# Patient Record
Sex: Female | Born: 1950 | Race: Black or African American | Hispanic: No | Marital: Married | Smoking: Former smoker
Health system: Southern US, Community
[De-identification: ages and names within clinical notes are randomized; demographics above are authoritative.]

## PROBLEM LIST (undated history)

## (undated) DIAGNOSIS — Z8619 Personal history of other infectious and parasitic diseases: Secondary | ICD-10-CM

## (undated) DIAGNOSIS — K759 Inflammatory liver disease, unspecified: Secondary | ICD-10-CM

## (undated) DIAGNOSIS — Z8679 Personal history of other diseases of the circulatory system: Secondary | ICD-10-CM

## (undated) DIAGNOSIS — N320 Bladder-neck obstruction: Secondary | ICD-10-CM

## (undated) DIAGNOSIS — B182 Chronic viral hepatitis C: Secondary | ICD-10-CM

## (undated) DIAGNOSIS — K08109 Complete loss of teeth, unspecified cause, unspecified class: Secondary | ICD-10-CM

## (undated) DIAGNOSIS — I2510 Atherosclerotic heart disease of native coronary artery without angina pectoris: Secondary | ICD-10-CM

## (undated) DIAGNOSIS — E785 Hyperlipidemia, unspecified: Secondary | ICD-10-CM

## (undated) DIAGNOSIS — D509 Iron deficiency anemia, unspecified: Secondary | ICD-10-CM

## (undated) DIAGNOSIS — I739 Peripheral vascular disease, unspecified: Secondary | ICD-10-CM

## (undated) DIAGNOSIS — Z789 Other specified health status: Secondary | ICD-10-CM

## (undated) DIAGNOSIS — M1990 Unspecified osteoarthritis, unspecified site: Secondary | ICD-10-CM

## (undated) DIAGNOSIS — N3281 Overactive bladder: Secondary | ICD-10-CM

## (undated) DIAGNOSIS — E119 Type 2 diabetes mellitus without complications: Secondary | ICD-10-CM

## (undated) DIAGNOSIS — K219 Gastro-esophageal reflux disease without esophagitis: Secondary | ICD-10-CM

## (undated) DIAGNOSIS — N3289 Other specified disorders of bladder: Secondary | ICD-10-CM

## (undated) DIAGNOSIS — N952 Postmenopausal atrophic vaginitis: Secondary | ICD-10-CM

## (undated) DIAGNOSIS — I6529 Occlusion and stenosis of unspecified carotid artery: Secondary | ICD-10-CM

## (undated) DIAGNOSIS — K7460 Unspecified cirrhosis of liver: Secondary | ICD-10-CM

## (undated) DIAGNOSIS — D259 Leiomyoma of uterus, unspecified: Secondary | ICD-10-CM

## (undated) DIAGNOSIS — R32 Unspecified urinary incontinence: Secondary | ICD-10-CM

## (undated) DIAGNOSIS — I8500 Esophageal varices without bleeding: Secondary | ICD-10-CM

## (undated) DIAGNOSIS — I6523 Occlusion and stenosis of bilateral carotid arteries: Secondary | ICD-10-CM

## (undated) DIAGNOSIS — I10 Essential (primary) hypertension: Secondary | ICD-10-CM

## (undated) DIAGNOSIS — Z5189 Encounter for other specified aftercare: Secondary | ICD-10-CM

## (undated) DIAGNOSIS — I639 Cerebral infarction, unspecified: Secondary | ICD-10-CM

## (undated) DIAGNOSIS — D696 Thrombocytopenia, unspecified: Secondary | ICD-10-CM

## (undated) DIAGNOSIS — D649 Anemia, unspecified: Secondary | ICD-10-CM

## (undated) HISTORY — DX: Gastro-esophageal reflux disease without esophagitis: K21.9

## (undated) HISTORY — DX: Unspecified urinary incontinence: R32

## (undated) HISTORY — DX: Anemia, unspecified: D64.9

## (undated) HISTORY — DX: Leiomyoma of uterus, unspecified: D25.9

## (undated) HISTORY — DX: Thrombocytopenia, unspecified: D69.6

## (undated) HISTORY — DX: Unspecified cirrhosis of liver: K74.60

## (undated) HISTORY — PX: COLONOSCOPY: SHX174

## (undated) HISTORY — DX: Cerebral infarction, unspecified: I63.9

## (undated) HISTORY — DX: Hyperlipidemia, unspecified: E78.5

## (undated) HISTORY — PX: UPPER GASTROINTESTINAL ENDOSCOPY: SHX188

## (undated) HISTORY — DX: Encounter for other specified aftercare: Z51.89

## (undated) HISTORY — DX: Occlusion and stenosis of unspecified carotid artery: I65.29

---

## 1997-06-07 HISTORY — PX: ANKLE FRACTURE SURGERY: SHX122

## 2002-06-09 ENCOUNTER — Ambulatory Visit (HOSPITAL_COMMUNITY): Admission: RE | Admit: 2002-06-09 | Discharge: 2002-06-09 | Payer: Self-pay

## 2002-08-22 ENCOUNTER — Ambulatory Visit (HOSPITAL_COMMUNITY): Admission: RE | Admit: 2002-08-22 | Discharge: 2002-08-22 | Payer: Self-pay | Admitting: Family Medicine

## 2005-01-14 ENCOUNTER — Ambulatory Visit: Payer: Self-pay | Admitting: Nurse Practitioner

## 2005-03-01 ENCOUNTER — Ambulatory Visit: Payer: Self-pay | Admitting: Nurse Practitioner

## 2005-03-16 ENCOUNTER — Ambulatory Visit: Payer: Self-pay | Admitting: *Deleted

## 2005-06-10 ENCOUNTER — Ambulatory Visit: Payer: Self-pay | Admitting: Nurse Practitioner

## 2005-06-10 ENCOUNTER — Other Ambulatory Visit: Admission: RE | Admit: 2005-06-10 | Discharge: 2005-06-10 | Payer: Self-pay | Admitting: Family Medicine

## 2005-06-10 ENCOUNTER — Encounter (INDEPENDENT_AMBULATORY_CARE_PROVIDER_SITE_OTHER): Payer: Self-pay | Admitting: *Deleted

## 2005-06-23 ENCOUNTER — Ambulatory Visit: Payer: Self-pay | Admitting: Nurse Practitioner

## 2006-08-01 ENCOUNTER — Ambulatory Visit: Payer: Self-pay | Admitting: Family Medicine

## 2006-08-18 ENCOUNTER — Ambulatory Visit: Payer: Self-pay | Admitting: Family Medicine

## 2006-09-01 ENCOUNTER — Ambulatory Visit: Payer: Self-pay | Admitting: Family Medicine

## 2006-10-19 ENCOUNTER — Ambulatory Visit: Payer: Self-pay | Admitting: Nurse Practitioner

## 2006-12-20 ENCOUNTER — Ambulatory Visit: Payer: Self-pay | Admitting: Family Medicine

## 2007-03-02 ENCOUNTER — Ambulatory Visit: Payer: Self-pay | Admitting: Nurse Practitioner

## 2007-03-02 ENCOUNTER — Encounter: Payer: Self-pay | Admitting: Internal Medicine

## 2007-03-02 LAB — CONVERTED CEMR LAB
ALT: 38 units/L — ABNORMAL HIGH (ref 0–35)
AST: 38 units/L — ABNORMAL HIGH (ref 0–37)
Albumin: 4.3 g/dL (ref 3.5–5.2)
Alkaline Phosphatase: 80 units/L (ref 39–117)
BUN: 22 mg/dL (ref 6–23)
Basophils Absolute: 0 10*3/uL (ref 0.0–0.1)
Basophils Relative: 0 % (ref 0–1)
CO2: 24 meq/L (ref 19–32)
Calcium: 9.7 mg/dL (ref 8.4–10.5)
Chloride: 105 meq/L (ref 96–112)
Creatinine, Ser: 0.79 mg/dL (ref 0.40–1.20)
Eosinophils Absolute: 0 10*3/uL (ref 0.0–0.7)
Eosinophils Relative: 1 % (ref 0–5)
Glucose, Bld: 112 mg/dL — ABNORMAL HIGH (ref 70–99)
HCT: 37.4 % (ref 36.0–46.0)
Hemoglobin: 12 g/dL (ref 12.0–15.0)
Lymphocytes Relative: 37 % (ref 12–46)
Lymphs Abs: 2.6 10*3/uL (ref 0.7–3.3)
MCHC: 32.1 g/dL (ref 30.0–36.0)
MCV: 71.1 fL — ABNORMAL LOW (ref 78.0–100.0)
Monocytes Absolute: 0.6 10*3/uL (ref 0.2–0.7)
Monocytes Relative: 9 % (ref 3–11)
Neutro Abs: 3.6 10*3/uL (ref 1.7–7.7)
Neutrophils Relative %: 53 % (ref 43–77)
Platelets: 203 10*3/uL (ref 150–400)
Potassium: 3.9 meq/L (ref 3.5–5.3)
RBC: 5.26 M/uL — ABNORMAL HIGH (ref 3.87–5.11)
RDW: 15.3 % — ABNORMAL HIGH (ref 11.5–14.0)
Sodium: 140 meq/L (ref 135–145)
Total Bilirubin: 0.7 mg/dL (ref 0.3–1.2)
Total Protein: 7.9 g/dL (ref 6.0–8.3)
WBC: 6.9 10*3/uL (ref 4.0–10.5)

## 2007-04-19 ENCOUNTER — Ambulatory Visit: Payer: Self-pay | Admitting: Family Medicine

## 2007-05-22 ENCOUNTER — Ambulatory Visit: Payer: Self-pay | Admitting: Internal Medicine

## 2007-08-01 ENCOUNTER — Ambulatory Visit: Payer: Self-pay | Admitting: Family Medicine

## 2007-08-01 ENCOUNTER — Encounter (INDEPENDENT_AMBULATORY_CARE_PROVIDER_SITE_OTHER): Payer: Self-pay | Admitting: Nurse Practitioner

## 2007-08-01 LAB — CONVERTED CEMR LAB
ALT: 31 units/L (ref 0–35)
AST: 26 units/L (ref 0–37)
Albumin: 4.3 g/dL (ref 3.5–5.2)
Alkaline Phosphatase: 71 units/L (ref 39–117)
BUN: 15 mg/dL (ref 6–23)
CO2: 24 meq/L (ref 19–32)
Calcium: 9.4 mg/dL (ref 8.4–10.5)
Chloride: 106 meq/L (ref 96–112)
Creatinine, Ser: 0.8 mg/dL (ref 0.40–1.20)
Glucose, Bld: 86 mg/dL (ref 70–99)
Potassium: 3.8 meq/L (ref 3.5–5.3)
RPR Ser Ql: REACTIVE — AB
RPR Titer: 1:8 {titer}
Sodium: 141 meq/L (ref 135–145)
Total Bilirubin: 0.6 mg/dL (ref 0.3–1.2)
Total Protein: 7.4 g/dL (ref 6.0–8.3)

## 2007-08-10 ENCOUNTER — Ambulatory Visit: Payer: Self-pay | Admitting: Internal Medicine

## 2007-08-14 ENCOUNTER — Ambulatory Visit: Payer: Self-pay | Admitting: Internal Medicine

## 2007-08-20 ENCOUNTER — Emergency Department (HOSPITAL_COMMUNITY): Admission: EM | Admit: 2007-08-20 | Discharge: 2007-08-20 | Payer: Self-pay | Admitting: Emergency Medicine

## 2007-08-25 ENCOUNTER — Ambulatory Visit: Payer: Self-pay | Admitting: Internal Medicine

## 2007-09-19 ENCOUNTER — Ambulatory Visit: Payer: Self-pay | Admitting: Internal Medicine

## 2007-09-19 ENCOUNTER — Encounter (INDEPENDENT_AMBULATORY_CARE_PROVIDER_SITE_OTHER): Payer: Self-pay | Admitting: Nurse Practitioner

## 2007-09-25 ENCOUNTER — Ambulatory Visit: Payer: Self-pay | Admitting: Internal Medicine

## 2007-11-25 ENCOUNTER — Inpatient Hospital Stay (HOSPITAL_COMMUNITY): Admission: EM | Admit: 2007-11-25 | Discharge: 2007-11-28 | Payer: Self-pay | Admitting: Emergency Medicine

## 2007-11-25 HISTORY — PX: ORIF ANKLE FRACTURE: SUR919

## 2008-04-01 ENCOUNTER — Ambulatory Visit: Payer: Self-pay | Admitting: Internal Medicine

## 2008-04-18 ENCOUNTER — Ambulatory Visit: Payer: Self-pay | Admitting: Internal Medicine

## 2008-05-18 ENCOUNTER — Emergency Department (HOSPITAL_COMMUNITY): Admission: EM | Admit: 2008-05-18 | Discharge: 2008-05-18 | Payer: Self-pay | Admitting: Emergency Medicine

## 2008-06-28 ENCOUNTER — Encounter: Admission: RE | Admit: 2008-06-28 | Discharge: 2008-06-28 | Payer: Self-pay | Admitting: Chiropractic Medicine

## 2008-07-11 ENCOUNTER — Ambulatory Visit: Payer: Self-pay | Admitting: Internal Medicine

## 2008-07-11 ENCOUNTER — Encounter (INDEPENDENT_AMBULATORY_CARE_PROVIDER_SITE_OTHER): Payer: Self-pay | Admitting: Internal Medicine

## 2008-07-11 LAB — CONVERTED CEMR LAB
ALT: 52 units/L — ABNORMAL HIGH (ref 0–35)
AST: 73 units/L — ABNORMAL HIGH (ref 0–37)
Albumin: 4.4 g/dL (ref 3.5–5.2)
Alkaline Phosphatase: 86 units/L (ref 39–117)
BUN: 12 mg/dL (ref 6–23)
CO2: 22 meq/L (ref 19–32)
Calcium: 9.5 mg/dL (ref 8.4–10.5)
Chloride: 106 meq/L (ref 96–112)
Creatinine, Ser: 0.82 mg/dL (ref 0.40–1.20)
Glucose, Bld: 91 mg/dL (ref 70–99)
Potassium: 4.3 meq/L (ref 3.5–5.3)
Sodium: 143 meq/L (ref 135–145)
Total Bilirubin: 0.9 mg/dL (ref 0.3–1.2)
Total Protein: 8.5 g/dL — ABNORMAL HIGH (ref 6.0–8.3)

## 2008-07-17 ENCOUNTER — Encounter (INDEPENDENT_AMBULATORY_CARE_PROVIDER_SITE_OTHER): Payer: Self-pay | Admitting: Internal Medicine

## 2008-07-17 LAB — CONVERTED CEMR LAB
HCV Ab: REACTIVE — AB
Hep A Total Ab: NEGATIVE
Hep B Core Total Ab: NEGATIVE
Hep B E Ab: NEGATIVE
Hep B S Ab: NEGATIVE

## 2008-07-30 ENCOUNTER — Ambulatory Visit: Payer: Self-pay | Admitting: Internal Medicine

## 2008-07-30 LAB — CONVERTED CEMR LAB
RPR Ser Ql: REACTIVE — AB
RPR Titer: 1:2 {titer}

## 2008-08-06 ENCOUNTER — Ambulatory Visit (HOSPITAL_COMMUNITY): Admission: RE | Admit: 2008-08-06 | Discharge: 2008-08-06 | Payer: Self-pay | Admitting: Internal Medicine

## 2008-08-28 ENCOUNTER — Ambulatory Visit: Payer: Self-pay | Admitting: Internal Medicine

## 2008-10-09 ENCOUNTER — Ambulatory Visit (HOSPITAL_COMMUNITY): Admission: RE | Admit: 2008-10-09 | Discharge: 2008-10-09 | Payer: Self-pay | Admitting: Internal Medicine

## 2008-11-19 ENCOUNTER — Ambulatory Visit: Payer: Self-pay | Admitting: Family Medicine

## 2008-11-27 ENCOUNTER — Ambulatory Visit: Payer: Self-pay | Admitting: Internal Medicine

## 2009-01-13 ENCOUNTER — Encounter: Admission: RE | Admit: 2009-01-13 | Discharge: 2009-02-26 | Payer: Self-pay | Admitting: Internal Medicine

## 2009-02-06 ENCOUNTER — Telehealth (INDEPENDENT_AMBULATORY_CARE_PROVIDER_SITE_OTHER): Payer: Self-pay | Admitting: *Deleted

## 2009-02-13 ENCOUNTER — Ambulatory Visit: Payer: Self-pay | Admitting: Internal Medicine

## 2009-02-14 ENCOUNTER — Encounter (INDEPENDENT_AMBULATORY_CARE_PROVIDER_SITE_OTHER): Payer: Self-pay | Admitting: Adult Health

## 2009-02-24 ENCOUNTER — Telehealth (INDEPENDENT_AMBULATORY_CARE_PROVIDER_SITE_OTHER): Payer: Self-pay | Admitting: *Deleted

## 2009-02-26 ENCOUNTER — Emergency Department (HOSPITAL_COMMUNITY): Admission: EM | Admit: 2009-02-26 | Discharge: 2009-02-26 | Payer: Self-pay | Admitting: Emergency Medicine

## 2009-02-27 ENCOUNTER — Ambulatory Visit: Payer: Self-pay | Admitting: Internal Medicine

## 2009-02-28 ENCOUNTER — Encounter (INDEPENDENT_AMBULATORY_CARE_PROVIDER_SITE_OTHER): Payer: Self-pay | Admitting: Internal Medicine

## 2009-05-29 ENCOUNTER — Other Ambulatory Visit: Admission: RE | Admit: 2009-05-29 | Discharge: 2009-05-29 | Payer: Self-pay | Admitting: Family Medicine

## 2009-05-29 ENCOUNTER — Ambulatory Visit: Payer: Self-pay | Admitting: Family Medicine

## 2009-05-30 ENCOUNTER — Encounter (INDEPENDENT_AMBULATORY_CARE_PROVIDER_SITE_OTHER): Payer: Self-pay | Admitting: Family Medicine

## 2009-05-31 ENCOUNTER — Emergency Department (HOSPITAL_COMMUNITY): Admission: EM | Admit: 2009-05-31 | Discharge: 2009-05-31 | Payer: Self-pay | Admitting: Emergency Medicine

## 2009-09-30 ENCOUNTER — Emergency Department (HOSPITAL_COMMUNITY): Admission: EM | Admit: 2009-09-30 | Discharge: 2009-09-30 | Payer: Self-pay | Admitting: Emergency Medicine

## 2010-05-06 ENCOUNTER — Encounter (INDEPENDENT_AMBULATORY_CARE_PROVIDER_SITE_OTHER): Payer: Self-pay | Admitting: Family Medicine

## 2010-07-07 NOTE — Progress Notes (Signed)
Summary: triage-vaginal itching  Phone Note Call from Patient   Reason for Call: Talk to Nurse Summary of Call: patient in acute visit last week with vaginal discharge burning...provider instructions f/u 1 week if not improved...patient states she has finished her medications and still burning and itching with increased discharge...patient scheduled today with Fannie Knee Drinkard...patient states she has culture and sensitivity done on urine... Initial call taken by: Conchita Paris,  February 24, 2009 10:04 AM

## 2010-07-07 NOTE — Progress Notes (Signed)
Summary: vaginal infection/UTI  Phone Note Call from Patient   Caller: Patient Reason for Call: Talk to Nurse Summary of Call: patient left message on voice mail aout 30 minutes ago....states thinks she has vaginal infection and also burning with urination...patient states she wants to come in.[name] KitchenMarland KitchenMarland KitchenI called back a woman answered the phone saying Nyesha is not there. I asked to have her call me back....the number she left to call was [phone] Initial call taken by: Conchita Paris,  February 06, 2009 10:45 AM  Follow-up for Phone Call        Patient states burning with urination and white vaginal discharge...no blood seen... Follow-up by: Conchita Paris,  February 06, 2009 5:07 PM

## 2010-08-25 LAB — URINE MICROSCOPIC-ADD ON

## 2010-08-25 LAB — URINALYSIS, ROUTINE W REFLEX MICROSCOPIC
Bilirubin Urine: NEGATIVE
Glucose, UA: NEGATIVE mg/dL
Ketones, ur: NEGATIVE mg/dL
Nitrite: NEGATIVE
Protein, ur: 100 mg/dL — AB
Specific Gravity, Urine: 1.02 (ref 1.005–1.030)
Urobilinogen, UA: 1 mg/dL (ref 0.0–1.0)
pH: 5.5 (ref 5.0–8.0)

## 2010-09-07 LAB — URINE MICROSCOPIC-ADD ON

## 2010-09-07 LAB — URINALYSIS, ROUTINE W REFLEX MICROSCOPIC
Bilirubin Urine: NEGATIVE
Glucose, UA: NEGATIVE mg/dL
Ketones, ur: NEGATIVE mg/dL
Nitrite: NEGATIVE
Protein, ur: >300 mg/dL — AB
Specific Gravity, Urine: 1.027 (ref 1.005–1.030)
Urobilinogen, UA: 1 mg/dL (ref 0.0–1.0)
pH: 6.5 (ref 5.0–8.0)

## 2010-09-26 ENCOUNTER — Emergency Department (HOSPITAL_COMMUNITY): Payer: Medicaid Other

## 2010-09-26 ENCOUNTER — Emergency Department (HOSPITAL_COMMUNITY)
Admission: EM | Admit: 2010-09-26 | Discharge: 2010-09-26 | Disposition: A | Discharge status: Home / Self Care | Payer: Medicaid Other | Attending: Emergency Medicine | Admitting: Emergency Medicine

## 2010-09-26 DIAGNOSIS — Z79899 Other long term (current) drug therapy: Secondary | ICD-10-CM | POA: Insufficient documentation

## 2010-09-26 DIAGNOSIS — I10 Essential (primary) hypertension: Secondary | ICD-10-CM | POA: Insufficient documentation

## 2010-09-26 DIAGNOSIS — M25579 Pain in unspecified ankle and joints of unspecified foot: Secondary | ICD-10-CM | POA: Insufficient documentation

## 2010-09-26 DIAGNOSIS — M545 Low back pain, unspecified: Secondary | ICD-10-CM | POA: Insufficient documentation

## 2010-09-27 ENCOUNTER — Inpatient Hospital Stay (INDEPENDENT_AMBULATORY_CARE_PROVIDER_SITE_OTHER)
Admission: RE | Admit: 2010-09-27 | Discharge: 2010-09-27 | Disposition: A | Discharge status: Home / Self Care | Payer: Medicaid Other | Source: Ambulatory Visit | Attending: Family Medicine | Admitting: Family Medicine

## 2010-09-27 DIAGNOSIS — M62838 Other muscle spasm: Secondary | ICD-10-CM

## 2010-09-27 DIAGNOSIS — M461 Sacroiliitis, not elsewhere classified: Secondary | ICD-10-CM

## 2010-09-27 LAB — POCT URINALYSIS DIP (DEVICE)
Glucose, UA: NEGATIVE mg/dL
Hgb urine dipstick: NEGATIVE
Ketones, ur: NEGATIVE mg/dL
Nitrite: NEGATIVE
Protein, ur: 30 mg/dL — AB
Specific Gravity, Urine: 1.015 (ref 1.005–1.030)
Urobilinogen, UA: 2 mg/dL — ABNORMAL HIGH (ref 0.0–1.0)
pH: 7 (ref 5.0–8.0)

## 2010-10-20 NOTE — H&P (Signed)
NAMEALLANNA, BRESEE NO.:  [id number]   MEDICAL RECORD NO.:  [id number]          PATIENT TYPE:  INP   LOCATION:  5008                         FACILITY:  [facility]   PHYSICIAN:  Eulas Post, MD    DATE OF BIRTH:  [birth year]   DATE OF ADMISSION:  11/25/2007  DATE OF DISCHARGE:                              HISTORY & PHYSICAL   CHIEF COMPLAINTS:  Right ankle pain.   HISTORY:  Deborah Wise is a 60 year old woman who twisted and fell  while stepping off the curve and felt a pop and severe ankle pain on the  right side.  She denies other injuries.  She rates the pain as a 1/10.  She cannot bear weight.  She denies associated other problems.  She says  that a number of years ago she twisted both of her ankles and had pain,  but never went to a doctor and things seemed to get better.  She noted  acute deformity as well as swelling at the ankle on today's episode.  The date of her injury was November 25, 2007.   PAST MEDICAL HISTORY:  She denies any other medical problems.   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   FAMILY HISTORY:  Positive for diabetes in her mother.   REVIEW OF SYSTEMS:  Complete review of systems was performed and was  otherwise negative except for the musculoskeletal complaints as above.   PHYSICAL EXAMINATION:  GENERAL:  She is sitting on a gurney, in no acute  distress.  HEENT:  Her head is normocephalic and atraumatic.  NECK:  She has full range of motion with no radiculopathy and a midline  trachea.  She has no masses.  CARDIOVASCULAR:  She has good peripheral pulses and no edema in her left  leg.  RESPIRATORY:  She is clear to auscultation and she does not have any  cyanosis.  GI:  She has no organomegaly.  LYMPHATIC:  Her neck and axilla are without lymphadenopathy.  PSYCHIATRIC:  Her mood and judgment are appropriate and her insight  seems to be intact.  NEUROLOGIC:  Sensation is intact throughout her  foot.  SKIN:  She has no skin breaks around the right lower extremity.  MUSCULOSKELETAL:  She has swelling and moderate deformity of the right  lower extremity.  She has ecchymosis.  She cannot bear weight.  Right  upper extremity and left upper extremity and left lower extremity are  atraumatic.   Her x-rays demonstrate evidence for an extra-articular distal tibia  spiral oblique fracture with a fibular fracture that is also spiral and  oblique, but down more towards the mortise.  There is also a posterior  malleolus fracture that I believe is separate from the distal tibia  fracture that has minimal displacement.  Overall, the fracture is  shortened and has moderate displacement.   IMPRESSION:  1. Distal-third spiral oblique extra-articular tibia fracture.  2. Distal fibula fracture.  3. Posterior malleolus fracture.   PLAN:  Given the presence of the ankle fracture as well as the degree of  shortening, I am reluctant to recommend closed treatment for her complex  distal leg fracture.  Therefore, I have recommended open reduction and  internal fixation of the fibula.  The posterior malleolus piece is  relatively small and will not likely need fixation.  We will have to  reassess this at the time after the fibula has been fixed.  The tibia  fracture I think would be best treated with an intramedullary nail.  I  an concerned given the degree of soft tissue trauma that will be  incurred by the fibula ORIF, and I am elected to make a second incision  to plate the distal tibia, however, if need be I certainly will do that.  My first plan, however, would be to treat her tibia with an  intramedullary nail as this would preserve her soft tissue distally in  the best way possible.  I will plan to proceed to the operating room as  soon as possible and she is currently n.p.o.  The risks, benefits, and  alternatives including the possibility of cast treatment were discussed  with her, and she  is willing to proceed.  We will plan to admit her for  urgent surgery.      Eulas Post, MD  Electronically Signed     JPL/MEDQ  D:  11/25/2007  T:  11/26/2007  Job:  [id number]

## 2010-10-20 NOTE — Op Note (Signed)
Deborah Wise, Deborah Wise NO.:  [id number]   MEDICAL RECORD NO.:  [id number]          PATIENT TYPE:  INP   LOCATION:  5008                         FACILITY:  [facility]   PHYSICIAN:  Eulas Post, MD    DATE OF BIRTH:  [birth year]   DATE OF PROCEDURE:  11/25/2007  DATE OF DISCHARGE:  08/20/2007                               OPERATIVE REPORT   ATTENDING PHYSICIAN:  Eulas Post, MD   ASSISTANT:  None.   PREOPERATIVE DIAGNOSES:  1. Right tibia fracture.  2. His right distal fibula fracture with a posterior malleolus ankle      fracture.   OPERATIVE PROCEDURE:  1. Right tibia intramedullary nailing.  2. Right open reduction and internal fixation of bimalleolar ankle      fracture.   ANESTHESIA:  General.   TOURNIQUET TIME:  2 hours.   ANTIBIOTICS:  1 gram intravenous Ancef.   OPERATIVE IMPLANTS:  We used a Synthes one-third tubular plate size 6-  hole with 2 proximal bicortical screws, with 1 interfragmentary lag  screw and 3 distal cancellous screws.  We also used a size 300 and 30 x  9 mm tibial nail Synthes EX with 1 proximal static screw, 1 proximal  dynamic screw and 2 distal locking screws, 1 bicortically through the  distal segment and 1 across the fracture site catching both the distal  and proximal segment with the screw   PREOPERATIVE INDICATIONS:  Deborah Wise is a 60 year old woman  who had a low velocity twisting type injury and broke her ankle as well  as her distal third tibia.  She elected to undergo the above named  procedures.  The risks, benefits and alternatives were discussed with  her preoperatively including, but not limited to risks of infection,  bleeding, nerve injury, malunion, nonunion, hardware prominence,  hardware failure, need for hardware removal, postoperative knee pain,  malunion, rotational malalignment, blood clots, cardiopulmonary  complications, among others and she is willing to proceed.  We  specifically  discussed cast treatment versus operative treatment and  elected for operative treatment.  I felt that given that she had an  ankle fracture in combination with the tibia fracture that she had a  unstable extremity and operative fixation would greatly benefit her  postoperative recovery and reduce the risks of subsequent extremity  problems.   OPERATIVE PROCEDURE:  The patient was brought to the operating room, and  placed in supine position.  General anesthesia was administered.  1 gram  intravenous Ancef was given.  The right lower extremity was prepped and  draped in the usual sterile fashion.  The leg was elevated,  exsanguinated, and tourniquet was inflated.  Lateral approach to the  fibula was performed.  Anatomic reduction was held with clamps.  We then  placed a lag screw from front to back with excellent fixation.  We then  placed our plate and had excellent fixation.  The bone quality was  overall reasonable although not grade, but it was okay.  Once  satisfactory position and stability of the fibula was achieved, we then  turned our attention  to the tibia.   Incision was made over the patella and a medial parapatellar approach  was performed.  Guidepin was placed and confirmed under fluoroscopy.  The proximal tibia was opened.  We then passed a guidewire down and  reduce the fracture anatomically and then reamed sequentially to a 10.  Excellent chatter was achieved.  We selected a 9.  The appropriate  length was chosen.  We then placed the nail down and confirmed final  position under AP and lateral views.  We secured our nail proximally and  then distally as well.  Because of the oblique nature the fracture, we  did not apply compression.  We held the fracture reduced with a clamp  while all of the procedure was performed.  This was done percutaneously.  We then interlocked our screws distally from medial to lateral.  We  selected both medial and lateral screws, and the most  proximal screw was  actually crossing the fracture site and provided an interfragmentary  fixation which would help control with rotation as well as provide added  stability across the fracture site.  We had considered placing an  anterior to posterior screw; however, because of the nature the  fracture, this would also only have given a unicortical fixation at the  anterior cortex, and therefore would not provide added benefit.  Therefore, we maintained our distal fixation with the medial collateral  screws.   All the wounds were irrigated copiously and the deep tissue closed with  0 Vicryl followed by 3-0 followed by Monocryl and Steri-Strips for the  skin.  The patient was awakened and returned to PACU in stable  satisfactory condition.  The posterior splint was also applied and final  full length x-rays were taken in the room.  The patient tolerated the  procedure well.  No known complications.      Eulas Post, MD  Electronically Signed     JPL/MEDQ  D:  11/25/2007  T:  11/26/2007  Job:  [id number]

## 2010-10-20 NOTE — Discharge Summary (Signed)
NAMEERCELL, PERLMAN NO.:  [id number]   MEDICAL RECORD NO.:  [id number]          PATIENT TYPE:  INP   LOCATION:  5008                         FACILITY:  [facility]   PHYSICIAN:  Eulas Post, MD    DATE OF BIRTH:  Jan 11, 1951   DATE OF ADMISSION:  11/25/2007  DATE OF DISCHARGE:  11/28/2007                               DISCHARGE SUMMARY   ADMISSION DIAGNOSES:  Right tibia and fibula fracture.   DISCHARGE DIAGNOSES:  1. Right tibia and fibula fracture  2. Bimalleolar ankle fracture.   PRIMARY PROCEDURE:  Right tibia intramedullary nailing and right fibula  open reduction and internal fixation.   DISCHARGE MEDICATIONS:  1. Aspirin 325 mg p.o. daily for 30 days.  2. Percocet.  3. Valium.  4. Phenergan.   HOSPITAL COURSE:  Deborah Wise is a 60 year old woman who had a  twisting injury and broke her tibia and had a bimalleolar ankle  fracture.  She underwent open reduction and internal fixation and  intramedullary nailing.  She tolerated the procedure well and  postoperatively, had no complications.  She was given Lovenox while in  the hospital as well as compression devices for DVT prophylaxis.  She  was given perioperative antibiotics.  She was given a PCA and  subsequently p.o. analgesics.  She worked with physical therapy and was  making good progress.  She is planned to be discharge home, non-  weightbearing on the right lower extremity, and we will see her back in  2 weeks and get new x-rays out of plaster.  She has done quite well.      Eulas Post, MD  Electronically Signed     JPL/MEDQ  D:  11/28/2007  T:  11/28/2007  Job:  [id number]

## 2011-03-04 LAB — CBC
HCT: 29.8 — ABNORMAL LOW
HCT: 30.6 — ABNORMAL LOW
HCT: 31.5 — ABNORMAL LOW
Hemoglobin: 10.2 — ABNORMAL LOW
Hemoglobin: 9.6 — ABNORMAL LOW
Hemoglobin: 9.8 — ABNORMAL LOW
MCHC: 32
MCHC: 32.2
MCHC: 32.3
MCV: 73.4 — ABNORMAL LOW
MCV: 73.5 — ABNORMAL LOW
MCV: 73.5 — ABNORMAL LOW
Platelets: 143 — ABNORMAL LOW
Platelets: 152
Platelets: 156
RBC: 4.05
RBC: 4.16
RBC: 4.29
RDW: 15.4
RDW: 15.4
RDW: 15.5
WBC: 5
WBC: 6.3
WBC: 6.6

## 2011-03-04 LAB — BASIC METABOLIC PANEL
BUN: 2 — ABNORMAL LOW
BUN: 4 — ABNORMAL LOW
BUN: 8
CO2: 29
CO2: 31
CO2: 31
Calcium: 8.6
Calcium: 8.9
Calcium: 9.2
Chloride: 102
Chloride: 104
Chloride: 106
Creatinine, Ser: 0.67
Creatinine, Ser: 0.69
Creatinine, Ser: 0.72
GFR calc Af Amer: >60
GFR calc Af Amer: >60
GFR calc Af Amer: >60
GFR calc non Af Amer: >60
GFR calc non Af Amer: >60
GFR calc non Af Amer: >60
Glucose, Bld: 111 — ABNORMAL HIGH
Glucose, Bld: 118 — ABNORMAL HIGH
Glucose, Bld: 94
Potassium: 3.9
Potassium: 3.9
Potassium: 4.2
Sodium: 138
Sodium: 140
Sodium: 141

## 2011-03-04 LAB — POCT I-STAT, CHEM 8
BUN: 18
Calcium, Ion: 1.13
Chloride: 110
Creatinine, Ser: 1
Glucose, Bld: 106 — ABNORMAL HIGH
HCT: 38
Hemoglobin: 12.9
Potassium: 3.8
Sodium: 143
TCO2: 22

## 2011-03-04 LAB — PROTIME-INR
INR: 0.9
INR: 0.9
INR: 0.9
Prothrombin Time: 12.6
Prothrombin Time: 12.7
Prothrombin Time: 12.8

## 2011-08-25 ENCOUNTER — Other Ambulatory Visit: Payer: Self-pay | Admitting: Emergency Medicine

## 2011-08-25 DIAGNOSIS — Z1231 Encounter for screening mammogram for malignant neoplasm of breast: Secondary | ICD-10-CM

## 2011-09-03 ENCOUNTER — Ambulatory Visit
Admission: RE | Admit: 2011-09-03 | Discharge: 2011-09-03 | Disposition: A | Discharge status: Home / Self Care | Payer: Medicaid Other | Source: Ambulatory Visit | Attending: Emergency Medicine | Admitting: Emergency Medicine

## 2011-09-03 DIAGNOSIS — Z1231 Encounter for screening mammogram for malignant neoplasm of breast: Secondary | ICD-10-CM

## 2011-09-07 ENCOUNTER — Other Ambulatory Visit: Payer: Self-pay | Admitting: Emergency Medicine

## 2011-09-07 DIAGNOSIS — R928 Other abnormal and inconclusive findings on diagnostic imaging of breast: Secondary | ICD-10-CM

## 2011-09-15 ENCOUNTER — Other Ambulatory Visit: Payer: Medicaid Other

## 2011-09-22 ENCOUNTER — Ambulatory Visit
Admission: RE | Admit: 2011-09-22 | Discharge: 2011-09-22 | Disposition: A | Discharge status: Home / Self Care | Payer: Medicaid Other | Source: Ambulatory Visit | Attending: Emergency Medicine | Admitting: Emergency Medicine

## 2011-09-22 ENCOUNTER — Other Ambulatory Visit: Payer: Self-pay | Admitting: Emergency Medicine

## 2011-09-22 DIAGNOSIS — R928 Other abnormal and inconclusive findings on diagnostic imaging of breast: Secondary | ICD-10-CM

## 2011-09-28 ENCOUNTER — Ambulatory Visit
Admission: RE | Admit: 2011-09-28 | Discharge: 2011-09-28 | Disposition: A | Discharge status: Home / Self Care | Payer: Medicaid Other | Source: Ambulatory Visit | Attending: Emergency Medicine | Admitting: Emergency Medicine

## 2011-09-28 DIAGNOSIS — R928 Other abnormal and inconclusive findings on diagnostic imaging of breast: Secondary | ICD-10-CM

## 2011-10-12 ENCOUNTER — Encounter (HOSPITAL_COMMUNITY): Payer: Self-pay | Admitting: Pharmacy Technician

## 2011-10-14 ENCOUNTER — Other Ambulatory Visit: Payer: Self-pay | Admitting: Emergency Medicine

## 2011-10-14 DIAGNOSIS — N644 Mastodynia: Secondary | ICD-10-CM

## 2011-10-19 ENCOUNTER — Encounter (HOSPITAL_COMMUNITY)
Admission: RE | Admit: 2011-10-19 | Discharge: 2011-10-19 | Disposition: A | Discharge status: Home / Self Care | Payer: Medicaid Other | Source: Ambulatory Visit | Attending: Anesthesiology | Admitting: Anesthesiology

## 2011-10-19 ENCOUNTER — Encounter (HOSPITAL_COMMUNITY)
Admission: RE | Admit: 2011-10-19 | Discharge: 2011-10-19 | Disposition: A | Discharge status: Home / Self Care | Payer: Medicaid Other | Source: Ambulatory Visit | Attending: Oral Surgery | Admitting: Oral Surgery

## 2011-10-19 ENCOUNTER — Encounter (HOSPITAL_COMMUNITY): Payer: Self-pay

## 2011-10-19 ENCOUNTER — Ambulatory Visit (HOSPITAL_COMMUNITY): Admission: RE | Admit: 2011-10-19 | Payer: Medicaid Other | Source: Ambulatory Visit

## 2011-10-19 ENCOUNTER — Other Ambulatory Visit (HOSPITAL_BASED_OUTPATIENT_CLINIC_OR_DEPARTMENT_OTHER): Payer: Self-pay | Admitting: Surgery

## 2011-10-19 HISTORY — DX: Unspecified osteoarthritis, unspecified site: M19.90

## 2011-10-19 HISTORY — DX: Inflammatory liver disease, unspecified: K75.9

## 2011-10-19 HISTORY — DX: Essential (primary) hypertension: I10

## 2011-10-19 LAB — COMPREHENSIVE METABOLIC PANEL
ALT: 99 U/L — ABNORMAL HIGH (ref 0–35)
AST: 102 U/L — ABNORMAL HIGH (ref 0–37)
Albumin: 3.4 g/dL — ABNORMAL LOW (ref 3.5–5.2)
Alkaline Phosphatase: 101 U/L (ref 39–117)
BUN: 16 mg/dL (ref 6–23)
CO2: 24 mEq/L (ref 19–32)
Calcium: 9.6 mg/dL (ref 8.4–10.5)
Chloride: 103 mEq/L (ref 96–112)
Creatinine, Ser: 0.82 mg/dL (ref 0.50–1.10)
GFR calc Af Amer: 89 mL/min — ABNORMAL LOW (ref >90)
GFR calc non Af Amer: 77 mL/min — ABNORMAL LOW (ref >90)
Glucose, Bld: 118 mg/dL — ABNORMAL HIGH (ref 70–99)
Potassium: 3.9 mEq/L (ref 3.5–5.1)
Sodium: 136 mEq/L (ref 135–145)
Total Bilirubin: 0.6 mg/dL (ref 0.3–1.2)
Total Protein: 7.9 g/dL (ref 6.0–8.3)

## 2011-10-19 LAB — CBC
HCT: 38.2 % (ref 36.0–46.0)
Hemoglobin: 12.4 g/dL (ref 12.0–15.0)
MCH: 23.9 pg — ABNORMAL LOW (ref 26.0–34.0)
MCHC: 32.5 g/dL (ref 30.0–36.0)
MCV: 73.6 fL — ABNORMAL LOW (ref 78.0–100.0)
Platelets: 149 10*3/uL — ABNORMAL LOW (ref 150–400)
RBC: 5.19 MIL/uL — ABNORMAL HIGH (ref 3.87–5.11)
RDW: 15.4 % (ref 11.5–15.5)
WBC: 7.4 10*3/uL (ref 4.0–10.5)

## 2011-10-19 NOTE — Pre-Procedure Instructions (Signed)
20 Deborah Wise  10/19/2011   Your procedure is scheduled on:  Monday Oct 25, 2011.  Report to [facility] at 0830 AM.  Call this number if you have problems the morning of surgery: [phone]   Remember:   Do not eat food:After Midnight.  May have clear liquids: up to 4 Hours before arrival until 0430 am.  Clear liquids include soda, tea, black coffee, apple or grape juice, broth.  Take these medicines the morning of surgery with A SIP OF WATER:    Do not wear jewelry, make-up or nail polish.  Do not wear lotions, powders, or perfumes. You may wear deodorant.  Do not shave 48 hours prior to surgery. Men may shave face and neck.  Do not bring valuables to the hospital.  Contacts, dentures or bridgework may not be worn into surgery.  Leave suitcase in the car. After surgery it may be brought to your room.  For patients admitted to the hospital, checkout time is 11:00 AM the day of discharge.   Patients discharged the day of surgery will not be allowed to drive home.  Name and phone number of your driver:   Special Instructions: CHG Shower Use Special Wash: 1/2 bottle night before surgery and 1/2 bottle morning of surgery.   Please read over the following fact sheets that you were given: Pain Booklet, Coughing and Deep Breathing, MRSA Information and Surgical Site Infection Prevention

## 2011-10-20 NOTE — Consult Note (Signed)
Anesthesia Chart Review:  Patient is a 61 year old female scheduled for multiple teeth extraction and alveoloplasty on 10/25/11.  History includes HTN, non-smoker, arthritis, Hepatitis C.  She was medically cleared for this procedure by Marva Panda, NP with [facility].  EKG on 10/19/11 showed SR with sinus arhythmia.  CXR from 10/19/11 showed Stable cardiomegaly. No active lung disease.  Labs noted.  AST 102, ALT 99.  PLT 149. I called her PCP office and was told that her labs in March 2013 showed a AST 125 and ALT 123, and in July 2012 her AST was 154 and ALT 116.  PT/PTT were not done at her PAT appointment. Since AST/ALT are > 2X normal will order coags for the morning of surgery.    Patient has known Hepatitis C.  LFTs have been stable for the past year.  If coags are reasonable, then plan to proceed.  Anesthesiologist Dr. Krista Blue agrees with this plan.   Shonna Chock, PA-C

## 2011-10-21 NOTE — H&P (Signed)
HISTORY AND PHYSICAL  Deborah Wise is a 61 y.o. female patient with CC: bad teeth. Desires full mouth extractions.  No diagnosis found.  Past Medical History  Diagnosis Date  . Hypertension   . Hepatitis     hep C  . Arthritis     No current facility-administered medications for this encounter.   Current Outpatient Prescriptions  Medication Sig Dispense Refill  . hydrochlorothiazide (HYDRODIURIL) 25 MG tablet Take 25 mg by mouth daily.      [name] Kitchen ibuprofen (ADVIL,MOTRIN) 200 MG tablet Take 200 mg by mouth every 6 (six) hours as needed. For pain      . nabumetone (RELAFEN) 750 MG tablet Take 750 mg by mouth 2 (two) times daily as needed. For pain       No Known Allergies Active Problems:  * No active hospital problems. *   Vitals: There were no vitals taken for this visit. Lab results:No results found for this or any previous visit (from the past 24 hour(s)). Radiology Results: No results found. General appearance: alert, cooperative and no distress Head: Normocephalic, without obvious abnormality, atraumatic Eyes: negative Ears: normal TM's and external ear canals both ears Nose: Nares normal. Septum midline. Mucosa normal. No drainage or sinus tenderness. Throat: dental caries, periodontal disease teeth #'s 1, 2, 6, 7, 8, 9, 10, 11, 12, 15, 18, 19, 20, 23, 24, 25, 27, 32. Neck: no adenopathy, no JVD and supple, symmetrical, trachea midline Resp: clear to auscultation bilaterally Cardio: regular rate and rhythm, S1, S2 normal, no murmur, click, rub or gallop  Assessment:60 YO Female HTN, Hep C, Arthritis with non-restorable teeth #'s   1, 2, 6, 7, 8, 9, 10, 11, 12, 15, 18, 19, 20, 23, 24, 25, 27, 32. Plan: Extraction teeth #'s  1, 2, 6, 7, 8, 9, 10, 11, 12, 15, 18, 19, 20, 23, 24, 25, 27, 32. Alveoloplasty. General anesthesia. Day surgery   Waldon Sheerin M 10/21/2011

## 2011-10-25 ENCOUNTER — Ambulatory Visit (HOSPITAL_COMMUNITY)
Admission: RE | Admit: 2011-10-25 | Discharge: 2011-10-25 | Disposition: A | Discharge status: Home / Self Care | Payer: Medicaid Other | Source: Ambulatory Visit | Attending: Oral Surgery | Admitting: Oral Surgery

## 2011-10-25 ENCOUNTER — Ambulatory Visit (HOSPITAL_COMMUNITY): Payer: Medicaid Other | Admitting: Vascular Surgery

## 2011-10-25 ENCOUNTER — Encounter (HOSPITAL_COMMUNITY): Payer: Self-pay | Admitting: *Deleted

## 2011-10-25 ENCOUNTER — Encounter (HOSPITAL_COMMUNITY)
Admission: RE | Disposition: A | Discharge status: Home / Self Care | Payer: Self-pay | Source: Ambulatory Visit | Attending: Oral Surgery

## 2011-10-25 ENCOUNTER — Encounter (HOSPITAL_COMMUNITY): Payer: Self-pay | Admitting: Vascular Surgery

## 2011-10-25 DIAGNOSIS — K029 Dental caries, unspecified: Secondary | ICD-10-CM

## 2011-10-25 DIAGNOSIS — K05329 Chronic periodontitis, generalized, unspecified severity: Secondary | ICD-10-CM

## 2011-10-25 DIAGNOSIS — Z01818 Encounter for other preprocedural examination: Secondary | ICD-10-CM | POA: Insufficient documentation

## 2011-10-25 DIAGNOSIS — B1920 Unspecified viral hepatitis C without hepatic coma: Secondary | ICD-10-CM | POA: Insufficient documentation

## 2011-10-25 DIAGNOSIS — Z01812 Encounter for preprocedural laboratory examination: Secondary | ICD-10-CM | POA: Insufficient documentation

## 2011-10-25 DIAGNOSIS — Z01810 Encounter for preprocedural cardiovascular examination: Secondary | ICD-10-CM | POA: Insufficient documentation

## 2011-10-25 DIAGNOSIS — M129 Arthropathy, unspecified: Secondary | ICD-10-CM | POA: Insufficient documentation

## 2011-10-25 DIAGNOSIS — I10 Essential (primary) hypertension: Secondary | ICD-10-CM | POA: Insufficient documentation

## 2011-10-25 HISTORY — PX: MULTIPLE EXTRACTIONS WITH ALVEOLOPLASTY: SHX5342

## 2011-10-25 LAB — APTT: aPTT: 32 seconds (ref 24–37)

## 2011-10-25 LAB — PROTIME-INR
INR: 0.97 (ref 0.00–1.49)
Prothrombin Time: 13.1 seconds (ref 11.6–15.2)

## 2011-10-25 SURGERY — MULTIPLE EXTRACTION WITH ALVEOLOPLASTY
Anesthesia: General | Laterality: Bilateral | Wound class: Clean Contaminated

## 2011-10-25 MED ORDER — LORAZEPAM 2 MG/ML IJ SOLN: 1.0000 mg | Freq: Once | INTRAMUSCULAR | Status: DC | PRN

## 2011-10-25 MED ORDER — CEFAZOLIN SODIUM 1-5 GM-% IV SOLN
INTRAVENOUS | Status: AC
  Filled 2011-10-25: qty 50

## 2011-10-25 MED ORDER — MIDAZOLAM HCL 5 MG/5ML IJ SOLN
INTRAMUSCULAR | Status: DC | PRN
  Administered 2011-10-25: 2 mg via INTRAVENOUS

## 2011-10-25 MED ORDER — OXYCODONE-ACETAMINOPHEN 5-325 MG PO TABS: 1.0000 | ORAL_TABLET | ORAL | Status: AC | PRN

## 2011-10-25 MED ORDER — FENTANYL CITRATE 0.05 MG/ML IJ SOLN: 50.0000 ug | INTRAMUSCULAR | Status: DC | PRN

## 2011-10-25 MED ORDER — FENTANYL CITRATE 0.05 MG/ML IJ SOLN
INTRAMUSCULAR | Status: DC | PRN
  Administered 2011-10-25: 50 ug via INTRAVENOUS
  Administered 2011-10-25 (×2): 100 ug via INTRAVENOUS

## 2011-10-25 MED ORDER — ONDANSETRON HCL 4 MG/2ML IJ SOLN
INTRAMUSCULAR | Status: DC | PRN
  Administered 2011-10-25: 4 mg via INTRAVENOUS

## 2011-10-25 MED ORDER — LACTATED RINGERS IV SOLN
INTRAVENOUS | Status: DC
  Administered 2011-10-25: 10:00:00 via INTRAVENOUS

## 2011-10-25 MED ORDER — GLYCOPYRROLATE 0.2 MG/ML IJ SOLN
INTRAMUSCULAR | Status: DC | PRN
  Administered 2011-10-25: .8 mg via INTRAVENOUS

## 2011-10-25 MED ORDER — PROPOFOL 10 MG/ML IV EMUL
INTRAVENOUS | Status: DC | PRN
  Administered 2011-10-25: 200 mg via INTRAVENOUS

## 2011-10-25 MED ORDER — LIDOCAINE-EPINEPHRINE 2 %-1:100000 IJ SOLN
INTRAMUSCULAR | Status: DC | PRN
  Administered 2011-10-25: 18 mL

## 2011-10-25 MED ORDER — MIDAZOLAM HCL 2 MG/2ML IJ SOLN: 1.0000 mg | INTRAMUSCULAR | Status: DC | PRN

## 2011-10-25 MED ORDER — LACTATED RINGERS IV SOLN
INTRAVENOUS | Status: DC | PRN
  Administered 2011-10-25 (×2): via INTRAVENOUS

## 2011-10-25 MED ORDER — ROCURONIUM BROMIDE 100 MG/10ML IV SOLN
INTRAVENOUS | Status: DC | PRN
  Administered 2011-10-25: 40 mg via INTRAVENOUS

## 2011-10-25 MED ORDER — SODIUM CHLORIDE 0.9 % IR SOLN
Status: DC | PRN
  Administered 2011-10-25: 1000 mL

## 2011-10-25 MED ORDER — NEOSTIGMINE METHYLSULFATE 1 MG/ML IJ SOLN
INTRAMUSCULAR | Status: DC | PRN
  Administered 2011-10-25: 5 mg via INTRAVENOUS

## 2011-10-25 MED ORDER — CEFAZOLIN SODIUM 1-5 GM-% IV SOLN
INTRAVENOUS | Status: DC | PRN
  Administered 2011-10-25: 1 g via INTRAVENOUS

## 2011-10-25 MED ORDER — HYDROMORPHONE HCL PF 1 MG/ML IJ SOLN: 0.2500 mg | INTRAMUSCULAR | Status: DC | PRN

## 2011-10-25 MED ORDER — PHENYLEPHRINE HCL 10 MG/ML IJ SOLN
INTRAMUSCULAR | Status: DC | PRN
  Administered 2011-10-25: 80 ug via INTRAVENOUS
  Administered 2011-10-25: 40 ug via INTRAVENOUS

## 2011-10-25 SURGICAL SUPPLY — 28 items
BUR CROSS CUT FISSURE 1.6 (BURR) ×2 IMPLANT
BUR EGG ELITE 4.0 (BURR) ×2 IMPLANT
CANISTER SUCTION 2500CC (MISCELLANEOUS) ×2 IMPLANT
CLOTH BEACON ORANGE TIMEOUT ST (SAFETY) ×2 IMPLANT
COVER SURGICAL LIGHT HANDLE (MISCELLANEOUS) ×2 IMPLANT
CRADLE DONUT ADULT HEAD (MISCELLANEOUS) ×2 IMPLANT
DECANTER SPIKE VIAL GLASS SM (MISCELLANEOUS) ×2 IMPLANT
GAUZE PACKING FOLDED 2  STR (GAUZE/BANDAGES/DRESSINGS) ×1
GAUZE PACKING FOLDED 2 STR (GAUZE/BANDAGES/DRESSINGS) ×1 IMPLANT
GLOVE BIO SURGEON STRL SZ 6.5 (GLOVE) ×2 IMPLANT
GLOVE BIO SURGEON STRL SZ7 (GLOVE) ×1 IMPLANT
GLOVE BIO SURGEON STRL SZ7.5 (GLOVE) ×2 IMPLANT
GLOVE BIOGEL PI IND STRL 7.0 (GLOVE) ×1 IMPLANT
GLOVE BIOGEL PI INDICATOR 7.0 (GLOVE) ×2
GOWN STRL NON-REIN LRG LVL3 (GOWN DISPOSABLE) ×4 IMPLANT
GOWN STRL REIN XL XLG (GOWN DISPOSABLE) ×2 IMPLANT
KIT BASIN OR (CUSTOM PROCEDURE TRAY) ×2 IMPLANT
KIT ROOM TURNOVER OR (KITS) ×2 IMPLANT
NEEDLE 22X1 1/2 (OR ONLY) (NEEDLE) ×2 IMPLANT
NS IRRIG 1000ML POUR BTL (IV SOLUTION) ×2 IMPLANT
PAD ARMBOARD 7.5X6 YLW CONV (MISCELLANEOUS) ×4 IMPLANT
SUT CHROMIC 3 0 PS 2 (SUTURE) ×3 IMPLANT
SYR 50ML SLIP (SYRINGE) ×1 IMPLANT
TOWEL OR 17X26 10 PK STRL BLUE (TOWEL DISPOSABLE) ×2 IMPLANT
TRAY ENT MC OR (CUSTOM PROCEDURE TRAY) ×2 IMPLANT
TUBING IRRIGATION (MISCELLANEOUS) ×1 IMPLANT
WATER STERILE IRR 1000ML POUR (IV SOLUTION) IMPLANT
YANKAUER SUCT BULB TIP NO VENT (SUCTIONS) ×2 IMPLANT

## 2011-10-25 NOTE — H&P (Signed)
H&P documentation  -History and Physical Reviewed  -Patient has been re-examined  -No change in the plan of care  Deborah Wise  

## 2011-10-25 NOTE — Anesthesia Preprocedure Evaluation (Addendum)
Anesthesia Evaluation  Patient identified by MRN, date of birth, ID band Patient awake    Reviewed: Allergy & Precautions, H&P , NPO status , Patient's Chart, lab work & pertinent test results  Airway Mallampati: I      Dental  (+) Poor Dentition   Pulmonary          Cardiovascular hypertension,     Neuro/Psych    GI/Hepatic (+) Hepatitis -, C  Endo/Other    Renal/GU      Musculoskeletal   Abdominal   Peds  Hematology   Anesthesia Other Findings   Reproductive/Obstetrics                         Anesthesia Physical Anesthesia Plan  ASA: III  Anesthesia Plan: General   Post-op Pain Management:    Induction: Intravenous  Airway Management Planned: Nasal ETT and Video Laryngoscope Planned  Additional Equipment:   Intra-op Plan:   Post-operative Plan: Extubation in OR  Informed Consent: I have reviewed the patients History and Physical, chart, labs and discussed the procedure including the risks, benefits and alternatives for the proposed anesthesia with the patient or authorized representative who has indicated his/her understanding and acceptance.     Plan Discussed with: CRNA and Surgeon  Anesthesia Plan Comments:         Anesthesia Quick Evaluation

## 2011-10-25 NOTE — Anesthesia Postprocedure Evaluation (Signed)
  Anesthesia Post-op Note  Patient: Deborah Wise  Procedure(s) Performed: Procedure(s) (LRB): MULTIPLE EXTRACION WITH ALVEOLOPLASTY (Bilateral)  Patient Location: PACU  Anesthesia Type: General  Level of Consciousness: awake, alert  and oriented  Airway and Oxygen Therapy: Patient Spontanous Breathing  Post-op Pain: mild  Post-op Assessment: Post-op Vital signs reviewed  Post-op Vital Signs: Reviewed  Complications: No apparent anesthesia complications

## 2011-10-25 NOTE — Transfer of Care (Signed)
Immediate Anesthesia Transfer of Care Note  Patient: Deborah Wise  Procedure(s) Performed: Procedure(s) (LRB): MULTIPLE EXTRACION WITH ALVEOLOPLASTY (Bilateral)  Patient Location: PACU  Anesthesia Type: General  Level of Consciousness: awake, alert  and patient cooperative  Airway & Oxygen Therapy: Patient Spontanous Breathing and Patient connected to face mask oxygen  Post-op Assessment: Report given to PACU RN  Post vital signs: Reviewed and stable  Complications: No apparent anesthesia complications

## 2011-10-25 NOTE — Anesthesia Procedure Notes (Signed)
Procedure Name: Intubation Date/Time: 10/25/2011 11:08 AM Performed by: Glendora Score A Pre-anesthesia Checklist: Patient identified, Emergency Drugs available, Suction available and Patient being monitored Patient Re-evaluated:Patient Re-evaluated prior to inductionOxygen Delivery Method: Circle system utilized Preoxygenation: Pre-oxygenation with 100% oxygen Intubation Type: IV induction Ventilation: Mask ventilation without difficulty Nasal Tubes: Right and Nasal Rae Tube size: 7.0 mm Number of attempts: 1 Airway Equipment and Method: Video-laryngoscopy Placement Confirmation: ETT inserted through vocal cords under direct vision,  positive ETCO2 and breath sounds checked- equal and bilateral Tube secured with: Tape Dental Injury: Teeth and Oropharynx as per pre-operative assessment

## 2011-10-25 NOTE — Preoperative (Signed)
Beta Blockers   Reason not to administer Beta Blockers:Not Applicable 

## 2011-10-25 NOTE — Op Note (Signed)
Deborah Wise, Deborah Wise            ACCOUNT NO.:  [id number]  MEDICAL RECORD NO.:  [id number]  LOCATION:  [facility]                         FACILITY:  [facility]  PHYSICIAN:  Georgia Lopes, M.D.  DATE OF BIRTH:  1951/01/26  DATE OF PROCEDURE:  10/25/2011 DATE OF DISCHARGE:                              OPERATIVE REPORT   PREOPERATIVE DIAGNOSIS:  Nonrestorable teeth numbers 1, 2, 6, 7, 8, 9, 10, 11, 12, 13, 15, 18, 19, 20, 23, 24, 25, 27, 32.  POSTOPERATIVE DIAGNOSIS:  Nonrestorable teeth numbers 1, 2, 6, 7, 8, 9, 10, 11, 12, 13, 15, 18, 19, 20, 23, 24, 25, 27, 32.  PROCEDURE:  Multiple extractions teeth numbers 1, 2, 6, 7, 8, 9, 10, 11, 12, 13, 15, 18, 19, 20, 23, 24, 25, 27, 32; alveoplasty of right and left maxilla.  SURGEON:  Georgia Lopes, M.D.  ANESTHESIA:  General, nasal intubation, Dr. Gypsy Balsam is attending.  INDICATION FOR PROCEDURE:  Deborah Wise is a 61 year old female who is referred to me by her general dentist for removal of multiple nonrestorable and decayed teeth, so that upper denture and lower partial denture could be fabricated because of the extensiveness of the work to be done, it was recommended that the patient have her procedure done with general anesthesia for airway protection.  PROCEDURE:  The patient was taken to the operating room and placed on the table in supine position.  General anesthesia was administered intravenously and a nasal endotracheal tube was placed and secured.  The eyes were protected.  The patient was draped for the procedure.  A time- out was performed.  Posterior pharynx was suctioned.  A throat pack was placed.  A 2% lidocaine, 1:100000 epinephrine was infiltrated in the inferior alveolar block in the right and left mandible, buccal and palatal infiltration to the maxilla, a total of 18 mL was utilized.  A bite block was placed in the right side of the mouth and a sweetheart retractor was placed as well to retract the tongue.  Then, a 15  blade was used to make a full-thickness incision around teeth numbers 18, 19, 20, 23, 24, 25 on the buccal and lingual surfaces. In the maxilla, incision was made beginning at tooth number 15 and carrying across the arch to tooth number 6 on the buccal and palatal aspects.  Then, the periosteum was reflected in the maxilla and mandible with a periosteal elevator.  Interproximal bone was removed around teeth numbers 18, 19, 20, 15, 13, 12, and 11.  The teeth were elevated with a 301 elevator. The lower teeth were removed using the lower universal and Asch forceps, and the upper teeth were removed using the universal #150 forceps. Tooth number 15 fractured and required additional sectioning for removal.  Then, the periosteum was further reflected in the maxilla and alveoplasty was performed with the egg-shaped bur and bone file.  Then, the maxilla and mandible on this side were irrigated and closed with 3-0 chromic.  The bite block was repositioned to the other side of the mouth and a 15 blade was used to make a full-thickness incision around teeth numbers 23, 24, 25, 27, 32 in the mandible and around teeth numbers  1 and 2.  The periosteum was reflected with a periosteal elevator. Interproximal bone was removed around tooth number 32, and the tooth was sectioned and removed in multiple pieces.  Tooth number 1 was also sectioned and removed in multiple pieces. Number 2 and 27 were removed with the universal forceps and then 23, 24, 25 were removed with the Asch forceps.  The sockets were then curetted in the mandible and maxilla.  The periosteum was further reflected in the right maxilla and alveoplasty was performed using the egg-shaped bur and bone file.  Then, the oral cavity was inspected and found to have good contour, closure, and hemostasis.  The oral cavity was irrigated and suctioned.  Throat pack was removed.  The patient was awakened and taken to the recovery room breathing  spontaneously in good condition.  ESTIMATED BLOOD LOSS:  Minimum.  COMPLICATION:  None.  SPECIMENS:  None.     Georgia Lopes, M.D.     SMJ/MEDQ  D:  10/25/2011  T:  10/25/2011  Job:  [id number]

## 2011-10-25 NOTE — Op Note (Signed)
10/25/2011  11:44 AM  PATIENT:  Deborah Wise  61 y.o. female  PRE-OPERATIVE DIAGNOSIS:  non restorable teeth #'s 1, 2, 6, 7, 8, 9, 10, 11, 12, 13, 15, , 18, 19, 20, 23, 24, 25, 27, 32  POST-OPERATIVE DIAGNOSIS:  SAME  PROCEDURE:  Procedure(s): MULTIPLE EXTRACTION teeth #'s 1, 2, 6, 7, 8, 9, 10, 11, 12, 13, 15, , 18, 19, 20, 23, 24, 25, 27, 32 WITH ALVEOLOPLASTY  SURGEON:  Surgeon(s): Georgia Lopes, DDS  ANESTHESIA:   local and general  EBL:  minimal  DRAINS: none   SPECIMEN:  No Specimen  COUNTS:  YES  PLAN OF CARE: Discharge to home after PACU  PATIENT DISPOSITION:  PACU - hemodynamically stable.   PROCEDURE DETAILS: Dictation # [id number]  Georgia Lopes, DMD 10/25/2011 11:44 AM

## 2011-10-26 ENCOUNTER — Encounter (HOSPITAL_COMMUNITY): Payer: Self-pay | Admitting: Oral Surgery

## 2011-12-12 ENCOUNTER — Emergency Department (HOSPITAL_COMMUNITY): Payer: Medicaid Other

## 2011-12-12 ENCOUNTER — Emergency Department (HOSPITAL_COMMUNITY)
Admission: EM | Admit: 2011-12-12 | Discharge: 2011-12-12 | Disposition: A | Discharge status: Home / Self Care | Payer: Medicaid Other | Attending: Emergency Medicine | Admitting: Emergency Medicine

## 2011-12-12 ENCOUNTER — Encounter (HOSPITAL_COMMUNITY): Payer: Self-pay | Admitting: Emergency Medicine

## 2011-12-12 DIAGNOSIS — I10 Essential (primary) hypertension: Secondary | ICD-10-CM | POA: Insufficient documentation

## 2011-12-12 DIAGNOSIS — G8918 Other acute postprocedural pain: Secondary | ICD-10-CM | POA: Insufficient documentation

## 2011-12-12 DIAGNOSIS — Z8739 Personal history of other diseases of the musculoskeletal system and connective tissue: Secondary | ICD-10-CM | POA: Insufficient documentation

## 2011-12-12 LAB — CBC WITH DIFFERENTIAL/PLATELET
Basophils Absolute: 0 10*3/uL (ref 0.0–0.1)
Basophils Relative: 0 % (ref 0–1)
Eosinophils Absolute: 0.1 10*3/uL (ref 0.0–0.7)
Eosinophils Relative: 1 % (ref 0–5)
HCT: 37.5 % (ref 36.0–46.0)
Hemoglobin: 12.3 g/dL (ref 12.0–15.0)
Lymphocytes Relative: 47 % — ABNORMAL HIGH (ref 12–46)
Lymphs Abs: 3.2 10*3/uL (ref 0.7–4.0)
MCH: 23.9 pg — ABNORMAL LOW (ref 26.0–34.0)
MCHC: 32.8 g/dL (ref 30.0–36.0)
MCV: 72.8 fL — ABNORMAL LOW (ref 78.0–100.0)
Monocytes Absolute: 0.8 10*3/uL (ref 0.1–1.0)
Monocytes Relative: 12 % (ref 3–12)
Neutro Abs: 2.8 10*3/uL (ref 1.7–7.7)
Neutrophils Relative %: 40 % — ABNORMAL LOW (ref 43–77)
Platelets: 154 10*3/uL (ref 150–400)
RBC: 5.15 MIL/uL — ABNORMAL HIGH (ref 3.87–5.11)
RDW: 15 % (ref 11.5–15.5)
WBC: 7 10*3/uL (ref 4.0–10.5)

## 2011-12-12 LAB — BASIC METABOLIC PANEL
BUN: 10 mg/dL (ref 6–23)
CO2: 26 mEq/L (ref 19–32)
Calcium: 9.3 mg/dL (ref 8.4–10.5)
Chloride: 104 mEq/L (ref 96–112)
Creatinine, Ser: 0.66 mg/dL (ref 0.50–1.10)
GFR calc Af Amer: >90 mL/min (ref >90)
GFR calc non Af Amer: >90 mL/min (ref >90)
Glucose, Bld: 138 mg/dL — ABNORMAL HIGH (ref 70–99)
Potassium: 4.3 mEq/L (ref 3.5–5.1)
Sodium: 138 mEq/L (ref 135–145)

## 2011-12-12 LAB — SEDIMENTATION RATE: Sed Rate: 12 mm/hr (ref 0–22)

## 2011-12-12 MED ORDER — SODIUM CHLORIDE 0.9 % IV SOLN
Freq: Once | INTRAVENOUS | Status: AC
  Administered 2011-12-12: 19:00:00 via INTRAVENOUS

## 2011-12-12 MED ORDER — ONDANSETRON HCL 4 MG/2ML IJ SOLN
4.0000 mg | Freq: Once | INTRAMUSCULAR | Status: AC
  Administered 2011-12-12: 4 mg via INTRAVENOUS
  Filled 2011-12-12: qty 2

## 2011-12-12 MED ORDER — HYDROMORPHONE HCL PF 1 MG/ML IJ SOLN
1.0000 mg | Freq: Once | INTRAMUSCULAR | Status: AC
  Administered 2011-12-12: 1 mg via INTRAVENOUS
  Filled 2011-12-12: qty 1

## 2011-12-12 MED ORDER — HYDROMORPHONE HCL 2 MG PO TABS: 2.0000 mg | ORAL_TABLET | ORAL | Status: AC | PRN

## 2011-12-12 NOTE — ED Provider Notes (Signed)
History   This chart was scribed for Dione Booze, MD scribed by Magnus Sinning. The patient was seen in room TR11C/TR11C seen at 18:53   CSN: [id number]  Arrival date & time 12/12/11  1720   None     Chief Complaint  Patient presents with  . Ankle Pain    (Consider location/radiation/quality/duration/timing/severity/associated sxs/prior treatment) HPI Deborah Wise is a 61 y.o. female who presents to the Emergency Department complaining of gradually worsening  severe right ankle pain with associated subjective fever, mild sweats, right foot pain, onset 9 days. Patient also notes purulent and blood drainage at operated site, onset 5 days. Patient says she has taken percocet and used ice application with no relief. Explains she had her right ankle operated on 9 days ago by Dr. Lajoyce Corners for treatment of arthritis. She says since she has had gradually worsening right ankle and foot pain.  PCP: [facility] Past Medical History  Diagnosis Date  . Hypertension   . Hepatitis     hep C  . Arthritis     Past Surgical History  Procedure Date  . Broken ankle   . Multiple extractions with alveoloplasty 10/25/2011    Procedure: MULTIPLE EXTRACION WITH ALVEOLOPLASTY;  Surgeon: Georgia Lopes, DDS;  Location: [facility] OR;  Service: Oral Surgery;  Laterality: Bilateral;  Extract teeth numbers one, two, six, seven, eight, nine, ten, eleven, twelve, fifteen, sixteen, eighteen, nineteen, twenty-three, twenty-four, twenty-five, twenty-seven, thirty-two and alveoplasty.  . Ankle surgery     History reviewed. No pertinent family history.  History  Substance Use Topics  . Smoking status: Never Smoker   . Smokeless tobacco: Not on file  . Alcohol Use: 0.6 oz/week    1 Cans of beer per week     occasion   Review of Systems  Constitutional: Positive for fever, chills and diaphoresis.  Musculoskeletal:       Positive ankle and foot pain    Allergies  Review of patient's allergies  indicates no known allergies.  Home Medications   Current Outpatient Rx  Name Route Sig Dispense Refill  . HYDROCHLOROTHIAZIDE 25 MG PO TABS Oral Take 25 mg by mouth daily.    . OXYCODONE-ACETAMINOPHEN 5-325 MG PO TABS Oral Take 1 tablet by mouth every 4 (four) hours as needed. For pain      BP 187/84  Pulse 83  Temp 98.2 F (36.8 C) (Oral)  Resp 16  Ht 5\' 5"  (1.651 m)  Wt 192 lb (87.091 kg)  BMI 31.95 kg/m2  SpO2 100%  Physical Exam  Nursing note and vitals reviewed. Constitutional: She is oriented to person, place, and time. She appears well-developed and well-nourished. No distress.       Appears uncomfortable  HENT:  Head: Normocephalic and atraumatic.  Eyes: EOM are normal.  Neck: Neck supple. No tracheal deviation present.  Cardiovascular: Normal rate.   Pulmonary/Chest: Effort normal. No respiratory distress.  Musculoskeletal: Normal range of motion.       Incisions on medial and lateral aspect appear clean without infection. Mild swelling of the right ankle and right mid foot with tenderness diffusely. No erythema and no lymphangitic streak.   Neurological: She is alert and oriented to person, place, and time.  Skin: Skin is warm and dry.  Psychiatric: She has a normal mood and affect. Her behavior is normal.    ED Course  Procedures (including critical care time) DIAGNOSTIC STUDIES: Oxygen Saturation is 100% on room air, normal by my interpretation.  COORDINATION OF CARE:  Results for orders placed during the hospital encounter of 12/12/11  CBC WITH DIFFERENTIAL      Component Value Range   WBC 7.0  4.0 - 10.5 K/uL   RBC 5.15 (*) 3.87 - 5.11 MIL/uL   Hemoglobin 12.3  12.0 - 15.0 g/dL   HCT 40.9  81.1 - 91.4 %   MCV 72.8 (*) 78.0 - 100.0 fL   MCH 23.9 (*) 26.0 - 34.0 pg   MCHC 32.8  30.0 - 36.0 g/dL   RDW 78.2  95.6 - 21.3 %   Platelets 154  150 - 400 K/uL   Neutrophils Relative 40 (*) 43 - 77 %   Neutro Abs 2.8  1.7 - 7.7 K/uL   Lymphocytes  Relative 47 (*) 12 - 46 %   Lymphs Abs 3.2  0.7 - 4.0 K/uL   Monocytes Relative 12  3 - 12 %   Monocytes Absolute 0.8  0.1 - 1.0 K/uL   Eosinophils Relative 1  0 - 5 %   Eosinophils Absolute 0.1  0.0 - 0.7 K/uL   Basophils Relative 0  0 - 1 %   Basophils Absolute 0.0  0.0 - 0.1 K/uL  BASIC METABOLIC PANEL      Component Value Range   Sodium 138  135 - 145 mEq/L   Potassium 4.3  3.5 - 5.1 mEq/L   Chloride 104  96 - 112 mEq/L   CO2 26  19 - 32 mEq/L   Glucose, Bld 138 (*) 70 - 99 mg/dL   BUN 10  6 - 23 mg/dL   Creatinine, Ser 0.86  0.50 - 1.10 mg/dL   Calcium 9.3  8.4 - 57.8 mg/dL   GFR calc non Af Amer >90  >90 mL/min   GFR calc Af Amer >90  >90 mL/min  SEDIMENTATION RATE      Component Value Range   Sed Rate 12  0 - 22 mm/hr   Dg Ankle Complete Right  12/12/2011  *RADIOLOGY REPORT*  Clinical Data: Ankle pain.  Collapse of the ankle.  Recent "clean out" of the ankle.  Excruciating pain.  RIGHT ANKLE - COMPLETE 3+ VIEW  Comparison: None.  Findings: Antegrade left tibial nail is present with broken inferior screw.  There is motion around the proximal screw with lucency.  Lateral malleolar plate screw fixation is present.  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 Prominence of the talar ridge.  Calcaneal spurs incidentally noted. Ankle effusion is present.  Distal tibia and fibula fractures are healed.  IMPRESSION: Postoperative and post-traumatic changes of the ankle without acute osseous abnormality.  Antegrade left tibial nail with fractured distal interlocking screw.  Original Report Authenticated By: Andreas Newport, M.D.    Dg Ankle Complete Right  12/12/2011  *RADIOLOGY REPORT*  Clinical Data: Ankle pain.  Collapse of the ankle.  Recent "clean out" of the ankle.  Excruciating pain.  RIGHT ANKLE - COMPLETE 3+ VIEW  Comparison: None.  Findings: Antegrade left tibial nail is present with broken inferior screw.  There is motion around the proximal screw with lucency.   Lateral malleolar plate screw fixation is present.  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 Prominence of the talar ridge.  Calcaneal spurs incidentally noted. Ankle effusion is present.  Distal tibia and fibula fractures are healed.  IMPRESSION: Postoperative and post-traumatic changes of the ankle without acute osseous abnormality.  Antegrade left tibial nail with fractured distal interlocking screw.  Original Report Authenticated  By: Andreas Newport, M.D.     1. Post-op pain       MDM  Postop ankle pain. The incisions appear to be healing appropriately and I do not see any signs of infection. Laboratory workup will be done to evaluate for possible evidence of infection.  WBC has come back normal and sedimentation rate is normal. I see no evidence of infection. She will be treated for pain and is given a prescription for hydromorphone since this gave her relief in the emergency department. She is to followup with her orthopedic physician. I have reviewed the patient's prior records and the procedure evidently was done at a facility that does not keep the records in the hospital system.  I personally performed the services described in this documentation, which was scribed in my presence. The recorded information has been reviewed and considered.           Dione Booze, MD 12/16/11 [phone]

## 2011-12-12 NOTE — ED Notes (Signed)
Reports had surgery last Friday June 28th on R ankle- Dr Lajoyce Corners to clean out arthritis per pt- taking percocet for pain with no relief; pt does appear in pain at triage; swelling noted to foot, + DP pulse, red to lateral aspect of foot; pt has two small incisions closed with sutures

## 2012-03-29 ENCOUNTER — Encounter (HOSPITAL_COMMUNITY): Payer: Self-pay | Admitting: Emergency Medicine

## 2012-03-29 ENCOUNTER — Emergency Department (HOSPITAL_COMMUNITY)
Admission: EM | Admit: 2012-03-29 | Discharge: 2012-03-29 | Disposition: A | Discharge status: Home / Self Care | Payer: Medicaid Other | Attending: Emergency Medicine | Admitting: Emergency Medicine

## 2012-03-29 DIAGNOSIS — Z79899 Other long term (current) drug therapy: Secondary | ICD-10-CM | POA: Insufficient documentation

## 2012-03-29 DIAGNOSIS — Z8739 Personal history of other diseases of the musculoskeletal system and connective tissue: Secondary | ICD-10-CM | POA: Insufficient documentation

## 2012-03-29 DIAGNOSIS — M5430 Sciatica, unspecified side: Secondary | ICD-10-CM | POA: Insufficient documentation

## 2012-03-29 DIAGNOSIS — I10 Essential (primary) hypertension: Secondary | ICD-10-CM | POA: Insufficient documentation

## 2012-03-29 DIAGNOSIS — E119 Type 2 diabetes mellitus without complications: Secondary | ICD-10-CM | POA: Insufficient documentation

## 2012-03-29 DIAGNOSIS — M5432 Sciatica, left side: Secondary | ICD-10-CM

## 2012-03-29 HISTORY — DX: Type 2 diabetes mellitus without complications: E11.9

## 2012-03-29 MED ORDER — MELOXICAM 15 MG PO TABS: 15.0000 mg | ORAL_TABLET | Freq: Every day | ORAL | Status: DC

## 2012-03-29 MED ORDER — OXYCODONE-ACETAMINOPHEN 5-325 MG PO TABS: 2.0000 | ORAL_TABLET | ORAL | Status: DC | PRN

## 2012-03-29 NOTE — ED Provider Notes (Signed)
History     CSN: [id number]  Arrival date & time 03/29/12  1000   First MD Initiated Contact with Patient 03/29/12 1029      Chief Complaint  Patient presents with  . Leg Pain    (Consider location/radiation/quality/duration/timing/severity/associated sxs/prior treatment) HPI Comments: Deborah Wise 61 y.o. female   The chief complaint is: Patient presents with:   Leg Pain    84-year-old female presents today with chief complaint of left leg and lower back pain.  Onset is new and began last Friday.  Pain has been worsening in its course.  She describes the pain as constant radiating down the left leg and into the foot.  She has never had this symptom before.  She does have a history of lower back arthritis.  Patient has followup appointment with Dr. Lajoyce Corners next Friday but states that her pain was so severe she fell she needs immediate evaluation.   Denies weakness, loss of bowel/bladder function or saddle anesthesia. Denies fevers chills headache photophobia rash or neck stiffness. Denies any recent procedures to her back.  Patient is a 61 y.o. female presenting with leg pain. The history is provided by the patient. No language interpreter was used.  Leg Pain  Pertinent negatives include no numbness.    Past Medical History  Diagnosis Date  . Hypertension   . Hepatitis     hep C  . Arthritis   . Diabetes mellitus without complication     Past Surgical History  Procedure Date  . Broken ankle   . Multiple extractions with alveoloplasty 10/25/2011    Procedure: MULTIPLE EXTRACION WITH ALVEOLOPLASTY;  Surgeon: Georgia Lopes, DDS;  Location: [facility] OR;  Service: Oral Surgery;  Laterality: Bilateral;  Extract teeth numbers one, two, six, seven, eight, nine, ten, eleven, twelve, fifteen, sixteen, eighteen, nineteen, twenty-three, twenty-four, twenty-five, twenty-seven, thirty-two and alveoplasty.  . Ankle surgery     History reviewed. No pertinent family history.  History    Substance Use Topics  . Smoking status: Never Smoker   . Smokeless tobacco: Not on file  . Alcohol Use: 0.6 oz/week    1 Cans of beer per week     occasion    OB History    Grav Para Term Preterm Abortions TAB SAB Ect Mult Living                  Review of Systems  Constitutional: Negative for fever and chills.  HENT: Negative for trouble swallowing.   Respiratory: Negative for shortness of breath.   Cardiovascular: Negative for chest pain and leg swelling.  Gastrointestinal: Negative for nausea, vomiting, abdominal pain, diarrhea and constipation.  Genitourinary: Negative for dysuria and hematuria.  Musculoskeletal: Positive for back pain, joint swelling and gait problem. Negative for myalgias and arthralgias.  Skin: Negative for rash.  Neurological: Negative for numbness.  All other systems reviewed and are negative.    Allergies  Review of patient's allergies indicates no known allergies.  Home Medications   Current Outpatient Rx  Name Route Sig Dispense Refill  . FUROSEMIDE 40 MG PO TABS Oral Take 40 mg by mouth daily.    [name] Kitchen METFORMIN HCL 500 MG PO TABS Oral Take 500 mg by mouth 2 (two) times daily with a meal.    . MELOXICAM 15 MG PO TABS Oral Take 1 tablet (15 mg total) by mouth daily. 10 tablet 0  . OXYCODONE-ACETAMINOPHEN 5-325 MG PO TABS Oral Take 2 tablets by mouth every 4 (four) hours  as needed for pain. 10 tablet 0    BP 187/100  Pulse 82  Temp 98.3 F (36.8 C) (Oral)  Resp 20  SpO2 99%  Physical Exam  Nursing note and vitals reviewed. Constitutional: She is oriented to person, place, and time. She appears well-developed and well-nourished. No distress.  HENT:  Head: Normocephalic and atraumatic.  Eyes: Conjunctivae normal are normal. No scleral icterus.  Neck: Normal range of motion.  Cardiovascular: Normal rate, regular rhythm and normal heart sounds.  Exam reveals no gallop and no friction rub.   No murmur heard. Pulmonary/Chest: Effort normal  and breath sounds normal. No respiratory distress.  Abdominal: Soft. Bowel sounds are normal. She exhibits no distension and no mass. There is no tenderness. There is no guarding.  Musculoskeletal:       And it is to palpation of the SI joints bilaterally and left gluteal muscle.  Positive straight leg raise.  No ecchymosis or deformity present.  Leg is neurovascularly intact.  Back range of motion limited by pain.  Antalgic gait.  Neurological: She is alert and oriented to person, place, and time. She has normal reflexes.  Skin: Skin is warm and dry. She is not diaphoretic.    ED Course  Procedures (including critical care time)  Labs Reviewed - No data to display No results found.   1. Sciatica of left side       MDM  Patient with new onset left-sided sciatic pain presentation.  She has followup appointment with Dr. Lajoyce Corners.  I'll give the patient Percocet and and changing her anti-inflammatory from Relafen to a lot of sick and.  I've given her instruction to stop the Relafen.  She may followup with Dr. Lajoyce Corners next week.  Discussed reasons to seek immediate care. Patient expresses understanding and agrees with plan.         Arthor Captain, PA-C 03/29/12 1600

## 2012-03-29 NOTE — ED Provider Notes (Signed)
Medical screening examination/treatment/procedure(s) were performed by non-physician practitioner and as supervising physician I was immediately available for consultation/collaboration.   Carleene Cooper III, MD 03/29/12 [phone]

## 2012-03-29 NOTE — ED Notes (Signed)
Pt c/o left leg pain x 1 week; pt sts pain from buttocks running down leg; pt denies obvious injury

## 2012-03-29 NOTE — ED Notes (Signed)
Pt presents to department for evaluation of L hip/leg pain. Ongoing x1 week. Pt denies recent injury. 8/10 pain at the time, increases with movement. No relief with medications at home. She is alert and oriented x4. Ambulatory to exam room.

## 2012-06-20 ENCOUNTER — Ambulatory Visit
Admission: RE | Admit: 2012-06-20 | Discharge: 2012-06-20 | Disposition: A | Discharge status: Home / Self Care | Payer: Medicaid Other | Source: Ambulatory Visit | Attending: Emergency Medicine | Admitting: Emergency Medicine

## 2012-06-20 DIAGNOSIS — N644 Mastodynia: Secondary | ICD-10-CM

## 2012-08-30 ENCOUNTER — Other Ambulatory Visit: Payer: Self-pay

## 2012-08-30 DIAGNOSIS — Z1231 Encounter for screening mammogram for malignant neoplasm of breast: Secondary | ICD-10-CM

## 2012-09-17 ENCOUNTER — Encounter (HOSPITAL_COMMUNITY): Payer: Self-pay | Admitting: Emergency Medicine

## 2012-09-17 ENCOUNTER — Emergency Department (HOSPITAL_COMMUNITY)
Admission: EM | Admit: 2012-09-17 | Discharge: 2012-09-17 | Disposition: A | Discharge status: Home / Self Care | Payer: Medicaid Other | Attending: Emergency Medicine | Admitting: Emergency Medicine

## 2012-09-17 DIAGNOSIS — S139XXA Sprain of joints and ligaments of unspecified parts of neck, initial encounter: Secondary | ICD-10-CM | POA: Insufficient documentation

## 2012-09-17 DIAGNOSIS — E119 Type 2 diabetes mellitus without complications: Secondary | ICD-10-CM | POA: Insufficient documentation

## 2012-09-17 DIAGNOSIS — Y939 Activity, unspecified: Secondary | ICD-10-CM | POA: Insufficient documentation

## 2012-09-17 DIAGNOSIS — S161XXA Strain of muscle, fascia and tendon at neck level, initial encounter: Secondary | ICD-10-CM

## 2012-09-17 DIAGNOSIS — X58XXXA Exposure to other specified factors, initial encounter: Secondary | ICD-10-CM | POA: Insufficient documentation

## 2012-09-17 DIAGNOSIS — Z8619 Personal history of other infectious and parasitic diseases: Secondary | ICD-10-CM | POA: Insufficient documentation

## 2012-09-17 DIAGNOSIS — Y929 Unspecified place or not applicable: Secondary | ICD-10-CM | POA: Insufficient documentation

## 2012-09-17 DIAGNOSIS — Z79899 Other long term (current) drug therapy: Secondary | ICD-10-CM | POA: Insufficient documentation

## 2012-09-17 DIAGNOSIS — I10 Essential (primary) hypertension: Secondary | ICD-10-CM | POA: Insufficient documentation

## 2012-09-17 DIAGNOSIS — M1990 Unspecified osteoarthritis, unspecified site: Secondary | ICD-10-CM | POA: Insufficient documentation

## 2012-09-17 MED ORDER — METHOCARBAMOL 500 MG PO TABS: 500.0000 mg | ORAL_TABLET | Freq: Two times a day (BID) | ORAL | Status: DC

## 2012-09-17 MED ORDER — NAPROXEN 500 MG PO TABS: 500.0000 mg | ORAL_TABLET | Freq: Two times a day (BID) | ORAL | Status: DC

## 2012-09-17 NOTE — ED Provider Notes (Signed)
History     CSN: [id number]  Arrival date & time 09/17/12  1014   First MD Initiated Contact with Patient 09/17/12 1057      Chief Complaint  Patient presents with  . Neck Pain    (Consider location/radiation/quality/duration/timing/severity/associated sxs/prior treatment) HPI Comments: Patient presents today with a chief complaint with a chief complaint of left sided neck pain for the past one week.  Pain worse with movement.  No acute injury or trauma.  She has not taken anything for the pain.  Patient is a 62 y.o. female presenting with neck pain. The history is provided by the patient.  Neck Pain Pain location:  L side Quality:  Aching and stiffness Pain radiates to:  Does not radiate Onset quality:  Gradual Timing:  Constant Progression:  Unchanged Context: not lifting a heavy object and not recent injury   Relieved by:  None tried Associated symptoms: no fever, no headaches, no numbness, no paresis, no syncope, no tingling, no visual change and no weakness   Risk factors: no recent head injury     Past Medical History  Diagnosis Date  . Hypertension   . Hepatitis     hep C  . Arthritis   . Diabetes mellitus without complication     Past Surgical History  Procedure Laterality Date  . Broken ankle    . Multiple extractions with alveoloplasty  10/25/2011    Procedure: MULTIPLE EXTRACION WITH ALVEOLOPLASTY;  Surgeon: Georgia Lopes, DDS;  Location: [facility] OR;  Service: Oral Surgery;  Laterality: Bilateral;  Extract teeth numbers one, two, six, seven, eight, nine, ten, eleven, twelve, fifteen, sixteen, eighteen, nineteen, twenty-three, twenty-four, twenty-five, twenty-seven, thirty-two and alveoplasty.  . Ankle surgery      History reviewed. No pertinent family history.  History  Substance Use Topics  . Smoking status: Never Smoker   . Smokeless tobacco: Not on file  . Alcohol Use: 0.6 oz/week    1 Cans of beer per week     Comment: occasion    OB History   Grav Para Term Preterm Abortions TAB SAB Ect Mult Living                  Review of Systems  Constitutional: Negative for fever and chills.  HENT: Positive for neck pain and neck stiffness.   Cardiovascular: Negative for syncope.  Neurological: Negative for tingling, weakness, numbness and headaches.  All other systems reviewed and are negative.    Allergies  Review of patient's allergies indicates no known allergies.  Home Medications   Current Outpatient Rx  Name  Route  Sig  Dispense  Refill  . amLODipine (NORVASC) 10 MG tablet   Oral   Take 10 mg by mouth daily.         . metFORMIN (GLUCOPHAGE) 500 MG tablet   Oral   Take 500 mg by mouth 2 (two) times daily with a meal.           BP 133/72  Pulse 96  Temp(Src) 98.3 F (36.8 C) (Oral)  Resp 18  SpO2 96%  Physical Exam  Nursing note and vitals reviewed. Constitutional: She appears well-developed and well-nourished.  HENT:  Head: Normocephalic and atraumatic.  Neck: Normal range of motion. Neck supple. Muscular tenderness present. No spinous process tenderness present. No edema and no erythema present.  Pain with ROM of the neck  Cardiovascular: Normal rate, regular rhythm and normal heart sounds.   Pulmonary/Chest: Effort normal and breath sounds normal.  Musculoskeletal: Normal range of motion.  Neurological: She is alert. She has normal strength. No sensory deficit. Gait normal.  Reflex Scores:      Brachioradialis reflexes are 2+ on the right side and 1+ on the left side. Grip strength 5/5 bilaterally  Skin: Skin is warm and dry.  Psychiatric: She has a normal mood and affect.    ED Course  Procedures (including critical care time)  Labs Reviewed - No data to display No results found.   No diagnosis found.    MDM  Patient presenting with left sided neck pain x 1 week.  Pain appears to be muscular.  No acute trauma.  Sensation of LUE intact.  Patient given prescription for muscle relaxer.   Return precautions given.        Pascal Lux [place], PA-C 09/17/12 2152

## 2012-09-17 NOTE — ED Notes (Signed)
Pt c/o left sided neck pain x 1 week worse with movement

## 2012-09-20 NOTE — ED Provider Notes (Signed)
Medical screening examination/treatment/procedure(s) were performed by non-physician practitioner and as supervising physician I was immediately available for consultation/collaboration.   Michael Y. Ghim, MD 09/20/12 1209 

## 2012-10-03 ENCOUNTER — Ambulatory Visit: Payer: Medicaid Other

## 2012-10-06 ENCOUNTER — Ambulatory Visit
Admission: RE | Admit: 2012-10-06 | Discharge: 2012-10-06 | Disposition: A | Discharge status: Home / Self Care | Payer: Medicaid Other | Source: Ambulatory Visit

## 2012-10-06 DIAGNOSIS — Z1231 Encounter for screening mammogram for malignant neoplasm of breast: Secondary | ICD-10-CM

## 2013-07-20 ENCOUNTER — Inpatient Hospital Stay (HOSPITAL_COMMUNITY)
Admission: EM | Admit: 2013-07-20 | Discharge: 2013-07-22 | DRG: 639 | Disposition: A | Discharge status: Home / Self Care | Payer: Medicaid Other | Attending: Internal Medicine | Admitting: Internal Medicine

## 2013-07-20 DIAGNOSIS — B1920 Unspecified viral hepatitis C without hepatic coma: Secondary | ICD-10-CM | POA: Diagnosis present

## 2013-07-20 DIAGNOSIS — K769 Liver disease, unspecified: Secondary | ICD-10-CM | POA: Diagnosis present

## 2013-07-20 DIAGNOSIS — E162 Hypoglycemia, unspecified: Secondary | ICD-10-CM | POA: Diagnosis present

## 2013-07-20 DIAGNOSIS — F1010 Alcohol abuse, uncomplicated: Secondary | ICD-10-CM | POA: Diagnosis present

## 2013-07-20 DIAGNOSIS — Z833 Family history of diabetes mellitus: Secondary | ICD-10-CM

## 2013-07-20 DIAGNOSIS — E119 Type 2 diabetes mellitus without complications: Secondary | ICD-10-CM | POA: Diagnosis present

## 2013-07-20 DIAGNOSIS — Z8249 Family history of ischemic heart disease and other diseases of the circulatory system: Secondary | ICD-10-CM

## 2013-07-20 DIAGNOSIS — M129 Arthropathy, unspecified: Secondary | ICD-10-CM | POA: Diagnosis present

## 2013-07-20 DIAGNOSIS — M1990 Unspecified osteoarthritis, unspecified site: Secondary | ICD-10-CM | POA: Diagnosis present

## 2013-07-20 DIAGNOSIS — I10 Essential (primary) hypertension: Secondary | ICD-10-CM | POA: Diagnosis present

## 2013-07-20 DIAGNOSIS — E1169 Type 2 diabetes mellitus with other specified complication: Principal | ICD-10-CM | POA: Diagnosis present

## 2013-07-20 LAB — GLUCOSE, CAPILLARY: Glucose-Capillary: 30 mg/dL — CL (ref 70–99)

## 2013-07-20 MED ORDER — DEXTROSE 50 % IV SOLN
50.0000 mL | Freq: Once | INTRAVENOUS | Status: AC
  Administered 2013-07-20: 50 mL via INTRAVENOUS
  Filled 2013-07-20: qty 50

## 2013-07-20 NOTE — ED Notes (Signed)
Patient is A/O at this time. BS 30 on cbg. Patient given OJ and peanut butter and graham crackers.

## 2013-07-20 NOTE — ED Notes (Signed)
Patient arrived via POV. States she is not feeling well and feels like her sugar is low. CBG is 30, she is alert and oriented. Patient given OJ, Graham crackers and peanut butter. She states EMS came to her house last month for hypoglycemia then. She checked her sugar this AM and it was 36, she ate and rechecked it later today and it was 37. Patient is A/O at this time.

## 2013-07-21 ENCOUNTER — Inpatient Hospital Stay (HOSPITAL_COMMUNITY): Payer: Medicaid Other

## 2013-07-21 ENCOUNTER — Encounter (HOSPITAL_COMMUNITY): Payer: Self-pay | Admitting: Internal Medicine

## 2013-07-21 DIAGNOSIS — K769 Liver disease, unspecified: Secondary | ICD-10-CM

## 2013-07-21 DIAGNOSIS — E162 Hypoglycemia, unspecified: Secondary | ICD-10-CM | POA: Diagnosis present

## 2013-07-21 DIAGNOSIS — I10 Essential (primary) hypertension: Secondary | ICD-10-CM

## 2013-07-21 DIAGNOSIS — F1010 Alcohol abuse, uncomplicated: Secondary | ICD-10-CM | POA: Diagnosis present

## 2013-07-21 DIAGNOSIS — E119 Type 2 diabetes mellitus without complications: Secondary | ICD-10-CM

## 2013-07-21 LAB — GLUCOSE, CAPILLARY
Glucose-Capillary: 117 mg/dL — ABNORMAL HIGH (ref 70–99)
Glucose-Capillary: 118 mg/dL — ABNORMAL HIGH (ref 70–99)
Glucose-Capillary: 124 mg/dL — ABNORMAL HIGH (ref 70–99)
Glucose-Capillary: 127 mg/dL — ABNORMAL HIGH (ref 70–99)
Glucose-Capillary: 162 mg/dL — ABNORMAL HIGH (ref 70–99)
Glucose-Capillary: 197 mg/dL — ABNORMAL HIGH (ref 70–99)
Glucose-Capillary: 230 mg/dL — ABNORMAL HIGH (ref 70–99)
Glucose-Capillary: 51 mg/dL — ABNORMAL LOW (ref 70–99)
Glucose-Capillary: 64 mg/dL — ABNORMAL LOW (ref 70–99)
Glucose-Capillary: 65 mg/dL — ABNORMAL LOW (ref 70–99)
Glucose-Capillary: 76 mg/dL (ref 70–99)
Glucose-Capillary: 78 mg/dL (ref 70–99)

## 2013-07-21 LAB — CBC
HCT: 35.2 % — ABNORMAL LOW (ref 36.0–46.0)
Hemoglobin: 11.7 g/dL — ABNORMAL LOW (ref 12.0–15.0)
MCH: 24.9 pg — ABNORMAL LOW (ref 26.0–34.0)
MCHC: 33.2 g/dL (ref 30.0–36.0)
MCV: 74.9 fL — ABNORMAL LOW (ref 78.0–100.0)
Platelets: 163 10*3/uL (ref 150–400)
RBC: 4.7 MIL/uL (ref 3.87–5.11)
RDW: 15.5 % (ref 11.5–15.5)
WBC: 5.6 10*3/uL (ref 4.0–10.5)

## 2013-07-21 LAB — COMPREHENSIVE METABOLIC PANEL
ALT: 194 U/L — ABNORMAL HIGH (ref 0–35)
ALT: 218 U/L — ABNORMAL HIGH (ref 0–35)
AST: 229 U/L — ABNORMAL HIGH (ref 0–37)
AST: 272 U/L — ABNORMAL HIGH (ref 0–37)
Albumin: 3.3 g/dL — ABNORMAL LOW (ref 3.5–5.2)
Albumin: 3.3 g/dL — ABNORMAL LOW (ref 3.5–5.2)
Alkaline Phosphatase: 101 U/L (ref 39–117)
Alkaline Phosphatase: 98 U/L (ref 39–117)
BUN: 10 mg/dL (ref 6–23)
BUN: 9 mg/dL (ref 6–23)
CO2: 21 mEq/L (ref 19–32)
CO2: 27 mEq/L (ref 19–32)
Calcium: 9.1 mg/dL (ref 8.4–10.5)
Calcium: 9.4 mg/dL (ref 8.4–10.5)
Chloride: 100 mEq/L (ref 96–112)
Chloride: 99 mEq/L (ref 96–112)
Creatinine, Ser: 0.5 mg/dL (ref 0.50–1.10)
Creatinine, Ser: 0.58 mg/dL (ref 0.50–1.10)
GFR calc Af Amer: >90 mL/min (ref >90)
GFR calc Af Amer: >90 mL/min (ref >90)
GFR calc non Af Amer: >90 mL/min (ref >90)
GFR calc non Af Amer: >90 mL/min (ref >90)
Glucose, Bld: 270 mg/dL — ABNORMAL HIGH (ref 70–99)
Glucose, Bld: 58 mg/dL — ABNORMAL LOW (ref 70–99)
Potassium: 3.7 mEq/L (ref 3.7–5.3)
Potassium: 3.9 mEq/L (ref 3.7–5.3)
Sodium: 136 mEq/L — ABNORMAL LOW (ref 137–147)
Sodium: 141 mEq/L (ref 137–147)
Total Bilirubin: 0.6 mg/dL (ref 0.3–1.2)
Total Bilirubin: 1 mg/dL (ref 0.3–1.2)
Total Protein: 7.5 g/dL (ref 6.0–8.3)
Total Protein: 7.5 g/dL (ref 6.0–8.3)

## 2013-07-21 LAB — URINALYSIS, ROUTINE W REFLEX MICROSCOPIC
Bilirubin Urine: NEGATIVE
Glucose, UA: NEGATIVE mg/dL
Hgb urine dipstick: NEGATIVE
Ketones, ur: NEGATIVE mg/dL
Leukocytes, UA: NEGATIVE
Nitrite: NEGATIVE
Protein, ur: NEGATIVE mg/dL
Specific Gravity, Urine: 1.013 (ref 1.005–1.030)
Urobilinogen, UA: 2 mg/dL — ABNORMAL HIGH (ref 0.0–1.0)
pH: 5.5 (ref 5.0–8.0)

## 2013-07-21 LAB — CBC WITH DIFFERENTIAL/PLATELET
Basophils Absolute: 0 10*3/uL (ref 0.0–0.1)
Basophils Relative: 0 % (ref 0–1)
Eosinophils Absolute: 0 10*3/uL (ref 0.0–0.7)
Eosinophils Relative: 0 % (ref 0–5)
HCT: 35.2 % — ABNORMAL LOW (ref 36.0–46.0)
Hemoglobin: 11.5 g/dL — ABNORMAL LOW (ref 12.0–15.0)
Lymphocytes Relative: 35 % (ref 12–46)
Lymphs Abs: 1.7 10*3/uL (ref 0.7–4.0)
MCH: 24.7 pg — ABNORMAL LOW (ref 26.0–34.0)
MCHC: 32.7 g/dL (ref 30.0–36.0)
MCV: 75.5 fL — ABNORMAL LOW (ref 78.0–100.0)
Monocytes Absolute: 0.6 10*3/uL (ref 0.1–1.0)
Monocytes Relative: 12 % (ref 3–12)
Neutro Abs: 2.6 10*3/uL (ref 1.7–7.7)
Neutrophils Relative %: 53 % (ref 43–77)
Platelets: 147 10*3/uL — ABNORMAL LOW (ref 150–400)
RBC: 4.66 MIL/uL (ref 3.87–5.11)
RDW: 15.9 % — ABNORMAL HIGH (ref 11.5–15.5)
WBC: 4.9 10*3/uL (ref 4.0–10.5)

## 2013-07-21 LAB — PROTIME-INR
INR: 1 (ref 0.00–1.49)
Prothrombin Time: 13 seconds (ref 11.6–15.2)

## 2013-07-21 LAB — TSH: TSH: 2.038 u[IU]/mL (ref 0.350–4.500)

## 2013-07-21 LAB — MAGNESIUM: Magnesium: 2 mg/dL (ref 1.5–2.5)

## 2013-07-21 LAB — HEMOGLOBIN A1C
Hgb A1c MFr Bld: 4.9 % (ref <5.7)
Mean Plasma Glucose: 94 mg/dL (ref <117)

## 2013-07-21 LAB — PHOSPHORUS: Phosphorus: 3 mg/dL (ref 2.3–4.6)

## 2013-07-21 MED ORDER — LORAZEPAM 2 MG/ML IJ SOLN: 0.0000 mg | Freq: Two times a day (BID) | INTRAMUSCULAR | Status: DC

## 2013-07-21 MED ORDER — DOCUSATE SODIUM 100 MG PO CAPS
100.0000 mg | ORAL_CAPSULE | Freq: Two times a day (BID) | ORAL | Status: DC
  Administered 2013-07-21 – 2013-07-22 (×3): 100 mg via ORAL
  Filled 2013-07-21 (×3): qty 1

## 2013-07-21 MED ORDER — ACETAMINOPHEN 325 MG PO TABS: 650.0000 mg | ORAL_TABLET | Freq: Four times a day (QID) | ORAL | Status: DC | PRN

## 2013-07-21 MED ORDER — HEPARIN SODIUM (PORCINE) 5000 UNIT/ML IJ SOLN
5000.0000 [IU] | Freq: Three times a day (TID) | INTRAMUSCULAR | Status: DC
  Administered 2013-07-21 (×2): 5000 [IU] via SUBCUTANEOUS
  Filled 2013-07-21 (×6): qty 1

## 2013-07-21 MED ORDER — LORAZEPAM 1 MG PO TABS: 1.0000 mg | ORAL_TABLET | Freq: Four times a day (QID) | ORAL | Status: DC | PRN

## 2013-07-21 MED ORDER — PANTOPRAZOLE SODIUM 40 MG PO TBEC
40.0000 mg | DELAYED_RELEASE_TABLET | Freq: Every day | ORAL | Status: DC
  Administered 2013-07-21: 40 mg via ORAL
  Filled 2013-07-21 (×2): qty 1

## 2013-07-21 MED ORDER — INSULIN ASPART 100 UNIT/ML ~~LOC~~ SOLN
0.0000 [IU] | Freq: Three times a day (TID) | SUBCUTANEOUS | Status: DC
  Administered 2013-07-21: 3 [IU] via SUBCUTANEOUS

## 2013-07-21 MED ORDER — GLUCOSE-VITAMIN C 4-6 GM-MG PO CHEW: 4.0000 | CHEWABLE_TABLET | ORAL | Status: DC | PRN

## 2013-07-21 MED ORDER — FOLIC ACID 1 MG PO TABS
1.0000 mg | ORAL_TABLET | Freq: Every day | ORAL | Status: DC
  Administered 2013-07-21 – 2013-07-22 (×2): 1 mg via ORAL
  Filled 2013-07-21 (×2): qty 1

## 2013-07-21 MED ORDER — SODIUM CHLORIDE 0.9 % IJ SOLN
3.0000 mL | Freq: Two times a day (BID) | INTRAMUSCULAR | Status: DC
  Administered 2013-07-21 (×2): 3 mL via INTRAVENOUS

## 2013-07-21 MED ORDER — THIAMINE HCL 100 MG/ML IJ SOLN
100.0000 mg | Freq: Every day | INTRAMUSCULAR | Status: DC
  Filled 2013-07-21: qty 1

## 2013-07-21 MED ORDER — DEXTROSE 50 % IV SOLN: 50.0000 mL | Freq: Once | INTRAVENOUS | Status: AC | PRN

## 2013-07-21 MED ORDER — THIAMINE HCL 100 MG/ML IJ SOLN
100.0000 mg | Freq: Every day | INTRAMUSCULAR | Status: DC
  Filled 2013-07-21 (×2): qty 1

## 2013-07-21 MED ORDER — CHLORDIAZEPOXIDE HCL 5 MG PO CAPS
10.0000 mg | ORAL_CAPSULE | Freq: Three times a day (TID) | ORAL | Status: DC
  Administered 2013-07-21 – 2013-07-22 (×3): 10 mg via ORAL
  Filled 2013-07-21 (×3): qty 2

## 2013-07-21 MED ORDER — LORAZEPAM 2 MG/ML IJ SOLN
1.0000 mg | Freq: Four times a day (QID) | INTRAMUSCULAR | Status: DC | PRN
  Administered 2013-07-21: 1 mg via INTRAVENOUS

## 2013-07-21 MED ORDER — VITAMIN B-1 100 MG PO TABS
100.0000 mg | ORAL_TABLET | Freq: Every day | ORAL | Status: DC
  Administered 2013-07-21 – 2013-07-22 (×2): 100 mg via ORAL
  Filled 2013-07-21 (×2): qty 1

## 2013-07-21 MED ORDER — DEXTROSE-NACL 5-0.45 % IV SOLN
INTRAVENOUS | Status: AC
  Administered 2013-07-21: 05:00:00 via INTRAVENOUS

## 2013-07-21 MED ORDER — ADULT MULTIVITAMIN W/MINERALS CH
1.0000 | ORAL_TABLET | Freq: Every day | ORAL | Status: DC
  Administered 2013-07-21 – 2013-07-22 (×2): 1 via ORAL
  Filled 2013-07-21 (×2): qty 1

## 2013-07-21 MED ORDER — AMLODIPINE BESYLATE 5 MG PO TABS
5.0000 mg | ORAL_TABLET | Freq: Every day | ORAL | Status: DC
  Administered 2013-07-21 – 2013-07-22 (×2): 5 mg via ORAL
  Filled 2013-07-21 (×2): qty 1

## 2013-07-21 MED ORDER — ACETAMINOPHEN 650 MG RE SUPP: 650.0000 mg | Freq: Four times a day (QID) | RECTAL | Status: DC | PRN

## 2013-07-21 MED ORDER — DEXTROSE 50 % IV SOLN
25.0000 mL | Freq: Once | INTRAVENOUS | Status: AC | PRN
  Administered 2013-07-21: 25 mL via INTRAVENOUS
  Filled 2013-07-21: qty 50

## 2013-07-21 MED ORDER — ONDANSETRON HCL 4 MG/2ML IJ SOLN: 4.0000 mg | Freq: Four times a day (QID) | INTRAMUSCULAR | Status: DC | PRN

## 2013-07-21 MED ORDER — GLIPIZIDE 2.5 MG HALF TABLET
2.5000 mg | ORAL_TABLET | Freq: Every day | ORAL | Status: DC
  Administered 2013-07-22: 2.5 mg via ORAL
  Filled 2013-07-21 (×3): qty 1

## 2013-07-21 MED ORDER — HYDROCODONE-ACETAMINOPHEN 5-325 MG PO TABS: 1.0000 | ORAL_TABLET | ORAL | Status: DC | PRN

## 2013-07-21 MED ORDER — ONDANSETRON HCL 4 MG PO TABS: 4.0000 mg | ORAL_TABLET | Freq: Four times a day (QID) | ORAL | Status: DC | PRN

## 2013-07-21 MED ORDER — GLUCOSE 40 % PO GEL
1.0000 | ORAL | Status: DC | PRN
  Filled 2013-07-21: qty 1

## 2013-07-21 MED ORDER — INSULIN ASPART 100 UNIT/ML ~~LOC~~ SOLN: 0.0000 [IU] | Freq: Every day | SUBCUTANEOUS | Status: DC

## 2013-07-21 MED ORDER — GLUCAGON HCL (RDNA) 1 MG IJ SOLR
1.0000 mg | Freq: Once | INTRAMUSCULAR | Status: AC | PRN
  Administered 2013-07-21: 1 mg via INTRAVENOUS
  Filled 2013-07-21: qty 1

## 2013-07-21 MED ORDER — LORAZEPAM 2 MG/ML IJ SOLN
0.0000 mg | Freq: Four times a day (QID) | INTRAMUSCULAR | Status: DC
  Filled 2013-07-21: qty 1

## 2013-07-21 MED ORDER — INSULIN ASPART 100 UNIT/ML ~~LOC~~ SOLN
0.0000 [IU] | SUBCUTANEOUS | Status: DC
Start: 2013-07-21 — End: 2013-07-21
  Administered 2013-07-21: 1 [IU] via SUBCUTANEOUS

## 2013-07-21 MED ORDER — HYDRALAZINE HCL 20 MG/ML IJ SOLN
10.0000 mg | Freq: Once | INTRAMUSCULAR | Status: DC
  Filled 2013-07-21: qty 0.5

## 2013-07-21 MED ORDER — INSULIN ASPART 100 UNIT/ML ~~LOC~~ SOLN: 0.0000 [IU] | Freq: Three times a day (TID) | SUBCUTANEOUS | Status: DC

## 2013-07-21 NOTE — Progress Notes (Signed)
Pt admitted to unit, 5w09. Pt alert and oriented. Blood sugar on admission to unit, 127. Glucometer states that pt MRN is not valid number. Pt oriented to room, belongings at bedside (shirt & coat). Placed on tele box 18, NSR. Call bell within reach, pt states having fallen in December due to low sugar, will place on bed alarm and instruct pt to call when assistance is needed. Will continue to monitor pt per MD orders.

## 2013-07-21 NOTE — Progress Notes (Signed)
Pt states that she has not taken her amlodipine 5mg  since Thursday morning. Pt BP 181/83, notified Lynch,NP. Given order for one time dose hydralazine 10mg . Med is not available in pyxis , spoke with Veronda in pharmacy who stated that she would tube it to floor. Will continue to monitor pt.

## 2013-07-21 NOTE — Progress Notes (Signed)
Report taken from Shanon Brow, [facility] in ED.

## 2013-07-21 NOTE — ED Provider Notes (Signed)
CSN: [id number]     Arrival date & time 07/20/13  2241 History   First MD Initiated Contact with Patient 07/20/13 2343     Chief Complaint  Patient presents with  . Hypoglycemia     (Consider location/radiation/quality/duration/timing/severity/associated sxs/prior Treatment) HPI Patient has history of diabetes and is on metformin 500 mg twice a day. She's on no other medication for diabetes in her speech has that has not changed recently. She states that she has been feeling generally weak with episodes of diaphoresis and numbness in her hand all day today. She checked her sugar several times and has been in the 30s. She would eat and then her symptoms would improve relief. Her CBC was 30 in triage. She was given orange juice and crackers and states that she is feeling much better at this point. She has no numbness or tingling. She has no headache. She denies any abdominal pain, vomiting or diarrhea. She does admit to urinary frequency and dysuria. Past Medical History  Diagnosis Date  . Hypertension   . Hepatitis     hep C  . Arthritis   . Diabetes mellitus without complication    Past Surgical History  Procedure Laterality Date  . Broken ankle    . Multiple extractions with alveoloplasty  10/25/2011    Procedure: MULTIPLE EXTRACION WITH ALVEOLOPLASTY;  Surgeon: Gae Bon, DDS;  Location: [facility];  Service: Oral Surgery;  Laterality: Bilateral;  Extract teeth numbers one, two, six, seven, eight, nine, ten, eleven, twelve, fifteen, sixteen, eighteen, nineteen, twenty-three, twenty-four, twenty-five, twenty-seven, thirty-two and alveoplasty.  . Ankle surgery     No family history on file. History  Substance Use Topics  . Smoking status: Never Smoker   . Smokeless tobacco: Not on file  . Alcohol Use: 0.6 oz/week    1 Cans of beer per week     Comment: occasion   OB History   Grav Para Term Preterm Abortions TAB SAB Ect Mult Living                 Review of Systems   Constitutional: Positive for diaphoresis and fatigue. Negative for fever and chills.  Respiratory: Negative for cough and shortness of breath.   Cardiovascular: Negative for chest pain.  Gastrointestinal: Negative for nausea, vomiting, abdominal pain, diarrhea and constipation.  Genitourinary: Positive for dysuria and frequency. Negative for hematuria.  Musculoskeletal: Negative for back pain, myalgias, neck pain and neck stiffness.  Skin: Negative for rash and wound.  Neurological: Positive for dizziness, light-headedness and numbness. Negative for syncope, weakness and headaches.  All other systems reviewed and are negative.      Allergies  Review of patient's allergies indicates no known allergies.  Home Medications   Current Outpatient Rx  Name  Route  Sig  Dispense  Refill  . amLODipine (NORVASC) 10 MG tablet   Oral   Take 10 mg by mouth daily.         . metFORMIN (GLUCOPHAGE) 500 MG tablet   Oral   Take 500 mg by mouth 2 (two) times daily with a meal.         . methocarbamol (ROBAXIN) 500 MG tablet   Oral   Take 1 tablet (500 mg total) by mouth 2 (two) times daily.   20 tablet   0   . naproxen (NAPROSYN) 500 MG tablet   Oral   Take 1 tablet (500 mg total) by mouth 2 (two) times daily.   30 tablet  0    There were no vitals taken for this visit. Physical Exam  Nursing note and vitals reviewed. Constitutional: She is oriented to person, place, and time. She appears well-developed and well-nourished. No distress.  HENT:  Head: Normocephalic and atraumatic.  Mouth/Throat: Oropharynx is clear and moist. No oropharyngeal exudate.  Eyes: EOM are normal. Pupils are equal, round, and reactive to light.  Neck: Normal range of motion. Neck supple.  Cardiovascular: Normal rate and regular rhythm.  Exam reveals no gallop and no friction rub.   No murmur heard. Pulmonary/Chest: Effort normal and breath sounds normal. No respiratory distress. She has no wheezes. She  has no rales.  Abdominal: Soft. Bowel sounds are normal. She exhibits no distension and no mass. There is no tenderness. There is no rebound and no guarding.  Musculoskeletal: Normal range of motion. She exhibits no edema and no tenderness.  No CVA tenderness bilaterally.  Neurological: She is alert and oriented to person, place, and time.  Patient is alert and oriented x3 with clear, goal oriented speech. Patient has 5/5 motor in all extremities. Sensation is intact to light touch.   Skin: Skin is warm and dry. No rash noted. No erythema.  Psychiatric: She has a normal mood and affect. Her behavior is normal.    ED Course  Procedures (including critical care time) Labs Review Labs Reviewed  GLUCOSE, CAPILLARY - Abnormal; Notable for the following:    Glucose-Capillary 30 (*)    All other components within normal limits  GLUCOSE, CAPILLARY  CBC WITH DIFFERENTIAL  COMPREHENSIVE METABOLIC PANEL  URINALYSIS, ROUTINE W REFLEX MICROSCOPIC   Imaging Review No results found.  EKG Interpretation   None       MDM   Final diagnoses:  None   Patient remains asymptomatic in the emergency department however her blood sugars dropped from approximately 200 to 117 in a matter of 2 hours. I discussed with the hospitalist and they will admit the patient for observation.     Julianne Rice, MD 07/21/13 [phone]

## 2013-07-21 NOTE — Progress Notes (Signed)
UR Completed.  Deborah Wise [id number] 07/21/2013

## 2013-07-21 NOTE — Progress Notes (Signed)
Hypoglycemic Event  CBG: 51  Treatment: 15 GM carbohydrate snack  Symptoms: None  Follow-up CBG: Time: 0830 CBG Result: 65  Possible Reasons for Event: Unknown  Comments/MD notified: Dr. Candiss Norse glucagon ordered    Kandee Keen  Remember to initiate Hypoglycemia Order Set & complete

## 2013-07-21 NOTE — H&P (Signed)
PCP: Jon Gills   Chief Complaint:  hypoglycemia  HPI: Deborah Wise is a 63 y.o. female   has a past medical history of Hypertension; Hepatitis; Arthritis; and Diabetes mellitus without complication.   Presented with  For the past 2 days patient noted that her blood sugars started to drop. She had another episode in December. She continue to take Glipizide and metformin and haven't seen her PCP since. Patient states she has been eating well.  At home her blood sugar has been as low as 30 and she became diaphoretic and feeling dizzy and disoriented.  Patient ate some candy and came to ER. In ER she was still down to 30's and was treated with D50 but had persistently low blood sugars. She reports some back pain for the past few days, denies any fever but has occasional chills. She has hx of Hep C and ongoing alcohol abuse although denies any hx of withdrawal. Her LFT's were noted to be elevated. Hospitalist was called for admission  Review of Systems:    Pertinent positives include: chills, dizziness,  Constitutional:  No weight loss, night sweats, Fevers, fatigue, weight loss  HEENT:  No headaches, Difficulty swallowing,Tooth/dental problems,Sore throat,  No sneezing, itching, ear ache, nasal congestion, post nasal drip,  Cardio-vascular:  No chest pain, Orthopnea, PND, anasarca,  palpitations.no Bilateral lower extremity swelling  GI:  No heartburn, indigestion, abdominal pain, nausea, vomiting, diarrhea, change in bowel habits, loss of appetite, melena, blood in stool, hematemesis Resp:  no shortness of breath at rest. No dyspnea on exertion, No excess mucus, no productive cough, No non-productive cough, No coughing up of blood.No change in color of mucus.No wheezing. Skin:  no rash or lesions. No jaundice GU:  no dysuria, change in color of urine, no urgency or frequency. No straining to urinate.  No flank pain.  Musculoskeletal:  No joint pain or no joint swelling. No  decreased range of motion. No back pain.  Psych:  No change in mood or affect. No depression or anxiety. No memory loss.  Neuro: no localizing neurological complaints, no tingling, no weakness, no double vision, no gait abnormality, no slurred speech, no confusion  Otherwise ROS are negative except for above, 10 systems were reviewed  Past Medical History: Past Medical History  Diagnosis Date  . Hypertension   . Hepatitis     hep C  . Arthritis   . Diabetes mellitus without complication    Past Surgical History  Procedure Laterality Date  . Broken ankle    . Multiple extractions with alveoloplasty  10/25/2011    Procedure: MULTIPLE EXTRACION WITH ALVEOLOPLASTY;  Surgeon: Gae Bon, DDS;  Location: [facility];  Service: Oral Surgery;  Laterality: Bilateral;  Extract teeth numbers one, two, six, seven, eight, nine, ten, eleven, twelve, fifteen, sixteen, eighteen, nineteen, twenty-three, twenty-four, twenty-five, twenty-seven, thirty-two and alveoplasty.  . Ankle surgery       Medications: Prior to Admission medications   Medication Sig Start Date End Date Taking? Authorizing Provider  glipiZIDE (GLUCOTROL) 5 MG tablet Take 5 mg by mouth daily before breakfast.   Yes Historical Provider, MD  metFORMIN (GLUCOPHAGE) 500 MG tablet Take 500 mg by mouth daily with breakfast.    Yes Historical Provider, MD    Allergies:  No Known Allergies  Social History:  Ambulatory  independently   Lives at  Home with family   reports that she has never smoked. She does not have any smokeless tobacco history on file. She reports  that she drinks about 0.6 ounces of alcohol per week. She reports that she does not use illicit drugs.   Family History: family history includes Diabetes type II in her mother; Hypertension in her mother.    Physical Exam: No data found.   1. General:  in No Acute distress 2. Psychological: Alert and   Oriented 3. Head/ENT:   Moist   Mucous Membranes                           Head Non traumatic, neck supple                          Normal  Dentition 4. SKIN: normal  Skin turgor,  Skin clean Dry and intact no rash 5. Heart: Regular rate and rhythm no Murmur, Rub or gallop 6. Lungs: Clear to auscultation bilaterally, no wheezes or crackles   7. Abdomen: Soft, non-tender, Non distended 8. Lower extremities: no clubbing, cyanosis, or edema 9. Neurologically strength 5 out of 5 in all 4 extremities cranial well intact no tremor noted  10. MSK: Normal range of motion  body mass index is unknown because there is no weight on file.   Labs on Admission:   Recent Labs  07/21/13 0001  NA 136*  K 3.7  CL 99  CO2 21  GLUCOSE 270*  BUN 9  CREATININE 0.58  CALCIUM 9.1    Recent Labs  07/21/13 0001  AST 272*  ALT 218*  ALKPHOS 101  BILITOT 0.6  PROT 7.5  ALBUMIN 3.3*   No results found for this basename: LIPASE, AMYLASE,  in the last 72 hours  Recent Labs  07/21/13 0001  WBC 4.9  NEUTROABS 2.6  HGB 11.5*  HCT 35.2*  MCV 75.5*  PLT 147*   No results found for this basename: CKTOTAL, CKMB, CKMBINDEX, TROPONINI,  in the last 72 hours No results found for this basename: TSH, T4TOTAL, FREET3, T3FREE, THYROIDAB,  in the last 72 hours No results found for this basename: VITAMINB12, FOLATE, FERRITIN, TIBC, IRON, RETICCTPCT,  in the last 72 hours No results found for this basename: HGBA1C    The CrCl is unknown because both a height and weight (above a minimum accepted value) are required for this calculation. ABG    Component Value Date/Time   TCO2 22 11/25/2007 1113     No results found for this basename: DDIMER     UA no evidence of infection    Cultures: No results found for this basename: sdes, specrequest, cult, reptstatus       Radiological Exams on Admission: No results found.  Chart has been reviewed  Assessment/Plan  This is a 63 year old female history of diabetes and hepatitis C, also history of alcohol abuse  past 30 years, presents with hypoglycemia episodes which are persistent  Present on Admission:  . Hypoglycemia - in the setting of being on glipizide and possibly empirically levo function. Will start glipizide obtain hemoglobin A1c to determine severity of diabetes.IV D 5 1/2 NS Hypoglycemic protocol. Worsening liver function could be contributing to hypoglycemia  . Diabetes mellitus - hold glipizide and metformin given hypoglycemia metformin could be restarted at a later time  . Hypertension - current medications we'll continue to monitor  . Liver disease - combination of Hepatitis C and alcohol abuse, will obtain liver US to evaluate for cirrhosis  . Alcohol abuse - The past 30 years he  reports that at least half a bottle of wine plus some beer per day.write for CIWA protocol. Thiamin IV    Prophylaxis: SCD , Protonix  CODE STATUS: FULL OCDE  Other plan as per orders.  I have spent a total of 55 min on this admission  DOUTOVA,ANASTASSIA 07/21/2013, 3:39 AM

## 2013-07-21 NOTE — Progress Notes (Signed)
Patient Demographics  Deborah Wise, is a 63 y.o. female, DOB - 09-18-1950, QQI:[id number]  Admit date - 07/20/2013   Admitting Physician Toy Baker, MD  Outpatient Primary MD for the patient is Pcp Not In System  LOS - 1   Chief Complaint  Patient presents with  . Hypoglycemia        Assessment & Plan    . Hypoglycemia in a patient with type 2 diabetes mellitus - in the setting of being on glipizide with reduced oral intake due to alcoholism. Diabetic medications held, gentle D5 drip, one-time IV glucagon, check A1c and monitor CBGs serially.   No results found for this basename: HGBA1C    CBG (last 3)   Recent Labs  07/21/13 0758 07/21/13 0831 07/21/13 1021  GLUCAP 51* 65* 117*          . Hypertension - continue on Norvasc along with as needed hydralazine.      . Liver disease - combination of Hepatitis C and alcohol abuse, will obtain liver US to evaluate for cirrhosis      . Alcohol abuse - The past 30 years he reports that at least half a bottle of wine plus some beer per day. Continue CIWA protocol. Neck acid and thiamine supplementation. Counseled to quit alcohol      Code Status: Full  Family Communication:    Disposition Plan:  Home   Procedures RUQ [place]   Consults      Medications  Scheduled Meds: . amLODipine  5 mg Oral Daily  . docusate sodium  100 mg Oral BID  . folic acid  1 mg Oral Daily  . hydrALAZINE  10 mg Intravenous Once  . LORazepam  0-4 mg Intravenous Q6H   Followed by  . [START ON 07/23/2013] LORazepam  0-4 mg Intravenous Q12H  . multivitamin with minerals  1 tablet Oral Daily  . pantoprazole  40 mg Oral Q1200  . sodium chloride  3 mL Intravenous Q12H  . thiamine  100 mg Oral Daily   Or  . thiamine  100 mg  Intravenous Daily   Continuous Infusions: . dextrose 5 % and 0.45% NaCl 75 mL/hr at 07/21/13 0451   PRN Meds:.acetaminophen, acetaminophen, dextrose, dextrose, glucose-Vitamin C, HYDROcodone-acetaminophen, LORazepam, LORazepam, ondansetron (ZOFRAN) IV, ondansetron  DVT Prophylaxis  Heparin  Lab Results  Component Value Date   PLT 163 07/21/2013    Antibiotics     Anti-infectives   None          Subjective:   Deborah Wise today has, No headache, No chest pain, No abdominal pain - No Nausea, No new weakness tingling or numbness, No Cough - SOB.    Objective:   Filed Vitals:   07/21/13 0000 07/21/13 0431 07/21/13 0625 07/21/13 1025  BP: 154/79 198/86 181/83 146/103  Pulse:  74 71 95  Temp: 97.3 F (36.3 C) 98.6 F (37 C) 98.3 F (36.8 C) 98.2 F (36.8 C)  TempSrc: Oral Oral Oral Oral  Resp: 16  18 18   Height:   5\' 5"  (1.651 m)   Weight:   75.3 kg (166 lb 0.1 oz)   SpO2: 98% 99% 100% 100%    Wt Readings from Last 3 Encounters:  07/21/13 75.3 kg (  166 lb 0.1 oz)  12/12/11 87.091 kg (192 lb)  10/19/11 88.5 kg (195 lb 1.7 oz)    No intake or output data in the 24 hours ending 07/21/13 1143   Physical Exam  Awake Alert, Oriented X 3, No new F.N deficits, Normal affect [place].AT,PERRAL Supple Neck,No JVD, No cervical lymphadenopathy appriciated.  Symmetrical Chest wall movement, Good air movement bilaterally, CTAB RRR,No Gallops,Rubs or new Murmurs, No Parasternal Heave +ve B.Sounds, Abd Soft, Non tender, No organomegaly appriciated, No rebound - guarding or rigidity. No Cyanosis, Clubbing or edema, No new Rash or bruise      Data Review   Micro Results No results found for this or any previous visit (from the past 240 hour(s)).  Radiology Reports No results found.  CBC  Recent Labs Lab 07/21/13 0001 07/21/13 0720  WBC 4.9 5.6  HGB 11.5* 11.7*  HCT 35.2* 35.2*  PLT 147* 163  MCV 75.5* 74.9*  MCH 24.7* 24.9*  MCHC 32.7 33.2  RDW 15.9* 15.5    LYMPHSABS 1.7  --   MONOABS 0.6  --   EOSABS 0.0  --   BASOSABS 0.0  --     Chemistries   Recent Labs Lab 07/21/13 0001 07/21/13 0720  NA 136* 141  K 3.7 3.9  CL 99 100  CO2 21 27  GLUCOSE 270* 58*  BUN 9 10  CREATININE 0.58 0.50  CALCIUM 9.1 9.4  MG  --  2.0  AST 272* 229*  ALT 218* 194*  ALKPHOS 101 98  BILITOT 0.6 1.0   ------------------------------------------------------------------------------------------------------------------ estimated creatinine clearance is 75 ml/min (by C-G formula based on Cr of 0.5). ------------------------------------------------------------------------------------------------------------------ No results found for this basename: HGBA1C,  in the last 72 hours ------------------------------------------------------------------------------------------------------------------ No results found for this basename: CHOL, HDL, LDLCALC, TRIG, CHOLHDL, LDLDIRECT,  in the last 72 hours ------------------------------------------------------------------------------------------------------------------ No results found for this basename: TSH, T4TOTAL, FREET3, T3FREE, THYROIDAB,  in the last 72 hours ------------------------------------------------------------------------------------------------------------------ No results found for this basename: VITAMINB12, FOLATE, FERRITIN, TIBC, IRON, RETICCTPCT,  in the last 72 hours  Coagulation profile  Recent Labs Lab 07/21/13 0355  INR 1.00    No results found for this basename: DDIMER,  in the last 72 hours  Cardiac Enzymes No results found for this basename: CK, CKMB, TROPONINI, MYOGLOBIN,  in the last 168 hours ------------------------------------------------------------------------------------------------------------------ No components found with this basename: POCBNP,      Time Spent in minutes 35   Lala Lund K M.D on 07/21/2013 at 11:43 AM  Between 7am to 7pm - Pager -  [phone]  After 7pm go to [email or web address] - password [email or web address]  And look for the night coverage person covering for me after hours  [facility] Office  [phone]

## 2013-07-21 NOTE — ED Notes (Addendum)
Checked Pt CBG is 197mg /dL

## 2013-07-22 ENCOUNTER — Inpatient Hospital Stay (HOSPITAL_COMMUNITY): Payer: Medicaid Other

## 2013-07-22 LAB — GLUCOSE, CAPILLARY: Glucose-Capillary: 142 mg/dL — ABNORMAL HIGH (ref 70–99)

## 2013-07-22 MED ORDER — FOLIC ACID 1 MG PO TABS: 1.0000 mg | ORAL_TABLET | Freq: Every day | ORAL | Status: DC

## 2013-07-22 MED ORDER — FREESTYLE SYSTEM KIT: 1.0000 | PACK | Freq: Three times a day (TID) | Status: DC

## 2013-07-22 MED ORDER — GLIPIZIDE 2.5 MG HALF TABLET: 2.5000 mg | ORAL_TABLET | Freq: Every day | ORAL | Status: DC

## 2013-07-22 MED ORDER — VITAMIN B-1 100 MG PO TABS: 100.0000 mg | ORAL_TABLET | Freq: Every day | ORAL | Status: DC

## 2013-07-22 MED ORDER — AMLODIPINE BESYLATE 5 MG PO TABS: 5.0000 mg | ORAL_TABLET | Freq: Every day | ORAL | Status: DC

## 2013-07-22 MED ORDER — GLIPIZIDE 2.5 MG HALF TABLET: 5.0000 mg | ORAL_TABLET | Freq: Every day | ORAL | Status: DC

## 2013-07-22 NOTE — Progress Notes (Signed)
Pt given discharge instructions and PIV removed.  Denied any needs at this time.  Pt taken to discharge location via wheelchair.

## 2013-07-22 NOTE — Discharge Instructions (Signed)
Follow with Primary MD in 7 days   Accuchecks 4 times/day, Once in AM empty stomach and then before each meal. Log in all results and show them to your Prim.MD in 3 days. If any glucose reading is under 80 or above 300 call your Prim MD immidiately. Follow Low glucose instructions for glucose under 80 as instructed.   Get CBC, CMP, checked 7 days by Primary MD and again as instructed by your Primary MD. Get a 2 view Chest X ray done next visit if you had Pneumonia of Lung problems at the Hospital.   Activity: As tolerated with Full fall precautions use walker/cane & assistance as needed   Disposition Home     Diet: Heart Healthy - Low Carb  For Heart failure patients - Check your Weight same time everyday, if you gain over 2 pounds, or you develop in leg swelling, experience more shortness of breath or chest pain, call your Primary MD immediately. Follow Cardiac Low Salt Diet and 1.8 lit/day fluid restriction.   On your next visit with her primary care physician please Get Medicines reviewed and adjusted.  Please request your Prim.MD to go over all Hospital Tests and Procedure/Radiological results at the follow up, please get all Hospital records sent to your Prim MD by signing hospital release before you go home.   If you experience worsening of your admission symptoms, develop shortness of breath, life threatening emergency, suicidal or homicidal thoughts you must seek medical attention immediately by calling 911 or calling your MD immediately  if symptoms less severe.  You Must read complete instructions/literature along with all the possible adverse reactions/side effects for all the Medicines you take and that have been prescribed to you. Take any new Medicines after you have completely understood and accpet all the possible adverse reactions/side effects.   Do not drive and provide baby sitting services if your were admitted for syncope or siezures until you have seen by Primary  MD or a Neurologist and advised to do so again.  Do not drive when taking Pain medications.    Do not take more than prescribed Pain, Sleep and Anxiety Medications  Special Instructions: If you have smoked or chewed Tobacco  in the last 2 yrs please stop smoking, stop any regular Alcohol  and or any Recreational drug use.  Wear Seat belts while driving.   Please note  You were cared for by a hospitalist during your hospital stay. If you have any questions about your discharge medications or the care you received while you were in the hospital after you are discharged, you can call the unit and asked to speak with the hospitalist on call if the hospitalist that took care of you is not available. Once you are discharged, your primary care physician will handle any further medical issues. Please note that NO REFILLS for any discharge medications will be authorized once you are discharged, as it is imperative that you return to your primary care physician (or establish a relationship with a primary care physician if you do not have one) for your aftercare needs so that they can reassess your need for medications and monitor your lab values.    Alcohol and Nutrition Nutrition serves two purposes. It provides energy. It also maintains body structure and function. Food supplies energy. It also provides the building blocks needed to replace worn or damaged cells. Alcoholics often eat poorly. This limits their supply of essential nutrients. This affects energy supply and structure maintenance. Alcohol  also affects the body's nutrients in:  Digestion.  Storage.  Using and getting rid of waste products. IMPAIRMENT OF NUTRIENT DIGESTION AND UTILIZATION   Once ingested, food must be broken down into small components (digested). Then it is available for energy. It helps maintain body structure and function. Digestion begins in the mouth. It continues in the stomach and intestines, with help from the  pancreas. The nutrients from digested food are absorbed from the intestines into the blood. Then they are carried to the liver. The liver prepares nutrients for:  Immediate use.  Storage and future use.  Alcohol inhibits the breakdown of nutrients into usable molecules.  It decreases secretion of digestive enzymes from the pancreas.  Alcohol impairs nutrient absorption by damaging the cells lining the stomach and intestines.  It also interferes with moving some nutrients into the blood.  In addition, nutritional deficiencies themselves may lead to further absorption problems.  For example, folate deficiency changes the cells that line the small intestine. This impairs how water is absorbed. It also affects absorbed nutrients. These include glucose, sodium, and additional folate.  Even if nutrients are digested and absorbed, alcohol can prevent them from being fully used. It changes their transport, storage, and excretion. Impaired utilization of nutrients by alcoholics is indicated by:  Decreased liver stores of vitamins, such as vitamin A.  Increased excretion of nutrients such as fat. ALCOHOL AND ENERGY SUPPLY   Three basic nutritional components found in food are:  Carbohydrates.  Proteins.  Fats.  These are used as energy. Some alcoholics take in as much as 50% of their total daily calories from alcohol. They often neglect important foods.  Even when enough food is eaten, alcohol can impair the ways the body controls blood sugar (glucose) levels. It may either increase or decrease blood sugar.  In non-diabetic alcoholics, increased blood sugar (hyperglycemia) is caused by poor insulin secretion. It is usually temporary.  Decreased blood sugar (hypoglycemia) can cause serious injury even if this condition is short-lived. Low blood sugar can happen when a fasting or malnourished person drinks alcohol. When there is no food to supply energy, stored sugar is used up. The  products of alcohol inhibit forming glucose from other compounds such as amino acids. As a result, alcohol causes the brain and other body tissue to lack glucose. It is needed for energy and function.  Alcohol is an energy source. But how the body processes and uses the energy from alcohol is complex. Also, when alcohol is substituted for carbohydrates, subjects tend to lose weight. This indicates that they get less energy from alcohol than from food. ALCOHOL - MAINTAINING CELL STRUCTURE AND FUNCTION  Structure Cells are made mostly of protein. So an adequate protein diet is important for maintaining cell structure. This is especially true if cells are being damaged. Research indicates that alcohol affects protein nutrition by causing impaired:  Digestion of proteins to amino acids.  Processing of amino acids by the small intestine and liver.  Synthesis of proteins from amino acids.  Protein secretion by the liver. Function Nutrients are essential for the body to function well. They provide the tools that the body needs to work well:   Proteins.  Vitamins.  Minerals. Alcohol can disrupt body function. It may cause nutrient deficiencies. And it may interfere with the way nutrients are processed. Vitamins  Vitamins are essential to maintain growth and normal metabolism. They regulate many of the body`s processes. Chronic heavy drinking causes deficiencies in many vitamins.  This is caused by eating less. And, in some cases, vitamins may be poorly absorbed. For example, alcohol inhibits fat absorption. It impairs how the vitamins A, E, and D are normally absorbed along with dietary fats. Not enough vitamin A may cause night blindness. Not enough vitamin D may cause softening of the bones.  Some alcoholics lack vitamins A, C, D, E, K, and the B vitamins. These are all involved in wound healing and cell maintenance. In particular, because vitamin K is necessary for blood clotting, lacking that  vitamin can cause delayed clotting. The result is excess bleeding. Lacking other vitamins involved in brain function may cause severe neurological damage. Minerals Deficiencies of minerals such as calcium, magnesium, iron, and zinc are common in alcoholics. The alcohol itself does not seem to affect how these minerals are absorbed. Rather, they seem to occur secondary to other alcohol-related problems, such as:  Less calcium absorbed.  Not enough magnesium.  More urinary excretion.  Vomiting.  Diarrhea.  Not enough iron due to gastrointestinal bleeding.  Not enough zinc or losses related to other nutrient deficiencies.  Mineral deficiencies can cause a variety of medical consequences. These range from calcium-related bone disease to zinc-related night blindness and skin lesions. ALCOHOL, MALNUTRITION, AND MEDICAL COMPLICATIONS  Liver Disease   Alcoholic liver damage is caused primarily by alcohol itself. But poor nutrition may increase the risk of alcohol-related liver damage. For example, nutrients normally found in the liver are known to be affected by drinking alcohol. These include carotenoids, which are the major sources of vitamin A, and vitamin E compounds. Decreases in such nutrients may play some role in alcohol-related liver damage. Pancreatitis  Research suggests that malnutrition may increase the risk of developing alcoholic pancreatitis. Research suggests that a diet lacking in protein may increase alcohol's damaging effect on the pancreas. Brain  Nutritional deficiencies may have severe effects on brain function. These may be permanent. Specifically, thiamine deficiencies are often seen in alcoholics. They can cause severe neurological problems. These include:  Impaired movement.  Memory loss seen in Wernicke-Korsakoff syndrome. Pregnancy  Alcohol has toxic effects on fetal development. It causes alcohol-related birth defects. They include fetal alcohol syndrome.  Alcohol itself is toxic to the fetus. Also, the nutritional deficiency can affect how the fetus develops. That may compound the risk of developmental damage.  Nutritional needs during pregnancy are 10% to 30% greater than normal. Food intake can increase by as much as 140% to cover the needs of both mother and fetus. An alcoholic mother`s nutritional problems may adversely affect the nutrition of the fetus. And alcohol itself can also restrict nutrition flow to the fetus. NUTRITIONAL STATUS OF ALCOHOLICS  Techniques for assessing nutritional status include:  Taking body measurements to estimate fat reserves. They include:  Weight.  Height.  Mass.  Skin fold thickness.  Performing blood analysis to provide measurements of circulating:  Proteins.  Vitamins.  Minerals.  These techniques tend to be imprecise. For many nutrients, there is no clear "cut-off" point that would allow an accurate definition of deficiency. So assessing the nutritional status of alcoholics is limited by these techniques. Dietary status may provide information about the risk of developing nutritional problems. Dietary status is assessed by:  Taking patients' dietary histories.  Evaluating the amount and types of food they are eating.  It is difficult to determine what exact amount of alcohol begins to have damaging effects on nutrition. In general, moderate drinkers have 2 drinks or less per day. They  seem to be at little risk for nutritional problems. Various medical disorders begin to appear at greater levels.  Research indicates that the majority of even the heaviest drinkers have few obvious nutritional deficiencies. Many alcoholics who are hospitalized for medical complications of their disease do have severe malnutrition. Alcoholics tend to eat poorly. Often they eat less than the amounts of food necessary to provide enough:  Carbohydrates.  Protein.  Fat.  Vitamins A and C.  B  vitamins.  Minerals like calcium and iron. Of major concern is alcohol's effect on digesting food and use of nutrients. It may shift a mildly malnourished person toward severe malnutrition. Document Released: 03/18/2005 Document Revised: 08/16/2011 Document Reviewed: 09/01/2005 [facility] Patient Information 2014 [place].  How Much is Too Much Alcohol? Drinking too much alcohol can cause injury, accidents, and health problems. These types of problems can include:   Car crashes.  Falls.  Family fighting (domestic violence).  Drowning.  Fights.  Injuries.  Burns.  Damage to certain organs.  Having a baby with birth defects. ONE DRINK CAN BE TOO MUCH WHEN YOU ARE:  Working.  Pregnant or breastfeeding.  Taking medicines. Ask your doctor.  Driving or planning to drive. WHAT IS A STANDARD DRINK?   1 regular beer (12 ounces or 360 milliliters).  1 glass of wine (5 ounces or 150 milliliters).  1 shot of liquor (1.5 ounces or 45 milliliters). BLOOD ALCOHOL LEVELS   .00 A person is sober.  [name] Kitchen03 A person has no trouble keeping balance, talking, or seeing right, but a "buzz" may be felt.  [name] Kitchen05 A person feels "buzzed" and relaxed.  [name] Kitchen08 or .10  A person is drunk. He or she has trouble talking, seeing right, and keeping his or her balance.  .15 A person loses body control and may pass out (blackout).  .20 A person has trouble walking (staggering) and throws up (vomits).  .30 A person will pass out (unconscious).  .40+ A person will be in a coma. Death is possible. If you or someone you know has a drinking problem, get help from a doctor.  Document Released: 03/20/2009 Document Revised: 08/16/2011 Document Reviewed: 03/20/2009 [facility] Patient Information 2014 [place].  How to Avoid Diabetes Problems You can do a lot to prevent or slow down diabetes problems. Following your diabetes plan and taking care of yourself can reduce your risk of serious or  life-threatening complications. Below, you will find certain things you can do to prevent diabetes problems. MANAGE YOUR DIABETES Follow your caregiver's, nurse educator's, and dietitian's instructions for managing your diabetes. They will teach you the basics of diabetes care. They can help answer questions you may have. Learn about diabetes and make healthy choices regarding eating and physical activity. Monitor your blood glucose level regularly. Your caregiver will help you decide how often to check your blood glucose level depending on your treatment goals and how well you are meeting them.  DO NOT SMOKE Smoking and diabetes are a dangerous combination. Smoking raises your risk for diabetes problems. If you quit smoking, you will lower your risk for heart attack, stroke, nerve disease, and kidney disease. Your cholesterol and your blood pressure levels may improve. Your blood circulation will also improve. If you smoke, ask your caregiver for help in quitting. KEEP YOUR BLOOD PRESSURE UNDER CONTROL Keeping your blood pressure under control will help prevent damage to your eyes, kidneys, heart, and blood vessels. Blood pressure consists of two numbers. The top number should be  below 120, and the bottom number should be below 80 (120/80). Keep your blood pressure as close to these numbers as you can. If you already have kidney disease, you may want even lower blood pressure to protect your kidneys. Talk to your caregiver to make sure that your blood pressure goal is right for your needs. Meal planning, medicines, and exercise can help you reach your blood pressure target. Have your blood pressure checked at every visit with your caregiver. KEEP YOUR CHOLESTEROL UNDER CONTROL Normal cholesterol levels will help prevent heart disease and stroke. These are the biggest health problems for people with diabetes. Keeping cholesterol levels under control can also help with blood flow. Have your cholesterol level  checked at least once a year. Meal planning, exercise, and medicines can help you reach your cholesterol targets. SCHEDULE AND KEEP YOUR ANNUAL PHYSICAL EXAMS AND EYE EXAMS Your caregiver will tell you how often he or she wants to see you depending on your plan of treatment. It is important that you keep these appointments so that possible problems can be identified early and complications can be avoided or treated.  Every visit with your caregiver should include your weight, blood pressure, and an evaluation of your blood glucose control.  Your hemoglobin A1c should be checked:  At least twice a year if you are at your goal.  Every 3 months if there are changes in treatment.  If you are not meeting your goals.  Your blood lipids should be checked yearly. You should also be checked yearly to see if you have protein in your urine (microalbumin).  Schedule a dilated eye exam if you have type 1 diabetes within 5 years of your diagnosis and then yearly. Schedule a dilated eye exam if you have type 2 diabetes at diagnosis and then yearly. All exams thereafter can be extended to every 2 to 3 years if one or more exams have been normal. KEEP YOUR VACCINES CURRENT The flu vaccine is recommended yearly. The formula for the vaccine changes every year and needs to be updated for the best protection against current viruses. In addition, you should get a vaccination against pneumonia at least once in your life. However, there are some instances where another vaccine is recommended. Check with your caregiver. TAKE CARE OF YOUR FEET  Diabetes may cause you to have a poor blood supply (circulation) to your legs and feet. Because of this, the skin may be thinner, break easier, and heal more slowly. You also may have nerve damage in your legs and feet causing decreased feeling. You may not notice minor injuries to your feet that could lead to serious problems or infections. Taking care of your feet is very  important. Visual foot exams are performed at every routine medical visit. The exams check for cuts, injuries, or other problems with the feet. A comprehensive foot exam should be done yearly. This includes visual inspection as well as assessing foot pulses and testing for loss of sensation. You should also do the following:  Inspect your feet daily for cuts, calluses, blisters, ingrown toenails, and signs of infection, such as redness, swelling, or pus.  Wash and dry your feet thoroughly, especially between the toes.  Avoid soaking your feet regularly in hot water baths.  Moisturize dry skin with lotion, avoiding areas between your toes.  Cut toenails straight across and file the edges.  Avoid shoes that do not fit well or have areas that irritate your skin.  Avoid going barefooted or  wearing only socks. Your feet need protection. TAKE CARE OF YOUR TEETH People with poorly controlled diabetes are more likely to have gum (periodontal) disease. These infections make diabetes harder to control. Periodontal diseases, if left untreated, can lead to tooth loss. Brush your teeth twice a day, floss, and see your dentist for checkups and cleaning every 6 months, or 2 times a year. ASK YOUR CAREGIVER ABOUT TAKING ASPIRIN Taking aspirin daily is recommended to help prevent cardiovascular disease in people with and without diabetes. Ask your caregiver if this would benefit you and what dose he or she would recommend. DRINK RESPONSIBLY Moderate amounts of alcohol (less than 1 drink per day for adult women and less than 2 drinks per day for adult men) have a minimal effect on blood glucose if ingested with food. It is important to eat food with alcohol to avoid hypoglycemia. People should avoid alcohol if they have a history of alcohol abuse or dependence, if they are pregnant, and if they have liver disease, pancreatitis, advanced neuropathy, or severe hypertriglyceridemia. LESSEN STRESS Living with  diabetes can be stressful. When you are under stress, your blood glucose may be affected in two ways:  Stress hormones may cause your blood glucose to rise.  You may be distracted from taking good care of yourself. It is a good idea to be aware of your stress level and make changes that are necessary to help you better manage challenging situations. Support groups, planned relaxation, a hobby you enjoy, meditation, healthy relationships, and exercise all work to lower your stress level. If your efforts do not seem to be helping, get help from your caregiver or a trained mental health professional. Document Released: 02/09/2011 Document Revised: 05/10/2012 Document Reviewed: 02/09/2011 [employer] Patient Information 2014 [place], [place].  Hypertension Hypertension is another name for high blood pressure. High blood pressure may mean that your heart needs to work harder to pump blood. Blood pressure consists of two numbers, which includes a higher number over a lower number (example: 110/72). HOME CARE   Make lifestyle changes as told by your doctor. This may include weight loss and exercise.  Take your blood pressure medicine every day.  Limit how much salt you use.  Stop smoking if you smoke.  Do not use drugs.  Talk to your doctor if you are using decongestants or birth control pills. These medicines might make blood pressure higher.  Females should not drink more than 1 alcoholic drink per day. Males should not drink more than 2 alcoholic drinks per day.  See your doctor as told. GET HELP RIGHT AWAY IF:   You have a blood pressure reading with a top number of 180 or higher.  You get a very bad headache.  You get blurred or changing vision.  You feel confused.  You feel weak, numb, or faint.  You get chest or belly (abdominal) pain.  You throw up (vomit).  You cannot breathe very well. MAKE SURE YOU:   Understand these instructions.  Will watch your condition.  Will  get help right away if you are not doing well or get worse. Document Released: 11/10/2007 Document Revised: 08/16/2011 Document Reviewed: 11/10/2007 [facility] Patient Information 2014 [place], [place].  Low Blood Sugar Low blood sugar (hypoglycemia) means that the level of sugar in your blood is lower than it should be. Signs of low blood sugar include:  Getting sweaty.  Feeling hungry.  Feeling dizzy or weak.  Feeling sleepier than normal.  Feeling nervous.  Headaches.  Having a fast heartbeat. Low blood sugar can happen fast and can be an emergency. Your doctor can do tests to check your blood sugar level. You can have low blood sugar and not have diabetes. HOME CARE  Check your blood sugar as told by your doctor. If it is less than 70 mg/dl or as told by your doctor, take 1 of the following:  3 to 4 glucose tablets.   cup clear juice.   cup soda pop, not diet.  1 cup milk.  5 to 6 hard candies.  Recheck blood sugar after 15 minutes. Repeat until it is at the right level.  Eat a snack if it is more than 1 hour until the next meal.  Only take medicine as told by your doctor.  Do not skip meals. Eat on time.  Do not drink alcohol except with meals.  Check your blood glucose before driving.  Check your blood glucose before and after exercise.  Always carry treatment with you, such as glucose pills.  Always wear a medical alert bracelet if you have diabetes. GET HELP RIGHT AWAY IF:   Your blood glucose goes below 70 mg/dl or as told by your doctor, and you:  Are confused.  Are not able to swallow.  Pass out (faint).  You cannot treat yourself. You may need someone to help you.  You have low blood sugar problems often.  You have problems from your medicines.  You are not feeling better after 3 to 4 days.  You have vision changes. MAKE SURE YOU:   Understand these instructions.  Will watch this condition.  Will get help right away if you are  not doing well or get worse. Document Released: 08/18/2009 Document Revised: 08/16/2011 Document Reviewed: 08/18/2009 [facility] Patient Information 2014 [place].

## 2013-07-22 NOTE — Discharge Summary (Signed)
Deborah Wise, is a 63 y.o. female  DOB [birth year]  MRN [id number].  Admission date:  07/20/2013  Admitting Physician  Toy Baker, MD  Discharge Date:  07/22/2013   Primary MD  Pcp Not In System  Recommendations for primary care physician for things to follow:   Follow glycemic control and GI    Admission Diagnosis  Liver disease [573.9] Hypoglycemia [251.2] Hypertension [401.9] Diabetes mellitus [250.00]   Discharge Diagnosis  Liver disease [573.9] Hypoglycemia [251.2] Hypertension [401.9] Diabetes mellitus [250.00]    Active Problems:   Hypoglycemia   Diabetes mellitus   Hypertension   Liver disease   Alcohol abuse      Past Medical History  Diagnosis Date  . Hypertension   . Hepatitis     hep C  . Arthritis   . Diabetes mellitus without complication     Past Surgical History  Procedure Laterality Date  . Broken ankle    . Multiple extractions with alveoloplasty  10/25/2011    Procedure: MULTIPLE EXTRACION WITH ALVEOLOPLASTY;  Surgeon: Gae Bon, DDS;  Location: [facility];  Service: Oral Surgery;  Laterality: Bilateral;  Extract teeth numbers one, two, six, seven, eight, nine, ten, eleven, twelve, fifteen, sixteen, eighteen, nineteen, twenty-three, twenty-four, twenty-five, twenty-seven, thirty-two and alveoplasty.  . Ankle surgery       Discharge Condition: stable   Follow UP  Follow-up Information   Follow up with PCP. Schedule an appointment as soon as possible for a visit in 1 week.      Follow up with Silvano Rusk, MD. Schedule an appointment as soon as possible for a visit in 1 week. (Evaluation for possible cirrhosis)    Specialty:  Gastroenterology   Contact information:   [address] [place] [address] [phone]         Discharge Instructions  and  Discharge  Medications         Discharge Orders   Future Orders Complete By Expires   Discharge instructions  As directed    Comments:     Follow with Primary MD in 7 days   Accuchecks 4 times/day, Once in AM empty stomach and then before each meal. Log in all results and show them to your Prim.MD in 3 days. If any glucose reading is under 80 or above 300 call your Prim MD immidiately. Follow Low glucose instructions for glucose under 80 as instructed.   Get CBC, CMP, checked 7 days by Primary MD and again as instructed by your Primary MD. Get a 2 view Chest X ray done next visit if you had Pneumonia of Lung problems at the Hospital.   Activity: As tolerated with Full fall precautions use walker/cane & assistance as needed   Disposition Home     Diet: Heart Healthy - Low Carb  For Heart failure patients - Check your Weight same time everyday, if you gain over 2 pounds, or you develop in leg swelling, experience more shortness of breath or chest pain, call your Primary MD immediately.  Follow Cardiac Low Salt Diet and 1.8 lit/day fluid restriction.   On your next visit with her primary care physician please Get Medicines reviewed and adjusted.  Please request your Prim.MD to go over all Hospital Tests and Procedure/Radiological results at the follow up, please get all Hospital records sent to your Prim MD by signing hospital release before you go home.   If you experience worsening of your admission symptoms, develop shortness of breath, life threatening emergency, suicidal or homicidal thoughts you must seek medical attention immediately by calling 911 or calling your MD immediately  if symptoms less severe.  You Must read complete instructions/literature along with all the possible adverse reactions/side effects for all the Medicines you take and that have been prescribed to you. Take any new Medicines after you have completely understood and accpet all the possible adverse reactions/side  effects.   Do not drive and provide baby sitting services if your were admitted for syncope or siezures until you have seen by Primary MD or a Neurologist and advised to do so again.  Do not drive when taking Pain medications.    Do not take more than prescribed Pain, Sleep and Anxiety Medications  Special Instructions: If you have smoked or chewed Tobacco  in the last 2 yrs please stop smoking, stop any regular Alcohol  and or any Recreational drug use.  Wear Seat belts while driving.   Please note  You were cared for by a hospitalist during your hospital stay. If you have any questions about your discharge medications or the care you received while you were in the hospital after you are discharged, you can call the unit and asked to speak with the hospitalist on call if the hospitalist that took care of you is not available. Once you are discharged, your primary care physician will handle any further medical issues. Please note that NO REFILLS for any discharge medications will be authorized once you are discharged, as it is imperative that you return to your primary care physician (or establish a relationship with a primary care physician if you do not have one) for your aftercare needs so that they can reassess your need for medications and monitor your lab values.   Increase activity slowly  As directed        Medication List         amLODipine 5 MG tablet  Commonly known as:  NORVASC  Take 5 mg by mouth daily.     folic acid 1 MG tablet  Commonly known as:  FOLVITE  Take 1 tablet (1 mg total) by mouth daily.     glipiZIDE 2.5 mg Tabs tablet  Commonly known as:  GLUCOTROL  Take 1 tablet (5 mg total) by mouth daily before breakfast.     glucose monitoring kit monitoring kit  1 each by Does not apply route 4 (four) times daily - after meals and at bedtime. 1 month Diabetic Testing Supplies for QAC-QHS accuchecks.Any brand OK     metFORMIN 500 MG tablet  Commonly known as:   GLUCOPHAGE  Take 500 mg by mouth daily with breakfast.     thiamine 100 MG tablet  Commonly known as:  VITAMIN B-1  Take 1 tablet (100 mg total) by mouth daily.          Diet and Activity recommendation: See Discharge Instructions above   Consults obtained -    Major procedures and Radiology Reports - PLEASE review detailed and final reports for all details,  in brief -      No results found.  Micro Results     No results found for this or any previous visit (from the past 240 hour(s)).   History of present illness and  Hospital Course:     Kindly see H&P for history of present illness and admission details, please review complete Labs, Consult reports and Test reports for all details in brief Deborah Wise, is a 63 y.o. female, patient with history of diabetes mellitus type 2 who is on oral hypoglycemic agents and metformin, alcohol abuse, hepatitis C, DJD, hypertension was admitted to the hospital when she noticed that her blood sugars were running low. Patient reportedly skipped a few meals that day, which she confirms today. In the hospital she received a dose of IV glucagon, D5W IV, subsequently her sugar stabilized.    She has been on diabetic diet along with 2.5 Glucotrol and sliding scale insulin with stable CBGs, she will be placed on lowered dose Glucotrol from 5 mg to 2.5 mg daily, she will continue her metformin, glucose testing supplies have been given to the patient and she has been advised check her sugars q. a.c. at bedtime. She symptom-free he needed to go home.     Is been advised to follow with GI outpatient for history of hepatitis C, alcohol abuse and elevated LFTs for possible cirrhosis evaluation.   Is been counseled to quit alcohol, will be placed on folic acid and thiamine. No signs of DTs.    Today   Subjective:   Deborah Wise today has no headache,no chest abdominal pain,no new weakness tingling or numbness, feels much better wants  to go home today.   Objective:   Blood pressure 158/70, pulse 71, temperature 98.3 F (36.8 C), temperature source Oral, resp. rate 17, height 5' 5"  (1.651 m), weight 75.3 kg (166 lb 0.1 oz), SpO2 100.00%.   Intake/Output Summary (Last 24 hours) at 07/22/13 0836 Last data filed at 07/21/13 2155  Gross per 24 hour  Intake    120 ml  Output      0 ml  Net    120 ml    Exam Awake Alert, Oriented *3, No new F.N deficits, Normal affect [place].AT,PERRAL Supple Neck,No JVD, No cervical lymphadenopathy appriciated.  Symmetrical Chest wall movement, Good air movement bilaterally, CTAB RRR,No Gallops,Rubs or new Murmurs, No Parasternal Heave +ve B.Sounds, Abd Soft, Non tender, No organomegaly appriciated, No rebound -guarding or rigidity. No Cyanosis, Clubbing or edema, No new Rash or bruise  Data Review   CBC w Diff: Lab Results  Component Value Date   WBC 5.6 07/21/2013   HGB 11.7* 07/21/2013   HCT 35.2* 07/21/2013   PLT 163 07/21/2013   LYMPHOPCT 35 07/21/2013   MONOPCT 12 07/21/2013   EOSPCT 0 07/21/2013   BASOPCT 0 07/21/2013    CMP: Lab Results  Component Value Date   NA 141 07/21/2013   K 3.9 07/21/2013   CL 100 07/21/2013   CO2 27 07/21/2013   BUN 10 07/21/2013   CREATININE 0.50 07/21/2013   PROT 7.5 07/21/2013   ALBUMIN 3.3* 07/21/2013   BILITOT 1.0 07/21/2013   ALKPHOS 98 07/21/2013   AST 229* 07/21/2013   ALT 194* 07/21/2013  .   Total Time in preparing paper work, data evaluation and todays exam - 35 minutes  Thurnell Lose M.D on 07/22/2013 at 8:36 AM  [facility]  [phone]

## 2013-07-23 LAB — GLUCOSE, CAPILLARY: Glucose-Capillary: 188 mg/dL — ABNORMAL HIGH (ref 70–99)

## 2014-12-20 ENCOUNTER — Telehealth: Payer: Self-pay | Admitting: Hematology

## 2014-12-20 NOTE — Telephone Encounter (Signed)
new patient appt-s/w patient and  gave np appt for 07/19 @ 9:30 w/Dr. Irene Limbo Referring [facility] Dx-  Information scan

## 2014-12-23 ENCOUNTER — Telehealth: Payer: Self-pay | Admitting: Hematology

## 2014-12-23 NOTE — Telephone Encounter (Signed)
patient r/s appt due to transportation. 07/20 @ 9:30 w/Dr. Irene Limbo

## 2014-12-24 ENCOUNTER — Ambulatory Visit: Payer: Medicaid Other | Admitting: Hematology

## 2014-12-24 ENCOUNTER — Ambulatory Visit: Payer: Medicaid Other

## 2014-12-25 ENCOUNTER — Other Ambulatory Visit: Payer: Self-pay | Admitting: *Deleted

## 2014-12-25 ENCOUNTER — Ambulatory Visit: Payer: Medicaid Other

## 2014-12-25 ENCOUNTER — Ambulatory Visit (HOSPITAL_BASED_OUTPATIENT_CLINIC_OR_DEPARTMENT_OTHER): Payer: Medicaid Other | Admitting: Hematology

## 2014-12-25 ENCOUNTER — Encounter: Payer: Self-pay | Admitting: Hematology

## 2014-12-25 ENCOUNTER — Telehealth: Payer: Self-pay | Admitting: Hematology

## 2014-12-25 VITALS — BP 184/73 | HR 79 | Temp 98.6°F | Resp 18 | Ht 65.0 in | Wt 159.9 lb

## 2014-12-25 DIAGNOSIS — D696 Thrombocytopenia, unspecified: Secondary | ICD-10-CM | POA: Diagnosis present

## 2014-12-25 DIAGNOSIS — D509 Iron deficiency anemia, unspecified: Secondary | ICD-10-CM | POA: Diagnosis not present

## 2014-12-25 HISTORY — DX: Thrombocytopenia, unspecified: D69.6

## 2014-12-25 NOTE — Patient Instructions (Signed)
You have low platelets that might be related to ETOH or your liver disease Plan -we will run some blood tests today to help determine the cause of the low platelets and anemia -we will f/u in 8 weeks to reassess your blood counts. -Absolutely avoid any liver toxic medications -Absolutely avoid alcohol in any form due to your hepatitis, liver inflammation, low platelets -avoid Over the counter Aspirin and other NSAIDS due to increased risk of bleeding.   Thrombocytopenia Thrombocytopenia means there are not enough platelets in your blood. Platelets are tiny cells in your blood. When you start bleeding, platelets clump together around the cut or injury to stop the bleeding. This process is called blood clotting. Not having enough platelets can cause bleeding problems. HOME CARE  Check your skin and inside your mouth for bruises or blood as told by your doctor.  Check your spit (sputum), pee (urine), and poop (stool) for blood as told by your doctor.  Do not do activities that can cause bumps or bruises until your doctor says it is okay.  Be careful not to cut yourself when you shave or use scissors, needles, knives, or other tools.  Be careful not to burn yourself when you iron or cook.  Ask your doctor if you can drink alcohol.  Only take medicines as told by your doctor.  Tell all your doctors and your dentist that you have this bleeding problem. GET HELP RIGHT AWAY IF:  You are bleeding anywhere on your body.  You are bleeding or have bruises without knowing why.  You have blood in your spit, pee, or poop. MAKE SURE YOU:  Understand these instructions.  Will watch your condition.  Will get help right away if you are not doing well or get worse. Document Released: 05/13/2011 Document Revised: 08/16/2011 Document Reviewed: 05/13/2011 [facility] Patient Information 2015 [place], [place]. This information is not intended to replace advice given to you by your health care  provider. Make sure you discuss any questions you have with your health care provider.

## 2014-12-25 NOTE — Telephone Encounter (Signed)
Lft msg with lady stating pt wasn't there but that she would check with her on her schedule and I would call pt back in the morning and give her the times to come in and her updated schedule with MD in 2 months per 07/20 POF..... KJ

## 2014-12-25 NOTE — Progress Notes (Signed)
. [facility] CONSULT NOTE  Patient Care Team: Pcp Not In System as PCP - General Gautam Juleen China, MD as Consulting Physician (Hematology and Oncology)  CHIEF COMPLAINTS/PURPOSE OF CONSULTATION:  thrombocytopenia  HISTORY OF PRESENTING ILLNESS:  Deborah Wise is a 64 year old African American female with a history hepatitis, hypertension, diabetes who has been referred to [place] for consultation for evaluation and management of thrombocytopenia by her primary care physician Scott long.  She recently had a CBC on 12/12/2014 that showed a platelet count of 90,000 in some mild anemia with a hemoglobin of 11.3 and microcytosis MCV 76.  Her WBC count was 4.7k.  Labs prior to this on 11/15/2014 showed a platelet count of 99k with evidence of platelet aggregation in the sample suggesting a possibly higher count.labs in the EMR available to [place] from February 2015 showed normal platelets at 163,000. Patient is also noted to have hepatitis.  I could not find hepatitis serologies in our EMR. There appears to be documentation of a history of hepatitis C, however primary care physician notes suggests possible hepatitis B.  Patient has elevated LFTs in the setting and has been consuming a fair amount of alcohol which she was counseled against strongly. Patient feels that she may have acquired her hepatitis through blood transfusions in the 1980s and vehemently denies any IV drug use.  She denies recent bruising/bleeding, such as spontaneous epistaxis, hematuria, melena or hematochezia. Denies any recent new medications, cardiac valve problems. No over-the-counter NSAIDs use. No fevers, chills, night sweats, unexplained weight loss, altered mental status, kidney failure, change in color of urine, jaundice. She denies prior platelet transfusions. She has no overt chemical exposures.  She had a CT abdomen and pelvis for lower abdominal pain on 11/28/2014 at [facility] showed marked  enlargement of the uterus with heavily calcified fibroids.  The uterus extends into the left side of the abdomen.  It measures 14.5 x 11.2 x 12.1 cm.  No evidence of overt hepatosplenomegaly. She notes that she was on iron pills in the distant past for iron deficiency but hasn't taken it for years.  Her blood pressure was elevated at 563 systolic.  Patient notes that she has not taken her blood pressure medications today and appears to have questionable compliance.  She notes she takes her metformin at times and has not really been taking the glipizide.  She was recommended discussing with her primary care physician possibly reconsidering the use of metformin if significant liver disease is suspected.  Patient appeared distracted during the entire medical interview process and was reading a magazine as she was being interviewed.  She seemed somewhat elusive with answering questions regarding alcohol use and other drug use.  She left the clinic without scheduling any followup appointments or having labs drawn as recommended.  She has previously been given an infectious disease referral by her primary care physician to have prior hepatitis evaluated and managed which she mentions she is not aware of.  She notes that she is on disability for chronic foot pain but is quite defensive about the specifics.  She was counseled that the use of narcotic pain medications in tandem with significant alcohol use can be life-threatening and was strongly discouraged.   MEDICAL HISTORY:  Past Medical History  Diagnosis Date  . Hypertension   . Hepatitis     hep C  . Arthritis   . Diabetes mellitus without complication   . Thrombocytopenia 12/25/2014  . GERD (gastroesophageal reflux disease)   .  Fibroid, uterine   . Urinary incontinence     Patient noticed mild and is not currently a significant problem    SURGICAL HISTORY: Past Surgical History  Procedure Laterality Date  . Broken ankle    . Multiple  extractions with alveoloplasty  10/25/2011    Procedure: MULTIPLE EXTRACION WITH ALVEOLOPLASTY;  Surgeon: Gae Bon, DDS;  Location: [facility];  Service: Oral Surgery;  Laterality: Bilateral;  Extract teeth numbers one, two, six, seven, eight, nine, ten, eleven, twelve, fifteen, sixteen, eighteen, nineteen, twenty-three, twenty-four, twenty-five, twenty-seven, thirty-two and alveoplasty.  . Ankle surgery      SOCIAL HISTORY: History   Social History  . Marital Status: Single    Spouse Name: N/A  . Number of Children: N/A  . Years of Education: N/A   Occupational History  . Not on file.   Social History Main Topics  . Smoking status: Never Smoker   . Smokeless tobacco: Not on file  . Alcohol Use: 0.6 oz/week    1 Cans of beer per week     Comment: 1/2 bottle of wine and some beer a day  . Drug Use: No  . Sexual Activity: Not Currently   Other Topics Concern  . Not on file   Social History Narrative    FAMILY HISTORY: Family History  Problem Relation Age of Onset  . Diabetes type II Mother   . Hypertension Mother     ALLERGIES:  has No Known Allergies.  MEDICATIONS:  Current Outpatient Prescriptions  Medication Sig Dispense Refill  . ACCU-CHEK AVIVA PLUS test strip USE TO CHECK BLOOD SUGAR TWICE A DAY  3  . amLODipine (NORVASC) 5 MG tablet Take 1 tablet (5 mg total) by mouth daily. 30 tablet 0  . folic acid (FOLVITE) 1 MG tablet Take 1 tablet (1 mg total) by mouth daily. 30 tablet 0  . glucose monitoring kit (FREESTYLE) monitoring kit 1 each by Does not apply route 4 (four) times daily - after meals and at bedtime. 1 month Diabetic Testing Supplies for QAC-QHS accuchecks. Any brand OK 1 each 1  . metFORMIN (GLUCOPHAGE) 500 MG tablet Take 500 mg by mouth daily with breakfast.     . thiamine (VITAMIN B-1) 100 MG tablet Take 1 tablet (100 mg total) by mouth daily. 30 tablet 0  . glipiZIDE (GLUCOTROL) 2.5 mg TABS tablet Take 1 tablet (5 mg total) by mouth daily before  breakfast. (Patient not taking: Reported on 12/25/2014) 30 tablet 0  . glipiZIDE (GLUCOTROL) 2.5 mg TABS tablet Take 0.5 tablets (2.5 mg total) by mouth daily before breakfast. (Patient not taking: Reported on 12/25/2014) 30 tablet 0   No current facility-administered medications for this visit.    REVIEW OF SYSTEMS:   Constitutional: Denies fevers, chills or abnormal night sweats Eyes: Denies blurriness of vision, double vision or watery eyes Ears, nose, mouth, throat, and face: Denies mucositis or sore throat Respiratory: Denies cough, dyspnea or wheezes Cardiovascular: Denies palpitation, chest discomfort or lower extremity swelling Gastrointestinal:  Denies nausea, heartburn or change in bowel habits Skin: Denies abnormal skin rashes Lymphatics: Denies new lymphadenopathy or easy bruising Neurological:Denies numbness, tingling or new weaknesses Behavioral/Psych: Mood is stable, no new changes  All other systems were reviewed with the patient and are negative.  PHYSICAL EXAMINATION: ECOG PERFORMANCE STATUS: 1 - Symptomatic but completely ambulatory  Filed Vitals:   12/25/14 0939  BP: 184/73  Pulse: 79  Temp: 98.6 F (37 C)  Resp: 18   Filed  Weights   12/25/14 0939  Weight: 159 lb 14.4 oz (72.53 kg)    GENERAL:alert, no distress and comfortable SKIN: skin color, texture, turgor are normal, no rashes or significant lesions EYES: normal, conjunctiva are pink and non-injected, sclera clear OROPHARYNX:no exudate, no erythema and lips, buccal mucosa, and tongue normal  NECK: supple, thyroid normal size, non-tender, without nodularity LYMPH:  no palpable lymphadenopathy in the cervical, axillary or inguinal LUNGS: clear to auscultation and percussion with normal breathing effort HEART: regular rate & rhythm and no murmurs and no lower extremity edema ABDOMEN:abdomen soft, non-tender and normal bowel sounds Musculoskeletal:no cyanosis of digits and no clubbing  PSYCH: alert &  oriented x 3 with fluent speech NEURO: no focal motor/sensory deficits  LABORATORY DATA:  I have reviewed the data Sent in by her primary care physician  CBC showed hemoglobin 11.3 MCV 76, WBC 4.7,platelet 90 K CMP sodium 138, potassium 4.2, creatinine 0.57, calcium 9.4, total protein 8.2, albumin 3.6, total bilirubin 1.6, alkaline phosphatase 152, AST 157, ALT 94.  No results found for this or any previous visit (from the past 2160 hour(s)).  RADIOGRAPHIC STUDIES: I have personally reviewed the CT scan report from outside hospital dated 11/28/2014 Which showed marked enlargement of the uterus with heavily calcified fibroids.  The uterus extends into the left side of the abdomen.  It measures 14.5 x 11.2 x 12.1 cm.  No evidence of overt hepatosplenomegaly.   ASSESSMENT & PLAN Thrombocytopenia Patient was noted to have mild to moderate thrombocytopenia on outside labs with some evidence of platelet clumping.  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  No clinical history suggestive of TTP/HUS Spectrum disorder. Patient has no overt issues with bleeding until her microcytic anemia might suggest chronic blood loss versus anemia of chronic disease. Plan -Patient was asked to schedule labs today to get a repeat CBC, CMP, hepatitis profile, HIV testing, peripheral blood smear, PT/PTT and LDH for further evaluation but chose not to have this done and left without scheduling any followup or doing the necessary labs for evaluation of her condition. -My nurses tried to contact her at home and it appears that she was disinterested in additional workup for followup -She was counseled about absolute alcohol  cessation in the setting of hepatitis, abnormal liver functions and concurrent opiate use. -We will need additional evaluation by a liver specialist or infectious disease for further definition of her infectious hepatitis and its treatment. -No overt evidence of a lymphoproliferative disorder at this time. -Continue followup with primary care physician and please reconsult [place] if patient so inclined.   Microcytic anemia Mild microcytic anemia with hemoglobin of 11.2 and MCV of 76.  This appears to be chronic.  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 Plan -Ordered ferritin level and iron studies which patient chose not to perform. -Would recommend getting GI workup with EGD and colonoscopy if ferritin levels were low and confirmed iron deficiency especially in the setting of liver disease.   Follow up with primary care physician for further cares and monitoring. Please reconsult [place] if patient is inclined to have the requisite workup and further evaluation.   Thank you for this interesting consultation.  Total  time spent 50 min. More than 50% time on direct patient face to face contact, counseling and coordination of care  Brunetta Genera MD 12/25/2014 Hematology oncology physician [facility]

## 2014-12-26 ENCOUNTER — Other Ambulatory Visit: Payer: Medicaid Other

## 2014-12-27 ENCOUNTER — Encounter: Payer: Self-pay | Admitting: Hematology

## 2014-12-27 DIAGNOSIS — D509 Iron deficiency anemia, unspecified: Secondary | ICD-10-CM | POA: Insufficient documentation

## 2014-12-27 NOTE — Assessment & Plan Note (Signed)
Patient was noted to have mild to moderate thrombocytopenia on outside labs with some evidence of platelet clumping.  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  No clinical history suggestive of TTP/HUS Spectrum disorder. Patient has no overt issues with bleeding until her microcytic anemia might suggest chronic blood loss versus anemia of chronic disease. Plan -Patient was asked to schedule labs today to get a repeat CBC, CMP, hepatitis profile, HIV testing, peripheral blood smear, PT/PTT and LDH for further evaluation but chose not to have this done and left without scheduling any followup or doing the necessary labs for evaluation of her condition. -My nurses tried to contact her at home and it appears that she was disinterested in additional workup for followup -She was counseled about absolute alcohol cessation in the setting of hepatitis, abnormal liver functions and concurrent opiate use. -We will need additional evaluation by a liver specialist or infectious disease for further definition of her infectious hepatitis and its treatment. -No overt evidence of a lymphoproliferative disorder at this time. -Continue followup with primary care physician and please reconsult [place] if patient so inclined.

## 2014-12-27 NOTE — Assessment & Plan Note (Signed)
Mild microcytic anemia with hemoglobin of 11.2 and MCV of 76.  This appears to be chronic.  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 Plan -Ordered ferritin level and iron studies which patient chose not to perform. -Would recommend getting GI workup with EGD and colonoscopy if ferritin levels were low and confirmed iron deficiency especially in the setting of liver disease.

## 2014-12-31 ENCOUNTER — Ambulatory Visit (HOSPITAL_BASED_OUTPATIENT_CLINIC_OR_DEPARTMENT_OTHER): Payer: Medicaid Other

## 2014-12-31 DIAGNOSIS — D696 Thrombocytopenia, unspecified: Secondary | ICD-10-CM | POA: Diagnosis not present

## 2014-12-31 DIAGNOSIS — D509 Iron deficiency anemia, unspecified: Secondary | ICD-10-CM | POA: Diagnosis not present

## 2014-12-31 LAB — COMPREHENSIVE METABOLIC PANEL (CC13)
ALT: 115 U/L — ABNORMAL HIGH (ref 0–55)
AST: 170 U/L — ABNORMAL HIGH (ref 5–34)
Albumin: 3.2 g/dL — ABNORMAL LOW (ref 3.5–5.0)
Alkaline Phosphatase: 167 U/L — ABNORMAL HIGH (ref 40–150)
Anion Gap: 6 mEq/L (ref 3–11)
BUN: 13.3 mg/dL (ref 7.0–26.0)
CO2: 24 mEq/L (ref 22–29)
Calcium: 9.4 mg/dL (ref 8.4–10.4)
Chloride: 106 mEq/L (ref 98–109)
Creatinine: 0.8 mg/dL (ref 0.6–1.1)
EGFR: 87 mL/min/{1.73_m2} — ABNORMAL LOW (ref >90)
Glucose: 238 mg/dl — ABNORMAL HIGH (ref 70–140)
Potassium: 3.9 mEq/L (ref 3.5–5.1)
Sodium: 136 mEq/L (ref 136–145)
Total Bilirubin: 1.25 mg/dL — ABNORMAL HIGH (ref 0.20–1.20)
Total Protein: 8.5 g/dL — ABNORMAL HIGH (ref 6.4–8.3)

## 2014-12-31 LAB — IRON AND TIBC CHCC
%SAT: 33 % (ref 21–57)
Iron: 111 ug/dL (ref 41–142)
TIBC: 340 ug/dL (ref 236–444)
UIBC: 230 ug/dL (ref 120–384)

## 2014-12-31 LAB — CBC & DIFF AND RETIC
BASO%: 0.2 % (ref 0.0–2.0)
Basophils Absolute: 0 10*3/uL (ref 0.0–0.1)
EOS%: 0 % (ref 0.0–7.0)
Eosinophils Absolute: 0 10*3/uL (ref 0.0–0.5)
HCT: 34 % — ABNORMAL LOW (ref 34.8–46.6)
HGB: 11.2 g/dL — ABNORMAL LOW (ref 11.6–15.9)
Immature Retic Fract: 7.4 % (ref 1.60–10.00)
LYMPH%: 30.6 % (ref 14.0–49.7)
MCH: 24.9 pg — ABNORMAL LOW (ref 25.1–34.0)
MCHC: 32.9 g/dL (ref 31.5–36.0)
MCV: 75.6 fL — ABNORMAL LOW (ref 79.5–101.0)
MONO#: 0.5 10*3/uL (ref 0.1–0.9)
MONO%: 12 % (ref 0.0–14.0)
NEUT#: 2.5 10*3/uL (ref 1.5–6.5)
NEUT%: 57.2 % (ref 38.4–76.8)
Platelets: 80 10*3/uL — ABNORMAL LOW (ref 145–400)
RBC: 4.5 10*6/uL (ref 3.70–5.45)
RDW: 15.2 % — ABNORMAL HIGH (ref 11.2–14.5)
Retic %: 3.01 % — ABNORMAL HIGH (ref 0.70–2.10)
Retic Ct Abs: 135.45 10*3/uL — ABNORMAL HIGH (ref 33.70–90.70)
WBC: 4.3 10*3/uL (ref 3.9–10.3)
lymph#: 1.3 10*3/uL (ref 0.9–3.3)
nRBC: 0 % (ref 0–0)

## 2014-12-31 LAB — LACTATE DEHYDROGENASE (CC13): LDH: 233 U/L (ref 125–245)

## 2014-12-31 LAB — CHCC SMEAR

## 2014-12-31 LAB — FERRITIN CHCC: Ferritin: 341 ng/ml — ABNORMAL HIGH (ref 9–269)

## 2015-01-03 ENCOUNTER — Telehealth: Payer: Self-pay

## 2015-01-03 LAB — PROTEIN ELECTROPHORESIS, SERUM
Albumin ELP: 3.6 g/dL — ABNORMAL LOW (ref 3.8–4.8)
Alpha-1-Globulin: 0.3 g/dL (ref 0.2–0.3)
Alpha-2-Globulin: 0.9 g/dL (ref 0.5–0.9)
Beta 2: 0.4 g/dL (ref 0.2–0.5)
Beta Globulin: 0.5 g/dL (ref 0.4–0.6)
Gamma Globulin: 2.5 g/dL — ABNORMAL HIGH (ref 0.8–1.7)
Total Protein, Serum Electrophoresis: 8.3 g/dL — ABNORMAL HIGH (ref 6.1–8.1)

## 2015-01-03 LAB — PROTHROMBIN TIME
INR: 1.16 (ref <1.50)
Prothrombin Time: 14.8 seconds (ref 11.6–15.2)

## 2015-01-03 LAB — HEPATITIS C ANTIBODY: HCV Ab: REACTIVE — AB

## 2015-01-03 LAB — HEPATITIS C RNA QUANTITATIVE
HCV Quantitative Log: 5.17 {Log} — ABNORMAL HIGH (ref <1.18)
HCV Quantitative: 148912 IU/mL — ABNORMAL HIGH (ref <15)

## 2015-01-03 LAB — HEPATITIS B CORE ANTIBODY, TOTAL: Hep B Core Total Ab: NONREACTIVE

## 2015-01-03 LAB — HIV ANTIBODY (ROUTINE TESTING W REFLEX): HIV 1&2 Ab, 4th Generation: NONREACTIVE

## 2015-01-03 LAB — APTT: aPTT: 33 seconds (ref 24–37)

## 2015-01-03 LAB — HEPATITIS B SURFACE ANTIGEN: Hepatitis B Surface Ag: NEGATIVE

## 2015-01-03 NOTE — Telephone Encounter (Signed)
Pt called requesting results of labs drawn on 7/26. Call transferred to Dr Grier Mitts RN

## 2015-01-03 NOTE — Telephone Encounter (Signed)
Patient called and notified of lab results. Instructed to keep appointments. Patient verbalized understanding.

## 2015-01-10 ENCOUNTER — Ambulatory Visit (INDEPENDENT_AMBULATORY_CARE_PROVIDER_SITE_OTHER): Payer: Medicaid Other | Admitting: Obstetrics & Gynecology

## 2015-01-10 ENCOUNTER — Encounter: Payer: Self-pay | Admitting: Obstetrics & Gynecology

## 2015-01-10 VITALS — BP 180/82 | HR 84 | Ht 65.0 in | Wt 154.7 lb

## 2015-01-10 DIAGNOSIS — D259 Leiomyoma of uterus, unspecified: Secondary | ICD-10-CM | POA: Diagnosis present

## 2015-01-10 DIAGNOSIS — R109 Unspecified abdominal pain: Secondary | ICD-10-CM

## 2015-01-10 NOTE — Progress Notes (Signed)
Patient ID: Deborah Wise, female   DOB: [birth year], 64 y.o.   MRN: [id number] History:  64 y.o. [id number] here today for eval of left side pain.  LMP >15 years prev.  No bleeding since that time.  Pt was told that she had fibroids in her 20's.  Pain has been present for 3-4 months.  She has been on pain meds for relief of the pain.  She has taken muscle relaxants which make her sleepy but does not completely relieve the pain.  The pain is worse with lying on that side and wearing tight clothes.  The pain is worse with bending.  All the pain is on the left side. Pt denies pelvic pain.  Pt denies h/o kidney problems.  She denies blood in the urine.      The following portions of the patient's history were reviewed and updated as appropriate: allergies, current medications, past family history, past medical history, past social history, past surgical history and problem list.  Review of Systems:  Pt reports last Pap 2 years prev whcih was normal.  Objective:  Physical Exam Blood pressure 180/82, pulse 84, height 5\' 5"  (1.651 m), weight 154 lb 11.2 oz (70.171 kg). Gen: NAD Abd: Soft, nontender and nondistended. Well healed vertical incision. Back: there is flank and side pain on the left side. This is  Pelvic: Normal appearing external genitalia; normal appearing vaginal mucosa and cervix.  Normal discharge.  Small uterus, no other palpable masses, no uterine or adnexal tenderness  Labs and Imaging 12/2014 from [facility] CT of pelvis show multifibroid uterus  14.5 x 11.2 x 12.1cm uterus kidneys reportedly normal  Assessment & Plan:  Left flank pain referre0d for eval to see if it related to fibroids.  Pt with long stadnign h/o fibroids that do not appear to be symptomatic.  It is possible that they are causing hydronephrosis however they do not appear to be large enough to do this.  Will get a renal sono to check for hydronephrosis.  If this is not present would look for other etiologies for the pts  left flank pain  Obtain pelvic sono Obtain renal sono F/u in 3 months or sooner prn  Will send letter back to primary care ofc

## 2015-01-10 NOTE — Patient Instructions (Signed)

## 2015-01-24 ENCOUNTER — Other Ambulatory Visit: Payer: Self-pay | Admitting: Obstetrics & Gynecology

## 2015-01-24 ENCOUNTER — Ambulatory Visit (HOSPITAL_COMMUNITY)
Admission: RE | Admit: 2015-01-24 | Discharge: 2015-01-24 | Disposition: A | Discharge status: Home / Self Care | Payer: Medicaid Other | Source: Ambulatory Visit | Attending: Obstetrics & Gynecology | Admitting: Obstetrics & Gynecology

## 2015-01-24 DIAGNOSIS — R109 Unspecified abdominal pain: Secondary | ICD-10-CM | POA: Diagnosis present

## 2015-01-24 DIAGNOSIS — D259 Leiomyoma of uterus, unspecified: Secondary | ICD-10-CM | POA: Diagnosis present

## 2015-01-30 ENCOUNTER — Telehealth: Payer: Self-pay | Admitting: *Deleted

## 2015-01-30 NOTE — Telephone Encounter (Signed)
FORWARD CALL

## 2015-01-31 ENCOUNTER — Telehealth: Payer: Self-pay | Admitting: General Practice

## 2015-01-31 NOTE — Telephone Encounter (Signed)
Per Dr Ihor Dow, patient's ultrasounds were normal- just show fibroids, which are not new. Patient should f/u with PCP. Called patient and a woman answered stating Deborah Wise wasn't in but would tell her we called.

## 2015-02-04 NOTE — Telephone Encounter (Signed)
Called patient and informed her of results and recommendations. Patient verbalized understanding and states her PCP still hasn't received her ultrasound results. Told her she will need to sign a release at her PCP allowing [place] to fax them her results. Patient verbalized understanding and had no questions

## 2015-02-06 ENCOUNTER — Telehealth: Payer: Self-pay | Admitting: Hematology

## 2015-02-06 DIAGNOSIS — B182 Chronic viral hepatitis C: Secondary | ICD-10-CM

## 2015-02-06 NOTE — Telephone Encounter (Signed)
Patient had called regarding the results of her ultrasound. It appears that she had an ultrasound of her kidneys and a transvaginal and pelvic ultrasound ordered by her OB/GYN doctor Dr. Purvis Kilts. I doctor the patient regarding the results as found in the electronic medical record and recommended she call her OB/GYN doctor for further follow-up regarding her pelvic pain. She is due to get an ultrasound of her abdomen to look at her liver and spleen and was educated to keep that appointment. We will see her back as per her appointment on 02/27/2015 to follow-up on her thrombocytopenia.  Patient will need a referral to the hepatitis C clinic which I will place in the computer system and let her primary care physician follow-up on.  Sullivan Lone MD [place] AAHIVMS [facility] Hematology/Oncology Physician [place]  (Office):       [phone] (Work cell):  [phone] (Fax):           [phone]

## 2015-02-12 ENCOUNTER — Inpatient Hospital Stay (HOSPITAL_COMMUNITY): Payer: Medicaid Other

## 2015-02-12 ENCOUNTER — Inpatient Hospital Stay (HOSPITAL_COMMUNITY)
Admission: AD | Admit: 2015-02-12 | Discharge: 2015-02-12 | Disposition: A | Discharge status: Home / Self Care | Payer: Medicaid Other | Source: Ambulatory Visit | Attending: Emergency Medicine | Admitting: Emergency Medicine

## 2015-02-12 ENCOUNTER — Encounter (HOSPITAL_COMMUNITY): Payer: Self-pay | Admitting: *Deleted

## 2015-02-12 DIAGNOSIS — E119 Type 2 diabetes mellitus without complications: Secondary | ICD-10-CM | POA: Insufficient documentation

## 2015-02-12 DIAGNOSIS — I10 Essential (primary) hypertension: Secondary | ICD-10-CM | POA: Insufficient documentation

## 2015-02-12 DIAGNOSIS — K219 Gastro-esophageal reflux disease without esophagitis: Secondary | ICD-10-CM | POA: Diagnosis not present

## 2015-02-12 DIAGNOSIS — Z8619 Personal history of other infectious and parasitic diseases: Secondary | ICD-10-CM | POA: Diagnosis not present

## 2015-02-12 DIAGNOSIS — R109 Unspecified abdominal pain: Secondary | ICD-10-CM | POA: Diagnosis not present

## 2015-02-12 DIAGNOSIS — M5432 Sciatica, left side: Secondary | ICD-10-CM

## 2015-02-12 LAB — URINALYSIS, ROUTINE W REFLEX MICROSCOPIC
Bilirubin Urine: NEGATIVE
Glucose, UA: NEGATIVE mg/dL
Hgb urine dipstick: NEGATIVE
Ketones, ur: 15 mg/dL — AB
Leukocytes, UA: NEGATIVE
Nitrite: NEGATIVE
Protein, ur: NEGATIVE mg/dL
Specific Gravity, Urine: 1.025 (ref 1.005–1.030)
Urobilinogen, UA: >8 mg/dL — ABNORMAL HIGH (ref 0.0–1.0)
pH: 6 (ref 5.0–8.0)

## 2015-02-12 LAB — CBC WITH DIFFERENTIAL/PLATELET
Basophils Absolute: 0 10*3/uL (ref 0.0–0.1)
Basophils Relative: 0 % (ref 0–1)
Eosinophils Absolute: 0 10*3/uL (ref 0.0–0.7)
Eosinophils Relative: 0 % (ref 0–5)
HCT: 35.6 % — ABNORMAL LOW (ref 36.0–46.0)
Hemoglobin: 11.8 g/dL — ABNORMAL LOW (ref 12.0–15.0)
Lymphocytes Relative: 51 % — ABNORMAL HIGH (ref 12–46)
Lymphs Abs: 2.4 10*3/uL (ref 0.7–4.0)
MCH: 24.7 pg — ABNORMAL LOW (ref 26.0–34.0)
MCHC: 33.1 g/dL (ref 30.0–36.0)
MCV: 74.5 fL — ABNORMAL LOW (ref 78.0–100.0)
Monocytes Absolute: 0.6 10*3/uL (ref 0.1–1.0)
Monocytes Relative: 12 % (ref 3–12)
Neutro Abs: 1.8 10*3/uL (ref 1.7–7.7)
Neutrophils Relative %: 37 % — ABNORMAL LOW (ref 43–77)
Platelets: 89 10*3/uL — ABNORMAL LOW (ref 150–400)
RBC: 4.78 MIL/uL (ref 3.87–5.11)
RDW: 15 % (ref 11.5–15.5)
WBC: 4.8 10*3/uL (ref 4.0–10.5)

## 2015-02-12 LAB — COMPREHENSIVE METABOLIC PANEL
ALT: 122 U/L — ABNORMAL HIGH (ref 14–54)
AST: 229 U/L — ABNORMAL HIGH (ref 15–41)
Albumin: 3.3 g/dL — ABNORMAL LOW (ref 3.5–5.0)
Alkaline Phosphatase: 101 U/L (ref 38–126)
Anion gap: 9 (ref 5–15)
BUN: 9 mg/dL (ref 6–20)
CO2: 27 mmol/L (ref 22–32)
Calcium: 9 mg/dL (ref 8.9–10.3)
Chloride: 100 mmol/L — ABNORMAL LOW (ref 101–111)
Creatinine, Ser: 0.7 mg/dL (ref 0.44–1.00)
GFR calc Af Amer: >60 mL/min (ref >60)
GFR calc non Af Amer: >60 mL/min (ref >60)
Glucose, Bld: 122 mg/dL — ABNORMAL HIGH (ref 65–99)
Potassium: 3.8 mmol/L (ref 3.5–5.1)
Sodium: 136 mmol/L (ref 135–145)
Total Bilirubin: 2.3 mg/dL — ABNORMAL HIGH (ref 0.3–1.2)
Total Protein: 8.7 g/dL — ABNORMAL HIGH (ref 6.5–8.1)

## 2015-02-12 LAB — GLUCOSE, CAPILLARY: Glucose-Capillary: 103 mg/dL — ABNORMAL HIGH (ref 65–99)

## 2015-02-12 MED ORDER — OXYCODONE HCL 5 MG PO TABS
5.0000 mg | ORAL_TABLET | Freq: Once | ORAL | Status: AC
  Administered 2015-02-12: 5 mg via ORAL
  Filled 2015-02-12: qty 1

## 2015-02-12 MED ORDER — AMLODIPINE BESYLATE 5 MG PO TABS
10.0000 mg | ORAL_TABLET | Freq: Every day | ORAL | Status: DC
  Administered 2015-02-12: 10 mg via ORAL
  Filled 2015-02-12 (×2): qty 2

## 2015-02-12 MED ORDER — HYDROMORPHONE HCL 1 MG/ML IJ SOLN
1.0000 mg | Freq: Once | INTRAMUSCULAR | Status: AC
Start: 2015-02-12 — End: 2015-02-12
  Administered 2015-02-12: 1 mg via INTRAVENOUS
  Filled 2015-02-12: qty 1

## 2015-02-12 MED ORDER — AMLODIPINE BESYLATE 10 MG PO TABS: 10.0000 mg | ORAL_TABLET | Freq: Every day | ORAL | Status: DC

## 2015-02-12 MED ORDER — IOHEXOL 300 MG/ML  SOLN
100.0000 mL | Freq: Once | INTRAMUSCULAR | Status: AC | PRN
  Administered 2015-02-12: 100 mL via INTRAVENOUS

## 2015-02-12 MED ORDER — OXYCODONE-ACETAMINOPHEN 5-325 MG PO TABS
2.0000 | ORAL_TABLET | Freq: Once | ORAL | Status: AC
  Administered 2015-02-12: 2 via ORAL
  Filled 2015-02-12: qty 2

## 2015-02-12 MED ORDER — IOHEXOL 300 MG/ML  SOLN: 25.0000 mL | Freq: Once | INTRAMUSCULAR | Status: DC | PRN

## 2015-02-12 MED ORDER — ONDANSETRON HCL 4 MG/2ML IJ SOLN
4.0000 mg | Freq: Once | INTRAMUSCULAR | Status: AC
  Administered 2015-02-12: 4 mg via INTRAVENOUS
  Filled 2015-02-12: qty 2

## 2015-02-12 MED ORDER — OXYCODONE HCL 5 MG PO TABS: 5.0000 mg | ORAL_TABLET | ORAL | Status: DC | PRN

## 2015-02-12 NOTE — Progress Notes (Signed)
Patient noncompliant with medications. States "I was taking too much." Some meds like antihypertensive, states she takes every now and then.

## 2015-02-12 NOTE — Discharge Instructions (Signed)
IT IS VERY IMPORTANT THAT YOU FOLLOW UP WITH YOUR FAMILY DOCTOR FOR FOLLOW UP REGARDING YOUR CT SCAN AND LABS.    Sciatica Sciatica is pain, weakness, numbness, or tingling along the path of the sciatic nerve. The nerve starts in the lower back and runs down the back of each leg. The nerve controls the muscles in the lower leg and in the back of the knee, while also providing sensation to the back of the thigh, lower leg, and the sole of your foot. Sciatica is a symptom of another medical condition. For instance, nerve damage or certain conditions, such as a herniated disk or bone spur on the spine, pinch or put pressure on the sciatic nerve. This causes the pain, weakness, or other sensations normally associated with sciatica. Generally, sciatica only affects one side of the body. CAUSES   Herniated or slipped disc.  Degenerative disk disease.  A pain disorder involving the narrow muscle in the buttocks (piriformis syndrome).  Pelvic injury or fracture.  Pregnancy.  Tumor (rare). SYMPTOMS  Symptoms can vary from mild to very severe. The symptoms usually travel from the low back to the buttocks and down the back of the leg. Symptoms can include:  Mild tingling or dull aches in the lower back, leg, or hip.  Numbness in the back of the calf or sole of the foot.  Burning sensations in the lower back, leg, or hip.  Sharp pains in the lower back, leg, or hip.  Leg weakness.  Severe back pain inhibiting movement. These symptoms may get worse with coughing, sneezing, laughing, or prolonged sitting or standing. Also, being overweight may worsen symptoms. DIAGNOSIS  Your caregiver will perform a physical exam to look for common symptoms of sciatica. He or she may ask you to do certain movements or activities that would trigger sciatic nerve pain. Other tests may be performed to find the cause of the sciatica. These may include:  Blood tests.  X-rays.  Imaging tests, such as an MRI or CT  scan. TREATMENT  Treatment is directed at the cause of the sciatic pain. Sometimes, treatment is not necessary and the pain and discomfort goes away on its own. If treatment is needed, your caregiver may suggest:  Over-the-counter medicines to relieve pain.  Prescription medicines, such as anti-inflammatory medicine, muscle relaxants, or narcotics.  Applying heat or ice to the painful area.  Steroid injections to lessen pain, irritation, and inflammation around the nerve.  Reducing activity during periods of pain.  Exercising and stretching to strengthen your abdomen and improve flexibility of your spine. Your caregiver may suggest losing weight if the extra weight makes the back pain worse.  Physical therapy.  Surgery to eliminate what is pressing or pinching the nerve, such as a bone spur or part of a herniated disk. HOME CARE INSTRUCTIONS   Only take over-the-counter or prescription medicines for pain or discomfort as directed by your caregiver.  Apply ice to the affected area for 20 minutes, 3-4 times a day for the first 48-72 hours. Then try heat in the same way.  Exercise, stretch, or perform your usual activities if these do not aggravate your pain.  Attend physical therapy sessions as directed by your caregiver.  Keep all follow-up appointments as directed by your caregiver.  Do not wear high heels or shoes that do not provide proper support.  Check your mattress to see if it is too soft. A firm mattress may lessen your pain and discomfort. [place]  CARE IF:   You lose control of your bowel or bladder (incontinence).  You have increasing weakness in the lower back, pelvis, buttocks, or legs.  You have redness or swelling of your back.  You have a burning sensation when you urinate.  You have pain that gets worse when you lie down or awakens you at night.  Your pain is worse than you have experienced in the past.  Your pain is lasting longer than 4  weeks.  You are suddenly losing weight without reason. MAKE SURE YOU:  Understand these instructions.  Will watch your condition.  Will get help right away if you are not doing well or get worse. Document Released: 05/18/2001 Document Revised: 11/23/2011 Document Reviewed: 10/03/2011 [facility] Patient Information 2015 [place], [place]. This information is not intended to replace advice given to you by your health care provider. Make sure you discuss any questions you have with your health care provider.  Abdominal Pain, Women Abdominal (stomach, pelvic, or belly) pain can be caused by many things. It is important to tell your doctor:  The location of the pain.  Does it come and go or is it present all the time?  Are there things that start the pain (eating certain foods, exercise)?  Are there other symptoms associated with the pain (fever, nausea, vomiting, diarrhea)? All of this is helpful to know when trying to find the cause of the pain. CAUSES   Stomach: virus or bacteria infection, or ulcer.  Intestine: appendicitis (inflamed appendix), regional ileitis (Crohn's disease), ulcerative colitis (inflamed colon), irritable bowel syndrome, diverticulitis (inflamed diverticulum of the colon), or cancer of the stomach or intestine.  Gallbladder disease or stones in the gallbladder.  Kidney disease, kidney stones, or infection.  Pancreas infection or cancer.  Fibromyalgia (pain disorder).  Diseases of the female organs:  Uterus: fibroid (non-cancerous) tumors or infection.  Fallopian tubes: infection or tubal pregnancy.  Ovary: cysts or tumors.  Pelvic adhesions (scar tissue).  Endometriosis (uterus lining tissue growing in the pelvis and on the pelvic organs).  Pelvic congestion syndrome (female organs filling up with blood just before the menstrual period).  Pain with the menstrual period.  Pain with ovulation (producing an egg).  Pain with an IUD (intrauterine  device, birth control) in the uterus.  Cancer of the female organs.  Functional pain (pain not caused by a disease, may improve without treatment).  Psychological pain.  Depression. DIAGNOSIS  Your doctor will decide the seriousness of your pain by doing an examination.  Blood tests.  X-rays.  Ultrasound.  CT scan (computed tomography, special type of X-ray).  MRI (magnetic resonance imaging).  Cultures, for infection.  Barium enema (dye inserted in the large intestine, to better view it with X-rays).  Colonoscopy (looking in intestine with a lighted tube).  Laparoscopy (minor surgery, looking in abdomen with a lighted tube).  Major abdominal exploratory surgery (looking in abdomen with a large incision). TREATMENT  The treatment will depend on the cause of the pain.   Many cases can be observed and treated at home.  Over-the-counter medicines recommended by your caregiver.  Prescription medicine.  Antibiotics, for infection.  Birth control pills, for painful periods or for ovulation pain.  Hormone treatment, for endometriosis.  Nerve blocking injections.  Physical therapy.  Antidepressants.  Counseling with a psychologist or psychiatrist.  Minor or major surgery. HOME CARE INSTRUCTIONS   Do not take laxatives, unless directed by your caregiver.  Take over-the-counter pain medicine only if ordered by  your caregiver. Do not take aspirin because it can cause an upset stomach or bleeding.  Try a clear liquid diet (broth or water) as ordered by your caregiver. Slowly move to a bland diet, as tolerated, if the pain is related to the stomach or intestine.  Have a thermometer and take your temperature several times a day, and record it.  Bed rest and sleep, if it helps the pain.  Avoid sexual intercourse, if it causes pain.  Avoid stressful situations.  Keep your follow-up appointments and tests, as your caregiver orders.  If the pain does not go away  with medicine or surgery, you may try:  Acupuncture.  Relaxation exercises (yoga, meditation).  Group therapy.  Counseling. SEEK MEDICAL CARE IF:   You notice certain foods cause stomach pain.  Your home care treatment is not helping your pain.  You need stronger pain medicine.  You want your IUD removed.  You feel faint or lightheaded.  You develop nausea and vomiting.  You develop a rash.  You are having side effects or an allergy to your medicine. SEEK IMMEDIATE MEDICAL CARE IF:   Your pain does not go away or gets worse.  You have a fever.  Your pain is felt only in portions of the abdomen. The right side could possibly be appendicitis. The left lower portion of the abdomen could be colitis or diverticulitis.  You are passing blood in your stools (bright red or black tarry stools, with or without vomiting).  You have blood in your urine.  You develop chills, with or without a fever.  You pass out. MAKE SURE YOU:   Understand these instructions.  Will watch your condition.  Will get help right away if you are not doing well or get worse. Document Released: 03/21/2007 Document Revised: 10/08/2013 Document Reviewed: 04/10/2009 [facility] Patient Information 2015 [place], [place]. This information is not intended to replace advice given to you by your health care provider. Make sure you discuss any questions you have with your health care provider.

## 2015-02-12 NOTE — ED Provider Notes (Signed)
CSN: [id number]     Arrival date & time 02/12/15  1325 History   First MD Initiated Contact with Patient 02/12/15 1715     Chief Complaint  Patient presents with  . Flank Pain  . Leg Pain  . Hypertension     (Consider location/radiation/quality/duration/timing/severity/associated sxs/prior Treatment) HPI  Deborah Wise is a 64 yo female with a PMH of HTN, DM, Hepatitis C, and GERD who presents with left lower back pain, left side pain and left leg pain. Her pain started on Sunday. She describes her pain as an achy/ throbbing. No position change seems to make it better or worse. She has tried Aleve, which does not help. She does not report a fall or trauma. She denies fever, chills, night sweats, N/V/D, CP, SOB. She was first seen at [facility] hospital who then transferred her to [facility] ED.   She states she has not taken her amlodipine since Sunday.   Past Medical History  Diagnosis Date  . Hypertension   . Hepatitis     hep C  . Arthritis   . Diabetes mellitus without complication   . Thrombocytopenia 12/25/2014  . GERD (gastroesophageal reflux disease)   . Fibroid, uterine   . Urinary incontinence     Patient noticed mild and is not currently a significant problem   Past Surgical History  Procedure Laterality Date  . Broken ankle    . Multiple extractions with alveoloplasty  10/25/2011    Procedure: MULTIPLE EXTRACION WITH ALVEOLOPLASTY;  Surgeon: Gae Bon, DDS;  Location: [place];  Service: Oral Surgery;  Laterality: Bilateral;  Extract teeth numbers one, two, six, seven, eight, nine, ten, eleven, twelve, fifteen, sixteen, eighteen, nineteen, twenty-three, twenty-four, twenty-five, twenty-seven, thirty-two and alveoplasty.  . Ankle surgery     Family History  Problem Relation Age of Onset  . Diabetes type II Mother   . Hypertension Mother   . Cancer Father     patient does not know what type of cancer   Social History  Substance Use Topics  . Smoking status: Never  Smoker   . Smokeless tobacco: None  . Alcohol Use: 0.6 oz/week    1 Cans of beer per week     Comment: 1/2 bottle of wine and some beer a day   OB History    Gravida Para Term Preterm AB TAB SAB Ectopic Multiple Living   5 3 3  2 1 1   3      Review of Systems  Constitutional: Negative for fever, chills and fatigue.  Respiratory: Negative for cough and shortness of breath.   Cardiovascular: Negative for chest pain.  Gastrointestinal: Negative for abdominal distention.  Genitourinary: Positive for flank pain.      Allergies  Review of patient's allergies indicates no known allergies.  Home Medications   Prior to Admission medications   Medication Sig Start Date End Date Taking? Authorizing Provider  amLODipine (NORVASC) 5 MG tablet Take 1 tablet (5 mg total) by mouth daily. 07/22/13  Yes Thurnell Lose, MD  metFORMIN (GLUCOPHAGE) 500 MG tablet Take 500 mg by mouth daily with breakfast.    Yes Historical Provider, MD  thiamine (VITAMIN B-1) 100 MG tablet Take 1 tablet (100 mg total) by mouth daily. 07/22/13  Yes Thurnell Lose, MD  ACCU-CHEK AVIVA PLUS test strip USE TO CHECK BLOOD SUGAR TWICE A DAY 12/06/14   Historical Provider, MD  glucose monitoring kit (FREESTYLE) monitoring kit 1 each by Does not apply route 4 (  four) times daily - after meals and at bedtime. 1 month Diabetic Testing Supplies for QAC-QHS accuchecks. Any brand OK 07/22/13   Thurnell Lose, MD   BP 230/91 mmHg  Pulse 80  Temp(Src) 98.2 F (36.8 C) (Oral)  Resp 19  Ht 5' 5"  (1.651 m)  Wt 157 lb (71.215 kg)  BMI 26.13 kg/m2  SpO2 100% Physical Exam  Constitutional: She is oriented to person, place, and time. She appears well-developed and well-nourished. No distress.  HENT:  Head: Normocephalic and atraumatic.  Eyes: EOM are normal.  Neck: Normal range of motion. Neck supple.  Cardiovascular: Normal rate, regular rhythm and normal heart sounds.  Exam reveals no gallop and no friction rub.   No murmur  heard. Pulmonary/Chest: Effort normal and breath sounds normal. No respiratory distress.  Abdominal: Soft. Bowel sounds are normal. She exhibits no distension. There is tenderness.  Musculoskeletal: She exhibits tenderness. She exhibits no edema.  Tenderness to palpation of left lower back and left lower side  Negative SLR on left   Neurological: She is alert and oriented to person, place, and time.  Reflex Scores:      Patellar reflexes are 2+ on the left side. Skin: Skin is warm and dry. No erythema.  Psychiatric: She has a normal mood and affect. Her behavior is normal.    ED Course  Procedures (including critical care time) Labs Review Labs Reviewed  URINALYSIS, ROUTINE W REFLEX MICROSCOPIC (NOT AT [facility]) - Abnormal; Notable for the following:    Ketones, ur 15 (*)    Urobilinogen, UA >8.0 (*)    All other components within normal limits  GLUCOSE, CAPILLARY - Abnormal; Notable for the following:    Glucose-Capillary 103 (*)    All other components within normal limits    Imaging Review Dg Lumbar Spine Complete  02/12/2015   CLINICAL DATA:  Low back pain for 4 days  EXAM: LUMBAR SPINE - COMPLETE 4+ VIEW  COMPARISON:  Pelvic ultrasound 01/24/2015  FINDINGS: There is no evidence of lumbar spine fracture. Alignment is normal. Intervertebral disc spaces are maintained. Coarse lobulated calcifications over the pelvis likely indicate fibroids. Leiomyosarcoma is not excluded. Atheromatous aortic calcification without calcified aneurysm. Multilevel mild disc degenerative change with anterior spurring. Mild disc space loss at L4-L5.  IMPRESSION: No acute osseous abnormality.   Electronically Signed   By: Conchita Paris M.D.   On: 02/12/2015 18:38   I have personally reviewed and evaluated these images and lab results as part of my medical decision-making.   EKG Interpretation None      MDM   Final diagnoses:  Left flank pain  Sciatica, left  Essential hypertension    patient  with left lower back pain radiating into the leg. Some pain into the left abdomen. Patient states symptoms started 3 days ago, however her [facility], patient was seen for similar pain a month ago. Pelvic ultrasound at that time was negative. Patient is hypertensive emergency department, blood pressure is low 200s over 90s. Will get labs, lumbar films, pain management.  8:02 PM Discussed with Dr. Katina Degree, will get CT abdomen with contrast to rule out aneurysm as the cause of the pain. PB down to 389H systolic.   Filed Vitals:   02/12/15 1800 02/12/15 1815 02/12/15 1845 02/12/15 1900  BP: 231/80 206/84 212/69 217/75  Pulse: 77 83 72 76  Temp:      TempSrc:      Resp: 13 22    Height:  Weight:      SpO2: 100% 97% 93% 92%     Jeannett Senior, PA-C 02/12/15 2004  [address] PA-C 02/12/15 2033  Quintella Reichert, MD 02/13/15 [phone]

## 2015-02-12 NOTE — Progress Notes (Signed)
Patient transferred to [facility] via [facility] for further evaluation. Condition stable. Patient continues to deny chest pain, pain in arms, tingling, HA, blurry vision, dizziness. Still C/O pain in L flank and down L leg, relieved somewhat by pain med.

## 2015-02-12 NOTE — ED Provider Notes (Signed)
8:12 PM Patient signed out to me by Jeannett Senior, PA-C. Patient pending CT abdomen pelvis with contrast. If discharged patient will need Norvasc 10mg  prescription.   CT unremarkable. Patient will be treated for sciatica.   Results for orders placed or performed during the hospital encounter of 02/12/15  Urinalysis, Routine w reflex microscopic (not at [facility])  Result Value Ref Range   Color, Urine YELLOW YELLOW   APPearance CLEAR CLEAR   Specific Gravity, Urine 1.025 1.005 - 1.030   pH 6.0 5.0 - 8.0   Glucose, UA NEGATIVE NEGATIVE mg/dL   Hgb urine dipstick NEGATIVE NEGATIVE   Bilirubin Urine NEGATIVE NEGATIVE   Ketones, ur 15 (A) NEGATIVE mg/dL   Protein, ur NEGATIVE NEGATIVE mg/dL   Urobilinogen, UA >8.0 (H) 0.0 - 1.0 mg/dL   Nitrite NEGATIVE NEGATIVE   Leukocytes, UA NEGATIVE NEGATIVE  Glucose, capillary  Result Value Ref Range   Glucose-Capillary 103 (H) 65 - 99 mg/dL  CBC with Differential  Result Value Ref Range   WBC 4.8 4.0 - 10.5 K/uL   RBC 4.78 3.87 - 5.11 MIL/uL   Hemoglobin 11.8 (L) 12.0 - 15.0 g/dL   HCT 35.6 (L) 36.0 - 46.0 %   MCV 74.5 (L) 78.0 - 100.0 fL   MCH 24.7 (L) 26.0 - 34.0 pg   MCHC 33.1 30.0 - 36.0 g/dL   RDW 15.0 11.5 - 15.5 %   Platelets 89 (L) 150 - 400 K/uL   Neutrophils Relative % 37 (L) 43 - 77 %   Lymphocytes Relative 51 (H) 12 - 46 %   Monocytes Relative 12 3 - 12 %   Eosinophils Relative 0 0 - 5 %   Basophils Relative 0 0 - 1 %   Neutro Abs 1.8 1.7 - 7.7 K/uL   Lymphs Abs 2.4 0.7 - 4.0 K/uL   Monocytes Absolute 0.6 0.1 - 1.0 K/uL   Eosinophils Absolute 0.0 0.0 - 0.7 K/uL   Basophils Absolute 0.0 0.0 - 0.1 K/uL   RBC Morphology TARGET CELLS    Smear Review LARGE PLATELETS PRESENT   Comprehensive metabolic panel  Result Value Ref Range   Sodium 136 135 - 145 mmol/L   Potassium 3.8 3.5 - 5.1 mmol/L   Chloride 100 (L) 101 - 111 mmol/L   CO2 27 22 - 32 mmol/L   Glucose, Bld 122 (H) 65 - 99 mg/dL   BUN 9 6 - 20 mg/dL   Creatinine,  Ser 0.70 0.44 - 1.00 mg/dL   Calcium 9.0 8.9 - 10.3 mg/dL   Total Protein 8.7 (H) 6.5 - 8.1 g/dL   Albumin 3.3 (L) 3.5 - 5.0 g/dL   AST 229 (H) 15 - 41 U/L   ALT 122 (H) 14 - 54 U/L   Alkaline Phosphatase 101 38 - 126 U/L   Total Bilirubin 2.3 (H) 0.3 - 1.2 mg/dL   GFR calc non Af Amer >60 >60 mL/min   GFR calc Af Amer >60 >60 mL/min   Anion gap 9 5 - 15   Dg Lumbar Spine Complete  02/12/2015   CLINICAL DATA:  Low back pain for 4 days  EXAM: LUMBAR SPINE - COMPLETE 4+ VIEW  COMPARISON:  Pelvic ultrasound 01/24/2015  FINDINGS: There is no evidence of lumbar spine fracture. Alignment is normal. Intervertebral disc spaces are maintained. Coarse lobulated calcifications over the pelvis likely indicate fibroids. Leiomyosarcoma is not excluded. Atheromatous aortic calcification without calcified aneurysm. Multilevel mild disc degenerative change with anterior spurring. Mild disc space  loss at L4-L5.  IMPRESSION: No acute osseous abnormality.   Electronically Signed   By: Conchita Paris M.D.   On: 02/12/2015 18:38   US Transvaginal Non-ob  01/24/2015   CLINICAL DATA:  Left flank pain since April 2016  EXAM: TRANSABDOMINAL AND TRANSVAGINAL ULTRASOUND OF PELVIS  TECHNIQUE: Both transabdominal and transvaginal ultrasound examinations of the pelvis were performed. Transabdominal technique was performed for global imaging of the pelvis including uterus, ovaries, adnexal regions, and pelvic cul-de-sac. It was necessary to proceed with endovaginal exam following the transabdominal exam to visualize the right ovary and left adnexa.  COMPARISON:  None  FINDINGS: Uterus  Uterus is poorly evaluated on ultrasound due to multiple calcified uterine fibroids which shadow and distort the uterine anatomy. Gross uterine measurements are 12.7 x 11.0 x 9.9 cm transabdominally. Transvaginal measurements could not be provided. Discrete fibroid measurements could not be provided.  Endometrium  Not discretely visualized.  Right  ovary  Measurements: 3.0 x 1.2 x 2.4 cm. 5 mm dystrophic calcification.  Left ovary  Not discretely visualized.  Other findings  No free fluid.  IMPRESSION: Uterus is poorly evaluated on ultrasound due to multiple shadowing calcified uterine fibroids.  Endometrium and left ovary are not discretely visualized.  Right ovary is within normal limits.   Electronically Signed   By: Julian Hy M.D.   On: 01/24/2015 09:27   US Pelvis Complete  01/24/2015   CLINICAL DATA:  Left flank pain since April 2016  EXAM: TRANSABDOMINAL AND TRANSVAGINAL ULTRASOUND OF PELVIS  TECHNIQUE: Both transabdominal and transvaginal ultrasound examinations of the pelvis were performed. Transabdominal technique was performed for global imaging of the pelvis including uterus, ovaries, adnexal regions, and pelvic cul-de-sac. It was necessary to proceed with endovaginal exam following the transabdominal exam to visualize the right ovary and left adnexa.  COMPARISON:  None  FINDINGS: Uterus  Uterus is poorly evaluated on ultrasound due to multiple calcified uterine fibroids which shadow and distort the uterine anatomy. Gross uterine measurements are 12.7 x 11.0 x 9.9 cm transabdominally. Transvaginal measurements could not be provided. Discrete fibroid measurements could not be provided.  Endometrium  Not discretely visualized.  Right ovary  Measurements: 3.0 x 1.2 x 2.4 cm. 5 mm dystrophic calcification.  Left ovary  Not discretely visualized.  Other findings  No free fluid.  IMPRESSION: Uterus is poorly evaluated on ultrasound due to multiple shadowing calcified uterine fibroids.  Endometrium and left ovary are not discretely visualized.  Right ovary is within normal limits.   Electronically Signed   By: Julian Hy M.D.   On: 01/24/2015 09:27   Ct Abdomen Pelvis W Contrast  02/12/2015   CLINICAL DATA:  Left flank pain radiating into the left lower quadrant for 4 days. No known injury. Initial encounter.  EXAM: CT ABDOMEN AND  PELVIS WITH CONTRAST  TECHNIQUE: Multidetector CT imaging of the abdomen and pelvis was performed using the standard protocol following bolus administration of intravenous contrast.  CONTRAST:  100 mL OMNIPAQUE IOHEXOL 300 MG/ML  SOLN  COMPARISON:  None.  FINDINGS: There is no pleural or pericardial effusion. Heart size is normal. Mild dependent atelectasis is seen in the lung bases.  The liver is low attenuating consistent with fatty infiltration. No focal liver lesion is identified but there is subtle nodularity of the border of the liver along the left hepatic lobe. The gallbladder, adrenal glands, pancreas and kidneys appear normal. Calcifications in the spleen are consistent with old granulomatous disease. The spleen is otherwise  unremarkable.  There is extensive aortoiliac atherosclerosis without aneurysm. The stomach and small and large bowel appear normal. The appendix is not visualized but no evidence inflammatory process is seen.  The patient has a very bulky fibroid uterus with multiple large calcified fibroids identified. The largest fibroid is in the fundus measuring approximately 8.2 cm in diameter. No lymphadenopathy or fluid is identified.  No lytic or sclerotic bony lesion is seen. Disc bulging is most notable at L3-4, L4-5 and L5-S1.  IMPRESSION: No acute finding abdomen or pelvis.  Very bulky uterus with multiple calcified fibroids identified.  Fatty infiltration of the liver with subtle nodularity of the liver border suggestive of cirrhosis.  Aortoiliac atherosclerosis without aneurysm.   Electronically Signed   By: Inge Rise M.D.   On: 02/12/2015 21:07   US Renal  01/24/2015   CLINICAL DATA:  Left flank pain for several months.  EXAM: RENAL / URINARY TRACT ULTRASOUND COMPLETE  COMPARISON:  Abdominal ultrasound 07/22/2013.  FINDINGS: Right Kidney:  Length: 10.2 cm, within normal limits. Echogenicity within normal limits. No mass or hydronephrosis visualized.  Left Kidney:  Length: 11.4  cm, within normal limits. Echogenicity within normal limits. No mass or hydronephrosis visualized.  Bladder:  Appears normal for degree of bladder distention.  IMPRESSION: Negative bilateral renal ultrasound.   Electronically Signed   By: San Morelle M.D.   On: 01/24/2015 09:05      Alvina Chou, PA-C 02/14/15 0051  Quintella Reichert, MD 02/14/15 [phone]

## 2015-02-12 NOTE — MAU Note (Addendum)
Pain started on Sunday, getting worse.  Started in left flank, goes down left leg, feels like leg is giving out.  Taking pain pills, no relief. Denies hx of kidney stones or urinary problems

## 2015-02-12 NOTE — ED Notes (Signed)
Patient transported to X-ray 

## 2015-02-12 NOTE — MAU Provider Note (Signed)
Chief Complaint: Flank Pain and Leg Pain   First Provider Initiated Contact with Patient 02/12/15 1411      SUBJECTIVE HPI: Deborah Wise is a 64 y.o. [id number] who presents to maternity admissions reporting left sided pain radiating down her left leg.  She saw Dr  Ihor Dow in the [facility] on 01/10/15 and had follow up pelvic and renal ultrasound on 01/24/15. Results were called to patient with report that she needs to follow up with primary care to determine cause of her recurrent pain.  She has uterine fibroids on [place] but no other abnormalities and this is unlikely cause of flank pain radiating into left leg.  The pt is prescribed blood pressure medication, but she is unsure which medications and she has only taken them occasionally in recent months.  She denies chest pain, dizziness, h/a, or tingling/numbness of extremities. She denies vaginal bleeding, vaginal itching/burning, urinary symptoms, n/v, or fever/chills.     Flank Pain This is a recurrent problem. The current episode started more than 1 month ago. The problem occurs intermittently. The problem is unchanged. The quality of the pain is described as stabbing and shooting. The pain radiates to the left thigh. The pain is severe. The symptoms are aggravated by position, standing and sitting. Pertinent negatives include no abdominal pain, chest pain, dysuria, fever, headaches, pelvic pain or weakness. She has tried muscle relaxant, heat and analgesics for the symptoms. The treatment provided mild relief.    Past Medical History  Diagnosis Date  . Hypertension   . Hepatitis     hep C  . Arthritis   . Diabetes mellitus without complication   . Thrombocytopenia 12/25/2014  . GERD (gastroesophageal reflux disease)   . Fibroid, uterine   . Urinary incontinence     Patient noticed mild and is not currently a significant problem   Past Surgical History  Procedure Laterality Date  . Broken ankle    . Multiple  extractions with alveoloplasty  10/25/2011    Procedure: MULTIPLE EXTRACION WITH ALVEOLOPLASTY;  Surgeon: Gae Bon, DDS;  Location: [facility];  Service: Oral Surgery;  Laterality: Bilateral;  Extract teeth numbers one, two, six, seven, eight, nine, ten, eleven, twelve, fifteen, sixteen, eighteen, nineteen, twenty-three, twenty-four, twenty-five, twenty-seven, thirty-two and alveoplasty.  . Ankle surgery     Social History   Social History  . Marital Status: Single    Spouse Name: N/A  . Number of Children: N/A  . Years of Education: N/A   Occupational History  . Not on file.   Social History Main Topics  . Smoking status: Never Smoker   . Smokeless tobacco: Not on file  . Alcohol Use: 0.6 oz/week    1 Cans of beer per week     Comment: 1/2 bottle of wine and some beer a day  . Drug Use: No  . Sexual Activity: Not Currently   Other Topics Concern  . Not on file   Social History Narrative   No current facility-administered medications on file prior to encounter.   Current Outpatient Prescriptions on File Prior to Encounter  Medication Sig Dispense Refill  . amLODipine (NORVASC) 5 MG tablet Take 1 tablet (5 mg total) by mouth daily. 30 tablet 0  . metFORMIN (GLUCOPHAGE) 500 MG tablet Take 500 mg by mouth daily with breakfast.     . thiamine (VITAMIN B-1) 100 MG tablet Take 1 tablet (100 mg total) by mouth daily. 30 tablet 0  . ACCU-CHEK AVIVA PLUS test  strip USE TO CHECK BLOOD SUGAR TWICE A DAY  3  . glucose monitoring kit (FREESTYLE) monitoring kit 1 each by Does not apply route 4 (four) times daily - after meals and at bedtime. 1 month Diabetic Testing Supplies for QAC-QHS accuchecks. Any brand OK 1 each 1   No Known Allergies  ROS:  Review of Systems  Constitutional: Negative for fever, chills and fatigue.  HENT: Negative for sinus pressure.   Eyes: Negative for photophobia.  Respiratory: Negative for shortness of breath.   Cardiovascular: Negative for chest pain.   Gastrointestinal: Negative for nausea, vomiting, abdominal pain, diarrhea and constipation.  Genitourinary: Positive for flank pain. Negative for dysuria, frequency, vaginal bleeding, vaginal discharge, difficulty urinating, vaginal pain and pelvic pain.  Musculoskeletal: Negative for neck pain.  Neurological: Negative for dizziness, weakness and headaches.  Psychiatric/Behavioral: Negative.      I have reviewed patient's Past Medical Hx, Surgical Hx, Family Hx, Social Hx, medications and allergies.   Physical Exam   Patient Vitals for the past 24 hrs:  BP Temp Temp src Pulse Resp  02/12/15 1540 (!) 248/93 mmHg - - 85 -  02/12/15 1353 (!) 210/86 mmHg - - 87 -  02/12/15 1339 199/85 mmHg 98 F (36.7 C) Oral 86 22   Constitutional: Well-developed, well-nourished female in mild distress.  Cardiovascular: normal rate Respiratory: normal effort GI: Abd soft, tenderness only in left mid/lower abdomen.  No rebound tenderness or guarding. Pos BS x 4 MS: Extremities nontender, no edema, normal ROM Neurological - alert, oriented, normal speech, no focal findings or movement disorder noted, neck supple without rigidity, cranial nerves II through XII intact, DTR's normal and symmetric, normal muscle tone, no tremors, strength 5/5 GU: Neg CVAT.  PELVIC EXAM: Deferred   LAB RESULTS Results for orders placed or performed during the hospital encounter of 02/12/15 (from the past 24 hour(s))  Urinalysis, Routine w reflex microscopic (not at [facility])     Status: Abnormal   Collection Time: 02/12/15  1:40 PM  Result Value Ref Range   Color, Urine YELLOW YELLOW   APPearance CLEAR CLEAR   Specific Gravity, Urine 1.025 1.005 - 1.030   pH 6.0 5.0 - 8.0   Glucose, UA NEGATIVE NEGATIVE mg/dL   Hgb urine dipstick NEGATIVE NEGATIVE   Bilirubin Urine NEGATIVE NEGATIVE   Ketones, ur 15 (A) NEGATIVE mg/dL   Protein, ur NEGATIVE NEGATIVE mg/dL   Urobilinogen, UA >8.0 (H) 0.0 - 1.0 mg/dL   Nitrite NEGATIVE  NEGATIVE   Leukocytes, UA NEGATIVE NEGATIVE       IMAGING US Transvaginal Non-ob  01/24/2015   CLINICAL DATA:  Left flank pain since April 2016  EXAM: TRANSABDOMINAL AND TRANSVAGINAL ULTRASOUND OF PELVIS  TECHNIQUE: Both transabdominal and transvaginal ultrasound examinations of the pelvis were performed. Transabdominal technique was performed for global imaging of the pelvis including uterus, ovaries, adnexal regions, and pelvic cul-de-sac. It was necessary to proceed with endovaginal exam following the transabdominal exam to visualize the right ovary and left adnexa.  COMPARISON:  None  FINDINGS: Uterus  Uterus is poorly evaluated on ultrasound due to multiple calcified uterine fibroids which shadow and distort the uterine anatomy. Gross uterine measurements are 12.7 x 11.0 x 9.9 cm transabdominally. Transvaginal measurements could not be provided. Discrete fibroid measurements could not be provided.  Endometrium  Not discretely visualized.  Right ovary  Measurements: 3.0 x 1.2 x 2.4 cm. 5 mm dystrophic calcification.  Left ovary  Not discretely visualized.  Other findings  No  free fluid.  IMPRESSION: Uterus is poorly evaluated on ultrasound due to multiple shadowing calcified uterine fibroids.  Endometrium and left ovary are not discretely visualized.  Right ovary is within normal limits.   Electronically Signed   By: Julian Hy M.D.   On: 01/24/2015 09:27   US Pelvis Complete  01/24/2015   CLINICAL DATA:  Left flank pain since April 2016  EXAM: TRANSABDOMINAL AND TRANSVAGINAL ULTRASOUND OF PELVIS  TECHNIQUE: Both transabdominal and transvaginal ultrasound examinations of the pelvis were performed. Transabdominal technique was performed for global imaging of the pelvis including uterus, ovaries, adnexal regions, and pelvic cul-de-sac. It was necessary to proceed with endovaginal exam following the transabdominal exam to visualize the right ovary and left adnexa.  COMPARISON:  None  FINDINGS:  Uterus  Uterus is poorly evaluated on ultrasound due to multiple calcified uterine fibroids which shadow and distort the uterine anatomy. Gross uterine measurements are 12.7 x 11.0 x 9.9 cm transabdominally. Transvaginal measurements could not be provided. Discrete fibroid measurements could not be provided.  Endometrium  Not discretely visualized.  Right ovary  Measurements: 3.0 x 1.2 x 2.4 cm. 5 mm dystrophic calcification.  Left ovary  Not discretely visualized.  Other findings  No free fluid.  IMPRESSION: Uterus is poorly evaluated on ultrasound due to multiple shadowing calcified uterine fibroids.  Endometrium and left ovary are not discretely visualized.  Right ovary is within normal limits.   Electronically Signed   By: Julian Hy M.D.   On: 01/24/2015 09:27   US Renal  01/24/2015   CLINICAL DATA:  Left flank pain for several months.  EXAM: RENAL / URINARY TRACT ULTRASOUND COMPLETE  COMPARISON:  Abdominal ultrasound 07/22/2013.  FINDINGS: Right Kidney:  Length: 10.2 cm, within normal limits. Echogenicity within normal limits. No mass or hydronephrosis visualized.  Left Kidney:  Length: 11.4 cm, within normal limits. Echogenicity within normal limits. No mass or hydronephrosis visualized.  Bladder:  Appears normal for degree of bladder distention.  IMPRESSION: Negative bilateral renal ultrasound.   Electronically Signed   By: San Morelle M.D.   On: 01/24/2015 09:05    MAU Management/MDM: Ordered labs and reviewed results.  Consult Dr Nehemiah Settle.  Treatments in MAU included Percocet 5/325 x 2 tabs. Pain improved slightly but blood pressure remains severely elevated.  Consult Dr Nehemiah Settle again.  Plan to transfer to [facility] ED for further eval of BP and pain.  ASSESSMENT 1. Left flank pain   2. Sciatica, left   3. Essential hypertension     PLAN Consult Dr Vanita Panda at [facility] who accepts transfer Transfer to [facility] ED for further evaluation of BP and pain [facility] contacted Discussed  transfer with pt and s/o, who state understanding Pt stable at time of transfer     Medication List    ASK your doctor about these medications        ACCU-CHEK AVIVA PLUS test strip  Generic drug:  glucose blood  USE TO CHECK BLOOD SUGAR TWICE A DAY     amLODipine 5 MG tablet  Commonly known as:  NORVASC  Take 1 tablet (5 mg total) by mouth daily.     glucose monitoring kit monitoring kit  1 each by Does not apply route 4 (four) times daily - after meals and at bedtime. 1 month Diabetic Testing Supplies for QAC-QHS accuchecks. Any brand OK     metFORMIN 500 MG tablet  Commonly known as:  GLUCOPHAGE  Take 500 mg by mouth daily with breakfast.  thiamine 100 MG tablet  Commonly known as:  VITAMIN B-1  Take 1 tablet (100 mg total) by mouth daily.       Follow-up Information    Please follow up.   Why:  With your primary care provider as soon as possible      Follow up with [facility].   Specialty:  Emergency Medicine   Why:  As needed for emergencies   Contact information:   [address] [id number] Nash Pickrell [place] Certified Nurse-Midwife 02/12/2015  4:07 PM

## 2015-02-19 ENCOUNTER — Encounter: Payer: Self-pay | Admitting: Obstetrics & Gynecology

## 2015-02-27 ENCOUNTER — Ambulatory Visit: Payer: Medicaid Other | Admitting: Hematology

## 2015-04-18 ENCOUNTER — Encounter: Payer: Self-pay | Admitting: *Deleted

## 2015-04-22 ENCOUNTER — Telehealth: Payer: Self-pay | Admitting: *Deleted

## 2015-04-22 NOTE — Telephone Encounter (Signed)
Pt left message stating that she was given a referral to a liver doctor but has lost the number. Please call back with that number so she can schedule her appointment. She can be reached at [phone] or [phone].

## 2015-04-24 NOTE — Telephone Encounter (Signed)
Deborah Wise called back. I informed her that I do not think we made a referral to a liver doctor and that she should call [facility] as they are her pcp per Medicaid and should be making her referrals. She states she called them and they did not know and also stated she had been given an appointment by someone , not sure who- but she lost it. States she has seen a lot of providers.  I informed her I could not see any appointments she was scheduled for a liver doctor and that I was sorry I could not help her since we are not her PCP. I encouraged her to call back to [facility] and they should be able to help her. She voices understanding.

## 2015-04-24 NOTE — Telephone Encounter (Signed)
Called Deborah Wise and heard a message the wireless customer is not available. Unable to leave a message. Per chart review Deborah Wise it appears she has [employer] and was referred to [place] by her PCP- [facility] for gyn issues- do not see that we referred her to a liver doctor. She should call her PCP for that referral information due to the [facility] access.

## 2015-05-16 ENCOUNTER — Ambulatory Visit: Payer: Medicaid Other | Admitting: Obstetrics & Gynecology

## 2015-06-08 HISTORY — PX: COLONOSCOPY: SHX174

## 2015-07-17 ENCOUNTER — Other Ambulatory Visit (HOSPITAL_COMMUNITY): Payer: Self-pay | Admitting: Physician Assistant

## 2015-07-17 DIAGNOSIS — B182 Chronic viral hepatitis C: Secondary | ICD-10-CM

## 2015-07-29 ENCOUNTER — Ambulatory Visit (HOSPITAL_COMMUNITY)
Admission: RE | Admit: 2015-07-29 | Discharge: 2015-07-29 | Disposition: A | Discharge status: Home / Self Care | Payer: Medicaid Other | Source: Ambulatory Visit | Attending: Physician Assistant | Admitting: Physician Assistant

## 2015-07-29 DIAGNOSIS — B182 Chronic viral hepatitis C: Secondary | ICD-10-CM | POA: Diagnosis present

## 2015-07-29 DIAGNOSIS — R932 Abnormal findings on diagnostic imaging of liver and biliary tract: Secondary | ICD-10-CM | POA: Diagnosis not present

## 2016-01-05 ENCOUNTER — Other Ambulatory Visit: Payer: Self-pay | Admitting: Nurse Practitioner

## 2016-01-05 DIAGNOSIS — B182 Chronic viral hepatitis C: Secondary | ICD-10-CM

## 2016-01-05 DIAGNOSIS — K7469 Other cirrhosis of liver: Secondary | ICD-10-CM

## 2016-01-14 ENCOUNTER — Ambulatory Visit
Admission: RE | Admit: 2016-01-14 | Discharge: 2016-01-14 | Disposition: A | Discharge status: Home / Self Care | Payer: Medicaid Other | Source: Ambulatory Visit | Attending: Nurse Practitioner | Admitting: Nurse Practitioner

## 2016-01-14 DIAGNOSIS — B182 Chronic viral hepatitis C: Secondary | ICD-10-CM

## 2016-01-14 DIAGNOSIS — K7469 Other cirrhosis of liver: Secondary | ICD-10-CM

## 2016-01-14 MED ORDER — IOHEXOL 300 MG/ML  SOLN
30.0000 mL | Freq: Once | INTRAMUSCULAR | Status: AC | PRN
  Administered 2016-01-14: 30 mL via ORAL

## 2016-02-24 ENCOUNTER — Encounter: Payer: Self-pay | Admitting: Nurse Practitioner

## 2016-04-12 ENCOUNTER — Telehealth: Payer: Self-pay | Admitting: Hematology

## 2016-04-12 NOTE — Telephone Encounter (Signed)
Contacted referring provider regarding the recent referral for pt.  Dr. Irene Limbo recommended pt be evaluated by PCP for dx and if a hem appt is require to contact the office for this pt.

## 2016-06-07 DIAGNOSIS — I671 Cerebral aneurysm, nonruptured: Secondary | ICD-10-CM

## 2016-06-07 DIAGNOSIS — I639 Cerebral infarction, unspecified: Secondary | ICD-10-CM

## 2016-06-07 DIAGNOSIS — Z8673 Personal history of transient ischemic attack (TIA), and cerebral infarction without residual deficits: Secondary | ICD-10-CM

## 2016-06-07 HISTORY — DX: Cerebral infarction, unspecified: I63.9

## 2016-06-07 HISTORY — DX: Cerebral aneurysm, nonruptured: I67.1

## 2016-06-07 HISTORY — DX: Personal history of transient ischemic attack (TIA), and cerebral infarction without residual deficits: Z86.73

## 2016-06-27 ENCOUNTER — Inpatient Hospital Stay (HOSPITAL_COMMUNITY)
Admission: EM | Admit: 2016-06-27 | Discharge: 2016-07-02 | DRG: 065 | Disposition: A | Discharge status: Rehabilitation Facility | Payer: Medicaid Other | Attending: Family Medicine | Admitting: Family Medicine

## 2016-06-27 ENCOUNTER — Emergency Department (HOSPITAL_COMMUNITY): Payer: Medicaid Other

## 2016-06-27 ENCOUNTER — Encounter (HOSPITAL_COMMUNITY): Payer: Self-pay

## 2016-06-27 DIAGNOSIS — R270 Ataxia, unspecified: Secondary | ICD-10-CM | POA: Diagnosis present

## 2016-06-27 DIAGNOSIS — Z7984 Long term (current) use of oral hypoglycemic drugs: Secondary | ICD-10-CM

## 2016-06-27 DIAGNOSIS — K7460 Unspecified cirrhosis of liver: Secondary | ICD-10-CM | POA: Diagnosis present

## 2016-06-27 DIAGNOSIS — G8191 Hemiplegia, unspecified affecting right dominant side: Secondary | ICD-10-CM | POA: Diagnosis present

## 2016-06-27 DIAGNOSIS — E785 Hyperlipidemia, unspecified: Secondary | ICD-10-CM | POA: Diagnosis present

## 2016-06-27 DIAGNOSIS — R471 Dysarthria and anarthria: Secondary | ICD-10-CM | POA: Diagnosis present

## 2016-06-27 DIAGNOSIS — Y906 Blood alcohol level of 120-199 mg/100 ml: Secondary | ICD-10-CM | POA: Diagnosis present

## 2016-06-27 DIAGNOSIS — D696 Thrombocytopenia, unspecified: Secondary | ICD-10-CM | POA: Diagnosis present

## 2016-06-27 DIAGNOSIS — K769 Liver disease, unspecified: Secondary | ICD-10-CM | POA: Diagnosis not present

## 2016-06-27 DIAGNOSIS — I639 Cerebral infarction, unspecified: Principal | ICD-10-CM | POA: Diagnosis present

## 2016-06-27 DIAGNOSIS — I6330 Cerebral infarction due to thrombosis of unspecified cerebral artery: Secondary | ICD-10-CM | POA: Diagnosis not present

## 2016-06-27 DIAGNOSIS — I672 Cerebral atherosclerosis: Secondary | ICD-10-CM | POA: Diagnosis present

## 2016-06-27 DIAGNOSIS — Z833 Family history of diabetes mellitus: Secondary | ICD-10-CM | POA: Diagnosis not present

## 2016-06-27 DIAGNOSIS — Z23 Encounter for immunization: Secondary | ICD-10-CM | POA: Diagnosis not present

## 2016-06-27 DIAGNOSIS — R29810 Facial weakness: Secondary | ICD-10-CM | POA: Diagnosis present

## 2016-06-27 DIAGNOSIS — F1010 Alcohol abuse, uncomplicated: Secondary | ICD-10-CM | POA: Diagnosis present

## 2016-06-27 DIAGNOSIS — N390 Urinary tract infection, site not specified: Secondary | ICD-10-CM | POA: Diagnosis not present

## 2016-06-27 DIAGNOSIS — I638 Other cerebral infarction: Secondary | ICD-10-CM | POA: Diagnosis not present

## 2016-06-27 DIAGNOSIS — R4701 Aphasia: Secondary | ICD-10-CM | POA: Diagnosis present

## 2016-06-27 DIAGNOSIS — I63012 Cerebral infarction due to thrombosis of left vertebral artery: Secondary | ICD-10-CM | POA: Diagnosis not present

## 2016-06-27 DIAGNOSIS — I6521 Occlusion and stenosis of right carotid artery: Secondary | ICD-10-CM | POA: Diagnosis present

## 2016-06-27 DIAGNOSIS — Z79899 Other long term (current) drug therapy: Secondary | ICD-10-CM | POA: Diagnosis not present

## 2016-06-27 DIAGNOSIS — K219 Gastro-esophageal reflux disease without esophagitis: Secondary | ICD-10-CM | POA: Diagnosis present

## 2016-06-27 DIAGNOSIS — R296 Repeated falls: Secondary | ICD-10-CM | POA: Diagnosis present

## 2016-06-27 DIAGNOSIS — I6350 Cerebral infarction due to unspecified occlusion or stenosis of unspecified cerebral artery: Secondary | ICD-10-CM | POA: Diagnosis not present

## 2016-06-27 DIAGNOSIS — I671 Cerebral aneurysm, nonruptured: Secondary | ICD-10-CM | POA: Diagnosis present

## 2016-06-27 DIAGNOSIS — K7030 Alcoholic cirrhosis of liver without ascites: Secondary | ICD-10-CM | POA: Diagnosis not present

## 2016-06-27 DIAGNOSIS — E1151 Type 2 diabetes mellitus with diabetic peripheral angiopathy without gangrene: Secondary | ICD-10-CM | POA: Diagnosis present

## 2016-06-27 DIAGNOSIS — Z8249 Family history of ischemic heart disease and other diseases of the circulatory system: Secondary | ICD-10-CM | POA: Diagnosis not present

## 2016-06-27 DIAGNOSIS — I10 Essential (primary) hypertension: Secondary | ICD-10-CM | POA: Diagnosis present

## 2016-06-27 DIAGNOSIS — B1920 Unspecified viral hepatitis C without hepatic coma: Secondary | ICD-10-CM | POA: Diagnosis present

## 2016-06-27 DIAGNOSIS — E663 Overweight: Secondary | ICD-10-CM | POA: Diagnosis present

## 2016-06-27 DIAGNOSIS — E1165 Type 2 diabetes mellitus with hyperglycemia: Secondary | ICD-10-CM | POA: Diagnosis present

## 2016-06-27 DIAGNOSIS — Z6827 Body mass index (BMI) 27.0-27.9, adult: Secondary | ICD-10-CM

## 2016-06-27 DIAGNOSIS — R300 Dysuria: Secondary | ICD-10-CM | POA: Diagnosis not present

## 2016-06-27 LAB — DIFFERENTIAL
Basophils Absolute: 0 10*3/uL (ref 0.0–0.1)
Basophils Relative: 0 %
Eosinophils Absolute: 0 10*3/uL (ref 0.0–0.7)
Eosinophils Relative: 0 %
Lymphocytes Relative: 35 %
Lymphs Abs: 2.6 10*3/uL (ref 0.7–4.0)
Monocytes Absolute: 0.6 10*3/uL (ref 0.1–1.0)
Monocytes Relative: 8 %
Neutro Abs: 4.2 10*3/uL (ref 1.7–7.7)
Neutrophils Relative %: 57 %

## 2016-06-27 LAB — RAPID URINE DRUG SCREEN, HOSP PERFORMED
Amphetamines: NOT DETECTED
Barbiturates: NOT DETECTED
Benzodiazepines: NOT DETECTED
Cocaine: POSITIVE — AB
Opiates: NOT DETECTED
Tetrahydrocannabinol: NOT DETECTED

## 2016-06-27 LAB — URINALYSIS, ROUTINE W REFLEX MICROSCOPIC
Bilirubin Urine: NEGATIVE
Glucose, UA: NEGATIVE mg/dL
Ketones, ur: 5 mg/dL — AB
Nitrite: POSITIVE — AB
Protein, ur: 100 mg/dL — AB
Specific Gravity, Urine: 1.035 — ABNORMAL HIGH (ref 1.005–1.030)
pH: 6 (ref 5.0–8.0)

## 2016-06-27 LAB — COMPREHENSIVE METABOLIC PANEL
ALT: 37 U/L (ref 14–54)
AST: 56 U/L — ABNORMAL HIGH (ref 15–41)
Albumin: 3.5 g/dL (ref 3.5–5.0)
Alkaline Phosphatase: 104 U/L (ref 38–126)
Anion gap: 13 (ref 5–15)
BUN: 11 mg/dL (ref 6–20)
CO2: 18 mmol/L — ABNORMAL LOW (ref 22–32)
Calcium: 9.5 mg/dL (ref 8.9–10.3)
Chloride: 106 mmol/L (ref 101–111)
Creatinine, Ser: 0.78 mg/dL (ref 0.44–1.00)
GFR calc Af Amer: >60 mL/min (ref >60)
GFR calc non Af Amer: >60 mL/min (ref >60)
Glucose, Bld: 114 mg/dL — ABNORMAL HIGH (ref 65–99)
Potassium: 3.4 mmol/L — ABNORMAL LOW (ref 3.5–5.1)
Sodium: 137 mmol/L (ref 135–145)
Total Bilirubin: 0.8 mg/dL (ref 0.3–1.2)
Total Protein: 7.9 g/dL (ref 6.5–8.1)

## 2016-06-27 LAB — I-STAT CHEM 8, ED
BUN: 15 mg/dL (ref 6–20)
Calcium, Ion: 1.17 mmol/L (ref 1.15–1.40)
Chloride: 107 mmol/L (ref 101–111)
Creatinine, Ser: 1 mg/dL (ref 0.44–1.00)
Glucose, Bld: 117 mg/dL — ABNORMAL HIGH (ref 65–99)
HCT: 35 % — ABNORMAL LOW (ref 36.0–46.0)
Hemoglobin: 11.9 g/dL — ABNORMAL LOW (ref 12.0–15.0)
Potassium: 3.4 mmol/L — ABNORMAL LOW (ref 3.5–5.1)
Sodium: 142 mmol/L (ref 135–145)
TCO2: 21 mmol/L (ref 0–100)

## 2016-06-27 LAB — I-STAT TROPONIN, ED: Troponin i, poc: 0.01 ng/mL (ref 0.00–0.08)

## 2016-06-27 LAB — CBC
HCT: 33.1 % — ABNORMAL LOW (ref 36.0–46.0)
Hemoglobin: 10.9 g/dL — ABNORMAL LOW (ref 12.0–15.0)
MCH: 25.1 pg — ABNORMAL LOW (ref 26.0–34.0)
MCHC: 32.9 g/dL (ref 30.0–36.0)
MCV: 76.3 fL — ABNORMAL LOW (ref 78.0–100.0)
Platelets: 100 10*3/uL — ABNORMAL LOW (ref 150–400)
RBC: 4.34 MIL/uL (ref 3.87–5.11)
RDW: 16.6 % — ABNORMAL HIGH (ref 11.5–15.5)
WBC: 7.3 10*3/uL (ref 4.0–10.5)

## 2016-06-27 LAB — APTT: aPTT: 28 seconds (ref 24–36)

## 2016-06-27 LAB — PROTIME-INR
INR: 1.18
Prothrombin Time: 15 seconds (ref 11.4–15.2)

## 2016-06-27 LAB — CBG MONITORING, ED: Glucose-Capillary: 117 mg/dL — ABNORMAL HIGH (ref 65–99)

## 2016-06-27 LAB — ETHANOL: Alcohol, Ethyl (B): 134 mg/dL — ABNORMAL HIGH (ref <5)

## 2016-06-27 MED ORDER — CLOPIDOGREL BISULFATE 75 MG PO TABS
75.0000 mg | ORAL_TABLET | Freq: Every day | ORAL | Status: DC
  Administered 2016-06-28 – 2016-06-29 (×2): 75 mg via ORAL
  Filled 2016-06-27 (×2): qty 1

## 2016-06-27 MED ORDER — IOPAMIDOL (ISOVUE-370) INJECTION 76%
INTRAVENOUS | Status: AC
  Administered 2016-06-27: 50 mL
  Filled 2016-06-27: qty 100

## 2016-06-27 MED ORDER — CLOPIDOGREL BISULFATE 300 MG PO TABS
300.0000 mg | ORAL_TABLET | Freq: Once | ORAL | Status: AC
  Administered 2016-06-27: 300 mg via ORAL
  Filled 2016-06-27: qty 1

## 2016-06-27 MED ORDER — CLOPIDOGREL BISULFATE 300 MG PO TABS: 300.0000 mg | ORAL_TABLET | Freq: Every day | ORAL | Status: DC

## 2016-06-27 MED ORDER — ASPIRIN EC 325 MG PO TBEC
325.0000 mg | DELAYED_RELEASE_TABLET | Freq: Every day | ORAL | Status: DC
  Administered 2016-06-27 – 2016-07-01 (×5): 325 mg via ORAL
  Filled 2016-06-27 (×6): qty 1

## 2016-06-27 NOTE — ED Provider Notes (Signed)
[facility] DEPT Provider Note   CSN: [id number] Arrival date & time: 06/27/16  2010     History   Chief Complaint Chief Complaint  Patient presents with  . Code Stroke    HPI Deborah Wise is a 66 y.o. female.  HPI 66 year old female with a history of diabetes, hypertension, hepatitis C presenting with dysarthria and right-sided weakness. She states that her difficulty speaking started yesterday morning and she started noticing right-sided weakness today. She denies headache, chest pain, shortness of breath, nausea, vomiting, diarrhea, abdominal pain. She denies any fevers. She has never had symptoms like this in the past.  Code stroke called by EMS  Past Medical History:  Diagnosis Date  . Arthritis   . Diabetes mellitus without complication ([place])   . Fibroid, uterine   . GERD (gastroesophageal reflux disease)   . Hepatitis    hep C  . Hypertension   . Thrombocytopenia ([place]) 12/25/2014  . Urinary incontinence    Patient noticed mild and is not currently a significant problem    Patient Active Problem List   Diagnosis Date Noted  . Microcytic anemia 12/27/2014  . Thrombocytopenia ([place]) 12/25/2014  . Hypoglycemia 07/21/2013  . Diabetes mellitus ([place]) 07/21/2013  . Hypertension 07/21/2013  . Liver disease 07/21/2013  . Alcohol abuse 07/21/2013    Past Surgical History:  Procedure Laterality Date  . ANKLE SURGERY    . broken ankle    . MULTIPLE EXTRACTIONS WITH ALVEOLOPLASTY  10/25/2011   Procedure: MULTIPLE EXTRACION WITH ALVEOLOPLASTY;  Surgeon: Gae Bon, DDS;  Location: [facility];  Service: Oral Surgery;  Laterality: Bilateral;  Extract teeth numbers one, two, six, seven, eight, nine, ten, eleven, twelve, fifteen, sixteen, eighteen, nineteen, twenty-three, twenty-four, twenty-five, twenty-seven, thirty-two and alveoplasty.    OB History    Gravida Para Term Preterm AB Living   5 3 3   2 3    SAB TAB Ectopic Multiple Live Births   1 1              Home Medications    Prior to Admission medications   Medication Sig Start Date End Date Taking? Authorizing Provider  ACCU-CHEK AVIVA PLUS test strip USE TO CHECK BLOOD SUGAR TWICE A DAY 12/06/14   Historical Provider, MD  amLODipine (NORVASC) 10 MG tablet Take 1 tablet (10 mg total) by mouth daily. 02/12/15   Quintella Reichert, MD  glucose monitoring kit (FREESTYLE) monitoring kit 1 each by Does not apply route 4 (four) times daily - after meals and at bedtime. 1 month Diabetic Testing Supplies for QAC-QHS accuchecks. Any brand OK 07/22/13   Thurnell Lose, MD  metFORMIN (GLUCOPHAGE) 500 MG tablet Take 500 mg by mouth daily with breakfast.     Historical Provider, MD  oxyCODONE (ROXICODONE) 5 MG immediate release tablet Take 1 tablet (5 mg total) by mouth every 4 (four) hours as needed for severe pain. 02/12/15   Quintella Reichert, MD  thiamine (VITAMIN B-1) 100 MG tablet Take 1 tablet (100 mg total) by mouth daily. 07/22/13   Thurnell Lose, MD    Family History Family History  Problem Relation Age of Onset  . Diabetes type II Mother   . Hypertension Mother   . Cancer Father     patient does not know what type of cancer    Social History Social History  Substance Use Topics  . Smoking status: Never Smoker  . Smokeless tobacco: Never Used  . Alcohol use 0.6 oz/week  1 Cans of beer per week     Comment: 1/2 bottle of wine and some beer a day     Allergies   Patient has no known allergies.   Review of Systems Review of Systems  Constitutional: Negative for chills and fever.  Respiratory: Negative for cough and shortness of breath.   Cardiovascular: Negative for chest pain and palpitations.  Gastrointestinal: Negative for abdominal pain, constipation, diarrhea, nausea and vomiting.  Musculoskeletal: Negative for back pain and neck pain.  Neurological: Positive for speech difficulty and weakness. Negative for dizziness, syncope, facial asymmetry, numbness and headaches.  All  other systems reviewed and are negative.    Physical Exam Updated Vital Signs BP 192/76 (BP Location: Right Arm)   Pulse 88   Temp 97.9 F (36.6 C)   Resp 15   Ht 5' 5"  (1.651 m)   Wt 74 kg   SpO2 98%   BMI 27.15 kg/m   Physical Exam  Constitutional: She is oriented to person, place, and time. She appears well-developed and well-nourished. No distress.  HENT:  Head: Normocephalic and atraumatic.  Eyes: Conjunctivae are normal.  Neck: Neck supple.  Cardiovascular: Normal rate and regular rhythm.   No murmur heard. Pulmonary/Chest: Effort normal and breath sounds normal. No respiratory distress.  Abdominal: Soft. There is no tenderness.  Musculoskeletal: She exhibits no edema.  Neurological: She is alert and oriented to person, place, and time. She has normal reflexes. No cranial nerve deficit or sensory deficit. GCS eye subscore is 4. GCS verbal subscore is 5. GCS motor subscore is 6.  Patient noted to have moderate dysarthria. 5/5 strength in LUE and LLE 4/5 strength in RUE and RLE.  Skin: Skin is warm and dry.  Psychiatric: She has a normal mood and affect.  Nursing note and vitals reviewed.    ED Treatments / Results  Labs (all labs ordered are listed, but only abnormal results are displayed) Labs Reviewed  ETHANOL - Abnormal; Notable for the following:       Result Value   Alcohol, Ethyl (B) 134 (*)    All other components within normal limits  CBC - Abnormal; Notable for the following:    Hemoglobin 10.9 (*)    HCT 33.1 (*)    MCV 76.3 (*)    MCH 25.1 (*)    RDW 16.6 (*)    Platelets 100 (*)    All other components within normal limits  COMPREHENSIVE METABOLIC PANEL - Abnormal; Notable for the following:    Potassium 3.4 (*)    CO2 18 (*)    Glucose, Bld 114 (*)    AST 56 (*)    All other components within normal limits  I-STAT CHEM 8, ED - Abnormal; Notable for the following:    Potassium 3.4 (*)    Glucose, Bld 117 (*)    Hemoglobin 11.9 (*)     HCT 35.0 (*)    All other components within normal limits  CBG MONITORING, ED - Abnormal; Notable for the following:    Glucose-Capillary 117 (*)    All other components within normal limits  PROTIME-INR  APTT  DIFFERENTIAL  RAPID URINE DRUG SCREEN, HOSP PERFORMED  URINALYSIS, ROUTINE W REFLEX MICROSCOPIC  I-STAT TROPOININ, ED    EKG  EKG Interpretation None       Radiology Ct Head Code Stroke W/o Cm  Result Date: 06/27/2016 CLINICAL DATA:  Code stroke. Initial evaluation for acute right-sided weakness. EXAM: CT HEAD WITHOUT CONTRAST TECHNIQUE: Contiguous  axial images were obtained from the base of the skull through the vertex without intravenous contrast. COMPARISON:  None. FINDINGS: Brain: Age-related cerebral atrophy with mild chronic small vessel ischemic disease. No acute intracranial hemorrhage. There is a subtle hypodensity measuring approximately 16 mm within the left paramedian pons, suspicious for evolving acute ischemic infarct (series 2, image 8). No associated mass effect. No other evidence for acute large vessel territory infarct. Gray-white matter differentiation otherwise maintained. No mass lesion, midline shift, or mass effect. No hydrocephalus. No extra-axial fluid collection. Vascular: No hyperdense vessel. Scattered vascular calcifications present within the carotid siphons. Skull: Scalp soft tissues within normal limits.  Calvarium intact. Sinuses/Orbits: Globes and orbital soft tissues within normal limits. Scattered mucosal thickening within the ethmoidal air cells. Paranasal sinuses are otherwise clear. No mastoid effusion. ASPECTS [facility] Stroke Program Early CT Score) - Ganglionic level infarction (caudate, lentiform nuclei, internal capsule, insula, M1-M3 cortex): 7 - Supraganglionic infarction (M4-M6 cortex): 3 Total score (0-10 with 10 being normal): 10 IMPRESSION: 1. Focal hypodensity within the left paramedian pons, suspicious for evolving acute ischemic  infarct. 2. ASPECTS is 10 3. Age-related cerebral atrophy with mild chronic small vessel ischemic disease. Critical Value/emergent results were called by telephone at the time of interpretation on 06/27/2016 at 8:27 pm to Dr. Kathrynn Speed, who verbally acknowledged these results. Electronically Signed   By: Jeannine Boga M.D.   On: 06/27/2016 20:33    Procedures Procedures (including critical care time)  Medications Ordered in ED Medications  clopidogrel (PLAVIX) tablet 300 mg (not administered)  iopamidol (ISOVUE-370) 76 % injection (50 mLs  Contrast Given 06/27/16 2022)     Initial Impression / Assessment and Plan / ED Course  I have reviewed the triage vital signs and the nursing notes.  Pertinent labs & imaging results that were available during my care of the patient were reviewed by me and considered in my medical decision making (see chart for details).     66 year old female brought in for a code stroke with right-sided weakness and dysarthria. It was later found out that her symptoms started yesterday morning. 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 Due to the time of onset being yesterday, neurology does not bleed she is a candidate for TPA. She was Plavix and aspirin use on neurology. She'll be admitted to the hospital service for further stroke workup.  Patient care discussed and supervised by my attending, Dr. Reather Converse. Drucie Ip, MD   Final Clinical Impressions(s) / ED Diagnoses   Final diagnoses:  Stroke (cerebrum) [facility])  Stroke (cerebrum) [facility])    New Prescriptions New Prescriptions   No medications on file     Nathan Mali Page, MD 06/27/16 2332    Elnora Morrison, MD 06/28/16 0040

## 2016-06-27 NOTE — ED Triage Notes (Signed)
Pt arrives via [facility] as code stroke. Dr. Leonel Ramsay at bridge. Pt has Right sided weakness and aphasia that began at unclear time. Pt states she has "been falling since this morning" and reports that she has had slurred speech since last night "but couldn't figure out why". Pt A&O. NAD.

## 2016-06-27 NOTE — Consult Note (Signed)
Neurology Consultation Reason for Consult: Right-sided weakness Referring Physician: Rosalyn Gess  CC: Right-sided weakness  History is obtained from: Patient  HPI: Kalolaine Hiott is a 66 y.o. female the history of diabetes, hypertension who presents with unsteadiness which started 2 days ago, but worsened this evening. Earlier today, she was able to walk but was falling frequently, then after she got home around 5 PM, she was unable to walk and therefore she was brought to the ER. MS Contin could stroke on route, but her last level was actually 2 days ago and therefore she is not a candidate for IV TPA.  Given her worsening, on CT head was performed which does not demonstrate any large vessel occlusion.   LKW: 2 days ago tpa given?: no, out of window    ROS: A 14 point ROS was performed and is negative except as noted in the HPI.   Past Medical History:  Diagnosis Date  . Arthritis   . Diabetes mellitus without complication ([place])   . Fibroid, uterine   . GERD (gastroesophageal reflux disease)   . Hepatitis    hep C  . Hypertension   . Thrombocytopenia ([place]) 12/25/2014  . Urinary incontinence    Patient noticed mild and is not currently a significant problem     Family History  Problem Relation Age of Onset  . Diabetes type II Mother   . Hypertension Mother   . Cancer Father     patient does not know what type of cancer     Social History:  reports that she has never smoked. She has never used smokeless tobacco. She reports that she drinks about 0.6 oz of alcohol per week . She reports that she does not use drugs.   Exam: Current vital signs: Wt 74 kg (163 lb 2.3 oz)   BMI 27.15 kg/m  Vital signs in last 24 hours: Weight:  [74 kg (163 lb 2.3 oz)] 74 kg (163 lb 2.3 oz) (01/21 2033)   Physical Exam  Constitutional: Appears well-developed and well-nourished.  Psych: Affect appropriate to situation Eyes: No scleral injection HENT: No OP obstrucion Head:  Normocephalic.  Cardiovascular: Normal rate and regular rhythm.  Respiratory: Effort normal and breath sounds normal to anterior ascultation GI: Soft.  No distension. There is no tenderness.  Skin: WDI  Neuro: Mental Status: Patient is awake, alert, oriented to person, place, month, year, and situation. Patient is able to give a clear and coherent history. No signs of aphasia or neglect Cranial Nerves: II: Visual Fields are full. Pupils are equal, round, and reactive to light.   III,IV, VI: EOMI without ptosis or diploplia.  V: Facial sensation is decreased on the left 10 VII: Facial movement is symmetric.  VIII: hearing is intact to voice X: Uvula elevates symmetrically XI: Shoulder shrug is symmetric. XII: tongue is midline without atrophy or fasciculations.  Motor: Tone is normal. Bulk is normal. She has 4/5 weakness in the right arm and leg, but discoordination 4 out of proportion. Sensory: Sensation is decreased to pin on the left Cerebellar: She has ataxia of the left arm, and of the right arm and leg.   I have reviewed labs in epic and the results pertinent to this consultation are: Creatinine 1.0  I have reviewed the images obtained: CT head-unremarkable  Impression: 66 year old female with pontine infarct. Given the progression, I would favor starting dual antiplatelet therapy for now.  Recommendations: 1. HgbA1c, fasting lipid panel 2. MRI, MRA  of the brain  without contrast 3. Frequent neuro checks 4. Echocardiogram 5. Carotid dopplers 6. Prophylactic therapy-Antiplatelet med: Aspirin - dose 325mg  PO or 300mg  PR, load with Plavix 300 mg followed by 75 mg daily 7. Risk factor modification 8. Telemetry monitoring 9. PT consult, OT consult, Speech consult 10. please page stroke NP  Or  PA  Or MD  from 8am -4 pm starting 1/22 as this patient will be followed by the stroke team at this point.   You can look them up on [email or web address]      Roland Rack,  MD [facility] [phone]  If 7pm- 7am, please page neurology on call as listed in [place].

## 2016-06-27 NOTE — ED Notes (Signed)
Admitting MD at bedside.

## 2016-06-27 NOTE — Progress Notes (Signed)
Code Stroke called @ 1949 and arrived to ED at 2009 via [facility] with complaints of R sided weakness and unsteadiness.  Patient states this started approximately 2 days ago but got worse this PM around 1700.  Pt VS T-97.9, HR-80, BP-180/88, RR-21 on RA, cbg-117.  Pt has hx of arthritis, fibroids, gerd, hep c, thrombocytopenia, HTN, DM, and urinary incontinence.  NIH-6 for minor facial palsy, R leg drift, limb ataxia(L arm, B legs), partial sensory loss, and mild dysarthria. CT and CTA done. No TPA d/t patient being out of window.  Pt to be admitted to [facility].

## 2016-06-27 NOTE — H&P (Signed)
[facility] Admission History and Physical Service Pager: [phone]  Patient name: Deborah Wise Medical record number: [id number] Date of birth: May 15, 1951 Age: 66 y.o. Gender: female  Primary Care Provider: Pcp Not In System Consultants: neurology Code Status: full, obtained on admission.  Chief Complaint: left sided weakness and fall  Assessment and Plan: Deborah Wise is a 66 y.o. female presenting with left-sided weakness. 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 Neuro exam remarkable for some right facial drooping, right pronator drift and left hemiparesis consistent with CT and CTA finding of  left paramedian pons acute infarct. CTA also showed atheromatous stenoses about the carotid bifurcations bilaterally, measuring up to 80% on the right and 40% on the left. 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 Unfortunately, she is outside the window for TPA as she has had unsteadiness for three days. She also have ataxia which could've contributed to her fall. Alcohol level was 134 on arrival to ED. Patient's risk factor for CVA are diabetes, hypertension and age. Patient was not compliant with his antihypertensive medication and diabetic medications.  -Admit to stepdown unit. Attending Dr. Andria Frames -Neuro consulted in ED and appreciate recs:  -Plavix loading dose 300 mg followed by 75 mg daily  -Aspirin 325 mg daily -MRI/MRA w/o contrast  -Carotid doppler  -Echo -Risk stratification labs (lipid panel, A1c and TSH)  -PT/OT/SLP -UDS. EKG and Trop level -Neurovascular check q2hrs -Cardiac monitor -Permissive hypertension -Atorvastatin after bedside swallow -Fall precaution -SCD pending MRI  Hypertension: Hypertensive to 190/70 in ED. Not  compliant with amlodipine and lisinopril -Permissive hypertension for now  Hyperglycemia: Last A1c 4.9 in 2015. She says she was on metformin but not taking -Repeat A1c -SSI  Alcohol use disorder: reports drinking as much as she can get. Last drink was this afternoon. Alcohol level 134 in ED. Exam with ataxia. -CIWA  Hepatitis C/liver cirrhosis: last viral load 149K in 12/2014. Not immune to hep B. No sign of ascites. She was followed Zamor Lynn Ito, MD. at [facility]. She was at week 5 of 12 on [place] as of 04/20/2016.  -Call in the morning  FEN/GI:  -Heart healthy. Passed bedside swallow -Saline lock  Prophylaxis: SCD pending MRI  Disposition: Admit to neuro SDU for stroke.   History of Present Illness:  Deborah Wise is a 66 y.o. female presenting with left-sided weakness  Patient reports feeling unsteady for the last 3 days. Her unsteadiness was worse this afternoon and she fell 4 times. She denies hitting her head or loss of consciousness. However, she reports difficulty getting up after fall. Last fall was about 7 PM when she couldn't get up and they had to call EMS. She also reports speech change and right sided weakness.   Patient came to ED as code stroke. She was outside the window for TPA. CT head was obtained and showed focal hypodensity within the left paramedian pons, suspicious for evolving acute ischemic infarct. CTA head and neck was done and showed evolving left pontine infarct which was stable from previous CT. There was no emergent large vessel occlusion. There was also atheromatous stenoses about the carotid bifurcations bilaterally, measuring up to 80% on the right and 40% on the left. 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 CMP,  CBC and PT/INR were basically normal. UA with rare bacteria, trace LE, small Hgb and nitrite but she denies dysuria.   Patient denies vision changes, numbness or tingling. She denies  palpitation, chest pain, shortness of breath, nausea, vomiting, abdominal pain, dysuria, leg swelling or pain.  Patient denies smoking cigarettes or using recreational drug. She admits drinking alcohol. Last drink was this afternoon.   ROS A 12 point review of system was negative except for the pertinent negatives and positives in history of present illness  Patient Active Problem List   Diagnosis Date Noted  . Stroke (cerebrum) ([place]) 06/27/2016  . Microcytic anemia 12/27/2014  . Thrombocytopenia ([place]) 12/25/2014  . Hypoglycemia 07/21/2013  . Diabetes mellitus ([place]) 07/21/2013  . Hypertension 07/21/2013  . Liver disease 07/21/2013  . Alcohol abuse 07/21/2013    Past Medical History: Past Medical History:  Diagnosis Date  . Arthritis   . Diabetes mellitus without complication ([place])   . Fibroid, uterine   . GERD (gastroesophageal reflux disease)   . Hepatitis    hep C  . Hypertension   . Thrombocytopenia ([place]) 12/25/2014  . Urinary incontinence    Patient noticed mild and is not currently a significant problem    Past Surgical History: Past Surgical History:  Procedure Laterality Date  . ANKLE SURGERY    . broken ankle    . MULTIPLE EXTRACTIONS WITH ALVEOLOPLASTY  10/25/2011   Procedure: MULTIPLE EXTRACION WITH ALVEOLOPLASTY;  Surgeon: Gae Bon, DDS;  Location: [place];  Service: Oral Surgery;  Laterality: Bilateral;  Extract teeth numbers one, two, six, seven, eight, nine, ten, eleven, twelve, fifteen, sixteen, eighteen, nineteen, twenty-three, twenty-four, twenty-five, twenty-seven, thirty-two and alveoplasty.    Social History: Social History  Substance Use Topics  . Smoking status: Never Smoker  . Smokeless tobacco: Never Used  . Alcohol use 0.6 oz/week    1 Cans of beer per week     Comment: 1/2 bottle of wine and some beer a day   Additional social history:   Please also refer to relevant sections of EMR.  Family History: Family History  Problem Relation  Age of Onset  . Diabetes type II Mother   . Hypertension Mother   . Cancer Father     patient does not know what type of cancer   (If not completed, MUST add something in)  Allergies and Medications: No Known Allergies No current facility-administered medications on file prior to encounter.    Current Outpatient Prescriptions on File Prior to Encounter  Medication Sig Dispense Refill  . amLODipine (NORVASC) 10 MG tablet Take 1 tablet (10 mg total) by mouth daily. 30 tablet 0  . ACCU-CHEK AVIVA PLUS test strip USE TO CHECK BLOOD SUGAR TWICE A DAY  3  . glucose monitoring kit (FREESTYLE) monitoring kit 1 each by Does not apply route 4 (four) times daily - after meals and at bedtime. 1 month Diabetic Testing Supplies for QAC-QHS accuchecks. Any brand OK 1 each 1  . metFORMIN (GLUCOPHAGE) 500 MG tablet Take 500 mg by mouth daily with breakfast.     . oxyCODONE (ROXICODONE) 5 MG immediate release tablet Take 1 tablet (5 mg total) by mouth every 4 (four) hours as needed for severe pain. (Patient not taking: Reported on 06/27/2016) 8 tablet 0  . thiamine (VITAMIN B-1) 100 MG tablet Take 1 tablet (100 mg total) by mouth daily. (Patient not taking: Reported on 06/27/2016) 30 tablet 0    Objective: BP 191/78   Pulse  79   Temp 97.9 F (36.6 C)   Resp 22   Ht _0  (1.651 m)   Wt 163 lb 2.3 oz (74 kg)   SpO2 100%   BMI 27.15 kg/m  Exam: GEN: lying in bed, no apparent distress. Head: normocephalic and atraumatic  Eyes: conjunctiva without injection, sclera anicteric CVS: RRR, nl s1 & s2, no murmurs, no edema RESP: speaks in full sentence, no IWOB, good air movement bilaterally, CTAB GI: BS present & normal, soft, NTND, no guarding, no rebound, no mass MSK: no focal tenderness or notable swelling SKIN: no apparent skin lesion NEURO: alert, awake and oriented x4, aphasic, CN II-VII intact except for right CN VII deficit (upper neuron), motor 4/5 in RUE and RLE, light sensation intact in all  dermatomes, right pronator drift, mild ataxia on finger to nose bilaterally, patellar reflex 2+ bilaterally, gait deferred.  PSYCH: euthymic mood with congruent affect   Labs and Imaging: CBC BMET   Recent Labs Lab 06/27/16 2011 06/27/16 2017  WBC 7.3  --   HGB 10.9* 11.9*  HCT 33.1* 35.0*  PLT 100*  --     Recent Labs Lab 06/27/16 2011 06/27/16 2017  NA 137 142  K 3.4* 3.4*  CL 106 107  CO2 18*  --   BUN 11 15  CREATININE 0.78 1.00  GLUCOSE 114* 117*  CALCIUM 9.5  --      Ct Angio Head W Or Wo Contrast  Result Date: 06/27/2016 IMPRESSION: 1. Negative CTA for emergent large vessel occlusion. Evolving left pontine infarct stable from previous CT. 2. 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 3. Moderate atheromatous disease involving the anterior and posterior circulations as above, most notable within the distal ACAs and right PCA. 4. 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 Critical Value/emergent results were called by telephone at the time of interpretation on 06/27/2016 at 9:07 pm to Dr. Roland Rack , who verbally acknowledged these results. Electronically Signed   By: Jeannine Boga M.D.   On: 06/27/2016 21:10   Ct Angio Neck W Or Wo Contrast  Result Date: 06/27/2016  IMPRESSION: 1. Negative CTA for emergent large vessel occlusion. Evolving left pontine infarct stable from previous CT. 2. 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 3. Moderate atheromatous disease involving the anterior and posterior circulations as above, most notable within the distal ACAs and right PCA. 4. 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 Critical Value/emergent results were called by telephone at the time of interpretation on 06/27/2016 at 9:07 pm to Dr. Roland Rack , who verbally acknowledged these results.  Electronically Signed   By: Jeannine Boga M.D.   On: 06/27/2016 21:10   Ct Head Code Stroke W/o Cm  Result Date: 06/27/2016  IMPRESSION: 1. Focal hypodensity within the left paramedian pons, suspicious for evolving acute ischemic infarct. 2. ASPECTS is 10 3. Age-related cerebral atrophy with mild chronic small vessel ischemic disease. Critical Value/emergent results were called by telephone at the time of interpretation on 06/27/2016 at 8:27 pm to Dr. Kathrynn Speed, who verbally acknowledged these results. Electronically Signed   By: Jeannine Boga M.D.   On: 06/27/2016 20:33    Mercy Riding, MD 06/27/2016, 10:23 PM PGY-2, [place] [facility] pager: [phone], text pages welcome

## 2016-06-28 ENCOUNTER — Inpatient Hospital Stay (HOSPITAL_COMMUNITY): Payer: Medicaid Other

## 2016-06-28 DIAGNOSIS — I63012 Cerebral infarction due to thrombosis of left vertebral artery: Secondary | ICD-10-CM | POA: Diagnosis present

## 2016-06-28 DIAGNOSIS — I6350 Cerebral infarction due to unspecified occlusion or stenosis of unspecified cerebral artery: Secondary | ICD-10-CM

## 2016-06-28 DIAGNOSIS — F1410 Cocaine abuse, uncomplicated: Secondary | ICD-10-CM | POA: Insufficient documentation

## 2016-06-28 LAB — ECHOCARDIOGRAM COMPLETE
E decel time: 264 msec
E/e' ratio: 12.19
FS: 32 % (ref 28–44)
Height: 65 in
IVS/LV PW RATIO, ED: 0.78
LA ID, A-P, ES: 40 mm
LA diam end sys: 40 mm
LA diam index: 2.21 cm/m2
LA vol A4C: 62.3 ml
LA vol index: 37.2 mL/m2
LA vol: 67.3 mL
LV E/e' medial: 12.19
LV E/e'average: 12.19
LV PW d: 20.1 mm — AB (ref 0.6–1.1)
LV e' LATERAL: 7.18 cm/s
LVOT area: 3.46 cm2
LVOT diameter: 21 mm
Lateral S' vel: 13.7 cm/s
MV Dec: 264
MV Peak grad: 3 mmHg
MV pk A vel: 104 m/s
MV pk E vel: 87.5 m/s
TAPSE: 18.4 mm
TDI e' lateral: 7.18
TDI e' medial: 7.94
Weight: 2610.25 oz

## 2016-06-28 LAB — MRSA PCR SCREENING: MRSA by PCR: NEGATIVE

## 2016-06-28 LAB — TROPONIN I: Troponin I: <0.03 ng/mL (ref <0.03)

## 2016-06-28 LAB — RAPID URINE DRUG SCREEN, HOSP PERFORMED
Amphetamines: NOT DETECTED
Barbiturates: NOT DETECTED
Benzodiazepines: NOT DETECTED
Cocaine: POSITIVE — AB
Opiates: NOT DETECTED
Tetrahydrocannabinol: NOT DETECTED

## 2016-06-28 LAB — PHOSPHORUS: Phosphorus: 3.9 mg/dL (ref 2.5–4.6)

## 2016-06-28 LAB — LIPID PANEL
Cholesterol: 190 mg/dL (ref 0–200)
HDL: 48 mg/dL (ref >40)
LDL Cholesterol: 128 mg/dL — ABNORMAL HIGH (ref 0–99)
Total CHOL/HDL Ratio: 4 RATIO
Triglycerides: 70 mg/dL (ref <150)
VLDL: 14 mg/dL (ref 0–40)

## 2016-06-28 LAB — TSH: TSH: 3.314 u[IU]/mL (ref 0.350–4.500)

## 2016-06-28 LAB — CBC
HCT: 31.9 % — ABNORMAL LOW (ref 36.0–46.0)
Hemoglobin: 10.2 g/dL — ABNORMAL LOW (ref 12.0–15.0)
MCH: 24.2 pg — ABNORMAL LOW (ref 26.0–34.0)
MCHC: 32 g/dL (ref 30.0–36.0)
MCV: 75.8 fL — ABNORMAL LOW (ref 78.0–100.0)
Platelets: 100 10*3/uL — ABNORMAL LOW (ref 150–400)
RBC: 4.21 MIL/uL (ref 3.87–5.11)
RDW: 16.3 % — ABNORMAL HIGH (ref 11.5–15.5)
WBC: 8 10*3/uL (ref 4.0–10.5)

## 2016-06-28 LAB — CREATININE, SERUM
Creatinine, Ser: 0.67 mg/dL (ref 0.44–1.00)
GFR calc Af Amer: >60 mL/min (ref >60)
GFR calc non Af Amer: >60 mL/min (ref >60)

## 2016-06-28 LAB — ETHANOL: Alcohol, Ethyl (B): <5 mg/dL (ref <5)

## 2016-06-28 LAB — MAGNESIUM: Magnesium: 1.7 mg/dL (ref 1.7–2.4)

## 2016-06-28 MED ORDER — FOLIC ACID 5 MG/ML IJ SOLN
1.0000 mg | Freq: Every day | INTRAMUSCULAR | Status: DC
  Filled 2016-06-28: qty 0.2

## 2016-06-28 MED ORDER — LORAZEPAM 2 MG/ML IJ SOLN: 2.0000 mg | INTRAMUSCULAR | Status: DC | PRN

## 2016-06-28 MED ORDER — STROKE: EARLY STAGES OF RECOVERY BOOK
Freq: Once | Status: DC
  Filled 2016-06-28: qty 1

## 2016-06-28 MED ORDER — ENOXAPARIN SODIUM 40 MG/0.4ML ~~LOC~~ SOLN
40.0000 mg | Freq: Every day | SUBCUTANEOUS | Status: DC
  Administered 2016-06-28 – 2016-07-01 (×3): 40 mg via SUBCUTANEOUS
  Filled 2016-06-28 (×6): qty 0.4

## 2016-06-28 MED ORDER — INFLUENZA VAC SPLIT QUAD 0.5 ML IM SUSY
0.5000 mL | PREFILLED_SYRINGE | INTRAMUSCULAR | Status: AC
  Administered 2016-06-29: 0.5 mL via INTRAMUSCULAR
  Filled 2016-06-28: qty 0.5

## 2016-06-28 MED ORDER — FOLIC ACID 1 MG PO TABS
1.0000 mg | ORAL_TABLET | Freq: Every day | ORAL | Status: DC
  Administered 2016-06-28 – 2016-07-01 (×4): 1 mg via ORAL
  Filled 2016-06-28 (×5): qty 1

## 2016-06-28 MED ORDER — VITAMIN B-1 100 MG PO TABS
100.0000 mg | ORAL_TABLET | Freq: Every day | ORAL | Status: DC
  Administered 2016-06-28 – 2016-07-01 (×4): 100 mg via ORAL
  Filled 2016-06-28 (×5): qty 1

## 2016-06-28 MED ORDER — LABETALOL HCL 5 MG/ML IV SOLN
10.0000 mg | INTRAVENOUS | Status: DC | PRN
  Administered 2016-06-28: 10 mg via INTRAVENOUS
  Filled 2016-06-28: qty 4

## 2016-06-28 MED ORDER — SENNOSIDES-DOCUSATE SODIUM 8.6-50 MG PO TABS
1.0000 | ORAL_TABLET | Freq: Every evening | ORAL | Status: DC | PRN
  Filled 2016-06-28: qty 1

## 2016-06-28 MED ORDER — ACETAMINOPHEN 650 MG RE SUPP: 650.0000 mg | RECTAL | Status: DC | PRN

## 2016-06-28 MED ORDER — ACETAMINOPHEN 325 MG PO TABS: 650.0000 mg | ORAL_TABLET | ORAL | Status: DC | PRN

## 2016-06-28 MED ORDER — THIAMINE HCL 100 MG/ML IJ SOLN: 100.0000 mg | Freq: Every day | INTRAMUSCULAR | Status: DC

## 2016-06-28 MED ORDER — AMLODIPINE BESYLATE 10 MG PO TABS
10.0000 mg | ORAL_TABLET | Freq: Every day | ORAL | Status: DC
  Administered 2016-06-28 – 2016-07-01 (×4): 10 mg via ORAL
  Filled 2016-06-28 (×5): qty 1

## 2016-06-28 MED ORDER — ACETAMINOPHEN 160 MG/5ML PO SOLN: 650.0000 mg | ORAL | Status: DC | PRN

## 2016-06-28 NOTE — ED Notes (Signed)
Attempted report 

## 2016-06-28 NOTE — ED Notes (Signed)
Patient transported to MRI 

## 2016-06-28 NOTE — ED Notes (Signed)
Pt. Ambulatory with stand-by assist to restroom at this time. Pt. Walked with steady gait.

## 2016-06-28 NOTE — ED Notes (Signed)
Pt speaking on the phone to a family member at this time.

## 2016-06-28 NOTE — Progress Notes (Signed)
Patient's blood pressure was 222/82. Switched arms and rechecked and blood pressure was still 218/85 (122). Md made aware and orders anticipated.

## 2016-06-28 NOTE — Progress Notes (Signed)
Family Medicine Teaching Service Daily Progress Note Intern Pager: [phone]  Patient name: Deborah Wise Medical record number: [id number] Date of birth: [birth year] Age: 66 y.o. Gender: female  Primary Care Provider: Pcp Not In System Consultants: Neurology Code Status: Full  Pt Overview and Major Events to Date:  1/21 Admitted for stroke  Assessment and Plan: Deborah Wise is a 66 y.o. female presenting with right sided weakness. PMH is significant for hypertension, diabetes, hepatitis C, liver cirrhosis, alcohol use disorder and thrombocytopenia  Acute CVA, L pontine infarct. Presented with aphasia, R facial droop, pronator drift and multiple falls 2/2 R hemiparesis over past 3 days. CT and CTA showed left paramedian pons acute infarct. CTA also showed atheromatous stenoses about the carotid bifurcations bilaterally, 80% on R and 40% on L. Will defer to neurology about VVS involvement for carotid stenosis. 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 MRI/MRA showed L paramedian pontine acute infarction with intracranial atherosclerosis with multiple areas of mild stenosis most pronounced in proximal PCA and left proximal A1 segments. Was not a candidate for tPA given presented outside window. Patient's risk factors for CVA are diabetes, hypertension and age. Patient was not compliant with antihypertensive medication and diabetic medications.  -Neurology following, appreciate recommendations             -DAPT: Plavix 75 mg and aspirin 325 mg daily             -Echo pending, carotid dopplers pending -Risk stratification labs: LDL 128, TSH 3.314, A1c pending)             -PT/OT/SLP -EKG shows no acute ST changes and troponin <0.03 -Neurovascular check q2hrs -Cardiac monitor -Permissive hypertension -Atorvastatin  -Fall precautions  Hypertension: BP 175/70 this am Not compliant with home amlodipine and lisinopril -Permissive hypertension for  now  H/o diabetes: Last A1c 4.9 in 2015. Not taking home metformin.  -Repeat A1c pending -SSI  Substance use disorder. Alcohol and cocaine. Reports drinking as much alcohol as she can get, last drink was afternoon of admission. Alcohol level 134 in ED. Exam with ataxia. UDS positive for cocaine -CIWA protocol  Hepatitis C/liver cirrhosis: last viral load 149K in 12/2014. Not immune to hep B. No sign of ascites. She was followed Zamor Lynn Ito, MD at [facility]. She was at week 5 of 12 on Harvoni as of 04/20/2016.  -Will touch base with patient's GI specialist today  FEN/GI:  -Heart healthy. Passed bedside swallow -Saline lock  Prophylaxis: lovenox  Disposition: pending medical management  Subjective:  Feels tired and sleepy this am. Has no other complaints. States has no symptoms of alcohol withdrawal.  Objective: Temp:  [97.9 F (36.6 C)] 97.9 F (36.6 C) (01/21 2046) Pulse Rate:  [78-88] 78 (01/22 0644) Resp:  [15-22] 20 (01/22 0644) BP: (162-195)/(63-85) 175/70 (01/22 0644) SpO2:  [98 %-100 %] 99 % (01/22 0644) Weight:  [74 kg (163 lb 2.3 oz)] 74 kg (163 lb 2.3 oz) (01/21 2045) Physical Exam: General: lying in bed comfortably, no apparent distress. Cardiovascular: RRR, nl s1 & s2, no murmurs, no edema Respiratory: CTAB, normal effort on room air Abdomen: BS present & normal, soft, NTND, no guarding, no rebound, no mass Extremities: warm and well perfused Neuro: alert and oriented. R facial droop/decreased creasing. Strength 4/5 in R UE and LE. Sensation intact throughout. Dysarthria.   Laboratory:  Recent Labs Lab 06/27/16 2011 06/27/16 2017 06/28/16 0724  WBC 7.3  --  8.0  HGB 10.9* 11.9* 10.2*  HCT 33.1* 35.0* 31.9*  PLT 100*  --  100*    Recent Labs Lab 06/27/16 2011 06/27/16 2017 06/28/16 0724  NA 137 142  --   K 3.4* 3.4*  --   CL 106 107  --   CO2 18*  --   --   BUN 11 15  --   CREATININE 0.78 1.00 0.67  CALCIUM 9.5  --    --   PROT 7.9  --   --   BILITOT 0.8  --   --   ALKPHOS 104  --   --   ALT 37  --   --   AST 56*  --   --   GLUCOSE 114* 117*  --    Mag 1.7 Phos 3.9  Lipid Panel     Component Value Date/Time   CHOL 190 06/28/2016 0449   TRIG 70 06/28/2016 0449   HDL 48 06/28/2016 0449   CHOLHDL 4.0 06/28/2016 0449   VLDL 14 06/28/2016 0449   LDLCALC 128 (H) 06/28/2016 0449   TSH 3.314  Urinalysis    Component Value Date/Time   COLORURINE YELLOW 06/27/2016 2152   APPEARANCEUR HAZY (A) 06/27/2016 2152   LABSPEC 1.035 (H) 06/27/2016 2152   PHURINE 6.0 06/27/2016 2152   GLUCOSEU NEGATIVE 06/27/2016 2152   HGBUR SMALL (A) 06/27/2016 2152   BILIRUBINUR NEGATIVE 06/27/2016 2152   KETONESUR 5 (A) 06/27/2016 2152   PROTEINUR 100 (A) 06/27/2016 2152   UROBILINOGEN >8.0 (H) 02/12/2015 1340   NITRITE POSITIVE (A) 06/27/2016 2152   LEUKOCYTESUR TRACE (A) 06/27/2016 2152     Imaging/Diagnostic Tests: Ct Angio Head W Or Wo Contrast  Result Date: 06/27/2016 CLINICAL DATA:  Initial evaluation for acute right-sided weakness. EXAM: CT ANGIOGRAPHY HEAD AND NECK TECHNIQUE: Multidetector CT imaging of the head and neck was performed using the standard protocol during bolus administration of intravenous contrast. Multiplanar CT image reconstructions and MIPs were obtained to evaluate the vascular anatomy. Carotid stenosis measurements (when applicable) are obtained utilizing NASCET criteria, using the distal internal carotid diameter as the denominator. CONTRAST:  50 cc of Isovue 370. COMPARISON:  Prior head CT from earlier the same day. FINDINGS: CTA NECK FINDINGS Aortic arch: Visualized aortic arch of normal caliber with normal 3 vessel morphology. Focal plaque at the origin of the left subclavian artery without significant stenosis. No high-grade stenosis at the origin of the great vessels. Visualized subclavian arteries widely patent. Right carotid system: Right common carotid artery widely patent from its  origin to the bifurcation. Calcified and noncalcified plaque about the right bifurcation/ proximal right ICA. Associated severe short-segment stenosis of approximately 80% by NASCET criteria (series 7, image 204). Area affected begins at the bifurcation and measures approximately 13 mm in length. Distally, right ICA patent to the skullbase without stenosis, dissection, or occlusion. No made of fairly severe atheromatous stenosis at the origin of the right external carotid artery is well. Left carotid system: Left common carotid artery patent from its origin to the bifurcation without flow-limiting stenosis. Scattered eccentric calcified plaque noted within the distal left common carotid artery. Calcified plaque at the left bifurcation/proximal left ICA. Associated short-segment stenosis of approximately 40% by NASCET criteria. Distally, left ICA patent to the skullbase without stenosis, dissection, or occlusion. Calcified plaque at the origin of the left external carotid artery without high-grade stenosis. Vertebral arteries: Both of the vertebral arteries arise from the subclavian arteries. Left vertebral artery dominant. Right vertebral  artery diffusely hypoplastic. Focal plaque near the origin of the left vertebral artery without flow-limiting stenosis. Vertebral arteries patent within the neck without stenosis, dissection, or occlusion. Skeleton: No acute osseous abnormality. No worrisome lytic or blastic osseous lesions. Moderate degenerative spondylolysis present at C5-6 and C6-7. Other neck: Soft tissues of the neck demonstrate no acute abnormality. No adenopathy. Upper chest: Visualized upper mediastinum within normal limits. Visualized lungs are clear. Review of the MIP images confirms the above findings CTA HEAD FINDINGS Anterior circulation: Petrous segments widely patent. Scattered atheromatous plaque throughout the cavernous/ supraclinoid ICAs with mild to moderate diffuse narrowing. Right A1 segment  patent. Moderate narrowing at the origin of the left A1 segment. Anterior communicating artery patent. Anterior cerebral arteries demonstrate multifocal atheromatous irregularity. There are superimposed severe stenoses (series 7, image 83 on the right, in 01/1983 on the left). PCAs are patent to their distal aspects. M1 segments demonstrate mild atheromatous irregularity without flow-limiting stenosis or occlusion. There is a saccular aneurysm measuring 4 x 3 x 4 mm arising from the left MCA bifurcation (series 7, image 110). No proximal M2 occlusion. Multifocal atheromatous irregularity throughout the MCA branches bilaterally. Posterior circulation: Dominant left vertebral artery widely patent to the vertebrobasilar junction. Diminutive right vertebral artery patent as well. Posterior inferior cerebral arteries patent bilaterally. Basilar artery demonstrates mild multifocal atheromatous irregularity without flow-limiting stenosis. Superior cerebral arteries patent bilaterally. PCAs primarily supplied via the basilar artery. Multifocal atheromatous regularity within the left P1 segment with moderate stenoses. Additional atheromatous irregularity within the PCAs distally. There are associated multifocal moderate to severe stenoses within the right P2 segment, which is somewhat attenuated as compared to the left. PCAs are supplied to their distal aspects. Venous sinuses: Patent. Anatomic variants: No significant anatomic variant. Delayed phase: Not performed. Review of the MIP images confirms the above findings IMPRESSION: 1. Negative CTA for emergent large vessel occlusion. Evolving left pontine infarct stable from previous CT. 2. 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 3. Moderate atheromatous disease involving the anterior and posterior circulations as above, most notable within the distal ACAs and right PCA. 4. 4 x 3 x 4 mm left MCA  bifurcation aneurysm. Critical Value/emergent results were called by telephone at the time of interpretation on 06/27/2016 at 9:07 pm to Dr. Roland Rack , who verbally acknowledged these results. Electronically Signed   By: Jeannine Boga M.D.   On: 06/27/2016 21:10   Ct Angio Neck W Or Wo Contrast  Result Date: 06/27/2016 CLINICAL DATA:  Initial evaluation for acute right-sided weakness. EXAM: CT ANGIOGRAPHY HEAD AND NECK TECHNIQUE: Multidetector CT imaging of the head and neck was performed using the standard protocol during bolus administration of intravenous contrast. Multiplanar CT image reconstructions and MIPs were obtained to evaluate the vascular anatomy. Carotid stenosis measurements (when applicable) are obtained utilizing NASCET criteria, using the distal internal carotid diameter as the denominator. CONTRAST:  50 cc of Isovue 370. COMPARISON:  Prior head CT from earlier the same day. FINDINGS: CTA NECK FINDINGS Aortic arch: Visualized aortic arch of normal caliber with normal 3 vessel morphology. Focal plaque at the origin of the left subclavian artery without significant stenosis. No high-grade stenosis at the origin of the great vessels. Visualized subclavian arteries widely patent. Right carotid system: Right common carotid artery widely patent from its origin to the bifurcation. Calcified and noncalcified plaque about the right bifurcation/ proximal right ICA. Associated severe short-segment stenosis of  approximately 80% by NASCET criteria (series 7, image 204). Area affected begins at the bifurcation and measures approximately 13 mm in length. Distally, right ICA patent to the skullbase without stenosis, dissection, or occlusion. No made of fairly severe atheromatous stenosis at the origin of the right external carotid artery is well. Left carotid system: Left common carotid artery patent from its origin to the bifurcation without flow-limiting stenosis. Scattered eccentric  calcified plaque noted within the distal left common carotid artery. Calcified plaque at the left bifurcation/proximal left ICA. Associated short-segment stenosis of approximately 40% by NASCET criteria. Distally, left ICA patent to the skullbase without stenosis, dissection, or occlusion. Calcified plaque at the origin of the left external carotid artery without high-grade stenosis. Vertebral arteries: Both of the vertebral arteries arise from the subclavian arteries. Left vertebral artery dominant. Right vertebral artery diffusely hypoplastic. Focal plaque near the origin of the left vertebral artery without flow-limiting stenosis. Vertebral arteries patent within the neck without stenosis, dissection, or occlusion. Skeleton: No acute osseous abnormality. No worrisome lytic or blastic osseous lesions. Moderate degenerative spondylolysis present at C5-6 and C6-7. Other neck: Soft tissues of the neck demonstrate no acute abnormality. No adenopathy. Upper chest: Visualized upper mediastinum within normal limits. Visualized lungs are clear. Review of the MIP images confirms the above findings CTA HEAD FINDINGS Anterior circulation: Petrous segments widely patent. Scattered atheromatous plaque throughout the cavernous/ supraclinoid ICAs with mild to moderate diffuse narrowing. Right A1 segment patent. Moderate narrowing at the origin of the left A1 segment. Anterior communicating artery patent. Anterior cerebral arteries demonstrate multifocal atheromatous irregularity. There are superimposed severe stenoses (series 7, image 83 on the right, in 01/1983 on the left). PCAs are patent to their distal aspects. M1 segments demonstrate mild atheromatous irregularity without flow-limiting stenosis or occlusion. There is a saccular aneurysm measuring 4 x 3 x 4 mm arising from the left MCA bifurcation (series 7, image 110). No proximal M2 occlusion. Multifocal atheromatous irregularity throughout the MCA branches bilaterally.  Posterior circulation: Dominant left vertebral artery widely patent to the vertebrobasilar junction. Diminutive right vertebral artery patent as well. Posterior inferior cerebral arteries patent bilaterally. Basilar artery demonstrates mild multifocal atheromatous irregularity without flow-limiting stenosis. Superior cerebral arteries patent bilaterally. PCAs primarily supplied via the basilar artery. Multifocal atheromatous regularity within the left P1 segment with moderate stenoses. Additional atheromatous irregularity within the PCAs distally. There are associated multifocal moderate to severe stenoses within the right P2 segment, which is somewhat attenuated as compared to the left. PCAs are supplied to their distal aspects. Venous sinuses: Patent. Anatomic variants: No significant anatomic variant. Delayed phase: Not performed. Review of the MIP images confirms the above findings IMPRESSION: 1. Negative CTA for emergent large vessel occlusion. Evolving left pontine infarct stable from previous CT. 2. 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 3. Moderate atheromatous disease involving the anterior and posterior circulations as above, most notable within the distal ACAs and right PCA. 4. 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 Critical Value/emergent results were called by telephone at the time of interpretation on 06/27/2016 at 9:07 pm to Dr. Roland Rack , who verbally acknowledged these results. Electronically Signed   By: Jeannine Boga M.D.   On: 06/27/2016 21:10   Mr Jodene Nam Head Wo Contrast  Result Date: 06/28/2016 CLINICAL DATA:  66 y/o F; status post fall, right hemi paresis, a aphasia. EXAM: MRI HEAD WITHOUT CONTRAST MRA HEAD  WITHOUT CONTRAST TECHNIQUE: Multiplanar, multiecho pulse sequences of the brain and surrounding structures were obtained without intravenous contrast. Angiographic images of the head were  obtained using MRA technique without contrast. COMPARISON:  CT head and CT angiogram head and neck dated 06/27/2016. FINDINGS: MRI HEAD FINDINGS Brain: Left paramedian pons focus of acute diffusion restriction similar in distribution to hypodensity on CT compatible with acute infarction. Few foci of T2 FLAIR hyperintensity in subcortical and periventricular white matter compatible with mild chronic microvascular ischemic changes. Mild diffuse brain parenchymal volume loss. Small chronic lacunar infarct within left caudate body. Single punctate focus of susceptibility hypointensity within the left temporal lobe is compatible with hemosiderin deposition of old microhemorrhage. No focal mass effect, extra-axial collection, or hydrocephalus. Vascular: As below. Skull and upper cervical spine: Normal marrow signal. Sinuses/Orbits: Negative. Other: None. MRA HEAD FINDINGS Internal carotid arteries: Prominent infundibular origin of right anterior choroidal artery. Left MCA bifurcation 3 x 4 mm aneurysm as seen on prior CT angiogram (series 6, image 101). Anterior cerebral arteries:  Patent. Middle cerebral arteries: Patent. Anterior communicating artery: Patent. Posterior communicating arteries:  Patent. Posterior cerebral arteries:  Patent. Basilar artery:  Patent. Vertebral arteries:  Patent. No large vessel occlusion or high-grade stenosis. Intracranial atherosclerosis with multiple segments of mild stenosis in the anterior and posterior circulation most pronounced and proximal PCA and left proximal A1. IMPRESSION: 1. Left paramedian pontine acute infarction is stable in distribution comparing with prior CT given differences in technique. No hemorrhage. 2. Background of mild chronic microvascular ischemic changes and parenchymal volume loss of the brain. 3. No large vessel occlusion or high-grade stenosis of the circle of Willis. 4. Intracranial atherosclerosis with multiple areas of mild stenosis most pronounced in  proximal PCA and left proximal A1 segments. 5. 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   On: 06/28/2016 04:38   Mr Brain Wo Contrast  Result Date: 06/28/2016 CLINICAL DATA:  66 y/o F; status post fall, right hemi paresis, a aphasia. EXAM: MRI HEAD WITHOUT CONTRAST MRA HEAD WITHOUT CONTRAST TECHNIQUE: Multiplanar, multiecho pulse sequences of the brain and surrounding structures were obtained without intravenous contrast. Angiographic images of the head were obtained using MRA technique without contrast. COMPARISON:  CT head and CT angiogram head and neck dated 06/27/2016. FINDINGS: MRI HEAD FINDINGS Brain: Left paramedian pons focus of acute diffusion restriction similar in distribution to hypodensity on CT compatible with acute infarction. Few foci of T2 FLAIR hyperintensity in subcortical and periventricular white matter compatible with mild chronic microvascular ischemic changes. Mild diffuse brain parenchymal volume loss. Small chronic lacunar infarct within left caudate body. Single punctate focus of susceptibility hypointensity within the left temporal lobe is compatible with hemosiderin deposition of old microhemorrhage. No focal mass effect, extra-axial collection, or hydrocephalus. Vascular: As below. Skull and upper cervical spine: Normal marrow signal. Sinuses/Orbits: Negative. Other: None. MRA HEAD FINDINGS Internal carotid arteries: Prominent infundibular origin of right anterior choroidal artery. Left MCA bifurcation 3 x 4 mm aneurysm as seen on prior CT angiogram (series 6, image 101). Anterior cerebral arteries:  Patent. Middle cerebral arteries: Patent. Anterior communicating artery: Patent. Posterior communicating arteries:  Patent. Posterior cerebral arteries:  Patent. Basilar artery:  Patent. Vertebral arteries:  Patent. No large vessel occlusion or high-grade stenosis. Intracranial atherosclerosis with multiple segments of mild  stenosis in the anterior and posterior circulation most pronounced and proximal PCA and left proximal A1. IMPRESSION: 1. Left paramedian pontine acute infarction is  stable in distribution comparing with prior CT given differences in technique. No hemorrhage. 2. Background of mild chronic microvascular ischemic changes and parenchymal volume loss of the brain. 3. No large vessel occlusion or high-grade stenosis of the circle of Willis. 4. Intracranial atherosclerosis with multiple areas of mild stenosis most pronounced in proximal PCA and left proximal A1 segments. 5. 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   On: 06/28/2016 04:38   Ct Head Code Stroke W/o Cm  Result Date: 06/27/2016 CLINICAL DATA:  Code stroke. Initial evaluation for acute right-sided weakness. EXAM: CT HEAD WITHOUT CONTRAST TECHNIQUE: Contiguous axial images were obtained from the base of the skull through the vertex without intravenous contrast. COMPARISON:  None. FINDINGS: Brain: Age-related cerebral atrophy with mild chronic small vessel ischemic disease. No acute intracranial hemorrhage. There is a subtle hypodensity measuring approximately 16 mm within the left paramedian pons, suspicious for evolving acute ischemic infarct (series 2, image 8). No associated mass effect. No other evidence for acute large vessel territory infarct. Gray-white matter differentiation otherwise maintained. No mass lesion, midline shift, or mass effect. No hydrocephalus. No extra-axial fluid collection. Vascular: No hyperdense vessel. Scattered vascular calcifications present within the carotid siphons. Skull: Scalp soft tissues within normal limits.  Calvarium intact. Sinuses/Orbits: Globes and orbital soft tissues within normal limits. Scattered mucosal thickening within the ethmoidal air cells. Paranasal sinuses are otherwise clear. No mastoid effusion. ASPECTS [facility] Stroke Program Early CT  Score) - Ganglionic level infarction (caudate, lentiform nuclei, internal capsule, insula, M1-M3 cortex): 7 - Supraganglionic infarction (M4-M6 cortex): 3 Total score (0-10 with 10 being normal): 10 IMPRESSION: 1. Focal hypodensity within the left paramedian pons, suspicious for evolving acute ischemic infarct. 2. ASPECTS is 10 3. Age-related cerebral atrophy with mild chronic small vessel ischemic disease. Critical Value/emergent results were called by telephone at the time of interpretation on 06/27/2016 at 8:27 pm to Dr. Kathrynn Speed, who verbally acknowledged these results. Electronically Signed   By: Jeannine Boga M.D.   On: 06/27/2016 20:33    Bufford Lope, DO 06/28/2016, 7:01 AM PGY-1, [facility] Intern pager: [phone], text pages welcome

## 2016-06-29 ENCOUNTER — Inpatient Hospital Stay (HOSPITAL_COMMUNITY): Payer: Medicaid Other

## 2016-06-29 ENCOUNTER — Encounter (HOSPITAL_COMMUNITY): Payer: Self-pay | Admitting: General Surgery

## 2016-06-29 DIAGNOSIS — I63012 Cerebral infarction due to thrombosis of left vertebral artery: Secondary | ICD-10-CM

## 2016-06-29 DIAGNOSIS — I6521 Occlusion and stenosis of right carotid artery: Secondary | ICD-10-CM

## 2016-06-29 DIAGNOSIS — I6350 Cerebral infarction due to unspecified occlusion or stenosis of unspecified cerebral artery: Secondary | ICD-10-CM

## 2016-06-29 DIAGNOSIS — I639 Cerebral infarction, unspecified: Principal | ICD-10-CM

## 2016-06-29 LAB — BASIC METABOLIC PANEL
Anion gap: 6 (ref 5–15)
BUN: 14 mg/dL (ref 6–20)
CO2: 25 mmol/L (ref 22–32)
Calcium: 9.3 mg/dL (ref 8.9–10.3)
Chloride: 106 mmol/L (ref 101–111)
Creatinine, Ser: 0.75 mg/dL (ref 0.44–1.00)
GFR calc Af Amer: >60 mL/min (ref >60)
GFR calc non Af Amer: >60 mL/min (ref >60)
Glucose, Bld: 119 mg/dL — ABNORMAL HIGH (ref 65–99)
Potassium: 3.3 mmol/L — ABNORMAL LOW (ref 3.5–5.1)
Sodium: 137 mmol/L (ref 135–145)

## 2016-06-29 LAB — CBC
HCT: 30.9 % — ABNORMAL LOW (ref 36.0–46.0)
Hemoglobin: 10 g/dL — ABNORMAL LOW (ref 12.0–15.0)
MCH: 24.6 pg — ABNORMAL LOW (ref 26.0–34.0)
MCHC: 32.4 g/dL (ref 30.0–36.0)
MCV: 76.1 fL — ABNORMAL LOW (ref 78.0–100.0)
Platelets: 95 10*3/uL — ABNORMAL LOW (ref 150–400)
RBC: 4.06 MIL/uL (ref 3.87–5.11)
RDW: 16.4 % — ABNORMAL HIGH (ref 11.5–15.5)
WBC: 6.2 10*3/uL (ref 4.0–10.5)

## 2016-06-29 LAB — HEMOGLOBIN A1C
Hgb A1c MFr Bld: 5.4 % (ref 4.8–5.6)
Mean Plasma Glucose: 108 mg/dL

## 2016-06-29 LAB — GLUCOSE, CAPILLARY: Glucose-Capillary: 189 mg/dL — ABNORMAL HIGH (ref 65–99)

## 2016-06-29 MED ORDER — ATORVASTATIN CALCIUM 80 MG PO TABS
80.0000 mg | ORAL_TABLET | Freq: Every day | ORAL | Status: DC
  Administered 2016-06-29 – 2016-07-01 (×3): 80 mg via ORAL
  Filled 2016-06-29 (×3): qty 1

## 2016-06-29 MED ORDER — POTASSIUM CHLORIDE CRYS ER 20 MEQ PO TBCR
40.0000 meq | EXTENDED_RELEASE_TABLET | Freq: Two times a day (BID) | ORAL | Status: AC
Start: 2016-06-29 — End: 2016-06-29
  Administered 2016-06-29 (×2): 40 meq via ORAL
  Filled 2016-06-29 (×2): qty 2

## 2016-06-29 NOTE — Evaluation (Signed)
Physical Therapy Evaluation Patient Details Name: Deborah Wise MRN: [id number] DOB: Apr 21, 1951 Today's Date: 06/29/2016   History of Present Illness  66 y.o. female presenting with right sided weakness. PMH is significant for hypertension, diabetes, hepatitis C, liver cirrhosis, alcohol use disorder and thrombocytopenia. Imaging revealed Acute CVA, L pontine infarct.  Clinical Impression  Patient seen for evaluation s/p Stroke. At this time, patient demonstrates deficits in strength right side, some coordination deficits, and instability with higher level mobility tasks. 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 She states that she helps her dad with transfers and that she was assisting with meal preparation and other daily activities. Since the stroke, patient reports multiple falls at home and difficulty with some general functional tasks. Will recommend further therapies upon acute discharge.     Follow Up Recommendations CIR    Equipment Recommendations  None recommended by PT    Recommendations for Other Services Rehab consult     Precautions / Restrictions Precautions Precautions: Fall      Mobility  Bed Mobility               General bed mobility comments: received OOB  Transfers Overall transfer level: Needs assistance Equipment used: None Transfers: Sit to/from Stand Sit to Stand: Min guard         General transfer comment: min guard assist for stability in elevation to standing  Ambulation/Gait Ambulation/Gait assistance: Min assist Ambulation Distance (Feet): 90 Feet Assistive device: 1 person hand held assist Gait Pattern/deviations: Step-through pattern;Decreased stride length;Decreased stance time - right;Narrow base of support Gait velocity: decreased Gait velocity interpretation: Below normal speed for age/gender General Gait Details: patient with some instability during  ambulation, ocassional right leg lag noted with increased instability. Multiple balance checks with min assist for stability  Stairs            Wheelchair Mobility    Modified Rankin (Stroke Patients Only)       Balance Overall balance assessment: Needs assistance;History of Falls   Sitting balance-Leahy Scale: Good     Standing balance support: During functional activity     Single Leg Stance - Right Leg:  (can not perform without UE support) Single Leg Stance - Left Leg: 10                         Pertinent Vitals/Pain Pain Assessment: No/denies pain    Home Living Family/patient expects to be discharged to:: Private residence Living Arrangements: Alone Available Help at Discharge: Family;Available PRN/intermittently Type of Home: Apartment Home Access: Stairs to enter Entrance Stairs-Rails: Can reach both Entrance Stairs-Number of Steps: flight Home Layout: One level Home Equipment: Cane - single point      Prior Function Level of Independence: Independent         Comments: 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      Hand Dominance   Dominant Hand: Right    Extremity/Trunk Assessment   Upper Extremity Assessment Upper Extremity Assessment: Defer to OT evaluation;RUE deficits/detail    Lower Extremity Assessment Lower Extremity Assessment: RLE deficits/detail RLE Deficits / Details: 4/5 gross motions  RLE Coordination: decreased fine motor;decreased gross motor       Communication   Communication:  (dysarthria present)  Cognition Arousal/Alertness: Awake/alert Behavior During Therapy: Impulsive Overall Cognitive Status: Impaired/Different from baseline  General Comments      Exercises     Assessment/Plan    PT Assessment Patient needs continued PT services  PT Problem List Decreased strength;Decreased activity tolerance;Decreased balance;Decreased mobility;Decreased  coordination;Decreased safety awareness          PT Treatment Interventions DME instruction;Gait training;Stair training;Functional mobility training;Therapeutic activities;Therapeutic exercise;Balance training;Neuromuscular re-education;Patient/family education    PT Goals (Current goals can be found in the Care Plan section)  Acute Rehab PT Goals Patient Stated Goal: to get back to normal PT Goal Formulation: With patient Time For Goal Achievement: 07/13/16 Potential to Achieve Goals: Good    Frequency Min 4X/week   Barriers to discharge        Co-evaluation PT/OT/SLP Co-Evaluation/Treatment:  (dovetailed upon end of OT session)             End of Session Equipment Utilized During Treatment: Gait belt Activity Tolerance: Patient tolerated treatment well Patient left: in chair;with call bell/phone within reach;with chair alarm set Nurse Communication: Mobility status         Time: 1020-1034 PT Time Calculation (min) (ACUTE ONLY): 14 min   Charges:   PT Evaluation $PT Eval Moderate Complexity: 1 Procedure     PT G Codes:        Duncan Dull 07/03/2016, 10:50 AM Alben Deeds, PT DPT  [phone]

## 2016-06-29 NOTE — Progress Notes (Addendum)
STROKE TEAM PROGRESS NOTE   HISTORY OF PRESENT ILLNESS (per record) Deborah Wise is a 66 y.o. female the history of diabetes, hypertension who presents with unsteadiness which started 2 days ago, but worsened this evening. Earlier today, she was able to walk but was falling frequently, then after she got home around 5 PM, she was unable to walk and therefore she was brought to the ER. MS Contin could stroke on route, but her last level was actually 2 days ago and therefore she is not a candidate for IV TPA.  Given her worsening, on CT head was performed which does not demonstrate any large vessel occlusion.   LKW: 2 days ago tpa given?: no, out of window   SUBJECTIVE (INTERVAL HISTORY) Dr. Leonie Man discussed the patient's stroke as well as her carotid stenosis and cerebral aneurysm. He explained to the patient that she must be very careful with her blood pressure due to her aneurysm. He also explained that she must stop using cocaine as this could cause the aneurysm to rupture. He recommended a cerebral angiogram for further evaluation.    OBJECTIVE Temp:  [97.5 F (36.4 C)-98.9 F (37.2 C)] 97.5 F (36.4 C) (01/23 1053) Pulse Rate:  [66-72] 68 (01/23 1053) Cardiac Rhythm: Normal sinus rhythm (01/23 0700) Resp:  [16-21] 20 (01/23 1053) BP: (151-211)/(57-96) 167/72 (01/23 1053) SpO2:  [98 %-100 %] 100 % (01/23 1053)  CBC:  Recent Labs Lab 06/27/16 2011  06/28/16 0724 06/29/16 0256  WBC 7.3  --  8.0 6.2  NEUTROABS 4.2  --   --   --   HGB 10.9*  < > 10.2* 10.0*  HCT 33.1*  < > 31.9* 30.9*  MCV 76.3*  --  75.8* 76.1*  PLT 100*  --  100* 95*  < > = values in this interval not displayed.  Basic Metabolic Panel:  Recent Labs Lab 06/27/16 2011 06/27/16 2017 06/28/16 0449 06/28/16 0724 06/29/16 0256  NA 137 142  --   --  137  K 3.4* 3.4*  --   --  3.3*  CL 106 107  --   --  106  CO2 18*  --   --   --  25  GLUCOSE 114* 117*  --   --  119*  BUN 11 15  --   --  14   CREATININE 0.78 1.00  --  0.67 0.75  CALCIUM 9.5  --   --   --  9.3  MG  --   --  1.7  --   --   PHOS  --   --  3.9  --   --     Lipid Panel:    Component Value Date/Time   CHOL 190 06/28/2016 0449   TRIG 70 06/28/2016 0449   HDL 48 06/28/2016 0449   CHOLHDL 4.0 06/28/2016 0449   VLDL 14 06/28/2016 0449   LDLCALC 128 (H) 06/28/2016 0449   HgbA1c:  Lab Results  Component Value Date   HGBA1C 5.4 06/28/2016   Urine Drug Screen:    Component Value Date/Time   LABOPIA NONE DETECTED 06/28/2016 0443   COCAINSCRNUR POSITIVE (A) 06/28/2016 0443   LABBENZ NONE DETECTED 06/28/2016 0443   AMPHETMU NONE DETECTED 06/28/2016 0443   THCU NONE DETECTED 06/28/2016 0443   LABBARB NONE DETECTED 06/28/2016 0443      IMAGING  Ct Angio Head and Neck W Or Wo Contrast 06/27/2016 1. Negative CTA for emergent large vessel occlusion. Evolving left pontine infarct stable  from previous CT.  2. 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  3. Moderate atheromatous disease involving the anterior and posterior circulations as above, most notable within the distal ACAs and right PCA. 4. 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     Mr Jodene Nam Head Wo Contrast 06/28/2016 1. Left paramedian pontine acute infarction is stable in distribution comparing with prior CT given differences in technique. No hemorrhage.  2. Background of mild chronic microvascular ischemic changes and parenchymal volume loss of the brain.  3. No large vessel occlusion or high-grade stenosis of the circle of Willis.  4. Intracranial atherosclerosis with multiple areas of mild stenosis most pronounced in proximal PCA and left proximal A1 segments.  5. 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 Focal hypodensity within the left paramedian pons, suspicious for evolving acute ischemic infarct.  2. ASPECTS is 10   3. Age-related cerebral atrophy with mild chronic small vessel ischemic disease.     PHYSICAL EXAM Obese middle aged african american lady not in distress. . Afebrile. Head is nontraumatic. Neck is supple without bruit.    Cardiac exam no murmur or gallop. Lungs are clear to auscultation. Distal pulses are well felt. Neurological Exam :   Awake alert oriented x 3 normal speech and language. Mild right lower face asymmetry. Tongue midline. No drift. Mild diminished fine finger movements on right. Orbits left over right upper extremity. Mild right grip weak.. Normal sensation . Normal coordination.Gait deferred       ASSESSMENT/PLAN [name] is a 66 y.o. female with history of hypertension, hepatitis, thrombocytopenia, diabetes mellitus, and substance abuse presenting with unsteadiness and left hemiparesthesias. She did not receive IV t-PA due to late presentation.  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 Left 1-39% ICA stenosis.  2D Echo - EF 60-65%.  LDL - 128  HgbA1c - 5.4  VTE prophylaxis - Lovenox  Diet Heart Room service appropriate? Yes; Fluid consistency: Thin  No antithrombotic prior to admission, now on aspirin 325 mg daily  Patient counseled to be compliant with her antithrombotic medications  Ongoing aggressive stroke risk factor management  Therapy recommendations:  CIR recommended  Disposition:  Pending  Hypertension  Blood pressure tends to run high  In consideration of the patient's cerebral aneurysm would try to normalize blood pressure at this point.  Long-term BP goal normotensive  Hyperlipidemia  Home meds:  No lipid lowering medications prior to admission  LDL 128, goal < 70  Now on atorvastatin 80 mg daily  Continue statin  at discharge  Diabetes  HgbA1c 5.4, goal < 7.0  Controlled  Other Stroke Risk Factors  Advanced age  ETOH use, advised to abstain from alcohol.  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 Leonie Man - discontinue Plavix in the setting of a cerebral aneurysm.  Recommend cerebral angiogram  and consult Dr. Estanislado Pandy for aneurysm.  Start to normalize blood pressure.  Hospital day # 2  Mikey Bussing PA-C [facility] Pager [phone] 06/29/2016, 2:40 PM I have personally examined this patient, reviewed notes, independently viewed imaging studies, participated in medical decision making and plan of care.ROS completed by me personally and pertinent positives fully documented  I have made any additions or clarifications directly to the above note. Agree with note above. Complex situation. Patient presented with left paramedian pontine infarct from small vessel disease and has mild right hemiparesis but also has asymptomatic extracranial carotid stenosis as well as 4 mm left MCA bifurcation aneurysm. Recommend check diagnostic cerebral catheter angiogram to further a evaluate carotid stenosis as well as left MCA aneurysm and then plan definitive treatment. Agree with plan for outpatient vascular surgery for carotid stenosis and she may need outpatient follow-up with neuroradiology for MCA aneurysm treatment discussion. Patient counseled to quit cocaine and smoking and recommend aspirin for stroke prevention and aggressive risk factor modification. Greater than 50% time during this 35 minute visit was spent on counseling and coordination of care.stroke risk, evaluation, and pain aneurysm and  carotid stenosis treatment option discussion  Antony Contras, MD Medical Director [facility] Pager: [phone] 06/30/2016 1:49 PM   To contact Stroke Continuity provider, please refer to [email or web address] After hours, contact General Neurology

## 2016-06-29 NOTE — Progress Notes (Signed)
Family Medicine Teaching Service Daily Progress Note Intern Pager: [phone]  Patient name: Deborah Wise Medical record number: [id number] Date of birth: 21-Oct-1950 Age: 66 y.o. Gender: female  Primary Care Provider: Pcp Not In System Consultants: Neurology Code Status: Full  Pt Overview and Major Events to Date:  1/21 Admitted for stroke  Assessment and Plan: Deborah Wise is a 66 y.o. female presenting with right sided weakness. PMH is significant for hypertension, diabetes, hepatitis C, liver cirrhosis, alcohol use disorder and thrombocytopenia  Acute CVA, L pontine infarct, improving Presented with aphasia, R facial droop, pronator drift and multiple falls 2/2 R hemiparesis over past 3 days. CT and CTA showed left paramedian pons acute infarct. CTA also showed atheromatous stenoses about the carotid bifurcations bilaterally, 80% on R and 40% on L. Will defer to neurology about VVS involvement for carotid stenosis. 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 MRI/MRA showed L paramedian pontine acute infarction with intracranial atherosclerosis with multiple areas of mild stenosis most pronounced in proximal PCA and left proximal A1 segments. Was not a candidate for tPA given presented outside window. Patient's risk factors for CVA are diabetes, hypertension and age. Patient was not compliant with antihypertensive medication and diabetic medications.  -Neurology stroke team following, appreciate recommendations             -DAPT: Plavix 75 mg and aspirin 325 mg daily             -Echo EF 60-65%, G1DD  -carotid dopplers prelim: Right 60-79% ICA stenosis. Left 1-39% ICA stenosis. -Risk stratification labs: LDL 128, TSH 3.314, A1c 5.5             -PT/OT recommended CIR -EKG shows no acute ST changes and troponin <0.03 -Neurovascular check q2hrs -Cardiac monitor -Permissive hypertension -Atorvastatin 80mg  qd -Fall precautions  -awaiting CIR  evaluation -will consult VVS for R carotid stenosis per neuro stroke team.  Hypertension: BP 167/79 this am Not compliant with home amlodipine and lisinopril. Had elevated BP overnight to 211/76, curbside consulted overnight neurology who recommended starting home amlodipine since >24hrs from CVA. -Permissive hypertension for now, BP in appropriate range on amlodipine 10mg  qd  H/o diabetes: Last A1c 4.9 in 2015, repeat a1c 5.5. Not taking home metformin -SSI -Will not restart metformin on DC  Substance use disorder. Alcohol and cocaine. Reports drinking as much alcohol as she can get, last drink was afternoon of admission. Alcohol level 134 in ED. Exam with ataxia. UDS positive for cocaine -CIWA protocol  Hepatitis C/liver cirrhosis: last viral load 149K in 12/2014. Not immune to hep B. No sign of ascites. She was followed Zamor Lynn Ito, MD at [facility]. She was at week 5 of 12 on Harvoni as of 04/20/2016.  -Will touch base with patient's GI specialist today  FEN/GI:  -Heart healthy. -Saline lock  Prophylaxis: lovenox  Disposition: pending medical management  Subjective:  Feels well this morning. Only concern is not enjoying heart healthy diet and wants to know if and when she can resume her usual diet at home. Voiced good understanding that given her acute stroke that a heart healthy diet is a part of recommended lifestyle modifications.  Objective: Temp:  [98 F (36.7 C)-98.9 F (37.2 C)] 98 F (36.7 C) (01/23 0414) Pulse Rate:  [66-88] 72 (01/23 0414) Resp:  [16-22] 16 (01/23 0414) BP: (151-211)/(57-96) 168/63 (01/23 0414) SpO2:  [96 %-100 %] 98 % (01/23 0414) Physical Exam: General: lying  in bed comfortably, no apparent distress. Cardiovascular: RRR, nl s1 & s2, no murmurs, no edema Respiratory: CTAB, normal effort on room air Abdomen: BS present & normal, soft, NTND, no guarding, no rebound, no mass Extremities: warm and well perfused Neuro:  alert and oriented. R facial droop/decreased creasing. Strength 4/5 in R UE and LE. Sensation intact throughout. Clear speech.  Laboratory:  Recent Labs Lab 06/27/16 2011 06/27/16 2017 06/28/16 0724 06/29/16 0256  WBC 7.3  --  8.0 6.2  HGB 10.9* 11.9* 10.2* 10.0*  HCT 33.1* 35.0* 31.9* 30.9*  PLT 100*  --  100* 95*    Recent Labs Lab 06/27/16 2011 06/27/16 2017 06/28/16 0724 06/29/16 0256  NA 137 142  --  137  K 3.4* 3.4*  --  3.3*  CL 106 107  --  106  CO2 18*  --   --  25  BUN 11 15  --  14  CREATININE 0.78 1.00 0.67 0.75  CALCIUM 9.5  --   --  9.3  PROT 7.9  --   --   --   BILITOT 0.8  --   --   --   ALKPHOS 104  --   --   --   ALT 37  --   --   --   AST 56*  --   --   --   GLUCOSE 114* 117*  --  119*   Mag 1.7 Phos 3.9  Lipid Panel     Component Value Date/Time   CHOL 190 06/28/2016 0449   TRIG 70 06/28/2016 0449   HDL 48 06/28/2016 0449   CHOLHDL 4.0 06/28/2016 0449   VLDL 14 06/28/2016 0449   LDLCALC 128 (H) 06/28/2016 0449   TSH 3.314  Urinalysis    Component Value Date/Time   COLORURINE YELLOW 06/27/2016 2152   APPEARANCEUR HAZY (A) 06/27/2016 2152   LABSPEC 1.035 (H) 06/27/2016 2152   PHURINE 6.0 06/27/2016 2152   GLUCOSEU NEGATIVE 06/27/2016 2152   HGBUR SMALL (A) 06/27/2016 2152   BILIRUBINUR NEGATIVE 06/27/2016 2152   KETONESUR 5 (A) 06/27/2016 2152   PROTEINUR 100 (A) 06/27/2016 2152   UROBILINOGEN >8.0 (H) 02/12/2015 1340   NITRITE POSITIVE (A) 06/27/2016 2152   LEUKOCYTESUR TRACE (A) 06/27/2016 2152     Imaging/Diagnostic Tests: No results found.  Bufford Lope, DO 06/29/2016, 7:28 AM PGY-1, [facility] Intern pager: [phone], text pages welcome

## 2016-06-29 NOTE — Progress Notes (Signed)
Rehab Admissions Coordinator Note:  Patient was screened by Cleatrice Burke for appropriateness for an Inpatient Acute Rehab Consult per PT and OT recommendation.   At this time, we are recommending Inpatient Rehab consult.  Cleatrice Burke 06/29/2016, 2:07 PM  I can be reached at [phone].

## 2016-06-29 NOTE — Consult Note (Signed)
[facility]ist [facility]  Patient name: Deborah Wise MRN: [id number] DOB: [birth year] Sex: female  REASON FOR CONSULT: Right ICA stenosis, consult is from Dr. Shawna Orleans (Family Medicine Teaching Service)  HPI: Deborah Wise is a 66 y.o. female, who presents with right sided weakness 4 days ago that worsened on 06/27/16. The patient noted frequent falling and also slurred speech. Her boyfriend is at the bedside and states that he noticed some slurred speech a week ago when on the phone. The patient denies any amaurosis fugax. She states that her right sided weakness is somewhat improving, but continues to have slurred speech.   She denies any prior history of TIA or stroke. She was not on ASA prior to this admission.   She denies any drug use or smoking. Her UDS was positive for cocaine however. She has a history of hepatitis C and cirrhosis. She has past medical history of diabetes on metformin and hypertension that is poorly controlled. She is non-compliant with amlodipine.   Past Medical History:  Diagnosis Date  . Arthritis   . Diabetes mellitus without complication ([place])   . Fibroid, uterine   . GERD (gastroesophageal reflux disease)   . Hepatitis    hep C  . Hypertension   . Thrombocytopenia ([place]) 12/25/2014  . Urinary incontinence    Patient noticed mild and is not currently a significant problem    Family History  Problem Relation Age of Onset  . Diabetes type II Mother   . Hypertension Mother   . Cancer Father     patient does not know what type of cancer    SOCIAL HISTORY: Social History   Social History  . Marital status: Single    Spouse name: N/A  . Number of children: N/A  . Years of education: N/A   Occupational History  . Not on file.   Social History Main Topics  . Smoking status: Never Smoker  . Smokeless tobacco: Never Used  . Alcohol use 0.6 oz/week    1 Cans of beer per week     Comment: 1/2 bottle of wine and some beer a day    . Drug use: No  . Sexual activity: Not Currently   Other Topics Concern  . Not on file   Social History Narrative  . No narrative on file    No Known Allergies  Current Facility-Administered Medications  Medication Dose Route Frequency Provider Last Rate Last Dose  .  stroke: mapping our early stages of recovery book   Does not apply Once Mercy Riding, MD      . acetaminophen (TYLENOL) tablet 650 mg  650 mg Oral Q4H PRN Mercy Riding, MD       Or  . acetaminophen (TYLENOL) solution 650 mg  650 mg Per Tube Q4H PRN Mercy Riding, MD       Or  . acetaminophen (TYLENOL) suppository 650 mg  650 mg Rectal Q4H PRN Mercy Riding, MD      . amLODipine (NORVASC) tablet 10 mg  10 mg Oral Daily Elberta Leatherwood, MD   10 mg at 06/29/16 1050  . aspirin EC tablet 325 mg  325 mg Oral Daily Greta Doom, MD   325 mg at 06/29/16 1050  . atorvastatin (LIPITOR) tablet 80 mg  80 mg Oral q1800 Elsia J Yoo, DO      . enoxaparin (LOVENOX) injection 40 mg  40 mg Subcutaneous Daily Mercy Riding, MD  40 mg at 06/29/16 1052  . folic acid (FOLVITE) tablet 1 mg  1 mg Oral Daily Zenia Resides, MD   1 mg at 06/29/16 1050  . LORazepam (ATIVAN) injection 2-3 mg  2-3 mg Intravenous Q1H PRN Mercy Riding, MD      . senna-docusate (Senokot-S) tablet 1 tablet  1 tablet Oral QHS PRN Mercy Riding, MD      . thiamine (VITAMIN B-1) tablet 100 mg  100 mg Oral Daily Zenia Resides, MD   100 mg at 06/29/16 1050    REVIEW OF SYSTEMS:  [X]  denotes positive finding, [ ]  denotes negative finding Cardiac  Comments:  Chest pain or chest pressure:    Shortness of breath upon exertion:    Short of breath when lying flat:    Irregular heart rhythm:        Vascular    Pain in calf, thigh, or hip brought on by ambulation:    Pain in feet at night that wakes you up from your sleep:     Blood clot in your veins:    Leg swelling:         Pulmonary    Oxygen at home:    Productive cough:     Wheezing:          Neurologic    Sudden weakness in arms or legs:  x   Sudden numbness in arms or legs:     Sudden onset of difficulty speaking or slurred speech: x   Temporary loss of vision in one eye:     Problems with dizziness:         Gastrointestinal    Blood in stool:     Vomited blood:         Genitourinary    Burning when urinating:     Blood in urine:        Psychiatric    Major depression:         Hematologic    Bleeding problems:    Problems with blood clotting too easily:        Skin    Rashes or ulcers:        Constitutional    Fever or chills:      PHYSICAL EXAM: Vitals:   06/29/16 0414 06/29/16 0700 06/29/16 1053 06/29/16 1444  BP: (!) 168/63 (!) 167/79 (!) 167/72 (!) 157/72  Pulse: 72 71 68 73  Resp: 16 18 20  (!) 22  Temp: 98 F (36.7 C) 98.1 F (36.7 C) 97.5 F (36.4 C) 98.4 F (36.9 C)  TempSrc: Oral Oral Oral Oral  SpO2: 98% 100% 100% 99%  Weight:      Height:        GENERAL: The patient is a well-nourished female, in no acute distress. The vital signs are documented above. CARDIAC: There is a regular rate and rhythm. Right carotid bruit.  VASCULAR: 2+ radial and dorsalis pedis pulses bilaterally.  PULMONARY: There is good air exchange bilaterally without wheezing or rales. MUSCULOSKELETAL: There are no major deformities or cyanosis. NEUROLOGIC: Slurred speech. 4/5 right upper and lower extremity strength. 5/5 left upper and lower.  SKIN: There are no ulcers or rashes noted. PSYCHIATRIC: The patient has a normal affect.  DATA:  CTA head/neck 06/27/16  IMPRESSION: 1. Negative CTA for emergent large vessel occlusion. Evolving left pontine infarct stable from previous CT. 2. Atheromatous stenoses about the carotid bifurcations bilaterally, measuring up to 80% on the right and 40% on the  left. 3. Moderate atheromatous disease involving the anterior and posterior circulations as above, most notable within the distal ACAs and right PCA. 4. 4 x 3 x 4 mm  left MCA bifurcation aneurysm. Critical Value/emergent results were called by telephone at the time of interpretation on 06/27/2016 at 9:07 pm to Dr. Roland Rack , who verbally acknowledged these results.  MR Brain 06/28/16  IMPRESSION: 1. Left paramedian pontine acute infarction is stable in distribution comparing with prior CT given differences in technique. No hemorrhage. 2. Background of mild chronic microvascular ischemic changes and parenchymal volume loss of the brain. 3. No large vessel occlusion or high-grade stenosis of the circle of Willis. 4. Intracranial atherosclerosis with multiple areas of mild stenosis most pronounced in proximal PCA and left proximal A1 segments. 5. 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 Her work-up revealed insignificant left carotid stenosis and a tight right carotid stenosis. Her CVA is unrelated to her carotid stenosis. Will allow patient to recover from current CVA and plan for follow-up as an outpatient in 6 weeks. We will discuss elective right carotid endarterectomy at that time. Continue ASA and statin. She will undergo cerebral angiogram tomorrow with IR regarding left MCA aneurysm.     Virgina Jock, PA-C [facility] [place]   I agree with the above.  I have seen and examined the patient.  She has a high grade asymptomatic right ICA stenosis and a acute left brain stroke.  She is scheduled for cerebral angio tomorrow.  She will need outpatient follow up in 4-6 weeks to discuss elective right carotid intervention.  Annamarie Major

## 2016-06-29 NOTE — Care Management Note (Addendum)
Case Management Note  Patient Details  Name: Deborah Wise MRN: [id number] Date of Birth: December 26, 1950  Subjective/Objective:  Patient has hx of psa of cocaine,alcohol , admitted with CVA ,  MRA shows right MCA aneurysm, she is having slurring of speech with right sided weakness , IR consulted for cerebral angiogram to evaluate aneurysm.   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  Per pt eval rec [facility]. NCM will cont to follow for dc needs.     1/25 [place], BSN - NCM spoke with [facility], L[facility], they do not have a bed today for patient, they will check tomorrow.  NCM and MD spoke with patient and she states if they do not have a bed in [facility] would like to go to SNF, this information given to [place], she states patient only has medicaid and that medicaid will not pay for rehab they will only pay room and board patient will have to pay out of pocket for rehab.  Patient states she does not have money to pay out of pocket, NCM informed patient that if she does not go to SNF,she may have to go home with [facility] services which is not much with Medicaid.  NCM gave hand off to new unit NCM.               Action/Plan:   Expected Discharge Date:                  Expected Discharge Plan:  IP Rehab Facility  In-House Referral:  Clinical Social Work  Discharge planning Services  CM Consult  Post Acute Care Choice:    Choice offered to:     DME Arranged:    DME Agency:     HH Arranged:    [place] Agency:     Status of Service:  In process, will continue to follow  If discussed at Long Length of Stay Meetings, dates discussed:    Additional Comments:  Zenon Mayo, RN 06/29/2016, 6:34 PM

## 2016-06-29 NOTE — Consult Note (Signed)
Chief Complaint: right MCA aneurysm  Referring Physician:Dr. Madison Hickman  Supervising Physician: [name]  Patient Status: [facility]  HPI: Deborah Wise is an 66 y.o. female with a history of polysubstance abuse of cocaine, alcohol, and history of hep C with cirrhosis.  She was admitted 2 days ago with an acute CVA.  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  The patient is having slurring of her speech still along with right sided weakness which coincides with her left sided CVA.  We have been asked to see her for a diagnostic cerebral angiogram to further evaluate this aneurysm.  Past Medical History:  Past Medical History:  Diagnosis Date  . Arthritis   . Diabetes mellitus without complication ([place]   . Fibroid, uterine   . GERD (gastroesophageal reflux disease)   . Hepatitis    hep C  . Hypertension   . [place]rombocytopenia ([place] 12/25/2014  . Urinary incontinence    Patient noticed mild and is not currently a significant problem    Past Surgical History:  Past Surgical History:  Procedure Laterality Date  . ANKLE SURGERY    . broken ankle    . MULTIPLE EXTRACTIONS WITH ALVEOLOPLASTY  10/25/2011   Procedure: MULTIPLE EXTRACION WITH ALVEOLOPLASTY;  Surgeon: SGae Bon DDS;  [place] [facility]  Service: Oral Surgery;  Laterality: Bilateral;  Extract teeth numbers one, two, six, seven, eight, nine, ten, eleven, twelve, fifteen, sixteen, eighteen, nineteen, twenty-three, twenty-four, twenty-five, twenty-seven, thirty-two and alveoplasty.    Family History:  Family History  Problem Relation Age of Onset  . Diabetes type II Mother   . Hypertension Mother   . Cancer Father     patient does not know what type of cancer    Social History:  reports that she has never smoked. She has never used smokeless tobacco. She reports that she drinks about 0.6 oz of alcohol per week . She reports that she does not use drugs.  Allergies:  No Known Allergies  Medications: Medications reviewed in epic  Please HPI for pertinent positives, otherwise complete 10 system ROS negative.  Mallampati Score: MD Evaluation Airway: WNL Heart: WNL Abdomen: WNL Chest/ Lungs: WNL ASA  Classification: 3 Mallampati/Airway Score: Two  Physical Exam: BP (!) 157/72 (BP Location: Right Arm)   Pulse 73   Temp 98.4 F (36.9 C) (Oral)   Resp (!) 22   Ht 5' 5"  (1.651 m)   Wt 163 lb 2.3 oz (74 kg)   SpO2 99%   BMI 27.15 kg/m  Body mass index is 27.15 kg/m. General: pleasant, WD, WN black female who is laying in bed in NAD HEENT: head is normocephalic, atraumatic.  Sclera are noninjected.  PERRL.  Ears and nose without any masses or lesions.  Mouth is pink and moist Heart: regular, rate, and rhythm.  Normal s1,s2. No obvious murmurs, gallops, or rubs noted.  Palpable radial and pedal pulses bilaterally Lungs: CTAB, no wheezes, rhonchi, or rales noted.  Respiratory effort nonlabored Abd: soft, NT, ND, +BS, no masses, hernias, or organomegaly Neuro: slurred speech, PEERL, tongue protrudes straight, asymmetric finger-nose-finger, weak grip and finger strength on right side.  RUE with 2-3/5 strength, 5/5 strength in LUE.  Good strength and movement in both LEs. Psych: A&Ox3 with an appropriate affect.   Labs: Results for orders placed or performed during the hospital encounter of 06/27/16 (from the past 48 hour(s))  Ethanol  Status: Abnormal   Collection Time: 06/27/16  8:11 PM  Result Value Ref Range   Alcohol, Ethyl (B) 134 (H) <5 mg/dL    Comment:        LOWEST DETECTABLE LIMIT FOR SERUM ALCOHOL IS 5 mg/dL FOR MEDICAL PURPOSES ONLY   Protime-INR     Status: None   Collection Time: 06/27/16  8:11 PM  Result Value Ref Range   Prothrombin Time 15.0 11.4 - 15.2 seconds   INR 1.18   APTT     Status: None   Collection Time: 06/27/16  8:11 PM  Result Value Ref Range   aPTT 28 24 - 36 seconds  CBC     Status: Abnormal    Collection Time: 06/27/16  8:11 PM  Result Value Ref Range   WBC 7.3 4.0 - 10.5 K/uL   RBC 4.34 3.87 - 5.11 MIL/uL   Hemoglobin 10.9 (L) 12.0 - 15.0 g/dL   HCT 33.1 (L) 36.0 - 46.0 %   MCV 76.3 (L) 78.0 - 100.0 fL   MCH 25.1 (L) 26.0 - 34.0 pg   MCHC 32.9 30.0 - 36.0 g/dL   RDW 16.6 (H) 11.5 - 15.5 %   Platelets 100 (L) 150 - 400 K/uL    Comment: PLATELET COUNT CONFIRMED BY SMEAR  Differential     Status: None   Collection Time: 06/27/16  8:11 PM  Result Value Ref Range   Neutrophils Relative % 57 %   Neutro Abs 4.2 1.7 - 7.7 K/uL   Lymphocytes Relative 35 %   Lymphs Abs 2.6 0.7 - 4.0 K/uL   Monocytes Relative 8 %   Monocytes Absolute 0.6 0.1 - 1.0 K/uL   Eosinophils Relative 0 %   Eosinophils Absolute 0.0 0.0 - 0.7 K/uL   Basophils Relative 0 %   Basophils Absolute 0.0 0.0 - 0.1 K/uL  Comprehensive metabolic panel     Status: Abnormal   Collection Time: 06/27/16  8:11 PM  Result Value Ref Range   Sodium 137 135 - 145 mmol/L   Potassium 3.4 (L) 3.5 - 5.1 mmol/L   Chloride 106 101 - 111 mmol/L   CO2 18 (L) 22 - 32 mmol/L   Glucose, Bld 114 (H) 65 - 99 mg/dL   BUN 11 6 - 20 mg/dL   Creatinine, Ser 0.78 0.44 - 1.00 mg/dL   Calcium 9.5 8.9 - 10.3 mg/dL   Total Protein 7.9 6.5 - 8.1 g/dL   Albumin 3.5 3.5 - 5.0 g/dL   AST 56 (H) 15 - 41 U/L   ALT 37 14 - 54 U/L   Alkaline Phosphatase 104 38 - 126 U/L   Total Bilirubin 0.8 0.3 - 1.2 mg/dL   GFR calc non Af Amer >60 >60 mL/min   GFR calc Af Amer >60 >60 mL/min    Comment: (NOTE) The eGFR has been calculated using the CKD EPI equation. This calculation has not been validated in all clinical situations. eGFR's persistently <60 mL/min signify possible Chronic Kidney Disease.    Anion gap 13 5 - 15  I-stat troponin, ED (not at [facility], [facility])     Status: None   Collection Time: 06/27/16  8:16 PM  Result Value Ref Range   Troponin i, poc 0.01 0.00 - 0.08 ng/mL   Comment 3            Comment: Due to the release kinetics of  cTnI, a negative result within the first hours of the onset of symptoms does not  rule out myocardial infarction with certainty. If myocardial infarction is still suspected, repeat the test at appropriate intervals.   I-Stat Chem 8, ED  (not at [facility], [facility])     Status: Abnormal   Collection Time: 06/27/16  8:17 PM  Result Value Ref Range   Sodium 142 135 - 145 mmol/L   Potassium 3.4 (L) 3.5 - 5.1 mmol/L   Chloride 107 101 - 111 mmol/L   BUN 15 6 - 20 mg/dL   Creatinine, Ser 1.00 0.44 - 1.00 mg/dL   Glucose, Bld 117 (H) 65 - 99 mg/dL   Calcium, Ion 1.17 1.15 - 1.40 mmol/L   TCO2 21 0 - 100 mmol/L   Hemoglobin 11.9 (L) 12.0 - 15.0 g/dL   HCT 35.0 (L) 36.0 - 46.0 %  CBG monitoring, ED     Status: Abnormal   Collection Time: 06/27/16  8:41 PM  Result Value Ref Range   Glucose-Capillary 117 (H) 65 - 99 mg/dL  Urine rapid drug screen (hosp performed)not at [facility]     Status: Abnormal   Collection Time: 06/27/16  9:52 PM  Result Value Ref Range   Opiates NONE DETECTED NONE DETECTED   Cocaine POSITIVE (A) NONE DETECTED   Benzodiazepines NONE DETECTED NONE DETECTED   Amphetamines NONE DETECTED NONE DETECTED   Tetrahydrocannabinol NONE DETECTED NONE DETECTED   Barbiturates NONE DETECTED NONE DETECTED    Comment:        DRUG SCREEN FOR MEDICAL PURPOSES ONLY.  IF CONFIRMATION IS NEEDED FOR ANY PURPOSE, NOTIFY LAB WITHIN 5 DAYS.        LOWEST DETECTABLE LIMITS FOR URINE DRUG SCREEN Drug Class       Cutoff (ng/mL) Amphetamine      1000 Barbiturate      200 Benzodiazepine   545 Tricyclics       625 Opiates          300 Cocaine          300 THC              50   Urinalysis, Routine w reflex microscopic     Status: Abnormal   Collection Time: 06/27/16  9:52 PM  Result Value Ref Range   Color, Urine YELLOW YELLOW   APPearance HAZY (A) CLEAR   Specific Gravity, Urine 1.035 (H) 1.005 - 1.030   pH 6.0 5.0 - 8.0   Glucose, UA NEGATIVE NEGATIVE mg/dL   Hgb urine dipstick SMALL (A)  NEGATIVE   Bilirubin Urine NEGATIVE NEGATIVE   Ketones, ur 5 (A) NEGATIVE mg/dL   Protein, ur 100 (A) NEGATIVE mg/dL   Nitrite POSITIVE (A) NEGATIVE   Leukocytes, UA TRACE (A) NEGATIVE   RBC / HPF 0-5 0 - 5 RBC/hpf   WBC, UA 0-5 0 - 5 WBC/hpf   Bacteria, UA RARE (A) NONE SEEN   Squamous Epithelial / LPF 0-5 (A) NONE SEEN   Mucous PRESENT   Culture, Urine     Status: None (Preliminary result)   Collection Time: 06/27/16  9:53 PM  Result Value Ref Range   Specimen Description URINE, CATHETERIZED    Special Requests NONE    Culture CULTURE REINCUBATED FOR BETTER GROWTH    Report Status PENDING   Urine rapid drug screen (hosp performed)not at [facility]     Status: Abnormal   Collection Time: 06/28/16  4:43 AM  Result Value Ref Range   Opiates NONE DETECTED NONE DETECTED   Cocaine POSITIVE (A) NONE DETECTED   Benzodiazepines NONE  DETECTED NONE DETECTED   Amphetamines NONE DETECTED NONE DETECTED   Tetrahydrocannabinol NONE DETECTED NONE DETECTED   Barbiturates NONE DETECTED NONE DETECTED    Comment:        DRUG SCREEN FOR MEDICAL PURPOSES ONLY.  IF CONFIRMATION IS NEEDED FOR ANY PURPOSE, NOTIFY LAB WITHIN 5 DAYS.        LOWEST DETECTABLE LIMITS FOR URINE DRUG SCREEN Drug Class       Cutoff (ng/mL) Amphetamine      1000 Barbiturate      200 Benzodiazepine   409 Tricyclics       811 Opiates          300 Cocaine          300 THC              50   Hemoglobin A1c     Status: None   Collection Time: 06/28/16  4:49 AM  Result Value Ref Range   Hgb A1c MFr Bld 5.4 4.8 - 5.6 %    Comment: (NOTE)         Pre-diabetes: 5.7 - 6.4         Diabetes: >6.4         Glycemic control for adults with diabetes: <7.0    Mean Plasma Glucose 108 mg/dL    Comment: (NOTE) Performed At: [facility] [place], [place] [id number] Lindon Romp MD OZ:[id number]   Lipid panel     Status: Abnormal   Collection Time: 06/28/16  4:49 AM  Result Value Ref Range   Cholesterol  190 0 - 200 mg/dL   Triglycerides 70 <150 mg/dL   HDL 48 >40 mg/dL   Total CHOL/HDL Ratio 4.0 RATIO   VLDL 14 0 - 40 mg/dL   LDL Cholesterol 128 (H) 0 - 99 mg/dL    Comment:        Total Cholesterol/HDL:CHD Risk Coronary Heart Disease Risk Table                     Men   Women  1/2 Average Risk   3.4   3.3  Average Risk       5.0   4.4  2 X Average Risk   9.6   7.1  3 X Average Risk  23.4   11.0        Use the calculated Patient Ratio above and the CHD Risk Table to determine the patient's CHD Risk.        ATP III CLASSIFICATION (LDL):  <100     mg/dL   Optimal  100-129  mg/dL   Near or Above                    Optimal  130-159  mg/dL   Borderline  160-189  mg/dL   High  >190     mg/dL   Very High   Troponin I     Status: None   Collection Time: 06/28/16  4:49 AM  Result Value Ref Range   Troponin I <0.03 <0.03 ng/mL  Blood Alcohol Level     Status: None   Collection Time: 06/28/16  4:49 AM  Result Value Ref Range   Alcohol, Ethyl (B) <5 <5 mg/dL    Comment:        LOWEST DETECTABLE LIMIT FOR SERUM ALCOHOL IS 5 mg/dL FOR MEDICAL PURPOSES ONLY   Magnesium     Status: None   Collection Time: 06/28/16  4:49 AM  Result Value Ref Range   Magnesium 1.7 1.7 - 2.4 mg/dL  Phosphorus     Status: None   Collection Time: 06/28/16  4:49 AM  Result Value Ref Range   Phosphorus 3.9 2.5 - 4.6 mg/dL  CBC     Status: Abnormal   Collection Time: 06/28/16  7:24 AM  Result Value Ref Range   WBC 8.0 4.0 - 10.5 K/uL   RBC 4.21 3.87 - 5.11 MIL/uL   Hemoglobin 10.2 (L) 12.0 - 15.0 g/dL   HCT 31.9 (L) 36.0 - 46.0 %   MCV 75.8 (L) 78.0 - 100.0 fL   MCH 24.2 (L) 26.0 - 34.0 pg   MCHC 32.0 30.0 - 36.0 g/dL   RDW 16.3 (H) 11.5 - 15.5 %   Platelets 100 (L) 150 - 400 K/uL    Comment: REPEATED TO VERIFY SPECIMEN CHECKED FOR CLOTS CONSISTENT WITH PREVIOUS RESULT   Creatinine, serum     Status: None   Collection Time: 06/28/16  7:24 AM  Result Value Ref Range   Creatinine, Ser 0.67  0.44 - 1.00 mg/dL   GFR calc non Af Amer >60 >60 mL/min   GFR calc Af Amer >60 >60 mL/min    Comment: (NOTE) The eGFR has been calculated using the CKD EPI equation. This calculation has not been validated in all clinical situations. eGFR's persistently <60 mL/min signify possible Chronic Kidney Disease.   TSH     Status: None   Collection Time: 06/28/16  7:24 AM  Result Value Ref Range   TSH 3.314 0.350 - 4.500 uIU/mL    Comment: Performed by a 3rd Generation assay with a functional sensitivity of <=0.01 uIU/mL.  MRSA PCR Screening     Status: None   Collection Time: 06/28/16  4:11 PM  Result Value Ref Range   MRSA by PCR NEGATIVE NEGATIVE    Comment:        The GeneXpert MRSA Assay (FDA approved for NASAL specimens only), is one component of a comprehensive MRSA colonization surveillance program. It is not intended to diagnose MRSA infection nor to guide or monitor treatment for MRSA infections.   CBC     Status: Abnormal   Collection Time: 06/29/16  2:56 AM  Result Value Ref Range   WBC 6.2 4.0 - 10.5 K/uL   RBC 4.06 3.87 - 5.11 MIL/uL   Hemoglobin 10.0 (L) 12.0 - 15.0 g/dL   HCT 30.9 (L) 36.0 - 46.0 %   MCV 76.1 (L) 78.0 - 100.0 fL   MCH 24.6 (L) 26.0 - 34.0 pg   MCHC 32.4 30.0 - 36.0 g/dL   RDW 16.4 (H) 11.5 - 15.5 %   Platelets 95 (L) 150 - 400 K/uL    Comment: CONSISTENT WITH PREVIOUS RESULT  Basic metabolic panel     Status: Abnormal   Collection Time: 06/29/16  2:56 AM  Result Value Ref Range   Sodium 137 135 - 145 mmol/L   Potassium 3.3 (L) 3.5 - 5.1 mmol/L   Chloride 106 101 - 111 mmol/L   CO2 25 22 - 32 mmol/L   Glucose, Bld 119 (H) 65 - 99 mg/dL   BUN 14 6 - 20 mg/dL   Creatinine, Ser 0.75 0.44 - 1.00 mg/dL   Calcium 9.3 8.9 - 10.3 mg/dL   GFR calc non Af Amer >60 >60 mL/min   GFR calc Af Amer >60 >60 mL/min    Comment: (NOTE) The eGFR has been calculated using the CKD  EPI equation. This calculation has not been validated in all clinical  situations. eGFR's persistently <60 mL/min signify possible Chronic Kidney Disease.    Anion gap 6 5 - 15    Imaging: Ct Angio Head W Or Wo Contrast  Result Date: 06/27/2016 CLINICAL DATA:  Initial evaluation for acute right-sided weakness. EXAM: CT ANGIOGRAPHY HEAD AND NECK TECHNIQUE: Multidetector CT imaging of the head and neck was performed using the standard protocol during bolus administration of intravenous contrast. Multiplanar CT image reconstructions and MIPs were obtained to evaluate the vascular anatomy. Carotid stenosis measurements (when applicable) are obtained utilizing NASCET criteria, using the distal internal carotid diameter as the denominator. CONTRAST:  50 cc of Isovue 370. COMPARISON:  Prior head CT from earlier the same day. FINDINGS: CTA NECK FINDINGS Aortic arch: Visualized aortic arch of normal caliber with normal 3 vessel morphology. Focal plaque at the origin of the left subclavian artery without significant stenosis. No high-grade stenosis at the origin of the great vessels. Visualized subclavian arteries widely patent. Right carotid system: Right common carotid artery widely patent from its origin to the bifurcation. Calcified and noncalcified plaque about the right bifurcation/ proximal right ICA. Associated severe short-segment stenosis of approximately 80% by NASCET criteria (series 7, image 204). Area affected begins at the bifurcation and measures approximately 13 mm in length. Distally, right ICA patent to the skullbase without stenosis, dissection, or occlusion. No made of fairly severe atheromatous stenosis at the origin of the right external carotid artery is well. Left carotid system: Left common carotid artery patent from its origin to the bifurcation without flow-limiting stenosis. Scattered eccentric calcified plaque noted within the distal left common carotid artery. Calcified plaque at the left bifurcation/proximal left ICA. Associated short-segment stenosis of  approximately 40% by NASCET criteria. Distally, left ICA patent to the skullbase without stenosis, dissection, or occlusion. Calcified plaque at the origin of the left external carotid artery without high-grade stenosis. Vertebral arteries: Both of the vertebral arteries arise from the subclavian arteries. Left vertebral artery dominant. Right vertebral artery diffusely hypoplastic. Focal plaque near the origin of the left vertebral artery without flow-limiting stenosis. Vertebral arteries patent within the neck without stenosis, dissection, or occlusion. Skeleton: No acute osseous abnormality. No worrisome lytic or blastic osseous lesions. Moderate degenerative spondylolysis present at C5-6 and C6-7. Other neck: Soft tissues of the neck demonstrate no acute abnormality. No adenopathy. Upper chest: Visualized upper mediastinum within normal limits. Visualized lungs are clear. Review of the MIP images confirms the above findings CTA HEAD FINDINGS Anterior circulation: Petrous segments widely patent. Scattered atheromatous plaque throughout the cavernous/ supraclinoid ICAs with mild to moderate diffuse narrowing. Right A1 segment patent. Moderate narrowing at the origin of the left A1 segment. Anterior communicating artery patent. Anterior cerebral arteries demonstrate multifocal atheromatous irregularity. There are superimposed severe stenoses (series 7, image 83 on the right, in 01/1983 on the left). PCAs are patent to their distal aspects. M1 segments demonstrate mild atheromatous irregularity without flow-limiting stenosis or occlusion. There is a saccular aneurysm measuring 4 x 3 x 4 mm arising from the left MCA bifurcation (series 7, image 110). No proximal M2 occlusion. Multifocal atheromatous irregularity throughout the MCA branches bilaterally. Posterior circulation: Dominant left vertebral artery widely patent to the vertebrobasilar junction. Diminutive right vertebral artery patent as well. Posterior  inferior cerebral arteries patent bilaterally. Basilar artery demonstrates mild multifocal atheromatous irregularity without flow-limiting stenosis. Superior cerebral arteries patent bilaterally. PCAs primarily supplied via the basilar artery. Multifocal atheromatous regularity within  the left P1 segment with moderate stenoses. Additional atheromatous irregularity within the PCAs distally. There are associated multifocal moderate to severe stenoses within the right P2 segment, which is somewhat attenuated as compared to the left. PCAs are supplied to their distal aspects. Venous sinuses: Patent. Anatomic variants: No significant anatomic variant. Delayed phase: Not performed. Review of the MIP images confirms the above findings IMPRESSION: 1. Negative CTA for emergent large vessel occlusion. Evolving left pontine infarct stable from previous CT. 2. 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 3. Moderate atheromatous disease involving the anterior and posterior circulations as above, most notable within the distal ACAs and right PCA. 4. 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 Critical Value/emergent results were called by telephone at the time of interpretation on 06/27/2016 at 9:07 pm to Dr. Roland Rack , who verbally acknowledged these results. Electronically Signed   By: Jeannine Boga M.D.   On: 06/27/2016 21:10   Ct Angio Neck W Or Wo Contrast  Result Date: 06/27/2016 CLINICAL DATA:  Initial evaluation for acute right-sided weakness. EXAM: CT ANGIOGRAPHY HEAD AND NECK TECHNIQUE: Multidetector CT imaging of the head and neck was performed using the standard protocol during bolus administration of intravenous contrast. Multiplanar CT image reconstructions and MIPs were obtained to evaluate the vascular anatomy. Carotid stenosis measurements (when applicable) are obtained utilizing NASCET criteria, using the distal  internal carotid diameter as the denominator. CONTRAST:  50 cc of Isovue 370. COMPARISON:  Prior head CT from earlier the same day. FINDINGS: CTA NECK FINDINGS Aortic arch: Visualized aortic arch of normal caliber with normal 3 vessel morphology. Focal plaque at the origin of the left subclavian artery without significant stenosis. No high-grade stenosis at the origin of the great vessels. Visualized subclavian arteries widely patent. Right carotid system: Right common carotid artery widely patent from its origin to the bifurcation. Calcified and noncalcified plaque about the right bifurcation/ proximal right ICA. Associated severe short-segment stenosis of approximately 80% by NASCET criteria (series 7, image 204). Area affected begins at the bifurcation and measures approximately 13 mm in length. Distally, right ICA patent to the skullbase without stenosis, dissection, or occlusion. No made of fairly severe atheromatous stenosis at the origin of the right external carotid artery is well. Left carotid system: Left common carotid artery patent from its origin to the bifurcation without flow-limiting stenosis. Scattered eccentric calcified plaque noted within the distal left common carotid artery. Calcified plaque at the left bifurcation/proximal left ICA. Associated short-segment stenosis of approximately 40% by NASCET criteria. Distally, left ICA patent to the skullbase without stenosis, dissection, or occlusion. Calcified plaque at the origin of the left external carotid artery without high-grade stenosis. Vertebral arteries: Both of the vertebral arteries arise from the subclavian arteries. Left vertebral artery dominant. Right vertebral artery diffusely hypoplastic. Focal plaque near the origin of the left vertebral artery without flow-limiting stenosis. Vertebral arteries patent within the neck without stenosis, dissection, or occlusion. Skeleton: No acute osseous abnormality. No worrisome lytic or blastic  osseous lesions. Moderate degenerative spondylolysis present at C5-6 and C6-7. Other neck: Soft tissues of the neck demonstrate no acute abnormality. No adenopathy. Upper chest: Visualized upper mediastinum within normal limits. Visualized lungs are clear. Review of the MIP images confirms the above findings CTA HEAD FINDINGS Anterior circulation: Petrous segments widely patent. Scattered atheromatous plaque throughout the cavernous/ supraclinoid ICAs with mild to moderate diffuse narrowing. Right A1 segment  patent. Moderate narrowing at the origin of the left A1 segment. Anterior communicating artery patent. Anterior cerebral arteries demonstrate multifocal atheromatous irregularity. There are superimposed severe stenoses (series 7, image 83 on the right, in 01/1983 on the left). PCAs are patent to their distal aspects. M1 segments demonstrate mild atheromatous irregularity without flow-limiting stenosis or occlusion. There is a saccular aneurysm measuring 4 x 3 x 4 mm arising from the left MCA bifurcation (series 7, image 110). No proximal M2 occlusion. Multifocal atheromatous irregularity throughout the MCA branches bilaterally. Posterior circulation: Dominant left vertebral artery widely patent to the vertebrobasilar junction. Diminutive right vertebral artery patent as well. Posterior inferior cerebral arteries patent bilaterally. Basilar artery demonstrates mild multifocal atheromatous irregularity without flow-limiting stenosis. Superior cerebral arteries patent bilaterally. PCAs primarily supplied via the basilar artery. Multifocal atheromatous regularity within the left P1 segment with moderate stenoses. Additional atheromatous irregularity within the PCAs distally. There are associated multifocal moderate to severe stenoses within the right P2 segment, which is somewhat attenuated as compared to the left. PCAs are supplied to their distal aspects. Venous sinuses: Patent. Anatomic variants: No significant  anatomic variant. Delayed phase: Not performed. Review of the MIP images confirms the above findings IMPRESSION: 1. Negative CTA for emergent large vessel occlusion. Evolving left pontine infarct stable from previous CT. 2. 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 3. Moderate atheromatous disease involving the anterior and posterior circulations as above, most notable within the distal ACAs and right PCA. 4. 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 Critical Value/emergent results were called by telephone at the time of interpretation on 06/27/2016 at 9:07 pm to Dr. Roland Rack , who verbally acknowledged these results. Electronically Signed   By: Jeannine Boga M.D.   On: 06/27/2016 21:10   Mr Jodene Nam Head Wo Contrast  Result Date: 06/28/2016 CLINICAL DATA:  66 y/o F; status post fall, right hemi paresis, a aphasia. EXAM: MRI HEAD WITHOUT CONTRAST MRA HEAD WITHOUT CONTRAST TECHNIQUE: Multiplanar, multiecho pulse sequences of the brain and surrounding structures were obtained without intravenous contrast. Angiographic images of the head were obtained using MRA technique without contrast. COMPARISON:  CT head and CT angiogram head and neck dated 06/27/2016. FINDINGS: MRI HEAD FINDINGS Brain: Left paramedian pons focus of acute diffusion restriction similar in distribution to hypodensity on CT compatible with acute infarction. Few foci of T2 FLAIR hyperintensity in subcortical and periventricular white matter compatible with mild chronic microvascular ischemic changes. Mild diffuse brain parenchymal volume loss. Small chronic lacunar infarct within left caudate body. Single punctate focus of susceptibility hypointensity within the left temporal lobe is compatible with hemosiderin deposition of old microhemorrhage. No focal mass effect, extra-axial collection, or hydrocephalus. Vascular: As below. Skull and upper cervical  spine: Normal marrow signal. Sinuses/Orbits: Negative. Other: None. MRA HEAD FINDINGS Internal carotid arteries: Prominent infundibular origin of right anterior choroidal artery. Left MCA bifurcation 3 x 4 mm aneurysm as seen on prior CT angiogram (series 6, image 101). Anterior cerebral arteries:  Patent. Middle cerebral arteries: Patent. Anterior communicating artery: Patent. Posterior communicating arteries:  Patent. Posterior cerebral arteries:  Patent. Basilar artery:  Patent. Vertebral arteries:  Patent. No large vessel occlusion or high-grade stenosis. Intracranial atherosclerosis with multiple segments of mild stenosis in the anterior and posterior circulation most pronounced and proximal PCA and left proximal A1. IMPRESSION: 1. 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 No hemorrhage. 2. Background of mild chronic microvascular ischemic changes and parenchymal volume loss of the brain. 3. No large vessel occlusion or high-grade stenosis of the circle of Willis. 4. Intracranial atherosclerosis with multiple areas of mild stenosis most pronounced in proximal PCA and left proximal A1 segments. 5. 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   On: 06/28/2016 04:38   Mr Brain Wo Contrast  Result Date: 06/28/2016 CLINICAL DATA:  65 y/o F; status post fall, right hemi paresis, a aphasia. EXAM: MRI HEAD WITHOUT CONTRAST MRA HEAD WITHOUT CONTRAST TECHNIQUE: Multiplanar, multiecho pulse sequences of the brain and surrounding structures were obtained without intravenous contrast. Angiographic images of the head were obtained using MRA technique without contrast. COMPARISON:  CT head and CT angiogram head and neck dated 06/27/2016. FINDINGS: MRI HEAD FINDINGS Brain: Left paramedian pons focus of acute diffusion restriction similar in distribution to hypodensity on CT compatible  with acute infarction. Few foci of T2 FLAIR hyperintensity in subcortical and periventricular white matter compatible with mild chronic microvascular ischemic changes. Mild diffuse brain parenchymal volume loss. Small chronic lacunar infarct within left caudate body. Single punctate focus of susceptibility hypointensity within the left temporal lobe is compatible with hemosiderin deposition of old microhemorrhage. No focal mass effect, extra-axial collection, or hydrocephalus. Vascular: As below. Skull and upper cervical spine: Normal marrow signal. Sinuses/Orbits: Negative. Other: None. MRA HEAD FINDINGS Internal carotid arteries: Prominent infundibular origin of right anterior choroidal artery. Left MCA bifurcation 3 x 4 mm aneurysm as seen on prior CT angiogram (series 6, image 101). Anterior cerebral arteries:  Patent. Middle cerebral arteries: Patent. Anterior communicating artery: Patent. Posterior communicating arteries:  Patent. Posterior cerebral arteries:  Patent. Basilar artery:  Patent. Vertebral arteries:  Patent. No large vessel occlusion or high-grade stenosis. Intracranial atherosclerosis with multiple segments of mild stenosis in the anterior and posterior circulation most pronounced and proximal PCA and left proximal A1. IMPRESSION: 1. Left paramedian pontine acute infarction is stable in distribution comparing with prior CT given differences in technique. No hemorrhage. 2. Background of mild chronic microvascular ischemic changes and parenchymal volume loss of the brain. 3. No large vessel occlusion or high-grade stenosis of the circle of Willis. 4. Intracranial atherosclerosis with multiple areas of mild stenosis most pronounced in proximal PCA and left proximal A1 segments. 5. 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   On: 06/28/2016 04:38   Ct Head Code Stroke W/o Cm  Result Date: 06/27/2016 CLINICAL DATA:  Code stroke. Initial  evaluation for acute right-sided weakness. EXAM: CT HEAD WITHOUT CONTRAST TECHNIQUE: Contiguous axial images were obtained from the base of the skull through the vertex without intravenous contrast. COMPARISON:  None. FINDINGS: Brain: Age-related cerebral atrophy with mild chronic small vessel ischemic disease. No acute intracranial hemorrhage. There is a subtle hypodensity measuring approximately 16 mm within the left paramedian pons, suspicious for evolving acute ischemic infarct (series 2, image 8). No associated mass effect. No other evidence for acute large vessel territory infarct. Gray-white matter differentiation otherwise maintained. No mass lesion, midline shift, or mass effect. No hydrocephalus. No extra-axial fluid collection. Vascular: No hyperdense vessel. Scattered vascular calcifications present within the carotid siphons. Skull: Scalp soft tissues within normal limits.  Calvarium intact. Sinuses/Orbits: Globes and orbital soft tissues within normal limits. Scattered mucosal thickening within the ethmoidal air cells. Paranasal sinuses are otherwise clear. No mastoid effusion.  ASPECTS [facility] Stroke Program Early CT Score) - Ganglionic level infarction (caudate, lentiform nuclei, internal capsule, insula, M1-M3 cortex): 7 - Supraganglionic infarction (M4-M6 cortex): 3 Total score (0-10 with 10 being normal): 10 IMPRESSION: 1. Focal hypodensity within the left paramedian pons, suspicious for evolving acute ischemic infarct. 2. ASPECTS is 10 3. Age-related cerebral atrophy with mild chronic small vessel ischemic disease. Critical Value/emergent results were called by telephone at the time of interpretation on 06/27/2016 at 8:27 pm to Dr. Kathrynn Speed, who verbally acknowledged these results. Electronically Signed   By: Jeannine Boga M.D.   On: 06/27/2016 20:33    Assessment/Plan 1. Right MCA aneurysm -we will plan to proceed tomorrow with a diagnostic angiogram to further evaluate this  area. -recent CVA.  This angiogram will also allow [place] to evaluate the area of her CVA as well. -labs and vitals have been reviewed -Risks and Benefits discussed with the patient including, but not limited to bleeding, infection, vascular injury or contrast induced renal failure. All of the patient's questions were answered, patient is agreeable to proceed. Consent signed and in chart.  Thank you for this interesting consult.  I greatly enjoyed meeting Deborah Wise and look forward to participating in their care.  A copy of this report was sent to the requesting provider on this date.  Electronically Signed: Henreitta Cea 06/29/2016, 3:47 PM   I spent a total of 40 Minutes    in face to face in clinical consultation, greater than 50% of which was counseling/coordinating care for right MCA aneurysm

## 2016-06-29 NOTE — Consult Note (Signed)
Physical Medicine and Rehabilitation Consult Reason for Consult: Left pontine infarct Referring Physician: Family practice   HPI: Deborah Wise is a 66 y.o. right handed female with history of diabetes mellitus, hypertension, hepatitis C, alcohol abuse with liver cirrhosis and thrombocytopenia. Per chart review patient lives alone independently with occasional prior to admission. One level home. 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 She has a boyfriend that can assist. Presented 06/27/2016 with right-sided weakness, slurred speech and fall. Urine drug screen positive for cocaine. Cranial CT scan showed focal hypodensity within the left paramedian pons, suspicious for evolving acute ischemic infarct. Patient did not receive TPA. CT angiogram head and neck negative for emergent large vessel occlusion. 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 MRI 06/28/2016 with left paramedian pontine acute infarct stable in comparison to prior CT. No hemorrhage. Echocardiogram with ejection fraction 16% grade 1 diastolic dysfunction. No regional wall motion abnormalities. Carotid Dopplers with right 60-79% ICA stenosis. Aspirin for CVA prophylaxis. Subcutaneous Lovenox for DVT prophylaxis. Tolerating a regular diet. Physical and occupational therapy evaluations completed with recommendations of physical medicine rehabilitation consult.   Review of Systems  Constitutional: Negative for chills and fever.  HENT: Negative for hearing loss and tinnitus.   Eyes: Negative for blurred vision and double vision.  Respiratory: Negative for cough and shortness of breath.   Cardiovascular: Positive for leg swelling. Negative for chest pain and palpitations.  Gastrointestinal: Positive for constipation. Negative for nausea.       GERD  Genitourinary: Negative for flank pain and hematuria.       Occasional stress  incontinence  Musculoskeletal: Positive for falls, joint pain and myalgias.  Skin: Negative for rash.  Neurological: Positive for speech change and weakness. Negative for seizures.       Occasional dizziness  All other systems reviewed and are negative.  Past Medical History:  Diagnosis Date  . Arthritis   . Diabetes mellitus without complication ([place])   . Fibroid, uterine   . GERD (gastroesophageal reflux disease)   . Hepatitis    hep C  . Hypertension   . Thrombocytopenia ([place]) 12/25/2014  . Urinary incontinence    Patient noticed mild and is not currently a significant problem   Past Surgical History:  Procedure Laterality Date  . ANKLE SURGERY    . broken ankle    . MULTIPLE EXTRACTIONS WITH ALVEOLOPLASTY  10/25/2011   Procedure: MULTIPLE EXTRACION WITH ALVEOLOPLASTY;  Surgeon: Gae Bon, DDS;  Location: [facility];  Service: Oral Surgery;  Laterality: Bilateral;  Extract teeth numbers one, two, six, seven, eight, nine, ten, eleven, twelve, fifteen, sixteen, eighteen, nineteen, twenty-three, twenty-four, twenty-five, twenty-seven, thirty-two and alveoplasty.   Family History  Problem Relation Age of Onset  . Diabetes type II Mother   . Hypertension Mother   . Cancer Father     patient does not know what type of cancer   Social History:  reports that she has never smoked. She has never used smokeless tobacco. She reports that she drinks about 0.6 oz of alcohol per week . She reports that she does not use drugs. Allergies: No Known Allergies Medications Prior to Admission  Medication Sig Dispense Refill  . amLODipine (NORVASC) 10 MG tablet Take 1 tablet (10 mg total) by mouth daily. 30 tablet 0  . Oxycodone HCl 10 MG TABS Take 10 mg by  mouth 3 (three) times daily as needed for pain.  0  . ACCU-CHEK AVIVA PLUS test strip USE TO CHECK BLOOD SUGAR TWICE A DAY  3  . glucose monitoring kit (FREESTYLE) monitoring kit 1 each by Does not apply route 4 (four) times daily - after meals  and at bedtime. 1 month Diabetic Testing Supplies for QAC-QHS accuchecks. Any brand OK 1 each 1  . metFORMIN (GLUCOPHAGE) 500 MG tablet Take 500 mg by mouth daily with breakfast.     . oxyCODONE (ROXICODONE) 5 MG immediate release tablet Take 1 tablet (5 mg total) by mouth every 4 (four) hours as needed for severe pain. (Patient not taking: Reported on 06/27/2016) 8 tablet 0  . thiamine (VITAMIN B-1) 100 MG tablet Take 1 tablet (100 mg total) by mouth daily. (Patient not taking: Reported on 06/27/2016) 30 tablet 0    Home: Home Living Family/patient expects to be discharged to:: Private residence Living Arrangements: Alone Available Help at Discharge: Family, Available PRN/intermittently Type of Home: Apartment Home Access: Stairs to enter Technical brewer of Steps: flight Entrance Stairs-Rails: Can reach both Home Layout: One level Bathroom Shower/Tub: Chiropodist: Standard Home Equipment: Cane - single point  Functional History: Prior Function Level of Independence: Independent Comments: 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  Functional Status:  Mobility: Bed Mobility General bed mobility comments: received OOB Transfers Overall transfer level: Needs assistance Equipment used: None Transfers: Sit to/from Stand Sit to Stand: Min guard General transfer comment: min guard assist for stability in elevation to standing Ambulation/Gait Ambulation/Gait assistance: Min assist Ambulation Distance (Feet): 90 Feet Assistive device: 1 person hand held assist Gait Pattern/deviations: Step-through pattern, Decreased stride length, Decreased stance time - right, Narrow base of support General Gait Details: patient with some instability during ambulation, ocassional right leg lag noted with increased instability. Multiple balance checks with min assist for stability Gait velocity: decreased Gait velocity interpretation: Below normal speed for  age/gender    ADL: ADL Overall ADL's : Needs assistance/impaired Eating/Feeding: Modified independent, Sitting Grooming: Brushing hair, Oral care, Standing, Min guard Grooming Details (indicate cue type and reason): pt able to open containers and apply toothpaste to toothbrush Upper Body Bathing: Set up, Sitting Lower Body Bathing: Minimal assistance, Sit to/from stand Lower Body Bathing Details (indicate cue type and reason): assist for balance Upper Body Dressing : Minimal assistance, Sitting Lower Body Dressing: Minimal assistance, Sit to/from stand Lower Body Dressing Details (indicate cue type and reason): able to don and doff socks, min assist for balance with standing Toilet Transfer: Minimal assistance, Ambulation Toileting- Clothing Manipulation and Hygiene: Minimal assistance, Sit to/from stand Functional mobility during ADLs: Minimal assistance  Cognition: Cognition Overall Cognitive Status: Impaired/Different from baseline Orientation Level: Oriented X4 Cognition Arousal/Alertness: Awake/alert Behavior During Therapy: Impulsive Overall Cognitive Status: Impaired/Different from baseline Area of Impairment: Safety/judgement Safety/Judgement: Decreased awareness of safety, Decreased awareness of deficits General Comments: pt attempting to walk with lines attached to monitor  Blood pressure (!) 167/72, pulse 68, temperature 97.5 F (36.4 C), temperature source Oral, resp. rate 20, height _0  (1.651 m), weight 74 kg (163 lb 2.3 oz), SpO2 100 %. Physical Exam  Vitals reviewed. HENT:  Head: Normocephalic.  Eyes: EOM are normal.  Neck: Normal range of motion. Neck supple. No thyromegaly present.  Cardiovascular: Normal rate and normal heart sounds.   Respiratory: Effort normal and breath sounds normal. No respiratory distress.  GI: Soft. Bowel sounds are normal. She exhibits no  distension. There is no tenderness.  Neurological: She is alert.  Makes good eye contact  with examiner. Follows simple commands. She can provide her name and age and date of birth. Fair awareness of deficits. Right central 7 and tongue deviation. Speech is very dysarthric. RUE: 3 to 3+/5 with poor fine motor coordination. She could touch her nose. RLE: 3+HF, KE and 4/5 ADF/PF again with poor coordination  Skin: Skin is warm and dry.  Psychiatric: She has a normal mood and affect. Her behavior is normal.    Results for orders placed or performed during the hospital encounter of 06/27/16 (from the past 24 hour(s))  MRSA PCR Screening     Status: None   Collection Time: 06/28/16  4:11 PM  Result Value Ref Range   MRSA by PCR NEGATIVE NEGATIVE  CBC     Status: Abnormal   Collection Time: 06/29/16  2:56 AM  Result Value Ref Range   WBC 6.2 4.0 - 10.5 K/uL   RBC 4.06 3.87 - 5.11 MIL/uL   Hemoglobin 10.0 (L) 12.0 - 15.0 g/dL   HCT 30.9 (L) 36.0 - 46.0 %   MCV 76.1 (L) 78.0 - 100.0 fL   MCH 24.6 (L) 26.0 - 34.0 pg   MCHC 32.4 30.0 - 36.0 g/dL   RDW 16.4 (H) 11.5 - 15.5 %   Platelets 95 (L) 150 - 400 K/uL  Basic metabolic panel     Status: Abnormal   Collection Time: 06/29/16  2:56 AM  Result Value Ref Range   Sodium 137 135 - 145 mmol/L   Potassium 3.3 (L) 3.5 - 5.1 mmol/L   Chloride 106 101 - 111 mmol/L   CO2 25 22 - 32 mmol/L   Glucose, Bld 119 (H) 65 - 99 mg/dL   BUN 14 6 - 20 mg/dL   Creatinine, Ser 0.75 0.44 - 1.00 mg/dL   Calcium 9.3 8.9 - 10.3 mg/dL   GFR calc non Af Amer >60 >60 mL/min   GFR calc Af Amer >60 >60 mL/min   Anion gap 6 5 - 15   Ct Angio Head W Or Wo Contrast  Result Date: 06/27/2016 CLINICAL DATA:  Initial evaluation for acute right-sided weakness. EXAM: CT ANGIOGRAPHY HEAD AND NECK TECHNIQUE: Multidetector CT imaging of the head and neck was performed using the standard protocol during bolus administration of intravenous contrast. Multiplanar CT image reconstructions and MIPs were obtained to evaluate the vascular anatomy. Carotid stenosis  measurements (when applicable) are obtained utilizing NASCET criteria, using the distal internal carotid diameter as the denominator. CONTRAST:  50 cc of Isovue 370. COMPARISON:  Prior head CT from earlier the same day. FINDINGS: CTA NECK FINDINGS Aortic arch: Visualized aortic arch of normal caliber with normal 3 vessel morphology. Focal plaque at the origin of the left subclavian artery without significant stenosis. No high-grade stenosis at the origin of the great vessels. Visualized subclavian arteries widely patent. Right carotid system: Right common carotid artery widely patent from its origin to the bifurcation. Calcified and noncalcified plaque about the right bifurcation/ proximal right ICA. Associated severe short-segment stenosis of approximately 80% by NASCET criteria (series 7, image 204). Area affected begins at the bifurcation and measures approximately 13 mm in length. Distally, right ICA patent to the skullbase without stenosis, dissection, or occlusion. No made of fairly severe atheromatous stenosis at the origin of the right external carotid artery is well. Left carotid system: Left common carotid artery patent from its origin to the bifurcation without flow-limiting  stenosis. Scattered eccentric calcified plaque noted within the distal left common carotid artery. Calcified plaque at the left bifurcation/proximal left ICA. Associated short-segment stenosis of approximately 40% by NASCET criteria. Distally, left ICA patent to the skullbase without stenosis, dissection, or occlusion. Calcified plaque at the origin of the left external carotid artery without high-grade stenosis. Vertebral arteries: Both of the vertebral arteries arise from the subclavian arteries. Left vertebral artery dominant. Right vertebral artery diffusely hypoplastic. Focal plaque near the origin of the left vertebral artery without flow-limiting stenosis. Vertebral arteries patent within the neck without stenosis, dissection,  or occlusion. Skeleton: No acute osseous abnormality. No worrisome lytic or blastic osseous lesions. Moderate degenerative spondylolysis present at C5-6 and C6-7. Other neck: Soft tissues of the neck demonstrate no acute abnormality. No adenopathy. Upper chest: Visualized upper mediastinum within normal limits. Visualized lungs are clear. Review of the MIP images confirms the above findings CTA HEAD FINDINGS Anterior circulation: Petrous segments widely patent. Scattered atheromatous plaque throughout the cavernous/ supraclinoid ICAs with mild to moderate diffuse narrowing. Right A1 segment patent. Moderate narrowing at the origin of the left A1 segment. Anterior communicating artery patent. Anterior cerebral arteries demonstrate multifocal atheromatous irregularity. There are superimposed severe stenoses (series 7, image 83 on the right, in 01/1983 on the left). PCAs are patent to their distal aspects. M1 segments demonstrate mild atheromatous irregularity without flow-limiting stenosis or occlusion. There is a saccular aneurysm measuring 4 x 3 x 4 mm arising from the left MCA bifurcation (series 7, image 110). No proximal M2 occlusion. Multifocal atheromatous irregularity throughout the MCA branches bilaterally. Posterior circulation: Dominant left vertebral artery widely patent to the vertebrobasilar junction. Diminutive right vertebral artery patent as well. Posterior inferior cerebral arteries patent bilaterally. Basilar artery demonstrates mild multifocal atheromatous irregularity without flow-limiting stenosis. Superior cerebral arteries patent bilaterally. PCAs primarily supplied via the basilar artery. Multifocal atheromatous regularity within the left P1 segment with moderate stenoses. Additional atheromatous irregularity within the PCAs distally. There are associated multifocal moderate to severe stenoses within the right P2 segment, which is somewhat attenuated as compared to the left. PCAs are supplied  to their distal aspects. Venous sinuses: Patent. Anatomic variants: No significant anatomic variant. Delayed phase: Not performed. Review of the MIP images confirms the above findings IMPRESSION: 1. Negative CTA for emergent large vessel occlusion. Evolving left pontine infarct stable from previous CT. 2. 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 3. Moderate atheromatous disease involving the anterior and posterior circulations as above, most notable within the distal ACAs and right PCA. 4. 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 Critical Value/emergent results were called by telephone at the time of interpretation on 06/27/2016 at 9:07 pm to Dr. Roland Rack , who verbally acknowledged these results. Electronically Signed   By: Jeannine Boga M.D.   On: 06/27/2016 21:10   Ct Angio Neck W Or Wo Contrast  Result Date: 06/27/2016 CLINICAL DATA:  Initial evaluation for acute right-sided weakness. EXAM: CT ANGIOGRAPHY HEAD AND NECK TECHNIQUE: Multidetector CT imaging of the head and neck was performed using the standard protocol during bolus administration of intravenous contrast. Multiplanar CT image reconstructions and MIPs were obtained to evaluate the vascular anatomy. Carotid stenosis measurements (when applicable) are obtained utilizing NASCET criteria, using the distal internal carotid diameter as the denominator. CONTRAST:  50 cc of Isovue 370. COMPARISON:  Prior head CT from earlier the same day. FINDINGS: CTA  NECK FINDINGS Aortic arch: Visualized aortic arch of normal caliber with normal 3 vessel morphology. Focal plaque at the origin of the left subclavian artery without significant stenosis. No high-grade stenosis at the origin of the great vessels. Visualized subclavian arteries widely patent. Right carotid system: Right common carotid artery widely patent from its origin to the bifurcation. Calcified and  noncalcified plaque about the right bifurcation/ proximal right ICA. Associated severe short-segment stenosis of approximately 80% by NASCET criteria (series 7, image 204). Area affected begins at the bifurcation and measures approximately 13 mm in length. Distally, right ICA patent to the skullbase without stenosis, dissection, or occlusion. No made of fairly severe atheromatous stenosis at the origin of the right external carotid artery is well. Left carotid system: Left common carotid artery patent from its origin to the bifurcation without flow-limiting stenosis. Scattered eccentric calcified plaque noted within the distal left common carotid artery. Calcified plaque at the left bifurcation/proximal left ICA. Associated short-segment stenosis of approximately 40% by NASCET criteria. Distally, left ICA patent to the skullbase without stenosis, dissection, or occlusion. Calcified plaque at the origin of the left external carotid artery without high-grade stenosis. Vertebral arteries: Both of the vertebral arteries arise from the subclavian arteries. Left vertebral artery dominant. Right vertebral artery diffusely hypoplastic. Focal plaque near the origin of the left vertebral artery without flow-limiting stenosis. Vertebral arteries patent within the neck without stenosis, dissection, or occlusion. Skeleton: No acute osseous abnormality. No worrisome lytic or blastic osseous lesions. Moderate degenerative spondylolysis present at C5-6 and C6-7. Other neck: Soft tissues of the neck demonstrate no acute abnormality. No adenopathy. Upper chest: Visualized upper mediastinum within normal limits. Visualized lungs are clear. Review of the MIP images confirms the above findings CTA HEAD FINDINGS Anterior circulation: Petrous segments widely patent. Scattered atheromatous plaque throughout the cavernous/ supraclinoid ICAs with mild to moderate diffuse narrowing. Right A1 segment patent. Moderate narrowing at the origin of  the left A1 segment. Anterior communicating artery patent. Anterior cerebral arteries demonstrate multifocal atheromatous irregularity. There are superimposed severe stenoses (series 7, image 83 on the right, in 01/1983 on the left). PCAs are patent to their distal aspects. M1 segments demonstrate mild atheromatous irregularity without flow-limiting stenosis or occlusion. There is a saccular aneurysm measuring 4 x 3 x 4 mm arising from the left MCA bifurcation (series 7, image 110). No proximal M2 occlusion. Multifocal atheromatous irregularity throughout the MCA branches bilaterally. Posterior circulation: Dominant left vertebral artery widely patent to the vertebrobasilar junction. Diminutive right vertebral artery patent as well. Posterior inferior cerebral arteries patent bilaterally. Basilar artery demonstrates mild multifocal atheromatous irregularity without flow-limiting stenosis. Superior cerebral arteries patent bilaterally. PCAs primarily supplied via the basilar artery. Multifocal atheromatous regularity within the left P1 segment with moderate stenoses. Additional atheromatous irregularity within the PCAs distally. There are associated multifocal moderate to severe stenoses within the right P2 segment, which is somewhat attenuated as compared to the left. PCAs are supplied to their distal aspects. Venous sinuses: Patent. Anatomic variants: No significant anatomic variant. Delayed phase: Not performed. Review of the MIP images confirms the above findings IMPRESSION: 1. Negative CTA for emergent large vessel occlusion. Evolving left pontine infarct stable from previous CT. 2. 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 3. Moderate atheromatous disease involving the anterior and posterior circulations as above, most notable within the distal ACAs and right PCA. 4. 4 x 3 x 4 mm left MCA bifurcation  aneurysm. Critical Value/emergent  results were called by telephone at the time of interpretation on 06/27/2016 at 9:07 pm to Dr. Roland Rack , who verbally acknowledged these results. Electronically Signed   By: Jeannine Boga M.D.   On: 06/27/2016 21:10   Mr Jodene Nam Head Wo Contrast  Result Date: 06/28/2016 CLINICAL DATA:  66 y/o F; status post fall, right hemi paresis, a aphasia. EXAM: MRI HEAD WITHOUT CONTRAST MRA HEAD WITHOUT CONTRAST TECHNIQUE: Multiplanar, multiecho pulse sequences of the brain and surrounding structures were obtained without intravenous contrast. Angiographic images of the head were obtained using MRA technique without contrast. COMPARISON:  CT head and CT angiogram head and neck dated 06/27/2016. FINDINGS: MRI HEAD FINDINGS Brain: Left paramedian pons focus of acute diffusion restriction similar in distribution to hypodensity on CT compatible with acute infarction. Few foci of T2 FLAIR hyperintensity in subcortical and periventricular white matter compatible with mild chronic microvascular ischemic changes. Mild diffuse brain parenchymal volume loss. Small chronic lacunar infarct within left caudate body. Single punctate focus of susceptibility hypointensity within the left temporal lobe is compatible with hemosiderin deposition of old microhemorrhage. No focal mass effect, extra-axial collection, or hydrocephalus. Vascular: As below. Skull and upper cervical spine: Normal marrow signal. Sinuses/Orbits: Negative. Other: None. MRA HEAD FINDINGS Internal carotid arteries: Prominent infundibular origin of right anterior choroidal artery. Left MCA bifurcation 3 x 4 mm aneurysm as seen on prior CT angiogram (series 6, image 101). Anterior cerebral arteries:  Patent. Middle cerebral arteries: Patent. Anterior communicating artery: Patent. Posterior communicating arteries:  Patent. Posterior cerebral arteries:  Patent. Basilar artery:  Patent. Vertebral arteries:  Patent. No large vessel occlusion or high-grade  stenosis. Intracranial atherosclerosis with multiple segments of mild stenosis in the anterior and posterior circulation most pronounced and proximal PCA and left proximal A1. IMPRESSION: 1. Left paramedian pontine acute infarction is stable in distribution comparing with prior CT given differences in technique. No hemorrhage. 2. Background of mild chronic microvascular ischemic changes and parenchymal volume loss of the brain. 3. No large vessel occlusion or high-grade stenosis of the circle of Willis. 4. Intracranial atherosclerosis with multiple areas of mild stenosis most pronounced in proximal PCA and left proximal A1 segments. 5. 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   On: 06/28/2016 04:38   Mr Brain Wo Contrast  Result Date: 06/28/2016 CLINICAL DATA:  66 y/o F; status post fall, right hemi paresis, a aphasia. EXAM: MRI HEAD WITHOUT CONTRAST MRA HEAD WITHOUT CONTRAST TECHNIQUE: Multiplanar, multiecho pulse sequences of the brain and surrounding structures were obtained without intravenous contrast. Angiographic images of the head were obtained using MRA technique without contrast. COMPARISON:  CT head and CT angiogram head and neck dated 06/27/2016. FINDINGS: MRI HEAD FINDINGS Brain: Left paramedian pons focus of acute diffusion restriction similar in distribution to hypodensity on CT compatible with acute infarction. Few foci of T2 FLAIR hyperintensity in subcortical and periventricular white matter compatible with mild chronic microvascular ischemic changes. Mild diffuse brain parenchymal volume loss. Small chronic lacunar infarct within left caudate body. Single punctate focus of susceptibility hypointensity within the left temporal lobe is compatible with hemosiderin deposition of old microhemorrhage. No focal mass effect, extra-axial collection, or hydrocephalus. Vascular: As below. Skull and upper cervical spine: Normal marrow signal.  Sinuses/Orbits: Negative. Other: None. MRA HEAD FINDINGS Internal carotid arteries: Prominent infundibular origin of right anterior choroidal artery. Left MCA bifurcation 3 x 4 mm aneurysm as seen on prior CT  angiogram (series 6, image 101). Anterior cerebral arteries:  Patent. Middle cerebral arteries: Patent. Anterior communicating artery: Patent. Posterior communicating arteries:  Patent. Posterior cerebral arteries:  Patent. Basilar artery:  Patent. Vertebral arteries:  Patent. No large vessel occlusion or high-grade stenosis. Intracranial atherosclerosis with multiple segments of mild stenosis in the anterior and posterior circulation most pronounced and proximal PCA and left proximal A1. IMPRESSION: 1. Left paramedian pontine acute infarction is stable in distribution comparing with prior CT given differences in technique. No hemorrhage. 2. Background of mild chronic microvascular ischemic changes and parenchymal volume loss of the brain. 3. No large vessel occlusion or high-grade stenosis of the circle of Willis. 4. Intracranial atherosclerosis with multiple areas of mild stenosis most pronounced in proximal PCA and left proximal A1 segments. 5. 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   On: 06/28/2016 04:38   Ct Head Code Stroke W/o Cm  Result Date: 06/27/2016 CLINICAL DATA:  Code stroke. Initial evaluation for acute right-sided weakness. EXAM: CT HEAD WITHOUT CONTRAST TECHNIQUE: Contiguous axial images were obtained from the base of the skull through the vertex without intravenous contrast. COMPARISON:  None. FINDINGS: Brain: Age-related cerebral atrophy with mild chronic small vessel ischemic disease. No acute intracranial hemorrhage. There is a subtle hypodensity measuring approximately 16 mm within the left paramedian pons, suspicious for evolving acute ischemic infarct (series 2, image 8). No associated mass effect. No other evidence for  acute large vessel territory infarct. Gray-white matter differentiation otherwise maintained. No mass lesion, midline shift, or mass effect. No hydrocephalus. No extra-axial fluid collection. Vascular: No hyperdense vessel. Scattered vascular calcifications present within the carotid siphons. Skull: Scalp soft tissues within normal limits.  Calvarium intact. Sinuses/Orbits: Globes and orbital soft tissues within normal limits. Scattered mucosal thickening within the ethmoidal air cells. Paranasal sinuses are otherwise clear. No mastoid effusion. ASPECTS [facility] Stroke Program Early CT Score) - Ganglionic level infarction (caudate, lentiform nuclei, internal capsule, insula, M1-M3 cortex): 7 - Supraganglionic infarction (M4-M6 cortex): 3 Total score (0-10 with 10 being normal): 10 IMPRESSION: 1. Focal hypodensity within the left paramedian pons, suspicious for evolving acute ischemic infarct. 2. ASPECTS is 10 3. Age-related cerebral atrophy with mild chronic small vessel ischemic disease. Critical Value/emergent results were called by telephone at the time of interpretation on 06/27/2016 at 8:27 pm to Dr. Kathrynn Speed, who verbally acknowledged these results. Electronically Signed   By: Jeannine Boga M.D.   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 Does the need for close, 24 hr/day medical supervision in concert with the patient's rehab needs make it unreasonable for this patient to be served in a less intensive setting? Yes 2. Co-Morbidities requiring supervision/potential complications: htn, dm 3. Due to bladder management, bowel management, safety, skin/wound care, disease management, medication administration, pain management and patient education, does the patient require 24 hr/day rehab nursing? Yes 4. Does the patient require coordinated care of a physician, rehab nurse, PT (1-2 hrs/day, 5 days/week), OT (1-2 hrs/day,  5 days/week) and SLP (1-2 hrs/day, 5 days/week) to address physical and functional deficits in the context of the above medical diagnosis(es)? Yes Addressing deficits in the following areas: balance, endurance, locomotion, strength, transferring, bowel/bladder control, bathing, dressing, feeding, grooming, toileting, speech and psychosocial support 5. Can the patient actively participate in an intensive therapy program of at least 3 hrs of therapy per day at least 5 days per  week? Yes 6. The potential for patient to make measurable gains while on inpatient rehab is excellent 7. Anticipated functional outcomes upon discharge from inpatient rehab are modified independent  with PT, modified independent with OT, modified independent with SLP. 8. Estimated rehab length of stay to reach the above functional goals is: 7 days 9. Does the patient have adequate social supports and living environment to accommodate these discharge functional goals? Yes 10. Anticipated D/C setting: Home 11. Anticipated post D/C treatments: HH therapy and Outpatient therapy 12. Overall Rehab/Functional Prognosis: excellent  RECOMMENDATIONS: This patient's condition is appropriate for continued rehabilitative care in the following setting: CIR Patient has agreed to participate in recommended program. Yes Note that insurance prior authorization may be required for reimbursement for recommended care.  Comment: Rehab Admissions Coordinator to follow up.  Thanks,  [name], MD, Mellody Drown    Cathlyn Parsons., PA-C 06/29/2016

## 2016-06-29 NOTE — Evaluation (Signed)
Occupational Therapy Evaluation Patient Details Name: Deborah Wise MRN: [id number] DOB: 01-21-1951 Today's Date: 06/29/2016    History of Present Illness 66 y.o. female presenting with right sided weakness. PMH is significant for hypertension, diabetes, hepatitis C, liver cirrhosis, alcohol use disorder and thrombocytopenia. Imaging revealed Acute CVA, L pontine infarct.   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 Pt presents with R side weakness, impaired standing balance, slurred speech and decreased safety/awareness of deficits. Pt lives alone and needs to be independent to return home. Recommending intensive, short term rehab in CIR prior to discharge. Will follow acutely.    Follow Up Recommendations  CIR    Equipment Recommendations  Tub/shower seat    Recommendations for Other Services       Precautions / Restrictions Precautions Precautions: Fall      Mobility Bed Mobility               General bed mobility comments: received OOB  Transfers Overall transfer level: Needs assistance Equipment used: None Transfers: Sit to/from Stand Sit to Stand: Min guard         General transfer comment: min guard assist for stability in elevation to standing    Balance Overall balance assessment: Needs assistance;History of Falls Sitting-balance support: Feet supported Sitting balance-Leahy Scale: Good     Standing balance support: During functional activity     Single Leg Stance - Right Leg:  (can not perform without UE support) Single Leg Stance - Left Leg: 10                        ADL Overall ADL's : Needs assistance/impaired Eating/Feeding: Modified independent;Sitting   Grooming: Brushing hair;Oral care;Standing;Min guard Grooming Details (indicate cue type and reason): pt able to open containers and apply toothpaste to toothbrush Upper Body Bathing: Set up;Sitting   Lower  Body Bathing: Minimal assistance;Sit to/from stand Lower Body Bathing Details (indicate cue type and reason): assist for balance Upper Body Dressing : Minimal assistance;Sitting   Lower Body Dressing: Minimal assistance;Sit to/from stand Lower Body Dressing Details (indicate cue type and reason): able to don and doff socks, min assist for balance with standing Toilet Transfer: Minimal assistance;Ambulation   Toileting- Clothing Manipulation and Hygiene: Minimal assistance;Sit to/from stand       Functional mobility during ADLs: Minimal assistance       Vision Additional Comments: pt reports needing glasses   Perception     Praxis      Pertinent Vitals/Pain Pain Assessment: No/denies pain     Hand Dominance Right   Extremity/Trunk Assessment Upper Extremity Assessment Upper Extremity Assessment: RUE deficits/detail RUE Deficits / Details: 3-/5 shoulder, 3+/5 elbow to hand RUE Coordination: decreased gross motor;decreased fine motor   Lower Extremity Assessment Lower Extremity Assessment: Defer to PT evaluation        Communication Communication Communication: Expressive difficulties (slurred speech)   Cognition Arousal/Alertness: Awake/alert Behavior During Therapy: Impulsive Overall Cognitive Status: Impaired/Different from baseline Area of Impairment: Safety/judgement         Safety/Judgement: Decreased awareness of safety;Decreased awareness of deficits     General Comments: pt attempting to walk with lines attached to monitor   General Comments       Exercises       Shoulder Instructions      Home Living Family/patient expects to be discharged to:: Private residence Living Arrangements: Alone Available Help at Discharge: Family;Available PRN/intermittently  Type of Home: Apartment Home Access: Stairs to enter [employer] of Steps: flight Entrance Stairs-Rails: Can reach both Home Layout: One level     Bathroom Shower/Tub:  Teacher, early years/pre: Standard     Home Equipment: Cane - single point          Prior Functioning/Environment Level of Independence: Independent        Comments: 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         OT Problem List: Decreased strength;Decreased activity tolerance;Impaired balance (sitting and/or standing);Decreased coordination;Decreased cognition;Decreased safety awareness;Decreased knowledge of use of DME or AE;Impaired UE functional use   OT Treatment/Interventions: Self-care/ADL training;Therapeutic exercise;Neuromuscular education;DME and/or AE instruction;Therapeutic activities;Cognitive remediation/compensation;Patient/family education;Balance training    OT Goals(Current goals can be found in the care plan section) Acute Rehab OT Goals Patient Stated Goal: to get back to normal OT Goal Formulation: With patient Time For Goal Achievement: 07/13/16 Potential to Achieve Goals: Good ADL Goals Pt Will Perform Grooming: with modified independence;standing Pt Will Perform Upper Body Bathing: with modified independence;standing;sitting Pt Will Perform Lower Body Bathing: with modified independence;sit to/from stand Pt Will Transfer to Toilet: with modified independence;ambulating;regular height toilet Pt Will Perform Toileting - Clothing Manipulation and hygiene: with modified independence;sit to/from stand Pt Will Perform Tub/Shower Transfer: Tub transfer;with modified independence;ambulating;shower seat Additional ADL Goal #1: Pt will demonstrate awareness of safety during mobility and ADL.  OT Frequency: Min 3X/week   Barriers to D/C: Decreased caregiver support          Co-evaluation              End of Session Nurse Communication: Mobility status (impaired safety)  Activity Tolerance: Patient tolerated treatment well Patient left: in chair;with call bell/phone within reach;with chair alarm set   Time: 1004-1020 OT  Time Calculation (min): 16 min Charges:  OT General Charges $OT Visit: 1 Procedure OT Evaluation $OT Eval Moderate Complexity: 1 Procedure G-Codes:    Deborah Wise 06/29/2016, 11:24 AM  [phone]

## 2016-06-29 NOTE — Progress Notes (Signed)
*  PRELIMINARY RESULTS* Vascular Ultrasound Carotid Duplex (Doppler) has been completed.  Preliminary findings: Right 60-79% ICA stenosis. Left 1-39% ICA stenosis. Calcific plaque at bilateral ICA origin may obscure higher velocities.   Landry Mellow, RDMS, RVT  06/29/2016, 9:39 AM

## 2016-06-30 ENCOUNTER — Inpatient Hospital Stay (HOSPITAL_COMMUNITY): Payer: Medicaid Other

## 2016-06-30 ENCOUNTER — Telehealth: Payer: Self-pay | Admitting: Surgery

## 2016-06-30 DIAGNOSIS — I671 Cerebral aneurysm, nonruptured: Secondary | ICD-10-CM

## 2016-06-30 HISTORY — PX: IR GENERIC HISTORICAL: IMG1180011

## 2016-06-30 LAB — VAS US CAROTID
LEFT ECA DIAS: -9 cm/s
LEFT VERTEBRAL DIAS: 18 cm/s
Left CCA dist dias: -20 cm/s
Left CCA dist sys: -95 cm/s
Left CCA prox dias: 29 cm/s
Left CCA prox sys: 143 cm/s
Left ICA dist dias: -22 cm/s
Left ICA dist sys: -101 cm/s
Left ICA prox dias: -20 cm/s
Left ICA prox sys: -89 cm/s
RIGHT ECA DIAS: 192 cm/s
RIGHT VERTEBRAL DIAS: 24 cm/s
Right CCA prox dias: 9 cm/s
Right CCA prox sys: 75 cm/s
Right cca dist sys: -128 cm/s

## 2016-06-30 LAB — BASIC METABOLIC PANEL
Anion gap: 6 (ref 5–15)
BUN: 13 mg/dL (ref 6–20)
CO2: 23 mmol/L (ref 22–32)
Calcium: 9.4 mg/dL (ref 8.9–10.3)
Chloride: 109 mmol/L (ref 101–111)
Creatinine, Ser: 0.7 mg/dL (ref 0.44–1.00)
GFR calc Af Amer: >60 mL/min (ref >60)
GFR calc non Af Amer: >60 mL/min (ref >60)
Glucose, Bld: 138 mg/dL — ABNORMAL HIGH (ref 65–99)
Potassium: 4.4 mmol/L (ref 3.5–5.1)
Sodium: 138 mmol/L (ref 135–145)

## 2016-06-30 MED ORDER — LIDOCAINE HCL 1 % IJ SOLN
INTRAMUSCULAR | Status: AC | PRN
  Administered 2016-06-30: 10 mL

## 2016-06-30 MED ORDER — HYDRALAZINE HCL 20 MG/ML IJ SOLN
INTRAMUSCULAR | Status: AC
  Filled 2016-06-30: qty 1

## 2016-06-30 MED ORDER — SODIUM CHLORIDE 0.9 % IV SOLN
INTRAVENOUS | Status: AC
  Administered 2016-06-30: 16:00:00 via INTRAVENOUS

## 2016-06-30 MED ORDER — HYDRALAZINE HCL 20 MG/ML IJ SOLN
INTRAMUSCULAR | Status: AC | PRN
  Administered 2016-06-30: 5 mg via INTRAVENOUS

## 2016-06-30 MED ORDER — IOPAMIDOL (ISOVUE-300) INJECTION 61%
INTRAVENOUS | Status: AC
  Administered 2016-06-30: 25 mL
  Filled 2016-06-30: qty 100

## 2016-06-30 MED ORDER — MIDAZOLAM HCL 2 MG/2ML IJ SOLN
INTRAMUSCULAR | Status: AC
  Filled 2016-06-30: qty 2

## 2016-06-30 MED ORDER — IOPAMIDOL (ISOVUE-300) INJECTION 61%
INTRAVENOUS | Status: AC
  Administered 2016-06-30: 75 mL
  Filled 2016-06-30: qty 150

## 2016-06-30 MED ORDER — FENTANYL CITRATE (PF) 100 MCG/2ML IJ SOLN
INTRAMUSCULAR | Status: AC
  Filled 2016-06-30: qty 2

## 2016-06-30 MED ORDER — HEPARIN SODIUM (PORCINE) 1000 UNIT/ML IJ SOLN
INTRAMUSCULAR | Status: AC | PRN
  Administered 2016-06-30: 1000 [IU] via INTRAVENOUS

## 2016-06-30 MED ORDER — HEPARIN SODIUM (PORCINE) 1000 UNIT/ML IJ SOLN
INTRAMUSCULAR | Status: AC
  Filled 2016-06-30: qty 2

## 2016-06-30 MED ORDER — FENTANYL CITRATE (PF) 100 MCG/2ML IJ SOLN
INTRAMUSCULAR | Status: AC | PRN
  Administered 2016-06-30: 25 ug via INTRAVENOUS

## 2016-06-30 MED ORDER — HYDROCHLOROTHIAZIDE 25 MG PO TABS
25.0000 mg | ORAL_TABLET | Freq: Every day | ORAL | Status: DC
  Administered 2016-06-30 – 2016-07-01 (×2): 25 mg via ORAL
  Filled 2016-06-30 (×3): qty 1

## 2016-06-30 MED ORDER — LIDOCAINE HCL 1 % IJ SOLN
INTRAMUSCULAR | Status: AC
  Filled 2016-06-30: qty 20

## 2016-06-30 NOTE — H&P (Signed)
Physical Medicine and Rehabilitation Admission H&P    Chief Complaint  Patient presents with  . Code Stroke  : HPI: Deborah Wise is a 66 y.o. right handed female with history of diabetes mellitus, hypertension, hepatitis C, alcohol abuse with liver cirrhosis and thrombocytopenia. Per chart review patient lives alone independently with occasional prior to admission. One level home. 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 She has a boyfriend that can assist. Presented 06/27/2016 with right-sided weakness, slurred speech and fall. Urine drug screen positive for cocaine. Cranial CT scan showed focal hypodensity within the left paramedian pons, suspicious for evolving acute ischemic infarct. Patient did not receive TPA. CT angiogram head and neck negative for emergent large vessel occlusion. 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 MRI 06/28/2016 with left paramedian pontine acute infarct stable in comparison to prior CT. No hemorrhage. Echocardiogram with ejection fraction 73% grade 1 diastolic dysfunction. No regional wall motion abnormalities. Carotid Dopplers with right 60-79% ICA stenosis.. Diagnostic angiogram 06/30/2016 per interventional radiology again identified right carotid stenosis measuring 60%. Plan is to follow-up outpatient vascular surgery Dr. Trula Slade in 6 weeks with repeat carotid duplex. Aspirin for CVA prophylaxis. Subcutaneous Lovenox for DVT prophylaxis. Tolerating a regular diet. Physical and occupational therapy evaluations completed with recommendations of physical medicine rehabilitation consult.Patient was admitted for comprehensive rehabilitation program  Review of Systems  Constitutional: Negative for chills and fever.  HENT: Negative for hearing loss and tinnitus.   Eyes: Negative for blurred vision and double vision.  Respiratory: Positive for cough. Negative for  shortness of breath.   Cardiovascular: Positive for leg swelling. Negative for chest pain and palpitations.  Gastrointestinal: Positive for constipation. Negative for nausea and vomiting.       GERD  Genitourinary: Negative for flank pain and hematuria.       Occasional stress incontinence  Musculoskeletal: Positive for myalgias.  Skin: Negative for rash.  Neurological: Positive for dizziness, speech change and weakness. Negative for seizures.   Past Medical History:  Diagnosis Date  . Arthritis   . Diabetes mellitus without complication ([place])   . Fibroid, uterine   . GERD (gastroesophageal reflux disease)   . Hepatitis    hep C  . Hypertension   . Thrombocytopenia ([place]) 12/25/2014  . Urinary incontinence    Patient noticed mild and is not currently a significant problem   Past Surgical History:  Procedure Laterality Date  . ANKLE SURGERY    . broken ankle    . MULTIPLE EXTRACTIONS WITH ALVEOLOPLASTY  10/25/2011   Procedure: MULTIPLE EXTRACION WITH ALVEOLOPLASTY;  Surgeon: Gae Bon, DDS;  Location: [facility];  Service: Oral Surgery;  Laterality: Bilateral;  Extract teeth numbers one, two, six, seven, eight, nine, ten, eleven, twelve, fifteen, sixteen, eighteen, nineteen, twenty-three, twenty-four, twenty-five, twenty-seven, thirty-two and alveoplasty.   Family History  Problem Relation Age of Onset  . Diabetes type II Mother   . Hypertension Mother   . Cancer Father     patient does not know what type of cancer   Social History:  reports that she has never smoked. She has never used smokeless tobacco. She reports that she drinks about 0.6 oz of alcohol per week . She reports that she does not use drugs. Allergies: No Known Allergies Medications Prior to Admission  Medication Sig Dispense Refill  . amLODipine (NORVASC) 10 MG tablet Take 1  tablet (10 mg total) by mouth daily. 30 tablet 0  . Oxycodone HCl 10 MG TABS Take 10 mg by mouth 3 (three) times daily as needed for pain.   0  . ACCU-CHEK AVIVA PLUS test strip USE TO CHECK BLOOD SUGAR TWICE A DAY  3  . glucose monitoring kit (FREESTYLE) monitoring kit 1 each by Does not apply route 4 (four) times daily - after meals and at bedtime. 1 month Diabetic Testing Supplies for QAC-QHS accuchecks. Any brand OK 1 each 1  . metFORMIN (GLUCOPHAGE) 500 MG tablet Take 500 mg by mouth daily with breakfast.     . oxyCODONE (ROXICODONE) 5 MG immediate release tablet Take 1 tablet (5 mg total) by mouth every 4 (four) hours as needed for severe pain. (Patient not taking: Reported on 06/27/2016) 8 tablet 0  . thiamine (VITAMIN B-1) 100 MG tablet Take 1 tablet (100 mg total) by mouth daily. (Patient not taking: Reported on 06/27/2016) 30 tablet 0    Home: Home Living Family/patient expects to be discharged to:: Private residence Living Arrangements: Alone Available Help at Discharge: Family, Available PRN/intermittently Type of Home: Apartment Home Access: Stairs to enter Technical brewer of Steps: flight Entrance Stairs-Rails: Can reach both Home Layout: One level Bathroom Shower/Tub: Chiropodist: Standard Home Equipment: Cane - single point   Functional History: Prior Function Level of Independence: Independent Comments: 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   Functional Status:  Mobility: Bed Mobility General bed mobility comments: received OOB Transfers Overall transfer level: Needs assistance Equipment used: None Transfers: Sit to/from Stand Sit to Stand: Min guard General transfer comment: min guard assist for stability in elevation to standing Ambulation/Gait Ambulation/Gait assistance: Min assist Ambulation Distance (Feet): 90 Feet Assistive device: 1 person hand held assist Gait Pattern/deviations: Step-through pattern, Decreased stride length, Decreased stance time - right, Narrow base of support General Gait Details: patient with some instability during  ambulation, ocassional right leg lag noted with increased instability. Multiple balance checks with min assist for stability Gait velocity: decreased Gait velocity interpretation: Below normal speed for age/gender    ADL: ADL Overall ADL's : Needs assistance/impaired Eating/Feeding: Modified independent, Sitting Grooming: Brushing hair, Oral care, Standing, Min guard Grooming Details (indicate cue type and reason): pt able to open containers and apply toothpaste to toothbrush Upper Body Bathing: Set up, Sitting Lower Body Bathing: Minimal assistance, Sit to/from stand Lower Body Bathing Details (indicate cue type and reason): assist for balance Upper Body Dressing : Minimal assistance, Sitting Lower Body Dressing: Minimal assistance, Sit to/from stand Lower Body Dressing Details (indicate cue type and reason): able to don and doff socks, min assist for balance with standing Toilet Transfer: Minimal assistance, Ambulation Toileting- Clothing Manipulation and Hygiene: Minimal assistance, Sit to/from stand Functional mobility during ADLs: Minimal assistance  Cognition: Cognition Overall Cognitive Status: Impaired/Different from baseline Orientation Level: Oriented X4 Cognition Arousal/Alertness: Awake/alert Behavior During Therapy: Impulsive Overall Cognitive Status: Impaired/Different from baseline Area of Impairment: Safety/judgement Safety/Judgement: Decreased awareness of safety, Decreased awareness of deficits General Comments: pt attempting to walk with lines attached to monitor  Physical Exam: Blood pressure (!) 158/68, pulse 66, temperature 98.4 F (36.9 C), temperature source Oral, resp. rate 20, height 5' 5" (1.651 m), weight 74 kg (163 lb 2.3 oz), SpO2 100 %. Physical Exam  Vitals reviewed. Constitutional: She appears well-developed. No distress.  HENT:  Head: Normocephalic.  Right Ear: External ear normal.  Left Ear: External ear normal.  Eyes:  EOM are normal.  Pupils are equal, round, and reactive to light. Left eye exhibits no discharge.  Pupils reactive to light  Neck: Normal range of motion. Neck supple. No tracheal deviation present. No thyromegaly present.  Cardiovascular: Normal rate and regular rhythm.  Exam reveals friction rub.   No murmur heard. Respiratory: Effort normal and breath sounds normal. No respiratory distress. She has no wheezes.  GI: Soft. Bowel sounds are normal. She exhibits no distension.  Skin: Skin is warm and dry. No rash noted. No erythema.  Psychiatric: She has a normal mood and affect. Her behavior is normal. Thought content normal.  Skin. Warm and dry Neurological: She is alert.  Makes good eye contact with examiner. Follows simple commands. She can provide her name and age and date of birth. Fair awareness of deficits. Right central 7 and tongue deviation present. Has good cough.  Speech remains quite dysarthric. RUE: 3 to 3+/5 deltoid, biceps, triceps, wrist, HI  with poor fine motor coordination. + pronator drift. RLE: 3+HF, KE and 4/5 ADF/PF with decreased coordination.   Results for orders placed or performed during the hospital encounter of 06/27/16 (from the past 48 hour(s))  MRSA PCR Screening     Status: None   Collection Time: 06/28/16  4:11 PM  Result Value Ref Range   MRSA by PCR NEGATIVE NEGATIVE    Comment:        The GeneXpert MRSA Assay (FDA approved for NASAL specimens only), is one component of a comprehensive MRSA colonization surveillance program. It is not intended to diagnose MRSA infection nor to guide or monitor treatment for MRSA infections.   CBC     Status: Abnormal   Collection Time: 06/29/16  2:56 AM  Result Value Ref Range   WBC 6.2 4.0 - 10.5 K/uL   RBC 4.06 3.87 - 5.11 MIL/uL   Hemoglobin 10.0 (L) 12.0 - 15.0 g/dL   HCT 30.9 (L) 36.0 - 46.0 %   MCV 76.1 (L) 78.0 - 100.0 fL   MCH 24.6 (L) 26.0 - 34.0 pg   MCHC 32.4 30.0 - 36.0 g/dL   RDW 16.4 (H) 11.5 - 15.5 %    Platelets 95 (L) 150 - 400 K/uL    Comment: CONSISTENT WITH PREVIOUS RESULT  Basic metabolic panel     Status: Abnormal   Collection Time: 06/29/16  2:56 AM  Result Value Ref Range   Sodium 137 135 - 145 mmol/L   Potassium 3.3 (L) 3.5 - 5.1 mmol/L   Chloride 106 101 - 111 mmol/L   CO2 25 22 - 32 mmol/L   Glucose, Bld 119 (H) 65 - 99 mg/dL   BUN 14 6 - 20 mg/dL   Creatinine, Ser 0.75 0.44 - 1.00 mg/dL   Calcium 9.3 8.9 - 10.3 mg/dL   GFR calc non Af Amer >60 >60 mL/min   GFR calc Af Amer >60 >60 mL/min    Comment: (NOTE) The eGFR has been calculated using the CKD EPI equation. This calculation has not been validated in all clinical situations. eGFR's persistently <60 mL/min signify possible Chronic Kidney Disease.    Anion gap 6 5 - 15  Glucose, capillary     Status: Abnormal   Collection Time: 06/29/16  9:08 PM  Result Value Ref Range   Glucose-Capillary 189 (H) 65 - 99 mg/dL  Basic metabolic panel     Status: Abnormal   Collection Time: 06/30/16  3:26 AM  Result Value Ref Range   Sodium 138  135 - 145 mmol/L   Potassium 4.4 3.5 - 5.1 mmol/L    Comment: DELTA CHECK NOTED   Chloride 109 101 - 111 mmol/L   CO2 23 22 - 32 mmol/L   Glucose, Bld 138 (H) 65 - 99 mg/dL   BUN 13 6 - 20 mg/dL   Creatinine, Ser 0.70 0.44 - 1.00 mg/dL   Calcium 9.4 8.9 - 10.3 mg/dL   GFR calc non Af Amer >60 >60 mL/min   GFR calc Af Amer >60 >60 mL/min    Comment: (NOTE) The eGFR has been calculated using the CKD EPI equation. This calculation has not been validated in all clinical situations. eGFR's persistently <60 mL/min signify possible Chronic Kidney Disease.    Anion gap 6 5 - 15   No results found.     Medical Problem List and Plan: 1.  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 Continue aspirin therapy  -admit to inpatient rehab 2.  DVT Prophylaxis/Anticoagulation: Subcutaneous Lovenox. Monitor platelet counts and  any signs of bleeding 3. Pain Management: Tylenol as needed 4. Mood: Provide emotional support 5. Neuropsych: This patient is capable of making decisions on her own behalf. 6. Skin/Wound Care: Routine skin checks 7. Fluids/Electrolytes/Nutrition: Routine I&O with follow-up chemistries during admission 8. Right 60-79% ICA stenosis. Follow-up outpatient vascular surgery Dr. Trula Slade 6 weeks 9. Hypertension. Norvasc 10 mg daily, HCTZ 25 mg daily. Monitor with increased mobility 10. Hyperlipidemia. Lipitor 11. UDS positive for cocaine with history also of alcohol abuse. Provide counseling 12. History hepatitis C 13. Liver cirrhosis with thrombocytopenia related to alcohol abuse. Follow-up chemistries  Post Admission Physician Evaluation: 1. Functional deficits secondary  to left pontine infarct. 2. Patient is admitted to receive collaborative, interdisciplinary care between the physiatrist, rehab nursing staff, and therapy team. 3. Patient's level of medical complexity and substantial therapy needs in context of that medical necessity cannot be provided at a lesser intensity of care such as a SNF. 4. Patient has experienced substantial functional loss from his/her baseline which was documented above under the "Functional History" and "Functional Status" headings.  Judging by the patient's diagnosis, physical exam, and functional history, the patient has potential for functional progress which will result in measurable gains while on inpatient rehab.  These gains will be of substantial and practical use upon discharge  in facilitating mobility and self-care at the household level. 5. Physiatrist will provide 24 hour management of medical needs as well as oversight of the therapy plan/treatment and provide guidance as appropriate regarding the interaction of the two. 6. The Preadmission Screening has been reviewed and patient status is unchanged unless otherwise stated above. 7. 24 hour rehab nursing  will assist with bladder management, bowel management, safety, skin/wound care, disease management, medication administration, pain management and patient education  and help integrate therapy concepts, techniques,education, etc. 8. PT will assess and treat for/with: Lower extremity strength, range of motion, stamina, balance, functional mobility, safety, adaptive techniques and equipment, NMR, ego support, stroke education, community reintegration.   Goals are: mod I. 9. OT will assess and treat for/with: ADL's, functional mobility, safety, upper extremity strength, adaptive techniques and equipment, NMR, community reintegration, stroke education.   Goals are: mod I. Therapy may proceed with showering this patient. 10. SLP will assess and treat for/with: speech, communication, education.  Goals are: mod I. 11. Case Management and Social Worker will assess and treat for psychological issues and discharge planning. 12. Team conference will  be held weekly to assess progress toward goals and to determine barriers to discharge. 13. Patient will receive at least 3 hours of therapy per day at least 5 days per week. 14. ELOS: 7-8 days       15. Prognosis:  excellent     [name], MD, [facility] 07/02/2016  Cathlyn Parsons., PA-C 06/30/2016

## 2016-06-30 NOTE — Telephone Encounter (Signed)
NO VM set up will mail lttr for appt 3/5 at 11 am

## 2016-06-30 NOTE — Progress Notes (Addendum)
Inpatient Rehabilitation  Met with patient to discuss team's recommendation for IP Rehab.  Shared booklets and answered questions.  Patient reported being open to getting therapy for her speech and right sided weakness prior to discharging home with her boyfriend, Marcello Moores. Note plans for cerebral angiogram today to further evaluate aneurysm and area of CVA.  Also discussed that bed offer depends on timing of medical stability along with CIR bed availability.  Plan to follow along.  Please call with questions.    Carmelia Roller., CCC/SLP Admission Coordinator  [place]  Cell [phone]

## 2016-06-30 NOTE — Telephone Encounter (Signed)
-----   Message from Mena Goes, RN sent at 06/30/2016  9:17 AM EST ----- Regarding: schedule   ----- Message ----- From: Serafina Mitchell, MD Sent: 06/29/2016   8:55 PM To: [facility] Charge Pool  06-29-2016:  Level 4 consult.  Schedule for outpatient follow up to discuss right CEA in 4-6 weeks

## 2016-06-30 NOTE — Progress Notes (Signed)
Family Medicine Teaching Service Daily Progress Note Intern Pager: [phone]  Patient name: Deborah Wise Medical record number: [id number] Date of birth: 04-14-1951 Age: 66 y.o. Gender: female  Primary Care Provider: Pcp Not In System Consultants: Neurology Code Status: Full  Pt Overview and Major Events to Date:  1/21 Admitted for stroke  Assessment and Plan: Deborah Wise is a 66 y.o. female presenting with right sided weakness. PMH is significant for hypertension, diabetes, hepatitis C, liver cirrhosis, alcohol use disorder and thrombocytopenia  Acute CVA, L pontine infarct, improving Presented with aphasia, R facial droop, pronator drift and multiple falls 2/2 R hemiparesis over past 3 days. CT and CTA showed left paramedian pons acute infarct.   MRI/MRA showed L paramedian pontine acute infarction with intracranial atherosclerosis with multiple areas of mild stenosis most pronounced in proximal PCA and left proximal A1 segments. Was not a candidate for tPA given presented outside window. Patient's risk factors for CVA are diabetes, hypertension and age. Patient was not compliant with antihypertensive medication and diabetic medications. EKG showed no acute ST changes and troponin <0.03 -Neurology stroke team following             -DC plavix due to cerebral aneurysm. Continue aspirin 325 mg daily             -Echo EF 60-65%, G1DD  -carotid dopplers prelim: Right 60-79% ICA stenosis. Left 1-39% ICA stenosis. -Risk stratification labs: LDL 128, TSH 3.314, A1c 5.5 -Neurovascular check q4hrs -Cardiac monitor -Normalizing BP in setting of cerebral aneurysm -Atorvastatin 80mg  qd -Fall precautions  -PT/OT recommended CIR, and per PM&R is appropriate, now awaiting CIR placement   Hypertension: BP 158/68 this am Not compliant with home amlodipine. Has been on HCTZ in the past. Per stroke team, start normalizing BP in setting of acute CVA with cerebral  aneurysm. -Normalizing BP, continue amlodipine 10mg  qd. Add HCTZ 25mg  qd  R MCA aneurysm MRA showed Left MCA bifurcation 4 mm saccular aneurysm. - per neuro interventional radiology, cerebral angiogram today to further evaluate aneurysm and area of CVA today 1/24   Asymptomatic R high grade carotid stenosis  CTA also showed atheromatous stenoses about the carotid bifurcations bilaterally, 80% on R and 40% on L. R ICA with 80% stenosis this appears unrelated to CVA since infarct on L side. - Per [facility]: follow up as outpatient in 4-6 weeks to discuss elective R carotid intervention  H/o diabetes: Last A1c 4.9 in 2015, repeat a1c 5.5. Not taking home metformin -SSI -Will not restart metformin on DC  Substance use disorder. Alcohol and cocaine. Reports drinking as much alcohol as she can get, last drink was afternoon of admission. Alcohol level 134 in ED. Exam with ataxia. UDS positive for cocaine -CIWA protocol  Hepatitis C/liver cirrhosis: last viral load 149K in 12/2014. Not immune to hep B. No sign of ascites. She was followed Zamor Lynn Ito, MD at [facility]. She was at week 5 of 12 on Harvoni as of 04/20/2016. Per hepatology had cleared viral load on 06/08/16 lab work.  FEN/GI:  -Heart healthy. -Saline lock  Prophylaxis: lovenox  Disposition: pending medical management and CIR placement  Subjective:  Feels well this morning and has no concerns. Is going for cerebral angiogram today.  Objective: Temp:  [97.5 F (36.4 C)-98.7 F (37.1 C)] 98.7 F (37.1 C) (01/24 0417) Pulse Rate:  [67-73] 67 (01/24 0417) Resp:  [14-22] 20 (01/24 0417) BP: (136-167)/(52-72) 136/52 (01/24 0417) SpO2:  [94 %-100 %] 100 % (01/24  0417) Physical Exam: General: lying in bed comfortably, no apparent distress. Cardiovascular: RRR, nl s1 & s2, no murmurs, no edema Respiratory: CTAB, normal effort on room air Abdomen: BS present & normal, soft, NTND, no guarding, no rebound, no  mass Extremities: warm and well perfused Neuro: alert and oriented. R facial droop/decreased creasing. Strength 4/5 in R UE and LE. Sensation intact throughout. Dysarthria  Laboratory:  Recent Labs Lab 06/27/16 2011 06/27/16 2017 06/28/16 0724 06/29/16 0256  WBC 7.3  --  8.0 6.2  HGB 10.9* 11.9* 10.2* 10.0*  HCT 33.1* 35.0* 31.9* 30.9*  PLT 100*  --  100* 95*    Recent Labs Lab 06/27/16 2011 06/27/16 2017 06/28/16 0724 06/29/16 0256 06/30/16 0326  NA 137 142  --  137 138  K 3.4* 3.4*  --  3.3* 4.4  CL 106 107  --  106 109  CO2 18*  --   --  25 23  BUN 11 15  --  14 13  CREATININE 0.78 1.00 0.67 0.75 0.70  CALCIUM 9.5  --   --  9.3 9.4  PROT 7.9  --   --   --   --   BILITOT 0.8  --   --   --   --   ALKPHOS 104  --   --   --   --   ALT 37  --   --   --   --   AST 56*  --   --   --   --   GLUCOSE 114* 117*  --  119* 138*   Mag 1.7 Phos 3.9  Lipid Panel     Component Value Date/Time   CHOL 190 06/28/2016 0449   TRIG 70 06/28/2016 0449   HDL 48 06/28/2016 0449   CHOLHDL 4.0 06/28/2016 0449   VLDL 14 06/28/2016 0449   LDLCALC 128 (H) 06/28/2016 0449   TSH 3.314  Urinalysis    Component Value Date/Time   COLORURINE YELLOW 06/27/2016 2152   APPEARANCEUR HAZY (A) 06/27/2016 2152   LABSPEC 1.035 (H) 06/27/2016 2152   PHURINE 6.0 06/27/2016 2152   GLUCOSEU NEGATIVE 06/27/2016 2152   HGBUR SMALL (A) 06/27/2016 2152   BILIRUBINUR NEGATIVE 06/27/2016 2152   KETONESUR 5 (A) 06/27/2016 2152   PROTEINUR 100 (A) 06/27/2016 2152   UROBILINOGEN >8.0 (H) 02/12/2015 1340   NITRITE POSITIVE (A) 06/27/2016 2152   LEUKOCYTESUR TRACE (A) 06/27/2016 2152     Imaging/Diagnostic Tests: No results found.  Bufford Lope, DO 06/30/2016, 7:01 AM PGY-1, [facility] Intern pager: [phone], text pages welcome

## 2016-06-30 NOTE — Evaluation (Signed)
Speech Language Pathology Evaluation Patient Details Name: Deborah Wise MRN: [id number] DOB: 17-Jul-1950 Today's Date: 06/30/2016 Time: [id number] SLP Time Calculation (min) (ACUTE ONLY): 21 min  Problem List:  Patient Active Problem List   Diagnosis Date Noted  . Aneurysm of middle cerebral artery   . 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 Cocaine abuse   . Stroke (cerebrum) ([place]) 06/27/2016  . Microcytic anemia 12/27/2014  . Thrombocytopenia ([place]) 12/25/2014  . Hypoglycemia 07/21/2013  . Diabetes mellitus ([place]) 07/21/2013  . Essential hypertension 07/21/2013  . Liver disease 07/21/2013  . Alcohol abuse 07/21/2013   Past Medical History:  Past Medical History:  Diagnosis Date  . Arthritis   . Diabetes mellitus without complication ([place])   . Fibroid, uterine   . GERD (gastroesophageal reflux disease)   . Hepatitis    hep C  . Hypertension   . Thrombocytopenia ([place]) 12/25/2014  . Urinary incontinence    Patient noticed mild and is not currently a significant problem   Past Surgical History:  Past Surgical History:  Procedure Laterality Date  . ANKLE SURGERY    . broken ankle    . MULTIPLE EXTRACTIONS WITH ALVEOLOPLASTY  10/25/2011   Procedure: MULTIPLE EXTRACION WITH ALVEOLOPLASTY;  Surgeon: Gae Bon, DDS;  Location: [facility];  Service: Oral Surgery;  Laterality: Bilateral;  Extract teeth numbers one, two, six, seven, eight, nine, ten, eleven, twelve, fifteen, sixteen, eighteen, nineteen, twenty-three, twenty-four, twenty-five, twenty-seven, thirty-two and alveoplasty.   HPI:  [name] a 66 y.o.femalepresenting with right sided weakness. PMH is significant for hypertension, diabetes, hepatitis C, liver cirrhosis, alcohol use disorder andthrombocytopenia. Pt found to have Acute CVA, L pontine infarct, improving Presented with dysarthria, R facial droop, pronator drift and multiple falls 2/2 R hemiparesis over past 3 days.   Assessment / Plan /  Recommendation Clinical Impression  Pt demonstrates primary problem of moderate dysarthria with slow imprecise lingual movement impacting fine motor control wth speech tasks. No finding of focal weakness or asymmetric lingual movement. Increased complexity of word or phrase increases errors. Expressive and receptive language is WNL. Given history of drug and alcohol abuse, mentation appears baseline. Introduced basic compensatory strategies for speech intelligibility. Recommend f/u with SLP at CIR. Will continue acute treatment for functional communication.     SLP Assessment  Patient needs continued Speech Lanaguage Pathology Services    Follow Up Recommendations  Inpatient Rehab    Frequency and Duration min 1 x/week  2 weeks      SLP Evaluation Cognition  Overall Cognitive Status: Within Functional Limits for tasks assessed Arousal/Alertness: Awake/alert Orientation Level: Oriented to person;Oriented to place;Oriented to time;Oriented to situation Attention: Selective Selective Attention: Appears intact Memory: Appears intact Awareness: Appears intact Problem Solving: Appears intact       Comprehension  Auditory Comprehension Overall Auditory Comprehension: Appears within functional limits for tasks assessed Reading Comprehension Reading Status: Within funtional limits    Expression Verbal Expression Overall Verbal Expression: Appears within functional limits for tasks assessed Interfering Components: Speech intelligibility   Oral / Motor  Oral Motor/Sensory Function Overall Oral Motor/Sensory Function: Mild impairment Facial ROM: Reduced right;Suspected CN VII (facial) dysfunction Facial Symmetry: Abnormal symmetry right;Suspected CN VII (facial) dysfunction Facial Strength: Reduced right;Suspected CN VII (facial) dysfunction Lingual ROM: Suspected CN XII (hypoglossal) dysfunction (slow movement) Lingual Symmetry: Within Functional Limits Lingual Strength:  Reduced;Suspected CN XII (hypoglossal) dysfunction Velum: Within Functional Limits Mandible: Within Functional Limits Motor Speech Overall Motor Speech: Impaired Respiration: Within functional limits Phonation: Normal Resonance:  Within functional limits Articulation: Impaired Level of Impairment: Word Intelligibility: Intelligibility reduced Word: 25-49% accurate Phrase: 0-24% accurate Sentence: 0-24% accurate Conversation: 0-24% accurate Motor Speech Errors: Aware   GO                    Deborah Wise, Deborah Wise 06/30/2016, 1:40 PM

## 2016-06-30 NOTE — Progress Notes (Signed)
Patient transported back to floor with 2 Rns. Bedside report given and left groin checked, level 0 WDL.

## 2016-06-30 NOTE — Sedation Documentation (Signed)
IR tech placing exoseal to the left groin; will hold pressure.

## 2016-06-30 NOTE — Procedures (Signed)
S/P 4 Vessel cerebral arteriogram. RT CFA approach. Findings. 1.Approx 31mm x 3.63mm Lt MCA inf division aneurysm. 2.Approc 2.89mm x 20mm RT PCOM anerysm.  3.Approx 60% stenosis of RT ICA prox , with severe stenosis of RT ECA prox 4.Approx 750 % stenosis of LT MCA m1 seg prox to the trifurcation

## 2016-06-30 NOTE — Progress Notes (Signed)
OT Cancellation Note  Patient Details Name: Deborah Wise MRN: [id number] DOB: 1950-11-14   Cancelled Treatment:    Reason Eval/Treat Not Completed: Patient at procedure or test/ unavailable  Malka So 06/30/2016, 1:47 PM

## 2016-06-30 NOTE — Progress Notes (Signed)
Transitions of Care Pharmacy Note  Plan:  No acute needs identified via chart review  --------------------------------------------- Deborah Wise is an 66 y.o. female who presents with a chief complaint of stroke. In anticipation of discharge, pharmacy has reviewed this patient's prior to admission medication history, as well as current inpatient medications listed per the [facility]. Due to patient condition and plan for CIR, medications were not discussed at bedside.  Assessment: Potential of compliance: good Barriers to Obtaining Medications: none identified   Time spent in chart review: 15 min    Thank you for allowing pharmacy to be a part of this patient's care.  Argie Ramming, PharmD Pharmacy Resident  Pager [phone] 06/30/16 5:58 PM

## 2016-06-30 NOTE — Progress Notes (Signed)
STROKE TEAM PROGRESS NOTE   HISTORY OF PRESENT ILLNESS (per record) Deborah Wise is a 66 y.o. female the history of diabetes, hypertension who presents with unsteadiness which started 2 days ago, but worsened this evening. Earlier today, she was able to walk but was falling frequently, then after she got home around 5 PM, she was unable to walk and therefore she was brought to the ER. MS Contin could stroke on route, but her last level was actually 2 days ago and therefore she is not a candidate for IV TPA.  Given her worsening, on CT head was performed which does not demonstrate any large vessel occlusion.   LKW: 2 days ago tpa given?: no, out of window   SUBJECTIVE (INTERVAL HISTORY)  Patient is neurologically stable and awaiting diagnostic cerebral catheter angiogram. She states her right-sided weakness is improving. She has no new complaints OBJECTIVE Temp:  [98.4 F (36.9 C)-98.7 F (37.1 C)] 98.5 F (36.9 C) (01/24 1225) Pulse Rate:  [64-75] 75 (01/24 1350) Cardiac Rhythm: Normal sinus rhythm (01/24 1350) Resp:  [14-22] 18 (01/24 1350) BP: (136-186)/(52-74) 169/74 (01/24 1350) SpO2:  [94 %-100 %] 99 % (01/24 1350)  CBC:  Recent Labs Lab 06/27/16 2011  06/28/16 0724 06/29/16 0256  WBC 7.3  --  8.0 6.2  NEUTROABS 4.2  --   --   --   HGB 10.9*  < > 10.2* 10.0*  HCT 33.1*  < > 31.9* 30.9*  MCV 76.3*  --  75.8* 76.1*  PLT 100*  --  100* 95*  < > = values in this interval not displayed.  Basic Metabolic Panel:  Recent Labs Lab 06/28/16 0449  06/29/16 0256 06/30/16 0326  NA  --   --  137 138  K  --   --  3.3* 4.4  CL  --   --  106 109  CO2  --   --  25 23  GLUCOSE  --   --  119* 138*  BUN  --   --  14 13  CREATININE  --   < > 0.75 0.70  CALCIUM  --   --  9.3 9.4  MG 1.7  --   --   --   PHOS 3.9  --   --   --   < > = values in this interval not displayed.  Lipid Panel:     Component Value Date/Time   CHOL 190 06/28/2016 0449   TRIG 70 06/28/2016 0449    HDL 48 06/28/2016 0449   CHOLHDL 4.0 06/28/2016 0449   VLDL 14 06/28/2016 0449   LDLCALC 128 (H) 06/28/2016 0449   HgbA1c:  Lab Results  Component Value Date   HGBA1C 5.4 06/28/2016   Urine Drug Screen:     Component Value Date/Time   LABOPIA NONE DETECTED 06/28/2016 0443   COCAINSCRNUR POSITIVE (A) 06/28/2016 0443   LABBENZ NONE DETECTED 06/28/2016 0443   AMPHETMU NONE DETECTED 06/28/2016 0443   THCU NONE DETECTED 06/28/2016 0443   LABBARB NONE DETECTED 06/28/2016 0443      IMAGING  Ct Angio Head and Neck W Or Wo Contrast 06/27/2016 1. Negative CTA for emergent large vessel occlusion. Evolving left pontine infarct stable from previous CT.  2. 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  3. Moderate atheromatous disease involving the anterior and posterior circulations as above, most notable within the distal ACAs and right PCA. 4. 4  x 3 x 4 mm left MCA bifurcation aneurysm.     Mr Jodene Nam Head Wo Contrast 06/28/2016 1. Left paramedian pontine acute infarction is stable in distribution comparing with prior CT given differences in technique. No hemorrhage.  2. Background of mild chronic microvascular ischemic changes and parenchymal volume loss of the brain.  3. No large vessel occlusion or high-grade stenosis of the circle of Willis.  4. Intracranial atherosclerosis with multiple areas of mild stenosis most pronounced in proximal PCA and left proximal A1 segments.  5. 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 Focal hypodensity within the left paramedian pons, suspicious for evolving acute ischemic infarct.  2. ASPECTS is 10  3. Age-related cerebral atrophy with mild chronic small vessel ischemic disease.     PHYSICAL EXAM Obese middle aged african american lady not in distress. . Afebrile. Head is nontraumatic. Neck is supple without bruit.     Cardiac exam no murmur or gallop. Lungs are clear to auscultation. Distal pulses are well felt. Neurological Exam :   Awake alert oriented x 3 normal speech and language. Mild right lower face asymmetry. Tongue midline. No drift. Mild diminished fine finger movements on right. Orbits left over right upper extremity. Mild right grip weak.. Normal sensation . Normal coordination.Gait deferred       ASSESSMENT/PLAN [name] is a 66 y.o. female with history of hypertension, hepatitis, thrombocytopenia, diabetes mellitus, and substance abuse presenting with unsteadiness and left hemiparesthesias. She did not receive IV t-PA due to late presentation.  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 Left 1-39% ICA stenosis.  2D Echo - EF 60-65%.  LDL - 128  HgbA1c - 5.4  VTE prophylaxis - Lovenox Diet NPO time specified Except for: Sips with Meds  No antithrombotic prior to admission, now on aspirin 325 mg daily  Patient counseled to be compliant with her antithrombotic medications  Ongoing aggressive stroke risk factor management  Therapy recommendations:  CIR recommended  Disposition:  Pending  Hypertension  Blood pressure tends to run high  In consideration of the patient's cerebral aneurysm would try to normalize blood pressure at this point.  Long-term BP goal normotensive  Hyperlipidemia  Home meds:  No lipid lowering medications prior to admission  LDL 128, goal < 70  Now on atorvastatin 80 mg daily  Continue statin at discharge  Diabetes  HgbA1c 5.4, goal < 7.0  Controlled  Other Stroke Risk Factors  Advanced age  ETOH use, advised to abstain from alcohol.  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 Leonie Man - discontinue Plavix in the setting of a cerebral aneurysm.  Recommend cerebral angiogram and consult Dr. Estanislado Pandy for aneurysm.  Start to normalize blood pressure.  Hospital day # 3  Mikey Bussing PA-C [facility] Pager [phone] 06/30/2016, 1:51 PM I have personally examined this patient, reviewed notes, independently viewed imaging studies, participated in medical decision making and plan  of care.ROS completed by me personally and pertinent positives fully documented  I have made any additions or clarifications directly to the above note. Agree with note above. Complex situation. Patient presented with left paramedian pontine infarct from small vessel disease and has mild right hemiparesis but also has asymptomatic extracranial carotid stenosis as well as 4 mm left MCA bifurcation aneurysm. Recommend check diagnostic cerebral catheter angiogram to further a evaluate carotid stenosis as well as left MCA aneurysm and then plan definitive treatment. Agree with plan for outpatient vascular surgery for carotid stenosis and she may need outpatient follow-up with neuroradiology for MCA aneurysm treatment discussion. Patient counseled to quit cocaine and smoking and recommend  aspirin for stroke prevention and aggressive risk factor modification. Greater than 50% time during this 25 minute visit was spent on counseling and coordination of care.stroke risk, evaluation, and pain aneurysm and carotid stenosis treatment option discussion  Antony Contras, MD Medical Director [facility] Pager: [phone] 06/30/2016 1:51 PM   To contact Stroke Continuity provider, please refer to [email or web address] After hours, contact  General Neurology

## 2016-06-30 NOTE — Sedation Documentation (Signed)
Patient denies pain and is resting comfortably.  

## 2016-07-01 ENCOUNTER — Telehealth: Payer: Self-pay | Admitting: Surgery

## 2016-07-01 LAB — CBC
HCT: 32.1 % — ABNORMAL LOW (ref 36.0–46.0)
Hemoglobin: 10.4 g/dL — ABNORMAL LOW (ref 12.0–15.0)
MCH: 24.8 pg — ABNORMAL LOW (ref 26.0–34.0)
MCHC: 32.4 g/dL (ref 30.0–36.0)
MCV: 76.4 fL — ABNORMAL LOW (ref 78.0–100.0)
Platelets: 113 10*3/uL — ABNORMAL LOW (ref 150–400)
RBC: 4.2 MIL/uL (ref 3.87–5.11)
RDW: 16.3 % — ABNORMAL HIGH (ref 11.5–15.5)
WBC: 6.7 10*3/uL (ref 4.0–10.5)

## 2016-07-01 LAB — BASIC METABOLIC PANEL
Anion gap: 8 (ref 5–15)
BUN: 12 mg/dL (ref 6–20)
CO2: 23 mmol/L (ref 22–32)
Calcium: 9.5 mg/dL (ref 8.9–10.3)
Chloride: 105 mmol/L (ref 101–111)
Creatinine, Ser: 0.65 mg/dL (ref 0.44–1.00)
GFR calc Af Amer: >60 mL/min (ref >60)
GFR calc non Af Amer: >60 mL/min (ref >60)
Glucose, Bld: 140 mg/dL — ABNORMAL HIGH (ref 65–99)
Potassium: 3.9 mmol/L (ref 3.5–5.1)
Sodium: 136 mmol/L (ref 135–145)

## 2016-07-01 LAB — URINE CULTURE: Culture: >=100000 — AB

## 2016-07-01 NOTE — Progress Notes (Signed)
Occupational Therapy Treatment Patient Details Name: Deborah Wise MRN: [id number] DOB: [birth year] Today's Date: 07/01/2016    History of present illness 66 y.o. female presenting with right sided weakness. PMH is significant for hypertension, diabetes, hepatitis C, liver cirrhosis, alcohol use disorder and thrombocytopenia. Imaging revealed Acute CVA, L pontine infarct.   OT comments  Pt continues to demonstrate impulsivity and decreased safety awareness. R UE functional use limited by shoulder weakness. Tolerating activity well this visit and wants to go home. Pt's husband in room during session. Continues to be a good rehab candidate.  Follow Up Recommendations  CIR    Equipment Recommendations   (defer to next venue)    Recommendations for Other Services      Precautions / Restrictions Precautions Precautions: Fall Restrictions Weight Bearing Restrictions: No       Mobility Bed Mobility Overal bed mobility: Needs Assistance Bed Mobility: Supine to Sit     Supine to sit: Min assist     General bed mobility comments: increased time, assist for clearing blankets  Transfers Overall transfer level: Needs assistance Equipment used: None Transfers: Sit to/from Stand Sit to Stand: Min guard              Balance Overall balance assessment: Needs assistance;History of Falls Sitting-balance support: Feet supported Sitting balance-Leahy Scale: Good       Standing balance-Leahy Scale: Fair                     ADL Overall ADL's : Needs assistance/impaired     Grooming: Min guard;Wash/dry hands;Standing           Upper Body Dressing : Minimal assistance;Sitting   Lower Body Dressing: Set up;Sitting/lateral leans Lower Body Dressing Details (indicate cue type and reason): for socks Toilet Transfer: Minimal assistance;Ambulation   Toileting- Clothing Manipulation and Hygiene: Min guard;Sit to/from stand       Functional mobility during ADLs:  Minimal assistance        Vision                     Perception     Praxis      Cognition   Behavior During Therapy: Impulsive Overall Cognitive Status: Impaired/Different from baseline Area of Impairment: Safety/judgement          Safety/Judgement: Decreased awareness of safety;Decreased awareness of deficits          Extremity/Trunk Assessment               Exercises   R shoulder A/AROM x 10, FF, abduction, ER   Shoulder Instructions       General Comments      Pertinent Vitals/ Pain       Pain Assessment: No/denies pain  Home Living                                   Additional Comments: reports staying at her parent's home alot of the time      Prior Functioning/Environment              Frequency  Min 3X/week        Progress Toward Goals  OT Goals(current goals can now be found in the care plan section)  Progress towards OT goals: Progressing toward goals  Acute Rehab OT Goals Patient Stated Goal: to get back to normal Time For Goal Achievement: 07/13/16 Potential to Achieve Goals: Good  Plan Discharge plan remains appropriate    Co-evaluation                 End of Session Equipment Utilized During Treatment: Gait belt   Activity Tolerance Patient tolerated treatment well   Patient Left in chair;with call bell/phone within reach;with chair alarm set;with family/visitor present   Nurse Communication          Time: [id number] OT Time Calculation (min): 18 min  Charges: OT General Charges $OT Visit: 1 Procedure OT Treatments $Self Care/Home Management : 8-22 mins  Malka So 07/01/2016, 11:49 AM  [phone]

## 2016-07-01 NOTE — PMR Pre-admission (Signed)
PMR Admission Coordinator Pre-Admission Assessment  Patient: Deborah Wise is an 66 y.o., female MRN: [id number] DOB: Jul 06, 1950 Height: 5\' 5"  (165.1 cm) Weight: 74 kg (163 lb 2.3 oz)             Insurance Information HMO:     PPO:      PCP:      IPA:      80/20:      OTHER:  PRIMARY: Medicaid [place] Access      Policy#: [id number]      Subscriber: Self CM Name:       Phone#:      Fax#:  Pre-Cert#: Coverage Code: [id number]      Employer: Not employed  Benefits:  Phone #: [phone]     Name: automated system   Eff. 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 Per chart review patient lives alone independently with occasionalprior to admission. One level home. She is acaregiver to both of her parents who are wheelchair-boundwho also have hired caregivers.She has a boyfriend that can assist.Presented 06/27/2016 with right-sided weakness, slurred speechand fall. Urine drug screen positive for cocaine. Cranial CT scan showed focal hypodensity within the left paramedian pons, suspicious for evolving acute ischemic infarct. Patient did not receive TPA. CT angiogram head and neck negative for emergent large vessel occlusion. 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 MRI 06/28/2016 with left paramedian pontine acute infarct stable in comparison to prior CT. No hemorrhage. Echocardiogram with ejection fraction [id number] grade 1 diastolic dysfunction. No regional wall motion abnormalities. Carotid Dopplers with right 60-79% ICA stenosis.. Diagnostic angiogram 06/30/2016 per interventional radiology again identified right carotid stenosis measuring 60%. Plan is to follow-up outpatient vascular surgery Dr. Trula Slade in 6 weeks with repeat carotid duplex. Aspirin for CVA prophylaxis. Subcutaneous Lovenox for DVT prophylaxis. Tolerating a regular diet. Physical and occupational therapy evaluations completed with recommendations of physical medicine rehabilitation consult.Patient was admitted for comprehensive rehabilitation program   NIH Total: 5   Past Medical History  Past Medical History:  Diagnosis Date  . Arthritis   . Diabetes mellitus without complication ([place])   . Fibroid, uterine   . GERD (gastroesophageal reflux disease)   . Hepatitis    hep C  . Hypertension   . Thrombocytopenia ([place]) 12/25/2014  . Urinary incontinence    Patient noticed mild and is not currently a significant problem    Family History  family history includes Cancer in her father; Diabetes type II in her  mother; Hypertension in her mother.  Prior Rehab/Hospitalizations:  Has the patient had major surgery during 100 days prior to admission? No  Current Medications   Current Facility-Administered Medications:  .   stroke: mapping our early stages of recovery book, , Does not apply, Once, Mercy Riding, MD .  acetaminophen (TYLENOL) tablet 650 mg, 650 mg, Oral, Q4H PRN **OR** acetaminophen (TYLENOL) solution 650 mg, 650 mg, Per Tube, Q4H PRN **OR** acetaminophen (TYLENOL) suppository 650 mg, 650 mg, Rectal, Q4H PRN, Mercy Riding, MD .  amLODipine (NORVASC) tablet 10 mg, 10 mg, Oral, Daily, Elberta Leatherwood, MD, 10 mg at  07/01/16 0901 .  aspirin EC tablet 325 mg, 325 mg, Oral, Daily, Greta Doom, MD, 325 mg at 07/01/16 0900 .  atorvastatin (LIPITOR) tablet 80 mg, 80 mg, Oral, q1800, Bufford Lope, DO, 80 mg at 06/30/16 1729 .  enoxaparin (LOVENOX) injection 40 mg, 40 mg, Subcutaneous, Daily, Mercy Riding, MD, 40 mg at 07/01/16 0859 .  folic acid (FOLVITE) tablet 1 mg, 1 mg, Oral, Daily, Zenia Resides, MD, 1 mg at 07/01/16 0859 .  hydrochlorothiazide (HYDRODIURIL) tablet 25 mg, 25 mg, Oral, Daily, Elsia J Yoo, DO, 25 mg at 07/01/16 0900 .  LORazepam (ATIVAN) injection 2-3 mg, 2-3 mg, Intravenous, Q1H PRN, Mercy Riding, MD .  senna-docusate (Senokot-S) tablet 1 tablet, 1 tablet, Oral, QHS PRN, Mercy Riding, MD .  thiamine (VITAMIN B-1) tablet 100 mg, 100 mg, Oral, Daily, Zenia Resides, MD, 100 mg at 07/01/16 [id number]  Patients Current Diet: Diet Heart Room service appropriate? Yes; Fluid consistency: Thin  Precautions / Restrictions Precautions Precautions: Fall Restrictions Weight Bearing Restrictions: No   Has the patient had 2 or more falls or a fall with injury in the past year?No  Prior Activity Level Community (5-7x/wk): Prior to admission patient was fully independent and assisting her parents who are disabled.    Home Assistive Devices / Equipment Home Assistive Devices/Equipment: None Home Equipment: Cane - single point  Prior Device Use: Indicate devices/aids used by the patient prior to current illness, exacerbation or injury? None of the above  Prior Functional Level Prior Function Level of Independence: Independent Comments: 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   Self Care: Did the patient need help bathing, dressing, using the toilet or eating? Independent  Indoor Mobility: Did the patient need assistance with walking from room to room (with or without device)? Independent  Stairs: Did the patient need assistance with internal or external stairs (with or  without device)? Independent  Functional Cognition: Did the patient need help planning regular tasks such as shopping or remembering to take medications? Independent  Current Functional Level Cognition  Arousal/Alertness: Awake/alert Overall Cognitive Status: Within Functional Limits for tasks assessed Orientation Level: Oriented X4 Safety/Judgement: Decreased awareness of safety, Decreased awareness of deficits General Comments: pt attempting to walk with lines attached to monitor Attention: Selective Selective Attention: Appears intact Memory: Appears intact Awareness: Appears intact Problem Solving: Appears intact    Extremity Assessment (includes Sensation/Coordination)  Upper Extremity Assessment: RUE deficits/detail RUE Deficits / Details: 3-/5 shoulder, 3+/5 elbow to hand RUE Coordination: decreased gross motor, decreased fine motor  Lower Extremity Assessment: Defer to PT evaluation RLE Deficits / Details: 4/5 gross motions  RLE Coordination: decreased fine motor, decreased gross motor    ADLs  Overall ADL's : Needs assistance/impaired Eating/Feeding: Modified independent, Sitting Grooming: Brushing hair, Oral care, Standing, Min guard Grooming Details (indicate  cue type and reason): pt able to open containers and apply toothpaste to toothbrush Upper Body Bathing: Set up, Sitting Lower Body Bathing: Minimal assistance, Sit to/from stand Lower Body Bathing Details (indicate cue type and reason): assist for balance Upper Body Dressing : Minimal assistance, Sitting Lower Body Dressing: Minimal assistance, Sit to/from stand Lower Body Dressing Details (indicate cue type and reason): able to don and doff socks, min assist for balance with standing Toilet Transfer: Minimal assistance, Ambulation Toileting- Clothing Manipulation and Hygiene: Minimal assistance, Sit to/from stand Functional mobility during ADLs: Minimal assistance    Mobility  General bed mobility comments:  received OOB    Transfers  Overall transfer level: Needs assistance Equipment used: None Transfers: Sit to/from Stand Sit to Stand: Min guard General transfer comment: min guard assist for stability in elevation to standing    Ambulation / Gait / Stairs / Wheelchair Mobility  Ambulation/Gait Ambulation/Gait assistance: Museum/gallery curator (Feet): 90 Feet Assistive device: 1 person hand held assist Gait Pattern/deviations: Step-through pattern, Decreased stride length, Decreased stance time - right, Narrow base of support General Gait Details: patient with some instability during ambulation, ocassional right leg lag noted with increased instability. Multiple balance checks with min assist for stability Gait velocity: decreased Gait velocity interpretation: Below normal speed for age/gender    Posture / Balance Static Standing Balance Single Leg Stance - Right Leg:  (can not perform without UE support) Single Leg Stance - Left Leg: 10 Balance Overall balance assessment: Needs assistance, History of Falls Sitting-balance support: Feet supported Sitting balance-Leahy Scale: Good Standing balance support: During functional activity Single Leg Stance - Right Leg:  (can not perform without UE support) Single Leg Stance - Left Leg: 10    Special needs/care consideration BiPAP/CPAP: No CPM: No Continuous Drip IV: No Dialysis: No        Life Vest: No Oxygen: No Special Bed: No Trach Size: No Wound Vac (area): No       Skin: WDL                               Bowel mgmt: 1/23 Continent  Bladder mgmt: Continent  Diabetic mgmt: HgbA1c - 5.4; PTA patient managed with oral medication   Previous Home Environment Living Arrangements: Alone Available Help at Discharge: Family, Available PRN/intermittently Type of Home: Apartment Home Layout: One level Home Access: Stairs to enter Entrance Stairs-Rails: Can reach both Entrance Stairs-Number of Steps: flight Bathroom  Shower/Tub: Chiropodist: Standard Home Care Services: No Additional Comments: reports staying at her parent's home a lot of the time  Discharge Living Setting Plans for Discharge Living Setting: Other (Comment) (plans to stay with her boyfriend upon discharge ) Type of Home at Discharge: House Discharge Home Layout: One level Discharge Home Access: Stairs to enter Entrance Stairs-Rails: None Entrance Stairs-Number of Steps: 2 Discharge Bathroom Shower/Tub: Tub/shower unit, Curtain Discharge Bathroom Toilet: Standard Discharge Bathroom Accessibility: Yes How Accessible: Accessible via walker Does the patient have any problems obtaining your medications?: Yes (Describe) (cost)  Social/Family/Support Systems Patient Roles: Caregiver, Partner Contact Information: Boyfriend: Gregary Signs  Anticipated Caregiver: Boyfriend  Anticipated Caregiver's Contact Information: [phone] Ability/Limitations of Caregiver: None  Caregiver Availability: 24/7 Discharge Plan Discussed with Primary Caregiver: No (discussed with patient given Mod I goals ) Is Caregiver In Agreement with Plan?:  (patient in agreement with plan ) Does Caregiver/Family have Issues with Lodging/Transportation while Pt is in Rehab?:  No  Goals/Additional Needs Patient/Family Goal for Rehab: PT, OT, SLP Mod I  Expected length of stay: about 7 days  Cultural Considerations: None Dietary Needs: Heart Healty Diet Restrictions  Equipment Needs: TBD Special Service Needs: None Additional Information: Patient reports the she also has 3 children  Pt/Family Agrees to Admission and willing to participate: Yes Program Orientation Provided & Reviewed with Pt/Caregiver Including Roles  & Responsibilities: Yes Additional Information Needs: None Information Needs to be Provided By: N/A  Decrease burden of Care through IP rehab admission: No  Possible need for SNF placement upon discharge: No  Patient  Condition: This patient's medical and functional status has changed since the consult dated: 06/29/16 in which the Rehabilitation Physician determined and documented that the patient's condition is appropriate for intensive rehabilitative care in an inpatient rehabilitation facility. See "History of Present Illness" (above) for medical update. Functional changes are: overall min assist. Patient's medical and functional status update has been discussed with the Rehabilitation physician and patient remains appropriate for inpatient rehabilitation. Will admit to inpatient rehab today.  Preadmission Screen Completed By:  Gunnar Fusi, 07/01/2016 10:13 AM ______________________________________________________________________   Discussed status with Dr.  Naaman Plummer on 07/02/2016 at  1053 and received telephone approval for admission today.  Admission Coordinator:  Danne Baxter, time [id number] Date 07/02/2016

## 2016-07-01 NOTE — Progress Notes (Addendum)
Inpatient Rehabilitation  Unfortunately, I do not have a bed available to offer patient today.  I have notified patient and team.  If patient remains in house will follow up tomorrow.  However, given high level abilities, returning home with 24/7 supervision and home health follow up may also be appropriate.  Please plan for my co-worker Danne Baxter to follow up tomorrow in my absence; she can be reached at [phone].     Carmelia Roller., CCC/SLP Admission Coordinator  [place]  Cell [phone]

## 2016-07-01 NOTE — Progress Notes (Signed)
Report called pt to go to room 5C13 via w/c with belongings.

## 2016-07-01 NOTE — Progress Notes (Signed)
Nutrition Brief Note  Patient identified on the Malnutrition Screening Tool (MST) Report.  No recent weight loss.  Wt Readings from Last 15 Encounters:  06/27/16 163 lb 2.3 oz (74 kg)  02/12/15 157 lb (71.2 kg)  01/10/15 154 lb 11.2 oz (70.2 kg)  12/25/14 159 lb 14.4 oz (72.5 kg)  07/21/13 166 lb 0.1 oz (75.3 kg)  12/12/11 192 lb (87.1 kg)  10/19/11 195 lb 1.7 oz (88.5 kg)    Body mass index is 27.15 kg/m. Patient meets criteria for overweight based on current BMI.   Current diet order is heart healthy, patient is consuming approximately 100% of meals at this time. Labs and medications reviewed.   No nutrition interventions warranted at this time. If nutrition issues arise, please consult RD.   Molli Barrows, RD, LDN, [place] Pager [phone] After Hours Pager [phone]

## 2016-07-01 NOTE — Progress Notes (Signed)
Angiogram reviewed.  Right carotid stenosis measured 60%.  Since this is asymptomatic, I would recommend elective carotid endarterectomy once the stenosis becomes greater than 80%.   I will see her back in the office in 6 months with a repeat carotid duplex.   Deborah Wise

## 2016-07-01 NOTE — Progress Notes (Signed)
Family Medicine Teaching Service Daily Progress Note Intern Pager: [phone]  Patient name: Deborah Wise Medical record number: [id number] Date of birth: [birth year] Age: 66 y.o. Gender: female  Primary Care Provider: Pcp Not In System Consultants: Neurology Code Status: Full  Pt Overview and Major Events to Date:  1/21 Admitted for stroke  Assessment and Plan: Kately Acerra is a 66 y.o. female presenting with right sided weakness. PMH is significant for hypertension, diabetes, hepatitis C, liver cirrhosis, alcohol use disorder and thrombocytopenia  Acute CVA, L pontine infarct, improving Presented with aphasia, R facial droop, pronator drift and multiple falls 2/2 R hemiparesis over past 3 days, was not a candidate for tPA given presented outside window. CT and CTA showed left paramedian pons acute infarct. MRI/MRA showed L paramedian pontine acute infarction with intracranial atherosclerosis with multiple areas of mild stenosis most pronounced in proximal PCA and left proximal A1 segments.  -Neurology stroke team following             -DC plavix due to cerebral aneurysm. Continue aspirin 325 mg daily             -Echo EF 60-65%, G1DD  -carotid dopplers prelim: Right 60-79% ICA stenosis. Left 1-39% ICA stenosis. -Risk stratification labs: LDL 128, TSH 3.314, A1c 5.5 -Neurovascular check q4hrs -Cardiac monitor -Normalizing BP in setting of cerebral aneurysm -Atorvastatin 80mg  qd -Fall precautions  -PT/OT recommended CIR, and per PM&R is appropriate, now awaiting CIR bed  Hypertension: BP 126/77 this am Not compliant with home amlodipine. Has been on HCTZ in the past. Per stroke team, start normalizing BP in setting of acute CVA with cerebral aneurysm. -Normalizing BP, continue amlodipine 10mg  qd and HCTZ 25mg  qd  L MCA aneurysm MRA showed Left MCA bifurcation 4 mm saccular aneurysm. Cerebral angiogram performed by neuroradiology showing L MCA inf division aneurysm,  R PCOM aneurysm,  R ICA 60% stenosis with severe stenosis of R ECA, L MCA stenosis  - Neuro interventional radiology following, appreciate recommendations  Asymptomatic R carotid stenosis  CTA also showed atheromatous stenoses about the carotid bifurcations bilaterally, 80% on R and 40% on L. R ICA with 80% stenosis on carotid dopplers however on cerebral angiogram sis approximately 60% this appears unrelated to CVA since infarct on L side. - Per [facility]: follow up as outpatient in 6 months with repeat carotid duplex to discuss elective R carotid intervention   H/o diabetes: Last A1c 4.9 in 2015, repeat a1c 5.5. Not taking home metformin -SSI -Will not restart metformin on DC  Substance use disorder. Alcohol and cocaine. Reports drinking as much alcohol as she can get, last drink was afternoon of admission. Alcohol level 134 in ED. Exam with ataxia. UDS positive for cocaine -CIWA protocol  Hepatitis C/liver cirrhosis: last viral load 149K in 12/2014. Not immune to hep B. No sign of ascites. She was followed [facility] Lynn Ito[facility] at [facility]. She was at week 5 of 12 on Harvoni as of 04/20/2016. Per hepatology had cleared viral load on 06/08/16 lab work.  FEN/GI:  -Heart healthy. -Saline lock  Prophylaxis: lovenox  Disposition: pending medical management and CIR placement  Subjective:  Feels well this morning and has no concerns.   Objective: Temp:  [98.3 F (36.8 C)-99.3 F (37.4 C)] 98.5 F (36.9 C) (01/25 0318) Pulse Rate:  [66-94] 94 (01/25 0318) Resp:  [12-25] 25 (01/25 0318) BP: (123-186)/(55-80) 126/77 (01/25 0318) SpO2:  [96 %-100 %] 96 % (01/25 0318) Physical Exam: General: lying in  bed comfortably, no apparent distress. Cardiovascular: RRR, nl s1 & s2, no murmurs, no edema Respiratory: CTAB, normal effort on room air Abdomen: BS present & normal, soft, NTND, no guarding, no rebound, no mass Extremities: warm and well perfused Neuro: alert and oriented. R  facial droop/decreased creasing. Strength 4/5 in R UE and LE. Sensation intact throughout. Dysarthria  Laboratory:  Recent Labs Lab 06/28/16 0724 06/29/16 0256 07/01/16 0311  WBC 8.0 6.2 6.7  HGB 10.2* 10.0* 10.4*  HCT 31.9* 30.9* 32.1*  PLT 100* 95* 113*    Recent Labs Lab 06/27/16 2011  06/29/16 0256 06/30/16 0326 07/01/16 0311  NA 137  < > 137 138 136  K 3.4*  < > 3.3* 4.4 3.9  CL 106  < > 106 109 105  CO2 18*  --  25 23 23   BUN 11  < > 14 13 12   CREATININE 0.78  < > 0.75 0.70 0.65  CALCIUM 9.5  --  9.3 9.4 9.5  PROT 7.9  --   --   --   --   BILITOT 0.8  --   --   --   --   ALKPHOS 104  --   --   --   --   ALT 37  --   --   --   --   AST 56*  --   --   --   --   GLUCOSE 114*  < > 119* 138* 140*  < > = values in this interval not displayed.  Lipid Panel     Component Value Date/Time   CHOL 190 06/28/2016 0449   TRIG 70 06/28/2016 0449   HDL 48 06/28/2016 0449   CHOLHDL 4.0 06/28/2016 0449   VLDL 14 06/28/2016 0449   LDLCALC 128 (H) 06/28/2016 0449   TSH 3.314   Imaging/Diagnostic Tests: S/P 4 Vessel cerebral arteriogram. RT CFA approach. Findings. 1.Approx 54mm x 3.47mm Lt MCA inf division aneurysm. 2.Approc 2.80mm x 51mm RT PCOM anerysm.  3.Approx 60% stenosis of RT ICA prox , with severe stenosis of RT ECA prox 4.Approx 75 % stenosis of LT MCA m1 seg prox to the trifurcation   Bufford Lope, DO 07/01/2016, 7:08 AM PGY-1, [place] Intern pager: [phone], text pages welcome

## 2016-07-01 NOTE — Progress Notes (Signed)
PT Cancellation Note  Patient Details Name: Deborah Wise MRN: [id number] DOB: 1950/09/15   Cancelled Treatment:    Reason Eval/Treat Not Completed: Other (comment). Pt off of the floor, transferring to new unit at this time. PT will continue to f/u with pt at a later time if available.   [name] 07/01/2016, 2:35 PM

## 2016-07-01 NOTE — Progress Notes (Signed)
STROKE TEAM PROGRESS NOTE   HISTORY OF PRESENT ILLNESS (per record) Deborah Wise is a 66 y.o. female the history of diabetes, hypertension who presents with unsteadiness which started 2 days ago, but worsened this evening. Earlier today, she was able to walk but was falling frequently, then after she got home around 5 PM, she was unable to walk and therefore she was brought to the ER. MS Contin could stroke on route, but her last level was actually 2 days ago and therefore she is not a candidate for IV TPA.  Given her worsening, on CT head was performed which does not demonstrate any large vessel occlusion.   LKW: 2 days ago tpa given?: no, out of window   SUBJECTIVE (INTERVAL HISTORY)  Patient is neurologically stable and had  diagnostic cerebral catheter angiogram. Which showed 4 mm left MCA inf div saccular aneurysm and 3 mm right PCom aneurysm. RICA stenosis is only 60 % She states her right-sided weakness is improving. She has no new complaints OBJECTIVE Temp:  [98.3 F (36.8 C)-99.3 F (37.4 C)] 98.7 F (37.1 C) (01/25 1100) Pulse Rate:  [67-94] 79 (01/25 1100) Cardiac Rhythm: Normal sinus rhythm (01/25 1100) Resp:  [12-25] 23 (01/25 1100) BP: (123-186)/(55-80) 132/64 (01/25 1100) SpO2:  [96 %-100 %] 99 % (01/25 1100)  CBC:  Recent Labs Lab 06/27/16 2011  06/29/16 0256 07/01/16 0311  WBC 7.3  < > 6.2 6.7  NEUTROABS 4.2  --   --   --   HGB 10.9*  < > 10.0* 10.4*  HCT 33.1*  < > 30.9* 32.1*  MCV 76.3*  < > 76.1* 76.4*  PLT 100*  < > 95* 113*  < > = values in this interval not displayed.  Basic Metabolic Panel:  Recent Labs Lab 06/28/16 0449  06/30/16 0326 07/01/16 0311  NA  --   < > 138 136  K  --   < > 4.4 3.9  CL  --   < > 109 105  CO2  --   < > 23 23  GLUCOSE  --   < > 138* 140*  BUN  --   < > 13 12  CREATININE  --   < > 0.70 0.65  CALCIUM  --   < > 9.4 9.5  MG 1.7  --   --   --   PHOS 3.9  --   --   --   < > = values in this interval not  displayed.  Lipid Panel:     Component Value Date/Time   CHOL 190 06/28/2016 0449   TRIG 70 06/28/2016 0449   HDL 48 06/28/2016 0449   CHOLHDL 4.0 06/28/2016 0449   VLDL 14 06/28/2016 0449   LDLCALC 128 (H) 06/28/2016 0449   HgbA1c:  Lab Results  Component Value Date   HGBA1C 5.4 06/28/2016   Urine Drug Screen:     Component Value Date/Time   LABOPIA NONE DETECTED 06/28/2016 0443   COCAINSCRNUR POSITIVE (A) 06/28/2016 0443   LABBENZ NONE DETECTED 06/28/2016 0443   AMPHETMU NONE DETECTED 06/28/2016 0443   THCU NONE DETECTED 06/28/2016 0443   LABBARB NONE DETECTED 06/28/2016 0443      IMAGING  Ct Angio Head and Neck W Or Wo Contrast 06/27/2016 1. Negative CTA for emergent large vessel occlusion. Evolving left pontine infarct stable from previous CT.  2. 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  3. Moderate atheromatous disease involving the anterior and posterior circulations as above, most notable within the distal ACAs and right PCA. 4. 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     Mr Deborah Wise Head Wo Contrast 06/28/2016 1. Left paramedian pontine acute infarction is stable in distribution comparing with prior CT given differences in technique. No hemorrhage.  2. Background of mild chronic microvascular ischemic changes and parenchymal volume loss of the brain.  3. No large vessel occlusion or high-grade stenosis of the circle of Willis.  4. Intracranial atherosclerosis with multiple areas of mild stenosis most pronounced in proximal PCA and left proximal A1 segments.  5. 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 Focal hypodensity within the left paramedian pons, suspicious for evolving acute ischemic infarct.  2. ASPECTS is 10  3. Age-related cerebral atrophy with mild chronic small vessel ischemic disease.   CEREBRAL ANGIOGRAM [id number]  (Custom)]      [] Hide copied text [] Hover for attribution information S/P 4 Vessel cerebral arteriogram. RT CFA approach. Findings. 1.Approx 65mm x 3.27mm Lt MCA inf division aneurysm. 2.Approc 2.73mm x 2mm RT PCOM anerysm.  3.Approx 60% stenosis of RT ICA prox , with severe stenosis of RT ECA prox 4.Approx 750 % stenosis of LT MCA m1 seg prox to the trifurcation       PHYSICAL EXAM Obese middle aged african american lady not in distress. . Afebrile. Head is nontraumatic. Neck is supple without bruit.    Cardiac exam no murmur or gallop. Lungs are clear to auscultation. Distal pulses are well felt. Neurological Exam :   Awake alert oriented x 3 normal speech and language. Mild right lower face asymmetry. Tongue midline. No drift. Mild diminished fine finger movements on right. Orbits left over right upper extremity. Mild right grip weak.. Normal sensation . Normal coordination.Gait deferred       ASSESSMENT/PLAN Deborah Wise is a 66 y.o. female with history of hypertension, hepatitis, thrombocytopenia, diabetes mellitus, and substance abuse presenting with unsteadiness and left hemiparesthesias. She did not receive IV t-PA due to late presentation.  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 Left 1-39% ICA stenosis.  2D Echo - EF 60-65%.  LDL - 128  HgbA1c - 5.4  VTE prophylaxis - Lovenox Diet Heart Room service appropriate? Yes; Fluid consistency: Thin  No antithrombotic prior to admission, now on aspirin 325 mg daily  Patient counseled to be compliant with her antithrombotic medications  Ongoing aggressive stroke risk factor management  Therapy recommendations:   CIR recommended  Disposition:  Pending  Hypertension  Blood pressure tends to run high  In consideration of the patient's cerebral aneurysm would try to normalize blood pressure at this point.  Long-term BP goal normotensive  Hyperlipidemia  Home meds:  No lipid lowering medications prior to admission  LDL 128, goal < 70  Now on atorvastatin 80 mg daily  Continue statin at discharge  Diabetes  HgbA1c 5.4, goal < 7.0  Controlled  Other Stroke Risk Factors  Advanced age  ETOH use, advised to abstain from alcohol.  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  Hospital day # 4      I have personally examined this patient, reviewed notes, independently viewed imaging studies, participated in medical decision making and plan of care.ROS completed by me personally and pertinent positives fully documented  I have made any additions or clarifications directly to the above note. Agree with note above. Complex situation. Patient presented with left paramedian pontine infarct from small vessel disease and has mild right hemiparesis but also has asymptomatic extracranial carotid stenosis as well as 4 mm left MCA bifurcation aneurysm. Recommend conservative f/u as outpatient for both  carotid stenosis as well as left MCA aneurysm with [facility] and Dr Estanislado Pandy . Patient counseled to quit cocaine and smoking and recommend  aspirin for stroke prevention and aggressive risk factor modification. Greater than 50% time during this 25 minute  visit was spent on counseling and coordination of care.stroke risk, evaluation, and pain aneurysm and carotid stenosis treatment option discussion. F/u in stroke clinic in 6 weeks.Stroke team will sign off. D/w family practice intern.  Antony Contras, MD Medical Director [facility] Pager: [phone] 07/01/2016 12:59 PM   To contact Stroke Continuity provider, please refer to [email or web address] After hours, contact General Neurology

## 2016-07-01 NOTE — Clinical Social Work Note (Signed)
CSW met with patient. No supports at bedside. Discussed SNF backup plan as well as Medicaid barriers: Patient has to pay out of pocket for therapies, she has to stay a minimum of 30 days for Medicaid to pay, and her Medicaid check will go directly to the facility. Patient prefers to return home if she cannot go to CIR. RNCM notified.  CSW signing off. Consult again if any social work needs arise.  Dayton Scrape, [place]

## 2016-07-01 NOTE — Telephone Encounter (Signed)
-----   Message from Mena Goes, RN sent at 07/01/2016  9:12 AM EST ----- Regarding: cancel and reschedule   ----- Message ----- From: Serafina Mitchell, MD Sent: 07/01/2016   7:57 AM To: [facility] Charge Pool  Change follow up to 6 months with a carotid duplex

## 2016-07-01 NOTE — Telephone Encounter (Signed)
still no VM set up at home # mailed second letter for new appt date and time 7/23 [place] 10, new pt 10:30

## 2016-07-02 ENCOUNTER — Encounter (HOSPITAL_COMMUNITY): Payer: Self-pay

## 2016-07-02 ENCOUNTER — Inpatient Hospital Stay (HOSPITAL_COMMUNITY)
Admission: RE | Admit: 2016-07-02 | Discharge: 2016-07-08 | DRG: 057 | Disposition: A | Discharge status: Home Health | Payer: Medicaid Other | Source: Intra-hospital | Attending: Physical Medicine & Rehabilitation | Admitting: Physical Medicine & Rehabilitation

## 2016-07-02 DIAGNOSIS — F1010 Alcohol abuse, uncomplicated: Secondary | ICD-10-CM | POA: Diagnosis present

## 2016-07-02 DIAGNOSIS — E119 Type 2 diabetes mellitus without complications: Secondary | ICD-10-CM | POA: Diagnosis present

## 2016-07-02 DIAGNOSIS — Z7984 Long term (current) use of oral hypoglycemic drugs: Secondary | ICD-10-CM | POA: Diagnosis not present

## 2016-07-02 DIAGNOSIS — I63012 Cerebral infarction due to thrombosis of left vertebral artery: Secondary | ICD-10-CM

## 2016-07-02 DIAGNOSIS — I6521 Occlusion and stenosis of right carotid artery: Secondary | ICD-10-CM | POA: Diagnosis present

## 2016-07-02 DIAGNOSIS — D6959 Other secondary thrombocytopenia: Secondary | ICD-10-CM | POA: Diagnosis present

## 2016-07-02 DIAGNOSIS — Z79899 Other long term (current) drug therapy: Secondary | ICD-10-CM

## 2016-07-02 DIAGNOSIS — K7030 Alcoholic cirrhosis of liver without ascites: Secondary | ICD-10-CM | POA: Diagnosis present

## 2016-07-02 DIAGNOSIS — R300 Dysuria: Secondary | ICD-10-CM | POA: Diagnosis not present

## 2016-07-02 DIAGNOSIS — I671 Cerebral aneurysm, nonruptured: Secondary | ICD-10-CM | POA: Diagnosis present

## 2016-07-02 DIAGNOSIS — E785 Hyperlipidemia, unspecified: Secondary | ICD-10-CM | POA: Diagnosis present

## 2016-07-02 DIAGNOSIS — W19XXXD Unspecified fall, subsequent encounter: Secondary | ICD-10-CM | POA: Diagnosis present

## 2016-07-02 DIAGNOSIS — B1920 Unspecified viral hepatitis C without hepatic coma: Secondary | ICD-10-CM | POA: Diagnosis present

## 2016-07-02 DIAGNOSIS — I10 Essential (primary) hypertension: Secondary | ICD-10-CM | POA: Diagnosis present

## 2016-07-02 DIAGNOSIS — I6350 Cerebral infarction due to unspecified occlusion or stenosis of unspecified cerebral artery: Secondary | ICD-10-CM | POA: Diagnosis not present

## 2016-07-02 DIAGNOSIS — N390 Urinary tract infection, site not specified: Secondary | ICD-10-CM | POA: Diagnosis not present

## 2016-07-02 DIAGNOSIS — I639 Cerebral infarction, unspecified: Secondary | ICD-10-CM | POA: Diagnosis present

## 2016-07-02 DIAGNOSIS — G8191 Hemiplegia, unspecified affecting right dominant side: Secondary | ICD-10-CM | POA: Diagnosis not present

## 2016-07-02 DIAGNOSIS — I69351 Hemiplegia and hemiparesis following cerebral infarction affecting right dominant side: Principal | ICD-10-CM

## 2016-07-02 DIAGNOSIS — F329 Major depressive disorder, single episode, unspecified: Secondary | ICD-10-CM | POA: Diagnosis present

## 2016-07-02 LAB — CBC
HCT: 33.7 % — ABNORMAL LOW (ref 36.0–46.0)
Hemoglobin: 10.8 g/dL — ABNORMAL LOW (ref 12.0–15.0)
MCH: 24.8 pg — ABNORMAL LOW (ref 26.0–34.0)
MCHC: 32 g/dL (ref 30.0–36.0)
MCV: 77.3 fL — ABNORMAL LOW (ref 78.0–100.0)
Platelets: 95 10*3/uL — ABNORMAL LOW (ref 150–400)
RBC: 4.36 MIL/uL (ref 3.87–5.11)
RDW: 16.3 % — ABNORMAL HIGH (ref 11.5–15.5)
WBC: 12.4 10*3/uL — ABNORMAL HIGH (ref 4.0–10.5)

## 2016-07-02 LAB — CREATININE, SERUM
Creatinine, Ser: 0.73 mg/dL (ref 0.44–1.00)
GFR calc Af Amer: >60 mL/min (ref >60)
GFR calc non Af Amer: >60 mL/min (ref >60)

## 2016-07-02 MED ORDER — HYDROCHLOROTHIAZIDE 25 MG PO TABS: 25.0000 mg | ORAL_TABLET | Freq: Every day | ORAL | 0 refills | Status: DC

## 2016-07-02 MED ORDER — ENOXAPARIN SODIUM 40 MG/0.4ML ~~LOC~~ SOLN: 40.0000 mg | SUBCUTANEOUS | Status: DC

## 2016-07-02 MED ORDER — VITAMIN B-1 100 MG PO TABS
100.0000 mg | ORAL_TABLET | Freq: Every day | ORAL | Status: DC
  Administered 2016-07-03 – 2016-07-08 (×6): 100 mg via ORAL
  Filled 2016-07-02 (×6): qty 1

## 2016-07-02 MED ORDER — AMLODIPINE BESYLATE 10 MG PO TABS
10.0000 mg | ORAL_TABLET | Freq: Every day | ORAL | Status: DC
  Administered 2016-07-03 – 2016-07-08 (×6): 10 mg via ORAL
  Filled 2016-07-02 (×6): qty 1

## 2016-07-02 MED ORDER — ATORVASTATIN CALCIUM 80 MG PO TABS
80.0000 mg | ORAL_TABLET | Freq: Every day | ORAL | Status: DC
  Administered 2016-07-02 – 2016-07-07 (×6): 80 mg via ORAL
  Filled 2016-07-02 (×7): qty 1

## 2016-07-02 MED ORDER — ENOXAPARIN SODIUM 40 MG/0.4ML ~~LOC~~ SOLN
40.0000 mg | Freq: Every day | SUBCUTANEOUS | Status: DC
  Administered 2016-07-03 – 2016-07-08 (×6): 40 mg via SUBCUTANEOUS
  Filled 2016-07-02 (×6): qty 0.4

## 2016-07-02 MED ORDER — ACETAMINOPHEN 325 MG PO TABS
325.0000 mg | ORAL_TABLET | ORAL | Status: DC | PRN
  Administered 2016-07-02 – 2016-07-06 (×4): 650 mg via ORAL
  Filled 2016-07-02 (×4): qty 2

## 2016-07-02 MED ORDER — ATORVASTATIN CALCIUM 80 MG PO TABS: 80.0000 mg | ORAL_TABLET | Freq: Every day | ORAL | 0 refills | Status: DC

## 2016-07-02 MED ORDER — FOLIC ACID 1 MG PO TABS
1.0000 mg | ORAL_TABLET | Freq: Every day | ORAL | Status: DC
  Administered 2016-07-03 – 2016-07-08 (×6): 1 mg via ORAL
  Filled 2016-07-02 (×6): qty 1

## 2016-07-02 MED ORDER — SENNOSIDES-DOCUSATE SODIUM 8.6-50 MG PO TABS: 1.0000 | ORAL_TABLET | Freq: Every evening | ORAL | Status: DC | PRN

## 2016-07-02 MED ORDER — ONDANSETRON HCL 4 MG/2ML IJ SOLN: 4.0000 mg | Freq: Four times a day (QID) | INTRAMUSCULAR | Status: DC | PRN

## 2016-07-02 MED ORDER — ONDANSETRON HCL 4 MG PO TABS: 4.0000 mg | ORAL_TABLET | Freq: Four times a day (QID) | ORAL | Status: DC | PRN

## 2016-07-02 MED ORDER — HYDROCHLOROTHIAZIDE 25 MG PO TABS
25.0000 mg | ORAL_TABLET | Freq: Every day | ORAL | Status: DC
  Administered 2016-07-03 – 2016-07-08 (×6): 25 mg via ORAL
  Filled 2016-07-02 (×6): qty 1

## 2016-07-02 MED ORDER — ASPIRIN 325 MG PO TBEC: 325.0000 mg | DELAYED_RELEASE_TABLET | Freq: Every day | ORAL | 0 refills | Status: DC

## 2016-07-02 MED ORDER — SORBITOL 70 % SOLN
30.0000 mL | Freq: Every day | Status: DC | PRN
  Administered 2016-07-04: 30 mL via ORAL
  Filled 2016-07-02: qty 30

## 2016-07-02 MED ORDER — ASPIRIN EC 325 MG PO TBEC
325.0000 mg | DELAYED_RELEASE_TABLET | Freq: Every day | ORAL | Status: DC
  Administered 2016-07-03 – 2016-07-08 (×6): 325 mg via ORAL
  Filled 2016-07-02 (×6): qty 1

## 2016-07-02 NOTE — Progress Notes (Signed)
Pt arrived to floor at 1415. Pt A&O x 3, no behaviors noted, no c/o pain, pt friend Mr. Hopper at bedside, skin intact, SR up x 2, bed in low position, call bell in reach.

## 2016-07-02 NOTE — Care Management Note (Signed)
Case Management Note  Patient Details  Name: Deborah Wise MRN: [id number] Date of Birth: [birth year]  Subjective/Objective:                    Action/Plan: Pt discharging to CIR today. No further needs per CM.   Expected Discharge Date:  07/02/16               Expected Discharge Plan:  IP Rehab Facility  In-House Referral:  Clinical Social Work  Discharge planning Services  CM Consult  Post Acute Care Choice:    Choice offered to:     DME Arranged:    DME Agency:     HH Arranged:    [place] Agency:     Status of Service:  Completed, signed off  If discussed at [employer], dates discussed:    Additional Comments:  Pollie Friar, RN 07/02/2016, 1:27 PM

## 2016-07-02 NOTE — Progress Notes (Signed)
[name], MD Physician Signed Physical Medicine and Rehabilitation  Consult Note Date of Service: 06/29/2016 2:13 PM  Related encounter: ED to Hosp-Admission (Current) from 06/27/2016 in [facility] All Collapse All   [] Hide copied text [] Hover for attribution information      Physical Medicine and Rehabilitation Consult Reason for Consult: Left pontine infarct Referring Physician: Family practice   HPI: Deborah Wise is a 66 y.o. right handed female with history of diabetes mellitus, hypertension, hepatitis C, alcohol abuse with liver cirrhosis and thrombocytopenia. Per chart review patient lives alone independently with occasional prior to admission. One level home. 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 She has a boyfriend that can assist. Presented 06/27/2016 with right-sided weakness, slurred speech and fall. Urine drug screen positive for cocaine. Cranial CT scan showed focal hypodensity within the left paramedian pons, suspicious for evolving acute ischemic infarct. Patient did not receive TPA. CT angiogram head and neck negative for emergent large vessel occlusion. 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 MRI 06/28/2016 with left paramedian pontine acute infarct stable in comparison to prior CT. No hemorrhage. Echocardiogram with ejection fraction 18% grade 1 diastolic dysfunction. No regional wall motion abnormalities. Carotid Dopplers with right 60-79% ICA stenosis. Aspirin for CVA prophylaxis. Subcutaneous Lovenox for DVT prophylaxis. Tolerating a regular diet. Physical and occupational therapy evaluations completed with recommendations of physical medicine rehabilitation consult.   Review of Systems  Constitutional: Negative for chills and fever.  HENT: Negative for hearing loss and tinnitus.   Eyes: Negative for  blurred vision and double vision.  Respiratory: Negative for cough and shortness of breath.   Cardiovascular: Positive for leg swelling. Negative for chest pain and palpitations.  Gastrointestinal: Positive for constipation. Negative for nausea.       GERD  Genitourinary: Negative for flank pain and hematuria.       Occasional stress incontinence  Musculoskeletal: Positive for falls, joint pain and myalgias.  Skin: Negative for rash.  Neurological: Positive for speech change and weakness. Negative for seizures.       Occasional dizziness  All other systems reviewed and are negative.      Past Medical History:  Diagnosis Date  . Arthritis   . Diabetes mellitus without complication ([place])   . Fibroid, uterine   . GERD (gastroesophageal reflux disease)   . Hepatitis    hep C  . Hypertension   . Thrombocytopenia ([place]) 12/25/2014  . Urinary incontinence    Patient noticed mild and is not currently a significant problem        Past Surgical History:  Procedure Laterality Date  . ANKLE SURGERY    . broken ankle    . MULTIPLE EXTRACTIONS WITH ALVEOLOPLASTY  10/25/2011   Procedure: MULTIPLE EXTRACION WITH ALVEOLOPLASTY;  Surgeon: Gae Bon, DDS;  Location: [facility];  Service: Oral Surgery;  Laterality: Bilateral;  Extract teeth numbers one, two, six, seven, eight, nine, ten, eleven, twelve, fifteen, sixteen, eighteen, nineteen, twenty-three, twenty-four, twenty-five, twenty-seven, thirty-two and alveoplasty.   Family History  Problem Relation Age of Onset  . Diabetes type II Mother   . Hypertension Mother   . Cancer Father     patient does not know what type of cancer   Social History:  reports that she has never smoked. She has never used smokeless tobacco.  She reports that she drinks about 0.6 oz of alcohol per week . She reports that she does not use drugs. Allergies: No Known Allergies       Medications Prior to Admission  Medication Sig Dispense Refill   . amLODipine (NORVASC) 10 MG tablet Take 1 tablet (10 mg total) by mouth daily. 30 tablet 0  . Oxycodone HCl 10 MG TABS Take 10 mg by mouth 3 (three) times daily as needed for pain.  0  . ACCU-CHEK AVIVA PLUS test strip USE TO CHECK BLOOD SUGAR TWICE A DAY  3  . glucose monitoring kit (FREESTYLE) monitoring kit 1 each by Does not apply route 4 (four) times daily - after meals and at bedtime. 1 month Diabetic Testing Supplies for QAC-QHS accuchecks. Any brand OK 1 each 1  . metFORMIN (GLUCOPHAGE) 500 MG tablet Take 500 mg by mouth daily with breakfast.     . oxyCODONE (ROXICODONE) 5 MG immediate release tablet Take 1 tablet (5 mg total) by mouth every 4 (four) hours as needed for severe pain. (Patient not taking: Reported on 06/27/2016) 8 tablet 0  . thiamine (VITAMIN B-1) 100 MG tablet Take 1 tablet (100 mg total) by mouth daily. (Patient not taking: Reported on 06/27/2016) 30 tablet 0    Home: Home Living Family/patient expects to be discharged to:: Private residence Living Arrangements: Alone Available Help at Discharge: Family, Available PRN/intermittently Type of Home: Apartment Home Access: Stairs to enter Technical brewer of Steps: flight Entrance Stairs-Rails: Can reach both Home Layout: One level Bathroom Shower/Tub: Chiropodist: Standard Home Equipment: Cane - single point  Functional History: Prior Function Level of Independence: Independent Comments: 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  Functional Status:  Mobility: Bed Mobility General bed mobility comments: received OOB Transfers Overall transfer level: Needs assistance Equipment used: None Transfers: Sit to/from Stand Sit to Stand: Min guard General transfer comment: min guard assist for stability in elevation to standing Ambulation/Gait Ambulation/Gait assistance: Min assist Ambulation Distance (Feet): 90 Feet Assistive device: 1 person hand held assist Gait  Pattern/deviations: Step-through pattern, Decreased stride length, Decreased stance time - right, Narrow base of support General Gait Details: patient with some instability during ambulation, ocassional right leg lag noted with increased instability. Multiple balance checks with min assist for stability Gait velocity: decreased Gait velocity interpretation: Below normal speed for age/gender  ADL: ADL Overall ADL's : Needs assistance/impaired Eating/Feeding: Modified independent, Sitting Grooming: Brushing hair, Oral care, Standing, Min guard Grooming Details (indicate cue type and reason): pt able to open containers and apply toothpaste to toothbrush Upper Body Bathing: Set up, Sitting Lower Body Bathing: Minimal assistance, Sit to/from stand Lower Body Bathing Details (indicate cue type and reason): assist for balance Upper Body Dressing : Minimal assistance, Sitting Lower Body Dressing: Minimal assistance, Sit to/from stand Lower Body Dressing Details (indicate cue type and reason): able to don and doff socks, min assist for balance with standing Toilet Transfer: Minimal assistance, Ambulation Toileting- Clothing Manipulation and Hygiene: Minimal assistance, Sit to/from stand Functional mobility during ADLs: Minimal assistance  Cognition: Cognition Overall Cognitive Status: Impaired/Different from baseline Orientation Level: Oriented X4 Cognition Arousal/Alertness: Awake/alert Behavior During Therapy: Impulsive Overall Cognitive Status: Impaired/Different from baseline Area of Impairment: Safety/judgement Safety/Judgement: Decreased awareness of safety, Decreased awareness of deficits General Comments: pt attempting to walk with lines attached to monitor  Blood pressure (!) 167/72, pulse 68, temperature 97.5 F (36.4 C), temperature source Oral, resp. rate 20, height 5'  5" (1.651 m), weight 74 kg (163 lb 2.3 oz), SpO2 100 %. Physical Exam  Vitals reviewed. HENT:  Head:  Normocephalic.  Eyes: EOM are normal.  Neck: Normal range of motion. Neck supple. No thyromegaly present.  Cardiovascular: Normal rate and normal heart sounds.   Respiratory: Effort normal and breath sounds normal. No respiratory distress.  GI: Soft. Bowel sounds are normal. She exhibits no distension. There is no tenderness.  Neurological: She is alert.  Makes good eye contact with examiner. Follows simple commands. She can provide her name and age and date of birth. Fair awareness of deficits. Right central 7 and tongue deviation. Speech is very dysarthric. RUE: 3 to 3+/5 with poor fine motor coordination. She could touch her nose. RLE: 3+HF, KE and 4/5 ADF/PF again with poor coordination  Skin: Skin is warm and dry.  Psychiatric: She has a normal mood and affect. Her behavior is normal.    Lab Results Last 24 Hours       Results for orders placed or performed during the hospital encounter of 06/27/16 (from the past 24 hour(s))  MRSA PCR Screening     Status: None   Collection Time: 06/28/16  4:11 PM  Result Value Ref Range   MRSA by PCR NEGATIVE NEGATIVE  CBC     Status: Abnormal   Collection Time: 06/29/16  2:56 AM  Result Value Ref Range   WBC 6.2 4.0 - 10.5 K/uL   RBC 4.06 3.87 - 5.11 MIL/uL   Hemoglobin 10.0 (L) 12.0 - 15.0 g/dL   HCT 30.9 (L) 36.0 - 46.0 %   MCV 76.1 (L) 78.0 - 100.0 fL   MCH 24.6 (L) 26.0 - 34.0 pg   MCHC 32.4 30.0 - 36.0 g/dL   RDW 16.4 (H) 11.5 - 15.5 %   Platelets 95 (L) 150 - 400 K/uL  Basic metabolic panel     Status: Abnormal   Collection Time: 06/29/16  2:56 AM  Result Value Ref Range   Sodium 137 135 - 145 mmol/L   Potassium 3.3 (L) 3.5 - 5.1 mmol/L   Chloride 106 101 - 111 mmol/L   CO2 25 22 - 32 mmol/L   Glucose, Bld 119 (H) 65 - 99 mg/dL   BUN 14 6 - 20 mg/dL   Creatinine, Ser 0.75 0.44 - 1.00 mg/dL   Calcium 9.3 8.9 - 10.3 mg/dL   GFR calc non Af Amer >60 >60 mL/min   GFR calc Af Amer >60 >60 mL/min   Anion  gap 6 5 - 15      Imaging Results (Last 48 hours)  Ct Angio Head W Or Wo Contrast  Result Date: 06/27/2016 CLINICAL DATA:  Initial evaluation for acute right-sided weakness. EXAM: CT ANGIOGRAPHY HEAD AND NECK TECHNIQUE: Multidetector CT imaging of the head and neck was performed using the standard protocol during bolus administration of intravenous contrast. Multiplanar CT image reconstructions and MIPs were obtained to evaluate the vascular anatomy. Carotid stenosis measurements (when applicable) are obtained utilizing NASCET criteria, using the distal internal carotid diameter as the denominator. CONTRAST:  50 cc of Isovue 370. COMPARISON:  Prior head CT from earlier the same day. FINDINGS: CTA NECK FINDINGS Aortic arch: Visualized aortic arch of normal caliber with normal 3 vessel morphology. Focal plaque at the origin of the left subclavian artery without significant stenosis. No high-grade stenosis at the origin of the great vessels. Visualized subclavian arteries widely patent. Right carotid system: Right common carotid artery widely patent from its  origin to the bifurcation. Calcified and noncalcified plaque about the right bifurcation/ proximal right ICA. Associated severe short-segment stenosis of approximately 80% by NASCET criteria (series 7, image 204). Area affected begins at the bifurcation and measures approximately 13 mm in length. Distally, right ICA patent to the skullbase without stenosis, dissection, or occlusion. No made of fairly severe atheromatous stenosis at the origin of the right external carotid artery is well. Left carotid system: Left common carotid artery patent from its origin to the bifurcation without flow-limiting stenosis. Scattered eccentric calcified plaque noted within the distal left common carotid artery. Calcified plaque at the left bifurcation/proximal left ICA. Associated short-segment stenosis of approximately 40% by NASCET criteria. Distally, left ICA patent to  the skullbase without stenosis, dissection, or occlusion. Calcified plaque at the origin of the left external carotid artery without high-grade stenosis. Vertebral arteries: Both of the vertebral arteries arise from the subclavian arteries. Left vertebral artery dominant. Right vertebral artery diffusely hypoplastic. Focal plaque near the origin of the left vertebral artery without flow-limiting stenosis. Vertebral arteries patent within the neck without stenosis, dissection, or occlusion. Skeleton: No acute osseous abnormality. No worrisome lytic or blastic osseous lesions. Moderate degenerative spondylolysis present at C5-6 and C6-7. Other neck: Soft tissues of the neck demonstrate no acute abnormality. No adenopathy. Upper chest: Visualized upper mediastinum within normal limits. Visualized lungs are clear. Review of the MIP images confirms the above findings CTA HEAD FINDINGS Anterior circulation: Petrous segments widely patent. Scattered atheromatous plaque throughout the cavernous/ supraclinoid ICAs with mild to moderate diffuse narrowing. Right A1 segment patent. Moderate narrowing at the origin of the left A1 segment. Anterior communicating artery patent. Anterior cerebral arteries demonstrate multifocal atheromatous irregularity. There are superimposed severe stenoses (series 7, image 83 on the right, in 01/1983 on the left). PCAs are patent to their distal aspects. M1 segments demonstrate mild atheromatous irregularity without flow-limiting stenosis or occlusion. There is a saccular aneurysm measuring 4 x 3 x 4 mm arising from the left MCA bifurcation (series 7, image 110). No proximal M2 occlusion. Multifocal atheromatous irregularity throughout the MCA branches bilaterally. Posterior circulation: Dominant left vertebral artery widely patent to the vertebrobasilar junction. Diminutive right vertebral artery patent as well. Posterior inferior cerebral arteries patent bilaterally. Basilar artery  demonstrates mild multifocal atheromatous irregularity without flow-limiting stenosis. Superior cerebral arteries patent bilaterally. PCAs primarily supplied via the basilar artery. Multifocal atheromatous regularity within the left P1 segment with moderate stenoses. Additional atheromatous irregularity within the PCAs distally. There are associated multifocal moderate to severe stenoses within the right P2 segment, which is somewhat attenuated as compared to the left. PCAs are supplied to their distal aspects. Venous sinuses: Patent. Anatomic variants: No significant anatomic variant. Delayed phase: Not performed. Review of the MIP images confirms the above findings IMPRESSION: 1. Negative CTA for emergent large vessel occlusion. Evolving left pontine infarct stable from previous CT. 2. 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 3. Moderate atheromatous disease involving the anterior and posterior circulations as above, most notable within the distal ACAs and right PCA. 4. 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 Critical Value/emergent results were called by telephone at the time of interpretation on 06/27/2016 at 9:07 pm to Dr. Roland Rack , who verbally acknowledged these results. Electronically Signed   By: Jeannine Boga M.D.   On: 06/27/2016 21:10   Ct Angio Neck W Or Wo Contrast  Result Date:  06/27/2016 CLINICAL DATA:  Initial evaluation for acute right-sided weakness. EXAM: CT ANGIOGRAPHY HEAD AND NECK TECHNIQUE: Multidetector CT imaging of the head and neck was performed using the standard protocol during bolus administration of intravenous contrast. Multiplanar CT image reconstructions and MIPs were obtained to evaluate the vascular anatomy. Carotid stenosis measurements (when applicable) are obtained utilizing NASCET criteria, using the distal internal carotid diameter as the denominator. CONTRAST:  50 cc  of Isovue 370. COMPARISON:  Prior head CT from earlier the same day. FINDINGS: CTA NECK FINDINGS Aortic arch: Visualized aortic arch of normal caliber with normal 3 vessel morphology. Focal plaque at the origin of the left subclavian artery without significant stenosis. No high-grade stenosis at the origin of the great vessels. Visualized subclavian arteries widely patent. Right carotid system: Right common carotid artery widely patent from its origin to the bifurcation. Calcified and noncalcified plaque about the right bifurcation/ proximal right ICA. Associated severe short-segment stenosis of approximately 80% by NASCET criteria (series 7, image 204). Area affected begins at the bifurcation and measures approximately 13 mm in length. Distally, right ICA patent to the skullbase without stenosis, dissection, or occlusion. No made of fairly severe atheromatous stenosis at the origin of the right external carotid artery is well. Left carotid system: Left common carotid artery patent from its origin to the bifurcation without flow-limiting stenosis. Scattered eccentric calcified plaque noted within the distal left common carotid artery. Calcified plaque at the left bifurcation/proximal left ICA. Associated short-segment stenosis of approximately 40% by NASCET criteria. Distally, left ICA patent to the skullbase without stenosis, dissection, or occlusion. Calcified plaque at the origin of the left external carotid artery without high-grade stenosis. Vertebral arteries: Both of the vertebral arteries arise from the subclavian arteries. Left vertebral artery dominant. Right vertebral artery diffusely hypoplastic. Focal plaque near the origin of the left vertebral artery without flow-limiting stenosis. Vertebral arteries patent within the neck without stenosis, dissection, or occlusion. Skeleton: No acute osseous abnormality. No worrisome lytic or blastic osseous lesions. Moderate degenerative spondylolysis present at  C5-6 and C6-7. Other neck: Soft tissues of the neck demonstrate no acute abnormality. No adenopathy. Upper chest: Visualized upper mediastinum within normal limits. Visualized lungs are clear. Review of the MIP images confirms the above findings CTA HEAD FINDINGS Anterior circulation: Petrous segments widely patent. Scattered atheromatous plaque throughout the cavernous/ supraclinoid ICAs with mild to moderate diffuse narrowing. Right A1 segment patent. Moderate narrowing at the origin of the left A1 segment. Anterior communicating artery patent. Anterior cerebral arteries demonstrate multifocal atheromatous irregularity. There are superimposed severe stenoses (series 7, image 83 on the right, in 01/1983 on the left). PCAs are patent to their distal aspects. M1 segments demonstrate mild atheromatous irregularity without flow-limiting stenosis or occlusion. There is a saccular aneurysm measuring 4 x 3 x 4 mm arising from the left MCA bifurcation (series 7, image 110). No proximal M2 occlusion. Multifocal atheromatous irregularity throughout the MCA branches bilaterally. Posterior circulation: Dominant left vertebral artery widely patent to the vertebrobasilar junction. Diminutive right vertebral artery patent as well. Posterior inferior cerebral arteries patent bilaterally. Basilar artery demonstrates mild multifocal atheromatous irregularity without flow-limiting stenosis. Superior cerebral arteries patent bilaterally. PCAs primarily supplied via the basilar artery. Multifocal atheromatous regularity within the left P1 segment with moderate stenoses. Additional atheromatous irregularity within the PCAs distally. There are associated multifocal moderate to severe stenoses within the right P2 segment, which is somewhat attenuated as compared to the left. PCAs are supplied to their distal aspects. Venous sinuses:  Patent. Anatomic variants: No significant anatomic variant. Delayed phase: Not performed. Review of the MIP  images confirms the above findings IMPRESSION: 1. Negative CTA for emergent large vessel occlusion. Evolving left pontine infarct stable from previous CT. 2. 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 3. Moderate atheromatous disease involving the anterior and posterior circulations as above, most notable within the distal ACAs and right PCA. 4. 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 Critical Value/emergent results were called by telephone at the time of interpretation on 06/27/2016 at 9:07 pm to Dr. Roland Rack , who verbally acknowledged these results. Electronically Signed   By: Jeannine Boga M.D.   On: 06/27/2016 21:10   Mr Deborah Wise Head Wo Contrast  Result Date: 06/28/2016 CLINICAL DATA:  66 y/o F; status post fall, right hemi paresis, a aphasia. EXAM: MRI HEAD WITHOUT CONTRAST MRA HEAD WITHOUT CONTRAST TECHNIQUE: Multiplanar, multiecho pulse sequences of the brain and surrounding structures were obtained without intravenous contrast. Angiographic images of the head were obtained using MRA technique without contrast. COMPARISON:  CT head and CT angiogram head and neck dated 06/27/2016. FINDINGS: MRI HEAD FINDINGS Brain: Left paramedian pons focus of acute diffusion restriction similar in distribution to hypodensity on CT compatible with acute infarction. Few foci of T2 FLAIR hyperintensity in subcortical and periventricular white matter compatible with mild chronic microvascular ischemic changes. Mild diffuse brain parenchymal volume loss. Small chronic lacunar infarct within left caudate body. Single punctate focus of susceptibility hypointensity within the left temporal lobe is compatible with hemosiderin deposition of old microhemorrhage. No focal mass effect, extra-axial collection, or hydrocephalus. Vascular: As below. Skull and upper cervical spine: Normal marrow signal. Sinuses/Orbits: Negative. Other: None.  MRA HEAD FINDINGS Internal carotid arteries: Prominent infundibular origin of right anterior choroidal artery. Left MCA bifurcation 3 x 4 mm aneurysm as seen on prior CT angiogram (series 6, image 101). Anterior cerebral arteries:  Patent. Middle cerebral arteries: Patent. Anterior communicating artery: Patent. Posterior communicating arteries:  Patent. Posterior cerebral arteries:  Patent. Basilar artery:  Patent. Vertebral arteries:  Patent. No large vessel occlusion or high-grade stenosis. Intracranial atherosclerosis with multiple segments of mild stenosis in the anterior and posterior circulation most pronounced and proximal PCA and left proximal A1. IMPRESSION: 1. Left paramedian pontine acute infarction is stable in distribution comparing with prior CT given differences in technique. No hemorrhage. 2. Background of mild chronic microvascular ischemic changes and parenchymal volume loss of the brain. 3. No large vessel occlusion or high-grade stenosis of the circle of Willis. 4. Intracranial atherosclerosis with multiple areas of mild stenosis most pronounced in proximal PCA and left proximal A1 segments. 5. 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   On: 06/28/2016 04:38   Mr Brain Wo Contrast  Result Date: 06/28/2016 CLINICAL DATA:  66 y/o F; status post fall, right hemi paresis, a aphasia. EXAM: MRI HEAD WITHOUT CONTRAST MRA HEAD WITHOUT CONTRAST TECHNIQUE: Multiplanar, multiecho pulse sequences of the brain and surrounding structures were obtained without intravenous contrast. Angiographic images of the head were obtained using MRA technique without contrast. COMPARISON:  CT head and CT angiogram head and neck dated 06/27/2016. FINDINGS: MRI HEAD FINDINGS Brain: Left paramedian pons focus of acute diffusion restriction similar in distribution to hypodensity on CT compatible with acute infarction. Few foci of T2 FLAIR hyperintensity in  subcortical and periventricular white matter compatible with  mild chronic microvascular ischemic changes. Mild diffuse brain parenchymal volume loss. Small chronic lacunar infarct within left caudate body. Single punctate focus of susceptibility hypointensity within the left temporal lobe is compatible with hemosiderin deposition of old microhemorrhage. No focal mass effect, extra-axial collection, or hydrocephalus. Vascular: As below. Skull and upper cervical spine: Normal marrow signal. Sinuses/Orbits: Negative. Other: None. MRA HEAD FINDINGS Internal carotid arteries: Prominent infundibular origin of right anterior choroidal artery. Left MCA bifurcation 3 x 4 mm aneurysm as seen on prior CT angiogram (series 6, image 101). Anterior cerebral arteries:  Patent. Middle cerebral arteries: Patent. Anterior communicating artery: Patent. Posterior communicating arteries:  Patent. Posterior cerebral arteries:  Patent. Basilar artery:  Patent. Vertebral arteries:  Patent. No large vessel occlusion or high-grade stenosis. Intracranial atherosclerosis with multiple segments of mild stenosis in the anterior and posterior circulation most pronounced and proximal PCA and left proximal A1. IMPRESSION: 1. Left paramedian pontine acute infarction is stable in distribution comparing with prior CT given differences in technique. No hemorrhage. 2. Background of mild chronic microvascular ischemic changes and parenchymal volume loss of the brain. 3. No large vessel occlusion or high-grade stenosis of the circle of Willis. 4. Intracranial atherosclerosis with multiple areas of mild stenosis most pronounced in proximal PCA and left proximal A1 segments. 5. 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   On: 06/28/2016 04:38   Ct Head Code Stroke W/o Cm  Result Date: 06/27/2016 CLINICAL DATA:  Code stroke. Initial evaluation for acute right-sided weakness. EXAM: CT HEAD  WITHOUT CONTRAST TECHNIQUE: Contiguous axial images were obtained from the base of the skull through the vertex without intravenous contrast. COMPARISON:  None. FINDINGS: Brain: Age-related cerebral atrophy with mild chronic small vessel ischemic disease. No acute intracranial hemorrhage. There is a subtle hypodensity measuring approximately 16 mm within the left paramedian pons, suspicious for evolving acute ischemic infarct (series 2, image 8). No associated mass effect. No other evidence for acute large vessel territory infarct. Gray-white matter differentiation otherwise maintained. No mass lesion, midline shift, or mass effect. No hydrocephalus. No extra-axial fluid collection. Vascular: No hyperdense vessel. Scattered vascular calcifications present within the carotid siphons. Skull: Scalp soft tissues within normal limits.  Calvarium intact. Sinuses/Orbits: Globes and orbital soft tissues within normal limits. Scattered mucosal thickening within the ethmoidal air cells. Paranasal sinuses are otherwise clear. No mastoid effusion. ASPECTS [facility] Stroke Program Early CT Score) - Ganglionic level infarction (caudate, lentiform nuclei, internal capsule, insula, M1-M3 cortex): 7 - Supraganglionic infarction (M4-M6 cortex): 3 Total score (0-10 with 10 being normal): 10 IMPRESSION: 1. Focal hypodensity within the left paramedian pons, suspicious for evolving acute ischemic infarct. 2. ASPECTS is 10 3. Age-related cerebral atrophy with mild chronic small vessel ischemic disease. Critical Value/emergent results were called by telephone at the time of interpretation on 06/27/2016 at 8:27 pm to Dr. Kathrynn Speed, who verbally acknowledged these results. Electronically Signed   By: Jeannine Boga M.D.   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 Does the need for close, 24 hr/day medical supervision in concert with the patient's  rehab needs make it unreasonable for this patient to be served in a less intensive setting? Yes 2. Co-Morbidities requiring supervision/potential complications: htn, dm 3. Due to bladder management, bowel management, safety, skin/wound care, disease management, medication administration, pain management and patient education, does the patient require 24 hr/day rehab nursing? Yes 4.  Does the patient require coordinated care of a physician, rehab nurse, PT (1-2 hrs/day, 5 days/week), OT (1-2 hrs/day, 5 days/week) and SLP (1-2 hrs/day, 5 days/week) to address physical and functional deficits in the context of the above medical diagnosis(es)? Yes Addressing deficits in the following areas: balance, endurance, locomotion, strength, transferring, bowel/bladder control, bathing, dressing, feeding, grooming, toileting, speech and psychosocial support 5. Can the patient actively participate in an intensive therapy program of at least 3 hrs of therapy per day at least 5 days per week? Yes 6. The potential for patient to make measurable gains while on inpatient rehab is excellent 7. Anticipated functional outcomes upon discharge from inpatient rehab are modified independent  with PT, modified independent with OT, modified independent with SLP. 8. Estimated rehab length of stay to reach the above functional goals is: 7 days 9. Does the patient have adequate social supports and living environment to accommodate these discharge functional goals? Yes 10. Anticipated D/C setting: Home 11. Anticipated post D/C treatments: HH therapy and Outpatient therapy 12. Overall Rehab/Functional Prognosis: excellent  RECOMMENDATIONS: This patient's condition is appropriate for continued rehabilitative care in the following setting: CIR Patient has agreed to participate in recommended program. Yes Note that insurance prior authorization may be required for reimbursement for recommended care.  Comment: Rehab Admissions  Coordinator to follow up.  Thanks,  [name], MD, [facility]    Cathlyn Parsons., PA-C 06/29/2016    Revision History                        Routing History

## 2016-07-02 NOTE — Progress Notes (Signed)
Speech Language Pathology Treatment: Cognitive-Linquistic  Patient Details Name: Deborah Wise MRN: [id number] DOB: February 29, 1951 Today's Date: 07/02/2016 Time: [id number] SLP Time Calculation (min) (ACUTE ONLY): 15 min  Assessment / Plan / Recommendation Clinical Impression  Pt seen for dysarthria treatment and review of compensatory strategies. Pt continues to present with a moderate dysarthria with imprecise articulation, which seems to worsen with increased sentence length, multisyllable words, and words containing consonant blends. Reviewed compensatory strategies with pt- overarticulation, speak louder, and open mouth wide while speaking. Pt demonstrated increased intelligibility with these strategies while reading aloud; intelligibility decreased when returning to conversation tasks. Pt will benefit from continued speech therapy at inpatient rehab to address speech intelligibility. Will continue to follow.   HPI HPI: Deborah Wise a 66 y.o.femalepresenting with right sided weakness. PMH is significant for hypertension, diabetes, hepatitis C, liver cirrhosis, alcohol use disorder andthrombocytopenia. Pt found to have Acute CVA, L pontine infarct, improving Presented with dysarthria, R facial droop, pronator drift and multiple falls 2/2 R hemiparesis over past 3 days.      SLP Plan  Continue with current plan of care     Recommendations                   Follow up Recommendations: Inpatient Rehab Plan: Continue with current plan of care       [place], Amy K, [place], CCC-SLP 07/02/2016, 1:57 PM [phone]

## 2016-07-02 NOTE — H&P (Signed)
Physical Medicine and Rehabilitation Admission H&P       Chief Complaint  Patient presents with  . Code Stroke  : HPI: Deborah Gladneyis a 66 y.o.right handed femalewith history of diabetes mellitus, hypertension, hepatitis C, alcohol abuse with liver cirrhosis and thrombocytopenia. Per chart review patient lives alone independently with occasionalprior to admission. One level home. She is acaregiver to both of her parents who are wheelchair-boundwho also have hired caregivers.She has a boyfriend that can assist.Presented 06/27/2016 with right-sided weakness, slurred speechand fall. Urine drug screen positive for cocaine. Cranial CT scan showed focal hypodensity within the left paramedian pons, suspicious for evolving acute ischemic infarct. Patient did not receive TPA. CT angiogram head and neck negative for emergent large vessel occlusion. 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 MRI 06/28/2016 with left paramedian pontine acute infarct stable in comparison to prior CT. No hemorrhage. Echocardiogram with ejection fraction 64% grade 1 diastolic dysfunction. No regional wall motion abnormalities. Carotid Dopplers with right 60-79% ICA stenosis.. Diagnostic angiogram 06/30/2016 per interventional radiology again identified right carotid stenosis measuring 60%. Plan is to follow-up outpatient vascular surgery Dr. Trula Slade in 6 weeks with repeat carotid duplex. Aspirin for CVA prophylaxis. Subcutaneous Lovenox for DVT prophylaxis. Tolerating a regular diet. Physical and occupational therapy evaluations completed with recommendations of physical medicine rehabilitation consult.Patient was admitted for comprehensive rehabilitation program  Review of Systems  Constitutional: Negative for chills and fever.  HENT: Negative for hearing loss and tinnitus.   Eyes: Negative for blurred vision and double vision.  Respiratory: Positive for cough. Negative  for shortness of breath.   Cardiovascular: Positive for leg swelling. Negative for chest pain and palpitations.  Gastrointestinal: Positive for constipation. Negative for nausea and vomiting.       GERD  Genitourinary: Negative for flank pain and hematuria.       Occasional stress incontinence  Musculoskeletal: Positive for myalgias.  Skin: Negative for rash.  Neurological: Positive for dizziness, speech change and weakness. Negative for seizures.       Past Medical History:  Diagnosis Date  . Arthritis   . Diabetes mellitus without complication ([place])   . Fibroid, uterine   . GERD (gastroesophageal reflux disease)   . Hepatitis    hep C  . Hypertension   . Thrombocytopenia ([place]) 12/25/2014  . Urinary incontinence    Patient noticed mild and is not currently a significant problem        Past Surgical History:  Procedure Laterality Date  . ANKLE SURGERY    . broken ankle    . MULTIPLE EXTRACTIONS WITH ALVEOLOPLASTY  10/25/2011   Procedure: MULTIPLE EXTRACION WITH ALVEOLOPLASTY;  Surgeon: Gae Bon, DDS;  Location: [facility];  Service: Oral Surgery;  Laterality: Bilateral;  Extract teeth numbers one, two, six, seven, eight, nine, ten, eleven, twelve, fifteen, sixteen, eighteen, nineteen, twenty-three, twenty-four, twenty-five, twenty-seven, thirty-two and alveoplasty.         Family History  Problem Relation Age of Onset  . Diabetes type II Mother   . Hypertension Mother   . Cancer Father     patient does not know what type of cancer   Social History:  reports that she has never smoked. She has never used smokeless tobacco. She reports that she drinks about 0.6 oz of alcohol per week . She reports that she does not use drugs. Allergies: No Known Allergies       Medications Prior to  Admission  Medication Sig Dispense Refill  . amLODipine (NORVASC) 10 MG tablet Take 1 tablet (10 mg total) by mouth daily. 30 tablet 0  . Oxycodone HCl 10 MG TABS Take 10  mg by mouth 3 (three) times daily as needed for pain.  0  . ACCU-CHEK AVIVA PLUS test strip USE TO CHECK BLOOD SUGAR TWICE A DAY  3  . glucose monitoring kit (FREESTYLE) monitoring kit 1 each by Does not apply route 4 (four) times daily - after meals and at bedtime. 1 month Diabetic Testing Supplies for QAC-QHS accuchecks. Any brand OK 1 each 1  . metFORMIN (GLUCOPHAGE) 500 MG tablet Take 500 mg by mouth daily with breakfast.     . oxyCODONE (ROXICODONE) 5 MG immediate release tablet Take 1 tablet (5 mg total) by mouth every 4 (four) hours as needed for severe pain. (Patient not taking: Reported on 06/27/2016) 8 tablet 0  . thiamine (VITAMIN B-1) 100 MG tablet Take 1 tablet (100 mg total) by mouth daily. (Patient not taking: Reported on 06/27/2016) 30 tablet 0    Home: Home Living Family/patient expects to be discharged to:: Private residence Living Arrangements: Alone Available Help at Discharge: Family, Available PRN/intermittently Type of Home: Apartment Home Access: Stairs to enter Technical brewer of Steps: flight Entrance Stairs-Rails: Can reach both Home Layout: One level Bathroom Shower/Tub: Chiropodist: Standard Home Equipment: Cane - single point   Functional History: Prior Function Level of Independence: Independent Comments: 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   Functional Status:  Mobility: Bed Mobility General bed mobility comments: received OOB Transfers Overall transfer level: Needs assistance Equipment used: None Transfers: Sit to/from Stand Sit to Stand: Min guard General transfer comment: min guard assist for stability in elevation to standing Ambulation/Gait Ambulation/Gait assistance: Min assist Ambulation Distance (Feet): 90 Feet Assistive device: 1 person hand held assist Gait Pattern/deviations: Step-through pattern, Decreased stride length, Decreased stance time - right, Narrow base of support General  Gait Details: patient with some instability during ambulation, ocassional right leg lag noted with increased instability. Multiple balance checks with min assist for stability Gait velocity: decreased Gait velocity interpretation: Below normal speed for age/gender  ADL: ADL Overall ADL's : Needs assistance/impaired Eating/Feeding: Modified independent, Sitting Grooming: Brushing hair, Oral care, Standing, Min guard Grooming Details (indicate cue type and reason): pt able to open containers and apply toothpaste to toothbrush Upper Body Bathing: Set up, Sitting Lower Body Bathing: Minimal assistance, Sit to/from stand Lower Body Bathing Details (indicate cue type and reason): assist for balance Upper Body Dressing : Minimal assistance, Sitting Lower Body Dressing: Minimal assistance, Sit to/from stand Lower Body Dressing Details (indicate cue type and reason): able to don and doff socks, min assist for balance with standing Toilet Transfer: Minimal assistance, Ambulation Toileting- Clothing Manipulation and Hygiene: Minimal assistance, Sit to/from stand Functional mobility during ADLs: Minimal assistance  Cognition: Cognition Overall Cognitive Status: Impaired/Different from baseline Orientation Level: Oriented X4 Cognition Arousal/Alertness: Awake/alert Behavior During Therapy: Impulsive Overall Cognitive Status: Impaired/Different from baseline Area of Impairment: Safety/judgement Safety/Judgement: Decreased awareness of safety, Decreased awareness of deficits General Comments: pt attempting to walk with lines attached to monitor  Physical Exam: Blood pressure (!) 158/68, pulse 66, temperature 98.4 F (36.9 C), temperature source Oral, resp. rate 20, height 5' 5"  (1.651 m), weight 74 kg (163 lb 2.3 oz), SpO2 100 %. Physical Exam  Vitals reviewed. Constitutional: She appears well-developed. No distress.  HENT:  Head: Normocephalic.  Right Ear: External ear normal.  Left  Ear: External ear normal.  Eyes: EOM are normal. Pupils are equal, round, and reactive to light. Left eye exhibits no discharge.  Pupils reactive to light  Neck: Normal range of motion. Neck supple. No tracheal deviation present. No thyromegaly present.  Cardiovascular: Normal rate and regular rhythm.  Exam reveals friction rub.   No murmur heard. Respiratory: Effort normal and breath sounds normal. No respiratory distress. She has no wheezes.  GI: Soft. Bowel sounds are normal. She exhibits no distension.  Skin: Skin is warm and dry. No rash noted. No erythema.  Psychiatric: She has a normal mood and affect. Her behavior is normal. Thought content normal.  Skin. Warm and dry Neurological: She is alert.  Makes good eye contact with examiner. Follows simple commands. She can provide her name and age and date of birth. Fair awareness of deficits. Right central 7 and tongue deviation present. Has good cough.  Speech remains quite dysarthric. RUE: 3 to 3+/5 deltoid, biceps, triceps, wrist, HI  with poor fine motor coordination. + pronator drift. RLE: 3+HF, KE and 4/5 ADF/PF with decreased coordination.   Lab Results Last 48 Hours        Results for orders placed or performed during the hospital encounter of 06/27/16 (from the past 48 hour(s))  MRSA PCR Screening     Status: None   Collection Time: 06/28/16  4:11 PM  Result Value Ref Range   MRSA by PCR NEGATIVE NEGATIVE    Comment:        The GeneXpert MRSA Assay (FDA approved for NASAL specimens only), is one component of a comprehensive MRSA colonization surveillance program. It is not intended to diagnose MRSA infection nor to guide or monitor treatment for MRSA infections.   CBC     Status: Abnormal   Collection Time: 06/29/16  2:56 AM  Result Value Ref Range   WBC 6.2 4.0 - 10.5 K/uL   RBC 4.06 3.87 - 5.11 MIL/uL   Hemoglobin 10.0 (L) 12.0 - 15.0 g/dL   HCT 30.9 (L) 36.0 - 46.0 %   MCV 76.1 (L) 78.0 - 100.0 fL     MCH 24.6 (L) 26.0 - 34.0 pg   MCHC 32.4 30.0 - 36.0 g/dL   RDW 16.4 (H) 11.5 - 15.5 %   Platelets 95 (L) 150 - 400 K/uL    Comment: CONSISTENT WITH PREVIOUS RESULT  Basic metabolic panel     Status: Abnormal   Collection Time: 06/29/16  2:56 AM  Result Value Ref Range   Sodium 137 135 - 145 mmol/L   Potassium 3.3 (L) 3.5 - 5.1 mmol/L   Chloride 106 101 - 111 mmol/L   CO2 25 22 - 32 mmol/L   Glucose, Bld 119 (H) 65 - 99 mg/dL   BUN 14 6 - 20 mg/dL   Creatinine, Ser 0.75 0.44 - 1.00 mg/dL   Calcium 9.3 8.9 - 10.3 mg/dL   GFR calc non Af Amer >60 >60 mL/min   GFR calc Af Amer >60 >60 mL/min    Comment: (NOTE) The eGFR has been calculated using the CKD EPI equation. This calculation has not been validated in all clinical situations. eGFR's persistently <60 mL/min signify possible Chronic Kidney Disease.    Anion gap 6 5 - 15  Glucose, capillary     Status: Abnormal   Collection Time: 06/29/16  9:08 PM  Result Value Ref Range   Glucose-Capillary 189 (H) 65 - 99 mg/dL  Basic metabolic panel     Status: Abnormal   Collection Time: 06/30/16  3:26 AM  Result Value Ref Range   Sodium 138 135 - 145 mmol/L   Potassium 4.4 3.5 - 5.1 mmol/L    Comment: DELTA CHECK NOTED   Chloride 109 101 - 111 mmol/L   CO2 23 22 - 32 mmol/L   Glucose, Bld 138 (H) 65 - 99 mg/dL   BUN 13 6 - 20 mg/dL   Creatinine, Ser 0.70 0.44 - 1.00 mg/dL   Calcium 9.4 8.9 - 10.3 mg/dL   GFR calc non Af Amer >60 >60 mL/min   GFR calc Af Amer >60 >60 mL/min    Comment: (NOTE) The eGFR has been calculated using the CKD EPI equation. This calculation has not been validated in all clinical situations. eGFR's persistently <60 mL/min signify possible Chronic Kidney Disease.    Anion gap 6 5 - 15     Imaging Results (Last 48 hours)  No results found.       Medical Problem List and Plan: 1.  Right hemiparesis and dysarthria secondary to Left pontine infarct/left  MCA bifurcation 4 mm saccular aneurysm. Continue aspirin therapy             -admit to inpatient rehab 2.  DVT Prophylaxis/Anticoagulation: Subcutaneous Lovenox. Monitor platelet counts and any signs of bleeding 3. Pain Management: Tylenol as needed 4. Mood: Provide emotional support 5. Neuropsych: This patient is capable of making decisions on her own behalf. 6. Skin/Wound Care: Routine skin checks 7. Fluids/Electrolytes/Nutrition: Routine I&O with follow-up chemistries during admission 8. Right 60-79% ICA stenosis. Follow-up outpatient vascular surgery Dr. Trula Slade 6 weeks 9. Hypertension. Norvasc 10 mg daily, HCTZ 25 mg daily. Monitor with increased mobility 10. Hyperlipidemia. Lipitor 11. UDS positive for cocaine with history also of alcohol abuse. Provide counseling 12. History hepatitis C 13. Liver cirrhosis with thrombocytopenia related to alcohol abuse. Follow-up chemistries  Post Admission Physician Evaluation: 1. Functional deficits secondary  to left pontine infarct. 2. Patient is admitted to receive collaborative, interdisciplinary care between the physiatrist, rehab nursing staff, and therapy team. 3. Patient's level of medical complexity and substantial therapy needs in context of that medical necessity cannot be provided at a lesser intensity of care such as a SNF. 4. Patient has experienced substantial functional loss from his/her baseline which was documented above under the "Functional History" and "Functional Status" headings.  Judging by the patient's diagnosis, physical exam, and functional history, the patient has potential for functional progress which will result in measurable gains while on inpatient rehab.  These gains will be of substantial and practical use upon discharge  in facilitating mobility and self-care at the household level. 5. Physiatrist will provide 24 hour management of medical needs as well as oversight of the therapy plan/treatment and provide guidance  as appropriate regarding the interaction of the two. 6. The Preadmission Screening has been reviewed and patient status is unchanged unless otherwise stated above. 7. 24 hour rehab nursing will assist with bladder management, bowel management, safety, skin/wound care, disease management, medication administration, pain management and patient education  and help integrate therapy concepts, techniques,education, etc. 8. PT will assess and treat for/with: Lower extremity strength, range of motion, stamina, balance, functional mobility, safety, adaptive techniques and equipment, NMR, ego support, stroke education, community reintegration.   Goals are: mod I. 9. OT will assess and treat for/with: ADL's, functional mobility, safety, upper extremity strength, adaptive techniques and equipment, NMR, community reintegration, stroke education.  Goals are: mod I. Therapy may proceed with showering this patient. 10. SLP will assess and treat for/with: speech, communication, education.  Goals are: mod I. 11. Case Management and Social Worker will assess and treat for psychological issues and discharge planning. 12. Team conference will be held weekly to assess progress toward goals and to determine barriers to discharge. 13. Patient will receive at least 3 hours of therapy per day at least 5 days per week. 14. ELOS: 7-8 days       15. Prognosis:  excellent     [name], MD, [facility] 07/02/2016  Cathlyn Parsons., PA-C 06/30/2016

## 2016-07-02 NOTE — Progress Notes (Signed)
All of patient's 10am meds given. Computer in room malfunctioned and patient moved to inpatient rehab before documentation could be completed. Deborah Wise

## 2016-07-02 NOTE — Discharge Summary (Signed)
[facility] Discharge Summary  Patient name: Deborah Wise Medical record number: [id number] Date of birth: 1950-08-13 Age: 66 y.o. Gender: female Date of Admission: 06/27/2016  Date of Discharge: 07/02/16 Admitting Physician: Zenia Resides, MD  Primary Care Provider: Pcp Not In System Consultants: Neurology, VVS, Neuroradiology  Indication for Hospitalization: Stroke  Discharge Diagnoses/Problem List:  Acute L pontine CVA HTN L MCA aneurysm Asymptomatic R carotid stenosis H/o diabetes Substance use disorder: EtOH, Cocaine H/o hepatitis C/liver cirrhosis  Disposition: CIR  Discharge Condition: Stable, improved  Discharge Exam:  General: lying in bed comfortably, no apparent distress. Cardiovascular: RRR, nl s1 & s2, no murmurs, no edema Respiratory: CTAB, normal effort on room air Abdomen: BS present & normal, soft, NTND, no guarding, no rebound, no mass Extremities: warm and well perfused Neuro: alert and oriented. R facial droop/decreased creasing. Strength 4/5 in R UE and LE. Sensation intact throughout. Dysarthria Skin: L groin dressing with dried serosanguineous fluid, intact. No erythema or hematoma.   Brief Hospital Course:  Sherrol Vicars a 66 y.o.female with PMH hypertension, diabetes, hepatitis C, liver cirrhosis, alcohol use disorder, thrombocytopenia who presented with right sided weakness.   Acute L pontine CVA in setting of cerebral aneurysm Patient presented with 3 days of unsteadiness, aphasia, facial droop, pronator drift, and multiple falls secondary to right hemiparesis. Was not a candidate for TPA given presented outside window. CT and CTA showed left paramedian pons acute infarct and 4 x 3 x 4 mm left MCA bifurcation aneurysm. MRI/MRA showed left paramedian pontine acute infarct with intracranial atherosclerosis. Neurology stroke team was consulted and recommended aspirin but no Plavix in the setting of cerebral  aneurysm. Patient had workup including echo that showed EF of 60-65% and grade 1 diastolic dysfunction, carotid Dopplers with right ICA stenosis at 60-80% and left ICA stenosis at 40%. Patient was medically optimized on statin given LDL 128, a1c 5.5, TSH 3.314 and her blood pressure was normalized quickly in the setting of cerebral aneurysm. Vascular surgery was consulted for asymptomatic right carotid stenosis at approximately 80%. Neuro-interventional radiology was consulted for cerebral aneurysm, patient had cerebral angiogram that showed 3 mm left MCA aneurysm and approximately 60% stenosis of right ICA. Vascular surgery recommended 6 month follow-up with repeat carotid duplex to discuss elective right carotid intervention given cerebral angiogram findings. Neuro-interventional radiology recommended follow-up as outpatient in 4-6 weeks. PT/OT recommended CIR placement. Patient was deemed medically stable and ready for discharge to CIR  Issues for Follow Up:   1. Follow up with Dr Estanislado Pandy neuro interventional radiology to discuss aneurysm treatment in 4-6 weeks 2. Follow-up in stroke clinic in 6 weeks 3. Follow-up with vascular surgery in 6 months with repeat carotid duplex for right carotid stenosis. 4. Patient counseled to quit cocaine and smoking 5. Metformin not started on discharge given A1c 5.5 and has been off metformin at home prior to admission 6. Continue amlodipine and HCTZ for good BP control in the setting of cerebral aneurysm  Significant Procedures: Cerebral angiogram  Significant Labs and Imaging:   Recent Labs Lab 06/28/16 0724 06/29/16 0256 07/01/16 0311  WBC 8.0 6.2 6.7  HGB 10.2* 10.0* 10.4*  HCT 31.9* 30.9* 32.1*  PLT 100* 95* 113*    Recent Labs Lab 06/27/16 2011 06/27/16 2017 06/28/16 0449 06/28/16 0724 06/29/16 0256 06/30/16 0326 07/01/16 0311  NA 137 142  --   --  137 138 136  K 3.4* 3.4*  --   --  3.3* 4.4  3.9  CL 106 107  --   --  106 109 105  CO2  18*  --   --   --  _0 GLUCOSE 114* 117*  --   --  119* 138* 140*  BUN 11 15  --   --  _1 CREATININE 0.78 1.00  --  0.67 0.75 0.70 0.65  CALCIUM 9.5  --   --   --  9.3 9.4 9.5  MG  --   --  1.7  --   --   --   --   PHOS  --   --  3.9  --   --   --   --   ALKPHOS 104  --   --   --   --   --   --   AST 56*  --   --   --   --   --   --   ALT 37  --   --   --   --   --   --   ALBUMIN 3.5  --   --   --   --   --   --    Lipid Panel     Component Value Date/Time   CHOL 190 06/28/2016 0449   TRIG 70 06/28/2016 0449   HDL 48 06/28/2016 0449   CHOLHDL 4.0 06/28/2016 0449   VLDL 14 06/28/2016 0449   LDLCALC 128 (H) 06/28/2016 0449   Hemoglobin a1c 5.5  TSH 3.314  S/P 4 Vessel cerebral arteriogram. RT CFA approach. Findings. 1.Approx 775m x 3.224mLt MCA inf division aneurysm. 2.Approc 2.75m68m 75mm45m PCOM anerysm.  3.Approx 60% stenosis of RT ICA prox , with severe stenosis of RT ECA prox 4.Approx 75 % stenosis of LT MCA m1 seg prox to the trifurcation  Echocardiogram Study Conclusions EF: 60% -   65% - Left ventricle: The cavity size was normal. There was moderate concentric hypertrophy. Systolic function was normal. The estimated ejection fraction was in the range of 60% to 65%. Wall motion was normal; there were no regional wall motion abnormalities. Doppler parameters are consistent with abnormal left ventricular relaxation (grade 1 diastolic dysfunction). Doppler parameters are consistent with elevated ventricular end-diastolic filling pressure.    Results/Tests Pending at Time of Discharge:   Discharge Medications:  Allergies as of 07/02/2016   No Known Allergies     Medication List    STOP taking these medications   metFORMIN 500 MG tablet Commonly known as:  GLUCOPHAGE   oxyCODONE 5 MG immediate release tablet Commonly known as:  ROXICODONE   Oxycodone HCl 10 MG Tabs     TAKE these medications   ACCU-CHEK AVIVA PLUS test strip Generic drug:   glucose blood USE TO CHECK BLOOD SUGAR TWICE A DAY   amLODipine 10 MG tablet Commonly known as:  NORVASC Take 1 tablet (10 mg total) by mouth daily.   aspirin 325 MG EC tablet Take 1 tablet (325 mg total) by mouth daily.   atorvastatin 80 MG tablet Commonly known as:  LIPITOR Take 1 tablet (80 mg total) by mouth daily at 6 PM.   glucose monitoring kit monitoring kit 1 each by Does not apply route 4 (four) times daily - after meals and at bedtime. 1 month Diabetic Testing Supplies for QAC-QHS accuchecks. Any brand OK   hydrochlorothiazide 25 MG tablet Commonly known as:  HYDRODIURIL Take 1 tablet (25 mg total) by mouth  daily.   thiamine 100 MG tablet Commonly known as:  VITAMIN B-1 Take 1 tablet (100 mg total) by mouth daily.       Discharge Instructions: Please refer to Patient Instructions section of EMR for full details.  Patient was counseled important signs and symptoms that should prompt return to medical care, changes in medications, dietary instructions, activity restrictions, and follow up appointments.   Follow-Up Appointments: Transfer to inpatient rehab  Bufford Lope, DO 07/02/2016, 9:36 AM PGY-1, [place]

## 2016-07-02 NOTE — Discharge Instructions (Signed)
Please continue a full dose aspirin 325mg  daily. For your blood pressure continue your home amlodipine 10mg  daily and also a new blood pressure medication hydrochlorothiazide 25mg  daily. It is very important to eat a low fat diet and continue taking cholesterol medication atorvastatin 80mg  daily. It is also very important to quit smoking and refrain from any recreational drugs like cocaine.  You should be follow up with neurology and neuro-radiology in about 6 weeks. You should also follow up with vascular surgery in about 6 months.

## 2016-07-02 NOTE — Progress Notes (Signed)
I met with pt and her fiance at bedside. I have an inpt rehab bed available today and both are in agreement to admission. I have notified RN CM and Attending service. I will make the arrangements to admit today. [phone]

## 2016-07-02 NOTE — Progress Notes (Signed)
Deborah Gong, RN Rehab Admission Coordinator Signed Physical Medicine and Rehabilitation  PMR Pre-admission Date of Service: 07/01/2016 10:13 AM  Related encounter: ED to Hosp-Admission (Current) from 06/27/2016 in [place]       [] Hide copied text PMR Admission Coordinator Pre-Admission Assessment  Patient: Deborah Wise is an 66 y.o., female MRN: [id number] DOB: February 23, 1951 Height: 5\' 5"  (165.1 cm) Weight: 74 kg (163 lb 2.3 oz)                                                                                                                                      Insurance Information HMO:     PPO:      PCP:      IPA:      80/20:      OTHER:  PRIMARY: Medicaid [place] Access      Policy#: [id number]      Subscriber: Self CM Name:       Phone#:      Fax#:  Pre-Cert#: Coverage Code: [place]      Employer: Not employed  Benefits:  Phone #: [phone]     Name: automated system     Eff. Date: eligible as of 06/30/16      Deduct:       Out of Pocket Max:       Life Max:  CIR:       SNF:  Outpatient:      Co-Pay:  Home Health:       Co-Pay:  DME:      Co-Pay:  Providers:   Medicaid Application Date:       Case Manager:  Disability Application Date:       Case Worker:   Emergency Contact Information        Contact Information    Name Relation Home Work Mobile   Hopper,Thomas Significant other   [phone]   Lattimore,Blanche Mother [phone]     Davy Pique [phone]       Current Medical History  Patient Admitting Diagnosis: Left pontine infarct with right hemiparesis and dysarthria  History of Present Illness: Deborah Wise a 66 y.o.right handed femalewith history of diabetes mellitus, hypertension, hepatitis C, alcohol abuse with liver cirrhosis and thrombocytopenia. Per chart review patient lives alone independently with occasionalprior to admission. One level home. She is acaregiver to both of  her parents who are wheelchair-boundwho also have hired caregivers.She has a boyfriend that can assist.Presented 06/27/2016 with right-sided weakness, slurred speechand fall. Urine drug screen positive for cocaine. Cranial CT scan showed focal hypodensity within the left paramedian pons, suspicious for evolving acute ischemic infarct. Patient did not receive TPA. CT angiogram head and neck negative for emergent large vessel occlusion. 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 MRI 06/28/2016 with left paramedian pontine acute infarct stable in comparison to prior  CT. No hemorrhage. Echocardiogram with ejection fraction [id number] grade 1 diastolic dysfunction. No regional wall motion abnormalities. Carotid Dopplers with right 60-79% ICA stenosis.. Diagnostic angiogram 01/24/2018per interventional radiology again identified right carotid stenosis measuring 60%. Plan is to follow-up outpatient vascular surgery Dr. Trula Slade in 6 weeks with repeat carotid duplex.Aspirin for CVA prophylaxis. Subcutaneous Lovenox for DVT prophylaxis. Tolerating a regular diet. Physical and occupational therapy evaluations completed with recommendations of physical medicine rehabilitation consult.Patient was admitted for comprehensive rehabilitation program   NIH Total: 5 Past Medical History      Past Medical History:  Diagnosis Date  . Arthritis   . Diabetes mellitus without complication ([place])   . Fibroid, uterine   . GERD (gastroesophageal reflux disease)   . Hepatitis    hep C  . Hypertension   . Thrombocytopenia ([place]) 12/25/2014  . Urinary incontinence    Patient noticed mild and is not currently a significant problem    Family History  family history includes Cancer in her father; Diabetes type II in her mother; Hypertension in her mother.  Prior Rehab/Hospitalizations:  Has the patient had major surgery during 100 days prior to admission? No  Current  Medications   Current Facility-Administered Medications:  .   stroke: mapping our early stages of recovery book, , Does not apply, Once, Mercy Riding, MD .  acetaminophen (TYLENOL) tablet 650 mg, 650 mg, Oral, Q4H PRN **OR** acetaminophen (TYLENOL) solution 650 mg, 650 mg, Per Tube, Q4H PRN **OR** acetaminophen (TYLENOL) suppository 650 mg, 650 mg, Rectal, Q4H PRN, Mercy Riding, MD .  amLODipine (NORVASC) tablet 10 mg, 10 mg, Oral, Daily, Elberta Leatherwood, MD, 10 mg at 07/01/16 0901 .  aspirin EC tablet 325 mg, 325 mg, Oral, Daily, Greta Doom, MD, 325 mg at 07/01/16 0900 .  atorvastatin (LIPITOR) tablet 80 mg, 80 mg, Oral, q1800, Bufford Lope, DO, 80 mg at 06/30/16 1729 .  enoxaparin (LOVENOX) injection 40 mg, 40 mg, Subcutaneous, Daily, Mercy Riding, MD, 40 mg at 07/01/16 0859 .  folic acid (FOLVITE) tablet 1 mg, 1 mg, Oral, Daily, Zenia Resides, MD, 1 mg at 07/01/16 0859 .  hydrochlorothiazide (HYDRODIURIL) tablet 25 mg, 25 mg, Oral, Daily, Elsia J Yoo, DO, 25 mg at 07/01/16 0900 .  LORazepam (ATIVAN) injection 2-3 mg, 2-3 mg, Intravenous, Q1H PRN, Mercy Riding, MD .  senna-docusate (Senokot-S) tablet 1 tablet, 1 tablet, Oral, QHS PRN, Mercy Riding, MD .  thiamine (VITAMIN B-1) tablet 100 mg, 100 mg, Oral, Daily, Zenia Resides, MD, 100 mg at 07/01/16 [id number]  Patients Current Diet: Diet Heart Room service appropriate? Yes; Fluid consistency: Thin  Precautions / Restrictions Precautions Precautions: Fall Restrictions Weight Bearing Restrictions: No   Has the patient had 2 or more falls or a fall with injury in the past year?No  Prior Activity Level Community (5-7x/wk): Prior to admission patient was fully independent and assisting her parents who are disabled.    Home Assistive Devices / Equipment Home Assistive Devices/Equipment: None Home Equipment: Cane - single point  Prior Device Use: Indicate devices/aids used by the patient prior to current illness,  exacerbation or injury? None of the above  Prior Functional Level Prior Function Level of Independence: Independent Comments: 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   Self Care: Did the patient need help bathing, dressing, using the toilet or eating? Independent  Indoor Mobility: Did the patient need assistance with walking from room to room (with  or without device)? Independent  Stairs: Did the patient need assistance with internal or external stairs (with or without device)? Independent  Functional Cognition: Did the patient need help planning regular tasks such as shopping or remembering to take medications? Independent  Current Functional Level Cognition  Arousal/Alertness: Awake/alert Overall Cognitive Status: Within Functional Limits for tasks assessed Orientation Level: Oriented X4 Safety/Judgement: Decreased awareness of safety, Decreased awareness of deficits General Comments: pt attempting to walk with lines attached to monitor Attention: Selective Selective Attention: Appears intact Memory: Appears intact Awareness: Appears intact Problem Solving: Appears intact    Extremity Assessment (includes Sensation/Coordination)  Upper Extremity Assessment: RUE deficits/detail RUE Deficits / Details: 3-/5 shoulder, 3+/5 elbow to hand RUE Coordination: decreased gross motor, decreased fine motor  Lower Extremity Assessment: Defer to PT evaluation RLE Deficits / Details: 4/5 gross motions  RLE Coordination: decreased fine motor, decreased gross motor    ADLs  Overall ADL's : Needs assistance/impaired Eating/Feeding: Modified independent, Sitting Grooming: Brushing hair, Oral care, Standing, Min guard Grooming Details (indicate cue type and reason): pt able to open containers and apply toothpaste to toothbrush Upper Body Bathing: Set up, Sitting Lower Body Bathing: Minimal assistance, Sit to/from stand Lower Body Bathing Details (indicate cue type  and reason): assist for balance Upper Body Dressing : Minimal assistance, Sitting Lower Body Dressing: Minimal assistance, Sit to/from stand Lower Body Dressing Details (indicate cue type and reason): able to don and doff socks, min assist for balance with standing Toilet Transfer: Minimal assistance, Ambulation Toileting- Clothing Manipulation and Hygiene: Minimal assistance, Sit to/from stand Functional mobility during ADLs: Minimal assistance    Mobility  General bed mobility comments: received OOB    Transfers  Overall transfer level: Needs assistance Equipment used: None Transfers: Sit to/from Stand Sit to Stand: Min guard General transfer comment: min guard assist for stability in elevation to standing    Ambulation / Gait / Stairs / Wheelchair Mobility  Ambulation/Gait Ambulation/Gait assistance: Museum/gallery curator (Feet): 90 Feet Assistive device: 1 person hand held assist Gait Pattern/deviations: Step-through pattern, Decreased stride length, Decreased stance time - right, Narrow base of support General Gait Details: patient with some instability during ambulation, ocassional right leg lag noted with increased instability. Multiple balance checks with min assist for stability Gait velocity: decreased Gait velocity interpretation: Below normal speed for age/gender    Posture / Balance Static Standing Balance Single Leg Stance - Right Leg:  (can not perform without UE support) Single Leg Stance - Left Leg: 10 Balance Overall balance assessment: Needs assistance, History of Falls Sitting-balance support: Feet supported Sitting balance-Leahy Scale: Good Standing balance support: During functional activity Single Leg Stance - Right Leg:  (can not perform without UE support) Single Leg Stance - Left Leg: 10    Special needs/care consideration BiPAP/CPAP: No CPM: No Continuous Drip IV: No Dialysis: No        Life Vest: No Oxygen: No Special Bed:  No Trach Size: No Wound Vac (area): No       Skin: WDL                               Bowel mgmt: 1/23 Continent  Bladder mgmt: Continent  Diabetic mgmt: HgbA1c- 5.4; PTA patient managed with oral medication   Previous Home Environment Living Arrangements: Alone Available Help at Discharge: Family, Available PRN/intermittently Type of Home: Apartment Home Layout: One level Home Access: Stairs to enter Entrance Stairs-Rails:  Can reach both Entrance Stairs-Number of Steps: flight Bathroom Shower/Tub: Chiropodist: Standard Home Care Services: No Additional Comments: reports staying at her parent's home a lot of the time  Discharge Living Setting Plans for Discharge Living Setting: Other (Comment) (plans to stay with her boyfriend upon discharge ) Type of Home at Discharge: House Discharge Home Layout: One level Discharge Home Access: Stairs to enter Entrance Stairs-Rails: None Entrance Stairs-Number of Steps: 2 Discharge Bathroom Shower/Tub: Tub/shower unit, Curtain Discharge Bathroom Toilet: Standard Discharge Bathroom Accessibility: Yes How Accessible: Accessible via walker Does the patient have any problems obtaining your medications?: Yes (Describe) (cost)  Social/Family/Support Systems Patient Roles: Caregiver, Partner Contact Information: Boyfriend: Gregary Signs  Anticipated Caregiver: Boyfriend  Anticipated Caregiver's Contact Information: [phone] Ability/Limitations of Caregiver: None  Caregiver Availability: 24/7 Discharge Plan Discussed with Primary Caregiver: No (discussed with patient given Mod I goals ) Is Caregiver In Agreement with Plan?:  (patient in agreement with plan ) Does Caregiver/Family have Issues with Lodging/Transportation while Pt is in Rehab?: No  Goals/Additional Needs Patient/Family Goal for Rehab: PT, OT, SLP Mod I  Expected length of stay: about 7 days  Cultural Considerations: None Dietary Needs: Heart Healty  Diet Restrictions  Equipment Needs: TBD Special Service Needs: None Additional Information: Patient reports the she also has 3 children  Pt/Family Agrees to Admission and willing to participate: Yes Program Orientation Provided & Reviewed with Pt/Caregiver Including Roles  & Responsibilities: Yes Additional Information Needs: None Information Needs to be Provided By: N/A  Decrease burden of Care through IP rehab admission: No  Possible need for SNF placement upon discharge: No  Patient Condition: This patient's medical and functional status has changed since the consult dated: 06/29/16 in which the Rehabilitation Physician determined and documented that the patient's condition is appropriate for intensive rehabilitative care in an inpatient rehabilitation facility. See "History of Present Illness" (above) for medical update. Functional changes are: overall min assist. Patient's medical and functional status update has been discussed with the Rehabilitation physician and patient remains appropriate for inpatient rehabilitation. Will admit to inpatient rehab today.  Preadmission Screen Completed By:  Gunnar Fusi, 07/01/2016 10:13 AM ______________________________________________________________________   Discussed status with Dr.  Naaman Plummer on 07/02/2016 at  1053 and received telephone approval for admission today.  Admission Coordinator:  Danne Baxter, time [id number] Date 07/02/2016        Cosigned by: [name], MD at 07/02/2016 11:08 AM  Revision History

## 2016-07-02 NOTE — Progress Notes (Signed)
Family Medicine Teaching Service Daily Progress Note Intern Pager: [phone]  Patient name: Deborah Wise Medical record number: [id number] Date of birth: 05-02-1951 Age: 66 y.o. Gender: female  Primary Care Provider: Pcp Not In System Consultants: Neurology Code Status: Full  Pt Overview and Major Events to Date:  1/21 Admitted for stroke  Assessment and Plan: Deborah Wise is a 66 y.o. female presenting with right sided weakness. PMH is significant for hypertension, diabetes, hepatitis C, liver cirrhosis, alcohol use disorder and thrombocytopenia  Acute CVA, L pontine infarct, stable. Presented with aphasia, R facial droop, pronator drift and multiple falls 2/2 R hemiparesis over past 3 days, was not a candidate for tPA given presented outside window. CT and CTA showed left paramedian pons acute infarct. MRI/MRA showed L paramedian pontine acute infarction with intracranial atherosclerosis with multiple areas of mild stenosis most pronounced in proximal PCA and left proximal A1 segments.  -Neurology stroke team signed off, follow up as outpatient in 6 weeks             -DC plavix due to cerebral aneurysm. Continue aspirin 325 mg daily             -Echo EF 60-65%, G1DD  -carotid dopplers prelim: Right 60-79% ICA stenosis. Left 1-39% ICA stenosis. -Risk stratification labs: LDL 128, TSH 3.314, A1c 5.5 -Neurovascular check q4hrs -Cardiac monitor -Normalizing BP in setting of cerebral aneurysm -Atorvastatin 80mg  qd -Fall precautions  -PT/OT recommended CIR, and per PM&R is appropriate, now awaiting CIR bed vs SNF placement  Hypertension: BP 136/71 this am Not compliant with home amlodipine. Has been on HCTZ in the past. Per stroke team, start normalizing BP in setting of acute CVA with cerebral aneurysm. -Normalizing BP, continue amlodipine 10mg  qd and HCTZ 25mg  qd  L MCA aneurysm MRA showed Left MCA bifurcation 4 mm saccular aneurysm. Cerebral angiogram performed by  neuroradiology showing L MCA inf division aneurysm, R PCOM aneurysm,  R ICA 60% stenosis with severe stenosis of R ECA, L MCA stenosis  - Neuro interventional radiology follow up as outpatient in 2 months, signed off.  Asymptomatic R carotid stenosis  CTA also showed atheromatous stenoses about the carotid bifurcations bilaterally, 80% on R and 40% on L. R ICA with 80% stenosis on carotid dopplers however on cerebral angiogram sis approximately 60% this appears unrelated to CVA since infarct on L side. - Per [facility]: follow up as outpatient in 6 months with repeat carotid duplex to discuss elective R carotid intervention, signed off.  H/o diabetes: Last A1c 4.9 in 2015, repeat a1c 5.5. Not taking home metformin -SSI -Will not restart metformin on DC  Substance use disorder. Alcohol and cocaine. Reports drinking as much alcohol as she can get, last drink was afternoon of admission. Alcohol level 134 in ED. Exam with ataxia. UDS positive for cocaine -CIWA protocol  Hepatitis C/liver cirrhosis: last viral load 149K in 12/2014. Not immune to hep B. No sign of ascites. She was followed Zamor Lynn Ito, MD at [facility]. She was at week 5 of 12 on Harvoni as of 04/20/2016. Per hepatology had cleared viral load on 06/08/16 lab work.  FEN/GI:  -Heart healthy. -Saline lock  Prophylaxis: lovenox  Disposition: medically stable. Awaiting CIR vs SNF placement  Subjective:  Feels well this morning. States L groin has had some drainage. No other concerns today.  Objective: Temp:  [98 F (36.7 C)-99.1 F (37.3 C)] 98.2 F (36.8 C) (01/26 0537) Pulse Rate:  [73-85] 73 (01/26 0537)  Resp:  [16-23] 18 (01/26 0537) BP: (132-171)/(57-75) 136/61 (01/26 0537) SpO2:  [94 %-100 %] 99 % (01/26 0537) Physical Exam: General: lying in bed comfortably, no apparent distress. Cardiovascular: RRR, nl s1 & s2, no murmurs, no edema Respiratory: CTAB, normal effort on room air Abdomen: BS present &  normal, soft, NTND, no guarding, no rebound, no mass Extremities: warm and well perfused Neuro: alert and oriented. R facial droop/decreased creasing. Strength 4/5 in R UE and LE. Sensation intact throughout. Dysarthria Skin: L groin dressing with dried serosanguineous fluid, intact. No erythema or hematoma.  Laboratory:  Recent Labs Lab 06/28/16 0724 06/29/16 0256 07/01/16 0311  WBC 8.0 6.2 6.7  HGB 10.2* 10.0* 10.4*  HCT 31.9* 30.9* 32.1*  PLT 100* 95* 113*    Recent Labs Lab 06/27/16 2011  06/29/16 0256 06/30/16 0326 07/01/16 0311  NA 137  < > 137 138 136  K 3.4*  < > 3.3* 4.4 3.9  CL 106  < > 106 109 105  CO2 18*  --  25 23 23   BUN 11  < > 14 13 12   CREATININE 0.78  < > 0.75 0.70 0.65  CALCIUM 9.5  --  9.3 9.4 9.5  PROT 7.9  --   --   --   --   BILITOT 0.8  --   --   --   --   ALKPHOS 104  --   --   --   --   ALT 37  --   --   --   --   AST 56*  --   --   --   --   GLUCOSE 114*  < > 119* 138* 140*  < > = values in this interval not displayed.  Lipid Panel     Component Value Date/Time   CHOL 190 06/28/2016 0449   TRIG 70 06/28/2016 0449   HDL 48 06/28/2016 0449   CHOLHDL 4.0 06/28/2016 0449   VLDL 14 06/28/2016 0449   LDLCALC 128 (H) 06/28/2016 0449   TSH 3.314   Imaging/Diagnostic Tests: S/P 4 Vessel cerebral arteriogram. RT CFA approach. Findings. 1.Approx 78mm x 3.51mm Lt MCA inf division aneurysm. 2.Approc 2.97mm x 22mm RT PCOM anerysm.  3.Approx 60% stenosis of RT ICA prox , with severe stenosis of RT ECA prox 4.Approx 75 % stenosis of LT MCA m1 seg prox to the trifurcation   Bufford Lope, DO 07/02/2016, 6:54 AM PGY-1, Lupus Intern pager: [phone], text pages welcome

## 2016-07-02 NOTE — Progress Notes (Signed)
Occupational Therapy Treatment Patient Details Name: Deborah Wise MRN: [id number] DOB: 05-01-1951 Today's Date: 07/02/2016    History of present illness 66 y.o. female presenting with right sided weakness. PMH is significant for hypertension, diabetes, hepatitis C, liver cirrhosis, alcohol use disorder and thrombocytopenia. Imaging revealed Acute CVA, L pontine infarct.   OT comments  Pt making good, steady progress towards OT goals - continue plan of care for now. Therapist educated pt and significant other on OT role, OT goals, OT purpose. See below under ADL and exercises for more information on skilled intervention this session. Pt is right handed, encouraged her to incorporate use of right hand into all activities and asked significant other to encourage use of right hand as well.    Follow Up Recommendations  CIR;Supervision/Assistance - 24 hour    Equipment Recommendations   (defer to next venue)    Recommendations for Other Services  none at this time   Precautions / Restrictions Precautions Precautions: Fall Restrictions Weight Bearing Restrictions: No    Mobility Bed Mobility Overal bed mobility: Needs Assistance Bed Mobility: Supine to Sit;Sit to Supine     Supine to sit: Min guard Sit to supine: Min guard   General bed mobility comments: guard for safety. Cues for technique and sequencing.   Transfers - Per PT Overall transfer level: Needs assistance Equipment used: Rolling walker (2 wheeled) Transfers: Sit to/from Stand Sit to Stand: Min guard         General transfer comment: repeated cues needed for safety and hand placement.     Balance Overall balance assessment: Needs assistance Sitting-balance support: No upper extremity supported Sitting balance-Leahy Scale: Good     Standing balance support: During functional activity Standing balance-Leahy Scale: Fair Standing balance comment: able to stand static    ADL Overall ADL's : Needs  assistance/impaired Eating/Feeding: Modified independent;Sitting Eating/Feeding Details (indicate cue type and reason): using left hand Lower Body Dressing: Min guard;Sitting/lateral leans Lower Body Dressing Details (indicate cue type and reason): for socks General ADL Comments: cueing to use right hand during ADL. Administerred red built up foam handle for self-feeding using right hand (to increase independence) and to increase independence with brushing teeth using right hand.            Cognition   Behavior During Therapy: WFL for tasks assessed/performed Overall Cognitive Status: Impaired/Different from baseline Area of Impairment: Safety/judgement          Safety/Judgement: Decreased awareness of safety;Decreased awareness of deficits     General Comments: Pt not recognizing balance deficits or safety considerations.       Exercises General Exercises - Upper Extremity Shoulder Flexion: AAROM;15 reps;Supine;Right Shoulder Extension: AAROM;15 reps;Supine;Right Shoulder ABduction: AAROM;15 reps;Supine;Right Shoulder ADduction: AAROM;15 reps;Supine;Right Elbow Flexion: AROM;15 reps;Supine;Right Elbow Extension: AROM;15 reps;Supine;Right Wrist Flexion: AAROM;15 reps;Supine;Right Wrist Extension: AAROM;Right Digit Composite Flexion: AROM;Right;15 reps;Supine Composite Extension: AROM;Right;Supine;15 reps Hand Exercises Forearm Supination: AAROM;Right;15 reps;Supine Forearm Pronation: AAROM;Right;15 reps;Supine Opposition: AROM;Right;15 reps;Supine Other Exercises Other Exercises: sit<>stand X2 with static standing balance           Pertinent Vitals/ Pain       Pain Assessment: No/denies pain  Frequency  Min 3X/week    Progress Toward Goals  OT Goals(current goals can now be found in the care plan section)  Progress towards OT goals: Progressing toward goals  Acute Rehab OT Goals Patient Stated Goal: go to rehab OT Goal Formulation: With patient/family Time  For Goal Achievement: 07/13/16 Potential to Achieve Goals: Good  Plan  Discharge plan remains appropriate       End of Session   Activity Tolerance Patient tolerated treatment well   Patient Left with call bell/phone within reach;with family/visitor present;in bed;with bed alarm set     Time: [id number] OT Time Calculation (min): 25 min  Charges: OT General Charges $OT Visit: 1 Procedure OT Treatments $Self Care/Home Management : 8-22 mins $Neuromuscular Re-education: 8-22 mins  Chrys Racer , MS, OTR/L, CLT  07/02/2016, 1:43 PM

## 2016-07-02 NOTE — Progress Notes (Signed)
Physical Therapy Treatment Patient Details Name: Deborah Wise MRN: [id number] DOB: 1950/10/01 Today's Date: 07/02/2016    History of Present Illness 66 y.o. female presenting with right sided weakness. PMH is significant for hypertension, diabetes, hepatitis C, liver cirrhosis, alcohol use disorder and thrombocytopenia. Imaging revealed Acute CVA, L pontine infarct.    PT Comments    Pt making gradual progress with PT intervention. Able to ambulate 110 ft with intermittent min assist for stability. Pt not recognizing deficits with balance, reporting that she feels like she is stable when walking. Based upon current mobility, recommend CIR for further rehabilitation.   Follow Up Recommendations  CIR     Equipment Recommendations   (to be addressed at next venue)    Recommendations for Other Services       Precautions / Restrictions Precautions Precautions: Fall Restrictions Weight Bearing Restrictions: No    Mobility  Bed Mobility Overal bed mobility: Needs Assistance Bed Mobility: Supine to Sit     Supine to sit: Min guard     General bed mobility comments: guard for safety  Transfers Overall transfer level: Needs assistance Equipment used: Rolling walker (2 wheeled) Transfers: Sit to/from Stand Sit to Stand: Min guard         General transfer comment: repeated cues needed for safety and hand placement.   Ambulation/Gait Ambulation/Gait assistance: Min assist Ambulation Distance (Feet): 110 Feet Assistive device: Rolling walker (2 wheeled) Gait Pattern/deviations: Step-through pattern;Decreased step length - right;Decreased step length - left;Trunk flexed Gait velocity: decreased   General Gait Details: Pt with narrow base of support and assist provided as needed for stability.    Stairs            Wheelchair Mobility    Modified Rankin (Stroke Patients Only) Modified Rankin (Stroke Patients Only) Pre-Morbid Rankin Score: No  symptoms Modified Rankin: Moderately severe disability     Balance Overall balance assessment: Needs assistance Sitting-balance support: No upper extremity supported Sitting balance-Leahy Scale: Good     Standing balance support: During functional activity Standing balance-Leahy Scale: Fair Standing balance comment: able to stand static                    Cognition Arousal/Alertness: Awake/alert Behavior During Therapy: Impulsive Overall Cognitive Status: Impaired/Different from baseline Area of Impairment: Safety/judgement         Safety/Judgement: Decreased awareness of safety;Decreased awareness of deficits     General Comments: Pt not recognizing balance deficits or safety considerations.     Exercises Other Exercises Other Exercises: sit<>stand X2 with static standing balance    General Comments        Pertinent Vitals/Pain Pain Assessment: No/denies pain    Home Living                      Prior Function            PT Goals (current goals can now be found in the care plan section) Acute Rehab PT Goals Patient Stated Goal: to get back to normal PT Goal Formulation: With patient Time For Goal Achievement: 07/13/16 Potential to Achieve Goals: Good Progress towards PT goals: Progressing toward goals    Frequency    Min 4X/week      PT Plan Current plan remains appropriate    Co-evaluation             End of Session Equipment Utilized During Treatment: Gait belt Activity Tolerance: Patient tolerated treatment well Patient left: in chair;with  call bell/phone within reach;with chair alarm set;with family/visitor present     Time: [id number] PT Time Calculation (min) (ACUTE ONLY): 15 min  Charges:  $Gait Training: 8-22 mins                    G Codes:      Cassell Clement, PT, CSCS Pager [phone] Office [phone]  07/02/2016, 1:01 PM

## 2016-07-03 ENCOUNTER — Inpatient Hospital Stay (HOSPITAL_COMMUNITY): Payer: Medicaid Other | Admitting: Speech Pathology

## 2016-07-03 ENCOUNTER — Inpatient Hospital Stay (HOSPITAL_COMMUNITY): Payer: Medicaid Other | Admitting: Occupational Therapy

## 2016-07-03 ENCOUNTER — Inpatient Hospital Stay (HOSPITAL_COMMUNITY): Payer: Medicaid Other | Admitting: Physical Therapy

## 2016-07-03 DIAGNOSIS — K7030 Alcoholic cirrhosis of liver without ascites: Secondary | ICD-10-CM

## 2016-07-03 DIAGNOSIS — I10 Essential (primary) hypertension: Secondary | ICD-10-CM

## 2016-07-03 DIAGNOSIS — I6350 Cerebral infarction due to unspecified occlusion or stenosis of unspecified cerebral artery: Secondary | ICD-10-CM

## 2016-07-03 NOTE — Evaluation (Signed)
Occupational Therapy Assessment and Plan  Patient Details  Name: Deborah Wise MRN: [id number] Date of Birth: 1951-02-14  OT Diagnosis: cognitive deficits, hemiplegia affecting dominant side and muscle weakness (generalized) Rehab Potential: Rehab Potential (ACUTE ONLY): Excellent ELOS: 7-10 days     Today's Date: 07/03/2016 OT Individual Time: 9449-6759 OT Individual Time Calculation (min): 74 min     Problem List:  Patient Active Problem List   Diagnosis Date Noted  . 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[place]) 07/02/2016  . Aneurysm of middle cerebral artery   . Cerebrovascular accident (CVA) due to thrombosis of left vertebral artery ([place]) 06/28/2016  . Cocaine abuse   . Stroke (cerebrum) ([place]) 06/27/2016  . Microcytic anemia 12/27/2014  . Thrombocytopenia ([place]) 12/25/2014  . Hypoglycemia 07/21/2013  . Diabetes mellitus ([place]) 07/21/2013  . Essential hypertension 07/21/2013  . Liver disease 07/21/2013  . Alcohol abuse 07/21/2013    Past Medical History:  Past Medical History:  Diagnosis Date  . Arthritis   . Diabetes mellitus without complication ([place])   . Fibroid, uterine   . GERD (gastroesophageal reflux disease)   . Hepatitis    hep C  . Hypertension   . Thrombocytopenia ([place]) 12/25/2014  . Urinary incontinence    Patient noticed mild and is not currently a significant problem   Past Surgical History:  Past Surgical History:  Procedure Laterality Date  . ANKLE SURGERY    . broken ankle    . MULTIPLE EXTRACTIONS WITH ALVEOLOPLASTY  10/25/2011   Procedure: MULTIPLE EXTRACION WITH ALVEOLOPLASTY;  Surgeon: Gae Bon, DDS;  Location: [facility];  Service: Oral Surgery;  Laterality: Bilateral;  Extract teeth numbers one, two, six, seven, eight, nine, ten, eleven, twelve, fifteen, sixteen, eighteen, nineteen, twenty-three, twenty-four, twenty-five, twenty-seven, thirty-two and alveoplasty.    Assessment & Plan Clinical Impression: Deborah Wise a 66  y.o.right handed femalewith history of diabetes mellitus, hypertension, hepatitis C, alcohol abuse with liver cirrhosis and thrombocytopenia. Per chart review patient lives alone independently with occasionalprior to admission. One level home. She is acaregiver to both of her parents who are wheelchair-boundwho also have hired caregivers.She has a boyfriend that can assist.Presented 06/27/2016 with right-sided weakness, slurred speechand fall. Urine drug screen positive for cocaine. Cranial CT scan showed focal hypodensity within the left paramedian pons, suspicious for evolving acute ischemic infarct. Patient did not receive TPA. CT angiogram head and neck negative for emergent large vessel occlusion. 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 MRI 06/28/2016 with left paramedian pontine acute infarct stable in comparison to prior CT. No hemorrhage. Echocardiogram with ejection fraction 16% grade 1 diastolic dysfunction. No regional wall motion abnormalities. Carotid Dopplers with right 60-79% ICA stenosis.. Diagnostic angiogram 01/24/2018per interventional radiology again identified right carotid stenosis measuring 60%. Plan is to follow-up outpatient vascular surgery Dr. Trula Slade in 6 weeks with repeat carotid duplex.Aspirin for CVA prophylaxis. Subcutaneous Lovenox for DVT prophylaxis. Tolerating a regular diet. Physical and occupational therapy evaluations completed with recommendations of physical medicine rehabilitation consult.Patient was admitted for comprehensive rehabilitation program  Patient currently requires min with basic self-care skills secondary to muscle weakness, decreased cardiorespiratoy endurance, unbalanced muscle activation and decreased coordination and hemiplegia.  Prior to hospitalization, patient could complete BADLs with independent .  Patient will benefit from skilled intervention to increase independence with basic self-care skills  prior to discharge home with care partner.  Anticipate patient will require intermittent supervision and follow up outpatient.  OT - End of  Session Endurance Deficit: Yes OT Assessment Rehab Potential (ACUTE ONLY): Excellent OT Patient demonstrates impairments in the following area(s): Balance;Safety;Endurance;Motor OT Basic ADL's Functional Problem(s): Grooming;Bathing;Dressing;Toileting OT Advanced ADL's Functional Problem(s): Simple Meal Preparation OT Transfers Functional Problem(s): Toilet;Tub/Shower OT Additional Impairment(s): Fuctional Use of Upper Extremity OT Plan OT Intensity: Minimum of 1-2 x/day, 45 to 90 minutes OT Frequency: 5 out of 7 days OT Duration/Estimated Length of Stay: 7-10 days OT Treatment/Interventions: Discharge planning;Self Care/advanced ADL retraining;Therapeutic Activities;UE/LE Coordination activities;Cognitive remediation/compensation;Functional mobility training;Patient/family education;Therapeutic Exercise;DME/adaptive equipment instruction;Neuromuscular re-education;Psychosocial support;UE/LE Strength taining/ROM OT Self Feeding Anticipated Outcome(s): N/A OT Basic Self-Care Anticipated Outcome(s): Supervision-Mod I  OT Toileting Anticipated Outcome(s): Mod I  OT Bathroom Transfers Anticipated Outcome(s): Supervision-Mod I  OT Recommendation Patient destination: Home Follow Up Recommendations: Outpatient OT Equipment Recommended: Tub/shower bench;To be determined Skilled Therapeutic Intervention Skilled OT session completed with focus on initial evaluation, education on OT roles/POC, and establishment of client-centered goals. Pt was lying in bed with boyfriend present at time of arrival. Agreeable to shower with RN consent to remove arm bandages to wash. Pt ambulated to shower with HHA, cues for minimizing furniture walking. Bathing completed with overall Min guard for standing safety. Pt able to integrate R UE at diminished level while bathing,  utilizing ankle over knee strategies for reaching bilateral LEs. Afterwards pt completed toilet transfer and toileting with min guard. Dressing was then completed in chair at sink. Min guard for standing balance and ambulating in room to retrieve clothing items. Tub bench transfer completed with min guard with pt ambulating from room to tub room while pushing w/c. Pt will need tub bench for home (boyfriend's residence). Pt then returned to room in manner as written above. Two HEPs provided and reviewed for R UE NMR with pt demonstrating good carryover of education. Cues for ST memory recall during tx. Extensive education completed regarding call bell and notifying nursing for needs. Pt verbalized understanding. She was left with boyfriend and all needs within reach at time of departure.   OT Evaluation Precautions/Restrictions  Precautions Precautions: Fall Restrictions Weight Bearing Restrictions: No General Chart Reviewed: Yes Family/Caregiver Present: Yes Vital Signs Therapy Vitals Temp: 98.4 F (36.9 C) Temp Source: Oral Pulse Rate: 79 Resp: 18 BP: 138/61 Patient Position (if appropriate): Lying Oxygen Therapy SpO2: 100 % O2 Device: Not Delivered Pain No c/o pain during session    Home Living/Prior Functioning Home Living Available Help at Discharge: Other (Comment) (Boyfriend) Type of Home: House Home Access: Stairs to enter [employer] of Steps: 1 Entrance Stairs-Rails: Can reach both Home Layout: One level Bathroom Shower/Tub: Optometrist: Yes  Lives With: Son IADL History Homemaking Responsibilities: Yes Meal Prep Responsibility: Therapist, occupational Responsibility: Primary Cleaning Responsibility: Primary Shopping Responsibility: Primary Education: finished 9th grade Occupation: Unemployed Leisure and Hobbies: Reading Prior Function Level of Independence: Independent with basic ADLs, Independent with  homemaking with ambulation, Independent with transfers  Able to Take Stairs?: Yes Driving: Yes Vocation: Retired Comments: 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  ADL ADL ADL Comments: Please see functional navigator for ADL status Vision/Perception  Vision- History Baseline Vision/History: No visual deficits Patient Visual Report: No change from baseline Vision- Assessment Vision Assessment?: No apparent visual deficits;Yes Perception Comments: Perceptual skills WFL during BADL completion   Cognition Overall Cognitive Status: Within Functional Limits for tasks assessed Arousal/Alertness: Awake/alert Orientation Level: Person;Place;Situation Person: Oriented Place: Oriented Situation: Oriented Year: 2018 Month: January Day of Week: Correct Memory: Appears  intact Immediate Memory Recall: Sock;Blue;Bed Memory Recall: Sock;Blue;Bed Memory Recall Sock: Without Cue Memory Recall Blue: Without Cue Memory Recall Bed: Without Cue Attention: Selective Selective Attention: Appears intact Awareness: Appears intact Problem Solving: Appears intact Safety/Judgment: Appears intact Sensation Sensation Light Touch: Appears Intact Hot/Cold: Appears Intact Proprioception: Appears Intact Coordination Gross Motor Movements are Fluid and Coordinated: No Fine Motor Movements are Fluid and Coordinated: No Coordination and Movement Description: decreased R UE strength/coordination  Finger Nose Finger Test: No deficits  Motor  Motor Motor: Hemiplegia Mobility  Transfers Transfers: Sit to Stand;Stand to Sit Sit to Stand: 4: Min guard  Trunk/Postural Assessment  Cervical Assessment Cervical Assessment: Within Functional Limits Thoracic Assessment Thoracic Assessment: Within Functional Limits Lumbar Assessment Lumbar Assessment: Within Functional Limits Postural Control Postural Control: Within Functional Limits  Balance Static Sitting Balance Static Sitting -  Balance Support: No upper extremity supported Static Sitting - Level of Assistance: 6: Modified independent (Device/Increase time) Dynamic Sitting Balance Dynamic Sitting - Balance Support: No upper extremity supported Dynamic Sitting - Level of Assistance: 6: Modified independent (Device/Increase time) Static Standing Balance Static Standing - Balance Support: No upper extremity supported Static Standing - Level of Assistance: 4: Min assist Dynamic Standing Balance Dynamic Standing - Balance Support: No upper extremity supported Dynamic Standing - Level of Assistance: 4: Min assist Dynamic Standing - Balance Activities:  (during ADLs) Extremity/Trunk Assessment RUE Assessment RUE Assessment: Exceptions to WFL (3/5 MMT) LUE Assessment LUE Assessment: Exceptions to [facility] (4-/5 MMT)   See Function Navigator for Current Functional Status.   Refer to Care Plan for Long Term Goals  Recommendations for other services: None    Discharge Criteria: Patient will be discharged from OT if patient refuses treatment 3 consecutive times without medical reason, if treatment goals not met, if there is a change in medical status, if patient makes no progress towards goals or if patient is discharged from hospital.  The above assessment, treatment plan, treatment alternatives and goals were discussed and mutually agreed upon: by patient  Skeet Simmer 07/03/2016, 6:22 PM

## 2016-07-03 NOTE — Evaluation (Signed)
Physical Therapy Assessment and Plan  Patient Details  Name: Deborah Wise MRN: [id number] Date of Birth: 11-07-1950  PT Diagnosis: Abnormality of gait, Coordination disorder, Hemiplegia non-dominant and Muscle weakness Rehab Potential: Excellent ELOS: 7-10   Today's Date: 07/03/2016 PT Individual Time: 1300-1400 PT Individual Time Calculation (min): 60 min    Problem List:  Patient Active Problem List   Diagnosis Date Noted  . 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[place]) 07/02/2016  . Aneurysm of middle cerebral artery   . Cerebrovascular accident (CVA) due to thrombosis of left vertebral artery ([place]) 06/28/2016  . Cocaine abuse   . Stroke (cerebrum) ([place]) 06/27/2016  . Microcytic anemia 12/27/2014  . Thrombocytopenia ([place]) 12/25/2014  . Hypoglycemia 07/21/2013  . Diabetes mellitus ([place]) 07/21/2013  . Essential hypertension 07/21/2013  . Liver disease 07/21/2013  . Alcohol abuse 07/21/2013    Past Medical History:  Past Medical History:  Diagnosis Date  . Arthritis   . Diabetes mellitus without complication ([place])   . Fibroid, uterine   . GERD (gastroesophageal reflux disease)   . Hepatitis    hep C  . Hypertension   . Thrombocytopenia ([place]) 12/25/2014  . Urinary incontinence    Patient noticed mild and is not currently a significant problem   Past Surgical History:  Past Surgical History:  Procedure Laterality Date  . ANKLE SURGERY    . broken ankle    . MULTIPLE EXTRACTIONS WITH ALVEOLOPLASTY  10/25/2011   Procedure: MULTIPLE EXTRACION WITH ALVEOLOPLASTY;  Surgeon: Gae Bon, DDS;  Location: [facility];  Service: Oral Surgery;  Laterality: Bilateral;  Extract teeth numbers one, two, six, seven, eight, nine, ten, eleven, twelve, fifteen, sixteen, eighteen, nineteen, twenty-three, twenty-four, twenty-five, twenty-seven, thirty-two and alveoplasty.    Assessment & Plan Clinical Impression: Patient is a 66 y.o.right handed femalewith history of diabetes mellitus,  hypertension, hepatitis C, alcohol abuse with liver cirrhosis and thrombocytopenia. Per chart review patient lives alone independently with occasionalprior to admission. One level home. She is acaregiver to both of her parents who are wheelchair-boundwho also have hired caregivers.She has a boyfriend that can assist.Presented 06/27/2016 with right-sided weakness, slurred speechand fall. Urine drug screen positive for cocaine. Cranial CT scan showed focal hypodensity within the left paramedian pons, suspicious for evolving acute ischemic infarct. Patient did not receive TPA. CT angiogram head and neck negative for emergent large vessel occlusion. 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 MRI 06/28/2016 with left paramedian pontine acute infarct stable in comparison to prior CT. No hemorrhage. Echocardiogram with ejection fraction 93% grade 1 diastolic dysfunction. No regional wall motion abnormalities. Carotid Dopplers with right 60-79% ICA stenosis.. Diagnostic angiogram 01/24/2018per interventional radiology again identified right carotid stenosis measuring 60%. Plan is to follow-up outpatient vascular surgery Dr. Trula Slade in 6 weeks with repeat carotid duplex.Aspirin for CVA prophylaxis.  Patient transferred to CIR on 07/02/2016 .   Patient currently requires min with mobility secondary to muscle weakness, unbalanced muscle activation and decreased coordination and decreased standing balance, hemiplegia and decreased balance strategies.  Prior to hospitalization, patient was independent  with mobility and lived with Son in a House home.  Home access is 1 (step into entrance)Stairs to enter.  Patient will benefit from skilled PT intervention to maximize safe functional mobility, minimize fall risk and decrease caregiver burden for planned discharge home with intermittent assist.  Anticipate patient will benefit from follow up [facility] at discharge.  PT - End of  Session Activity  Tolerance: Tolerates 30+ min activity with multiple rests Endurance Deficit: Yes PT Assessment Rehab Potential (ACUTE/IP ONLY): Excellent Barriers to Discharge: Inaccessible home environment PT Patient demonstrates impairments in the following area(s): Balance;Motor;Endurance;Safety;Sensory PT Transfers Functional Problem(s): Bed Mobility;Bed to Chair;Car;Furniture;Floor PT Locomotion Functional Problem(s): Ambulation;Wheelchair Mobility;Stairs PT Plan PT Intensity: Minimum of 1-2 x/day ,45 to 90 minutes PT Frequency: 5 out of 7 days PT Duration Estimated Length of Stay: 7-10 PT Treatment/Interventions: Ambulation/gait training;Balance/vestibular training;Cognitive remediation/compensation;Community reintegration;Discharge planning;Disease management/prevention;DME/adaptive equipment instruction;Functional mobility training;Neuromuscular re-education;Pain management;Patient/family education;Psychosocial support;Skin care/wound management;Stair training;Therapeutic Activities;Therapeutic Exercise;UE/LE Strength taining/ROM;UE/LE Coordination activities;Visual/perceptual remediation/compensation;Wheelchair propulsion/positioning PT Transfers Anticipated Outcome(s): Mod I  PT Locomotion Anticipated Outcome(s): Mod I with LRAD  PT Recommendation Recommendations for Other Services: Therapeutic Recreation consult Therapeutic Recreation Interventions: Kitchen group;Pet therapy;Outing/community reintergration Follow Up Recommendations: Home health PT Patient destination: Home Equipment Recommended: To be determined     Skilled Therapeutic Intervention PT instructed patient in Evaluation and initiated treatment intervention; see below for results. Patient educated in stairs, gait, transfers (including WC and Car), bed mobility, and WC mobility. Overall patient required min to close supervision assist form PT with occasional cues for safety. PT also instructed patient in berg balance  test; see below for results.  Patient demonstrates increased fall risk as noted by score of  43 /56 on Berg Balance Scale.  (<36= high risk for falls, close to 100%; 37-45 significant >80%; 46-51 moderate >50%; 52-55 lower >25%) Pt educated in POC, discharge recommendations, estimated length of stay, and goals of rehab.  Patient returned to room and left supine in bed with call bell in reach and all needs met.     PT Evaluation Precautions/Restrictions Precautions Precautions: Fall Restrictions Weight Bearing Restrictions: No General   Vital Signs  Pain Pain Assessment Pain Assessment: No/denies pain Pain Score: 0-No pain Home Living/Prior Functioning Home Living Available Help at Discharge: Family Type of Home: House Home Access: Stairs to enter [employer] of Steps: 1 (step into entrance) Entrance Stairs-Rails: Can reach both Home Layout: One level Bathroom Shower/Tub: Chiropodist: Standard Bathroom Accessibility: Yes  Lives With: Son Prior Function Level of Independence: Independent with basic ADLs;Independent with homemaking with ambulation;Independent with homemaking with wheelchair  Able to Take Stairs?: Yes Driving: Yes Vocation: Retired Tax adviser Overall Cognitive Status: Within Functional Limits for tasks assessed Arousal/Alertness: Awake/alert Orientation Level: Oriented X4 Attention: Selective Selective Attention: Appears intact Memory: Appears intact Awareness: Appears intact Problem Solving: Appears intact Safety/Judgment: Appears intact Sensation Sensation Light Touch: Appears Intact Proprioception: Appears Intact Coordination Gross Motor Movements are Fluid and Coordinated: No Fine Motor Movements are Fluid and Coordinated: No Coordination and Movement Description: mild decreased coorination on the RUE and RLE compared to the L.  Finger Nose Finger Test: decreased speed  Heel Shin Test:  Decreased speed Motor  Motor Motor: Hemiplegia Motor - Skilled Clinical Observations: decreased coordination on the R compared to L and mild hemiplegia  Mobility Bed Mobility Bed Mobility: Rolling Right;Rolling Left;Supine to Sit;Sit to Supine Rolling Right: 6: Modified independent (Device/Increase time) Rolling Left: 6: Modified independent (Device/Increase time) Supine to Sit: 5: Supervision Supine to Sit Details: Verbal cues for precautions/safety Sit to Supine: 5: Supervision Sit to Supine - Details: Verbal cues for precautions/safety Transfers Transfers: Yes Sit to Stand: 5: Supervision Sit to Stand Details: Verbal cues for technique;Verbal cues for precautions/safety Stand Pivot Transfers: 4: Min guard;4: Min assist Stand Pivot Transfer Details: Verbal cues for precautions/safety;Verbal cues for gait pattern Locomotion  Ambulation Ambulation: Yes Ambulation/Gait Assistance: 4: Min guard;4: Min assist Ambulation Distance (Feet): 150 Feet Assistive device: None Gait Gait: Yes Gait Pattern: Impaired Gait Pattern: Lateral hip instability;Poor foot clearance - right;Trendelenburg Stairs / Additional Locomotion Stairs: Yes Stairs Assistance: 4: Min assist Stairs Assistance Details: Verbal cues for sequencing;Verbal cues for technique;Verbal cues for gait pattern Stair Management Technique: Two rails Number of Stairs: 12 Height of Stairs: 6 (and 3) Product manager Mobility: Yes Wheelchair Assistance: 4: Advertising account executive Details: Verbal cues for sequencing;Verbal cues for Marketing executive: Both upper extremities Wheelchair Parts Management: Needs assistance Distance: 59f  Trunk/Postural Assessment  Cervical Assessment Cervical Assessment: Within Functional Limits Thoracic Assessment Thoracic Assessment: Within Functional Limits Lumbar Assessment Lumbar Assessment: Within Functional Limits Postural Control Postural  Control: Within Functional Limits  Balance Balance Balance Assessed: Yes Standardized Balance Assessment Standardized Balance Assessment: Berg Balance Test Berg Balance Test Sit to Stand: Able to stand without using hands and stabilize independently Standing Unsupported: Able to stand safely 2 minutes Sitting with Back Unsupported but Feet Supported on Floor or Stool: Able to sit safely and securely 2 minutes Stand to Sit: Controls descent by using hands Transfers: Able to transfer safely, minor use of hands Standing Unsupported with Eyes Closed: Able to stand 10 seconds safely Standing Ubsupported with Feet Together: Able to place feet together independently and stand for 1 minute with supervision From Standing, Reach Forward with Outstretched Arm: Can reach forward >12 cm safely (5") From Standing Position, Pick up Object from Floor: Able to pick up shoe, needs supervision From Standing Position, Turn to Look Behind Over each Shoulder: Looks behind one side only/other side shows less weight shift Turn 360 Degrees: Able to turn 360 degrees safely but slowly Standing Unsupported, Alternately Place Feet on Step/Stool: Able to complete >2 steps/needs minimal assist Standing Unsupported, One Foot in Front: Able to plae foot ahead of the other independently and hold 30 seconds Standing on One Leg: Able to lift leg independently and hold equal to or more than 3 seconds Total Score: 43 Static Sitting Balance Static Sitting - Balance Support: No upper extremity supported Static Sitting - Level of Assistance: 6: Modified independent (Device/Increase time) Dynamic Sitting Balance Dynamic Sitting - Balance Support: No upper extremity supported Dynamic Sitting - Level of Assistance: 6: Modified independent (Device/Increase time) Static Standing Balance Static Standing - Balance Support: No upper extremity supported Static Standing - Level of Assistance: 5: Stand by assistance Dynamic Standing  Balance Dynamic Standing - Balance Support: No upper extremity supported Dynamic Standing - Level of Assistance: 4: Min assist Dynamic Standing - Balance Activities: Reaching for objects Extremity Assessment      RLE Assessment RLE Assessment: Exceptions to [facility]OM (degrees) RLE Overall AROM Comments: WFL RLE Strength RLE Overall Strength Comments: Hip flexion and ankle DF 4/5 all others tested 4+/5 LLE Assessment LLE Assessment: Within Functional Limits   See Function Navigator for Current Functional Status.   Refer to Care Plan for Long Term Goals  Recommendations for other services: Therapeutic Recreation  Pet therapy, Kitchen group and Outing/community reintegration  Discharge Criteria: Patient will be discharged from PT if patient refuses treatment 3 consecutive times without medical reason, if treatment goals not met, if there is a change in medical status, if patient makes no progress towards goals or if patient is discharged from hospital.  The above assessment, treatment plan, treatment alternatives and goals were discussed and mutually agreed upon: by patient and by family  Lorie Phenix 07/03/2016, 2:07 PM

## 2016-07-03 NOTE — Evaluation (Signed)
Speech Language Pathology Assessment and Plan  Patient Details  Name: Deborah Wise MRN: [id number] Date of Birth: Mar 09, 1951  SLP Diagnosis: Dysarthria  Rehab Potential: Good ELOS:      Today's Date: 07/03/2016 SLP Individual Time: 5625-6389 SLP Individual Time Calculation (min): 60 min   Problem List:  Patient Active Problem List   Diagnosis Date Noted  . 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[place]) 07/02/2016  . Aneurysm of middle cerebral artery   . Cerebrovascular accident (CVA) due to thrombosis of left vertebral artery ([place]) 06/28/2016  . Cocaine abuse   . Stroke (cerebrum) ([place]) 06/27/2016  . Microcytic anemia 12/27/2014  . Thrombocytopenia ([place]) 12/25/2014  . Hypoglycemia 07/21/2013  . Diabetes mellitus ([place]) 07/21/2013  . Essential hypertension 07/21/2013  . Liver disease 07/21/2013  . Alcohol abuse 07/21/2013   Past Medical History:  Past Medical History:  Diagnosis Date  . Arthritis   . Diabetes mellitus without complication ([place])   . Fibroid, uterine   . GERD (gastroesophageal reflux disease)   . Hepatitis    hep C  . Hypertension   . Thrombocytopenia ([place]) 12/25/2014  . Urinary incontinence    Patient noticed mild and is not currently a significant problem   Past Surgical History:  Past Surgical History:  Procedure Laterality Date  . ANKLE SURGERY    . broken ankle    . MULTIPLE EXTRACTIONS WITH ALVEOLOPLASTY  10/25/2011   Procedure: MULTIPLE EXTRACION WITH ALVEOLOPLASTY;  Surgeon: Gae Bon, DDS;  Location: [facility];  Service: Oral Surgery;  Laterality: Bilateral;  Extract teeth numbers one, two, six, seven, eight, nine, ten, eleven, twelve, fifteen, sixteen, eighteen, nineteen, twenty-three, twenty-four, twenty-five, twenty-seven, thirty-two and alveoplasty.    Assessment / Plan / Recommendation Clinical Impression Deborah Gladneyis a 66 y.o.right handed femalewith history of diabetes mellitus, hypertension, hepatitis C, alcohol abuse with  liver cirrhosis and thrombocytopenia. Per chart review patient lives alone independently with occasionalprior to admission. One level home. She is acaregiver to both of her parents who are wheelchair-boundwho also have hired caregivers.She has a boyfriend that can assist.Presented 06/27/2016 with right-sided weakness, slurred speechand fall. Urine drug screen positive for cocaine. Cranial CT scan showed focal hypodensity within the left paramedian pons, suspicious for evolving acute ischemic infarct. Patient did not receive TPA. CT angiogram head and neck negative for emergent large vessel occlusion. 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 MRI 06/28/2016 with left paramedian pontine acute infarct stable in comparison to prior CT. No hemorrhage. Echocardiogram with ejection fraction 37% grade 1 diastolic dysfunction. No regional wall motion bnormalities. Carotid Dopplers with right 60-79% ICA stenosis.. Diagnostic angiogram 01/24/2018per interventional radiology again identified right carotid stenosis measuring 60%. Plan is to follow-up outpatient vascular surgery Dr. Trula Slade in 6 weeks with repeat carotid duplex.Aspirin for CVA prophylaxis. Subcutaneous Lovenox for DVT prophylaxis. Tolerating a regular diet. Physical and occupational therapy evaluations completed with recommendations of physical medicine rehabilitation consult.Patient was admitted for comprehensive rehabilitation program on 07/02/16.   Cognitive-linguistic evaluation completed on 07/03/16. Cognitive function appears wot be functional as pt received a score of 20 out of 22 on the [facility] Blind with average score considered to be greater than 18. Given history of drug and alcohol abuse, cognitive function appears at baseline. Currently pt presents with moderate dysarthria c/b imprecise articulation resulting in < 50% intelligiblity at the phrase level. Pt is unable to effectively communicate wants and  needs. Skilled ST services are required to increase functional  communication and independence prior to discharge. Uncertain at this time if pt will need follow-up ST services.    Skilled Therapeutic Interventions          Cognitive linguistic evaluation completed, see above. Speech evaluation also completed and pt able to recall speech intelligibility strategies with Mod A verbal cues to achieve speech intelligibility of <50% at phrase level during structured tasks within quiet environment. Pt's boyfriend present and confirms that pt's cognitive abilities are at baseline but functional communication is difficult to understand. Plan of care discussed and both voiced understanding and agreement with ST services recommended to address dysarthria.     SLP Assessment  Patient will need skilled [place] Pathology Services during CIR admission    Recommendations  Patient destination: Home Follow up Recommendations:  (TBD) Equipment Recommended: None recommended by SLP    SLP Frequency 3 to 5 out of 7 days   SLP Duration  SLP Intensity  SLP Treatment/Interventions    Minumum of 1-2 x/day, 30 to 90 minutes  Functional tasks;Patient/family education;Speech/Language facilitation    Pain    Prior Functioning Cognitive/Linguistic Baseline: Within functional limits Type of Home: Apartment  Lives With: Significant other Available Help at Discharge: Family;Available PRN/intermittently Education: finished 9th grade Vocation: On disability  Function:  Eating Eating                 Cognition Comprehension Comprehension assist level: Follows basic conversation/direction with extra time/assistive device  Expression   Expression assist level: Expresses basic 90% of the time/requires cueing < 10% of the time.  Social Interaction Social Interaction assist level: Interacts appropriately 90% of the time - Needs monitoring or encouragement for participation or interaction.  Problem  Solving Problem solving assist level: Solves basic 90% of the time/requires cueing < 10% of the time  Memory Memory assist level: More than reasonable amount of time   Short Term Goals: Week 1: SLP Short Term Goal 1 (Week 1): Pt will utilize speech intelligibility strategies at the phrase level with Mod A verbal cues to achieve 75% intelligibility.  SLP Short Term Goal 2 (Week 1): Pt will demonstrate self-monitoring and correction for use of compensatory strategies with speech intelligibility at the phrase level with Mod A verbal cues.  SLP Short Term Goal 3 (Week 1): Pt will recall speech intelligibility strategies with Min A verbal cues.   Refer to Care Plan for Long Term Goals  Recommendations for other services: Neuropsych  Discharge Criteria: Patient will be discharged from SLP if patient refuses treatment 3 consecutive times without medical reason, if treatment goals not met, if there is a change in medical status, if patient makes no progress towards goals or if patient is discharged from hospital.  The above assessment, treatment plan, treatment alternatives and goals were discussed and mutually agreed upon: by patient and by family  [name] [name], M.S., CCC-SLP Speech-Language Pathologist  [name] 07/03/2016, 11:45 AM

## 2016-07-03 NOTE — Progress Notes (Signed)
Deborah Wise is a 66 y.o. female 26-May-1951 [id number]  Subjective: No new complaints. No new problems. Slept well. Feeling OK.  Objective: Vital signs in last 24 hours: Temp:  [98.4 F (36.9 C)-99 F (37.2 C)] 98.4 F (36.9 C) (01/27 1426) Pulse Rate:  [66-79] 79 (01/27 1426) Resp:  [18] 18 (01/27 1426) BP: (138)/(61-65) 138/61 (01/27 1426) SpO2:  [100 %] 100 % (01/27 1426) Weight change:  Last BM Date: 06/30/16  Intake/Output from previous day: 01/26 0701 - 01/27 0700 In: 240 [P.O.:240] Out: -  Last cbgs: CBG (last 3)  No results for input(s): GLUCAP in the last 72 hours.   Physical Exam General: No apparent distress   HEENT: not dry Lungs: Normal effort. Lungs clear to auscultation, no crackles or wheezes. Cardiovascular: Regular rate and rhythm, no edema Abdomen: S/NT/ND; BS(+) Musculoskeletal:  unchanged Neurological: No new neurological deficits Wounds: N/A    Skin: clear  Aging changes Mental state: Alert, cooperative    Lab Results: BMET    Component Value Date/Time   NA 136 07/01/2016 0311   NA 136 12/31/2014 1429   K 3.9 07/01/2016 0311   K 3.9 12/31/2014 1429   CL 105 07/01/2016 0311   CO2 23 07/01/2016 0311   CO2 24 12/31/2014 1429   GLUCOSE 140 (H) 07/01/2016 0311   GLUCOSE 238 (H) 12/31/2014 1429   BUN 12 07/01/2016 0311   BUN 13.3 12/31/2014 1429   CREATININE 0.73 07/02/2016 1521   CREATININE 0.8 12/31/2014 1429   CALCIUM 9.5 07/01/2016 0311   CALCIUM 9.4 12/31/2014 1429   GFRNONAA >60 07/02/2016 1521   GFRAA >60 07/02/2016 1521   CBC    Component Value Date/Time   WBC 12.4 (H) 07/02/2016 1521   RBC 4.36 07/02/2016 1521   HGB 10.8 (L) 07/02/2016 1521   HGB 11.2 (L) 12/31/2014 1429   HCT 33.7 (L) 07/02/2016 1521   HCT 34.0 (L) 12/31/2014 1429   PLT 95 (L) 07/02/2016 1521   PLT 80 (L) 12/31/2014 1429   MCV 77.3 (L) 07/02/2016 1521   MCV 75.6 (L) 12/31/2014 1429   MCH 24.8 (L) 07/02/2016 1521   MCHC 32.0 07/02/2016 1521   RDW 16.3 (H) 07/02/2016 1521   RDW 15.2 (H) 12/31/2014 1429   LYMPHSABS 2.6 06/27/2016 2011   LYMPHSABS 1.3 12/31/2014 1429   MONOABS 0.6 06/27/2016 2011   MONOABS 0.5 12/31/2014 1429   EOSABS 0.0 06/27/2016 2011   EOSABS 0.0 12/31/2014 1429   BASOSABS 0.0 06/27/2016 2011   BASOSABS 0.0 12/31/2014 1429    Studies/Results: No results found.  Medications: I have reviewed the patient's current medications.  Assessment/Plan:   1. L pontine CVA. ASA qd.  2. DVT proph - Lovenox 3. Pain Management: Tylenol as needed 4. Mood: Provide emotional support 5. Neuropsych: This patient iscapable of making decisions on herown behalf. 6. Skin/Wound Care: Routine skin checks 7. Fluids/Electrolytes/Nutrition: Routine I&O with follow-up chemistries during admission 8.R ICA stenosis. Follow-up outpatient vascular surgery Dr. Trula Slade 6 weeks 9.HTN. Norvasc, HCTZ 10.Dyslipidemia - Lipitor 11.UDS positive for cocaine with history also of alcohol abuse. Provide counseling 12.History hepatitis C 13.Liver cirrhosis due to ETOH    Length of stay, days: 1  Walker Kehr , MD 07/03/2016, 8:35 PM

## 2016-07-04 ENCOUNTER — Inpatient Hospital Stay (HOSPITAL_COMMUNITY): Payer: Medicaid Other | Admitting: Speech Pathology

## 2016-07-04 ENCOUNTER — Inpatient Hospital Stay (HOSPITAL_COMMUNITY): Payer: Medicaid Other

## 2016-07-04 DIAGNOSIS — N390 Urinary tract infection, site not specified: Secondary | ICD-10-CM

## 2016-07-04 MED ORDER — AMPICILLIN 500 MG PO CAPS
500.0000 mg | ORAL_CAPSULE | Freq: Four times a day (QID) | ORAL | Status: DC
Start: 2016-07-04 — End: 2016-07-08
  Administered 2016-07-04 – 2016-07-08 (×16): 500 mg via ORAL
  Filled 2016-07-04 (×18): qty 1

## 2016-07-04 NOTE — Progress Notes (Signed)
Speech Language Pathology Daily Session Note  Patient Details  Name: Deborah Wise MRN: [id number] Date of Birth: 07/19/1950  Today's Date: 07/04/2016 SLP Individual Time: [id number] SLP Individual Time Calculation (min): 45 min  Short Term Goals: Week 1: SLP Short Term Goal 1 (Week 1): Pt will utilize speech intelligibility strategies at the phrase level with Mod A verbal cues to achieve 75% intelligibility.  SLP Short Term Goal 2 (Week 1): Pt will demonstrate self-monitoring and correction for use of compensatory strategies with speech intelligibility at the phrase level with Mod A verbal cues.  SLP Short Term Goal 3 (Week 1): Pt will recall speech intelligibility strategies with Min A verbal cues.   Skilled Therapeutic Interventions: Skilled treatment session focused on speech goals. SLP facilitated session by introducing speech intelligibility strategies. Pt required Max A verbal cues for use of strategies to achieve ~50% intelligibility with multisyllabic words. Pt required Max A verbal cues to self-monitor and correct errors. Pt's boyfriend present and strategies explained. Pt was returned to room, left in chair with boyfriend present and all needs within reach. Continue current plan of care.      Function:   Cognition Comprehension Comprehension assist level: Follows basic conversation/direction with extra time/assistive device  Expression   Expression assist level: Expresses basic 50 - 74% of the time/requires cueing 25 - 49% of the time. Needs to repeat parts of sentences.  Social Interaction Social Interaction assist level: Interacts appropriately 75 - 89% of the time - Needs redirection for appropriate language or to initiate interaction.  Problem Solving Problem solving assist level: Solves basic 75 - 89% of the time/requires cueing 10 - 24% of the time  Memory Memory assist level: More than reasonable amount of time    Pain    Therapy/Group: Individual Therapy [name]  [name], M.S., CCC-SLP Speech-Language Pathologist  [name] 07/04/2016, 10:19 AM

## 2016-07-04 NOTE — Progress Notes (Signed)
Deborah Wise is a 66 y.o. female July 04, 1950 [id number]  Subjective: No new complaints. Per staff - uine w/odor, ?vaginal d/c  Objective: Vital signs in last 24 hours: Temp:  [98.4 F (36.9 C)-98.9 F (37.2 C)] 98.9 F (37.2 C) (01/28 0452) Pulse Rate:  [74-79] 74 (01/28 0452) Resp:  [18] 18 (01/28 0452) BP: (136-138)/(59-61) 136/59 (01/28 0452) SpO2:  [99 %-100 %] 99 % (01/28 0452) Weight change:  Last BM Date: 06/30/16  Intake/Output from previous day: 01/27 0701 - 01/28 0700 In: 480 [P.O.:480] Out: -  Last cbgs: CBG (last 3)  No results for input(s): GLUCAP in the last 72 hours.   Physical Exam General: No apparent distress   HEENT: not dry Lungs: Normal effort. Lungs clear to auscultation, no crackles or wheezes. Cardiovascular: Regular rate and rhythm, no edema Abdomen: S/NT/ND; BS(+) Musculoskeletal:  unchanged Neurological: No new neurological deficits Wounds: N/A    Skin: clear  Aging changes Mental state: Alert, cooperative    Lab Results: BMET    Component Value Date/Time   NA 136 07/01/2016 0311   NA 136 12/31/2014 1429   K 3.9 07/01/2016 0311   K 3.9 12/31/2014 1429   CL 105 07/01/2016 0311   CO2 23 07/01/2016 0311   CO2 24 12/31/2014 1429   GLUCOSE 140 (H) 07/01/2016 0311   GLUCOSE 238 (H) 12/31/2014 1429   BUN 12 07/01/2016 0311   BUN 13.3 12/31/2014 1429   CREATININE 0.73 07/02/2016 1521   CREATININE 0.8 12/31/2014 1429   CALCIUM 9.5 07/01/2016 0311   CALCIUM 9.4 12/31/2014 1429   GFRNONAA >60 07/02/2016 1521   GFRAA >60 07/02/2016 1521   CBC    Component Value Date/Time   WBC 12.4 (H) 07/02/2016 1521   RBC 4.36 07/02/2016 1521   HGB 10.8 (L) 07/02/2016 1521   HGB 11.2 (L) 12/31/2014 1429   HCT 33.7 (L) 07/02/2016 1521   HCT 34.0 (L) 12/31/2014 1429   PLT 95 (L) 07/02/2016 1521   PLT 80 (L) 12/31/2014 1429   MCV 77.3 (L) 07/02/2016 1521   MCV 75.6 (L) 12/31/2014 1429   MCH 24.8 (L) 07/02/2016 1521   MCHC 32.0 07/02/2016  1521   RDW 16.3 (H) 07/02/2016 1521   RDW 15.2 (H) 12/31/2014 1429   LYMPHSABS 2.6 06/27/2016 2011   LYMPHSABS 1.3 12/31/2014 1429   MONOABS 0.6 06/27/2016 2011   MONOABS 0.5 12/31/2014 1429   EOSABS 0.0 06/27/2016 2011   EOSABS 0.0 12/31/2014 1429   BASOSABS 0.0 06/27/2016 2011   BASOSABS 0.0 12/31/2014 1429    Studies/Results: No results found.  Medications: I have reviewed the patient's current medications.  A/P:  1. Left pontine CVA. Continue with daily aspirin 2. DVT prophylaxis with Lovenox 3. Pretension continue with Norvasc and HCTZ 4. Dyslipidemia. Continue with Lipitor 5. Urine drug screen positive for cocaine with the previous history of alcohol abuse. Counseling was provided previously. 6. History of hepatitis C 7. Liver cirrhosis due to alcohol use most likely. Monitor liver tests. 8. Chronic urinary tract infection with cultures positive for enterococcus faecalis. Will treat with ampicillin    Length of stay, days: 2  Walker Kehr , MD 07/04/2016, 10:30 AM

## 2016-07-04 NOTE — Progress Notes (Signed)
Occupational Therapy Session Note  Patient Details  Name: Deborah Wise MRN: [id number] Date of Birth: 30-Apr-1951  Today's Date: 07/04/2016 OT Individual Time: 1130-1200 OT Individual Time Calculation (min): 30 min   Short Term Goals: Week 1:  OT Short Term Goal 1 (Week 1): STG=LTG d/t short anticipated LOS  Skilled Therapeutic Interventions/Progress Updates: Therapeutic activities with focus on HEP instruction to improve [facility] of right hand.   Pt instructed on benefit of shuffling cards, assembling puzzle pieces, and additional activities, to enhance function of right hand.  Pt completed bug puzzle assembly, 15 pieces, and played two card games with mod vc to use right hand as dominant hand during tasks with significant other encouraging her during tasks.         Therapy Documentation Precautions:  Precautions Precautions: Fall Restrictions Weight Bearing Restrictions: No   Pain: No/denies pain    ADL: ADL ADL Comments: Please see functional navigator for ADL status   See Function Navigator for Current Functional Status.   Therapy/Group: Individual Therapy  [place] 07/04/2016, 12:55 PM

## 2016-07-05 ENCOUNTER — Inpatient Hospital Stay (HOSPITAL_COMMUNITY): Payer: Medicaid Other | Admitting: Physical Therapy

## 2016-07-05 ENCOUNTER — Inpatient Hospital Stay (HOSPITAL_COMMUNITY): Payer: Medicaid Other | Admitting: Speech Pathology

## 2016-07-05 ENCOUNTER — Inpatient Hospital Stay (HOSPITAL_COMMUNITY): Payer: Medicaid Other | Admitting: Occupational Therapy

## 2016-07-05 ENCOUNTER — Encounter (HOSPITAL_COMMUNITY): Payer: Self-pay | Admitting: Interventional Radiology

## 2016-07-05 DIAGNOSIS — R300 Dysuria: Secondary | ICD-10-CM

## 2016-07-05 LAB — COMPREHENSIVE METABOLIC PANEL
ALT: 70 U/L — ABNORMAL HIGH (ref 14–54)
AST: 82 U/L — ABNORMAL HIGH (ref 15–41)
Albumin: 3 g/dL — ABNORMAL LOW (ref 3.5–5.0)
Alkaline Phosphatase: 95 U/L (ref 38–126)
Anion gap: 8 (ref 5–15)
BUN: 20 mg/dL (ref 6–20)
CO2: 23 mmol/L (ref 22–32)
Calcium: 9.4 mg/dL (ref 8.9–10.3)
Chloride: 105 mmol/L (ref 101–111)
Creatinine, Ser: 0.78 mg/dL (ref 0.44–1.00)
GFR calc Af Amer: >60 mL/min (ref >60)
GFR calc non Af Amer: >60 mL/min (ref >60)
Glucose, Bld: 115 mg/dL — ABNORMAL HIGH (ref 65–99)
Potassium: 4 mmol/L (ref 3.5–5.1)
Sodium: 136 mmol/L (ref 135–145)
Total Bilirubin: 0.8 mg/dL (ref 0.3–1.2)
Total Protein: 8.1 g/dL (ref 6.5–8.1)

## 2016-07-05 LAB — URINALYSIS, ROUTINE W REFLEX MICROSCOPIC
Bilirubin Urine: NEGATIVE
Glucose, UA: NEGATIVE mg/dL
Ketones, ur: NEGATIVE mg/dL
Nitrite: NEGATIVE
Protein, ur: 30 mg/dL — AB
Specific Gravity, Urine: 1.016 (ref 1.005–1.030)
pH: 7 (ref 5.0–8.0)

## 2016-07-05 LAB — CBC WITH DIFFERENTIAL/PLATELET
Basophils Absolute: 0.1 10*3/uL (ref 0.0–0.1)
Basophils Relative: 1 %
Eosinophils Absolute: 0.1 10*3/uL (ref 0.0–0.7)
Eosinophils Relative: 1 %
HCT: 32.3 % — ABNORMAL LOW (ref 36.0–46.0)
Hemoglobin: 10.4 g/dL — ABNORMAL LOW (ref 12.0–15.0)
Lymphocytes Relative: 32 %
Lymphs Abs: 3.1 10*3/uL (ref 0.7–4.0)
MCH: 24.5 pg — ABNORMAL LOW (ref 26.0–34.0)
MCHC: 32.2 g/dL (ref 30.0–36.0)
MCV: 76 fL — ABNORMAL LOW (ref 78.0–100.0)
Monocytes Absolute: 0.9 10*3/uL (ref 0.1–1.0)
Monocytes Relative: 9 %
Neutro Abs: 5.5 10*3/uL (ref 1.7–7.7)
Neutrophils Relative %: 57 %
Platelets: 105 10*3/uL — ABNORMAL LOW (ref 150–400)
RBC: 4.25 MIL/uL (ref 3.87–5.11)
RDW: 15.3 % (ref 11.5–15.5)
WBC: 9.7 10*3/uL (ref 4.0–10.5)

## 2016-07-05 NOTE — Progress Notes (Signed)
Complained of headache, PRN tylenol given at 2007. LBM 01/28 after sorbitol.Deborah Wise A

## 2016-07-05 NOTE — Progress Notes (Signed)
Occupational Therapy Session Note  Patient Details  Name: Marium Petri MRN: [id number] Date of Birth: 1951-01-10  Today's Date: 07/05/2016 OT Individual Time: 1100-1109 OT Individual Time Calculation (min): 9 min    Short Term Goals: Week 1:  OT Short Term Goal 1 (Week 1): STG=LTG d/t short anticipated LOS  Skilled Therapeutic Interventions/Progress Updates:  Pt found seated in recliner, calling out to use the bathroom. Assisted pt with transfer into BR. Pt overall supervision for functional ambulation in room, toilet transfer, peri cleansing/hygiene, clothing management, standing at sink to wash hands. Left pt with another OT to complete remaining ADL.   Therapy Documentation Precautions:  Precautions Precautions: Fall Restrictions Weight Bearing Restrictions: No  ADL: ADL ADL Comments: Please see functional navigator for ADL status  See Function Navigator for Current Functional Status.  Therapy/Group: Individual Therapy  Chrys Racer , MS, OTR/L, CLT  07/05/2016, 12:43 PM

## 2016-07-05 NOTE — Care Management Note (Signed)
Inpatient Rehabilitation Center Individual Statement of Services  Patient Name:  Deborah Wise  Date:  07/05/2016  Welcome to the [facility].  Our goal is to provide you with an individualized program based on your diagnosis and situation, designed to meet your specific needs.  With this comprehensive rehabilitation program, you will be expected to participate in at least 3 hours of rehabilitation therapies Monday-Friday, with modified therapy programming on the weekends.  Your rehabilitation program will include the following services:  Physical Therapy (PT), Occupational Therapy (OT), Speech Therapy (ST), 24 hour per day rehabilitation nursing, Therapeutic Recreaction (TR), Neuropsychology, Case Management (Social Worker), Rehabilitation Medicine, Nutrition Services and Pharmacy Services  Weekly team conferences will be held on Wednesday to discuss your progress.  Your Social Worker will talk with you frequently to get your input and to update you on team discussions.  Team conferences with you and your family in attendance may also be held.  Expected length of stay: 7-10 days  Overall anticipated outcome: mod/i-supervision with bathing  Depending on your progress and recovery, your program may change. Your Social Worker will coordinate services and will keep you informed of any changes. Your Social Worker's name and contact numbers are listed  below.  The following services may also be recommended but are not provided by the [facility]:    [place] will be made to provide these services after discharge if needed.  Arrangements include referral to agencies that provide these services.  Your insurance has been verified to be:  Medicaid Your primary doctor is:  Dr Nicki Reaper  Pertinent information will be shared with your doctor and your insurance company.  Social  Worker:  Ovidio Kin, [place] or ([phone]  Information discussed with and copy given to patient by: Deborah Wise, 07/05/2016, 11:28 AM

## 2016-07-05 NOTE — IPOC Note (Signed)
Overall Plan of Care [facility]) Patient Details Name: Deborah Wise MRN: [id number] DOB: [birth year]  Admitting Diagnosis: CVA  Hospital Problems: Principal Problem:   Left pontine cerebrovascular accident [facility]) Active Problems:   Essential hypertension     Functional Problem List: Nursing Bladder, Endurance, Motor, Safety  PT Balance, Motor, Endurance, Safety, Sensory  OT Balance, Safety, Endurance, Motor  SLP    TR         Basic ADL's: OT Grooming, Bathing, Dressing, Toileting     Advanced  ADL's: OT Simple Meal Preparation     Transfers: PT Bed Mobility, Bed to Chair, Car, [employer], Futures trader, Metallurgist: PT Ambulation, Emergency planning/management officer, Stairs     Additional Impairments: OT Fuctional Use of Upper Extremity  SLP None      TR      Anticipated Outcomes Item Anticipated Outcome  Self Feeding N/A  Swallowing      Basic self-care  Supervision-Mod I   Toileting  Mod I    Bathroom Transfers Supervision-Mod I   Bowel/Bladder  LBM 06/30/16 per pt.  Pt will be continent of bowel and bladder  Transfers  Mod I   Locomotion  Mod I with LRAD   Communication  Min A  Cognition     Pain  less than 7  Safety/Judgment  pt will remain free from falls during admission   Therapy Plan: PT Intensity: Minimum of 1-2 x/day ,45 to 90 minutes PT Frequency: 5 out of 7 days PT Duration Estimated Length of Stay: 7-10 OT Intensity: Minimum of 1-2 x/day, 45 to 90 minutes OT Frequency: 5 out of 7 days OT Duration/Estimated Length of Stay: 7-10 days SLP Intensity: Minumum of 1-2 x/day, 30 to 90 minutes SLP Frequency: 3 to 5 out of 7 days       Team Interventions: Nursing Interventions Patient/Family Education, Bladder Management, Disease Management/Prevention, Dysphagia/Aspiration Precaution Training  PT interventions Ambulation/gait training, Training and development officer, Cognitive remediation/compensation, Community reintegration, Discharge  planning, Disease management/prevention, DME/adaptive equipment instruction, Functional mobility training, Neuromuscular re-education, Pain management, Patient/family education, Psychosocial support, Skin care/wound management, Stair training, Therapeutic Activities, Therapeutic Exercise, UE/LE Strength taining/ROM, UE/LE Coordination activities, Visual/perceptual remediation/compensation, Wheelchair propulsion/positioning  OT Interventions Discharge planning, Self Care/advanced ADL retraining, Therapeutic Activities, UE/LE Coordination activities, Cognitive remediation/compensation, Functional mobility training, Patient/family education, Therapeutic Exercise, DME/adaptive equipment instruction, Neuromuscular re-education, Psychosocial support, UE/LE Strength taining/ROM  SLP Interventions Functional tasks, Patient/family education, Speech/Language facilitation  TR Interventions    SW/CM Interventions Discharge Planning, Psychosocial Support, Patient/Family Education    Team Discharge Planning: Destination: PT-Home ,OT- Home , SLP-Home Projected Follow-up: PT-Home health PT, OT-  Outpatient OT, SLP- (TBD) Projected Equipment Needs: PT-To be determined, OT- Tub/shower bench, To be determined, SLP-None recommended by SLP Equipment Details: PT- , OT-  Patient/family involved in discharge planning: PT- Patient, Family member/caregiver,  OT-Patient, Other (comment) (Boyfriend Mr. Hopper), SLP-Patient, Family member/caregiver  MD ELOS: 7-8d Medical Rehab Prognosis:  Good Assessment:  66 y.o.right handed femalewith history of diabetes mellitus, hypertension, hepatitis C, alcohol abuse with liver cirrhosis and thrombocytopenia. Per chart review patient lives alone independently with occasionalprior to admission. One level home. She is acaregiver to both of her parents who are wheelchair-boundwho also have hired caregivers.She has a boyfriend that can assist.Presented 06/27/2016 with right-sided  weakness, slurred speechand fall. Urine drug screen positive for cocaine. Cranial CT scan showed focal hypodensity within the left paramedian pons, suspicious for evolving acute ischemic infarct. Patient did not receive TPA. CT angiogram  head and neck negative for emergent large vessel occlusion. 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 MRI 06/28/2016 with left paramedian pontine acute infarct stable in comparison to prior CT. No hemorrhage. Echocardiogram with ejection fraction [id number] grade 1 diastolic dysfunction. No regional wall motion abnormalities. Carotid Dopplers with right 60-79% ICA stenosis.. Diagnostic angiogram 01/24/2018per interventional radiology again identified right carotid stenosis measuring 60%. Plan is to follow-up outpatient vascular surgery Dr. Trula Slade in 6 weeks with repeat carotid duplex.   Now requiring 24/7 Rehab RN,MD, as well as CIR level PT, OT and SLP.  Treatment team will focus on ADLs and mobility with goals set at Sup / Mod I See Team Conference Notes for weekly updates to the plan of care

## 2016-07-05 NOTE — Progress Notes (Signed)
Speech Language Pathology Daily Session Notes  Patient Details  Name: Deborah Wise MRN: [id number] Date of Birth: 04/01/1951  Today's Date: 07/05/2016   Session 1: SLP Individual Time: 1030-1100 SLP Individual Time Calculation (min): 30 min  Short Term Goals: Week 1: SLP Short Term Goal 1 (Week 1): Pt will utilize speech intelligibility strategies at the phrase level with Mod A verbal cues to achieve 75% intelligibility.  SLP Short Term Goal 2 (Week 1): Pt will demonstrate self-monitoring and correction for use of compensatory strategies with speech intelligibility at the phrase level with Mod A verbal cues.  SLP Short Term Goal 3 (Week 1): Pt will recall speech intelligibility strategies with Min A verbal cues.   Skilled Therapeutic Interventions:  Session 1: Skilled treatment session focused on speech goals. SLP facilitated session by providing Mod-Max A verbal and nonverbal cues for patient to utilize speech intelligibility strategies at the sentence level to achieve ~75% intelligibility during a structured verbal description task.  Patient also utilized the telephone to order her lunch meal and required Mod A verbal cues for use of speech intelligibility strategies to decrease the amount of  repetition needed to an unfamiliar communication partner. Patient left upright in chair with all needs within reach. Continue with current plan of care.    Function:   Cognition Comprehension Comprehension assist level: Follows basic conversation/direction with extra time/assistive device  Expression   Expression assist level: Expresses basic 50 - 74% of the time/requires cueing 25 - 49% of the time. Needs to repeat parts of sentences.  Social Interaction Social Interaction assist level: Interacts appropriately 75 - 89% of the time - Needs redirection for appropriate language or to initiate interaction.  Problem Solving Problem solving assist level: Solves basic 75 - 89% of the time/requires  cueing 10 - 24% of the time  Memory Memory assist level: More than reasonable amount of time    Pain No/Denies Pain   Therapy/Group: Individual Therapy  PAYNE, COURTNEY 07/05/2016, 12:24 PM

## 2016-07-05 NOTE — Progress Notes (Signed)
Physical Therapy Session Note  Patient Details  Name: Deborah Wise MRN: [id number] Date of Birth: 11/18/1950  Today's Date: 07/05/2016 PT Individual Time: 0900-0956 PT Individual Time Calculation (min): 56 min   Short Term Goals: Week 1:  PT Short Term Goal 1 (Week 1): STG=LTG due to ELOS  Skilled Therapeutic Interventions/Progress Updates:    no c/o pain   Session focus on strengthening, activity tolerance, RLE NMR, balance, gait, transfers, and pt safety.   Pt transfers sit<>stand throughout session with supervision for safety.  Gait training throughout unit with close supervision, up to min assist with high level ambulation side stepping retro stepping, and grape vine stepping.  Pt requires steady assist for backwards walking and up to mod assist for side stepping and grape vine with 2 HHA.   NMR and strengthening for RLE and core as follows: single support bridging with RLE, clamshells with level 3 theraband, SLR, toe taps to target, alternating toe taps on 6" box, criss cross toe taps on 6" box, and alternating UE/LE lifts in quadruped.  Nustep x10 minutes at level 4 with 4 extremities for reciprocal stepping pattern retraining, activity tolerance, and cardiopulmonary exercise.  Rest breaks provided throughout as needed for fatigue.    Pt returned to room at end of session, positioned in straight back chair next to bed, verbalized understanding of instructions to not try to get up without assist from staff.  Call bell in reach and significant other present.   Therapy Documentation Precautions:  Precautions Precautions: Fall Restrictions Weight Bearing Restrictions: No   See Function Navigator for Current Functional Status.   Therapy/Group: Individual Therapy  Caitlin E Penven-Crew 07/05/2016, 9:35 AM

## 2016-07-05 NOTE — Progress Notes (Signed)
Physical Therapy Session Note  Patient Details  Name: Deborah Wise MRN: [id number] Date of Birth: 12-Dec-1950  Today's Date: 07/05/2016 PT Individual Time: 1300-1345 PT Individual Time Calculation (min): 45 min   Short Term Goals: Week 1:  PT Short Term Goal 1 (Week 1): STG=LTG due to ELOS  Skilled Therapeutic Interventions/Progress Updates:    no c/o pain but does endorse fatigue.  Session focus on balance, activity tolerance, visual scanning, RUE functional use, and dual task.    Pt ambulates throughout unit with supervision, decreased R step length, and R trunk lateral lean with R stance phase.  D[employer] for standing tolerance, dynamic standing balance, functional use of RUE, visual scanning, and dual task.  Pt required mod cues on first trial to follow instructions correctly and press only the red lights (pt attempting to push any button on board regardless of whether or not it was lit).  On second 2 trials pt scored 22/2.7 second reaction time and 24/2.5 second reaction time respectively.  Progress to dual task with pt selecting red lights and reading words off of t-scope, 36 hits with 3.33 reaction time and 15/20 words.  Explained significance of decreased reaction time in relation to daily activities and pt verbalized understanding.  Static standing tolerance/balance on foam pad for ankle strategy while engaged in RUE task recreated peg board pattern with supervision.  RUE fine motor NMR retrieving beads from theraputty 2 trials (2.22 minutes to retrieve 10 beads and 4.44 minutes for same task) for strengthening, coordination, and endurance.  Pt returned to room at end of session, positioned in bed with call bell in reach and needs met.   Therapy Documentation Precautions:  Precautions Precautions: Fall Restrictions Weight Bearing Restrictions: No   See Function Navigator for Current Functional Status.   Therapy/Group: Individual Therapy  Earnest Conroy Penven-Crew 07/05/2016,  1:47 PM

## 2016-07-05 NOTE — Progress Notes (Signed)
Occupational Therapy Session Note  Patient Details  Name: Deborah Wise MRN: [id number] Date of Birth: Jan 09, 1951  Today's Date: 07/05/2016 OT Individual Time: [id number] OT Individual Time Calculation (min): 54 min    Short Term Goals: Week 1:  OT Short Term Goal 1 (Week 1): STG=LTG d/t short anticipated LOS  Skilled Therapeutic Interventions/Progress Updates:    Treatment session with focus on functional mobility, dynamic standing balance, and functional use of RUE.  Pt received in standing at sink washing hands post toileting.  Ambulated to room shower without AD with supervision.  Pt completed bathing at sit > stand level with intermittent supervision.  Dressing completed with setup assist to don TEDS, with discussion regarding purpose for TEDS.  Pt completed grooming in standing with use of RUE.  Ambulated to Dayroom with intermittent supervision.  Engaged in [place] fine motor control activity in conjunction with Connect 4 with increased challenge to incorporate stacking, in-hand manipulation, translation, and cognitive task of following sequence of activity.  Noted mild difficulty when reaching >90 degrees, however pt reports no pain or perceived difficulty.  Discussed functional use of RUE with self-feeding and grooming tasks to which pt reports mild deficits but continues to incorporate RUE into tasks.  Therapy Documentation Precautions:  Precautions Precautions: Fall Restrictions Weight Bearing Restrictions: No Pain:  Pt with no c/o pain  See Function Navigator for Current Functional Status.   Therapy/Group: Individual Therapy  Simonne Come 07/05/2016, 12:04 PM

## 2016-07-05 NOTE — Progress Notes (Signed)
Subjective/Complaints: Patient asking for grounds pass, wants to get out of the confines of the unit. Boyfriend is at bedside. He states that he could go with her. We discussed that this should not interfere with therapy.  No pain complaints today. Does note some vaginal itching. He also noted some blood in urine, although she thought it was leaking from her catheter insertion site left groin Review of systems negative for nausea, vomiting, diarrhea, constipation, bowel or bladder incontinence, chest pains or shortness of breath Objective: Vital Signs: Blood pressure (!) 134/46, pulse 62, temperature 98.4 F (36.9 C), temperature source Oral, resp. rate 18, height _0  (1.651 m), weight 72.5 kg (159 lb 12.8 oz), SpO2 100 %. No results found. Results for orders placed or performed during the hospital encounter of 07/02/16 (from the past 72 hour(s))  CBC     Status: Abnormal   Collection Time: 07/02/16  3:21 PM  Result Value Ref Range   WBC 12.4 (H) 4.0 - 10.5 K/uL   RBC 4.36 3.87 - 5.11 MIL/uL   Hemoglobin 10.8 (L) 12.0 - 15.0 g/dL   HCT 33.7 (L) 36.0 - 46.0 %   MCV 77.3 (L) 78.0 - 100.0 fL   MCH 24.8 (L) 26.0 - 34.0 pg   MCHC 32.0 30.0 - 36.0 g/dL   RDW 16.3 (H) 11.5 - 15.5 %   Platelets 95 (L) 150 - 400 K/uL    Comment: REPEATED TO VERIFY SPECIMEN CHECKED FOR CLOTS PLATELETS APPEAR DECREASED PLATELET COUNT CONFIRMED BY SMEAR   Creatinine, serum     Status: None   Collection Time: 07/02/16  3:21 PM  Result Value Ref Range   Creatinine, Ser 0.73 0.44 - 1.00 mg/dL   GFR calc non Af Amer >60 >60 mL/min   GFR calc Af Amer >60 >60 mL/min    Comment: (NOTE) The eGFR has been calculated using the CKD EPI equation. This calculation has not been validated in all clinical situations. eGFR's persistently <60 mL/min signify possible Chronic Kidney Disease.   CBC WITH DIFFERENTIAL     Status: Abnormal (Preliminary result)   Collection Time: 07/05/16  5:37 AM  Result Value Ref Range    WBC 9.7 4.0 - 10.5 K/uL   RBC 4.25 3.87 - 5.11 MIL/uL   Hemoglobin 10.4 (L) 12.0 - 15.0 g/dL   HCT 32.3 (L) 36.0 - 46.0 %   MCV 76.0 (L) 78.0 - 100.0 fL   MCH 24.5 (L) 26.0 - 34.0 pg   MCHC 32.2 30.0 - 36.0 g/dL   RDW 15.3 11.5 - 15.5 %   Platelets 105 (L) 150 - 400 K/uL    Comment: REPEATED TO VERIFY CONSISTENT WITH PREVIOUS RESULT    Neutrophils Relative % PENDING %   Neutro Abs PENDING 1.7 - 7.7 K/uL   Band Neutrophils PENDING %   Lymphocytes Relative PENDING %   Lymphs Abs PENDING 0.7 - 4.0 K/uL   Monocytes Relative PENDING %   Monocytes Absolute PENDING 0.1 - 1.0 K/uL   Eosinophils Relative PENDING %   Eosinophils Absolute PENDING 0.0 - 0.7 K/uL   Basophils Relative PENDING %   Basophils Absolute PENDING 0.0 - 0.1 K/uL   WBC Morphology PENDING    RBC Morphology PENDING    Smear Review PENDING    nRBC PENDING 0 /100 WBC   Metamyelocytes Relative PENDING %   Myelocytes PENDING %   Promyelocytes Absolute PENDING %   Blasts PENDING %  Comprehensive metabolic panel     Status: Abnormal  Collection Time: 07/05/16  5:37 AM  Result Value Ref Range   Sodium 136 135 - 145 mmol/L   Potassium 4.0 3.5 - 5.1 mmol/L   Chloride 105 101 - 111 mmol/L   CO2 23 22 - 32 mmol/L   Glucose, Bld 115 (H) 65 - 99 mg/dL   BUN 20 6 - 20 mg/dL   Creatinine, Ser 0.78 0.44 - 1.00 mg/dL   Calcium 9.4 8.9 - 10.3 mg/dL   Total Protein 8.1 6.5 - 8.1 g/dL   Albumin 3.0 (L) 3.5 - 5.0 g/dL   AST 82 (H) 15 - 41 U/L   ALT 70 (H) 14 - 54 U/L   Alkaline Phosphatase 95 38 - 126 U/L   Total Bilirubin 0.8 0.3 - 1.2 mg/dL   GFR calc non Af Amer >60 >60 mL/min   GFR calc Af Amer >60 >60 mL/min    Comment: (NOTE) The eGFR has been calculated using the CKD EPI equation. This calculation has not been validated in all clinical situations. eGFR's persistently <60 mL/min signify possible Chronic Kidney Disease.    Anion gap 8 5 - 15      General: No acute distress Mood and affect are appropriate Heart:  Regular rate and rhythm no rubs murmurs or extra sounds Lungs: Clear to auscultation, breathing unlabored, no rales or wheezes Abdomen: Positive bowel sounds, soft nontender to palpation, nondistended Extremities: No clubbing, cyanosis, or edema Skin: No evidence of breakdown, no evidence of rash, left groin healed insertion site from catheterization. No evidence of drainage or erythema Neurologic: Cranial nerves II through XII intact, motor strength is 5/5 in left deltoid, bicep, tricep, grip, hip flexor, knee extensors, ankle dorsiflexor and plantar flexor are 4/5 in the right deltoid, biceps, triceps, grip, hip flexor, knee extensor, ankle dorsiflexor Sensory exam normal sensation to light touch and proprioception in bilateral upper and lower extremities  Musculoskeletal: Full range of motion in all 4 extremities. No joint swelling   Assessment/Plan: 1. 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 which require 3+ hours per day of interdisciplinary therapy in a comprehensive inpatient rehab setting. Physiatrist is providing close team supervision and 24 hour management of active medical problems listed below. Physiatrist and rehab team continue to assess barriers to discharge/monitor patient progress toward functional and medical goals. FIM: Function - Bathing Position: Shower Body parts bathed by patient: Right arm, Left arm, Chest, Abdomen, Front perineal area, Buttocks, Right upper leg, Left upper leg, Right lower leg, Left lower leg, Back Assist Level: Touching or steadying assistance(Pt > 75%)  Function- Upper Body Dressing/Undressing What is the patient wearing?: Pull over shirt/dress Pull over shirt/dress - Perfomed by patient: Thread/unthread right sleeve, Thread/unthread left sleeve, Put head through opening, Pull shirt over trunk Assist Level: Supervision or verbal cues Function - Lower Body Dressing/Undressing What is the patient  wearing?: Pants, Ted Hose, Non-skid slipper socks Pants- Performed by patient: Thread/unthread right pants leg, Thread/unthread left pants leg, Pull pants up/down Non-skid slipper socks- Performed by patient: Don/doff right sock, Don/doff left sock TED Hose - Performed by helper: Don/doff right TED hose, Don/doff left TED hose Assist for lower body dressing: Touching or steadying assistance (Pt > 75%)  Function - Toileting Toileting steps completed by patient: Adjust clothing prior to toileting, Performs perineal hygiene, Adjust clothing after toileting Assist level: Touching or steadying assistance (Pt.75%)  Function - Toilet Transfers Toilet transfer assistive device: Grab bar Assist level to toilet: Touching or steadying assistance (Pt >  75%) Assist level from toilet: Touching or steadying assistance (Pt > 75%)  Function - Chair/bed transfer Chair/bed transfer method: Ambulatory Chair/bed transfer assist level: Touching or steadying assistance (Pt > 75%) Chair/bed transfer details: Verbal cues for precautions/safety  Function - Locomotion: Wheelchair Will patient use wheelchair at discharge?: No Type: Manual Max wheelchair distance: 23f  Assist Level: Touching or steadying assistance (Pt > 75%) Assist Level: Touching or steadying assistance (Pt > 75%) Wheel 150 feet activity did not occur: Refused Turns around,maneuvers to table,bed, and toilet,negotiates 3% grade,maneuvers on rugs and over doorsills: No Function - Locomotion: Ambulation Assistive device: No device Max distance: 157f Assist level: Touching or steadying assistance (Pt > 75%) Assist level: Touching or steadying assistance (Pt > 75%) Assist level: Touching or steadying assistance (Pt > 75%) Assist level: Touching or steadying assistance (Pt > 75%) Assist level: Touching or steadying assistance (Pt > 75%)  Function - Comprehension Comprehension: Auditory Comprehension assist level: Follows basic  conversation/direction with extra time/assistive device  Function - Expression Expression: Verbal Expression assist level: Expresses basic 90% of the time/requires cueing < 10% of the time.  Function - Social Interaction Social Interaction assist level: Interacts appropriately 75 - 89% of the time - Needs redirection for appropriate language or to initiate interaction.  Function - Problem Solving Problem solving assist level: Solves basic 75 - 89% of the time/requires cueing 10 - 24% of the time  Function - Memory Memory assist level: More than reasonable amount of time Patient normally able to recall (first 3 days only): Current season, Location of own room, Staff names and faces, That he or she is in a hospital  Medical Problem List and Plan: 1. 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 Continue aspirin therapy -CIR PT, OT, speech 2. DVT Prophylaxis/Anticoagulation: Subcutaneous Lovenox. Monitor platelet counts and any signs of bleeding 3. Pain Management: Tylenol as needed 4. Mood: Provide emotional support 5. Neuropsych: This patient iscapable of making decisions on herown behalf. 6. Skin/Wound Care: Routine skin checks 7. 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 Follow-up outpatient vascular surgery Dr. BrTrula Slade weeks 9.Hypertension. Norvasc 10 mg daily, HCTZ 25 mg daily. Monitor with increased mobility Vitals:   07/04/16 1530 07/05/16 0535  BP: (!) 150/74 (!) 134/46  Pulse: 82 62  Resp: 19 18  Temp: 98.1 F (36.7 C) 98.4 F (36.9 C)   10.Hyperlipidemia. Lipitor 11.UDS positive for cocaine with history also of alcohol abuse. Provide counseling, neuropsych 12.History hepatitis C 13. History of UTI. We'll recheck UA, CNS, given ongoing symptoms LOS (Days) 3 A FACE TO FACE EVALUATION WAS  PERFORMED  Lanie Schelling E 07/05/2016, 8:36 AM

## 2016-07-05 NOTE — Progress Notes (Signed)
Social Work Assessment and Plan Social Work Assessment and Plan  Patient Details  Name: Deborah Wise MRN: [id number] Date of Birth: [birth year]  Today's Date: 07/05/2016  Problem List:  Patient Active Problem List   Diagnosis Date Noted  . 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[place]) 07/02/2016  . Aneurysm of middle cerebral artery   . Cerebrovascular accident (CVA) due to thrombosis of left vertebral artery ([place]) 06/28/2016  . Cocaine abuse   . Stroke (cerebrum) ([place]) 06/27/2016  . Microcytic anemia 12/27/2014  . Thrombocytopenia ([place]) 12/25/2014  . Hypoglycemia 07/21/2013  . Diabetes mellitus ([place]) 07/21/2013  . Essential hypertension 07/21/2013  . Liver disease 07/21/2013  . Alcohol abuse 07/21/2013   Past Medical History:  Past Medical History:  Diagnosis Date  . Arthritis   . Diabetes mellitus without complication ([place])   . Fibroid, uterine   . GERD (gastroesophageal reflux disease)   . Hepatitis    hep C  . Hypertension   . Thrombocytopenia ([place]) 12/25/2014  . Urinary incontinence    Patient noticed mild and is not currently a significant problem   Past Surgical History:  Past Surgical History:  Procedure Laterality Date  . ANKLE SURGERY    . broken ankle    . IR GENERIC HISTORICAL  06/30/2016   IR ANGIO VERTEBRAL SEL SUBCLAVIAN INNOMINATE UNI R MOD SED 06/30/2016 [name], MD [facility]  . IR GENERIC HISTORICAL  06/30/2016   IR ANGIO VERTEBRAL SEL VERTEBRAL UNI L MOD SED 06/30/2016 [name], MD [facility]  . IR GENERIC HISTORICAL  06/30/2016   IR ANGIO INTRA EXTRACRAN SEL COM CAROTID INNOMINATE BILAT MOD SED 06/30/2016 [name], MD [facility]  . MULTIPLE EXTRACTIONS WITH ALVEOLOPLASTY  10/25/2011   Procedure: MULTIPLE EXTRACION WITH ALVEOLOPLASTY;  Surgeon: Gae Bon, DDS;  Location: [facility];  Service: Oral Surgery;  Laterality: Bilateral;  Extract teeth numbers one, two, six, seven, eight, nine, ten, eleven, twelve, fifteen,  sixteen, eighteen, nineteen, twenty-three, twenty-four, twenty-five, twenty-seven, thirty-two and alveoplasty.   Social History:  reports that she has never smoked. She has never used smokeless tobacco. She reports that she drinks about 0.6 oz of alcohol per week . She reports that she uses drugs, including Cocaine, about 1 time per week.  Family / Support Systems Marital Status: Single Patient Roles: Partner, Caregiver Spouse/Significant Other: Deborah Wise-boyfriend [id number] Children: Tony-son [phone] Other Supports: Azucena Freed Anticipated Caregiver: Deborah Moores Ability/Limitations of Caregiver: None Caregiver Availability: 24/7 Family Dynamics: Been with Deborah Moores many years. She was assisting with her parent's care-providing meals and assisting in the bed at night. They do have private duty caregivers to assist them. They are managing ok with pt here.   Social History Preferred language: English Religion: Holiness Cultural Background: No issues Education: High School Read: Yes Write: Yes Employment Status: Disabled Freight forwarder Issues: No issues Guardian/Conservator: None-according to MD pt is capable of making her own decisions while here   Abuse/Neglect Physical Abuse: Denies Verbal Abuse: Denies Sexual Abuse: Denies Exploitation of patient/patient's resources: Denies Self-Neglect: Denies  Emotional Status Pt's affect, behavior adn adjustment status: Pt is motivated and realizes she needs to change her habits so this will not happen again. She is well aware she needs to stop drinking and her drug abuse. She certainly does not want this to happen again. Her Mom has had two strokes and wheelchair bound as a result of this. Recent Psychosocial Issues: other health issues-was not taking good care of herself prior to this admission and she  is aware this needs to change. Pyschiatric History: No history deferred depression screen due to pt doing  well, would benefit from seeing neuro-psych while here due to substance abuse issues and for coping with her stroke. Substance Abuse History: ETOH and Positive for Cocaine when admitted-pt ready to change her ways and Deborah Moores does not do any of this. Will discuss resources available for her to follow up with at discharge  Patient / Family Perceptions, Expectations & Goals Pt/Family understanding of illness & functional limitations: Pt and Deborah Moores have a good understanding of her stroke and deficits. She has spoken with the MD and feels her questions and concerns are being addressed. She is pleased with the progress she has made already in therapies. Premorbid pt/family roles/activities: Girlfriend, Mother, caregiver, retiree, sibling, etc Anticipated changes in roles/activities/participation: resume Pt/family expectations/goals: Pt states: " I want to be able to take care of myself when I leave here."  Deborah Moores states: " I will help her she is going home with me." He is also here to observe in therapies with her.  Community Resources Express Scripts: None Premorbid Home Care/DME Agencies: None Transportation available at discharge: Uses the bus Resource referrals recommended: Neuropsychology, Support group (specify)  Discharge Planning Living Arrangements: Alone Support Systems: Spouse/significant other, Parent, Other relatives, Friends/neighbors Type of Residence: Private residence Insurance Resources: Medicaid (specify county) Sports coach Co) Museum/gallery curator Resources: SSI Financial Screen Referred: Previously completed Living Expenses: Education officer, community Management: Patient Does the patient have any problems obtaining your medications?: Yes (Describe) (co-pay is low-only $3.00 ) Home Management: Patient Patient/Family Preliminary Plans: Plans to go to Thomas's home where he will be there to assist her if necessary. Her parent's are getting along ok and do have hired caregivers to assist them. Pt realizes  she needs to stop drinking and drugging and plans to do this. This has scared her and given her a wake up calls to change her lifestyle habits. Social Work Anticipated Follow Up Needs: HH/OP, Support Group  Clinical Impression Pleasant female who is making good progress since her stroke. She is aware she will need to change her lifestyle habits, so to prevent having another stroke. She is worried about her parents, but is being reassured they Are doing well and to focus on her recovery. Her boyfriend-Thomas is very supportive and willing to assist her and will help her quitting drugs. Will make neuro-psych referral and have seen while here. Pt will be a short length Of stay due to high level.  Elease Hashimoto 07/05/2016, 1:25 PM

## 2016-07-05 NOTE — Progress Notes (Signed)
Patient information reviewed and entered into eRehab system by Marie Noel, RN, CRRN, PPS Coordinator.  Information including medical coding and functional independence measure will be reviewed and updated through discharge.    

## 2016-07-06 ENCOUNTER — Inpatient Hospital Stay (HOSPITAL_COMMUNITY): Payer: Medicaid Other | Admitting: Physical Therapy

## 2016-07-06 ENCOUNTER — Inpatient Hospital Stay (HOSPITAL_COMMUNITY): Payer: Medicaid Other | Admitting: Occupational Therapy

## 2016-07-06 ENCOUNTER — Inpatient Hospital Stay (HOSPITAL_COMMUNITY): Payer: Medicaid Other | Admitting: Speech Pathology

## 2016-07-06 LAB — URINE CULTURE

## 2016-07-06 NOTE — Progress Notes (Signed)
Physical Therapy Session Note  Patient Details  Name: Deborah Wise MRN: [id number] Date of Birth: 07/23/1950  Today's Date: 07/06/2016 PT Individual Time: 1100-1159 PT Individual Time Calculation (min): 59 min   Short Term Goals: Week 1:  PT Short Term Goal 1 (Week 1): STG=LTG due to ELOS  Skilled Therapeutic Interventions/Progress Updates:  No c/o pain.  Session focus on activity tolerance, balance, ambulation, and NMR via forced used, functional mobility, and multimodal cues.    Pt ambulates throughout unit with supervision, max distance 300', with occasional staggering gait pattern but no significant LOB.  Gait training through obstacle course for balance and anticipatory postural adjustments focus on stepping over and around obstacles and ambulation on compliant surfaces.  Progress to completing obstacle course with additional focus of visual scanning to R to locate red HEP handouts posted and completing each exercise 10 reps bilat (minisquats, hip abd, hamstring curls, LAQ, and heel/toe raises).  Nustep x10 minutes at level 5 with 4 extremities for reciprocal stepping pattern retraining and overall activity tolerance.  PT instructed pt in floor transfer for falls recovery and pt requires mod assist to come from side sitting to quadruped position, but able to complete all other parts of transfer with supervision.  Discussed implications for falls at home and pt verbalized understanding.  Table top activity focus on RUE functional use and activity tolerance with fine motor skills.  Pt returned to room at end of session and positioned in bed with call bell in reach and needs met.      Therapy Documentation Precautions:  Precautions Precautions: Fall Restrictions Weight Bearing Restrictions: No   See Function Navigator for Current Functional Status.   Therapy/Group: Individual Therapy  Caitlin E Penven-Crew 07/06/2016, 11:20 AM  

## 2016-07-06 NOTE — Progress Notes (Signed)
Occupational Therapy Session Note  Patient Details  Name: Deborah Wise MRN: [id number] Date of Birth: May 17, 1951  Today's Date: 07/06/2016 OT Individual Time: [id number] and 1330-1408 OT Individual Time Calculation (min): 60 min and 38 min (missed 7 mins due to fatigue)   Short Term Goals: Week 1:  OT Short Term Goal 1 (Week 1): STG=LTG d/t short anticipated LOS  Skilled Therapeutic Interventions/Progress Updates:    1) Treatment session with focus on functional mobility and increased safety with self-care tasks.  Pt received seated at EOB finishing breakfast.  Pt utilized RUE as dominant level with mild increased time, able to open containers with BUE.  Completed bathing at distant supervision level in room shower at sit > stand level.  Pt completed toileting and dressing with overall distant supervision. Ambulated to ADL apt without AD and no LOB.  Completed shower transfers stepping over tub ledge with use of grab bar.  Discussed purchasing grab bar for increased stability with transfers in/out of shower, with discussion regarding pros and cons of suction cup vs installed grab bars.  Discussed meal prep activity for afternoon session with pt reporting her SO will do majority of cooking post d/c.    2) Treatment session with focus on safety in home environment.  Pt resting upon arrival and reporting fatigue but willing to participate in treatment session.  Pt called dietary to place meal order prior to leaving room and had to repeat herself 25-30% of the time, but when she would slow down her speech was more intelligible.  Ambulated to ADL apt, engaged in tub/shower transfer as during AM with boyfriend present to observe and engage in discussion regarding recommended grab bar.  Engaged in simple meal prep with frying an egg with pt retrieving items from cabinets, cooking, and then washing dishes all at supervision - Mod I level.  Discussed energy conservation strategies as well as increased  incorporation of RUE into functional tasks, as pt did utilize RUE when stirring the egg and washing dishes, but not when placing items into overhead cabinet.  Pt reports fatigue and requesting to return to room early.  Left in room with family present.  Therapy Documentation Precautions:  Precautions Precautions: Fall Restrictions Weight Bearing Restrictions: No GPain: Pain Assessment Pain Assessment: 0-10 Pain Score: 3  Pain Location: Head Pain Orientation: Right Pain Descriptors / Indicators: Headache Pain Intervention(s): Medication (See eMAR) ADL: ADL ADL Comments: Please see functional navigator for ADL status  See Function Navigator for Current Functional Status.   Therapy/Group: Individual Therapy  HOXIE, [place] 07/06/2016, 10:03 AM

## 2016-07-06 NOTE — Progress Notes (Signed)
Speech Language Pathology Daily Session Note  Patient Details  Name: Deborah Wise MRN: [id number] Date of Birth: 1951/01/11  Today's Date: 07/06/2016 SLP Individual Time: [id number] SLP Individual Time Calculation (min): 25 min  Short Term Goals: Week 1: SLP Short Term Goal 1 (Week 1): Pt will utilize speech intelligibility strategies at the phrase level with Mod A verbal cues to achieve 75% intelligibility.  SLP Short Term Goal 2 (Week 1): Pt will demonstrate self-monitoring and correction for use of compensatory strategies with speech intelligibility at the phrase level with Mod A verbal cues.  SLP Short Term Goal 3 (Week 1): Pt will recall speech intelligibility strategies with Min A verbal cues.   Skilled Therapeutic Interventions: Skilled treatment session focused on speech goals. SLP facilitated session by providing Min A verbal cues for utilization of speech intelligibility strategies to achieve ~75% intelligibility at the sentence level during a structured barrier task. Patient demonstrated overall increased speech intelligibility this session with increased attempts to self-monitor and correct errors. Patient left upright in chair with caregiver present. Continue with current plan of care.    Function:  Cognition Comprehension Comprehension assist level: Follows basic conversation/direction with extra time/assistive device  Expression   Expression assist level: Expresses basic 75 - 89% of the time/requires cueing 10 - 24% of the time. Needs helper to occlude trach/needs to repeat words.  Social Interaction Social Interaction assist level: Interacts appropriately 75 - 89% of the time - Needs redirection for appropriate language or to initiate interaction.  Problem Solving Problem solving assist level: Solves basic problems with no assist  Memory Memory assist level: More than reasonable amount of time    Pain No/Denies Pain   Therapy/Group: Individual Therapy  PAYNE,  COURTNEY 07/06/2016, 12:34 PM

## 2016-07-06 NOTE — Progress Notes (Signed)
Subjective/Complaints: Still has some burning with urination. No sweats or chills. Requiring supervision with ADLs at the current time according to OT, who is in with the patient this morning.  Review of systems negative for nausea, vomiting, diarrhea, constipation, bowel or bladder incontinence, chest pains or shortness of breath Objective: Vital Signs: Blood pressure (!) 137/58, pulse 72, temperature 98.8 F (37.1 C), temperature source Oral, resp. rate 18, height 5' 5"  (1.651 m), weight 72.5 kg (159 lb 12.8 oz), SpO2 100 %. No results found. Results for orders placed or performed during the hospital encounter of 07/02/16 (from the past 72 hour(s))  CBC WITH DIFFERENTIAL     Status: Abnormal   Collection Time: 07/05/16  5:37 AM  Result Value Ref Range   WBC 9.7 4.0 - 10.5 K/uL   RBC 4.25 3.87 - 5.11 MIL/uL   Hemoglobin 10.4 (L) 12.0 - 15.0 g/dL   HCT 32.3 (L) 36.0 - 46.0 %   MCV 76.0 (L) 78.0 - 100.0 fL   MCH 24.5 (L) 26.0 - 34.0 pg   MCHC 32.2 30.0 - 36.0 g/dL   RDW 15.3 11.5 - 15.5 %   Platelets 105 (L) 150 - 400 K/uL    Comment: REPEATED TO VERIFY CONSISTENT WITH PREVIOUS RESULT    Neutrophils Relative % 57 %   Lymphocytes Relative 32 %   Monocytes Relative 9 %   Eosinophils Relative 1 %   Basophils Relative 1 %   Neutro Abs 5.5 1.7 - 7.7 K/uL   Lymphs Abs 3.1 0.7 - 4.0 K/uL   Monocytes Absolute 0.9 0.1 - 1.0 K/uL   Eosinophils Absolute 0.1 0.0 - 0.7 K/uL   Basophils Absolute 0.1 0.0 - 0.1 K/uL   RBC Morphology MORPHOLOGY UNREMARKABLE   Comprehensive metabolic panel     Status: Abnormal   Collection Time: 07/05/16  5:37 AM  Result Value Ref Range   Sodium 136 135 - 145 mmol/L   Potassium 4.0 3.5 - 5.1 mmol/L   Chloride 105 101 - 111 mmol/L   CO2 23 22 - 32 mmol/L   Glucose, Bld 115 (H) 65 - 99 mg/dL   BUN 20 6 - 20 mg/dL   Creatinine, Ser 0.78 0.44 - 1.00 mg/dL   Calcium 9.4 8.9 - 10.3 mg/dL   Total Protein 8.1 6.5 - 8.1 g/dL   Albumin 3.0 (L) 3.5 - 5.0 g/dL   AST 82 (H) 15 - 41 U/L   ALT 70 (H) 14 - 54 U/L   Alkaline Phosphatase 95 38 - 126 U/L   Total Bilirubin 0.8 0.3 - 1.2 mg/dL   GFR calc non Af Amer >60 >60 mL/min   GFR calc Af Amer >60 >60 mL/min    Comment: (NOTE) The eGFR has been calculated using the CKD EPI equation. This calculation has not been validated in all clinical situations. eGFR's persistently <60 mL/min signify possible Chronic Kidney Disease.    Anion gap 8 5 - 15  Urinalysis, Routine w reflex microscopic     Status: Abnormal   Collection Time: 07/05/16 11:15 AM  Result Value Ref Range   Color, Urine YELLOW YELLOW   APPearance CLOUDY (A) CLEAR   Specific Gravity, Urine 1.016 1.005 - 1.030   pH 7.0 5.0 - 8.0   Glucose, UA NEGATIVE NEGATIVE mg/dL   Hgb urine dipstick SMALL (A) NEGATIVE   Bilirubin Urine NEGATIVE NEGATIVE   Ketones, ur NEGATIVE NEGATIVE mg/dL   Protein, ur 30 (A) NEGATIVE mg/dL   Nitrite NEGATIVE NEGATIVE  Leukocytes, UA LARGE (A) NEGATIVE   RBC / HPF TOO NUMEROUS TO COUNT 0 - 5 RBC/hpf   WBC, UA TOO NUMEROUS TO COUNT 0 - 5 WBC/hpf   Bacteria, UA RARE (A) NONE SEEN   Squamous Epithelial / LPF 0-5 (A) NONE SEEN   Mucous PRESENT       General: No acute distress Mood and affect are appropriate Heart: Regular rate and rhythm no rubs murmurs or extra sounds Lungs: Clear to auscultation, breathing unlabored, no rales or wheezes Abdomen: Positive bowel sounds, soft nontender to palpation, nondistended Extremities: No clubbing, cyanosis, or edema Skin: No evidence of breakdown, no evidence of rash, left groin healed insertion site from catheterization. No evidence of drainage or erythema Neurologic: Cranial nerves II through XII intact, motor strength is 5/5 in left deltoid, bicep, tricep, grip, hip flexor, knee extensors, ankle dorsiflexor and plantar flexor are 4/5 in the right deltoid, biceps, triceps, grip, hip flexor, knee extensor, ankle dorsiflexor Sensory exam normal sensation to light touch  and proprioception in bilateral upper and lower extremities  Musculoskeletal: Full range of motion in all 4 extremities. No joint swelling   Assessment/Plan: 1. 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 which require 3+ hours per day of interdisciplinary therapy in a comprehensive inpatient rehab setting. Physiatrist is providing close team supervision and 24 hour management of active medical problems listed below. Physiatrist and rehab team continue to assess barriers to discharge/monitor patient progress toward functional and medical goals. FIM: Function - Bathing Position: Shower Body parts bathed by patient: Right arm, Left arm, Chest, Abdomen, Front perineal area, Buttocks, Right upper leg, Left upper leg, Right lower leg, Left lower leg, Back Assist Level: Supervision or verbal cues  Function- Upper Body Dressing/Undressing What is the patient wearing?: Pull over shirt/dress Pull over shirt/dress - Perfomed by patient: Thread/unthread right sleeve, Thread/unthread left sleeve, Put head through opening, Pull shirt over trunk Assist Level: Set up Set up : To obtain clothing/put away Function - Lower Body Dressing/Undressing What is the patient wearing?: Pants, Non-skid slipper socks, Ted Hose Position: Wheelchair/chair at [employer]- Performed by patient: Thread/unthread right pants leg, Thread/unthread left pants leg, Pull pants up/down Non-skid slipper socks- Performed by patient: Don/doff right sock, Don/doff left sock TED Hose - Performed by helper: Don/doff right TED hose, Don/doff left TED hose Assist for footwear: Partial/moderate assist Assist for lower body dressing: Set up Set up : Don/doff TED stockings  Function - Toileting Toileting steps completed by patient: Adjust clothing prior to toileting, Performs perineal hygiene, Adjust clothing after toileting Toileting steps completed by helper: Adjust clothing prior to  toileting, Performs perineal hygiene, Adjust clothing after toileting (per Candice, NT report) Assist level: Touching or steadying assistance (Pt.75%)  Function - Air cabin crew transfer assistive device: Grab bar Assist level to toilet: Touching or steadying assistance (Pt > 75%) Assist level from toilet: Touching or steadying assistance (Pt > 75%)  Function - Chair/bed transfer Chair/bed transfer method: Ambulatory Chair/bed transfer assist level: Supervision or verbal cues Chair/bed transfer details: Verbal cues for precautions/safety, Tactile cues for posture  Function - Locomotion: Wheelchair Will patient use wheelchair at discharge?: No Type: Manual Max wheelchair distance: 16f  Assist Level: Touching or steadying assistance (Pt > 75%) Assist Level: Touching or steadying assistance (Pt > 75%) Wheel 150 feet activity did not occur: Refused Turns around,maneuvers to table,bed, and toilet,negotiates 3% grade,maneuvers on rugs and over doorsills: No Function - Locomotion: Ambulation Assistive device: No device  Max distance: 160 Assist level: Supervision or verbal cues Assist level: Supervision or verbal cues Assist level: Supervision or verbal cues Assist level: Supervision or verbal cues Assist level: Touching or steadying assistance (Pt > 75%)  Function - Comprehension Comprehension: Auditory Comprehension assist level: Follows basic conversation/direction with extra time/assistive device  Function - Expression Expression: Verbal Expression assist level: Expresses basic 75 - 89% of the time/requires cueing 10 - 24% of the time. Needs helper to occlude trach/needs to repeat words.  Function - Social Interaction Social Interaction assist level: Interacts appropriately 75 - 89% of the time - Needs redirection for appropriate language or to initiate interaction.  Function - Problem Solving Problem solving assist level: Solves basic 75 - 89% of the time/requires  cueing 10 - 24% of the time  Function - Memory Memory assist level: More than reasonable amount of time Patient normally able to recall (first 3 days only): Current season, Location of own room, Staff names and faces, That he or she is in a hospital  Medical Problem List and Plan: 1. 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 Continue aspirin therapy -CIR PT, OT, speech, team conference in a.m. 2. DVT Prophylaxis/Anticoagulation: Subcutaneous Lovenox. Monitor platelet counts and any signs of bleeding 3. Pain Management: Tylenol as needed 4. Mood: Provide emotional support 5. Neuropsych: This patient iscapable of making decisions on herown behalf. 6. Skin/Wound Care: Routine skin checks 7. 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 Follow-up outpatient vascular surgery Dr. Trula Slade 6 weeks 9.Hypertension. Norvasc 10 mg daily, HCTZ 25 mg daily. Monitor with increased mobility Vitals:   07/05/16 1352 07/06/16 0450  BP: (!) 146/68 (!) 137/58  Pulse: 80 72  Resp: 19 18  Temp: 98.5 F (36.9 C) 98.8 F (37.1 C)   10.Hyperlipidemia. Lipitor 11.UDS positive for cocaine with history also of alcohol abuse. Provide counseling, neuropsych Check UDS after grounds passes 12.History hepatitis C, ALT, AST mildly elevated, normal. Alkaline phosphatase 13. History of UTI. We'll recheck UA, CNS, given ongoing symptoms, rare bacteria, but increased WBCs and RBCs nitrite is negative. Await culture results, afebrile LOS (Days) 4 A FACE TO FACE EVALUATION WAS PERFORMED  KIRSTEINS,ANDREW E 07/06/2016, 9:32 AM

## 2016-07-07 ENCOUNTER — Inpatient Hospital Stay (HOSPITAL_COMMUNITY): Payer: Medicaid Other | Admitting: Physical Therapy

## 2016-07-07 ENCOUNTER — Inpatient Hospital Stay (HOSPITAL_COMMUNITY): Payer: Medicaid Other | Admitting: Occupational Therapy

## 2016-07-07 DIAGNOSIS — G8191 Hemiplegia, unspecified affecting right dominant side: Secondary | ICD-10-CM

## 2016-07-07 LAB — URINE DRUGS OF ABUSE SCREEN W ALC, ROUTINE (REF LAB)
Amphetamines, Urine: NEGATIVE ng/mL
Barbiturate, Ur: NEGATIVE ng/mL
Benzodiazepine Quant, Ur: NEGATIVE ng/mL
Cannabinoid Quant, Ur: NEGATIVE ng/mL
Cocaine (Metab.): NEGATIVE ng/mL
Ethanol U, Quan: NEGATIVE %
Methadone Screen, Urine: NEGATIVE ng/mL
Opiate Quant, Ur: NEGATIVE ng/mL
Phencyclidine, Ur: NEGATIVE ng/mL
Propoxyphene, Urine: NEGATIVE ng/mL

## 2016-07-07 NOTE — Discharge Summary (Signed)
Discharge summary job # [id number]

## 2016-07-07 NOTE — Progress Notes (Signed)
Speech Language Pathology Session Note & Discharge Summary  Patient Details  Name: Deborah Wise MRN: [id number] Date of Birth: [birth year]  Today's Date: 07/07/2016 SLP Individual Time: 0930-1000 SLP Individual Time Calculation (min): 30 min   Skilled Therapeutic Interventions: Skilled treatment session focused on speech goals. SLP facilitated session by providing Min A verbal cues for patient to utilize speech intelligibility strategies to maximize intelligibility to 75% at the phrase and sentence level during an informal conversation. Patient ambulated to and from room with supervision. Patient left upright in bed with all needs within reach. Continue with current plan of care.     Patient has met 1 of 1 long term goals.  Patient to discharge at overall Supervision level.   Reasons goals not met: N/A   Clinical Impression/Discharge Summary: Patient has made functional gains and has met 1 of 1 LTG's this admission. Currently, patient requires Min A verbal cues for use of speech intelligibility strategies at the sentence level to achieve 75% intelligibility. Patient and caregiver education is complete and patient will discharge home with assistance. Patient would benefit from f/u SLP services to maximize her speech intelligibility and overall functional communication.    Care Partner:  Caregiver Able to Provide Assistance: Yes     Recommendation:  Home Health SLP  Rationale for SLP Follow Up: Maximize functional communication   Equipment: N/A   Reasons for discharge: Treatment goals met;Discharged from hospital   Patient/Family Agrees with Progress Made and Goals Achieved: Yes   Function:   Cognition Comprehension Comprehension assist level: Follows basic conversation/direction with extra time/assistive device  Expression   Expression assist level: Expresses basic 75 - 89% of the time/requires cueing 10 - 24% of the time. Needs helper to occlude trach/needs to repeat words.   Social Interaction Social Interaction assist level: Interacts appropriately 90% of the time - Needs monitoring or encouragement for participation or interaction.  Problem Solving Problem solving assist level: Solves basic problems with no assist  Memory Memory assist level: More than reasonable amount of time   PAYNE, COURTNEY 07/07/2016, 3:38 PM

## 2016-07-07 NOTE — Progress Notes (Signed)
Occupational Therapy Session Note  Patient Details  Name: Deborah Wise MRN: [id number] Date of Birth: August 16, 1950  Today's Date: 07/07/2016 OT Individual Time: [id number] and [id number] OT Individual Time Calculation (min): 60 min and 42 min    Short Term Goals: Week 1:  OT Short Term Goal 1 (Week 1): STG=LTG d/t short anticipated LOS  Skilled Therapeutic Interventions/Progress Updates:    1) Completed ADL retraining at overall Mod I level with pt completing bathing and dressing at sit > stand level in room shower and then dressing from chair in room.  Pt utilized RUE as dominant level with dressing and grooming tasks.  Ambulated to therapy gym without assistance and no LOB.  Engaged in PNF pattern reaching with 2# medicine ball in standing with focus on symmetrical movements and trunk control.  Pt demonstrating mild difficulty with overhead presses with medicine ball, with decreased Rt shoulder ROM, however reports no pain.  Engaged in [place] and grasp activity with crossing midline to grasp resistive clothespins and then place on vertical dowel rod with focus on shoulder ROM, strength, and motor control.  Returned to room and made Mod I in room.  2) Treatment session with focus on RUE strengthening and fine motor coordination.  Pt ambulated to therapy gym without assistance.  Provided pt with orange theraband and handout to increase Rt shoulder ROM and strengthening.  Pt able to complete each exercise with min cues for proper technique.  Completed 9 hole peg test with Lt: 38 seconds and Rt: 1:54 followed by 1:15.  Noted pt to have decreased supination/pronation as well as decreased translation of peg within finger tips.  Pt with improved coordination on 2nd trial, still demonstrating above deficits.  Utilized theraputty to address digit coordination and grasp strength.  Returned to room and pt and SO report no further questions and ready for d/c in AM.  Therapy Documentation Precautions:   Precautions Precautions: Fall Restrictions Weight Bearing Restrictions: No General:   Vital Signs: Therapy Vitals Temp: 98.3 F (36.8 C) Temp Source: Oral Pulse Rate: 63 Resp: 18 BP: (!) 143/72 Patient Position (if appropriate): Lying Oxygen Therapy SpO2: 99 % O2 Device: Not Delivered Pain:  Pt with no c/o pain ADL: ADL ADL Comments: Please see functional navigator for ADL status  See Function Navigator for Current Functional Status.   Therapy/Group: Individual Therapy  Simonne Come 07/07/2016, 8:09 AM

## 2016-07-07 NOTE — Progress Notes (Addendum)
Physical Therapy Discharge Summary  Patient Details  Name: Deborah Wise MRN: [id number] Date of Birth: 08/31/1950  Today's Date: 07/07/2016 PT Individual Time: 1400-1453 PT Individual Time Calculation (min): 53 min    Patient has met 11 of 11 long term goals due to improved activity tolerance, improved balance, improved postural control, increased strength, ability to compensate for deficits, functional use of  left upper extremity and left lower extremity, improved attention, improved awareness and improved coordination.  Patient to discharge at ambulatory level Modified Independent.     Recommendation:  Patient will benefit from ongoing skilled PT services in home health setting to continue to advance safe functional mobility, address ongoing impairments in balance and activity tolerance, and minimize fall risk.  Equipment: No equipment provided  Reasons for discharge: treatment goals met  Patient/family agrees with progress made and goals achieved: Yes   Skilled Therapeutic Intervention: No c/o pain.  Session focus on reassessment of strength, mobility, and balance.  Gait training x300' mod I on compliant and non-compliant surfaces.  Furniture transfers and car transfer mod I.  PT administered Berg Balance Scale and patient demonstrates moderate fall risk as noted by score of 51/56 on Berg Balance Scale.  PT explained results and made recommendations for making safe mobility choices at d/c.  Pt verbalized understanding.  Nustep x10 minutes with 4 extremities and level 4 for reciprocal stepping pattern and overall activity tolerance.  Pt returned to room at end of session.    PT Discharge Precautions/Restrictions Precautions Precautions: Fall Restrictions Weight Bearing Restrictions: No Pain Pain Assessment Pain Assessment: No/denies pain Vision/Perception  Perception Comments: WFL  Cognition Overall Cognitive Status: Within Functional Limits for tasks  assessed Arousal/Alertness: Awake/alert Orientation Level: Oriented X4 Selective Attention: Appears intact Memory: Appears intact Awareness: Appears intact Problem Solving: Appears intact Safety/Judgment: Appears intact Sensation Sensation Light Touch: Appears Intact Coordination Gross Motor Movements are Fluid and Coordinated: Yes Fine Motor Movements are Fluid and Coordinated: Yes Heel Shin Test: normal Motor  Motor Motor: Other (comment) Motor - Discharge Observations: L hemiparesis improving  Mobility Bed Mobility Bed Mobility: Sit to Supine;Supine to Sit Supine to Sit: 6: Modified independent (Device/Increase time) Sit to Supine: 6: Modified independent (Device/Increase time) Transfers Transfers: Yes Sit to Stand: 6: Modified independent (Device/Increase time) Stand to Sit: 6: Modified independent (Device/Increase time) Stand Pivot Transfers: 6: Modified independent (Device/Increase time) Locomotion  Ambulation Ambulation: Yes Ambulation/Gait Assistance: 6: Modified independent (Device/Increase time) Ambulation Distance (Feet): 300 Feet Assistive device: None Gait Gait: Yes Gait Pattern: Lateral hip instability;Trendelenburg;Step-through pattern Gait velocity: within age and gender norms Stairs / Additional Locomotion Stairs: Yes Stairs Assistance: 6: Modified independent (Device/Increase time) Stair Management Technique: One rail Right Number of Stairs: 12 Ramp: 6: Modified independent (Device) Curb: 6: Modified independent (Device/increase time) Product manager Mobility: No  Trunk/Postural Assessment  Cervical Assessment Cervical Assessment: Within Functional Limits Thoracic Assessment Thoracic Assessment: Within Functional Limits Lumbar Assessment Lumbar Assessment: Within Functional Limits Postural Control Postural Control: Within Functional Limits  Balance Balance Balance Assessed: Yes Standardized Balance Assessment Standardized  Balance Assessment: Berg Balance Test Berg Balance Test Sit to Stand: Able to stand without using hands and stabilize independently Standing Unsupported: Able to stand safely 2 minutes Sitting with Back Unsupported but Feet Supported on Floor or Stool: Able to sit safely and securely 2 minutes Stand to Sit: Sits safely with minimal use of hands Transfers: Able to transfer safely, minor use of hands Standing Unsupported with Eyes Closed: Able to stand 10 seconds  safely Standing Ubsupported with Feet Together: Able to place feet together independently and stand 1 minute safely From Standing, Reach Forward with Outstretched Arm: Can reach confidently >25 cm (10") From Standing Position, Pick up Object from Floor: Able to pick up shoe safely and easily From Standing Position, Turn to Look Behind Over each Shoulder: Looks behind one side only/other side shows less weight shift Turn 360 Degrees: Able to turn 360 degrees safely one side only in 4 seconds or less Standing Unsupported, Alternately Place Feet on Step/Stool: Able to stand independently and complete 8 steps >20 seconds Standing Unsupported, One Foot in Front: Able to plae foot ahead of the other independently and hold 30 seconds Standing on One Leg: Able to lift leg independently and hold 5-10 seconds Total Score: 51 Static Sitting Balance Static Sitting - Balance Support: No upper extremity supported Static Sitting - Level of Assistance: 7: Independent Dynamic Sitting Balance Dynamic Sitting - Balance Support: No upper extremity supported Dynamic Sitting - Level of Assistance: 6: Modified independent (Device/Increase time) Static Standing Balance Static Standing - Balance Support: No upper extremity supported Static Standing - Level of Assistance: 6: Modified independent (Device/Increase time) Dynamic Standing Balance Dynamic Standing - Balance Support: No upper extremity supported;During functional activity Dynamic Standing - Level  of Assistance: 6: Modified independent (Device/Increase time) Extremity Assessment      RLE Assessment RLE Assessment: Within Functional Limits LLE Assessment LLE Assessment: Within Functional Limits   See Function Navigator for Current Functional Status.  Caitlin E Penven-Crew 07/07/2016, 2:37 PM

## 2016-07-07 NOTE — Patient Care Conference (Signed)
Inpatient RehabilitationTeam Conference and Plan of Care Update Date: 07/07/2016   Time: 11:15 AM    Patient Name: Deborah Wise      Medical Record Number: [id number]  Date of Birth: 1951/05/05 Sex: Female         Room/Bed: 4W05C/4W05C-01 Payor Info: Payor: MEDICAID [place] / Plan: MEDICAID [place] ACCESS / Product Type: *No Product type* /    Admitting Diagnosis: CVA  Admit Date/Time:  07/02/2016  2:17 PM Admission Comments: No comment available   Primary Diagnosis:  Left pontine cerebrovascular accident [facility]) Principal Problem: Left pontine cerebrovascular accident [facility])  Patient Active Problem List   Diagnosis Date Noted  . 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[place]) 07/02/2016  . Aneurysm of middle cerebral artery   . Cerebrovascular accident (CVA) due to thrombosis of left vertebral artery ([place]) 06/28/2016  . Cocaine abuse   . Stroke (cerebrum) ([place]) 06/27/2016  . Microcytic anemia 12/27/2014  . Thrombocytopenia ([place]) 12/25/2014  . Hypoglycemia 07/21/2013  . Diabetes mellitus ([place]) 07/21/2013  . Essential hypertension 07/21/2013  . Liver disease 07/21/2013  . Alcohol abuse 07/21/2013    Expected Discharge Date: Expected Discharge Date: 07/08/16  Team Members Present: Physician leading conference: Dr. Alysia Penna Social Worker Present: Ovidio Kin, LCSW Nurse Present: Dorien Chihuahua, RN PT Present: Dwyane Dee, PT OT Present: Simonne Come, OT SLP Present: Weston Anna, SLP PPS Coordinator present : Daiva Nakayama, RN, CRRN     Current Status/Progress Goal Weekly Team Focus  Medical   (P) No pain issues, some hematuria which is clearing UCx indeterminate  (P) Urinary continence, monitor hematuria  (P) recheck CBC   Bowel/Bladder        Cont B & B     Swallow/Nutrition/ Hydration        Regular diet     ADL's   supervision overall  Mod I overall, supervision shower transfers and meal prep  ADL retraining, activity tolerance, high level balance, RUE NMR    Mobility   mod I  mod I  d/c planning, grad day Wednesday with d/c Thursday   Communication   Min A  Min A  Utilization of speech intelligibility strategies, education    Safety/Cognition/ Behavioral Observations            Pain        no issues     Skin        no issues        *See Care Plan and progress notes for long and short-term goals.  Barriers to Discharge:   medical stability   Possible Resolutions to Barriers:    none   Discharge Planning/Teaching Needs:  Home to boyfriend's home where he can provide supervision, he has been here to observe in therapies daily. Addressing her substance abuse issues.      Team Discussion:  Meeting goals of mod/i level-some supervision with tub transfers and meal prep. Burning with urination-MD aware and checking UA. Medically stable for discharge tomorrow. Will have follow up home health therapies. Needs occasional tylenol for a headache  Revisions to Treatment Plan:  DC tommorrow      Elease Hashimoto 07/07/2016, 4:28 PM

## 2016-07-07 NOTE — Progress Notes (Signed)
Subjective/Complaints: Burning and itching with urination is subsiding. Last bowel movement yesterday. Patient has also noted some blood on wiping after urinating but this has subsided as well  Review of systems negative for nausea, vomiting, diarrhea, constipation, bowel or bladder incontinence, chest pains or shortness of breath Objective: Vital Signs: Blood pressure (!) 144/92, pulse 63, temperature 98.3 F (36.8 C), temperature source Oral, resp. rate 18, height _0  (1.651 m), weight 71.3 kg (157 lb 3 oz), SpO2 99 %. No results found. Results for orders placed or performed during the hospital encounter of 07/02/16 (from the past 72 hour(s))  CBC WITH DIFFERENTIAL     Status: Abnormal   Collection Time: 07/05/16  5:37 AM  Result Value Ref Range   WBC 9.7 4.0 - 10.5 K/uL   RBC 4.25 3.87 - 5.11 MIL/uL   Hemoglobin 10.4 (L) 12.0 - 15.0 g/dL   HCT 32.3 (L) 36.0 - 46.0 %   MCV 76.0 (L) 78.0 - 100.0 fL   MCH 24.5 (L) 26.0 - 34.0 pg   MCHC 32.2 30.0 - 36.0 g/dL   RDW 15.3 11.5 - 15.5 %   Platelets 105 (L) 150 - 400 K/uL    Comment: REPEATED TO VERIFY CONSISTENT WITH PREVIOUS RESULT    Neutrophils Relative % 57 %   Lymphocytes Relative 32 %   Monocytes Relative 9 %   Eosinophils Relative 1 %   Basophils Relative 1 %   Neutro Abs 5.5 1.7 - 7.7 K/uL   Lymphs Abs 3.1 0.7 - 4.0 K/uL   Monocytes Absolute 0.9 0.1 - 1.0 K/uL   Eosinophils Absolute 0.1 0.0 - 0.7 K/uL   Basophils Absolute 0.1 0.0 - 0.1 K/uL   RBC Morphology MORPHOLOGY UNREMARKABLE   Comprehensive metabolic panel     Status: Abnormal   Collection Time: 07/05/16  5:37 AM  Result Value Ref Range   Sodium 136 135 - 145 mmol/L   Potassium 4.0 3.5 - 5.1 mmol/L   Chloride 105 101 - 111 mmol/L   CO2 23 22 - 32 mmol/L   Glucose, Bld 115 (H) 65 - 99 mg/dL   BUN 20 6 - 20 mg/dL   Creatinine, Ser 0.78 0.44 - 1.00 mg/dL   Calcium 9.4 8.9 - 10.3 mg/dL   Total Protein 8.1 6.5 - 8.1 g/dL   Albumin 3.0 (L) 3.5 - 5.0 g/dL   AST  82 (H) 15 - 41 U/L   ALT 70 (H) 14 - 54 U/L   Alkaline Phosphatase 95 38 - 126 U/L   Total Bilirubin 0.8 0.3 - 1.2 mg/dL   GFR calc non Af Amer >60 >60 mL/min   GFR calc Af Amer >60 >60 mL/min    Comment: (NOTE) The eGFR has been calculated using the CKD EPI equation. This calculation has not been validated in all clinical situations. eGFR's persistently <60 mL/min signify possible Chronic Kidney Disease.    Anion gap 8 5 - 15  Urine culture     Status: Abnormal   Collection Time: 07/05/16 11:15 AM  Result Value Ref Range   Specimen Description URINE, RANDOM    Special Requests NONE    Culture MULTIPLE SPECIES PRESENT, SUGGEST RECOLLECTION (A)    Report Status 07/06/2016 FINAL   Urinalysis, Routine w reflex microscopic     Status: Abnormal   Collection Time: 07/05/16 11:15 AM  Result Value Ref Range   Color, Urine YELLOW YELLOW   APPearance CLOUDY (A) CLEAR   Specific Gravity, Urine 1.016 1.005 -  1.030   pH 7.0 5.0 - 8.0   Glucose, UA NEGATIVE NEGATIVE mg/dL   Hgb urine dipstick SMALL (A) NEGATIVE   Bilirubin Urine NEGATIVE NEGATIVE   Ketones, ur NEGATIVE NEGATIVE mg/dL   Protein, ur 30 (A) NEGATIVE mg/dL   Nitrite NEGATIVE NEGATIVE   Leukocytes, UA LARGE (A) NEGATIVE   RBC / HPF TOO NUMEROUS TO COUNT 0 - 5 RBC/hpf   WBC, UA TOO NUMEROUS TO COUNT 0 - 5 WBC/hpf   Bacteria, UA RARE (A) NONE SEEN   Squamous Epithelial / LPF 0-5 (A) NONE SEEN   Mucous PRESENT       General: No acute distress Mood and affect are appropriate Heart: Regular rate and rhythm no rubs murmurs or extra sounds Lungs: Clear to auscultation, breathing unlabored, no rales or wheezes Abdomen: Positive bowel sounds, soft nontender to palpation, nondistended Extremities: No clubbing, cyanosis, or edema Skin: No evidence of breakdown, no evidence of rash, left groin healed insertion site from catheterization. No evidence of drainage or erythema Neurologic: Cranial nerves II through XII intact, motor  strength is 5/5 in left deltoid, bicep, tricep, grip, hip flexor, knee extensors, ankle dorsiflexor and plantar flexor are 4/5 in the right deltoid, biceps, triceps, grip, hip flexor, knee extensor, ankle dorsiflexor Sensory exam normal sensation to light touch and proprioception in bilateral upper and lower extremities  Musculoskeletal: Full range of motion in all 4 extremities. No joint swelling   Assessment/Plan: 1. 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 which require 3+ hours per day of interdisciplinary therapy in a comprehensive inpatient rehab setting. Physiatrist is providing close team supervision and 24 hour management of active medical problems listed below. Physiatrist and rehab team continue to assess barriers to discharge/monitor patient progress toward functional and medical goals. FIM: Function - Bathing Position: Shower Body parts bathed by patient: Right arm, Left arm, Chest, Abdomen, Front perineal area, Buttocks, Right upper leg, Left upper leg, Right lower leg, Left lower leg, Back Assist Level: More than reasonable time  Function- Upper Body Dressing/Undressing What is the patient wearing?: Pull over shirt/dress Pull over shirt/dress - Perfomed by patient: Thread/unthread right sleeve, Thread/unthread left sleeve, Put head through opening, Pull shirt over trunk Assist Level: More than reasonable time Set up : To obtain clothing/put away Function - Lower Body Dressing/Undressing What is the patient wearing?: Pants, Non-skid slipper socks Position:  (chair) Pants- Performed by patient: Thread/unthread right pants leg, Thread/unthread left pants leg, Pull pants up/down Non-skid slipper socks- Performed by patient: Don/doff right sock, Don/doff left sock TED Hose - Performed by helper: Don/doff right TED hose, Don/doff left TED hose Assist for footwear: Independent Assist for lower body dressing: More than reasonable  time Set up : Don/doff TED stockings  Function - Toileting Toileting steps completed by patient: Adjust clothing prior to toileting, Performs perineal hygiene, Adjust clothing after toileting Toileting steps completed by helper: Adjust clothing prior to toileting, Performs perineal hygiene, Adjust clothing after toileting (per Candice, NT report) Toileting Assistive Devices: Grab bar or rail Assist level: More than reasonable time  Function Midwife transfer assistive device: Grab bar Assist level to toilet: No Help, no cues, assistive device, takes more than a reasonable amount of time Assist level from toilet: No Help, no cues, assistive device, takes more than a reasonable amount of time  Function - Chair/bed transfer Chair/bed transfer method: Ambulatory Chair/bed transfer assist level: No Help, no cues, assistive device, takes more than a reasonable  amount of time Chair/bed transfer details: Verbal cues for precautions/safety, Tactile cues for posture  Function - Locomotion: Wheelchair Will patient use wheelchair at discharge?: No Type: Manual Max wheelchair distance: 22f  Assist Level: Touching or steadying assistance (Pt > 75%) Assist Level: Touching or steadying assistance (Pt > 75%) Wheel 150 feet activity did not occur: Refused Turns around,maneuvers to table,bed, and toilet,negotiates 3% grade,maneuvers on rugs and over doorsills: No Function - Locomotion: Ambulation Assistive device: No device Max distance: 160 Assist level: Supervision or verbal cues Assist level: Supervision or verbal cues Assist level: Supervision or verbal cues Assist level: Supervision or verbal cues Assist level: Supervision or verbal cues  Function - Comprehension Comprehension: Auditory Comprehension assist level: Follows basic conversation/direction with extra time/assistive device  Function - Expression Expression: Verbal Expression assist level: Expresses basic 75 -  89% of the time/requires cueing 10 - 24% of the time. Needs helper to occlude trach/needs to repeat words.  Function - Social Interaction Social Interaction assist level: Interacts appropriately 75 - 89% of the time - Needs redirection for appropriate language or to initiate interaction.  Function - Problem Solving Problem solving assist level: Solves basic problems with no assist  Function - Memory Memory assist level: More than reasonable amount of time Patient normally able to recall (first 3 days only): Current season, Location of own room, Staff names and faces, That he or she is in a hospital  Medical Problem List and Plan: 1. 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 Continue aspirin therapy -CIR PT, OT, speech,Team conference today please see physician documentation under team conference tab, met with team face-to-face to discuss problems,progress, and goals. Formulized individual treatment plan based on medical history, underlying problem and comorbidities. 2. DVT Prophylaxis/Anticoagulation: Subcutaneous Lovenox. Monitor platelet counts and any signs of bleeding 3. Pain Management: Tylenol as needed 4. Mood: Provide emotional support 5. Neuropsych: This patient iscapable of making decisions on herown behalf. 6. Skin/Wound Care: Routine skin checks 7. 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 Follow-up outpatient vascular surgery Dr. BTrula Slade6 weeks 9.Hypertension. Norvasc 10 mg daily, HCTZ 25 mg daily. Monitor with increased mobility Vitals:   07/07/16 0542 07/07/16 0902  BP: (!) 143/72 (!) 144/92  Pulse: 63   Resp: 18   Temp: 98.3 F (36.8 C)    10.Hyperlipidemia. Lipitor 11.UDS positive for cocaine with history also of alcohol abuse. Provide counseling, neuropsych Check UDS after grounds passes, results  pending, no controlled substances on med list 12.History hepatitis C, ALT, AST mildly elevated, normal. Alkaline phosphatase 13. History of UTI. We'll recheck UA, CNS, given ongoing symptoms, rare bacteria, but increased WBCs and RBCs nitrite is negative. Culture results: Reviewing multiple species. Given overall improvement in symptoms, will hold off on recall flexion. Otherwise, would need a cath specimen LOS (Days) 5 [place] 07/07/2016, 9:21 AM

## 2016-07-07 NOTE — Progress Notes (Signed)
Physical Therapy Session Note  Patient Details  Name: Deborah Wise MRN: [id number] Date of Birth: 04-15-1951  Today's Date: 07/07/2016 PT Individual Time: 1000-1030 PT Individual Time Calculation (min): 30 min   Short Term Goals: Week 1:  PT Short Term Goal 1 (Week 1): STG=LTG due to ELOS  Skilled Therapeutic Interventions/Progress Updates:    no c/o pain, agreeable to session focus on NMR and balance with high level activities.    Pt ambulates to and from therapy gym mod I.  Transfers throughout session mod I.  Pt engaged in throwing/catching weighted ball with and without LE support focus on trunk control and anticipatory postural reactions.  Progress to throwing and catching unweighted ball while sitting on therapy ball with initial min assist for balance fade to close supervision.    Pt ambulates back to room at end of session and left upright in chair.  Call bell in reach and needs met.    Therapy Documentation Precautions:  Precautions Precautions: Fall Restrictions Weight Bearing Restrictions: No   See Function Navigator for Current Functional Status.   Therapy/Group: Individual Therapy  Caitlin E Penven-Crew 07/07/2016, 11:29 AM

## 2016-07-07 NOTE — Progress Notes (Signed)
Social Work Patient ID: Deborah Wise, female   DOB: [birth year], 66 y.o.   MRN: [id number]  Met with pt and boyfriend to discuss team conference progress and goals mod/-supervision level and discharge 2/1. Pt has done very well and reached her goals quickly. Boyfriend has been here daily and participated in therapies with pt. Agreeable to home health therapies and no equipment needed. Pt has completed SCAT application and will fax In. Feels she does not need assist with her substance abuse issues feels he can do this on her own.

## 2016-07-07 NOTE — Progress Notes (Signed)
Occupational Therapy Discharge Summary  Patient Details  Name: Deborah Wise MRN: [id number] Date of Birth: 1951/02/14  Patient has met 13 of 11 long term goals due to improved activity tolerance, improved balance, postural control, ability to compensate for deficits, functional use of  RIGHT upper and RIGHT lower extremity, improved awareness and improved coordination.  Patient to discharge at overall Modified Independent level, supervision tub/shower transfers and meal prep.  Patient's care partner is independent to provide the necessary physical and cognitive assistance at discharge.    Reasons goals not met: N/A  Recommendation:  Patient will benefit from ongoing skilled OT services in home health setting to continue to advance functional skills in the area of BADL and Reduce care partner burden.  Equipment: No equipment provided  Reasons for discharge: treatment goals met and discharge from hospital  Patient/family agrees with progress made and goals achieved: Yes  OT Discharge Precautions/Restrictions  Precautions Precautions: Fall Restrictions Weight Bearing Restrictions: No Pain Pain Assessment Pain Assessment: No/denies pain ADL ADL ADL Comments: Please see functional navigator for ADL status - overall Mod I with exception of Supervision with tub/shower transfers Vision/Perception  Vision- History Baseline Vision/History: No visual deficits Patient Visual Report: No change from baseline Vision- Assessment Vision Assessment?: No apparent visual deficits;Yes Perception Comments: WFL  Cognition Overall Cognitive Status: Within Functional Limits for tasks assessed Arousal/Alertness: Awake/alert Orientation Level: Oriented X4 Selective Attention: Appears intact Memory: Appears intact Awareness: Appears intact Problem Solving: Appears intact Safety/Judgment: Appears intact Sensation Sensation Light Touch: Appears Intact Stereognosis: Not tested Hot/Cold:  Appears Intact Proprioception: Appears Intact Coordination Gross Motor Movements are Fluid and Coordinated: Yes Fine Motor Movements are Fluid and Coordinated: No Coordination and Movement Description: decreased R UE strength/coordination  Finger Nose Finger Test: WNL Heel Shin Test: normal 9 Hole Peg Test: Lt: 30 seconds, Rt: 1:54 and then 1:15 Motor  Motor Motor: Other (comment) Motor - Discharge Observations: L hemiparesis improving Mobility  Bed Mobility Bed Mobility: Sit to Supine;Supine to Sit Supine to Sit: 6: Modified independent (Device/Increase time) Sit to Supine: 6: Modified independent (Device/Increase time) Transfers Sit to Stand: 6: Modified independent (Device/Increase time) Stand to Sit: 6: Modified independent (Device/Increase time)  Trunk/Postural Assessment  Cervical Assessment Cervical Assessment: Within Functional Limits Thoracic Assessment Thoracic Assessment: Within Functional Limits Lumbar Assessment Lumbar Assessment: Within Functional Limits Postural Control Postural Control: Within Functional Limits  Balance Balance Balance Assessed: Yes Standardized Balance Assessment Standardized Balance Assessment: Berg Balance Test Berg Balance Test Sit to Stand: Able to stand without using hands and stabilize independently Standing Unsupported: Able to stand safely 2 minutes Sitting with Back Unsupported but Feet Supported on Floor or Stool: Able to sit safely and securely 2 minutes Stand to Sit: Sits safely with minimal use of hands Transfers: Able to transfer safely, minor use of hands Standing Unsupported with Eyes Closed: Able to stand 10 seconds safely Standing Ubsupported with Feet Together: Able to place feet together independently and stand 1 minute safely From Standing, Reach Forward with Outstretched Arm: Can reach confidently >25 cm (10") From Standing Position, Pick up Object from Floor: Able to pick up shoe safely and easily From Standing  Position, Turn to Look Behind Over each Shoulder: Looks behind one side only/other side shows less weight shift Turn 360 Degrees: Able to turn 360 degrees safely one side only in 4 seconds or less Standing Unsupported, Alternately Place Feet on Step/Stool: Able to stand independently and complete 8 steps >20 seconds Standing Unsupported, One Foot in Front:  Able to plae foot ahead of the other independently and hold 30 seconds Standing on One Leg: Able to lift leg independently and hold 5-10 seconds Total Score: 51 Static Sitting Balance Static Sitting - Balance Support: No upper extremity supported Static Sitting - Level of Assistance: 7: Independent Dynamic Sitting Balance Dynamic Sitting - Balance Support: No upper extremity supported Dynamic Sitting - Level of Assistance: 6: Modified independent (Device/Increase time) Static Standing Balance Static Standing - Balance Support: No upper extremity supported Static Standing - Level of Assistance: 6: Modified independent (Device/Increase time) Dynamic Standing Balance Dynamic Standing - Balance Support: No upper extremity supported;During functional activity Dynamic Standing - Level of Assistance: 6: Modified independent (Device/Increase time) Extremity/Trunk Assessment RUE Assessment RUE Assessment: Exceptions to [facility] RUE AROM (degrees) Overall AROM Right Upper Extremity: Deficits RUE Overall AROM Comments: shoulder flexion limited to grossly 120 with decreased ability to reach overhead for combing hair, overall WFL for grooming and self-care tasks RUE Strength RUE Overall Strength Comments: strength grossly 3+/5 LUE Assessment LUE Assessment: Within Functional Limits (strength grossly 4/5)   See Function Navigator for Current Functional Status.  Simonne Come 07/07/2016, 3:28 PM

## 2016-07-07 NOTE — Discharge Instructions (Signed)
Inpatient Rehab Discharge Instructions  Deborah Wise Discharge date and time: No discharge date for patient encounter.   Activities/Precautions/ Functional Status: Activity: activity as tolerated Diet: regular diet Wound Care: none needed Functional status:  ___ No restrictions     ___ Walk up steps independently ___ 24/7 supervision/assistance   ___ Walk up steps with assistance ___ Intermittent supervision/assistance  ___ Bathe/dress independently ___ Walk with walker     ___ Bathe/dress with assistance ___ Walk Independently    ___ Shower independently ___ Walk with assistance    ___ Shower with assistance ___ No alcohol     ___ Return to work/school ________  Special Instructions:    COMMUNITY REFERRALS UPON DISCHARGE:    Home Health:   PT, OT, [place]   Date of last service:07/08/2016  Medical Equipment/Items Ordered:NO NEEDS  Other:SCAT APPLICATION  GENERAL COMMUNITY RESOURCES FOR PATIENT/FAMILY: Support Groups:[facility] SUPPORT GROUP EVERY SECOND Thursday @ 3:00-4:00 PM ON THE REHAB UNIT QUESTIONS CONTACT CAITLYN [id number]   My questions have been answered and I understand these instructions. I will adhere to these goals and the provided educational materials after my discharge from the hospital.  Patient/Caregiver Signature _______________________________ Date __________  Clinician Signature _______________________________________ Date __________  Please bring this form and your medication list with you to all your follow-up doctor's appointments. Inpatient Rehab Discharge Instructions  Deborah Wise Discharge date and time: No discharge date for patient encounter.   Activities/Precautions/ Functional Status: Activity: activity as tolerated Diet: regular diet Wound Care: None needed Functional status:  ___ No restrictions     ___ Walk up steps independently ___ 24/7 supervision/assistance   ___ Walk up steps with  assistance ___ Intermittent supervision/assistance  ___ Bathe/dress independently ___ Walk with walker     _x__ Bathe/dress with assistance ___ Walk Independently    ___ Shower independently ___ Walk with assistance    ___ Shower with assistance ___ No alcohol     ___ Return to work/school ________  Special Instructions: No driving  No smoking/ STROKE/TIA DISCHARGE INSTRUCTIONS SMOKING Cigarette smoking nearly doubles your risk of having a stroke & is the single most alterable risk factor  If you smoke or have smoked in the last 12 months, you are advised to quit smoking for your health.  Most of the excess cardiovascular risk related to smoking disappears within a year of stopping.  Ask you doctor about anti-smoking medications  [place] Quit Line: 1-800-QUIT NOW  Free Smoking Cessation Classes [phone]  CHOLESTEROL Know your levels; limit fat & cholesterol in your diet  Lipid Panel     Component Value Date/Time   CHOL 190 06/28/2016 0449   TRIG 70 06/28/2016 0449   HDL 48 06/28/2016 0449   CHOLHDL 4.0 06/28/2016 0449   VLDL 14 06/28/2016 0449   LDLCALC 128 (H) 06/28/2016 0449      Many patients benefit from treatment even if their cholesterol is at goal.  Goal: Total Cholesterol (CHOL) less than 160  Goal:  Triglycerides (TRIG) less than 150  Goal:  HDL greater than 40  Goal:  LDL (LDLCALC) less than 100   BLOOD PRESSURE American Stroke Association blood pressure target is less that 120/80 mm/Hg  Your discharge blood pressure is:  BP: (!) 144/92  Monitor your blood pressure  Limit your salt and alcohol intake  Many individuals will require more than one medication for high blood pressure  DIABETES (A1c is a blood sugar average for last 3 months) Goal HGBA1c  is under 7% (HBGA1c is blood sugar average for last 3 months)  Diabetes:   Lab Results  Component Value Date   HGBA1C 5.4 06/28/2016     Your HGBA1c can be lowered with medications, healthy diet, and  exercise.  Check your blood sugar as directed by your physician  Call your physician if you experience unexplained or low blood sugars.  PHYSICAL ACTIVITY/REHABILITATION Goal is 30 minutes at least 4 days per week  Activity: Increase activity slowly, Therapies: Physical Therapy: Home Health Return to work:   Activity decreases your risk of heart attack and stroke and makes your heart stronger.  It helps control your weight and blood pressure; helps you relax and can improve your mood.  Participate in a regular exercise program.  Talk with your doctor about the best form of exercise for you (dancing, walking, swimming, cycling).  DIET/WEIGHT Goal is to maintain a healthy weight  Your discharge diet is: Diet regular Room service appropriate? Yes; Fluid consistency: Thin  liquids Your height is:  Height: 5\' 5"  (165.1 cm) Your current weight is: Weight: 71.3 kg (157 lb 3 oz) Your Body Mass Index (BMI) is:  BMI (Calculated): 26.6  Following the type of diet specifically designed for you will help prevent another stroke.  Your goal weight range is:    Your goal Body Mass Index (BMI) is 19-24.  Healthy food habits can help reduce 3 risk factors for stroke:  High cholesterol, hypertension, and excess weight.  RESOURCES Stroke/Support Group:  Call [phone]   STROKE EDUCATION PROVIDED/REVIEWED AND GIVEN TO PATIENT Stroke warning signs and symptoms How to activate emergency medical system (call 911). Medications prescribed at discharge. Need for follow-up after discharge. Personal risk factors for stroke. Pneumonia vaccine given:  Flu vaccine given:  My questions have been answered, the writing is legible, and I understand these instructions.  I will adhere to these goals & educational materials that have been provided to me after my discharge from the hospital.    alcohol or illicit drug products   My questions have been answered and I understand these instructions. I will adhere to  these goals and the provided educational materials after my discharge from the hospital.  Patient/Caregiver Signature _______________________________ Date __________  Clinician Signature _______________________________________ Date __________  Please bring this form and your medication list with you to all your follow-up doctor's appointments.

## 2016-07-08 ENCOUNTER — Telehealth (HOSPITAL_COMMUNITY): Payer: Self-pay

## 2016-07-08 MED ORDER — HYDROCHLOROTHIAZIDE 25 MG PO TABS: 25.0000 mg | ORAL_TABLET | Freq: Every day | ORAL | 0 refills | Status: DC

## 2016-07-08 MED ORDER — ATORVASTATIN CALCIUM 80 MG PO TABS: 80.0000 mg | ORAL_TABLET | Freq: Every day | ORAL | 0 refills | Status: DC

## 2016-07-08 MED ORDER — FOLIC ACID 1 MG PO TABS: 1.0000 mg | ORAL_TABLET | Freq: Every day | ORAL | 0 refills | Status: DC

## 2016-07-08 MED ORDER — AMLODIPINE BESYLATE 10 MG PO TABS: 10.0000 mg | ORAL_TABLET | Freq: Every day | ORAL | 0 refills | Status: DC

## 2016-07-08 MED ORDER — AMPICILLIN 500 MG PO CAPS: 500.0000 mg | ORAL_CAPSULE | Freq: Four times a day (QID) | ORAL | 0 refills | Status: DC

## 2016-07-08 NOTE — Discharge Summary (Signed)
NAMESHAMIEKA, PRZYWARA NO.:  [id number]  MEDICAL RECORD NO.:  [id number]  LOCATION:                                 FACILITY:  PHYSICIAN:  Charlett Blake, M.D.DATE OF BIRTH:  Mar 13, 1951  DATE OF ADMISSION:  07/02/2016 DATE OF DISCHARGE:  07/08/2016                              DISCHARGE SUMMARY   DISCHARGE DIAGNOSES: 1. 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 2. Subcutaneous Lovenox for DVT prophylaxis. 3. Pain management. 4. Depression. 5. 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 6. Hypertension. 7. Hyperlipidemia. 8. History of cocaine and alcohol abuse. 9. Hepatitis C.  HISTORY OF PRESENT ILLNESS:  This is a 66 year old right-handed female with history of diabetes mellitus, hypertension, hepatitis C, alcohol abuse with liver cirrhosis, and thrombocytopenia.  Lives alone, independent with occasional use of a cane.  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  She has a boyfriend, who can assist.  Presented June 27, 2016, with right- sided weakness and slurred speech, after a fall.  Urine drug screen positive cocaine.  Cranial CT scan showed focal hypodensity within the left paramedian pons suspicious for evolving acute ischemic infarct. The patient did not receive tPA.  CT angiogram of head and neck negative from urgent large vessel occlusion.  There was an incidental findings of 4 x 3 x 4 mm left MCA bifurcation aneurysm.  MRI with left paramedian pontine acute infarct.  Echocardiogram with ejection fraction of 65% and grade 1 diastolic dysfunction.  No regional wall motion abnormalities. Carotid Dopplers with right 60% to 79% ICA stenosis.  Diagnostic angiogram June 30, 2016, per Interventional Radiology identified right carotid stenosis measuring 60%.  She was to follow up outpatient vascular surgery, Dr. Trula Slade for repeat carotid Dopplers.  She remained on aspirin  for CVA prophylaxis.  Subcutaneous Lovenox for DVT prophylaxis.  Physical and occupational therapy ongoing.  The patient was admitted for comprehensive rehab program.  PAST MEDICAL HISTORY:  See discharge diagnoses.  SOCIAL HISTORY:  Lives with 2 elderly parents.  She has a supportive boyfriend. Functional status upon admission to rehab services, minimal assist, 90 feet, one-person handheld assist, minimal assist sit to stand; min-to-mod assist with activities of daily living.  PHYSICAL EXAMINATION:  VITAL SIGNS:  Blood pressure 158/68, pulse 66, temperature 98, respirations 20. GENERAL:  This was an alert female, in no acute distress.  Made good eye contact with examiner. HEENT:  Pupils round and reactive to light. LUNGS:  Clear to auscultation without wheeze. ABDOMEN:  Soft, nontender.  Good bowel sounds. CARDIAC:  Regular rate and rhythm.  REHABILITATION HOSPITAL COURSE:  The patient was admitted to inpatient rehab services with therapies initiated on a 3-hour daily basis, consisting of physical therapy, occupational therapy with rehabilitation nursing as well as speech therapy.  The following issues were addressed during the patient's rehabilitation stay.  Pertaining to Ms. Lobdell, left pontine infarct remained stable and maintained on aspirin therapy. left pontine infarct remained stable and maintained on aspirin therapy. She would follow up with Neurology Services.  Subcutaneous Lovenox for DVT prophylaxis.  No bleeding episodes.  Noted during workup of CVA with 60% to 79% ICA stenosis.  Arrangements  made to follow up with Vascular Surgery, outpatient 6 weeks with Dr. Trula Slade.  Blood pressures remained well controlled on Norvasc and HCTZ.  She would follow up with her primary MD.  Lipitor ongoing for hyperlipidemia.  Urine drug screen was positive for cocaine as well as history of alcohol abuse.  She received full counsel in regard to cessation of illicit drug products, alcohol or smoking.  It was questionable she would be compliant with these  requests.  She did have a history of UTIs, latest urine showed multiple species.  She remained afebrile.  She was receiving empiric treatment.  The patient received weekly collaborative interdisciplinary team conferences to discuss estimated length of stay, family teaching, any barriers to discharge. She was modified independent for mobility.  She could gather her belongings for activities of daily living and homemaking, completed bathing and distance supervision.  Completed toileting and dressing again at distance supervision.  She could ambulate to the ADL apartment without an assistive device and no loss of balance.  It was discussed with family the need for supervision for her safety.  She was able to request her needs.  DISCHARGE MEDICATIONS: 1. Norvasc 10 mg p.o. daily. 2. Aspirin 325 mg p.o. daily. 3. Lipitor 80 mg p.o. daily. 4. Folic acid 1 mg p.o. daily. 5. HCTZ 25 mg p.o. daily. 6. Currently on ampicillin 500 mg p.o. every 6 hours empirically for     UTI.  DIET:  Regular.  She would follow up with Dr. Alysia Penna, at the outpatient rehab center as directed; Dr. Shanon Rosser, medical management. Dr. Antony Contras, Neurology Service, call for appointment.  Dr. Trula Slade, for evaluation of carotid stenosis.  SPECIAL INSTRUCTIONS:  No driving.  No smoking.  No alcohol.  No illicit drug products.     Lauraine Rinne, P.A.   ______________________________ Charlett Blake, M.D.    DA/MEDQ  D:  07/07/2016  T:  07/08/2016  Job:  [id number]  cc:   Eldridge Abrahams, MD Charlett Blake, M.D. Pramod P. Leonie Man, MD Shanon Rosser, MD

## 2016-07-08 NOTE — Telephone Encounter (Signed)
Called to schedule consult, left message for pt to return call. AW 

## 2016-07-08 NOTE — Progress Notes (Signed)
Subjective/Complaints: Some numbness in toes noted on Right foot no increased weakness noted, no toe pain or hx of recent trauma  Review of systems negative for nausea, vomiting, diarrhea, constipation, bowel or bladder incontinence, chest pains or shortness of breath Objective: Vital Signs: Blood pressure (!) 137/57, pulse 64, temperature 97.8 F (36.6 C), temperature source Oral, resp. rate 18, height 5\' 5"  (1.651 m), weight 71.3 kg (157 lb 3 oz), SpO2 96 %. No results found. Results for orders placed or performed during the hospital encounter of 07/02/16 (from the past 72 hour(s))  Urine culture     Status: Abnormal   Collection Time: 07/05/16 11:15 AM  Result Value Ref Range   Specimen Description URINE, RANDOM    Special Requests NONE    Culture MULTIPLE SPECIES PRESENT, SUGGEST RECOLLECTION (A)    Report Status 07/06/2016 FINAL   Urinalysis, Routine w reflex microscopic     Status: Abnormal   Collection Time: 07/05/16 11:15 AM  Result Value Ref Range   Color, Urine YELLOW YELLOW   APPearance CLOUDY (A) CLEAR   Specific Gravity, Urine 1.016 1.005 - 1.030   pH 7.0 5.0 - 8.0   Glucose, UA NEGATIVE NEGATIVE mg/dL   Hgb urine dipstick SMALL (A) NEGATIVE   Bilirubin Urine NEGATIVE NEGATIVE   Ketones, ur NEGATIVE NEGATIVE mg/dL   Protein, ur 30 (A) NEGATIVE mg/dL   Nitrite NEGATIVE NEGATIVE   Leukocytes, UA LARGE (A) NEGATIVE   RBC / HPF TOO NUMEROUS TO COUNT 0 - 5 RBC/hpf   WBC, UA TOO NUMEROUS TO COUNT 0 - 5 WBC/hpf   Bacteria, UA RARE (A) NONE SEEN   Squamous Epithelial / LPF 0-5 (A) NONE SEEN   Mucous PRESENT   Urine drugs of abuse scrn w alc, routine (Ref Lab)     Status: None   Collection Time: 07/05/16 11:15 AM  Result Value Ref Range   Amphetamines, Urine Negative Cutoff=1000 ng/mL    Comment: Amphetamine test includes Amphetamine and Methamphetamine.   Barbiturate, Ur Negative Cutoff=300 ng/mL   Benzodiazepine Quant, Ur Negative Cutoff=300 ng/mL   Cannabinoid  Quant, Ur Negative Cutoff=50 ng/mL   Cocaine (Metab.) Negative Cutoff=300 ng/mL   Opiate Quant, Ur Negative Cutoff=300 ng/mL    Comment: Opiate test includes Codeine and Morphine only.   Phencyclidine, Ur Negative Cutoff=25 ng/mL   Methadone Screen, Urine Negative Cutoff=300 ng/mL   Propoxyphene, Urine Negative Cutoff=300 ng/mL   Ethanol U, Quan Negative Cutoff=0.020 %    Comment: (NOTE) Performed At: [facility] [address] [place], [place] [id number] Ignatius Specking MD [id number]       General: No acute distress Mood and affect are appropriate Heart: Regular rate and rhythm no rubs murmurs or extra sounds Lungs: Clear to auscultation, breathing unlabored, no rales or wheezes Abdomen: Positive bowel sounds, soft nontender to palpation, nondistended Extremities: No clubbing, cyanosis, or edema Skin: No evidence of breakdown, no evidence of rash, left groin healed insertion site from catheterization. No evidence of drainage or erythema Neurologic: Cranial nerves II through XII intact, motor strength is 5/5 in left deltoid, bicep, tricep, grip, hip flexor, knee extensors, ankle dorsiflexor and plantar flexor are 4/5 in the right deltoid, biceps, triceps, grip, hip flexor, knee extensor, ankle dorsiflexor Sensory exam normal sensation to light touch and proprioception in bilateral upper and lower extremities  Musculoskeletal: Full range of motion in all 4 extremities. No joint swelling, no pain or discoloration over toes, identifies LT, pedal pulse is nl   Assessment/Plan:  1. 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 Stable for D/C today F/u PCP in 3-4 weeks F/u PM&R 2 weeks See D/C summary See D/C instructions  FIM: Function - Bathing Position: Shower Body parts bathed by patient: Right arm, Left arm, Chest, Abdomen, Front perineal area, Buttocks, Right upper leg, Left upper leg, Right lower leg, Left lower leg, Back Assist  Level: More than reasonable time  Function- Upper Body Dressing/Undressing What is the patient wearing?: Pull over shirt/dress Pull over shirt/dress - Perfomed by patient: Thread/unthread right sleeve, Thread/unthread left sleeve, Put head through opening, Pull shirt over trunk Assist Level: More than reasonable time Set up : To obtain clothing/put away Function - Lower Body Dressing/Undressing What is the patient wearing?: Pants, Non-skid slipper socks Position:  (chair) Pants- Performed by patient: Thread/unthread right pants leg, Thread/unthread left pants leg, Pull pants up/down Non-skid slipper socks- Performed by patient: Don/doff right sock, Don/doff left sock TED Hose - Performed by helper: Don/doff right TED hose, Don/doff left TED hose Assist for footwear: Independent Assist for lower body dressing: More than reasonable time Set up : Don/doff TED stockings  Function - Toileting Toileting steps completed by patient: Adjust clothing prior to toileting, Performs perineal hygiene, Adjust clothing after toileting Toileting steps completed by helper: Adjust clothing prior to toileting, Performs perineal hygiene, Adjust clothing after toileting (per Candice, NT report) Toileting Assistive Devices: Grab bar or rail Assist level: More than reasonable time  Function Midwife transfer assistive device: Grab bar Assist level to toilet: No Help, no cues, assistive device, takes more than a reasonable amount of time Assist level from toilet: No Help, no cues, assistive device, takes more than a reasonable amount of time  Function - Chair/bed transfer Chair/bed transfer method: Ambulatory Chair/bed transfer assist level: No Help, no cues, assistive device, takes more than a reasonable amount of time Chair/bed transfer details: Verbal cues for precautions/safety, Tactile cues for posture  Function - Locomotion: Wheelchair Will patient use wheelchair at discharge?:  No Type: Manual Wheelchair activity did not occur:  (pt ambulatory on unit) Max wheelchair distance: 17ft  Assist Level: Touching or steadying assistance (Pt > 75%) Assist Level: Touching or steadying assistance (Pt > 75%) Wheel 150 feet activity did not occur: Refused Turns around,maneuvers to table,bed, and toilet,negotiates 3% grade,maneuvers on rugs and over doorsills: No Function - Locomotion: Ambulation Assistive device: No device Max distance: 300 Assist level: No help, No cues, assistive device, takes more than a reasonable amount of time Assist level: No help, No cues, assistive device, takes more than a reasonable amount of time Assist level: No help, No cues, assistive device, takes more than a reasonable amount of time Assist level: No help, No cues, assistive device, takes more than a reasonable amount of time Assist level: No help, No cues, assistive device, takes more than a reasonable amount of time  Function - Comprehension Comprehension: Auditory Comprehension assist level: Follows basic conversation/direction with extra time/assistive device  Function - Expression Expression: Verbal Expression assist level: Expresses basic 75 - 89% of the time/requires cueing 10 - 24% of the time. Needs helper to occlude trach/needs to repeat words.  Function - Social Interaction Social Interaction assist level: Interacts appropriately 90% of the time - Needs monitoring or encouragement for participation or interaction.  Function - Problem Solving Problem solving assist level: Solves basic problems with no assist  Function - Memory Memory assist level: More than reasonable amount of time Patient normally  able to recall (first 3 days only): Current season, Location of own room, Staff names and faces, That he or she is in a hospital  Medical Problem List and Plan: 1. 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  Continue aspirin therapy -stable for d/c2. DVT Prophylaxis/Anticoagulation: Subcutaneous Lovenox. Monitor platelet counts and any signs of bleeding 3. Pain Management: Tylenol as needed 4. Mood: Provide emotional support 5. Neuropsych: This patient iscapable of making decisions on herown behalf. 6. Skin/Wound Care: Routine skin checks 7. 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 Follow-up outpatient vascular surgery Dr. Trula Slade 6 weeks 9.Hypertension. Norvasc 10 mg daily, HCTZ 25 mg daily. Monitor with increased mobility Vitals:   07/07/16 1500 07/08/16 0500  BP: (!) 144/76 (!) 137/57  Pulse: 66 64  Resp: 18 18  Temp: 98.4 F (36.9 C) 97.8 F (36.6 C)   10.Hyperlipidemia. Lipitor 11.UDS positive for cocaine with history also of alcohol abuse. Provide counseling, neuropsych  12.History hepatitis C, f/u PCP/GI   13.  Hx UTI Cx inconclusive, if symptomatic would repeat cx as outpt LOS (Days) 6 A FACE TO FACE EVALUATION WAS PERFORMED  KIRSTEINS,ANDREW E 07/08/2016, 9:13 AM

## 2016-07-08 NOTE — Progress Notes (Signed)
Patient given discharge information from [place], patient and husband verbalized understanding. All patient's belongings packed by husband and patient assisted to car via wheelchair, and with [place], [place] assistance.[place]

## 2016-07-08 NOTE — Progress Notes (Signed)
Social Work  Discharge Note  The overall goal for the admission was met for:   Discharge location: Yes-HOME TO BOYFRIEND'S HOME-24 HR SUPERVISION LEVEL  Length of Stay: Yes-6 DAYS  Discharge activity level: Yes-MOD/I-SUPERVISION LEVEL  Home/community participation: Yes  Services provided included: MD, RD, PT, OT, SLP, RN, CM, TR, Pharmacy and SW  Financial Services: Medicaid  Follow-up services arranged: Home Health: [facility]-PT,OT,SP and Patient/Family has no preference for HH/DME agencies  Comments (or additional information):THOMAS WAS HERE DAILY AND PARTICIPATED IN THERAPIES WITH PT. AWARE OF HER SUPERVISION NEEDS. PT DECLINED SUBSTANCE ABUSE RESOURCESFELT COULD DO THIS ON HER OWN AND HER STROKE SCARED HER STRAIGHT. AWARE OF NA AND RESOURCES IF WOULD LIKE TO PURSUE LATER. NO EQUIPMENT NEEDS. SCAT APPLICATION COMPLETED AND FAXED IN.  Patient/Family verbalized understanding of follow-up arrangements: Yes  Individual responsible for coordination of the follow-up plan: THOMAS-BOYFRIEND  Confirmed correct DME delivered: Elease Hashimoto 07/08/2016    Elease Hashimoto

## 2016-07-15 ENCOUNTER — Telehealth: Payer: Self-pay

## 2016-07-15 NOTE — Telephone Encounter (Signed)
Left VM for Deborah Wise PT with information.

## 2016-07-15 NOTE — Telephone Encounter (Signed)
Kindra PT [facility] Called for home health PT 2xwk for 3wks, States also needs clarification of meds.  While the patient was admitted in the hospital she was told that she is NOT diabetic and is now refusing to take diabetic medications but has agreed to at least continue monitoring her sugar levels.  I can call the order in for the PT but unsure how to proceed with the second issue, please advise.

## 2016-07-15 NOTE — Telephone Encounter (Signed)
No diabetic medications have been prescribed in the hospital. Last hemoglobin A1c was normal. Does not need to check blood sugars. Follow-up with PCP

## 2016-07-29 ENCOUNTER — Inpatient Hospital Stay: Payer: Medicaid Other | Admitting: Physical Medicine & Rehabilitation

## 2016-07-30 DIAGNOSIS — I10 Essential (primary) hypertension: Secondary | ICD-10-CM | POA: Diagnosis not present

## 2016-07-30 DIAGNOSIS — I69322 Dysarthria following cerebral infarction: Secondary | ICD-10-CM | POA: Diagnosis not present

## 2016-07-30 DIAGNOSIS — E119 Type 2 diabetes mellitus without complications: Secondary | ICD-10-CM | POA: Diagnosis not present

## 2016-07-30 DIAGNOSIS — I69351 Hemiplegia and hemiparesis following cerebral infarction affecting right dominant side: Secondary | ICD-10-CM | POA: Diagnosis not present

## 2016-08-05 ENCOUNTER — Telehealth: Payer: Self-pay | Admitting: Physical Medicine & Rehabilitation

## 2016-08-05 NOTE — Telephone Encounter (Signed)
Approval given

## 2016-08-05 NOTE — Telephone Encounter (Signed)
[name] ST with [facility] needs to request 2 additional visits next week for patient.  Patient declined visit this week.  Please call her at [phone].

## 2016-08-09 ENCOUNTER — Encounter: Payer: Medicaid Other | Admitting: Surgery

## 2016-08-24 ENCOUNTER — Encounter: Payer: Medicaid Other | Attending: Physical Medicine & Rehabilitation

## 2016-08-24 ENCOUNTER — Ambulatory Visit (HOSPITAL_BASED_OUTPATIENT_CLINIC_OR_DEPARTMENT_OTHER): Payer: Medicaid Other | Admitting: Physical Medicine & Rehabilitation

## 2016-08-24 ENCOUNTER — Encounter: Payer: Self-pay | Admitting: Physical Medicine & Rehabilitation

## 2016-08-24 VITALS — BP 164/79 | HR 88

## 2016-08-24 DIAGNOSIS — Z8249 Family history of ischemic heart disease and other diseases of the circulatory system: Secondary | ICD-10-CM | POA: Insufficient documentation

## 2016-08-24 DIAGNOSIS — I6350 Cerebral infarction due to unspecified occlusion or stenosis of unspecified cerebral artery: Secondary | ICD-10-CM | POA: Diagnosis not present

## 2016-08-24 DIAGNOSIS — Z833 Family history of diabetes mellitus: Secondary | ICD-10-CM | POA: Insufficient documentation

## 2016-08-24 DIAGNOSIS — M1990 Unspecified osteoarthritis, unspecified site: Secondary | ICD-10-CM | POA: Insufficient documentation

## 2016-08-24 DIAGNOSIS — Z9889 Other specified postprocedural states: Secondary | ICD-10-CM | POA: Insufficient documentation

## 2016-08-24 DIAGNOSIS — Z809 Family history of malignant neoplasm, unspecified: Secondary | ICD-10-CM | POA: Insufficient documentation

## 2016-08-24 DIAGNOSIS — G8110 Spastic hemiplegia affecting unspecified side: Secondary | ICD-10-CM

## 2016-08-24 DIAGNOSIS — K7589 Other specified inflammatory liver diseases: Secondary | ICD-10-CM | POA: Insufficient documentation

## 2016-08-24 DIAGNOSIS — I10 Essential (primary) hypertension: Secondary | ICD-10-CM | POA: Insufficient documentation

## 2016-08-24 DIAGNOSIS — E119 Type 2 diabetes mellitus without complications: Secondary | ICD-10-CM | POA: Diagnosis not present

## 2016-08-24 DIAGNOSIS — D696 Thrombocytopenia, unspecified: Secondary | ICD-10-CM | POA: Insufficient documentation

## 2016-08-24 DIAGNOSIS — K219 Gastro-esophageal reflux disease without esophagitis: Secondary | ICD-10-CM | POA: Insufficient documentation

## 2016-08-24 DIAGNOSIS — K7030 Alcoholic cirrhosis of liver without ascites: Secondary | ICD-10-CM | POA: Insufficient documentation

## 2016-08-24 DIAGNOSIS — I639 Cerebral infarction, unspecified: Secondary | ICD-10-CM

## 2016-08-24 DIAGNOSIS — G8111 Spastic hemiplegia affecting right dominant side: Secondary | ICD-10-CM | POA: Insufficient documentation

## 2016-08-24 NOTE — Patient Instructions (Signed)
Home health should start in 2-3 days  Medication refills are from your primary MD at the urgent care clinic

## 2016-08-24 NOTE — Progress Notes (Signed)
Subjective:    Patient ID: Deborah Wise, female    DOB: 06-Apr-1951, 66 y.o.   MRN: [id number] 66 year old right-handed female with history of diabetes mellitus, hypertension, hepatitis C, alcohol abuse with liver cirrhosis, and thrombocytopenia.  Lives alone, independent with occasional use of a cane.  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  She has a boyfriend, who can assist.  Presented June 27, 2016, with right- sided weakness and slurred speech, after a fall.  Urine drug screen positive cocaine.  Cranial CT scan showed focal hypodensity within the left paramedian pons suspicious for evolving acute ischemic infarct. The patient did not receive tPA.  CT angiogram of head and neck negative from urgent large vessel occlusion.  There was an incidental findings of 4 x 3 x 4 mm left MCA bifurcation aneurysm.  MRI with left paramedian pontine acute infarct.  Echocardiogram with ejection fraction of 65% and grade 1 diastolic dysfunction.  No regional wall motion abnormalities. Carotid Dopplers with right 60% to 79% ICA stenosis.  Diagnostic angiogram June 30, 2016, per Interventional Radiology identified right carotid stenosis measuring 60% DATE OF ADMISSION:  07/02/2016 DATE OF DISCHARGE:  07/08/2016  HPI Stays with boyfriend states she has not had home health since d/c. PCP at urgent care, refilled blood pressure pills. Denies any falls at home Still needs some help with dressing and bathing. Does not use assistive device, but limps because of right foot and ankle weakness. Denies any alcohol or cocaine use Pain Inventory Average Pain 6 Pain Right Now 6 My pain is stabbing and tingling  In the last 24 hours, has pain interfered with the following? General activity 5 Relation with others 4 Enjoyment of life 3 What TIME of day is your pain at its worst? all Sleep (in general) NA  Pain is worse with: walking Pain  improves with: rest and medication Relief from Meds: 9  Mobility use a cane use a walker ability to climb steps?  no do you drive?  no  Function disabled: date disabled . I need assistance with the following:  dressing, bathing, meal prep, household duties and shopping  Neuro/Psych No problems in this area  Prior Studies Any changes since last visit?  no  Physicians involved in your care Any changes since last visit?  no   Family History  Problem Relation Age of Onset  . Diabetes type II Mother   . Hypertension Mother   . Cancer Father     patient does not know what type of cancer   Social History   Social History  . Marital status: Single    Spouse name: N/A  . Number of children: N/A  . Years of education: N/A   Social History Main Topics  . Smoking status: Never Smoker  . Smokeless tobacco: Never Used  . Alcohol use 0.6 oz/week    1 Cans of beer per week     Comment: 1/2 bottle of wine and some beer a day  . Drug use: Yes    Frequency: 1.0 time per week    Types: Cocaine  . Sexual activity: Not Currently   Other Topics Concern  . Not on file   Social History Narrative  . No narrative on file   Past Surgical History:  Procedure Laterality Date  . ANKLE SURGERY    . broken ankle    . IR GENERIC HISTORICAL  06/30/2016   IR ANGIO VERTEBRAL SEL  SUBCLAVIAN INNOMINATE UNI R MOD SED 06/30/2016 [name], MD [facility]  . IR GENERIC HISTORICAL  06/30/2016   IR ANGIO VERTEBRAL SEL VERTEBRAL UNI L MOD SED 06/30/2016 [name], MD [facility]  . IR GENERIC HISTORICAL  06/30/2016   IR ANGIO INTRA EXTRACRAN SEL COM CAROTID INNOMINATE BILAT MOD SED 06/30/2016 [name], MD [facility]  . MULTIPLE EXTRACTIONS WITH ALVEOLOPLASTY  10/25/2011   Procedure: MULTIPLE EXTRACION WITH ALVEOLOPLASTY;  Surgeon: Gae Bon, DDS;  Location: [facility];  Service: Oral Surgery;  Laterality: Bilateral;  Extract teeth numbers one, two, six, seven, eight,  nine, ten, eleven, twelve, fifteen, sixteen, eighteen, nineteen, twenty-three, twenty-four, twenty-five, twenty-seven, thirty-two and alveoplasty.   Past Medical History:  Diagnosis Date  . Arthritis   . Diabetes mellitus without complication ([place])   . Fibroid, uterine   . GERD (gastroesophageal reflux disease)   . Hepatitis    hep C  . Hypertension   . Thrombocytopenia ([place]) 12/25/2014  . Urinary incontinence    Patient noticed mild and is not currently a significant problem   There were no vitals taken for this visit.  Opioid Risk Score:   Fall Risk Score:  `1  Depression screen PHQ 2/9  No flowsheet data found.  Review of Systems  Constitutional: Positive for unexpected weight change.  HENT: Negative.   Eyes: Negative.   Respiratory: Positive for cough.   Cardiovascular: Negative.   Gastrointestinal: Positive for constipation.  Endocrine: Negative.   Genitourinary: Positive for difficulty urinating.  Musculoskeletal: Negative.   Skin: Negative.   Allergic/Immunologic: Negative.   Neurological: Negative.   Hematological: Negative.   Psychiatric/Behavioral: Negative.   All other systems reviewed and are negative.      Objective:   Physical Exam  Constitutional: She is oriented to person, place, and time. She appears well-developed and well-nourished.  HENT:  Head: Normocephalic and atraumatic.  Eyes: Conjunctivae and EOM are normal. Pupils are equal, round, and reactive to light.  Musculoskeletal:       Right shoulder: She exhibits decreased range of motion, pain and decreased strength.  Mild pain with endrange range of motion external rotation, abduction Motor strength is 3 minus at the deltoid, biceps, triceps, grip 4 minus at the hip flexor, knee extensor and 3 minus at the ankle dorsiflexor on the right side. 5/5 in the left deltoid, biceps, triceps, grip, hip flexors, knee extensor, ankle dorsiflexor and plantar flexor.  Neurological: She is alert and  oriented to person, place, and time. No sensory deficit. She exhibits normal muscle tone. Gait abnormal.  Speech is dysarthric Ambulates with right foot drag   Nursing note and vitals reviewed.         Assessment & Plan:  1. 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 Have made referral to home health PT, OT, speech as well as nursing. Physical medicine and rehabilitation follow-up in 6 weeks  Vascular surgery follow-up in 7 weeks  PCP at urgent care follow-up  If right shoulder pain progresses. Consider shoulder injection

## 2016-08-27 ENCOUNTER — Telehealth: Payer: Self-pay

## 2016-08-27 NOTE — Telephone Encounter (Signed)
rec'd phone call from pt.  Stated she is having trouble with her throat, and requested to have her July appt. moved to an earlier date.  Stated her throat hurts with swallowing, and she has some trouble swallowing.  Reported this has been present since her stroke in January.  Advised pt. she should contact her PCP with throat symptoms, as Dr. Trula Slade is a Psychologist, sport and exercise.  Also advised her appt. with [facility]/ Dr. Trula Slade for Carotid evaluation is 10/11/16 @ 11:00 AM.  Pt. Verb. Understanding.

## 2016-08-30 ENCOUNTER — Other Ambulatory Visit: Payer: Self-pay

## 2016-09-01 ENCOUNTER — Other Ambulatory Visit: Payer: Self-pay | Admitting: *Deleted

## 2016-09-01 MED ORDER — AMLODIPINE BESYLATE 10 MG PO TABS: 10.0000 mg | ORAL_TABLET | Freq: Every day | ORAL | 0 refills | Status: DC

## 2016-09-05 IMAGING — CT CT ABDOMEN WO/W CM
3 of 9 series · 13 of 36 positions shown, 18 images · IV contrast (OMNI 300/WATER & [ID] ISOVUE 370)
Comparison: 02/12/2015

CLINICAL DATA: Liver protocol.  Cirrhosis and hepatitis-C.

EXAM:
CT ABDOMEN WITHOUT AND WITH CONTRAST
TECHNIQUE: Multidetector CT imaging of the abdomen was performed following the
standard protocol before and following the bolus administration of
intravenous contrast.
CONTRAST:  30mL OMNIPAQUE IOHEXOL 300 MG/ML  SOLN

[Series 5: arterial (id) · axial · arterial · 0.70mm/px · z∈[-146,-9]mm · 3 of 108 slices shown]
[im 27/108  soft-tissue]
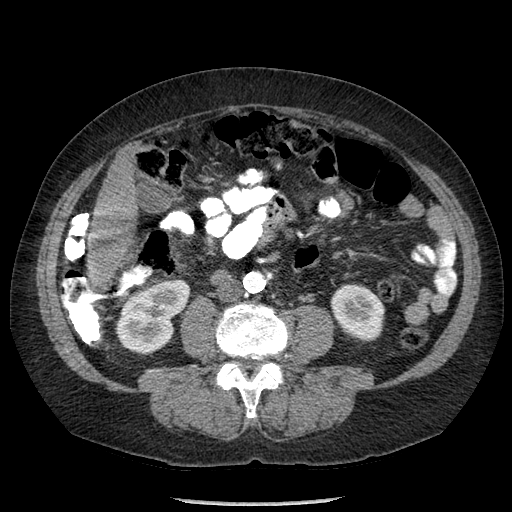
[im 54/108  soft-tissue]
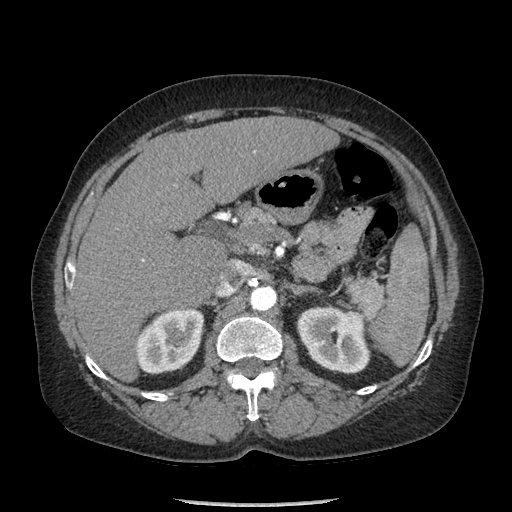
[im 81/108  soft-tissue]
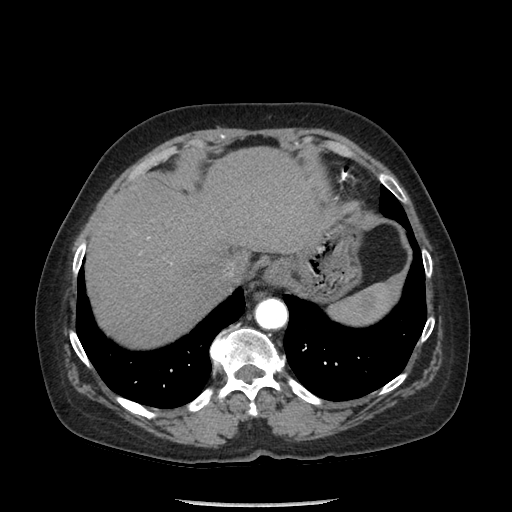

[Series 8: portal venous (id) · axial · portal-venous · 0.70mm/px · z∈[-159,+4]mm · 4 of 109 slices shown, 9 images]
[im 22/109  soft-tissue]
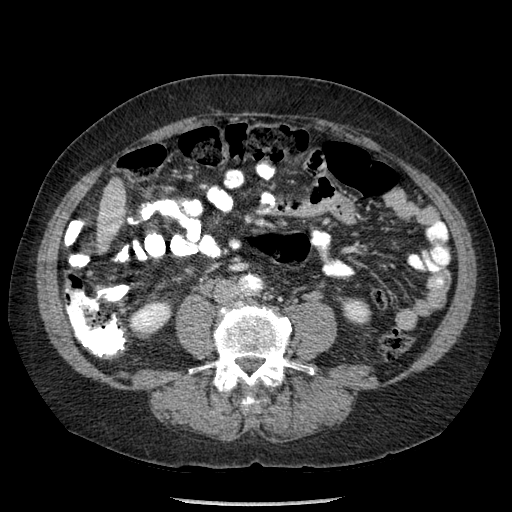
[im 22/109  lung]
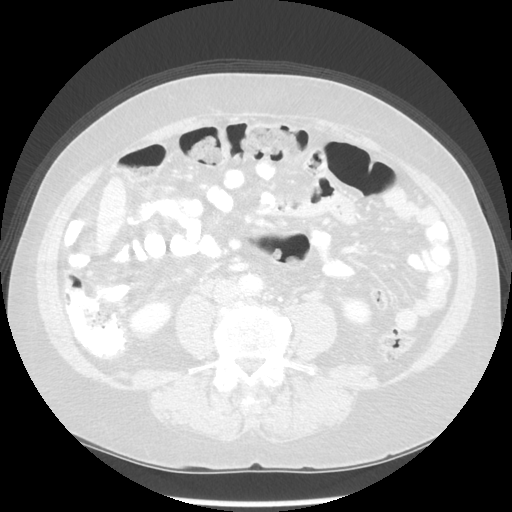
[im 22/109  bone]
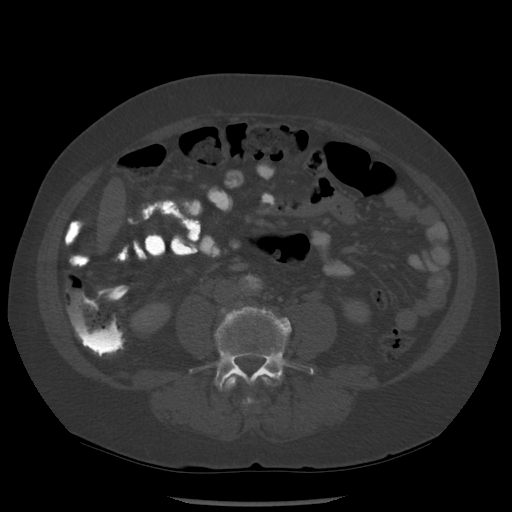
[im 44/109  soft-tissue]
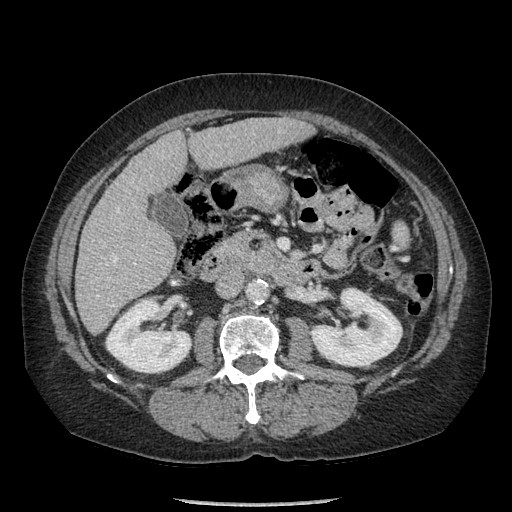
[im 44/109  lung]
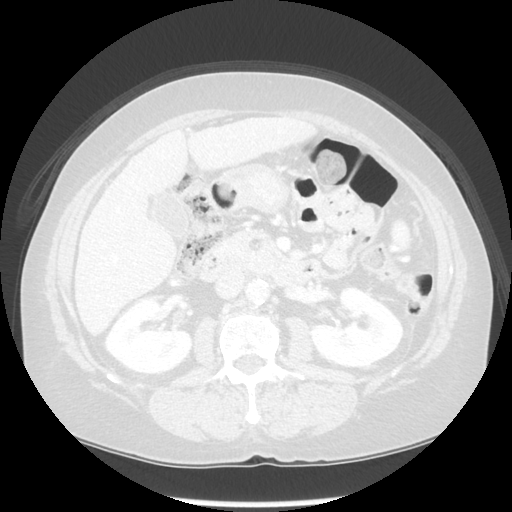
[im 65/109  soft-tissue]
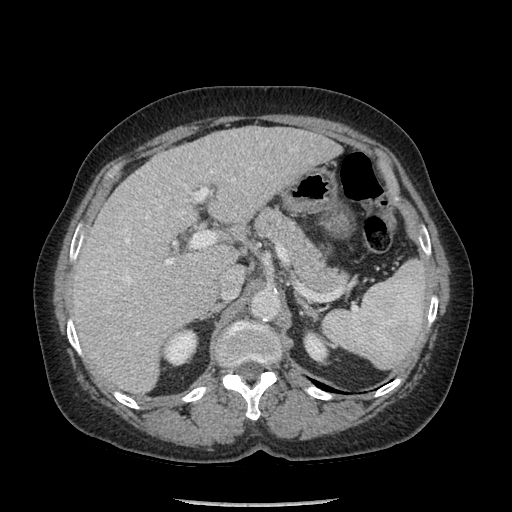
[im 65/109  lung]
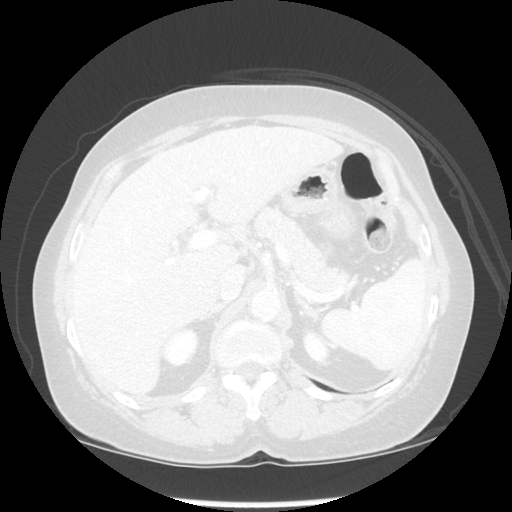
[im 87/109  soft-tissue]
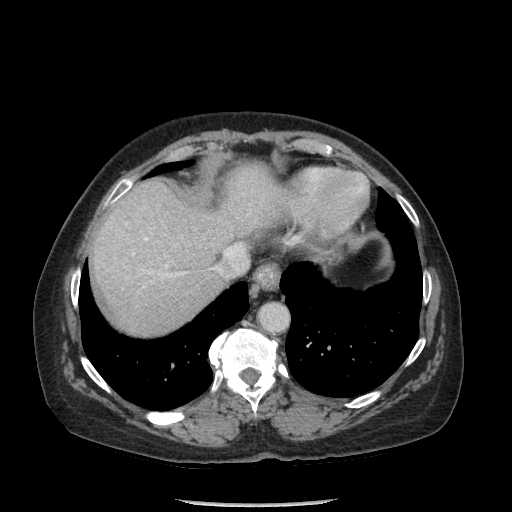
[im 87/109  lung]
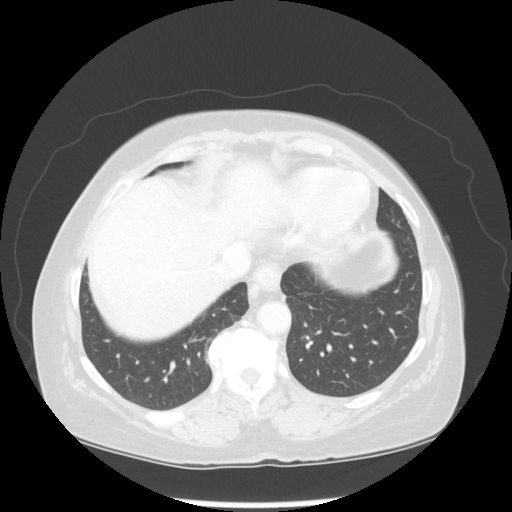

[Series 602: sagittal body · sagittal · 0.70mm/px · 6 of 141 slices shown]
[im 21/141  soft-tissue]
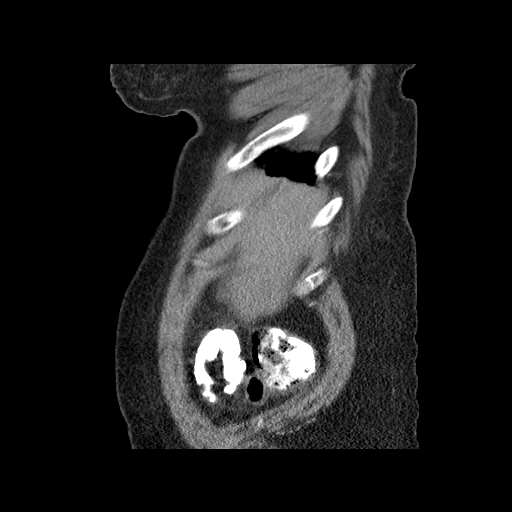
[im 41/141  soft-tissue]
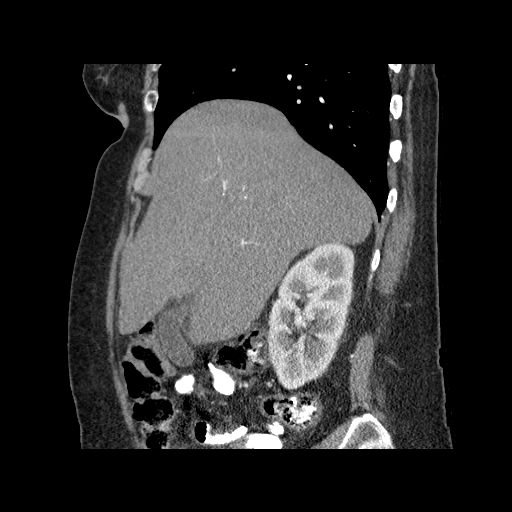
[im 61/141  soft-tissue]
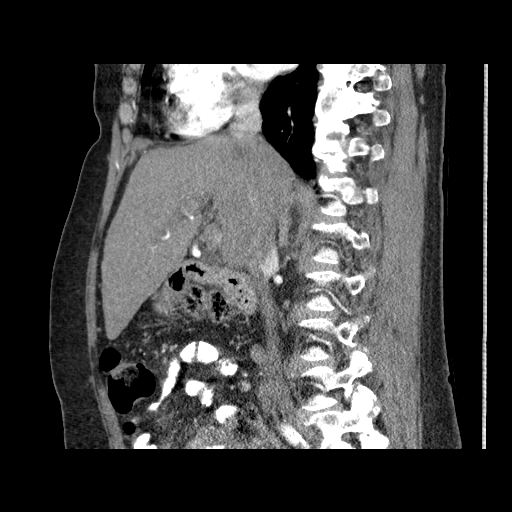
[im 81/141  soft-tissue]
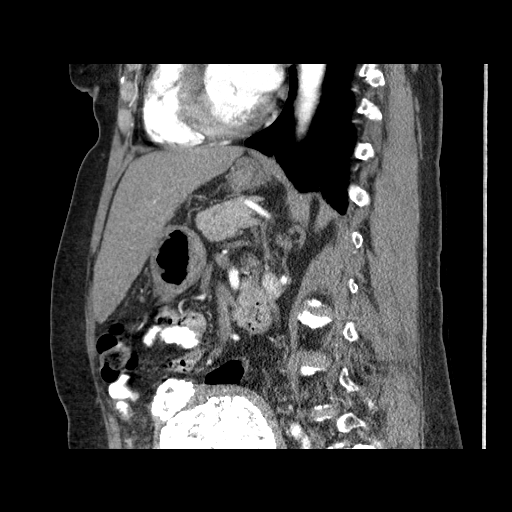
[im 101/141  soft-tissue]
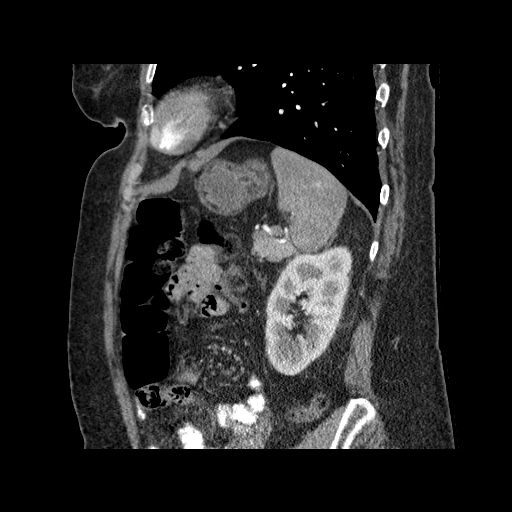
[im 121/141  soft-tissue]
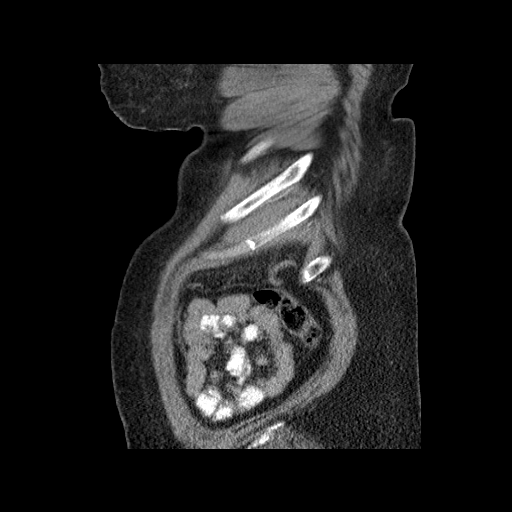

[13 of 36 positions shown; findings below may reference images not displayed]

FINDINGS: Lower chest: The lung bases are clear. No pleural or pericardial
effusion noted.

Hepatobiliary: The liver appears cirrhotic. Hypertrophy the lateral
segment of left lobe of liver is noted and the contour the liver is
diffusely irregular. On the arterial phase images there are no hyper
enhancing liver lesions suggestive of hepatic cellular carcinoma.
The gallbladder appears within normal limits. No biliary dilatation
identified.

Pancreas: No mass, inflammatory changes, or other significant
abnormality.

Spleen: Within normal limits in size and appearance.

Adrenals/Urinary Tract: Normal appearance of the adrenal glands. No
suspicious liver abnormalities Pred set no suspicious kidney
abnormality identified.

Stomach/Bowel: The stomach an the upper abdominal bowel loops have a
normal appearance. Colonic diverticula noted without acute
inflammation.

Vascular/Lymphatic: Calcified atherosclerotic disease involves the
abdominal aorta. No aneurysm. No enlarged retroperitoneal or
mesenteric adenopathy. No enlarged pelvic or inguinal lymph nodes.

Other: Large calcified mass is identified within the pelvis
compatible with fibroids.

Musculoskeletal: Spondylosis is noted within the thoracic spine. No
aggressive lytic or sclerotic bone lesions. Degenerative disc
disease noted within the lower lumbar spine.

The
IMPRESSION: 1. No acute findings within the upper abdomen.
2. Morphologic features a liver compatible with cirrhosis. No
evidence for hepatoma.
3. Partially calcified fibroid uterus
4. Aortic atherosclerosis.

## 2016-09-29 ENCOUNTER — Encounter: Payer: Self-pay | Admitting: Surgery

## 2016-10-05 ENCOUNTER — Encounter: Payer: Self-pay | Admitting: Physical Medicine & Rehabilitation

## 2016-10-05 ENCOUNTER — Other Ambulatory Visit: Payer: Self-pay | Admitting: Surgery

## 2016-10-05 ENCOUNTER — Other Ambulatory Visit: Payer: Self-pay | Admitting: Vascular Surgery

## 2016-10-05 ENCOUNTER — Ambulatory Visit (HOSPITAL_BASED_OUTPATIENT_CLINIC_OR_DEPARTMENT_OTHER): Payer: Medicaid Other | Admitting: Physical Medicine & Rehabilitation

## 2016-10-05 ENCOUNTER — Encounter: Payer: Medicaid Other | Attending: Physical Medicine & Rehabilitation

## 2016-10-05 VITALS — BP 199/80 | HR 82

## 2016-10-05 DIAGNOSIS — G8111 Spastic hemiplegia affecting right dominant side: Secondary | ICD-10-CM | POA: Diagnosis present

## 2016-10-05 DIAGNOSIS — I69322 Dysarthria following cerebral infarction: Secondary | ICD-10-CM

## 2016-10-05 DIAGNOSIS — M1990 Unspecified osteoarthritis, unspecified site: Secondary | ICD-10-CM | POA: Diagnosis not present

## 2016-10-05 DIAGNOSIS — K7030 Alcoholic cirrhosis of liver without ascites: Secondary | ICD-10-CM | POA: Diagnosis not present

## 2016-10-05 DIAGNOSIS — Z833 Family history of diabetes mellitus: Secondary | ICD-10-CM | POA: Diagnosis not present

## 2016-10-05 DIAGNOSIS — Z9889 Other specified postprocedural states: Secondary | ICD-10-CM | POA: Insufficient documentation

## 2016-10-05 DIAGNOSIS — E119 Type 2 diabetes mellitus without complications: Secondary | ICD-10-CM | POA: Insufficient documentation

## 2016-10-05 DIAGNOSIS — I10 Essential (primary) hypertension: Secondary | ICD-10-CM | POA: Diagnosis not present

## 2016-10-05 DIAGNOSIS — I6521 Occlusion and stenosis of right carotid artery: Secondary | ICD-10-CM

## 2016-10-05 DIAGNOSIS — Z809 Family history of malignant neoplasm, unspecified: Secondary | ICD-10-CM | POA: Diagnosis not present

## 2016-10-05 DIAGNOSIS — K219 Gastro-esophageal reflux disease without esophagitis: Secondary | ICD-10-CM | POA: Insufficient documentation

## 2016-10-05 DIAGNOSIS — K7589 Other specified inflammatory liver diseases: Secondary | ICD-10-CM | POA: Insufficient documentation

## 2016-10-05 DIAGNOSIS — Z8249 Family history of ischemic heart disease and other diseases of the circulatory system: Secondary | ICD-10-CM | POA: Diagnosis not present

## 2016-10-05 DIAGNOSIS — D696 Thrombocytopenia, unspecified: Secondary | ICD-10-CM | POA: Insufficient documentation

## 2016-10-05 NOTE — Progress Notes (Signed)
Subjective:    Patient ID: Deborah Wise, female    DOB: 01-Oct-1950, 66 y.o.   MRN: [id number]  HPI Stays with mother or boyfriend  Still states her hands hurt when she dresses She is otherwise independent with all dressing, bathing, hygiene and grooming. She does not use an assistive device for ambulation.  Her main complaint after her stroke is persistent slurring of speech  PCP appt on Thurs Pain Inventory Average Pain 6 Pain Right Now 6 My pain is intermittent  In the last 24 hours, has pain interfered with the following? General activity 0 Relation with others 0 Enjoyment of life 0 What TIME of day is your pain at its worst? varies Sleep (in general) Fair  Pain is worse with: walking, inactivity, standing and some activites Pain improves with: rest Relief from Meds: n/a  Mobility walk without assistance  Function disabled: date disabled .  Neuro/Psych No problems in this area  Prior Studies Any changes since last visit?  no  Physicians involved in your care Any changes since last visit?  no   Family History  Problem Relation Age of Onset  . Diabetes type II Mother   . Hypertension Mother   . Cancer Father     patient does not know what type of cancer   Social History   Social History  . Marital status: Single    Spouse name: N/A  . Number of children: N/A  . Years of education: N/A   Social History Main Topics  . Smoking status: Never Smoker  . Smokeless tobacco: Never Used  . Alcohol use 0.6 oz/week    1 Cans of beer per week     Comment: 1/2 bottle of wine and some beer a day  . Drug use: Yes    Frequency: 1.0 time per week    Types: Cocaine  . Sexual activity: Not Currently   Other Topics Concern  . None   Social History Narrative  . None   Past Surgical History:  Procedure Laterality Date  . ANKLE SURGERY    . broken ankle    . IR GENERIC HISTORICAL  06/30/2016   IR ANGIO VERTEBRAL SEL SUBCLAVIAN INNOMINATE UNI R MOD SED  06/30/2016 [name], MD [facility]  . IR GENERIC HISTORICAL  06/30/2016   IR ANGIO VERTEBRAL SEL VERTEBRAL UNI L MOD SED 06/30/2016 [name], MD [facility]  . IR GENERIC HISTORICAL  06/30/2016   IR ANGIO INTRA EXTRACRAN SEL COM CAROTID INNOMINATE BILAT MOD SED 06/30/2016 [name], MD [facility]  . MULTIPLE EXTRACTIONS WITH ALVEOLOPLASTY  10/25/2011   Procedure: MULTIPLE EXTRACION WITH ALVEOLOPLASTY;  Surgeon: Gae Bon, DDS;  Location: [facility];  Service: Oral Surgery;  Laterality: Bilateral;  Extract teeth numbers one, two, six, seven, eight, nine, ten, eleven, twelve, fifteen, sixteen, eighteen, nineteen, twenty-three, twenty-four, twenty-five, twenty-seven, thirty-two and alveoplasty.   Past Medical History:  Diagnosis Date  . Arthritis   . Diabetes mellitus without complication ([place])   . Fibroid, uterine   . GERD (gastroesophageal reflux disease)   . Hepatitis    hep C  . Hypertension   . Thrombocytopenia ([place]) 12/25/2014  . Urinary incontinence    Patient noticed mild and is not currently a significant problem   BP (!) 199/80   Pulse 82   SpO2 96%   Opioid Risk Score:   Fall Risk Score:  `1  Depression screen PHQ 2/9  No flowsheet data found.  Review of Systems  Constitutional: Negative.   HENT: Negative.   Eyes: Negative.   Respiratory: Negative.   Cardiovascular: Negative.   Gastrointestinal: Negative.   Endocrine: Negative.   Genitourinary: Negative.   Musculoskeletal: Positive for arthralgias.  Skin: Negative.   Neurological: Negative.   Hematological: Negative.   Psychiatric/Behavioral: Negative.   All other systems reviewed and are negative.      Objective:   Physical Exam  Constitutional: She is oriented to person, place, and time. She appears well-developed and well-nourished.  HENT:  Head: Normocephalic and atraumatic.  Eyes: Conjunctivae are normal. Pupils are equal, round, and reactive to light.  Neurological: She  is alert and oriented to person, place, and time.  Psychiatric: She has a normal mood and affect.  Nursing note and vitals reviewed.   Positive dysarthria, moderate response to cueing to increase vocal volume. No evidence of a aphasia. Motor strength is 5 minus/5, bilateral deltoid, bicep, tricep, grip, hip flexor, knee extensor, ankle dorsiflexor. Sensation intact bilateral upper and lower limbs. Gait without evidence to drag or knee instability. No assistive device.      Assessment & Plan:  1. History of CVA, left paramedian pontine. Diabetes, hypertension, cocaine abuse as risk factors. Follow-up with PCP for blood pressure. Referral to outpatient speech therapy, patient states she does have transportation. I do not think she needs outpatient PT or OT. At this point  When necessary follow-up. PM&R

## 2016-10-05 NOTE — Patient Instructions (Signed)
Speech therapy office will call you with appt

## 2016-10-11 ENCOUNTER — Ambulatory Visit (HOSPITAL_COMMUNITY)
Admission: RE | Admit: 2016-10-11 | Discharge: 2016-10-11 | Disposition: A | Discharge status: Home / Self Care | Payer: Medicaid Other | Source: Ambulatory Visit | Attending: Surgery | Admitting: Surgery

## 2016-10-11 ENCOUNTER — Encounter: Payer: Self-pay | Admitting: Surgery

## 2016-10-11 ENCOUNTER — Ambulatory Visit (INDEPENDENT_AMBULATORY_CARE_PROVIDER_SITE_OTHER): Payer: Medicaid Other | Admitting: Surgery

## 2016-10-11 VITALS — BP 179/84 | HR 90 | Temp 98.6°F | Resp 20 | Ht 65.0 in | Wt 170.0 lb

## 2016-10-11 DIAGNOSIS — I6521 Occlusion and stenosis of right carotid artery: Secondary | ICD-10-CM | POA: Diagnosis present

## 2016-10-11 LAB — VAS US CAROTID
LEFT ECA DIAS: -14 cm/s
Left CCA dist dias: -24 cm/s
Left CCA dist sys: -131 cm/s
Left CCA prox dias: 16 cm/s
Left CCA prox sys: 136 cm/s
Left ICA dist dias: -31 cm/s
Left ICA dist sys: -120 cm/s
Left ICA prox dias: 20 cm/s
Left ICA prox sys: 77 cm/s
RIGHT CCA MID DIAS: 12 cm/s
RIGHT ECA DIAS: 120 cm/s
Right CCA prox dias: 16 cm/s
Right CCA prox sys: 91 cm/s
Right cca dist sys: -104 cm/s

## 2016-10-11 NOTE — Progress Notes (Signed)
Vascular and Vein Specialist of [facility]  Patient name: Deborah Wise MRN: [id number] DOB: 1950-09-07 Sex: female   REASON FOR VISIT:    Carotid stneosis  HISOTRY OF PRESENT ILLNESS:    Deborah Wise is a 66 y.o. female who returns today for follow-up.  She initially presented to the hospital in January 2018 with slurred speech and right-sided weakness.  Her UDS was positive for cocaine.  She has a history of hep C and cirrhosis.  Her MRI was consistent with a left brain stroke.  Her CT angiogram revealed a tight right carotid artery stenosis.  She went on to have a cerebral angiogram which revealed the stenosis in the right carotid artery was 60-65 percent.  The patient was ultimately discharged the hospital.  She reports that other than slurred speech and some right-sided weakness this, her symptoms have remained stable.  She has no left-sided symptoms.  The patient suffers from diabetes which is medically managed.  She takes a statin for hypercholesterolemia and is on an ACE inhibitor for hypertension.   PAST MEDICAL HISTORY:   Past Medical History:  Diagnosis Date  . Arthritis   . Carotid artery occlusion   . Diabetes mellitus without complication ([place])   . Fibroid, uterine   . GERD (gastroesophageal reflux disease)   . Hepatitis    hep C  . Hypertension   . Thrombocytopenia ([place]) 12/25/2014  . Urinary incontinence    Patient noticed mild and is not currently a significant problem     FAMILY HISTORY:   Family History  Problem Relation Age of Onset  . Diabetes type II Mother   . Hypertension Mother   . Cancer Father     patient does not know what type of cancer    SOCIAL HISTORY:   Social History  Substance Use Topics  . Smoking status: Never Smoker  . Smokeless tobacco: Never Used  . Alcohol use 0.6 oz/week    1 Cans of beer per week     Comment: 1/2 bottle of wine and some beer a day     ALLERGIES:   No Known  Allergies   CURRENT MEDICATIONS:   Current Outpatient Prescriptions  Medication Sig Dispense Refill  . amLODipine (NORVASC) 10 MG tablet Take 1 tablet (10 mg total) by mouth daily. 30 tablet 0  . aspirin EC 325 MG EC tablet Take 1 tablet (325 mg total) by mouth daily. 30 tablet 0  . atorvastatin (LIPITOR) 80 MG tablet Take 1 tablet (80 mg total) by mouth daily at 6 PM. 30 tablet 0  . glucose monitoring kit (FREESTYLE) monitoring kit 1 each by Does not apply route 4 (four) times daily - after meals and at bedtime. 1 month Diabetic Testing Supplies for QAC-QHS accuchecks. Any brand OK 1 each 1  . hydrochlorothiazide (HYDRODIURIL) 25 MG tablet Take 1 tablet (25 mg total) by mouth daily. 30 tablet 0  . lisinopril (PRINIVIL,ZESTRIL) 20 MG tablet Take 20 mg by mouth daily.  1  . Oxycodone HCl 10 MG TABS TAKE 1 TABLET BY MOUTH 3 TIMES A DAY AS NEEDED FOR PAIN  0  . folic acid (FOLVITE) 1 MG tablet Take 1 tablet (1 mg total) by mouth daily. (Patient not taking: Reported on 10/11/2016) 30 tablet 0   No current facility-administered medications for this visit.     REVIEW OF SYSTEMS:   _0  denotes positive finding, _1  denotes negative finding Cardiac  Comments:  Chest pain or chest pressure:  Shortness of breath upon exertion: x   Short of breath when lying flat:    Irregular heart rhythm: x       Vascular    Pain in calf, thigh, or hip brought on by ambulation:    Pain in feet at night that wakes you up from your sleep:     Blood clot in your veins:    Leg swelling:         Pulmonary    Oxygen at home:    Productive cough:  x   Wheezing:         Neurologic    Sudden weakness in arms or legs:     Sudden numbness in arms or legs:     Sudden onset of difficulty speaking or slurred speech: x   Temporary loss of vision in one eye:     Problems with dizziness:         Gastrointestinal    Blood in stool:     Vomited blood:         Genitourinary    Burning when urinating:     Blood  in urine:        Psychiatric    Major depression:         Hematologic    Bleeding problems:    Problems with blood clotting too easily:        Skin    Rashes or ulcers:        Constitutional    Fever or chills:      PHYSICAL EXAM:   Vitals:   10/11/16 1134 10/11/16 1137  BP: (!) 178/81 (!) 179/84  Pulse: 90   Resp: 20   Temp: 98.6 F (37 C)   TempSrc: Oral   SpO2: 100%   Weight: 170 lb (77.1 kg)   Height: _0  (1.651 m)     GENERAL: The patient is a well-nourished female, in no acute distress. The vital signs are documented above. CARDIAC: There is a regular rate and rhythm.  VASCULAR: no carottid bruits PULMONARY: Non-labored respirations ABDOMEN: Soft and non-tender with normal pitched bowel sounds.  MUSCULOSKELETAL: There are no major deformities or cyanosis. NEUROLOGIC: No focal weakness or paresthesias are detected. SKIN: There are no ulcers or rashes noted. PSYCHIATRIC: The patient has a normal affect.  STUDIES:   I have ordered and reviewed her duplex.  This shows 60-79% right carotid stenosis.  The left side is in the 1-39 percent category  MEDICAL ISSUES:   Asymptomatic right carotid stenosis.  The patient will continue with ongoing surveillance.  The next study will be in 6 months.  Surgery would be recommended if she develops symptoms or if the stenosis progresses to greater than 80%.    Annamarie Major, MD [facility] and Vein Specialists of [facility] [phone] Pager [phone]

## 2016-10-14 NOTE — Addendum Note (Signed)
Addended by: Lianne Cure A on: 10/14/2016 02:58 PM   Modules accepted: Orders

## 2016-12-10 ENCOUNTER — Encounter: Payer: Self-pay | Admitting: Obstetrics & Gynecology

## 2016-12-27 ENCOUNTER — Encounter: Payer: Medicaid Other | Admitting: Surgery

## 2016-12-27 ENCOUNTER — Encounter (HOSPITAL_COMMUNITY): Payer: Medicaid Other

## 2016-12-29 ENCOUNTER — Encounter: Payer: Medicaid Other | Admitting: Obstetrics & Gynecology

## 2017-01-05 ENCOUNTER — Telehealth: Payer: Self-pay | Admitting: *Deleted

## 2017-01-05 NOTE — Telephone Encounter (Signed)
Patient was referred to this clinic from [facility] re: uterine leiomyoma. Dr Harolyn Rutherford has reviewed the referral and wants to know patient's symptoms/reason for referral. Attempted to call patient. Mobile # "unreachable", home # rang with no answer or vm to leave message. Will try again later. Records in referral box.

## 2017-01-24 ENCOUNTER — Telehealth: Payer: Self-pay | Admitting: General Practice

## 2017-01-24 NOTE — Telephone Encounter (Signed)
Patient called into office stating she is returning our call. Informed patient of appt on 9/12 and asked if she was having any symptoms with her fibroids like pain or bleeding. Patient states she isn't having bleeding but does get stomach pains and back pain. Patient had no questions. Will route to Dr Harolyn Rutherford.

## 2017-01-24 NOTE — Telephone Encounter (Signed)
Called patient at home number, no answer & phone rings continuously with no voicemail. Called mobile number & a man answered stating she wasn't in but would tell her we called.

## 2017-02-16 ENCOUNTER — Other Ambulatory Visit (HOSPITAL_COMMUNITY)
Admission: RE | Admit: 2017-02-16 | Discharge: 2017-02-16 | Disposition: A | Discharge status: Home / Self Care | Payer: Medicare Other | Source: Ambulatory Visit | Attending: Obstetrics & Gynecology | Admitting: Obstetrics & Gynecology

## 2017-02-16 ENCOUNTER — Encounter: Payer: Self-pay | Admitting: Obstetrics & Gynecology

## 2017-02-16 ENCOUNTER — Ambulatory Visit (INDEPENDENT_AMBULATORY_CARE_PROVIDER_SITE_OTHER): Payer: Medicare Other | Admitting: Obstetrics & Gynecology

## 2017-02-16 ENCOUNTER — Ambulatory Visit: Payer: Self-pay | Admitting: Clinical

## 2017-02-16 VITALS — BP 169/74 | HR 83 | Ht 65.0 in | Wt 175.0 lb

## 2017-02-16 DIAGNOSIS — Z1239 Encounter for other screening for malignant neoplasm of breast: Secondary | ICD-10-CM

## 2017-02-16 DIAGNOSIS — Z01419 Encounter for gynecological examination (general) (routine) without abnormal findings: Secondary | ICD-10-CM | POA: Insufficient documentation

## 2017-02-16 DIAGNOSIS — D251 Intramural leiomyoma of uterus: Secondary | ICD-10-CM

## 2017-02-16 DIAGNOSIS — D250 Submucous leiomyoma of uterus: Secondary | ICD-10-CM

## 2017-02-16 DIAGNOSIS — R4589 Other symptoms and signs involving emotional state: Secondary | ICD-10-CM

## 2017-02-16 DIAGNOSIS — Z0000 Encounter for general adult medical examination without abnormal findings: Secondary | ICD-10-CM | POA: Diagnosis not present

## 2017-02-16 NOTE — BH Specialist Note (Deleted)
Error

## 2017-02-16 NOTE — Progress Notes (Signed)
Patient verbally consented to meet with [facility] Clinician about presenting concerns. Dr Ihor Dow informed

## 2017-02-16 NOTE — Progress Notes (Signed)
Subjective:     Deborah Wise is a 66 y.o. female here for a routine exam. [id number] LMP >10 years. Pt denies bleeding since that time Occ pain in pelvis. NOT on a regular basis. Pt is sexually active. No pain with intercousre. No pelvic pain or pressure with intercourse. Has not taken any meds for her pain. Current complaints: Pt with low back pain was referred to see if her back pain was caused by her fibroids. Fiborids seen on imaging.     Gynecologic History No LMP recorded. Patient is postmenopausal. Contraception: post menopausal status Last Pap: unknown. Results were: unknown Last mammogram: 10/2012. Results were: benign changes  Obstetric History OB History  Gravida Para Term Preterm AB Living  5 3 3   2 3   SAB TAB Ectopic Multiple Live Births  1 1          # Outcome Date GA Lbr Len/2nd Weight Sex Delivery Anes PTL Lv  5 TAB           4 SAB           3 Term      Vag-Spont     2 Term      Vag-Spont     1 Term      Vag-Spont          The following portions of the patient's history were reviewed and updated as appropriate: allergies, current medications, past family history, past medical history, past social history, past surgical history and problem list.  Review of Systems Pertinent items are noted in HPI. Pertinent items noted in HPI and remainder of comprehensive ROS otherwise negative.    Objective:  BP (!) 169/74   Pulse 83   Ht 5\' 5"  (1.651 m)   Wt 175 lb (79.4 kg)   BMI 29.12 kg/m  General Appearance:    Alert, cooperative, no distress, appears stated age  Head:    Normocephalic, without obvious abnormality, atraumatic  Eyes:    conjunctiva/corneas clear, EOM's intact, both eyes  Ears:    Normal external ear canals, both ears  Nose:   Nares normal, septum midline, mucosa normal, no drainage    or sinus tenderness  Throat:   Lips, mucosa, and tongue normal; teeth and gums normal  Neck:   Supple, symmetrical, trachea midline, no adenopathy;    thyroid:  no  enlargement/tenderness/nodules  Back:     Symmetric, no curvature, ROM normal, no CVA tenderness  Lungs:     Clear to auscultation bilaterally, respirations unlabored  Chest Wall:    No tenderness or deformity   Heart:    Regular rate and rhythm, S1 and S2 normal, no murmur, rub   or gallop  Breast Exam:    No tenderness, masses, or nipple abnormality  Abdomen:     Soft, non-tender, bowel sounds active all four quadrants,    no masses, no organomegaly  Genitalia:    Normal female without lesion, discharge or tenderness; NT. Uterus enlarged 14 weeks sized. Non tender mid position     Extremities:   Extremities normal, atraumatic, no cyanosis or edema  Pulses:   2+ and symmetric all extremities  Skin:   Skin color, texture, turgor normal, no rashes or lesions   [facility] 08/18/2016  INDICATION: Intra-abdominal and pelvic swelling, mass and lump, unspecified site.Lower abdominal and pelvic pain. History of fibroid. COMPARISON:11/28/2014 TECHNIQUE:CT ABDOMEN PELVIS W IV CONTRAST - 80 mL Isovue 370 were administered intravenouslyRadiation dose reduction was utilized (automated exposure control, mA  or kV adjustment based on patient size, or iterative image reconstruction).  FINDINGS:  CT ABDOMEN: Lung bases demonstrate bibasilar subsegmental atelectasis. Heart size normal. Cirrhotic morphology to the liver without focal liver lesion. Normal gallbladder, biliary tree, adrenal glands and kidneys. 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  Splenic granulomas. Aortoiliac atherosclerosis without aneurysm. No ascites or adenopathy.  CT PELVIS: Again seen is a giant fibroid uterus measuring approximately 13 x 13 x 11 cm (previous 14 x 11 x 12 cm). Normal ovaries. Bladder wall thickening unchanged. No pelvic adenopathy or ascites. Colonic diverticulosis without diverticulitis. Oral  contrast reaches the colon by the time of the exam and there is no evidence for bowel obstruction or appendicitis. DDD L3-L4  and L2-L3. Lumbar facet joint DJD.   11/05/2016 TECHNIQUE:Transabdominal, transvaginal grayscale sonographic images of the pelvis supplemented by color and spectral Doppler as needed.  COMPARISON: CT from 08/18/2016.  INDICATION: Leiomyoma of uterus, unspecified  FINDINGS:   There are multiple uterine fibroids which are heavily calcified, as seen on the prior CT. This creates dense posterior acoustic shadowing and visualization is significantly obscured throughout the pelvis. The individual lesions cannot be adequately  defined. The cervix is grossly unremarkable. The endometrium is not seen.  What is likely the right ovary measures 3.4 x 1.3 x 2.7 cm, with a small amount of nonspecific calcification. This could alternatively represent an exophytic fibroid. A left ovary is not visualized. No free fluid is identified. 01/25/2015 CLINICAL DATA:  Left flank pain since April 2016  EXAM: TRANSABDOMINAL AND TRANSVAGINAL ULTRASOUND OF PELVIS  TECHNIQUE: Both transabdominal and transvaginal ultrasound examinations of the pelvis were performed. Transabdominal technique was performed for global imaging of the pelvis including uterus, ovaries, adnexal regions, and pelvic cul-de-sac. It was necessary to proceed with endovaginal exam following the transabdominal exam to visualize the right ovary and left adnexa.  COMPARISON:  None  FINDINGS: Uterus  Uterus is poorly evaluated on ultrasound due to multiple calcified uterine fibroids which shadow and distort the uterine anatomy. Gross uterine measurements are 12.7 x 11.0 x 9.9 cm transabdominally. Transvaginal measurements could not be provided. Discrete fibroid measurements could not be provided.  Endometrium  Not discretely visualized.  Right ovary  Measurements: 3.0 x 1.2 x 2.4 cm. 5 mm dystrophic calcification.  Left ovary  Not discretely visualized.  Other findings  No free fluid.  IMPRESSION: Uterus is  poorly evaluated on ultrasound due to multiple shadowing calcified uterine fibroids.  Endometrium and left ovary are not discretely visualized.  Right ovary is within normal limits.  Assessment:    Healthy female exam.   Asymptomatic uterine fibroids- no further eval needed Breast cancer screening   Plan:    Mammogram ordered.    F/u PAP with hr HPV F/u in 1 year or sooner prn  Carolyn L. Harraway-Smith, M.D., Cherlynn June

## 2017-02-17 NOTE — Progress Notes (Signed)
error 

## 2017-02-18 ENCOUNTER — Other Ambulatory Visit: Payer: Self-pay | Admitting: Nurse Practitioner

## 2017-02-18 DIAGNOSIS — K7469 Other cirrhosis of liver: Secondary | ICD-10-CM

## 2017-02-21 LAB — CYTOLOGY - PAP
Diagnosis: NEGATIVE
HPV: NOT DETECTED

## 2017-03-01 ENCOUNTER — Ambulatory Visit
Admission: RE | Admit: 2017-03-01 | Discharge: 2017-03-01 | Disposition: A | Discharge status: Home / Self Care | Payer: Medicaid Other | Source: Ambulatory Visit | Attending: Obstetrics & Gynecology | Admitting: Obstetrics & Gynecology

## 2017-03-01 DIAGNOSIS — Z1239 Encounter for other screening for malignant neoplasm of breast: Secondary | ICD-10-CM

## 2017-03-03 ENCOUNTER — Ambulatory Visit
Admission: RE | Admit: 2017-03-03 | Discharge: 2017-03-03 | Disposition: A | Discharge status: Home / Self Care | Payer: Medicaid Other | Source: Ambulatory Visit | Attending: Nurse Practitioner | Admitting: Nurse Practitioner

## 2017-03-03 DIAGNOSIS — K7469 Other cirrhosis of liver: Secondary | ICD-10-CM

## 2017-03-08 ENCOUNTER — Telehealth: Payer: Self-pay | Admitting: Obstetrics & Gynecology

## 2017-03-08 NOTE — Telephone Encounter (Signed)
Patient called to ask about a referral that was done by [facility]. She doesn't know where this is at? Patient request call back from nurse.

## 2017-03-15 ENCOUNTER — Telehealth: Payer: Self-pay | Admitting: *Deleted

## 2017-03-15 NOTE — Telephone Encounter (Signed)
Attempted to call home number which did not work, busy signal. Called cell phone which went to voice mail of "Gregary Signs" who is an emergency contact. Left message stating that I am returning Doaa's phone call, please let her know she can return my call at the clinic.

## 2017-03-15 NOTE — Telephone Encounter (Signed)
-----   Message from Lavonia Drafts, MD sent at 03/15/2017  1:57 PM EDT ----- Regarding: RE: referral Please call pt.   She does not need any further info for her pelvic. She just needs the mammogram.  Thx, clh-S  ----- Message ----- From: Riccardo Dubin, RN Sent: 03/15/2017  11:42 AM To: Lavonia Drafts, MD Subject: referral                                       Hi Dr Ihor Dow, Just making sure that no other referral was needed for this patient other than the mammography which is complete. See Antoinette's telephone note. Thayer Headings

## 2017-03-15 NOTE — Telephone Encounter (Signed)
Inboxed Dr Ihor Dow, making sure no other referral needed.

## 2017-03-16 ENCOUNTER — Ambulatory Visit: Payer: Self-pay

## 2017-03-16 NOTE — BH Specialist Note (Deleted)
Integrated Behavioral Health Initial Visit  MRN: [id number] Name: Deborah Wise  Number of [facility] Clinician visits:: 1/6 Session Start time: ***  Session End time: *** Total time: {IBH Total Time:21014050}  Type of Service: [place] Interpretor:No. Interpretor Name and Language: n/a   Warm Hand Off Completed.       SUBJECTIVE: Deborah Wise is a 66 y.o. female accompanied by {CHL AMB ACCOMPANIED HC:6237628315} Patient was referred by Dr Ihor Dow for symptoms of depression. Patient reports the following symptoms/concerns: *** Duration of problem: ***; Severity of problem: {Mild/Moderate/Severe:20260}  OBJECTIVE: Mood: {BHH MOOD:22306} and Affect: {BHH AFFECT:22307} Risk of harm to self or others: No plan to harm self or others  LIFE CONTEXT: Family and Social: *** School/Work: *** Self-Care: *** Life Changes: ***  GOALS ADDRESSED: Patient will: 1. Reduce symptoms of: {IBH Symptoms:21014056} 2. Increase knowledge and/or ability of: {IBH Patient Tools:21014057}  3. Demonstrate ability to: {IBH Goals:21014053}  INTERVENTIONS: Interventions utilized: {IBH Interventions:21014054}  Standardized Assessments completed: GAD-7 and PHQ 9  ASSESSMENT: Patient currently experiencing ***.   Patient may benefit from psychoeducation and brief therapeutic intervention regarding coping with symptoms of depression.  PLAN: 1. Follow up with behavioral health clinician on : *** 2. Behavioral recommendations: *** 3. Referral(s): {IBH Referrals:21014055} 4. "From scale of 1-10, how likely are you to follow plan?": ***  Garlan Fair, LCSWA  Depression screen [facility] 2/9 02/16/2017  Decreased Interest 2  Down, Depressed, Hopeless 1  PHQ - 2 Score 3  Altered sleeping 2  Tired, decreased energy 3  Change in appetite 3  Feeling bad or failure about yourself  1  Trouble concentrating 1  Moving slowly or  fidgety/restless 2  Suicidal thoughts 0  PHQ-9 Score 15   GAD 7 : Generalized Anxiety Score 02/16/2017  Nervous, Anxious, on Edge 1  Control/stop worrying 1  Worry too much - different things 1  Trouble relaxing 1  Restless 1  Easily annoyed or irritable 2  Afraid - awful might happen 0  Total GAD 7 Score 7

## 2017-04-12 ENCOUNTER — Encounter: Payer: Self-pay | Admitting: Nurse Practitioner

## 2017-04-18 ENCOUNTER — Encounter (HOSPITAL_COMMUNITY): Payer: Medicaid Other

## 2017-04-18 ENCOUNTER — Ambulatory Visit: Payer: Medicaid Other | Admitting: Surgery

## 2017-04-25 ENCOUNTER — Encounter: Payer: Self-pay | Admitting: Surgery

## 2017-04-25 ENCOUNTER — Ambulatory Visit (HOSPITAL_COMMUNITY)
Admission: RE | Admit: 2017-04-25 | Discharge: 2017-04-25 | Disposition: A | Discharge status: Home / Self Care | Payer: Medicare Other | Source: Ambulatory Visit | Attending: Surgery | Admitting: Surgery

## 2017-04-25 ENCOUNTER — Ambulatory Visit (INDEPENDENT_AMBULATORY_CARE_PROVIDER_SITE_OTHER): Payer: Medicare Other | Admitting: Surgery

## 2017-04-25 VITALS — BP 207/95 | HR 95 | Temp 99.1°F | Resp 16 | Ht 65.0 in | Wt 176.0 lb

## 2017-04-25 DIAGNOSIS — I6521 Occlusion and stenosis of right carotid artery: Secondary | ICD-10-CM | POA: Diagnosis not present

## 2017-04-25 LAB — VAS US CAROTID
LEFT ECA DIAS: -42 cm/s
Left CCA dist dias: -30 cm/s
Left CCA dist sys: -108 cm/s
Left CCA prox dias: 25 cm/s
Left CCA prox sys: 96 cm/s
Left ICA dist dias: -23 cm/s
Left ICA dist sys: -71 cm/s
Left ICA prox dias: -37 cm/s
Left ICA prox sys: -103 cm/s
RIGHT CCA MID DIAS: -12 cm/s
RIGHT ECA DIAS: -159 cm/s
Right CCA prox dias: 8 cm/s
Right CCA prox sys: 82 cm/s
Right cca dist sys: -68 cm/s

## 2017-04-25 NOTE — Progress Notes (Signed)
 [facility]  Patient name: Deborah Wise MRN: [id number] DOB: 11/05/1950 Sex: female   REASON FOR VISIT:    Follow up   HISOTRY OF PRESENT ILLNESS:   Deborah Wise is a 66 y.o. female who returns today for follow-up.  She initially presented to the hospital in January 2018 with slurred speech and right-sided weakness.  Her UDS was positive for cocaine.  She has a history of hep C and cirrhosis.  Her MRI was consistent with a left brain stroke.  Her CT angiogram revealed a tight right carotid artery stenosis.  She went on to have a cerebral angiogram which revealed the stenosis in the right carotid artery was 60-65 percent.  The patient was ultimately discharged the hospital.  She also had intracranial 4mm aneurysms which are being managed medically  She reports that other than slurred speech and some right-sided weakness this, her symptoms have remained stable.  She has no new  symptoms.  The patient suffers from diabetes which is medically managed.  She takes a statin for hypercholesterolemia and is on an ACE inhibitor for hypertension.   PAST MEDICAL HISTORY:   Past Medical History:  Diagnosis Date  . Arthritis   . Carotid artery occlusion   . Diabetes mellitus without complication (HCC)   . Fibroid, uterine   . GERD (gastroesophageal reflux disease)   . Hepatitis    hep C  . Hypertension   . Thrombocytopenia (HCC) 12/25/2014  . Urinary incontinence    Patient noticed mild and is not currently a significant problem     FAMILY HISTORY:   Family History  Problem Relation Age of Onset  . Diabetes type II Mother   . Hypertension Mother   . Cancer Father        patient does not know what type of cancer  . Breast cancer Neg Hx     SOCIAL HISTORY:   Social History   Tobacco Use  . Smoking status: Never Smoker  . Smokeless tobacco: Never Used  Substance Use Topics  . Alcohol use: Yes   Alcohol/week: 0.6 oz    Types: 1 Cans of beer per week    Comment: 1/2 bottle of wine and some beer a day     ALLERGIES:   No Known Allergies   CURRENT MEDICATIONS:   Current Outpatient Medications  Medication Sig Dispense Refill  . amLODipine (NORVASC) 10 MG tablet Take 1 tablet (10 mg total) by mouth daily. 30 tablet 0  . ergocalciferol (VITAMIN D2) 50000 units capsule Use as directed 50,000 Units in the mouth or throat once a week.    . folic acid (FOLVITE) 1 MG tablet Take 1 tablet (1 mg total) by mouth daily. 30 tablet 0  . glucose monitoring kit (FREESTYLE) monitoring kit 1 each by Does not apply route 4 (four) times daily - after meals and at bedtime. 1 month Diabetic Testing Supplies for QAC-QHS accuchecks. Any brand OK 1 each 1  . lisinopril (PRINIVIL,ZESTRIL) 20 MG tablet Take 20 mg daily by mouth.    . metFORMIN (GLUCOPHAGE) 1000 MG tablet Take 1,000 mg by mouth 2 (two) times daily.  2  . Oxycodone HCl 10 MG TABS TAKE 1 TABLET BY MOUTH 3 TIMES A DAY AS NEEDED FOR PAIN  0  . vitamin B-12 (CYANOCOBALAMIN) 100 MCG tablet Use as directed 100 mg in the mouth or throat daily.     No current facility-administered medications for this visit.       REVIEW OF SYSTEMS:   [X] denotes positive finding, [ ] denotes negative finding Cardiac  Comments:  Chest pain or chest pressure:    Shortness of breath upon exertion: x   Short of breath when lying flat:    Irregular heart rhythm:        Vascular    Pain in calf, thigh, or hip brought on by ambulation: x   Pain in feet at night that wakes you up from your sleep:  x   Blood clot in your veins:    Leg swelling:         Pulmonary    Oxygen at home:    Productive cough:     Wheezing:         Neurologic    Sudden weakness in arms or legs:     Sudden numbness in arms or legs:     Sudden onset of difficulty speaking or slurred speech:    Temporary loss of vision in one eye:     Problems with dizziness:           Gastrointestinal    Blood in stool:     Vomited blood:         Genitourinary    Burning when urinating:     Blood in urine:        Psychiatric    Major depression:         Hematologic    Bleeding problems:    Problems with blood clotting too easily:        Skin    Rashes or ulcers:        Constitutional    Fever or chills:      PHYSICAL EXAM:   Vitals:   04/25/17 1350 04/25/17 1353  BP: (!) 208/102 (!) 207/95  Pulse: 95   Resp: 16   Temp: 99.1 F (37.3 C)   TempSrc: Oral   SpO2: 100%   Weight: 176 lb (79.8 kg)   Height: 5' 5" (1.651 m)     GENERAL: The patient is a well-nourished female, in no acute distress. The vital signs are documented above. CARDIAC: There is a regular rate and rhythm.  VASCULAR: Palpable left dorsalis pedis pulse.  I cannot palpate the right.  Bilateral carotid bruits. PULMONARY: Non-labored respirations MUSCULOSKELETAL: There are no major deformities or cyanosis. NEUROLOGIC: No focal weakness or paresthesias are detected. SKIN: There are no ulcers or rashes noted. PSYCHIATRIC: The patient has a normal affect.  STUDIES:   I have ordered and reviewed her carotid duplex.  She has a stable 60-79% right carotid stenosis in 1-39% left carotid stenosis  MEDICAL ISSUES:   Asymptomatic right carotid stenosis: I discussed the signs and symptoms of stroke with the patient.  She understands that we recommend intervention for symptomatic disease or if the stenosis becomes greater than 80%.  I reiterated that her carotid stenosis on the right is not responsible for her left brain stroke.  She will follow-up in 6 months with a repeat carotid duplex.    Wells Brabham, MD [facility] Tel [phone] Pager [phone] 

## 2017-04-26 NOTE — Addendum Note (Signed)
Addended by: Lianne Cure A on: 04/26/2017 10:07 AM   Modules accepted: Orders

## 2017-05-02 ENCOUNTER — Ambulatory Visit: Payer: Self-pay | Admitting: Physical Medicine & Rehabilitation

## 2017-05-02 ENCOUNTER — Encounter: Payer: Medicare Other | Attending: Physical Medicine & Rehabilitation

## 2017-08-11 ENCOUNTER — Other Ambulatory Visit: Payer: Self-pay | Admitting: Nurse Practitioner

## 2017-08-11 DIAGNOSIS — K7469 Other cirrhosis of liver: Secondary | ICD-10-CM

## 2017-08-22 ENCOUNTER — Encounter: Payer: Self-pay | Admitting: Nurse Practitioner

## 2017-09-08 ENCOUNTER — Ambulatory Visit (INDEPENDENT_AMBULATORY_CARE_PROVIDER_SITE_OTHER): Payer: Medicare Other | Admitting: Nurse Practitioner

## 2017-09-08 ENCOUNTER — Encounter: Payer: Self-pay | Admitting: Nurse Practitioner

## 2017-09-08 ENCOUNTER — Encounter (INDEPENDENT_AMBULATORY_CARE_PROVIDER_SITE_OTHER): Payer: Self-pay

## 2017-09-08 VITALS — BP 154/80 | HR 88 | Ht 64.25 in | Wt 176.2 lb

## 2017-09-08 DIAGNOSIS — K7460 Unspecified cirrhosis of liver: Secondary | ICD-10-CM | POA: Diagnosis not present

## 2017-09-08 DIAGNOSIS — Z1211 Encounter for screening for malignant neoplasm of colon: Secondary | ICD-10-CM

## 2017-09-08 NOTE — Progress Notes (Addendum)
Chief Complaint: needs EGD for varices screening  Referring Provider:   Roosevelt Locks, NP     ASSESSMENT AND PLAN;   37.  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  Patient referred for EGD for varices screening. -The risks and benefits of EGD were discussed and the patient agrees to proceed.  -Further cirrhosis management per [place].   2. Colon cancer screening.  She says she had a colonoscopy a year ago in [place].    - will try and assist her in getting those results just to make sure that she is in fact up-to-date on colon cancer screening  Addendum.  11/08/2017. Records received from Dr. Benson [place] Surveillance colonoscopy 01/29/2016.  Extent of exam to the cecum, excellent bowel prep.  3 mm sessile polyp removed from the transverse colon.  A few diverticula found in the sigmoid and descending colon.  Polyp pathology compatible with tubular adenoma, no HGD EGD for varices screening 01/29/2016.  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  A few tiny sessile polyps without bleeding were found in the gastric body, felt to be fundic gland polyps Colonoscopy August 2012.  A 6 4 mm sessile ascending colon polyp was removed.  Path compatible with tubular adenoma without HGD  Will forward to Dr. Henrene Pastor to determine timing of surveillance colonoscopy, three vrs five years  HPI:     Patient is a 67 year old female referred by [facility] for varices screening.  Patient has genotype 1a HCV cirrhosis, I do not know if it is complicated by portal HTN but no findings of it on CT scan in Aug 2017 at least. She does have chronic thrombocytopenia however and that could be from splenomegaly. She is status post HCV treatment with sustained response.     Candies also has a history of heavy alcohol use which could have  contributed to the cirrhosis as well.  She continues to drink a couple beers "every now and then" despite discouragement from [facility].   Jaylynne has no GI complaints.  No nausea vomiting, no blood in stools or black stools.  Bowel movements are fine. She reports a colonoscopy 1 year ago somewhere in [place].   Past Medical History:  Diagnosis Date  . Arthritis   . Carotid artery occlusion   . Cirrhosis ([place])   . Diabetes mellitus without complication ([place])   . Fibroid, uterine   . GERD (gastroesophageal reflux disease)   . Hepatitis    hep C  . Hypertension   . Stroke ([place])   . Thrombocytopenia ([place]) 12/25/2014  . Urinary incontinence    Patient noticed mild and is not currently a significant problem     Past Surgical History:  Procedure Laterality Date  . ANKLE FRACTURE SURGERY Right 1999   x 2, rod  . IR GENERIC HISTORICAL  06/30/2016   IR ANGIO VERTEBRAL SEL SUBCLAVIAN INNOMINATE UNI R MOD SED 06/30/2016 [name], MD [facility]  . IR GENERIC HISTORICAL  06/30/2016   IR ANGIO VERTEBRAL SEL VERTEBRAL UNI L MOD SED 06/30/2016 [name], MD [facility]  . IR GENERIC HISTORICAL  06/30/2016   IR ANGIO INTRA EXTRACRAN SEL COM CAROTID INNOMINATE BILAT MOD SED 06/30/2016 [name], MD [facility]  . MULTIPLE EXTRACTIONS WITH ALVEOLOPLASTY  10/25/2011   Procedure: MULTIPLE EXTRACION WITH ALVEOLOPLASTY;  Surgeon: Deborra Medina  Hoyt Koch, DDS;  Location: [facility];  Service: Oral Surgery;  Laterality: Bilateral;  Extract teeth numbers one, two, six, seven, eight, nine, ten, eleven, twelve, fifteen, sixteen, eighteen, nineteen, twenty-three, twenty-four, twenty-five, twenty-seven, thirty-two and alveoplasty.   Family History  Problem Relation Age of Onset  . Diabetes type II Mother   . Hypertension Mother   . Cancer Father        patient does not know what type of cancer  . Diabetes Daughter   . Breast cancer Neg Hx    Social History   Tobacco Use  . Smoking  status: Never Smoker  . Smokeless tobacco: Never Used  Substance Use Topics  . Alcohol use: Yes    Alcohol/week: 0.6 oz    Types: 1 Cans of beer per week    Comment: 1/2 bottle of wine and some beer a day  . Drug use: Yes    Frequency: 1.0 times per week    Types: Cocaine   Current Outpatient Medications  Medication Sig Dispense Refill  . amLODipine (NORVASC) 10 MG tablet Take 1 tablet (10 mg total) by mouth daily. 30 tablet 0  . Cholecalciferol (VITAMIN D3 PO) Take 1 tablet by mouth daily.    . metFORMIN (GLUCOPHAGE) 1000 MG tablet Take 1,000 mg by mouth 2 (two) times daily.  2  . vitamin B-12 (CYANOCOBALAMIN) 100 MCG tablet Use as directed 100 mg in the mouth or throat daily.     No current facility-administered medications for this visit.    No Known Allergies   Review of Systems: All systems reviewed and negative except where noted in HPI.    Physical Exam:    Wt Readings from Last 3 Encounters:  09/08/17 176 lb 4 oz (79.9 kg)  04/25/17 176 lb (79.8 kg)  02/16/17 175 lb (79.4 kg)    BP (!) 154/80 (BP Location: Left Arm, Patient Position: Sitting, Cuff Size: Normal)   Pulse 88   Ht 5' 4.25" (1.632 m) Comment: height measured without shoes  Wt 176 lb 4 oz (79.9 kg)   BMI 30.02 kg/m  Constitutional:  Well-developed, black female in no acute distress. Psychiatric: Normal mood and affect. Behavior is normal. EENT: Pupils normal.  Conjunctivae are normal. No scleral icterus. Neck supple.  Cardiovascular: Normal rate, regular rhythm. Soft murmur. No edema Pulmonary/chest: Effort normal and breath sounds normal. No wheezing, rales or rhonchi. Abdominal: Soft, nondistended. Nontender. Bowel sounds active throughout. There are no masses palpable. No hepatomegaly. Neurological: Alert and oriented to person place and time. Skin: Skin is warm and dry. No rashes noted.  Tye Savoy, NP  09/08/2017, 11:35 AM  Cc: Roosevelt Locks, NP

## 2017-09-08 NOTE — Patient Instructions (Addendum)
If you are age 66 or older, your body mass index should be between 23-30. Your Body mass index is 30.02 kg/m. If this is out of the aforementioned range listed, please consider follow up with your Primary Care Provider.  If you are age 2 or younger, your body mass index should be between 19-25. Your Body mass index is 30.02 kg/m. If this is out of the aformentioned range listed, please consider follow up with your Primary Care Provider.   You have been scheduled for an endoscopy. Please follow written instructions given to you at your visit today. If you use inhalers (even only as needed), please bring them with you on the day of your procedure. Your physician has requested that you go to www.startemmi.com and enter the access code given to you at your visit today. This web site gives a general overview about your procedure. However, you should still follow specific instructions given to you by our office regarding your preparation for the procedure.   Thank you for choosing [facility], Tye Savoy, NP

## 2017-09-12 NOTE — Progress Notes (Signed)
Assessment and plan reviewed 

## 2017-10-18 ENCOUNTER — Other Ambulatory Visit: Payer: Self-pay

## 2017-10-19 ENCOUNTER — Other Ambulatory Visit: Payer: Self-pay

## 2017-10-19 ENCOUNTER — Ambulatory Visit (AMBULATORY_SURGERY_CENTER): Payer: Medicare Other | Admitting: Internal Medicine

## 2017-10-19 ENCOUNTER — Encounter: Payer: Self-pay | Admitting: Internal Medicine

## 2017-10-19 VITALS — BP 159/86 | HR 89 | Temp 98.0°F | Resp 20 | Ht 64.25 in | Wt 176.0 lb

## 2017-10-19 DIAGNOSIS — I8500 Esophageal varices without bleeding: Secondary | ICD-10-CM

## 2017-10-19 DIAGNOSIS — D3A092 Benign carcinoid tumor of the stomach: Secondary | ICD-10-CM | POA: Diagnosis not present

## 2017-10-19 DIAGNOSIS — K7469 Other cirrhosis of liver: Secondary | ICD-10-CM | POA: Diagnosis not present

## 2017-10-19 DIAGNOSIS — K317 Polyp of stomach and duodenum: Secondary | ICD-10-CM

## 2017-10-19 MED ORDER — SODIUM CHLORIDE 0.9 % IV SOLN
500.0000 mL | Freq: Once | INTRAVENOUS | Status: DC
Start: 2017-10-19 — End: 2019-02-14

## 2017-10-19 NOTE — Patient Instructions (Signed)
Discharge instructions given. Biopsies taken. Resume previous medications. YOU HAD AN ENDOSCOPIC PROCEDURE TODAY AT THE [facility]:   Refer to the procedure report that was given to you for any specific questions about what was found during the examination.  If the procedure report does not answer your questions, please call your gastroenterologist to clarify.  If you requested that your care partner not be given the details of your procedure findings, then the procedure report has been included in a sealed envelope for you to review at your convenience later.  YOU SHOULD EXPECT: Some feelings of bloating in the abdomen. Passage of more gas than usual.  Walking can help get rid of the air that was put into your GI tract during the procedure and reduce the bloating. If you had a lower endoscopy (such as a colonoscopy or flexible sigmoidoscopy) you may notice spotting of blood in your stool or on the toilet paper. If you underwent a bowel prep for your procedure, you may not have a normal bowel movement for a few days.  Please Note:  You might notice some irritation and congestion in your nose or some drainage.  This is from the oxygen used during your procedure.  There is no need for concern and it should clear up in a day or so.  SYMPTOMS TO REPORT IMMEDIATELY:   Following upper endoscopy (EGD)  Vomiting of blood or coffee ground material  New chest pain or pain under the shoulder blades  Painful or persistently difficult swallowing  New shortness of breath  Fever of 100F or higher  Black, tarry-looking stools  For urgent or emergent issues, a gastroenterologist can be reached at any hour by calling [phone].   DIET:  We do recommend a small meal at first, but then you may proceed to your regular diet.  Drink plenty of fluids but you should avoid alcoholic beverages for 24 hours.  ACTIVITY:  You should plan to take it easy for the rest of today and you should NOT DRIVE  or use heavy machinery until tomorrow (because of the sedation medicines used during the test).    FOLLOW UP: Our staff will call the number listed on your records the next business day following your procedure to check on you and address any questions or concerns that you may have regarding the information given to you following your procedure. If we do not reach you, we will leave a message.  However, if you are feeling well and you are not experiencing any problems, there is no need to return our call.  We will assume that you have returned to your regular daily activities without incident.  If any biopsies were taken you will be contacted by phone or by letter within the next 1-3 weeks.  Please call us at [phone] if you have not heard about the biopsies in 3 weeks.    SIGNATURES/CONFIDENTIALITY: You and/or your care partner have signed paperwork which will be entered into your electronic medical record.  These signatures attest to the fact that that the information above on your After Visit Summary has been reviewed and is understood.  Full responsibility of the confidentiality of this discharge information lies with you and/or your care-partner. 

## 2017-10-19 NOTE — Progress Notes (Signed)
Called to room to assist during endoscopic procedure.  Patient ID and intended procedure confirmed with present staff. Received instructions for my participation in the procedure from the performing physician.  

## 2017-10-19 NOTE — Progress Notes (Signed)
No egg or soy allergy known to patient  No issues with past sedation with any surgeries  or procedures, no intubation problems  No diet pills per patient No home 02 use per patient  No blood thinners per patient  Pt denies issues with constipation   

## 2017-10-19 NOTE — Op Note (Signed)
[name] Patient Name: Deborah Wise Procedure Date: 10/19/2017 9:58 AM MRN: [id number] Endoscopist: Docia Chuck. Henrene Pastor , MD Age: 67 Referring MD:  Date of Birth: [birth year] Gender: Female Account #: [id number] Procedure:                Upper GI endoscopy, with biopsies Indications:              Cirrhosis rule out esophageal varices Medicines:                Monitored Anesthesia Care Procedure:                Pre-Anesthesia Assessment:                           - Prior to the procedure, a History and Physical                            was performed, and patient medications and                            allergies were reviewed. The patient's tolerance of                            previous anesthesia was also reviewed. The risks                            and benefits of the procedure and the sedation                            options and risks were discussed with the patient.                            All questions were answered, and informed consent                            was obtained. Prior Anticoagulants: The patient has                            taken no previous anticoagulant or antiplatelet                            agents. ASA Grade Assessment: III - A patient with                            severe systemic disease. After reviewing the risks                            and benefits, the patient was deemed in                            satisfactory condition to undergo the procedure.                           After obtaining informed consent, the endoscope was  passed under direct vision. Throughout the                            procedure, the patient's blood pressure, pulse, and                            oxygen saturations were monitored continuously. The                            Endoscope was introduced through the mouth, and                            advanced to the second part of duodenum. The upper    GI endoscopy was accomplished without difficulty.                            The patient tolerated the procedure well. Scope In: Scope Out: Findings:                 Small (< 5 mm) varices were found in the lower                            third of the esophagus.                           A few pedunculated polyps were found in the gastric                            antrum and body. Biopsies were taken with a cold                            forceps for histology.                           The stomach was Otherwise normal                           The examined duodenum was normal.                           The cardia and gastric fundus were normal on                            retroflexion. Complications:            No immediate complications. Estimated Blood Loss:     Estimated blood loss: none. Impression:               - Trace esophageal varices.                           - A few gastric polyps. Biopsied.                           - Normal stomach therwise.                           -  Normal examined duodenum. Recommendation:           - Patient has a contact number available for                            emergencies. The signs and symptoms of potential                            delayed complications were discussed with the                            patient. Return to normal activities tomorrow.                            Written discharge instructions were provided to the                            patient.                           - Resume previous diet.                           - Continue present medications.                           - Await pathology results.                           - Repeat upper endoscopy in 1 year for surveillance.                           - Return to the care of your primary providers Docia Chuck. Henrene Pastor, MD 10/19/2017 10:15:59 AM This report has been signed electronically.

## 2017-10-19 NOTE — Progress Notes (Signed)
Report given to PACU, vss 

## 2017-10-19 NOTE — Progress Notes (Signed)
Pt took her metformin this morning.Notified CRNA Elizabeth Palau.

## 2017-10-20 ENCOUNTER — Telehealth: Payer: Self-pay

## 2017-10-20 ENCOUNTER — Telehealth: Payer: Self-pay | Admitting: *Deleted

## 2017-10-20 NOTE — Telephone Encounter (Signed)
  Follow up Call-  Call back number 10/19/2017  Post procedure Call Back phone  # [phone]  Permission to leave phone message Yes  Some recent data might be hidden     Patient questions:  Message left for patient to call us if necessary.

## 2017-10-20 NOTE — Telephone Encounter (Signed)
  Follow up Call-  Call back number 10/19/2017  Post procedure Call Back phone  # [phone]  Permission to leave phone message Yes  Some recent data might be hidden     Wrong id on answering machine. Angela/2nd attempt call-back

## 2017-10-24 ENCOUNTER — Ambulatory Visit (HOSPITAL_COMMUNITY)
Admission: RE | Admit: 2017-10-24 | Discharge: 2017-10-24 | Disposition: A | Discharge status: Home / Self Care | Payer: Medicare Other | Source: Ambulatory Visit | Attending: Surgery | Admitting: Surgery

## 2017-10-24 ENCOUNTER — Encounter: Payer: Self-pay | Admitting: Family

## 2017-10-24 ENCOUNTER — Ambulatory Visit (INDEPENDENT_AMBULATORY_CARE_PROVIDER_SITE_OTHER): Payer: Medicare Other | Admitting: Family

## 2017-10-24 VITALS — BP 186/82 | HR 86 | Temp 98.3°F | Resp 18 | Ht 65.0 in | Wt 176.4 lb

## 2017-10-24 DIAGNOSIS — I6521 Occlusion and stenosis of right carotid artery: Secondary | ICD-10-CM

## 2017-10-24 DIAGNOSIS — I6523 Occlusion and stenosis of bilateral carotid arteries: Secondary | ICD-10-CM | POA: Diagnosis not present

## 2017-10-24 NOTE — Progress Notes (Signed)
Chief Complaint: Follow up Extracranial Carotid Artery Stenosis   History of Present Illness  Deborah Wise is a 67 y.o. female who initially presented to the hospital in January 2018 with slurred speech and right-sided weakness.  Her UDS was positive for cocaine. She has a history of hep C and cirrhosis. Her MRI was consistent with a left brain stroke. Her CT angiogram revealed a tight right carotid artery stenosis. She went on to have a cerebral angiogram which revealed the stenosis in the right carotid artery was 60-65 percent. The patient was ultimately discharged the hospital.  She also had intracranial 5mm aneurysms which are being managed medically.  Dr. Trula Slade last evaluated pt on 04-25-17. At that time carotid duplex demonstrated a stable 60-79% right carotid stenosis and 1-39% left carotid stenosis Asymptomatic right carotid stenosis: Dr. Trula Slade discussed with pt the signs and symptoms of stroke.  She understands that we recommend intervention for symptomatic disease or if the stenosis becomes greater than 80%.  Dr. Trula Slade reiterated that her carotid stenosis on the right is not responsible for her left brain stroke. She was to follow-up in 6 months with a repeat carotid duplex.  She reports that other than slurred speech and some right-sided weakness, her symptoms have remained stable. She has no new  symptoms. She states she feels like she lists to the right at times when walking.   Pt denies dyspnea or chest pain, denies headache, denies feeling light headed.   Pt states she cannot afford to see her PCP as she owes money. I advised her to see her PCP re her uncontrolled blood pressure.   She states the edge or a car door caused what appears to be a bruise and shallow laceration on her left cheek, and that she tripped over a cord and her left knee feels slightly painful and swollen.  ETOH use: she admits to sips of beer and "a little drink" daily.   Diabetic: yes,  last A1C result on file was 5.4 on 06-28-16 Tobacco use: non-smoker  Pt meds include: Statin : no ASA: no Other anticoagulants/antiplatelets: no   Past Medical History:  Diagnosis Date  . Arthritis   . Carotid artery occlusion   . Cirrhosis ([place])   . Diabetes mellitus without complication ([place])   . Fibroid, uterine   . GERD (gastroesophageal reflux disease)   . Hepatitis    hep C  . Hypertension   . Stroke ([place])   . Thrombocytopenia ([place]) 12/25/2014  . Urinary incontinence    Patient noticed mild and is not currently a significant problem    Social History Social History   Tobacco Use  . Smoking status: Never Smoker  . Smokeless tobacco: Never Used  Substance Use Topics  . Alcohol use: Yes    Alcohol/week: 0.6 oz    Types: 1 Cans of beer per week    Comment: 1/2 bottle of wine and some beer a day  . Drug use: Not Currently    Frequency: 1.0 times per week    Types: Cocaine    Comment: last use cocaine last year    Family History Family History  Problem Relation Age of Onset  . Diabetes type II Mother   . Hypertension Mother   . Cancer Father        patient does not know what type of cancer  . Diabetes Daughter   . Breast cancer Neg Hx   . Colon polyps Neg Hx   . Crohn's disease  Neg Hx   . Esophageal cancer Neg Hx   . Rectal cancer Neg Hx   . Stomach cancer Neg Hx     Surgical History Past Surgical History:  Procedure Laterality Date  . ANKLE FRACTURE SURGERY Right 1999   x 2, rod  . IR GENERIC HISTORICAL  06/30/2016   IR ANGIO VERTEBRAL SEL SUBCLAVIAN INNOMINATE UNI R MOD SED 06/30/2016 [name], MD [facility]  . IR GENERIC HISTORICAL  06/30/2016   IR ANGIO VERTEBRAL SEL VERTEBRAL UNI L MOD SED 06/30/2016 [name], MD [facility]  . IR GENERIC HISTORICAL  06/30/2016   IR ANGIO INTRA EXTRACRAN SEL COM CAROTID INNOMINATE BILAT MOD SED 06/30/2016 [name], MD [facility]  . MULTIPLE EXTRACTIONS WITH ALVEOLOPLASTY  10/25/2011    Procedure: MULTIPLE EXTRACION WITH ALVEOLOPLASTY;  Surgeon: Gae Bon, DDS;  Location: [place];  Service: Oral Surgery;  Laterality: Bilateral;  Extract teeth numbers one, two, six, seven, eight, nine, ten, eleven, twelve, fifteen, sixteen, eighteen, nineteen, twenty-three, twenty-four, twenty-five, twenty-seven, thirty-two and alveoplasty.    No Known Allergies  Current Outpatient Medications  Medication Sig Dispense Refill  . ACCU-CHEK SOFTCLIX LANCETS lancets Accu-Chek Softclix Lancets  USE AS DIRECTED TO CHECK BLOOD SUGAR TWICE A DAY    . amLODipine (NORVASC) 10 MG tablet Take 1 tablet (10 mg total) by mouth daily. 30 tablet 0  . Cholecalciferol (VITAMIN D3 PO) Take 1 tablet by mouth daily.    [name] Kitchen glucose blood test strip Accu-Chek Aviva Plus test strips  USE TO CHECK BLOOD SUGAR TWICE A DAY    . meloxicam (MOBIC) 15 MG tablet meloxicam 15 mg tablet  TAKE 1 TABLET BY MOUTH EVERY DAY    . metFORMIN (GLUCOPHAGE) 1000 MG tablet Take 1,000 mg by mouth 2 (two) times daily.  2  . vitamin B-12 (CYANOCOBALAMIN) 100 MCG tablet Use as directed 100 mg in the mouth or throat daily.     Current Facility-Administered Medications  Medication Dose Route Frequency Provider Last Rate Last Dose  . 0.9 %  sodium chloride infusion  500 mL Intravenous Once Irene Shipper, MD        Review of Systems : See HPI for pertinent positives and negatives.  Physical Examination  Vitals:   10/24/17 1556 10/24/17 1559  BP: (!) 185/88 (!) 186/82  Pulse: 86   Resp: 18   SpO2: 96%   Weight: 176 lb 6.4 oz (80 kg)   Height: 5\' 5"  (1.651 m)    Body mass index is 29.35 kg/m.  General: WDWN female in NAD GAIT: lists slightly to the right, steady.  Eyes: PERRLA HENT: No gross abnormalities.  Pulmonary:  Respirations are non-labored, good air movement, CTAB, no rales, rhonchi, or wheezing. Cardiac: regular rhythm, no detected murmur.  VASCULAR EXAM Carotid Bruits Right Left   Positive Negative      Abdominal aortic pulse is not palpable. Radial pulses are 2+ palpable and equal.  LE Pulses Right Left       POPLITEAL  not palpable   not palpable       POSTERIOR TIBIAL  not palpable   2+ palpable        DORSALIS PEDIS      ANTERIOR TIBIAL not palpable  1+ palpable     Gastrointestinal: soft, nontender, BS WNL, no r/g, no palpable masses. Musculoskeletal:No muscle atrophy/wasting. M/S 5/5 in left upper and lower extremities, 4.5/5 in right upper and lower extremities, extremities without ischemic changes. No swelling noted in left knee, no open wounds.  Skin: No rashes, no ulcers, no cellulitis. Small dry dressing on left facial cheek.  Neurologic:  A&O X 3; appropriate affect, sensation is normal; speech is normal, CN 2-12 intact, pain and light touch intact in extremities, motor exam as listed above. Psychiatric: Normal thought content, mood appropriate to clinical situation.    Assessment: Deborah Wise is a 67 y.o. female who presented to the hospital in January 2018 with slurred speech and right-sided weakness.  Her MRI was consistent with a left brain stroke. Her CT angiogram revealed a tight right carotid artery stenosis. She went on to have a cerebral angiogram which revealed the stenosis in the right carotid artery was 60-65 percent.   She denies any known subsequent stroke or TIA.   I advised her to see her PCP as soon as possible re her uncontrolled hypertension.    DATA Carotid Duplex (10/24/17): Right ICA: 60-79% stenosis. Right ECA: >50% stenosis Left ICA: 1-39% stenosis Bilateral vertebral artery flow is antegrade.  Bilateral subclavian artery waveforms are normal.  No significant change compared to the exam on 04-25-17.    Plan: Follow-up in 6 months with Carotid Duplex scan.   I discussed in depth with the patient the nature of  atherosclerosis, and emphasized the importance of maximal medical management including strict control of blood pressure, blood glucose, and lipid levels, obtaining regular exercise, and continued cessation of smoking.  The patient is aware that without maximal medical management the underlying atherosclerotic disease process will progress, limiting the benefit of any interventions. The patient was given information about stroke prevention and what symptoms should prompt the patient to seek immediate medical care. Thank you for allowing [place] to participate in this patient's care.  Clemon Chambers, RN, MSN, FNP-C [facility] Office: [facility] Physician: Trula Slade  10/24/17 4:00 PM

## 2017-10-24 NOTE — Patient Instructions (Signed)

## 2017-10-25 ENCOUNTER — Encounter: Payer: Self-pay | Admitting: Internal Medicine

## 2017-11-08 ENCOUNTER — Ambulatory Visit
Admission: RE | Admit: 2017-11-08 | Discharge: 2017-11-08 | Disposition: A | Discharge status: Home / Self Care | Payer: Medicare Other | Source: Ambulatory Visit | Attending: Nurse Practitioner | Admitting: Nurse Practitioner

## 2017-11-08 DIAGNOSIS — K7469 Other cirrhosis of liver: Secondary | ICD-10-CM

## 2017-11-10 ENCOUNTER — Telehealth: Payer: Self-pay

## 2017-11-10 NOTE — Telephone Encounter (Signed)
-----   Message from Willia Craze, NP sent at 11/10/2017  2:27 PM EDT ----- Eustaquio Maize, colon report reviewed . Please let patient know that she should have repeat colon for polyp surveillance in aug 2022. Please put on recall list.Thanks

## 2017-11-10 NOTE — Telephone Encounter (Signed)
Patient notified. Recall entered into EPIC

## 2017-11-14 ENCOUNTER — Other Ambulatory Visit: Payer: Self-pay

## 2017-11-14 DIAGNOSIS — I6523 Occlusion and stenosis of bilateral carotid arteries: Secondary | ICD-10-CM

## 2018-04-05 ENCOUNTER — Other Ambulatory Visit: Payer: Self-pay | Admitting: Nurse Practitioner

## 2018-04-05 DIAGNOSIS — K7469 Other cirrhosis of liver: Secondary | ICD-10-CM

## 2018-04-11 ENCOUNTER — Ambulatory Visit (HOSPITAL_COMMUNITY): Payer: Medicare Other

## 2018-04-11 ENCOUNTER — Ambulatory Visit: Payer: Medicare Other | Admitting: Family

## 2018-04-12 ENCOUNTER — Ambulatory Visit
Admission: RE | Admit: 2018-04-12 | Discharge: 2018-04-12 | Disposition: A | Discharge status: Home / Self Care | Payer: Medicare Other | Source: Ambulatory Visit | Attending: Nurse Practitioner | Admitting: Nurse Practitioner

## 2018-04-12 DIAGNOSIS — K7469 Other cirrhosis of liver: Secondary | ICD-10-CM

## 2018-05-26 ENCOUNTER — Ambulatory Visit (INDEPENDENT_AMBULATORY_CARE_PROVIDER_SITE_OTHER): Payer: Medicare Other | Admitting: Family

## 2018-05-26 ENCOUNTER — Encounter: Payer: Self-pay | Admitting: Family

## 2018-05-26 ENCOUNTER — Ambulatory Visit (HOSPITAL_COMMUNITY)
Admission: RE | Admit: 2018-05-26 | Discharge: 2018-05-26 | Disposition: A | Discharge status: Home / Self Care | Payer: Medicare Other | Source: Ambulatory Visit | Attending: Family | Admitting: Family

## 2018-05-26 VITALS — BP 174/79 | HR 77 | Temp 97.0°F | Resp 14 | Ht 65.0 in | Wt 186.0 lb

## 2018-05-26 DIAGNOSIS — I6523 Occlusion and stenosis of bilateral carotid arteries: Secondary | ICD-10-CM

## 2018-05-26 DIAGNOSIS — Z8673 Personal history of transient ischemic attack (TIA), and cerebral infarction without residual deficits: Secondary | ICD-10-CM

## 2018-05-26 NOTE — Patient Instructions (Signed)

## 2018-05-26 NOTE — Progress Notes (Addendum)
Chief Complaint: Follow up Extracranial Carotid Artery Stenosis   History of Present Illness  Zanaya Baize is a 67 y.o. female who initially presented to the hospital in January 2018 with slurred speech and right-sided weakness.  Her UDS was positive for cocaine. She has a history of hep C and cirrhosis. Her MRI was consistent with a left brain stroke. Her CT angiogram revealed a tight right carotid artery stenosis. She went on to have a cerebral angiogram which revealed the stenosis in the right carotid artery was 60-65 percent. The patient was ultimately discharged the hospital.She also had intracranial 79mm aneurysms which are being managed medically.  Dr. Trula Slade last evaluated pt on 04-25-17. At that time carotid duplex demonstrated a stable 60-79% right carotid stenosis and 1-39% left carotid stenosis Asymptomatic right carotid stenosis: Dr. Trula Slade discussed with pt the signs and symptoms of stroke. She understands that we recommend intervention for symptomatic disease or if the stenosis becomes greater than 80%. Dr. Trula Slade reiterated that her carotid stenosis on the right is not responsible for her left brain stroke. She was to follow-up in 6 months with a repeat carotid duplex.  She reports that other than slurred speech and some right-sided weakness, her symptoms have remained stable. She has nonewsymptoms. She states she feels like she lists to the right at times when walking.   Pt denies dyspnea or chest pain, denies headache, denies feeling light headed.   At a previous visit pt stated she could not afford to see her PCP as she owes money. At that time I advised her to see her PCP re her uncontrolled blood pressure. She sees [facility] instead, last saw on 05-25-18. She states she needs a PCP.  ETOH use: she admits to sips of beer and "a little drink" daily.   Diabetic: yes, last A1C result on file was 5.4 on 06-28-16 Tobacco use:  non-smoker  Pt meds include: Statin : no ASA: no Other anticoagulants/antiplatelets: no   Past Medical History:  Diagnosis Date  . Arthritis   . Carotid artery occlusion   . Cirrhosis ([place])   . Diabetes mellitus without complication ([place])   . Fibroid, uterine   . GERD (gastroesophageal reflux disease)   . Hepatitis    hep C  . Hypertension   . Stroke ([place])   . Thrombocytopenia ([place]) 12/25/2014  . Urinary incontinence    Patient noticed mild and is not currently a significant problem    Social History Social History   Tobacco Use  . Smoking status: Never Smoker  . Smokeless tobacco: Never Used  Substance Use Topics  . Alcohol use: Yes    Alcohol/week: 1.0 standard drinks    Types: 1 Cans of beer per week    Comment: 1/2 bottle of wine and some beer a day  . Drug use: Not Currently    Frequency: 1.0 times per week    Types: Cocaine    Comment: last use cocaine last year    Family History Family History  Problem Relation Age of Onset  . Diabetes type II Mother   . Hypertension Mother   . Cancer Father        patient does not know what type of cancer  . Diabetes Daughter   . Breast cancer Neg Hx   . Colon polyps Neg Hx   . Crohn's disease Neg Hx   . Esophageal cancer Neg Hx   . Rectal cancer Neg Hx   . Stomach cancer  Neg Hx     Surgical History Past Surgical History:  Procedure Laterality Date  . ANKLE FRACTURE SURGERY Right 1999   x 2, rod  . IR GENERIC HISTORICAL  06/30/2016   IR ANGIO VERTEBRAL SEL SUBCLAVIAN INNOMINATE UNI R MOD SED 06/30/2016 [name], MD [facility]  . IR GENERIC HISTORICAL  06/30/2016   IR ANGIO VERTEBRAL SEL VERTEBRAL UNI L MOD SED 06/30/2016 [name], MD [facility]  . IR GENERIC HISTORICAL  06/30/2016   IR ANGIO INTRA EXTRACRAN SEL COM CAROTID INNOMINATE BILAT MOD SED 06/30/2016 [name], MD [facility]  . MULTIPLE EXTRACTIONS WITH ALVEOLOPLASTY  10/25/2011   Procedure: MULTIPLE EXTRACION WITH  ALVEOLOPLASTY;  Surgeon: Gae Bon, DDS;  Location: [place];  Service: Oral Surgery;  Laterality: Bilateral;  Extract teeth numbers one, two, six, seven, eight, nine, ten, eleven, twelve, fifteen, sixteen, eighteen, nineteen, twenty-three, twenty-four, twenty-five, twenty-seven, thirty-two and alveoplasty.    No Known Allergies  Current Outpatient Medications  Medication Sig Dispense Refill  . ACCU-CHEK SOFTCLIX LANCETS lancets Accu-Chek Softclix Lancets  USE AS DIRECTED TO CHECK BLOOD SUGAR TWICE A DAY    . amLODipine (NORVASC) 10 MG tablet Take 1 tablet (10 mg total) by mouth daily. 30 tablet 0  . Cholecalciferol (VITAMIN D3 PO) Take 1 tablet by mouth daily.    [name] Kitchen glucose blood test strip Accu-Chek Aviva Plus test strips  USE TO CHECK BLOOD SUGAR TWICE A DAY    . hydrochlorothiazide (HYDRODIURIL) 12.5 MG tablet     . meloxicam (MOBIC) 15 MG tablet meloxicam 15 mg tablet  TAKE 1 TABLET BY MOUTH EVERY DAY    . metFORMIN (GLUCOPHAGE) 1000 MG tablet Take 1,000 mg by mouth 2 (two) times daily.  2  . vitamin B-12 (CYANOCOBALAMIN) 100 MCG tablet Use as directed 100 mg in the mouth or throat daily.     Current Facility-Administered Medications  Medication Dose Route Frequency Provider Last Rate Last Dose  . 0.9 %  sodium chloride infusion  500 mL Intravenous Once Irene Shipper, MD        Review of Systems : See HPI for pertinent positives and negatives.  Physical Examination  Vitals:   05/26/18 1147  BP: (!) 174/79  Pulse: 77  Resp: 14  Temp: (!) 97 F (36.1 C)  SpO2: 99%  Weight: 186 lb (84.4 kg)  Height: 5\' 5"  (1.651 m)   Body mass index is 30.95 kg/m.  General: WDWN obese female in NAD GAIT: lists slightly to the right, steady.  Eyes: PERRLA HENT: No gross abnormalities.  Pulmonary:  Respirations are non-labored, good air movement, CTAB, no rales, rhonchi, or wheezing. Cardiac: regular rhythm, no detected murmur.  VASCULAR EXAM Carotid Bruits Right Left   Positive  Negative     Abdominal aortic pulse is not palpable. Radial pulses are 2+ palpable and equal.  LE Pulses Right Left       POPLITEAL  not palpable   not palpable       POSTERIOR TIBIAL  not palpable   2+ palpable        DORSALIS PEDIS      ANTERIOR TIBIAL not palpable  1+ palpable     Gastrointestinal: soft, nontender, BS WNL, no r/g, no palpable masses. Musculoskeletal:No muscle atrophy/wasting. M/S 5/5 in left upper and lower extremities, 4.5/5 in right upper and lower extremities, extremities without ischemic changes. No swelling noted in left knee, no open wounds.  Skin: No rashes, no ulcers, no cellulitis.  Neurologic:  A&O X 3; appropriate affect, sensation is normal; speech is normal, CN 2-12 intact except right corner of mouth has moderate droop, pain and light touch intact in extremities, motor exam as listed above. Psychiatric: Normal thought content, mood appropriate to clinical situation   Assessment: Doylene Splinter is a 67 y.o. female who who presented to the hospital in January 2018 with slurred speech and right-sided weakness.  Her MRI was consistent with a left brain stroke. Her CT angiogram revealed a tight right carotid artery stenosis. She went on to have a cerebral angiogram which revealed the stenosis in the right carotid artery was 60-65 percent.   She had some recurrent right side weakness an falling form this about November 2019, along with slurred speech. She also admits to daily ETOH use. Has had + UDS for cocaine at ED visit in January 2018.  Her blood pressure is in better control today than at previous visits, still uncontrolled.   I discussed with Dr. Donzetta Matters pt sx's last month, which does not correspond to right ICA stenosis of 60-79%.  See Plan.   DATA  Carotid Duplex (05-26-18): Right Carotid:  Velocities in the right ICA are consistent with a 60-79%                stenosis. The ECA appears >50% stenosed. Left Carotid: Velocities in the left ICA are consistent with a 1-39% stenosis. Vertebrals:  Bilateral vertebral arteries demonstrate antegrade flow. Subclavians: Normal flow hemodynamics were seen in bilateral subclavian              arteries. No change compared to the exams on 04-25-18 and 10-24-17.   Plan:  Refer to PCP that pt's insurance will cover, at pt request.   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   Follow-up in 6 months with Carotid Duplex scan.    I discussed in depth with the patient the nature of atherosclerosis, and emphasized the importance of maximal medical management including strict control of blood pressure, blood glucose, and lipid levels, obtaining regular exercise, and continued cessation of smoking.  The patient is aware that without maximal medical management the underlying atherosclerotic disease process will progress, limiting the benefit of any interventions. The patient was given information about stroke prevention and what symptoms should prompt the patient to seek immediate medical care. Thank you for allowing [place] to participate in this patient's care.  Clemon Chambers, RN, MSN, FNP-C [facility] Office: [phone]  Clinic Physician: Donzetta Matters  05/26/18 12:32 PM

## 2018-06-22 ENCOUNTER — Other Ambulatory Visit: Payer: Self-pay | Admitting: Internal Medicine

## 2018-06-22 ENCOUNTER — Ambulatory Visit (INDEPENDENT_AMBULATORY_CARE_PROVIDER_SITE_OTHER): Payer: Medicare Other | Admitting: Internal Medicine

## 2018-06-22 ENCOUNTER — Encounter: Payer: Self-pay | Admitting: Internal Medicine

## 2018-06-22 VITALS — BP 190/88 | HR 88 | Temp 98.1°F | Ht 64.5 in | Wt 188.6 lb

## 2018-06-22 DIAGNOSIS — I639 Cerebral infarction, unspecified: Secondary | ICD-10-CM

## 2018-06-22 DIAGNOSIS — K7030 Alcoholic cirrhosis of liver without ascites: Secondary | ICD-10-CM

## 2018-06-22 DIAGNOSIS — E1149 Type 2 diabetes mellitus with other diabetic neurological complication: Secondary | ICD-10-CM

## 2018-06-22 DIAGNOSIS — F1410 Cocaine abuse, uncomplicated: Secondary | ICD-10-CM

## 2018-06-22 DIAGNOSIS — K739 Chronic hepatitis, unspecified: Secondary | ICD-10-CM

## 2018-06-22 DIAGNOSIS — K7460 Unspecified cirrhosis of liver: Secondary | ICD-10-CM | POA: Insufficient documentation

## 2018-06-22 DIAGNOSIS — E119 Type 2 diabetes mellitus without complications: Secondary | ICD-10-CM

## 2018-06-22 DIAGNOSIS — Z23 Encounter for immunization: Secondary | ICD-10-CM | POA: Diagnosis not present

## 2018-06-22 DIAGNOSIS — I779 Disorder of arteries and arterioles, unspecified: Secondary | ICD-10-CM | POA: Insufficient documentation

## 2018-06-22 DIAGNOSIS — R011 Cardiac murmur, unspecified: Secondary | ICD-10-CM

## 2018-06-22 DIAGNOSIS — F1010 Alcohol abuse, uncomplicated: Secondary | ICD-10-CM | POA: Diagnosis not present

## 2018-06-22 DIAGNOSIS — D509 Iron deficiency anemia, unspecified: Secondary | ICD-10-CM

## 2018-06-22 DIAGNOSIS — I739 Peripheral vascular disease, unspecified: Secondary | ICD-10-CM

## 2018-06-22 DIAGNOSIS — E785 Hyperlipidemia, unspecified: Secondary | ICD-10-CM

## 2018-06-22 DIAGNOSIS — I10 Essential (primary) hypertension: Secondary | ICD-10-CM

## 2018-06-22 DIAGNOSIS — E1169 Type 2 diabetes mellitus with other specified complication: Secondary | ICD-10-CM | POA: Insufficient documentation

## 2018-06-22 DIAGNOSIS — I6521 Occlusion and stenosis of right carotid artery: Secondary | ICD-10-CM

## 2018-06-22 DIAGNOSIS — I6350 Cerebral infarction due to unspecified occlusion or stenosis of unspecified cerebral artery: Secondary | ICD-10-CM

## 2018-06-22 LAB — CBC WITH DIFFERENTIAL/PLATELET
Basophils Absolute: 0 10*3/uL (ref 0.0–0.1)
Basophils Relative: 0.3 % (ref 0.0–3.0)
Eosinophils Absolute: 0 10*3/uL (ref 0.0–0.7)
Eosinophils Relative: 0.7 % (ref 0.0–5.0)
HCT: 37.1 % (ref 36.0–46.0)
Hemoglobin: 12.1 g/dL (ref 12.0–15.0)
Lymphocytes Relative: 40.8 % (ref 12.0–46.0)
Lymphs Abs: 2.7 10*3/uL (ref 0.7–4.0)
MCHC: 32.6 g/dL (ref 30.0–36.0)
MCV: 73.7 fl — ABNORMAL LOW (ref 78.0–100.0)
Monocytes Absolute: 0.7 10*3/uL (ref 0.1–1.0)
Monocytes Relative: 10.9 % (ref 3.0–12.0)
Neutro Abs: 3.1 10*3/uL (ref 1.4–7.7)
Neutrophils Relative %: 47.3 % (ref 43.0–77.0)
Platelets: 133 10*3/uL — ABNORMAL LOW (ref 150.0–400.0)
RBC: 5.04 Mil/uL (ref 3.87–5.11)
RDW: 14.7 % (ref 11.5–15.5)
WBC: 6.6 10*3/uL (ref 4.0–10.5)

## 2018-06-22 LAB — COMPREHENSIVE METABOLIC PANEL
ALT: 31 U/L (ref 0–35)
AST: 34 U/L (ref 0–37)
Albumin: 4.2 g/dL (ref 3.5–5.2)
Alkaline Phosphatase: 89 U/L (ref 39–117)
BUN: 18 mg/dL (ref 6–23)
CO2: 27 mEq/L (ref 19–32)
Calcium: 9.9 mg/dL (ref 8.4–10.5)
Chloride: 103 mEq/L (ref 96–112)
Creatinine, Ser: 0.73 mg/dL (ref 0.40–1.20)
GFR: 102.46 mL/min (ref >60.00)
Glucose, Bld: 134 mg/dL — ABNORMAL HIGH (ref 70–99)
Potassium: 4.4 mEq/L (ref 3.5–5.1)
Sodium: 138 mEq/L (ref 135–145)
Total Bilirubin: 1.2 mg/dL (ref 0.2–1.2)
Total Protein: 7.7 g/dL (ref 6.0–8.3)

## 2018-06-22 LAB — LIPID PANEL
Cholesterol: 232 mg/dL — ABNORMAL HIGH (ref 0–200)
HDL: 49.9 mg/dL (ref >39.00)
LDL Cholesterol: 166 mg/dL — ABNORMAL HIGH (ref 0–99)
NonHDL: 182.31
Total CHOL/HDL Ratio: 5
Triglycerides: 84 mg/dL (ref 0.0–149.0)
VLDL: 16.8 mg/dL (ref 0.0–40.0)

## 2018-06-22 LAB — MICROALBUMIN / CREATININE URINE RATIO
Creatinine,U: 57.5 mg/dL
Microalb Creat Ratio: 91.8 mg/g — ABNORMAL HIGH (ref 0.0–30.0)
Microalb, Ur: 52.8 mg/dL — ABNORMAL HIGH (ref 0.0–1.9)

## 2018-06-22 MED ORDER — LISINOPRIL 20 MG PO TABS: 20.0000 mg | ORAL_TABLET | Freq: Every day | ORAL | 3 refills | Status: DC

## 2018-06-22 MED ORDER — ASPIRIN EC 81 MG PO TBEC: 81.0000 mg | DELAYED_RELEASE_TABLET | Freq: Every day | ORAL | 11 refills | Status: DC

## 2018-06-22 MED ORDER — ATORVASTATIN CALCIUM 80 MG PO TABS: 80.0000 mg | ORAL_TABLET | Freq: Every day | ORAL | 3 refills | Status: DC

## 2018-06-22 NOTE — Patient Instructions (Addendum)
-  It was nice meeting you today!  -Lab work today. We will notify you when results are available.  -Start taking aspirin 81 mg daily.  -Your blood pressure is extremely high at 200/100! Start taking lisinopril 20 mg daily in addition to amlodipine 10 mg daily and HCTZ 12.5 mg daily.  -Refrain from cocaine use, decrease alcohol intake.  -Make sure you schedule an eye exam soon.  -Schedule follow up in 1 week for BP check.

## 2018-06-22 NOTE — Progress Notes (Signed)
New Patient Office Visit     CC/Reason for Visit: Establish care follow-up on chronic medical conditions  HPI: Deborah Wise is a 68 y.o. female who is coming in today for the above mentioned reasons.  She is due for her initial Medicare wellness visit and her annual physical exam.  Past Medical History is extensive.  Her major comorbidities are prior history of CVA in January 2018 not on antiplatelet therapy, very uncontrolled hypertension, type 2 diabetes well controlled on metformin, hyperlipidemia not on statin therapy.  History of alcohol and polysubstance abuse including cocaine and marijuana.  She has a history of carotid artery disease and follows up every 6 months with vascular surgery.  She was last seen by vascular surgery last week found to have elevated blood pressure and asked to take hydrochlorothiazide 12.5 mg.  This is in addition to the amlodipine 10 mg that she is taking.  Blood pressure in the office today is 200/100.  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  She has a history of thrombocytopenia presumably related to her cirrhosis and splenomegaly.  She has no acute complaints today, is here mainly to establish care.  Past Medical/Surgical History: Past Medical History:  Diagnosis Date  . Arthritis   . Carotid artery occlusion   . Cirrhosis ([place])   . Diabetes mellitus without complication ([place])   . Fibroid, uterine   . GERD (gastroesophageal reflux disease)   . Hepatitis    hep C  . Hypertension   . Stroke ([place])   . Thrombocytopenia ([place]) 12/25/2014  . Urinary incontinence    Patient noticed mild and is not currently a significant problem    Past Surgical History:  Procedure Laterality Date  . ANKLE FRACTURE SURGERY Right 1999   x 2, rod  . IR GENERIC HISTORICAL  06/30/2016   IR ANGIO VERTEBRAL SEL SUBCLAVIAN INNOMINATE UNI R MOD SED 06/30/2016 [name], MD [facility]  . IR GENERIC  HISTORICAL  06/30/2016   IR ANGIO VERTEBRAL SEL VERTEBRAL UNI L MOD SED 06/30/2016 [name], MD [facility]  . IR GENERIC HISTORICAL  06/30/2016   IR ANGIO INTRA EXTRACRAN SEL COM CAROTID INNOMINATE BILAT MOD SED 06/30/2016 [name], MD [facility]  . MULTIPLE EXTRACTIONS WITH ALVEOLOPLASTY  10/25/2011   Procedure: MULTIPLE EXTRACION WITH ALVEOLOPLASTY;  Surgeon: Gae Bon, DDS;  Location: [facility];  Service: Oral Surgery;  Laterality: Bilateral;  Extract teeth numbers one, two, six, seven, eight, nine, ten, eleven, twelve, fifteen, sixteen, eighteen, nineteen, twenty-three, twenty-four, twenty-five, twenty-seven, thirty-two and alveoplasty.    Social History:  reports that she has never smoked. She has never used smokeless tobacco. She reports current alcohol use of about 1.0 standard drinks of alcohol per week. She reports previous drug use. Frequency: 1.00 time per week. Drug: Cocaine.  Allergies: No Known Allergies  Family History:  Family History  Problem Relation Age of Onset  . Diabetes type II Mother   . Hypertension Mother   . Cancer Father        patient does not know what type of cancer  . Diabetes Daughter   . Breast cancer Neg Hx   . Colon polyps Neg Hx   . Crohn's disease Neg Hx   . Esophageal cancer Neg Hx   . Rectal cancer Neg Hx   . Stomach cancer Neg Hx      Current Outpatient Medications:  .  ACCU-CHEK SOFTCLIX LANCETS lancets, Accu-Chek Softclix Lancets  USE AS DIRECTED TO CHECK BLOOD SUGAR TWICE A DAY, Disp: , Rfl:  .  amLODipine (NORVASC) 10 MG tablet, Take 1 tablet (10 mg total) by mouth daily., Disp: 30 tablet, Rfl: 0 .  Cholecalciferol (VITAMIN D3 PO), Take 1 tablet by mouth daily., Disp: , Rfl:  .  glucose blood test strip, Accu-Chek Aviva Plus test strips  USE TO CHECK BLOOD SUGAR TWICE A DAY, Disp: , Rfl:  .  hydrochlorothiazide (HYDRODIURIL) 12.5 MG tablet, , Disp: , Rfl:  .  meloxicam (MOBIC) 15 MG tablet, meloxicam 15 mg tablet   TAKE 1 TABLET BY MOUTH EVERY DAY, Disp: , Rfl:  .  metFORMIN (GLUCOPHAGE) 1000 MG tablet, Take 1,000 mg by mouth 2 (two) times daily., Disp: , Rfl: 2 .  vitamin B-12 (CYANOCOBALAMIN) 100 MCG tablet, Use as directed 100 mg in the mouth or throat daily., Disp: , Rfl:  .  aspirin EC 81 MG tablet, Take 1 tablet (81 mg total) by mouth daily., Disp: 31 tablet, Rfl: 11 .  lisinopril (PRINIVIL,ZESTRIL) 20 MG tablet, Take 1 tablet (20 mg total) by mouth daily., Disp: 90 tablet, Rfl: 3  Current Facility-Administered Medications:  .  0.9 %  sodium chloride infusion, 500 mL, Intravenous, Once, Irene Shipper, MD  Review of Systems:  Constitutional: Denies fever, chills, diaphoresis, appetite change and fatigue.  HEENT: Denies photophobia, eye pain, redness, hearing loss, ear pain, congestion, sore throat, rhinorrhea, sneezing, mouth sores, trouble swallowing, neck pain, neck stiffness and tinnitus.   Respiratory: Denies SOB, DOE, cough, chest tightness,  and wheezing.   Cardiovascular: Denies chest pain, palpitations and leg swelling.  Gastrointestinal: Denies nausea, vomiting, abdominal pain, diarrhea, constipation, blood in stool and abdominal distention.  Genitourinary: Denies dysuria, urgency, frequency, hematuria, flank pain and difficulty urinating.  Endocrine: Denies: hot or cold intolerance, sweats, changes in hair or nails, polyuria, polydipsia. Musculoskeletal: Denies myalgias, back pain, joint swelling, arthralgias and gait problem.  Skin: Denies pallor, rash and wound.  Neurological: Denies dizziness, seizures, syncope, weakness, light-headedness, numbness and headaches.  Hematological: Denies adenopathy. Easy bruising, personal or family bleeding history  Psychiatric/Behavioral: Denies suicidal ideation, mood changes, confusion, nervousness, sleep disturbance and agitation    Physical Exam: Vitals:   06/22/18 0826  BP: (!) 190/88  Pulse: 88  Temp: 98.1 F (36.7 C)  TempSrc: Oral    SpO2: 97%  Weight: 188 lb 9.6 oz (85.5 kg)  Height: 5' 4.5" (1.638 m)    Body mass index is 31.87 kg/m.   Constitutional: NAD, calm, comfortable Eyes: PERRL, lids and conjunctivae normal ENMT: Mucous membranes are moist. Neck: normal, supple, no masses, no thyromegaly Respiratory: clear to auscultation bilaterally, no wheezing, no crackles. Normal respiratory effort. No accessory muscle use.  Cardiovascular: Regular rate and rhythm, strong systolic ejection murmur best heard at the right upper sternal border, no rubs / gallops. No extremity edema. 2+ pedal pulses. No carotid bruits.  Abdomen: no tenderness, no masses palpated. No hepatosplenomegaly. Bowel sounds positive.  Musculoskeletal: no clubbing / cyanosis. No joint deformity upper and lower extremities. Good ROM, no contractures. Normal muscle tone.  Skin: no rashes, lesions, ulcers. No induration Neurologic: CN 2-12 grossly intact. Sensation intact, DTR normal. Strength 5/5 in all 4.  Psychiatric: Normal judgment and insight. Alert and oriented x 3. Normal mood.    Impression and Plan:  Type 2 diabetes mellitus with other neurologic complication, without long-term current use of insulin ([place])  -Surprisingly well controlled  with an A1c today of 6.0, is on metformin thousand milligrams twice daily. -Have continued to encourage weight loss and healthy eating. -Urine microalbumin ordered today.  Malignant hypertension  -Very uncontrolled today with blood pressures ranging between 190/88-200/100. -She denies any signs of endorgan damage: Headache, chest pain, shortness of breath, blurry/double vision.  Will check renal function today. -She states she has been compliant with her amlodipine 10 mg and with 12.5 mg of hydrochlorothiazide that was started 2 weeks ago by vascular surgery. -Will add lisinopril 20 mg today which should also help with renal protection given her diabetes. -She will return in 1 week for continuous blood  pressure management. -She has been advised to go promptly to the emergency department if she develops headaches, chest pain, blurry vision. -She states she has not used cocaine since last year, will check UDS as ongoing cocaine use is a potential etiology for malignant hypertension. -Avoid use of beta-blockers given cocaine use.  Alcohol abuse -Counseled on cessation, already has alcoholic cirrhosis.  Cocaine abuse ([place])  -She states she has not used in a year, however given her very uncontrolled blood pressure I am concerned for relapse, UDS today. -I have counseled her extensively on cocaine cessation.  Left pontine cerebrovascular accident [facility])  -She has slurred speech as result of this. -She is not on antiplatelet therapy, she will start using aspirin 81 mg. -Last LDL measured at time of her stroke was 128, she is not on statin therapy, need to push LDL down to below 70. -Since she is fasting today, will go ahead and check lipids.  Will await results, but suspect high intensity statin will be next step.  Chronic hepatitis ([place]) -She has a history of positive hepatitis C antibody, this is followed by GI.  Alcoholic cirrhosis of liver without ascites ([place]) -History of cirrhosis presumably due to alcohol use plus minus hepatitis C. -Followed by GI.  Hyperlipidemia associated with type 2 diabetes mellitus ([place])  -As discussed above, lipids today, suspect will need high intensity statin. -Goal LDL is below 70 in this patient with history of diabetes, malignant hypertension and prior history of CVA.  Encounter for immunization - Plan: Flu vaccine HIGH DOSE PF  Systolic murmur -We have spent most of our visit today discussing malignant hypertension, but she is noted to have a strong systolic murmur on exam. -She denies IV drug abuse. -2D echo performed at time of stroke in January 2018, did not note any aortic valve disease. -Once we get blood pressure better controlled, will send  for 2D echo to further evaluate.  Right carotid artery stenosis -Follows with vascular surgery every 6 months with carotid ultrasounds.   This patient has several life-threatening conditions including malignant hypertension, ongoing cocaine and alcohol abuse and multiple other cardiovascular risk factors.  She continues to drink alcohol and use cocaine on a consistent basis.  She has been counseled extensively today.  She will need to return next week for blood pressure follow-up.  She has been advised to proceed to the emergency department immediately if she develops any signs of endorgan damage.  I have spent 60 minutes with this patient today, greater than 50% of the time was spent in face-to-face contact discussing medical history, ongoing substance and alcohol use, my concerns about her malignant hypertension and plan of care.   Patient Instructions  -It was nice meeting you today!  -Lab work today. We will notify you when results are available.  -Start taking aspirin 81 mg daily.  -  Your blood pressure is extremely high at 200/100! Start taking lisinopril 20 mg daily in addition to amlodipine 10 mg daily and HCTZ 12.5 mg daily.  -Refrain from cocaine use, decrease alcohol intake.  -Make sure you schedule an eye exam soon.  -Schedule follow up in 1 week for BP check.     [name], MD [facility]

## 2018-06-23 LAB — DRUG SCREEN, URINE
Amphetamines, Urine: NEGATIVE ng/mL
Barbiturate screen, urine: NEGATIVE ng/mL
Benzodiazepine Quant, Ur: NEGATIVE ng/mL
Cannabinoid Quant, Ur: NEGATIVE ng/mL
Cocaine (Metab.): NEGATIVE ng/mL
Opiate Quant, Ur: NEGATIVE ng/mL
PCP Quant, Ur: NEGATIVE ng/mL

## 2018-06-29 ENCOUNTER — Ambulatory Visit: Payer: Self-pay | Admitting: Internal Medicine

## 2018-06-30 ENCOUNTER — Encounter: Payer: Self-pay | Admitting: Internal Medicine

## 2018-06-30 ENCOUNTER — Telehealth: Payer: Self-pay | Admitting: Internal Medicine

## 2018-06-30 ENCOUNTER — Ambulatory Visit (INDEPENDENT_AMBULATORY_CARE_PROVIDER_SITE_OTHER): Payer: Medicare Other | Admitting: Internal Medicine

## 2018-06-30 VITALS — BP 170/100 | HR 81 | Temp 98.8°F | Wt 187.4 lb

## 2018-06-30 DIAGNOSIS — I10 Essential (primary) hypertension: Secondary | ICD-10-CM | POA: Diagnosis not present

## 2018-06-30 DIAGNOSIS — M549 Dorsalgia, unspecified: Secondary | ICD-10-CM

## 2018-06-30 DIAGNOSIS — G8929 Other chronic pain: Secondary | ICD-10-CM

## 2018-06-30 DIAGNOSIS — E1159 Type 2 diabetes mellitus with other circulatory complications: Secondary | ICD-10-CM

## 2018-06-30 MED ORDER — GLUCOSE BLOOD VI STRP: 1.0000 | ORAL_STRIP | Freq: Every day | 3 refills | Status: DC

## 2018-06-30 MED ORDER — ACCU-CHEK AVIVA DEVI: 1.0000 | Freq: Every day | 0 refills | Status: DC

## 2018-06-30 MED ORDER — MELOXICAM 15 MG PO TABS: ORAL_TABLET | ORAL | 3 refills | Status: DC

## 2018-06-30 NOTE — Progress Notes (Signed)
Established Patient Office Visit     CC/Reason for Visit: blood pressure f/u  HPI: Deborah Wise is a 68 y.o. female who is coming in today for the above mentioned reason. Patient was seen here a week ago for elevated blood pressure.  At that time, lisinopril was added, but today her blood pressure is still elevated at 170/100.  Patient sts that she checks her blood pressure at home about once a day.  She says they average anywhere from 140s-190s/80s-100s.  Patient sts that she drinks beer a few times a week, but no longer uses cocaine.  She sts that she quit doing cocaine about a year ago.  Patient sts that she only eats b/w 12pm-12am daily.  She says she drinks many juices, sodas and fried foods on a daily basis.  Patient is also requesting a refill on meloxicam due to back pain.     Past Medical/Surgical History: Past Medical History:  Diagnosis Date  . Arthritis   . Carotid artery occlusion   . Cirrhosis ([place])   . Diabetes mellitus without complication ([place])   . Fibroid, uterine   . GERD (gastroesophageal reflux disease)   . Hepatitis    hep C  . Hypertension   . Stroke ([place])   . Thrombocytopenia ([place]) 12/25/2014  . Urinary incontinence    Patient noticed mild and is not currently a significant problem    Past Surgical History:  Procedure Laterality Date  . ANKLE FRACTURE SURGERY Right 1999   x 2, rod  . IR GENERIC HISTORICAL  06/30/2016   IR ANGIO VERTEBRAL SEL SUBCLAVIAN INNOMINATE UNI R MOD SED 06/30/2016 [name], MD [facility]  . IR GENERIC HISTORICAL  06/30/2016   IR ANGIO VERTEBRAL SEL VERTEBRAL UNI L MOD SED 06/30/2016 [name], MD [facility]  . IR GENERIC HISTORICAL  06/30/2016   IR ANGIO INTRA EXTRACRAN SEL COM CAROTID INNOMINATE BILAT MOD SED 06/30/2016 [name], MD [facility]  . MULTIPLE EXTRACTIONS WITH ALVEOLOPLASTY  10/25/2011   Procedure: MULTIPLE EXTRACION WITH ALVEOLOPLASTY;  Surgeon: Gae Bon, DDS;  Location:  [facility];  Service: Oral Surgery;  Laterality: Bilateral;  Extract teeth numbers one, two, six, seven, eight, nine, ten, eleven, twelve, fifteen, sixteen, eighteen, nineteen, twenty-three, twenty-four, twenty-five, twenty-seven, thirty-two and alveoplasty.    Social History:  reports that she has never smoked. She has never used smokeless tobacco. She reports current alcohol use of about 1.0 standard drinks of alcohol per week. She reports previous drug use. Frequency: 1.00 time per week. Drug: Cocaine.  Allergies: No Known Allergies  Family History:  Family History  Problem Relation Age of Onset  . Diabetes type II Mother   . Hypertension Mother   . Cancer Father        patient does not know what type of cancer  . Diabetes Daughter   . Breast cancer Neg Hx   . Colon polyps Neg Hx   . Crohn's disease Neg Hx   . Esophageal cancer Neg Hx   . Rectal cancer Neg Hx   . Stomach cancer Neg Hx      Current Outpatient Medications:  .  ACCU-CHEK SOFTCLIX LANCETS lancets, Accu-Chek Softclix Lancets  USE AS DIRECTED TO CHECK BLOOD SUGAR TWICE A DAY, Disp: , Rfl:  .  amLODipine (NORVASC) 10 MG tablet, Take 1 tablet (10 mg total) by mouth daily., Disp: 30 tablet, Rfl: 0 .  aspirin EC 81 MG tablet, Take 1 tablet (81 mg  total) by mouth daily., Disp: 31 tablet, Rfl: 11 .  atorvastatin (LIPITOR) 80 MG tablet, Take 1 tablet (80 mg total) by mouth daily., Disp: 90 tablet, Rfl: 3 .  Blood Glucose Monitoring Suppl (ACCU-CHEK AVIVA) device, 1 each by Other route daily. Test glucose once daily Dx E11.9 E11.69, Disp: 1 each, Rfl: 0 .  Cholecalciferol (VITAMIN D3 PO), Take 1 tablet by mouth daily., Disp: , Rfl:  .  glucose blood test strip, 1 each by Other route daily. Dx E11.9 E11.69, Disp: 100 each, Rfl: 3 .  hydrochlorothiazide (HYDRODIURIL) 12.5 MG tablet, , Disp: , Rfl:  .  lisinopril (PRINIVIL,ZESTRIL) 20 MG tablet, Take 1 tablet (20 mg total) by mouth daily., Disp: 90 tablet, Rfl: 3 .  meloxicam  (MOBIC) 15 MG tablet, meloxicam 15 mg tablet  TAKE 1 TABLET BY MOUTh twice daily, Disp: 60 tablet, Rfl: 3 .  metFORMIN (GLUCOPHAGE) 1000 MG tablet, Take 1,000 mg by mouth 2 (two) times daily., Disp: , Rfl: 2 .  vitamin B-12 (CYANOCOBALAMIN) 100 MCG tablet, Use as directed 100 mg in the mouth or throat daily., Disp: , Rfl:   Current Facility-Administered Medications:  .  0.9 %  sodium chloride infusion, 500 mL, Intravenous, Once, Irene Shipper, MD  Review of Systems:  Constitutional: Denies fever, chills, diaphoresis, appetite change and fatigue.   Respiratory: Denies SOB, DOE, cough, chest tightness, and wheezing.   Cardiovascular: Denies chest pain, palpitations and leg swelling.     Physical Exam: Vitals:   06/30/18 1049  BP: (!) 170/100  Pulse: 81  Temp: 98.8 F (37.1 C)  TempSrc: Oral  SpO2: 95%  Weight: 187 lb 6.4 oz (85 kg)    Body mass index is 31.67 kg/m.   Constitutional: NAD, calm, comfortable Eyes: PERRL, lids and conjunctivae normal Respiratory: clear to auscultation bilaterally, no wheezing, no crackles. Normal respiratory effort. No accessory muscle use. Murmur present. Cardiovascular: Regular rate and rhythm, no murmurs / rubs / gallops. No extremity edema. 2+ pedal pulses.     Impression and Plan:  Hypertension associated with diabetes ([place]) -  -Basic metabolic panel to follow renal function on electrolytes on HCTZ and lisinopril. -cont to take medications as prescribed -cont to check blood pressure at home and write down to bring to next appointment -follow DASH diet -follow up in 1 month  Chronic back pain, unspecified back location, unspecified back pain laterality  -meloxicam (MOBIC) 15 MG tablet -use heat or ice as needed to decrease back pain   Patient Instructions  Great seeing you today.  Instructions: -Cont. Taking your blood pressure as prescribed daily for blood pressure control. -Cont. To monitor your blood pressure at home each day,  write down your numbers and bring to next visit. -Decrease the amount of sodas, juices and fried foods that you consume each day to help lower your blood pressure and blood sugar. -Come back in 1 month for blood pressure follow up.  DASH Eating Plan DASH stands for "Dietary Approaches to Stop Hypertension." The DASH eating plan is a healthy eating plan that has been shown to reduce high blood pressure (hypertension). It may also reduce your risk for type 2 diabetes, heart disease, and stroke. The DASH eating plan may also help with weight loss. What are tips for following this plan?  General guidelines  Avoid eating more than 2,300 mg (milligrams) of salt (sodium) a day. If you have hypertension, you may need to reduce your sodium intake to 1,500 mg a day.  Limit alcohol intake to no more than 1 drink a day for nonpregnant women and 2 drinks a day for men. One drink equals 12 oz of beer, 5 oz of wine, or 1 oz of hard liquor.  Work with your health care provider to maintain a healthy body weight or to lose weight. Ask what an ideal weight is for you.  Get at least 30 minutes of exercise that causes your heart to beat faster (aerobic exercise) most days of the week. Activities may include walking, swimming, or biking.  Work with your health care provider or diet and nutrition specialist (dietitian) to adjust your eating plan to your individual calorie needs. Reading food labels   Check food labels for the amount of sodium per serving. Choose foods with less than 5 percent of the Daily Value of sodium. Generally, foods with less than 300 mg of sodium per serving fit into this eating plan.  To find whole grains, look for the word "whole" as the first word in the ingredient list. Shopping  Buy products labeled as "low-sodium" or "no salt added."  Buy fresh foods. Avoid canned foods and premade or frozen meals. Cooking  Avoid adding salt when cooking. Use salt-free seasonings or herbs  instead of table salt or sea salt. Check with your health care provider or pharmacist before using salt substitutes.  Do not fry foods. Cook foods using healthy methods such as baking, boiling, grilling, and broiling instead.  Cook with heart-healthy oils, such as olive, canola, soybean, or sunflower oil. Meal planning  Eat a balanced diet that includes: ? 5 or more servings of fruits and vegetables each day. At each meal, try to fill half of your plate with fruits and vegetables. ? Up to 6-8 servings of whole grains each day. ? Less than 6 oz of lean meat, poultry, or fish each day. A 3-oz serving of meat is about the same size as a deck of cards. One egg equals 1 oz. ? 2 servings of low-fat dairy each day. ? A serving of nuts, seeds, or beans 5 times each week. ? Heart-healthy fats. Healthy fats called Omega-3 fatty acids are found in foods such as flaxseeds and coldwater fish, like sardines, salmon, and mackerel.  Limit how much you eat of the following: ? Canned or prepackaged foods. ? Food that is high in trans fat, such as fried foods. ? Food that is high in saturated fat, such as fatty meat. ? Sweets, desserts, sugary drinks, and other foods with added sugar. ? Full-fat dairy products.  Do not salt foods before eating.  Try to eat at least 2 vegetarian meals each week.  Eat more home-cooked food and less restaurant, buffet, and fast food.  When eating at a restaurant, ask that your food be prepared with less salt or no salt, if possible. What foods are recommended? The items listed may not be a complete list. Talk with your dietitian about what dietary choices are best for you. Grains Whole-grain or whole-wheat bread. Whole-grain or whole-wheat pasta. Brown rice. [place] Morrow. Bulgur. Whole-grain and low-sodium cereals. Pita bread. Low-fat, low-sodium crackers. Whole-wheat flour tortillas. Vegetables Fresh or frozen vegetables (raw, steamed, roasted, or grilled).  Low-sodium or reduced-sodium tomato and vegetable juice. Low-sodium or reduced-sodium tomato sauce and tomato paste. Low-sodium or reduced-sodium canned vegetables. Fruits All fresh, dried, or frozen fruit. Canned fruit in natural juice (without added sugar). Meat and other protein foods Skinless chicken or [place]. Ground chicken or [place]. Pork with fat  trimmed off. Fish and seafood. Egg whites. Dried beans, peas, or lentils. Unsalted nuts, nut butters, and seeds. Unsalted canned beans. Lean cuts of beef with fat trimmed off. Low-sodium, lean deli meat. Dairy Low-fat (1%) or fat-free (skim) milk. Fat-free, low-fat, or reduced-fat cheeses. Nonfat, low-sodium ricotta or cottage cheese. Low-fat or nonfat yogurt. Low-fat, low-sodium cheese. Fats and oils Soft margarine without trans fats. Vegetable oil. Low-fat, reduced-fat, or light mayonnaise and salad dressings (reduced-sodium). Canola, safflower, olive, soybean, and sunflower oils. Avocado. Seasoning and other foods Herbs. Spices. Seasoning mixes without salt. Unsalted popcorn and pretzels. Fat-free sweets. What foods are not recommended? The items listed may not be a complete list. Talk with your dietitian about what dietary choices are best for you. Grains Baked goods made with fat, such as croissants, muffins, or some breads. Dry pasta or rice meal packs. Vegetables Creamed or fried vegetables. Vegetables in a cheese sauce. Regular canned vegetables (not low-sodium or reduced-sodium). Regular canned tomato sauce and paste (not low-sodium or reduced-sodium). Regular tomato and vegetable juice (not low-sodium or reduced-sodium). Angie Fava. Olives. Fruits Canned fruit in a light or heavy syrup. Fried fruit. Fruit in cream or butter sauce. Meat and other protein foods Fatty cuts of meat. Ribs. Fried meat. Berniece Salines. Sausage. Bologna and other processed lunch meats. Salami. Fatback. Hotdogs. Bratwurst. Salted nuts and seeds. Canned beans with added  salt. Canned or smoked fish. Whole eggs or egg yolks. Chicken or [place] with skin. Dairy Whole or 2% milk, cream, and half-and-half. Whole or full-fat cream cheese. Whole-fat or sweetened yogurt. Full-fat cheese. Nondairy creamers. Whipped toppings. Processed cheese and cheese spreads. Fats and oils Butter. Stick margarine. Lard. Shortening. Ghee. Bacon fat. Tropical oils, such as coconut, palm kernel, or palm oil. Seasoning and other foods Salted popcorn and pretzels. Onion salt, garlic salt, seasoned salt, table salt, and sea salt. Worcestershire sauce. Tartar sauce. Barbecue sauce. Teriyaki sauce. Soy sauce, including reduced-sodium. Steak sauce. Canned and packaged gravies. Fish sauce. Oyster sauce. Cocktail sauce. Horseradish that you find on the shelf. Ketchup. Mustard. Meat flavorings and tenderizers. Bouillon cubes. Hot sauce and Tabasco sauce. Premade or packaged marinades. Premade or packaged taco seasonings. Relishes. Regular salad dressings. Where to find more information:  National Heart, Lung, and [place]: https://wilson-eaton.com/  American Heart Association: www.heart.org Summary  The DASH eating plan is a healthy eating plan that has been shown to reduce high blood pressure (hypertension). It may also reduce your risk for type 2 diabetes, heart disease, and stroke.  With the DASH eating plan, you should limit salt (sodium) intake to 2,300 mg a day. If you have hypertension, you may need to reduce your sodium intake to 1,500 mg a day.  When on the DASH eating plan, aim to eat more fresh fruits and vegetables, whole grains, lean proteins, low-fat dairy, and heart-healthy fats.  Work with your health care provider or diet and nutrition specialist (dietitian) to adjust your eating plan to your individual calorie needs. This information is not intended to replace advice given to you by your health care provider. Make sure you discuss any questions you have with your health care  provider. Document Released: 05/13/2011 Document Revised: 05/17/2016 Document Reviewed: 05/17/2016 Elsevier Interactive Patient Education  2019 [place], RN DNP student [facility] [employer]

## 2018-06-30 NOTE — Patient Instructions (Addendum)
Great seeing you today.  Instructions: -Cont. Taking your blood pressure as prescribed daily for blood pressure control. -Cont. To monitor your blood pressure at home each day, write down your numbers and bring to next visit. -Decrease the amount of sodas, juices and fried foods that you consume each day to help lower your blood pressure and blood sugar. -Come back in 1 month for blood pressure follow up.  DASH Eating Plan DASH stands for "Dietary Approaches to Stop Hypertension." The DASH eating plan is a healthy eating plan that has been shown to reduce high blood pressure (hypertension). It may also reduce your risk for type 2 diabetes, heart disease, and stroke. The DASH eating plan may also help with weight loss. What are tips for following this plan?  General guidelines  Avoid eating more than 2,300 mg (milligrams) of salt (sodium) a day. If you have hypertension, you may need to reduce your sodium intake to 1,500 mg a day.  Limit alcohol intake to no more than 1 drink a day for nonpregnant women and 2 drinks a day for men. One drink equals 12 oz of beer, 5 oz of wine, or 1 oz of hard liquor.  Work with your health care provider to maintain a healthy body weight or to lose weight. Ask what an ideal weight is for you.  Get at least 30 minutes of exercise that causes your heart to beat faster (aerobic exercise) most days of the week. Activities may include walking, swimming, or biking.  Work with your health care provider or diet and nutrition specialist (dietitian) to adjust your eating plan to your individual calorie needs. Reading food labels   Check food labels for the amount of sodium per serving. Choose foods with less than 5 percent of the Daily Value of sodium. Generally, foods with less than 300 mg of sodium per serving fit into this eating plan.  To find whole grains, look for the word "whole" as the first word in the ingredient list. Shopping  Buy products labeled as  "low-sodium" or "no salt added."  Buy fresh foods. Avoid canned foods and premade or frozen meals. Cooking  Avoid adding salt when cooking. Use salt-free seasonings or herbs instead of table salt or sea salt. Check with your health care provider or pharmacist before using salt substitutes.  Do not fry foods. Cook foods using healthy methods such as baking, boiling, grilling, and broiling instead.  Cook with heart-healthy oils, such as olive, canola, soybean, or sunflower oil. Meal planning  Eat a balanced diet that includes: ? 5 or more servings of fruits and vegetables each day. At each meal, try to fill half of your plate with fruits and vegetables. ? Up to 6-8 servings of whole grains each day. ? Less than 6 oz of lean meat, poultry, or fish each day. A 3-oz serving of meat is about the same size as a deck of cards. One egg equals 1 oz. ? 2 servings of low-fat dairy each day. ? A serving of nuts, seeds, or beans 5 times each week. ? Heart-healthy fats. Healthy fats called Omega-3 fatty acids are found in foods such as flaxseeds and coldwater fish, like sardines, salmon, and mackerel.  Limit how much you eat of the following: ? Canned or prepackaged foods. ? Food that is high in trans fat, such as fried foods. ? Food that is high in saturated fat, such as fatty meat. ? Sweets, desserts, sugary drinks, and other foods with added sugar. ?  Full-fat dairy products.  Do not salt foods before eating.  Try to eat at least 2 vegetarian meals each week.  Eat more home-cooked food and less restaurant, buffet, and fast food.  When eating at a restaurant, ask that your food be prepared with less salt or no salt, if possible. What foods are recommended? The items listed may not be a complete list. Talk with your dietitian about what dietary choices are best for you. Grains Whole-grain or whole-wheat bread. Whole-grain or whole-wheat pasta. Brown rice. [place]. Bulgur. Whole-grain  and low-sodium cereals. Pita bread. Low-fat, low-sodium crackers. Whole-wheat flour tortillas. Vegetables Fresh or frozen vegetables (raw, steamed, roasted, or grilled). Low-sodium or reduced-sodium tomato and vegetable juice. Low-sodium or reduced-sodium tomato sauce and tomato paste. Low-sodium or reduced-sodium canned vegetables. Fruits All fresh, dried, or frozen fruit. Canned fruit in natural juice (without added sugar). Meat and other protein foods Skinless chicken or [place]. Ground chicken or [place]. Pork with fat trimmed off. Fish and seafood. Egg whites. Dried beans, peas, or lentils. Unsalted nuts, nut butters, and seeds. Unsalted canned beans. Lean cuts of beef with fat trimmed off. Low-sodium, lean deli meat. Dairy Low-fat (1%) or fat-free (skim) milk. Fat-free, low-fat, or reduced-fat cheeses. Nonfat, low-sodium ricotta or cottage cheese. Low-fat or nonfat yogurt. Low-fat, low-sodium cheese. Fats and oils Soft margarine without trans fats. Vegetable oil. Low-fat, reduced-fat, or light mayonnaise and salad dressings (reduced-sodium). Canola, safflower, olive, soybean, and sunflower oils. Avocado. Seasoning and other foods Herbs. Spices. Seasoning mixes without salt. Unsalted popcorn and pretzels. Fat-free sweets. What foods are not recommended? The items listed may not be a complete list. Talk with your dietitian about what dietary choices are best for you. Grains Baked goods made with fat, such as croissants, muffins, or some breads. Dry pasta or rice meal packs. Vegetables Creamed or fried vegetables. Vegetables in a cheese sauce. Regular canned vegetables (not low-sodium or reduced-sodium). Regular canned tomato sauce and paste (not low-sodium or reduced-sodium). Regular tomato and vegetable juice (not low-sodium or reduced-sodium). Angie Fava. Olives. Fruits Canned fruit in a light or heavy syrup. Fried fruit. Fruit in cream or butter sauce. Meat and other protein foods Fatty cuts  of meat. Ribs. Fried meat. Berniece Salines. Sausage. Bologna and other processed lunch meats. Salami. Fatback. Hotdogs. Bratwurst. Salted nuts and seeds. Canned beans with added salt. Canned or smoked fish. Whole eggs or egg yolks. Chicken or [place] with skin. Dairy Whole or 2% milk, cream, and half-and-half. Whole or full-fat cream cheese. Whole-fat or sweetened yogurt. Full-fat cheese. Nondairy creamers. Whipped toppings. Processed cheese and cheese spreads. Fats and oils Butter. Stick margarine. Lard. Shortening. Ghee. Bacon fat. Tropical oils, such as coconut, palm kernel, or palm oil. Seasoning and other foods Salted popcorn and pretzels. Onion salt, garlic salt, seasoned salt, table salt, and sea salt. Worcestershire sauce. Tartar sauce. Barbecue sauce. Teriyaki sauce. Soy sauce, including reduced-sodium. Steak sauce. Canned and packaged gravies. Fish sauce. Oyster sauce. Cocktail sauce. Horseradish that you find on the shelf. Ketchup. Mustard. Meat flavorings and tenderizers. Bouillon cubes. Hot sauce and Tabasco sauce. Premade or packaged marinades. Premade or packaged taco seasonings. Relishes. Regular salad dressings. Where to find more information:  National Heart, Lung, and [place]: https://wilson-eaton.com/  American Heart Association: www.heart.org Summary  The DASH eating plan is a healthy eating plan that has been shown to reduce high blood pressure (hypertension). It may also reduce your risk for type 2 diabetes, heart disease, and stroke.  With the DASH eating plan, you  should limit salt (sodium) intake to 2,300 mg a day. If you have hypertension, you may need to reduce your sodium intake to 1,500 mg a day.  When on the DASH eating plan, aim to eat more fresh fruits and vegetables, whole grains, lean proteins, low-fat dairy, and heart-healthy fats.  Work with your health care provider or diet and nutrition specialist (dietitian) to adjust your eating plan to your individual calorie  needs. This information is not intended to replace advice given to you by your health care provider. Make sure you discuss any questions you have with your health care provider. Document Released: 05/13/2011 Document Revised: 05/17/2016 Document Reviewed: 05/17/2016 Elsevier Interactive Patient Education  2019 [employer].

## 2018-07-11 NOTE — Telephone Encounter (Signed)
Pt called and stated that Guide is covered by insurance. Please advise [phone]

## 2018-07-11 NOTE — Telephone Encounter (Signed)
Pt calling in.  States that the pharmacy doesn't have this brand of monitoring kit any longer and they are requesting a new script for a different kind be called in.

## 2018-07-11 NOTE — Telephone Encounter (Addendum)
Informed patient that she will have to find out what monitor is covered and we will send in a prescription. CRM

## 2018-07-12 NOTE — Telephone Encounter (Signed)
Ordered 07/04/2018.

## 2018-07-19 ENCOUNTER — Other Ambulatory Visit: Payer: Self-pay | Admitting: Physician Assistant

## 2018-07-19 DIAGNOSIS — Z1231 Encounter for screening mammogram for malignant neoplasm of breast: Secondary | ICD-10-CM

## 2018-08-02 ENCOUNTER — Encounter: Payer: Self-pay | Admitting: Neurology

## 2018-08-02 ENCOUNTER — Telehealth: Payer: Self-pay | Admitting: Neurology

## 2018-08-02 ENCOUNTER — Ambulatory Visit (INDEPENDENT_AMBULATORY_CARE_PROVIDER_SITE_OTHER): Payer: Medicare Other | Admitting: Neurology

## 2018-08-02 VITALS — BP 156/85 | HR 69 | Ht 65.0 in | Wt 186.8 lb

## 2018-08-02 DIAGNOSIS — I671 Cerebral aneurysm, nonruptured: Secondary | ICD-10-CM

## 2018-08-02 DIAGNOSIS — R200 Anesthesia of skin: Secondary | ICD-10-CM

## 2018-08-02 DIAGNOSIS — W19XXXA Unspecified fall, initial encounter: Secondary | ICD-10-CM

## 2018-08-02 DIAGNOSIS — I6521 Occlusion and stenosis of right carotid artery: Secondary | ICD-10-CM | POA: Diagnosis not present

## 2018-08-02 DIAGNOSIS — R202 Paresthesia of skin: Secondary | ICD-10-CM | POA: Diagnosis not present

## 2018-08-02 NOTE — Telephone Encounter (Signed)
[employer] Medicare/medicaid order sent to [facility]. No auth they will reach out to the pt to schedule.  °

## 2018-08-02 NOTE — Progress Notes (Signed)
[facility] [address] [place]. [place] [address] [phone]       OFFICE CONSULT NOTE  [name] Date of Birth:  09-Feb-1951 Medical Record Number:  [id number]   Referring MD: Dr. Jerilee Hoh  Reason for Referral: Falls and balance issues HPI: Deborah Wise is a 68 year old African-American lady seen today for office consultation visit for frequent falls.  History is obtained from the patient, review of electronic medical records and have personally reviewed imaging films where available. She states that she has had a couple of falls since January.  She states the falls are minor they were all unprovoked.  She had no time to catch herself.  She did not lose consciousness.  She bruised her knee.  She was unable to get up without assistance.  She denies any significant pain in the back, radicular pain or pain in the feet.  She does have chronic numbness in her right foot.  She is has remote history of stroke in January 2018 when she had a right paramedian pontine lacunar infarct.  She did have some left leg incoordination but she did improve.  She denies any previous back surgeries.  She denies any lack of bowel or bladder control.  She does admit that she cannot walk far and has to sit down because her legs hurt.  She has not had any follow-up neurovascular studies done since her stroke.  She did have a cerebral catheter angiogram on 06/30/2016 by Dr. 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  She was subsequently followed at vascular surgery clinic and last carotid ultrasound on 05/26/2018 had shown stable 60 to 79% right ICA stenosis.  She has not had any recurrent stroke or TIA symptoms.  She states she is on aspirin and tolerating it well without bleeding or bruising.  Her blood pressure usually is better controlled but today it is elevated in office at 156/85.   She is unable to tell me when she had her last lipid profile or hemoglobin A1c checked.  She does have diabetes and states that her control is adequate.  She has had some minor paresthesias in the feet and does not feel that this is getting worse. ROS:   14 system review of systems is positive for weight gain, double vision, shortness of breath, headache, memory loss, anxiety, decreased energy and all other systems negative  PMH:  Past Medical History:  Diagnosis Date  . Arthritis   . Carotid artery occlusion   . Cirrhosis ([place])   . Diabetes mellitus without complication ([place])   . Fibroid, uterine   . GERD (gastroesophageal reflux disease)   . Hepatitis    hep C  . Hypertension   . Stroke ([place])   . Thrombocytopenia ([place]) 12/25/2014  . Urinary incontinence    Patient noticed mild and is not currently a significant problem    Social History:  Social History   Socioeconomic History  . Marital status: Legally Separated    Spouse name: Not on file  . Number of children: 3  . Years of education: Not on file  . Highest education level: Not on file  Occupational History  . Occupation: disabled  Social Needs  . Financial resource strain: Not on file  . Food insecurity:    Worry: Not on file    Inability: Not on file  . Transportation  needs:    Medical: Not on file    Non-medical: Not on file  Tobacco Use  . Smoking status: Never Smoker  . Smokeless tobacco: Never Used  Substance and Sexual Activity  . Alcohol use: Yes    Alcohol/week: 1.0 standard drinks    Types: 1 Cans of beer per week    Comment: 1/2 bottle of wine and some beer a day  . Drug use: Not Currently    Frequency: 1.0 times per week    Types: Cocaine    Comment: last use cocaine last year 2018  . Sexual activity: Not Currently  Lifestyle  . Physical activity:    Days per week: Not on file    Minutes per session: Not on file  . Stress: Not on file  Relationships  . Social connections:    Talks on phone:  Not on file    Gets together: Not on file    Attends religious service: Not on file    Active member of club or organization: Not on file    Attends meetings of clubs or organizations: Not on file    Relationship status: Not on file  . Intimate partner violence:    Fear of current or ex partner: Not on file    Emotionally abused: Not on file    Physically abused: Not on file    Forced sexual activity: Not on file  Other Topics Concern  . Not on file  Social History Narrative  . Not on file    Medications:   Current Outpatient Medications on File Prior to Visit  Medication Sig Dispense Refill  . ACCU-CHEK SOFTCLIX LANCETS lancets Accu-Chek Softclix Lancets  USE AS DIRECTED TO CHECK BLOOD SUGAR TWICE A DAY    . amLODipine (NORVASC) 10 MG tablet Take 1 tablet (10 mg total) by mouth daily. 30 tablet 0  . aspirin EC 81 MG tablet Take 1 tablet (81 mg total) by mouth daily. 31 tablet 11  . atorvastatin (LIPITOR) 80 MG tablet Take 1 tablet (80 mg total) by mouth daily. 90 tablet 3  . Blood Glucose Monitoring Suppl (ACCU-CHEK GUIDE) w/Device KIT Please specify directions, refills and quantity 1 kit 0  . Cholecalciferol (VITAMIN D3 PO) Take 1 tablet by mouth daily.    [name] Kitchen glucose blood test strip 1 each by Other route daily. Dx E11.9 E11.69 100 each 3  . hydrochlorothiazide (HYDRODIURIL) 12.5 MG tablet     . lisinopril (PRINIVIL,ZESTRIL) 20 MG tablet Take 1 tablet (20 mg total) by mouth daily. 90 tablet 3  . meloxicam (MOBIC) 15 MG tablet meloxicam 15 mg tablet  TAKE 1 TABLET BY MOUTh twice daily 60 tablet 3  . metFORMIN (GLUCOPHAGE) 1000 MG tablet Take 1,000 mg by mouth 2 (two) times daily.  2  . vitamin B-12 (CYANOCOBALAMIN) 100 MCG tablet Use as directed 100 mg in the mouth or throat daily.     Current Facility-Administered Medications on File Prior to Visit  Medication Dose Route Frequency Provider Last Rate Last Dose  . 0.9 %  sodium chloride infusion  500 mL Intravenous Once Irene Shipper, MD        Allergies:  No Known Allergies  Physical Exam General: Pleasant middle-aged African-American lady, seated, in no evident distress Head: head normocephalic and atraumatic.   Neck: supple with no carotid or supraclavicular bruits Cardiovascular: regular rate and rhythm, no murmurs Musculoskeletal: no deformity Skin:  no rash/petichiae Vascular:  Normal pulses all extremities  Neurologic Exam  Mental Status: Awake and fully alert. Oriented to place and time. Recent and remote memory intact. Attention span, concentration and fund of knowledge appropriate. Mood and affect appropriate.  Cranial Nerves: Fundoscopic exam reveals sharp disc margins. Pupils equal, briskly reactive to light. Extraocular movements full without nystagmus. Visual fields full to confrontation. Hearing intact. Facial sensation intact. Face, tongue, palate moves normally and symmetrically.  Motor: Normal bulk and tone. Normal strength in all tested extremity muscles. Sensory.: intact to touch , pinprick , position but diminished vibratory sensation over toes bilaterally.  Coordination: Rapid alternating movements normal in all extremities. Finger-to-nose and heel-to-shin performed accurately bilaterally. Gait and Station: Arises from chair without difficulty. Stance is normal. Gait demonstrates normal stride length and balance . Able to heel, toe and tandem walk with moderate t difficulty.  Reflexes: 1+ and symmetric except ankle jerks are diminished. Toes downgoing.      ASSESSMENT: 68 year old African-American lady with recent episodes of falls likely multifactorial due to combination of underlying diabetic neuropathy as well as prior right pontine stroke in January 2018 and asymptomatic moderate right carotid stenosis.   .  Vascular risk factors of diabetes, hypertension, hyperlipidemia, stroke and carotid stenosis     PLAN: I had a long discussion with the patient regarding her falls and feet numbness  which probably are due to combination of underlying diabetic neuropathy as well as gait abnormality from previous stroke.  I recommend she get up slowly and avoid sudden and quick movements.  Check EMG nerve conduction study for neuropathy and MRI scan of the brain for interval new strokes as well as MRA of the brain and neck to follow her known extracranial stenosis and intracranial aneurysms.  She will continue aspirin for stroke prevention and maintain aggressive risk factor modification with strict control of hypertension with blood pressure goal below 130/90, lipids with LDL cholesterol goal below 70 mg percent and diabetes with hemoglobin A1c goal below 6.5%.  She was also encouraged to eat a healthy diet and to be active and exercise regularly.  Check lipid profile and hemoglobin A1c.  Greater than 50% time during this 45-minute consultation visit were spent on counseling and coordination of care about her falls and remote stroke and peripheral neuropathy and answering questions she will return for follow-up in the future in 3 months with my nurse practitioner Janett Billow or call earlier if necessary Antony Contras, MD  [facility] [address] [place], [place] [address]  Phone [phone] Fax [phone] Note: This document was prepared with digital dictation and possible smart phrase technology. Any transcriptional errors that result from this process are unintentional.

## 2018-08-02 NOTE — Patient Instructions (Signed)
I had a long discussion with the patient regarding her falls and feet numbness which probably are due to combination of underlying diabetic neuropathy as well as gait abnormality from previous stroke.  I recommend she get up slowly and avoid sudden and quick movements.  Check EMG nerve conduction study for neuropathy and MRI scan of the brain for interval new strokes as well as MRA of the brain and neck to follow her known extracranial stenosis and intracranial aneurysms.  She will continue aspirin for stroke prevention and maintain aggressive risk factor modification with strict control of hypertension with blood pressure goal below 130/90, lipids with LDL cholesterol goal below 70 mg percent and diabetes with hemoglobin A1c goal below 6.5%.  She was also encouraged to eat a healthy diet and to be active and exercise regularly.  Check lipid profile and hemoglobin A1c.  She will return for follow-up in the future in 3 months with my nurse practitioner Janett Billow or call earlier if necessary

## 2018-08-03 ENCOUNTER — Encounter: Payer: Self-pay | Admitting: Internal Medicine

## 2018-08-03 ENCOUNTER — Other Ambulatory Visit: Payer: Self-pay | Admitting: Neurology

## 2018-08-03 ENCOUNTER — Ambulatory Visit (INDEPENDENT_AMBULATORY_CARE_PROVIDER_SITE_OTHER): Payer: Medicare Other | Admitting: Internal Medicine

## 2018-08-03 VITALS — BP 200/100 | HR 70 | Temp 98.2°F | Ht 65.0 in | Wt 189.4 lb

## 2018-08-03 DIAGNOSIS — I10 Essential (primary) hypertension: Secondary | ICD-10-CM

## 2018-08-03 DIAGNOSIS — Z13820 Encounter for screening for osteoporosis: Secondary | ICD-10-CM | POA: Diagnosis not present

## 2018-08-03 DIAGNOSIS — Z780 Asymptomatic menopausal state: Secondary | ICD-10-CM | POA: Diagnosis not present

## 2018-08-03 LAB — LIPID PANEL
Chol/HDL Ratio: 3.5 ratio (ref 0.0–4.4)
Cholesterol, Total: 202 mg/dL — ABNORMAL HIGH (ref 100–199)
HDL: 58 mg/dL (ref >39)
LDL Calculated: 125 mg/dL — ABNORMAL HIGH (ref 0–99)
Triglycerides: 94 mg/dL (ref 0–149)
VLDL Cholesterol Cal: 19 mg/dL (ref 5–40)

## 2018-08-03 LAB — HEMOGLOBIN A1C
Est. average glucose Bld gHb Est-mCnc: 120 mg/dL
Hgb A1c MFr Bld: 5.8 % — ABNORMAL HIGH (ref 4.8–5.6)

## 2018-08-03 MED ORDER — LISINOPRIL 40 MG PO TABS: 40.0000 mg | ORAL_TABLET | Freq: Every day | ORAL | 3 refills | Status: DC

## 2018-08-03 MED ORDER — HYDROCHLOROTHIAZIDE 25 MG PO TABS: 25.0000 mg | ORAL_TABLET | Freq: Every day | ORAL | 3 refills | Status: DC

## 2018-08-03 MED ORDER — EVOLOCUMAB 140 MG/ML ~~LOC~~ SOSY: 140.0000 mg | PREFILLED_SYRINGE | SUBCUTANEOUS | 3 refills | Status: DC

## 2018-08-03 NOTE — Patient Instructions (Signed)
-Nice seeing you today!!  -You need to get your blood pressure under better control. While at these levels you can have a stroke, heart attack, kidney failure and many other things.  -Continue taking amlodipine 10 mg daily.  -Start taking HCTZ 25 mg daily.  -Increase lisinopril from 20 to 40 mg daily.  -Follow guidelines below for a low salt diet.  -Schedule follow up in 4 weeks for BP check.   DASH Eating Plan DASH stands for "Dietary Approaches to Stop Hypertension." The DASH eating plan is a healthy eating plan that has been shown to reduce high blood pressure (hypertension). It may also reduce your risk for type 2 diabetes, heart disease, and stroke. The DASH eating plan may also help with weight loss. What are tips for following this plan?  General guidelines  Avoid eating more than 2,300 mg (milligrams) of salt (sodium) a day. If you have hypertension, you may need to reduce your sodium intake to 1,500 mg a day.  Limit alcohol intake to no more than 1 drink a day for nonpregnant women and 2 drinks a day for men. One drink equals 12 oz of beer, 5 oz of wine, or 1 oz of hard liquor.  Work with your health care provider to maintain a healthy body weight or to lose weight. Ask what an ideal weight is for you.  Get at least 30 minutes of exercise that causes your heart to beat faster (aerobic exercise) most days of the week. Activities may include walking, swimming, or biking.  Work with your health care provider or diet and nutrition specialist (dietitian) to adjust your eating plan to your individual calorie needs. Reading food labels   Check food labels for the amount of sodium per serving. Choose foods with less than 5 percent of the Daily Value of sodium. Generally, foods with less than 300 mg of sodium per serving fit into this eating plan.  To find whole grains, look for the word "whole" as the first word in the ingredient list. Shopping  Buy products labeled as  "low-sodium" or "no salt added."  Buy fresh foods. Avoid canned foods and premade or frozen meals. Cooking  Avoid adding salt when cooking. Use salt-free seasonings or herbs instead of table salt or sea salt. Check with your health care provider or pharmacist before using salt substitutes.  Do not fry foods. Cook foods using healthy methods such as baking, boiling, grilling, and broiling instead.  Cook with heart-healthy oils, such as olive, canola, soybean, or sunflower oil. Meal planning  Eat a balanced diet that includes: ? 5 or more servings of fruits and vegetables each day. At each meal, try to fill half of your plate with fruits and vegetables. ? Up to 6-8 servings of whole grains each day. ? Less than 6 oz of lean meat, poultry, or fish each day. A 3-oz serving of meat is about the same size as a deck of cards. One egg equals 1 oz. ? 2 servings of low-fat dairy each day. ? A serving of nuts, seeds, or beans 5 times each week. ? Heart-healthy fats. Healthy fats called Omega-3 fatty acids are found in foods such as flaxseeds and coldwater fish, like sardines, salmon, and mackerel.  Limit how much you eat of the following: ? Canned or prepackaged foods. ? Food that is high in trans fat, such as fried foods. ? Food that is high in saturated fat, such as fatty meat. ? Sweets, desserts, sugary drinks, and other  foods with added sugar. ? Full-fat dairy products.  Do not salt foods before eating.  Try to eat at least 2 vegetarian meals each week.  Eat more home-cooked food and less restaurant, buffet, and fast food.  When eating at a restaurant, ask that your food be prepared with less salt or no salt, if possible. What foods are recommended? The items listed may not be a complete list. Talk with your dietitian about what dietary choices are best for you. Grains Whole-grain or whole-wheat bread. Whole-grain or whole-wheat pasta. Brown rice. [place]. Bulgur. Whole-grain  and low-sodium cereals. Pita bread. Low-fat, low-sodium crackers. Whole-wheat flour tortillas. Vegetables Fresh or frozen vegetables (raw, steamed, roasted, or grilled). Low-sodium or reduced-sodium tomato and vegetable juice. Low-sodium or reduced-sodium tomato sauce and tomato paste. Low-sodium or reduced-sodium canned vegetables. Fruits All fresh, dried, or frozen fruit. Canned fruit in natural juice (without added sugar). Meat and other protein foods Skinless chicken or [place]. Ground chicken or [place]. Pork with fat trimmed off. Fish and seafood. Egg whites. Dried beans, peas, or lentils. Unsalted nuts, nut butters, and seeds. Unsalted canned beans. Lean cuts of beef with fat trimmed off. Low-sodium, lean deli meat. Dairy Low-fat (1%) or fat-free (skim) milk. Fat-free, low-fat, or reduced-fat cheeses. Nonfat, low-sodium ricotta or cottage cheese. Low-fat or nonfat yogurt. Low-fat, low-sodium cheese. Fats and oils Soft margarine without trans fats. Vegetable oil. Low-fat, reduced-fat, or light mayonnaise and salad dressings (reduced-sodium). Canola, safflower, olive, soybean, and sunflower oils. Avocado. Seasoning and other foods Herbs. Spices. Seasoning mixes without salt. Unsalted popcorn and pretzels. Fat-free sweets. What foods are not recommended? The items listed may not be a complete list. Talk with your dietitian about what dietary choices are best for you. Grains Baked goods made with fat, such as croissants, muffins, or some breads. Dry pasta or rice meal packs. Vegetables Creamed or fried vegetables. Vegetables in a cheese sauce. Regular canned vegetables (not low-sodium or reduced-sodium). Regular canned tomato sauce and paste (not low-sodium or reduced-sodium). Regular tomato and vegetable juice (not low-sodium or reduced-sodium). Angie Fava. Olives. Fruits Canned fruit in a light or heavy syrup. Fried fruit. Fruit in cream or butter sauce. Meat and other protein foods Fatty cuts  of meat. Ribs. Fried meat. Berniece Salines. Sausage. Bologna and other processed lunch meats. Salami. Fatback. Hotdogs. Bratwurst. Salted nuts and seeds. Canned beans with added salt. Canned or smoked fish. Whole eggs or egg yolks. Chicken or [place] with skin. Dairy Whole or 2% milk, cream, and half-and-half. Whole or full-fat cream cheese. Whole-fat or sweetened yogurt. Full-fat cheese. Nondairy creamers. Whipped toppings. Processed cheese and cheese spreads. Fats and oils Butter. Stick margarine. Lard. Shortening. Ghee. Bacon fat. Tropical oils, such as coconut, palm kernel, or palm oil. Seasoning and other foods Salted popcorn and pretzels. Onion salt, garlic salt, seasoned salt, table salt, and sea salt. Worcestershire sauce. Tartar sauce. Barbecue sauce. Teriyaki sauce. Soy sauce, including reduced-sodium. Steak sauce. Canned and packaged gravies. Fish sauce. Oyster sauce. Cocktail sauce. Horseradish that you find on the shelf. Ketchup. Mustard. Meat flavorings and tenderizers. Bouillon cubes. Hot sauce and Tabasco sauce. Premade or packaged marinades. Premade or packaged taco seasonings. Relishes. Regular salad dressings. Where to find more information:  National Heart, Lung, and [place]: https://wilson-eaton.com/  American Heart Association: www.heart.org Summary  The DASH eating plan is a healthy eating plan that has been shown to reduce high blood pressure (hypertension). It may also reduce your risk for type 2 diabetes, heart disease, and stroke.  With  the DASH eating plan, you should limit salt (sodium) intake to 2,300 mg a day. If you have hypertension, you may need to reduce your sodium intake to 1,500 mg a day.  When on the DASH eating plan, aim to eat more fresh fruits and vegetables, whole grains, lean proteins, low-fat dairy, and heart-healthy fats.  Work with your health care provider or diet and nutrition specialist (dietitian) to adjust your eating plan to your individual calorie  needs. This information is not intended to replace advice given to you by your health care provider. Make sure you discuss any questions you have with your health care provider. Document Released: 05/13/2011 Document Revised: 05/17/2016 Document Reviewed: 05/17/2016 Elsevier Interactive Patient Education  2019 [employer].

## 2018-08-03 NOTE — Progress Notes (Signed)
Established Patient Office Visit     CC/Reason for Visit: BP follow up  HPI: Deborah Wise is a 68 y.o. female who is coming in today for the above mentioned reasons. Here today for BP follow up. She has been taking norvasc 10 and lisinopril 20. States she lost her HCTZ in a move, but didn't think to call us for a refill. BP today remains at 200/100. No HA, CP, SOB. Cr was 0.730 in Jan. Has been seeing neurology for falls. No acute complaints today.   Past Medical/Surgical History: Past Medical History:  Diagnosis Date  . Arthritis   . Carotid artery occlusion   . Cirrhosis ([place])   . Diabetes mellitus without complication ([employer])   . Fibroid, uterine   . GERD (gastroesophageal reflux disease)   . Hepatitis    hep C  . Hypertension   . Stroke ([place])   . Thrombocytopenia ([place]) 12/25/2014  . Urinary incontinence    Patient noticed mild and is not currently a significant problem    Past Surgical History:  Procedure Laterality Date  . ANKLE FRACTURE SURGERY Right 1999   x 2, rod  . IR GENERIC HISTORICAL  06/30/2016   IR ANGIO VERTEBRAL SEL SUBCLAVIAN INNOMINATE UNI R MOD SED 06/30/2016 [name], MD [facility]  . IR GENERIC HISTORICAL  06/30/2016   IR ANGIO VERTEBRAL SEL VERTEBRAL UNI L MOD SED 06/30/2016 [name], MD [facility]  . IR GENERIC HISTORICAL  06/30/2016   IR ANGIO INTRA EXTRACRAN SEL COM CAROTID INNOMINATE BILAT MOD SED 06/30/2016 [name], MD [facility]  . MULTIPLE EXTRACTIONS WITH ALVEOLOPLASTY  10/25/2011   Procedure: MULTIPLE EXTRACION WITH ALVEOLOPLASTY;  Surgeon: Gae Bon, DDS;  Location: [facility];  Service: Oral Surgery;  Laterality: Bilateral;  Extract teeth numbers one, two, six, seven, eight, nine, ten, eleven, twelve, fifteen, sixteen, eighteen, nineteen, twenty-three, twenty-four, twenty-five, twenty-seven, thirty-two and alveoplasty.    Social History:  reports that she has never smoked. She has never used  smokeless tobacco. She reports current alcohol use of about 1.0 standard drinks of alcohol per week. She reports previous drug use. Frequency: 1.00 time per week. Drug: Cocaine.  Allergies: No Known Allergies  Family History:  Family History  Problem Relation Age of Onset  . Diabetes type II Mother   . Hypertension Mother   . Stroke Mother   . Cancer Father        patient does not know what type of cancer  . Diabetes Daughter   . Breast cancer Neg Hx   . Colon polyps Neg Hx   . Crohn's disease Neg Hx   . Esophageal cancer Neg Hx   . Rectal cancer Neg Hx   . Stomach cancer Neg Hx      Current Outpatient Medications:  .  ACCU-CHEK SOFTCLIX LANCETS lancets, Accu-Chek Softclix Lancets  USE AS DIRECTED TO CHECK BLOOD SUGAR TWICE A DAY, Disp: , Rfl:  .  amLODipine (NORVASC) 10 MG tablet, Take 1 tablet (10 mg total) by mouth daily., Disp: 30 tablet, Rfl: 0 .  aspirin EC 81 MG tablet, Take 1 tablet (81 mg total) by mouth daily., Disp: 31 tablet, Rfl: 11 .  atorvastatin (LIPITOR) 80 MG tablet, Take 1 tablet (80 mg total) by mouth daily., Disp: 90 tablet, Rfl: 3 .  Blood Glucose Monitoring Suppl (ACCU-CHEK GUIDE) w/Device KIT, Please specify directions, refills and quantity, Disp: 1 kit, Rfl: 0 .  Cholecalciferol (VITAMIN D3 PO), Take 1  tablet by mouth daily., Disp: , Rfl:  .  glucose blood test strip, 1 each by Other route daily. Dx E11.9 E11.69, Disp: 100 each, Rfl: 3 .  meloxicam (MOBIC) 15 MG tablet, meloxicam 15 mg tablet  TAKE 1 TABLET BY MOUTh twice daily, Disp: 60 tablet, Rfl: 3 .  metFORMIN (GLUCOPHAGE) 1000 MG tablet, Take 1,000 mg by mouth 2 (two) times daily., Disp: , Rfl: 2 .  vitamin B-12 (CYANOCOBALAMIN) 100 MCG tablet, Use as directed 100 mg in the mouth or throat daily., Disp: , Rfl:  .  hydrochlorothiazide (HYDRODIURIL) 25 MG tablet, Take 1 tablet (25 mg total) by mouth daily., Disp: 90 tablet, Rfl: 3 .  lisinopril (PRINIVIL,ZESTRIL) 40 MG tablet, Take 1 tablet (40 mg  total) by mouth daily., Disp: 90 tablet, Rfl: 3  Current Facility-Administered Medications:  .  0.9 %  sodium chloride infusion, 500 mL, Intravenous, Once, Irene Shipper, MD  Review of Systems:  Constitutional: Denies fever, chills, diaphoresis, appetite change and fatigue.  HEENT: Denies photophobia, eye pain, redness, hearing loss, ear pain, congestion, sore throat, rhinorrhea, sneezing, mouth sores, trouble swallowing, neck pain, neck stiffness and tinnitus.   Respiratory: Denies SOB, DOE, cough, chest tightness,  and wheezing.   Cardiovascular: Denies chest pain, palpitations and leg swelling.  Gastrointestinal: Denies nausea, vomiting, abdominal pain, diarrhea, constipation, blood in stool and abdominal distention.  Genitourinary: Denies dysuria, urgency, frequency, hematuria, flank pain and difficulty urinating.  Endocrine: Denies: hot or cold intolerance, sweats, changes in hair or nails, polyuria, polydipsia. Musculoskeletal: Denies myalgias, back pain, joint swelling, arthralgias and gait problem.  Skin: Denies pallor, rash and wound.  Neurological: Denies dizziness, seizures, syncope, weakness, light-headedness, numbness and headaches.  Hematological: Denies adenopathy. Easy bruising, personal or family bleeding history  Psychiatric/Behavioral: Denies suicidal ideation, mood changes, confusion, nervousness, sleep disturbance and agitation    Physical Exam: Vitals:   08/03/18 1050  BP: (!) 200/100  Pulse: 70  Temp: 98.2 F (36.8 C)  TempSrc: Oral  SpO2: 94%  Weight: 189 lb 6.4 oz (85.9 kg)  Height: 5' 5"  (1.651 m)    Body mass index is 31.52 kg/m.   Constitutional: NAD, calm, comfortable Eyes: PERRL, lids and conjunctivae normal ENMT: Mucous membranes are moist.  Respiratory: clear to auscultation bilaterally, no wheezing, no crackles. Normal respiratory effort. No accessory muscle use.  Cardiovascular: Regular rate and rhythm, no murmurs / rubs / gallops. No  extremity edema. 2+ pedal pulses. No carotid bruits.  Musculoskeletal: no clubbing / cyanosis. No joint deformity upper and lower extremities. Good ROM, no contractures. Normal muscle tone.  Psychiatric: Normal judgment and insight. Alert and oriented x 3. Normal mood.    Impression and Plan:  Malignant hypertension  -Continue norvasc 10. -Increase lisinopril to 40 mg daily. -refilled and increased HCTZ to 25 mg. -RTC in 4 weeks for BP check. -She has again been counseled extensively about dangers of long standing uncontrolled BP.  Screening for osteoporosis - Plan: DG Bone Density      Patient Instructions  -Nice seeing you today!!  -You need to get your blood pressure under better control. While at these levels you can have a stroke, heart attack, kidney failure and many other things.  -Continue taking amlodipine 10 mg daily.  -Start taking HCTZ 25 mg daily.  -Increase lisinopril from 20 to 40 mg daily.  -Follow guidelines below for a low salt diet.  -Schedule follow up in 4 weeks for BP check.   DASH Eating Plan DASH  stands for "Dietary Approaches to Stop Hypertension." The DASH eating plan is a healthy eating plan that has been shown to reduce high blood pressure (hypertension). It may also reduce your risk for type 2 diabetes, heart disease, and stroke. The DASH eating plan may also help with weight loss. What are tips for following this plan?  General guidelines  Avoid eating more than 2,300 mg (milligrams) of salt (sodium) a day. If you have hypertension, you may need to reduce your sodium intake to 1,500 mg a day.  Limit alcohol intake to no more than 1 drink a day for nonpregnant women and 2 drinks a day for men. One drink equals 12 oz of beer, 5 oz of wine, or 1 oz of hard liquor.  Work with your health care provider to maintain a healthy body weight or to lose weight. Ask what an ideal weight is for you.  Get at least 30 minutes of exercise that causes your  heart to beat faster (aerobic exercise) most days of the week. Activities may include walking, swimming, or biking.  Work with your health care provider or diet and nutrition specialist (dietitian) to adjust your eating plan to your individual calorie needs. Reading food labels   Check food labels for the amount of sodium per serving. Choose foods with less than 5 percent of the Daily Value of sodium. Generally, foods with less than 300 mg of sodium per serving fit into this eating plan.  To find whole grains, look for the word "whole" as the first word in the ingredient list. Shopping  Buy products labeled as "low-sodium" or "no salt added."  Buy fresh foods. Avoid canned foods and premade or frozen meals. Cooking  Avoid adding salt when cooking. Use salt-free seasonings or herbs instead of table salt or sea salt. Check with your health care provider or pharmacist before using salt substitutes.  Do not fry foods. Cook foods using healthy methods such as baking, boiling, grilling, and broiling instead.  Cook with heart-healthy oils, such as olive, canola, soybean, or sunflower oil. Meal planning  Eat a balanced diet that includes: ? 5 or more servings of fruits and vegetables each day. At each meal, try to fill half of your plate with fruits and vegetables. ? Up to 6-8 servings of whole grains each day. ? Less than 6 oz of lean meat, poultry, or fish each day. A 3-oz serving of meat is about the same size as a deck of cards. One egg equals 1 oz. ? 2 servings of low-fat dairy each day. ? A serving of nuts, seeds, or beans 5 times each week. ? Heart-healthy fats. Healthy fats called Omega-3 fatty acids are found in foods such as flaxseeds and coldwater fish, like sardines, salmon, and mackerel.  Limit how much you eat of the following: ? Canned or prepackaged foods. ? Food that is high in trans fat, such as fried foods. ? Food that is high in saturated fat, such as fatty  meat. ? Sweets, desserts, sugary drinks, and other foods with added sugar. ? Full-fat dairy products.  Do not salt foods before eating.  Try to eat at least 2 vegetarian meals each week.  Eat more home-cooked food and less restaurant, buffet, and fast food.  When eating at a restaurant, ask that your food be prepared with less salt or no salt, if possible. What foods are recommended? The items listed may not be a complete list. Talk with your dietitian about what dietary choices  are best for you. Grains Whole-grain or whole-wheat bread. Whole-grain or whole-wheat pasta. Brown rice. [place]. Bulgur. Whole-grain and low-sodium cereals. Pita bread. Low-fat, low-sodium crackers. Whole-wheat flour tortillas. Vegetables Fresh or frozen vegetables (raw, steamed, roasted, or grilled). Low-sodium or reduced-sodium tomato and vegetable juice. Low-sodium or reduced-sodium tomato sauce and tomato paste. Low-sodium or reduced-sodium canned vegetables. Fruits All fresh, dried, or frozen fruit. Canned fruit in natural juice (without added sugar). Meat and other protein foods Skinless chicken or [place]. Ground chicken or [place]. Pork with fat trimmed off. Fish and seafood. Egg whites. Dried beans, peas, or lentils. Unsalted nuts, nut butters, and seeds. Unsalted canned beans. Lean cuts of beef with fat trimmed off. Low-sodium, lean deli meat. Dairy Low-fat (1%) or fat-free (skim) milk. Fat-free, low-fat, or reduced-fat cheeses. Nonfat, low-sodium ricotta or cottage cheese. Low-fat or nonfat yogurt. Low-fat, low-sodium cheese. Fats and oils Soft margarine without trans fats. Vegetable oil. Low-fat, reduced-fat, or light mayonnaise and salad dressings (reduced-sodium). Canola, safflower, olive, soybean, and sunflower oils. Avocado. Seasoning and other foods Herbs. Spices. Seasoning mixes without salt. Unsalted popcorn and pretzels. Fat-free sweets. What foods are not recommended? The items listed  may not be a complete list. Talk with your dietitian about what dietary choices are best for you. Grains Baked goods made with fat, such as croissants, muffins, or some breads. Dry pasta or rice meal packs. Vegetables Creamed or fried vegetables. Vegetables in a cheese sauce. Regular canned vegetables (not low-sodium or reduced-sodium). Regular canned tomato sauce and paste (not low-sodium or reduced-sodium). Regular tomato and vegetable juice (not low-sodium or reduced-sodium). Angie Fava. Olives. Fruits Canned fruit in a light or heavy syrup. Fried fruit. Fruit in cream or butter sauce. Meat and other protein foods Fatty cuts of meat. Ribs. Fried meat. Berniece Salines. Sausage. Bologna and other processed lunch meats. Salami. Fatback. Hotdogs. Bratwurst. Salted nuts and seeds. Canned beans with added salt. Canned or smoked fish. Whole eggs or egg yolks. Chicken or [place] with skin. Dairy Whole or 2% milk, cream, and half-and-half. Whole or full-fat cream cheese. Whole-fat or sweetened yogurt. Full-fat cheese. Nondairy creamers. Whipped toppings. Processed cheese and cheese spreads. Fats and oils Butter. Stick margarine. Lard. Shortening. Ghee. Bacon fat. Tropical oils, such as coconut, palm kernel, or palm oil. Seasoning and other foods Salted popcorn and pretzels. Onion salt, garlic salt, seasoned salt, table salt, and sea salt. Worcestershire sauce. Tartar sauce. Barbecue sauce. Teriyaki sauce. Soy sauce, including reduced-sodium. Steak sauce. Canned and packaged gravies. Fish sauce. Oyster sauce. Cocktail sauce. Horseradish that you find on the shelf. Ketchup. Mustard. Meat flavorings and tenderizers. Bouillon cubes. Hot sauce and Tabasco sauce. Premade or packaged marinades. Premade or packaged taco seasonings. Relishes. Regular salad dressings. Where to find more information:  National Heart, Lung, and [place]: https://wilson-eaton.com/  American Heart Association: www.heart.org Summary  The DASH  eating plan is a healthy eating plan that has been shown to reduce high blood pressure (hypertension). It may also reduce your risk for type 2 diabetes, heart disease, and stroke.  With the DASH eating plan, you should limit salt (sodium) intake to 2,300 mg a day. If you have hypertension, you may need to reduce your sodium intake to 1,500 mg a day.  When on the DASH eating plan, aim to eat more fresh fruits and vegetables, whole grains, lean proteins, low-fat dairy, and heart-healthy fats.  Work with your health care provider or diet and nutrition specialist (dietitian) to adjust your eating plan to your individual calorie needs.  This information is not intended to replace advice given to you by your health care provider. Make sure you discuss any questions you have with your health care provider. Document Released: 05/13/2011 Document Revised: 05/17/2016 Document Reviewed: 05/17/2016 Elsevier Interactive Patient Education  2019 [place], [place] [facility]

## 2018-08-04 ENCOUNTER — Telehealth: Payer: Self-pay

## 2018-08-04 NOTE — Telephone Encounter (Signed)
Garvin Fila, MD  Cc: Isaac Bliss, Rayford Halsted, MD        I spoke to the patient and informed her that her cholesterol has improved but is yet not optimal. She stated she had been compliant with taking Lipitor 80 mg every day. I recommend adding Repatha since her LDL is not optimal despite maximum dose statin. We will initiate paperwork to get insurance preauthorization. She voiced understanding

## 2018-08-07 ENCOUNTER — Telehealth: Payer: Self-pay

## 2018-08-07 ENCOUNTER — Other Ambulatory Visit: Payer: Self-pay

## 2018-08-07 MED ORDER — EVOLOCUMAB 140 MG/ML ~~LOC~~ SOAJ: 140.0000 mg | SUBCUTANEOUS | 3 refills | Status: DC

## 2018-08-07 NOTE — Telephone Encounter (Signed)
PA for Repatha done on cover my meds approve thru 02/07/2019.

## 2018-08-09 NOTE — Telephone Encounter (Signed)
I called patient to state her Repatha was approve and to take one injection every 14 days. Pt verbalized understanding and knows to pick up the injection at her pharmacy.

## 2018-08-09 NOTE — Telephone Encounter (Signed)
Dr.Sethi see note pt has not taken lipitor in a month.See note thanks

## 2018-08-09 NOTE — Telephone Encounter (Signed)
I called pt about her repatha being approve. I ask pt to continue taking the lipitor 80mg . Pt stated she has not taken it in a month. Pt stated she has move and lost the medication. I advised pt to be she has to be compliant with taking her cholesterol pills daily to avoid future strokes. I advised pt to call her PCP or pharmacy about getting in refill. Pt verbalized understanding.

## 2018-08-11 NOTE — Telephone Encounter (Signed)
Ok agree with plan

## 2018-08-22 ENCOUNTER — Ambulatory Visit
Admission: RE | Admit: 2018-08-22 | Discharge: 2018-08-22 | Disposition: A | Discharge status: Home / Self Care | Payer: Medicare Other | Source: Ambulatory Visit | Attending: Physician Assistant | Admitting: Physician Assistant

## 2018-08-22 ENCOUNTER — Other Ambulatory Visit: Payer: Self-pay

## 2018-08-22 DIAGNOSIS — Z1231 Encounter for screening mammogram for malignant neoplasm of breast: Secondary | ICD-10-CM

## 2018-08-23 ENCOUNTER — Ambulatory Visit: Payer: Medicare Other | Admitting: Family Medicine

## 2018-08-23 ENCOUNTER — Ambulatory Visit: Payer: Medicare Other | Admitting: Internal Medicine

## 2018-08-29 ENCOUNTER — Encounter (HOSPITAL_COMMUNITY): Payer: Self-pay | Admitting: Emergency Medicine

## 2018-08-29 ENCOUNTER — Other Ambulatory Visit: Payer: Self-pay

## 2018-08-29 ENCOUNTER — Emergency Department (HOSPITAL_COMMUNITY)
Admission: EM | Admit: 2018-08-29 | Discharge: 2018-08-29 | Disposition: A | Discharge status: Home / Self Care | Payer: Medicare Other | Attending: Emergency Medicine | Admitting: Emergency Medicine

## 2018-08-29 DIAGNOSIS — I10 Essential (primary) hypertension: Secondary | ICD-10-CM | POA: Diagnosis not present

## 2018-08-29 DIAGNOSIS — Z7982 Long term (current) use of aspirin: Secondary | ICD-10-CM | POA: Insufficient documentation

## 2018-08-29 DIAGNOSIS — Z8673 Personal history of transient ischemic attack (TIA), and cerebral infarction without residual deficits: Secondary | ICD-10-CM | POA: Insufficient documentation

## 2018-08-29 DIAGNOSIS — F1410 Cocaine abuse, uncomplicated: Secondary | ICD-10-CM | POA: Diagnosis not present

## 2018-08-29 DIAGNOSIS — E119 Type 2 diabetes mellitus without complications: Secondary | ICD-10-CM | POA: Insufficient documentation

## 2018-08-29 DIAGNOSIS — M5417 Radiculopathy, lumbosacral region: Secondary | ICD-10-CM

## 2018-08-29 DIAGNOSIS — Z7984 Long term (current) use of oral hypoglycemic drugs: Secondary | ICD-10-CM | POA: Diagnosis not present

## 2018-08-29 DIAGNOSIS — M545 Low back pain: Secondary | ICD-10-CM | POA: Diagnosis present

## 2018-08-29 DIAGNOSIS — Z79899 Other long term (current) drug therapy: Secondary | ICD-10-CM | POA: Insufficient documentation

## 2018-08-29 MED ORDER — KETOROLAC TROMETHAMINE 60 MG/2ML IM SOLN
60.0000 mg | Freq: Once | INTRAMUSCULAR | Status: AC
  Administered 2018-08-29: 60 mg via INTRAMUSCULAR
  Filled 2018-08-29: qty 2

## 2018-08-29 MED ORDER — PREDNISONE 10 MG PO TABS: ORAL_TABLET | ORAL | 0 refills | Status: DC

## 2018-08-29 MED ORDER — HYDROCODONE-ACETAMINOPHEN 5-325 MG PO TABS: 1.0000 | ORAL_TABLET | ORAL | 0 refills | Status: DC | PRN

## 2018-08-29 MED ORDER — DICLOFENAC SODIUM 50 MG PO TBEC: 50.0000 mg | DELAYED_RELEASE_TABLET | Freq: Two times a day (BID) | ORAL | 0 refills | Status: DC

## 2018-08-29 NOTE — Discharge Instructions (Addendum)
Prescriptions for prednisone, anti-inflammatory medicine, pain medicine.  Ice pack to painful area.  Follow-up with your primary care doctor.

## 2018-08-29 NOTE — ED Triage Notes (Signed)
Pt complaining of leg pain that has been occurring for 4 days. Pt was seen at urgent care Monday. Given muscle relaxer and steroid shot but felt no relief.

## 2018-08-29 NOTE — ED Provider Notes (Signed)
[facility] EMERGENCY DEPARTMENT Provider Note   CSN: [id number] Arrival date & time: 08/29/18  1033    History   Chief Complaint Chief Complaint  Patient presents with  . Leg Pain    HPI Deborah Wise is a 68 y.o. female.     Right buttock pain that radiates to the back of the leg down to the calf for several weeks.  "Recent x-ray negative".  No bowel or bladder incontinence.  Patient is able to walk but it hurts.  Patient was given a "shot" of steroid yesterday at urgent care center.  Severity is moderate.  Palpation and positioning make pain worse     Past Medical History:  Diagnosis Date  . Arthritis   . Carotid artery occlusion   . Cirrhosis ([place])   . Diabetes mellitus without complication ([place])   . Fibroid, uterine   . GERD (gastroesophageal reflux disease)   . Hepatitis    hep C  . Hypertension   . Stroke ([place])   . Thrombocytopenia ([place]) 12/25/2014  . Urinary incontinence    Patient noticed mild and is not currently a significant problem    Patient Active Problem List   Diagnosis Date Noted  . Cirrhosis of liver ([place]) 06/22/2018  . Chronic hepatitis ([place]) 06/22/2018  . Hyperlipidemia associated with type 2 diabetes mellitus ([place]) 06/22/2018  . Carotid artery disease ([place]) 06/22/2018  . 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 Aneurysm of middle cerebral artery   . Cerebrovascular accident (CVA) due to thrombosis of left vertebral artery ([place]) 06/28/2016  . Cocaine abuse ([place])   . Stroke (cerebrum) ([place]) 06/27/2016  . Microcytic anemia 12/27/2014  . Thrombocytopenia ([place]) 12/25/2014  . Hypoglycemia 07/21/2013  . Diabetes mellitus ([place]) 07/21/2013  . 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 Liver disease 07/21/2013  . Alcohol abuse 07/21/2013    Past Surgical History:  Procedure Laterality Date  . ANKLE FRACTURE SURGERY Right 1999   x 2, rod  . IR GENERIC HISTORICAL  06/30/2016   IR ANGIO VERTEBRAL SEL  SUBCLAVIAN INNOMINATE UNI R MOD SED 06/30/2016 [name], MD [facility]  . IR GENERIC HISTORICAL  06/30/2016   IR ANGIO VERTEBRAL SEL VERTEBRAL UNI L MOD SED 06/30/2016 [name], MD [facility]  . IR GENERIC HISTORICAL  06/30/2016   IR ANGIO INTRA EXTRACRAN SEL COM CAROTID INNOMINATE BILAT MOD SED 06/30/2016 [name], MD [facility]  . MULTIPLE EXTRACTIONS WITH ALVEOLOPLASTY  10/25/2011   Procedure: MULTIPLE EXTRACION WITH ALVEOLOPLASTY;  Surgeon: Gae Bon, DDS;  Location: [facility];  Service: Oral Surgery;  Laterality: Bilateral;  Extract teeth numbers one, two, six, seven, eight, nine, ten, eleven, twelve, fifteen, sixteen, eighteen, nineteen, twenty-three, twenty-four, twenty-five, twenty-seven, thirty-two and alveoplasty.     OB History    Gravida  5   Para  3   Term  3   Preterm      AB  2   Living  3     SAB  1   TAB  1   Ectopic      Multiple      Live Births               Home Medications    Prior to Admission medications   Medication Sig Start Date End Date Taking? Authorizing Provider  amLODipine (NORVASC) 10 MG tablet Take 1 tablet (10 mg total) by mouth daily. 09/01/16  Yes Kirsteins, Luanna Salk, MD  aspirin EC 81 MG tablet  Take 1 tablet (81 mg total) by mouth daily. 06/22/18  Yes Isaac Bliss, Rayford Halsted, MD  atorvastatin (LIPITOR) 80 MG tablet Take 1 tablet (80 mg total) by mouth daily. 06/22/18  Yes Isaac Bliss, Rayford Halsted, MD  Evolocumab (REPATHA SURECLICK) 384 MG/ML SOAJ Inject 140 mg into the skin every 14 (fourteen) days. 08/07/18  Yes Garvin Fila, MD  hydrochlorothiazide (HYDRODIURIL) 25 MG tablet Take 1 tablet (25 mg total) by mouth daily. 08/03/18  Yes Isaac Bliss, Rayford Halsted, MD  lisinopril (PRINIVIL,ZESTRIL) 40 MG tablet Take 1 tablet (40 mg total) by mouth daily. 08/03/18  Yes Isaac Bliss, Rayford Halsted, MD  meloxicam (MOBIC) 15 MG tablet meloxicam 15 mg tablet  TAKE 1 TABLET BY MOUTh twice daily Patient taking  differently: Take 15 mg by mouth 2 (two) times daily.  06/30/18  Yes Isaac Bliss, Rayford Halsted, MD  metFORMIN (GLUCOPHAGE) 1000 MG tablet Take 500 mg by mouth daily.  11/22/16  Yes [provider]  ACCU-CHEK SOFTCLIX LANCETS lancets Accu-Chek Softclix Lancets  USE AS DIRECTED TO CHECK BLOOD SUGAR TWICE A DAY    [provider]  Blood Glucose Monitoring Suppl (ACCU-CHEK GUIDE) w/Device KIT Please specify directions, refills and quantity 07/04/18   Isaac Bliss, Rayford Halsted, MD  diclofenac (VOLTAREN) 50 MG EC tablet Take 1 tablet (50 mg total) by mouth 2 (two) times daily. 08/29/18   Nat Christen, MD  glucose blood test strip 1 each by Other route daily. Dx E11.9 E11.69 06/30/18   Isaac Bliss, Rayford Halsted, MD  HYDROcodone-acetaminophen (NORCO/VICODIN) 5-325 MG tablet Take 1 tablet by mouth every 4 (four) hours as needed. One tab q4h prn pain 08/29/18   Nat Christen, MD  predniSONE (DELTASONE) 10 MG tablet 3 tablets for 3 days, 2 tablets for 3 days, 1 tablet for 3 days 08/29/18   Nat Christen, MD    Family History Family History  Problem Relation Age of Onset  . Diabetes type II Mother   . Hypertension Mother   . Stroke Mother   . Cancer Father        patient does not know what type of cancer  . Diabetes Daughter   . Breast cancer Neg Hx   . Colon polyps Neg Hx   . Crohn's disease Neg Hx   . Esophageal cancer Neg Hx   . Rectal cancer Neg Hx   . Stomach cancer Neg Hx     Social History Social History   Tobacco Use  . Smoking status: Never Smoker  . Smokeless tobacco: Never Used  Substance Use Topics  . Alcohol use: Yes    Alcohol/week: 1.0 standard drinks    Types: 1 Cans of beer per week    Comment: 1/2 bottle of wine and some beer a day  . Drug use: Not Currently    Frequency: 1.0 times per week    Types: Cocaine    Comment: last use cocaine last year 2018     Allergies   Patient has no known allergies.   Review of Systems Review of Systems  All other  systems reviewed and are negative.    Physical Exam Updated Vital Signs BP (!) 170/80 (BP Location: Right Arm)   Pulse 78   Temp 98 F (36.7 C) (Oral)   Resp 16   SpO2 98%   Physical Exam Vitals signs and nursing note reviewed.  Constitutional:      Appearance: She is well-developed.  HENT:     Head: Normocephalic and  atraumatic.  Eyes:     Conjunctiva/sclera: Conjunctivae normal.  Neck:     Musculoskeletal: Neck supple.  Cardiovascular:     Rate and Rhythm: Normal rate and regular rhythm.  Pulmonary:     Effort: Pulmonary effort is normal.     Breath sounds: Normal breath sounds.  Abdominal:     General: Bowel sounds are normal.     Palpations: Abdomen is soft.  Musculoskeletal: Normal range of motion.  Skin:    General: Skin is warm and dry.  Neurological:     Mental Status: She is alert and oriented to person, place, and time.     Comments: Tender over the distribution of the right sciatic nerve.  Pain with hip flexion and straight leg raise.  Psychiatric:        Behavior: Behavior normal.      ED Treatments / Results  Labs (all labs ordered are listed, but only abnormal results are displayed) Labs Reviewed - No data to display  EKG None  Radiology No results found.  Procedures Procedures (including critical care time)  Medications Ordered in ED Medications  ketorolac (TORADOL) injection 60 mg (60 mg Intramuscular Given 08/29/18 1211)     Initial Impression / Assessment and Plan / ED Course  I have reviewed the triage vital signs and the nursing notes.  Pertinent labs & imaging results that were available during my care of the patient were reviewed by me and considered in my medical decision making (see chart for details).        History and physical most consistent with right sided sciatica and radiculopathy.  Rx Toradol 60 mg IM in the ED.  Discharge medication Voltaren 50 mg, prednisone 10 mg, Vicodin.  Follow-up with primary care.  Final  Clinical Impressions(s) / ED Diagnoses   Final diagnoses:  Right lumbosacral radiculopathy    ED Discharge Orders         Ordered    diclofenac (VOLTAREN) 50 MG EC tablet  2 times daily     08/29/18 1340    predniSONE (DELTASONE) 10 MG tablet     08/29/18 1340    HYDROcodone-acetaminophen (NORCO/VICODIN) 5-325 MG tablet  Every 4 hours PRN     08/29/18 1340           Nat Christen, MD 08/29/18 1446

## 2018-08-31 ENCOUNTER — Telehealth: Payer: Self-pay | Admitting: Neurology

## 2018-08-31 NOTE — Telephone Encounter (Signed)
Called patient and rescheduled NCV/EMG to the end of April.

## 2018-08-31 NOTE — Telephone Encounter (Signed)
Is this patient's NCV/EMG urgent? Or should it be rescheduled for a later date?

## 2018-08-31 NOTE — Telephone Encounter (Signed)
emg can wait

## 2018-09-04 ENCOUNTER — Emergency Department (HOSPITAL_COMMUNITY)
Admission: EM | Admit: 2018-09-04 | Discharge: 2018-09-04 | Disposition: A | Discharge status: Home / Self Care | Payer: Medicare Other | Attending: Emergency Medicine | Admitting: Emergency Medicine

## 2018-09-04 ENCOUNTER — Other Ambulatory Visit: Payer: Self-pay

## 2018-09-04 ENCOUNTER — Telehealth: Payer: Self-pay | Admitting: *Deleted

## 2018-09-04 ENCOUNTER — Encounter: Payer: Medicare Other | Admitting: Neurology

## 2018-09-04 ENCOUNTER — Encounter (HOSPITAL_COMMUNITY): Payer: Self-pay | Admitting: *Deleted

## 2018-09-04 DIAGNOSIS — Z8673 Personal history of transient ischemic attack (TIA), and cerebral infarction without residual deficits: Secondary | ICD-10-CM | POA: Diagnosis not present

## 2018-09-04 DIAGNOSIS — E119 Type 2 diabetes mellitus without complications: Secondary | ICD-10-CM | POA: Diagnosis not present

## 2018-09-04 DIAGNOSIS — M5410 Radiculopathy, site unspecified: Secondary | ICD-10-CM | POA: Diagnosis not present

## 2018-09-04 DIAGNOSIS — Z79899 Other long term (current) drug therapy: Secondary | ICD-10-CM | POA: Diagnosis not present

## 2018-09-04 DIAGNOSIS — I10 Essential (primary) hypertension: Secondary | ICD-10-CM | POA: Insufficient documentation

## 2018-09-04 DIAGNOSIS — Z7982 Long term (current) use of aspirin: Secondary | ICD-10-CM | POA: Insufficient documentation

## 2018-09-04 DIAGNOSIS — Z7984 Long term (current) use of oral hypoglycemic drugs: Secondary | ICD-10-CM | POA: Diagnosis not present

## 2018-09-04 DIAGNOSIS — M79604 Pain in right leg: Secondary | ICD-10-CM | POA: Diagnosis present

## 2018-09-04 MED ORDER — KETOROLAC TROMETHAMINE 30 MG/ML IJ SOLN
30.0000 mg | Freq: Once | INTRAMUSCULAR | Status: AC
  Administered 2018-09-04: 30 mg via INTRAMUSCULAR
  Filled 2018-09-04: qty 1

## 2018-09-04 NOTE — ED Triage Notes (Signed)
Patient verbalizes understanding of discharge instructions . Opportunity for questions and answers were provided . Armband removed by staff ,Pt discharged from ED. W/C  offered at D/C  and Declined W/C at D/C and was escorted to lobby by RN.  

## 2018-09-04 NOTE — Discharge Instructions (Addendum)
Please read attached information. If you experience any new or worsening signs or symptoms please return to the emergency room for evaluation. Please follow-up with your primary care provider or specialist as discussed.  °

## 2018-09-04 NOTE — ED Notes (Signed)
Patient verbalizes understanding of discharge instructions . Opportunity for questions and answers were provided . Armband removed by staff ,Pt discharged from ED. W/C  offered at D/C  and Declined W/C at D/C and was escorted to lobby by RN.  

## 2018-09-04 NOTE — Telephone Encounter (Signed)
Patient is requesting a follow up visit from the ER.  She usually gets an injection for her pain.  She does not access to a camera or a smart phone.  Please advise.

## 2018-09-04 NOTE — ED Provider Notes (Signed)
[facility] EMERGENCY DEPARTMENT Provider Note   CSN: [id number] Arrival date & time: 09/04/18  0845   History   Chief Complaint Chief Complaint  Patient presents with  . Leg Pain    HPI Deborah Wise is a 68 y.o. female.     HPI    68 year old female presents today with complaints of right-sided leg pain.  Patient is two-week history of a ache down the posterior aspect of her leg to her knee.  She denies any loss of distal sensation strength and motor function.  She denies any trauma.  She has been seen twice for this.  She had plain films which she noted had arthritis.  She was given steroids, diclofenac, and Decadron.  She notes that the steroid she received did improve her symptoms.  She denies any change in symptoms.  She notes she has been unable to reach her primary care provider office.  She denies any fevers.   Past Medical History:  Diagnosis Date  . Arthritis   . Carotid artery occlusion   . Cirrhosis ([place])   . Diabetes mellitus without complication ([place])   . Fibroid, uterine   . GERD (gastroesophageal reflux disease)   . Hepatitis    hep C  . Hypertension   . Stroke ([place])   . Thrombocytopenia ([place]) 12/25/2014  . Urinary incontinence    Patient noticed mild and is not currently a significant problem    Patient Active Problem List   Diagnosis Date Noted  . Cirrhosis of liver ([place]) 06/22/2018  . Chronic hepatitis ([place]) 06/22/2018  . Hyperlipidemia associated with type 2 diabetes mellitus ([place]) 06/22/2018  . Carotid artery disease ([place]) 06/22/2018  . 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 Aneurysm of middle cerebral artery   . Cerebrovascular accident (CVA) due to thrombosis of left vertebral artery ([place]) 06/28/2016  . Cocaine abuse ([place])   . Stroke (cerebrum) ([place]) 06/27/2016  . Microcytic anemia 12/27/2014  . Thrombocytopenia ([place]) 12/25/2014  . Hypoglycemia 07/21/2013  . Diabetes mellitus ([place]) 07/21/2013  .  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 Liver disease 07/21/2013  . Alcohol abuse 07/21/2013    Past Surgical History:  Procedure Laterality Date  . ANKLE FRACTURE SURGERY Right 1999   x 2, rod  . IR GENERIC HISTORICAL  06/30/2016   IR ANGIO VERTEBRAL SEL SUBCLAVIAN INNOMINATE UNI R MOD SED 06/30/2016 [name], MD [facility]  . IR GENERIC HISTORICAL  06/30/2016   IR ANGIO VERTEBRAL SEL VERTEBRAL UNI L MOD SED 06/30/2016 [name], MD [facility]  . IR GENERIC HISTORICAL  06/30/2016   IR ANGIO INTRA EXTRACRAN SEL COM CAROTID INNOMINATE BILAT MOD SED 06/30/2016 [name], MD [facility]  . MULTIPLE EXTRACTIONS WITH ALVEOLOPLASTY  10/25/2011   Procedure: MULTIPLE EXTRACION WITH ALVEOLOPLASTY;  Surgeon: Gae Bon, DDS;  Location: [facility];  Service: Oral Surgery;  Laterality: Bilateral;  Extract teeth numbers one, two, six, seven, eight, nine, ten, eleven, twelve, fifteen, sixteen, eighteen, nineteen, twenty-three, twenty-four, twenty-five, twenty-seven, thirty-two and alveoplasty.     OB History    Gravida  5   Para  3   Term  3   Preterm      AB  2   Living  3     SAB  1   TAB  1   Ectopic      Multiple      Live Births  Home Medications    Prior to Admission medications   Medication Sig Start Date End Date Taking? Authorizing Provider  ACCU-CHEK SOFTCLIX LANCETS lancets Accu-Chek Softclix Lancets  USE AS DIRECTED TO CHECK BLOOD SUGAR TWICE A DAY    [provider]  amLODipine (NORVASC) 10 MG tablet Take 1 tablet (10 mg total) by mouth daily. 09/01/16   Kirsteins, Luanna Salk, MD  aspirin EC 81 MG tablet Take 1 tablet (81 mg total) by mouth daily. 06/22/18   Isaac Bliss, Rayford Halsted, MD  atorvastatin (LIPITOR) 80 MG tablet Take 1 tablet (80 mg total) by mouth daily. 06/22/18   Isaac Bliss, Rayford Halsted, MD  Blood Glucose Monitoring Suppl (ACCU-CHEK GUIDE) w/Device KIT Please specify directions, refills and quantity  07/04/18   Isaac Bliss, Rayford Halsted, MD  diclofenac (VOLTAREN) 50 MG EC tablet Take 1 tablet (50 mg total) by mouth 2 (two) times daily. 08/29/18   Nat Christen, MD  Evolocumab (REPATHA SURECLICK) 924 MG/ML SOAJ Inject 140 mg into the skin every 14 (fourteen) days. 08/07/18   Garvin Fila, MD  glucose blood test strip 1 each by Other route daily. Dx E11.9 E11.69 06/30/18   Isaac Bliss, Rayford Halsted, MD  hydrochlorothiazide (HYDRODIURIL) 25 MG tablet Take 1 tablet (25 mg total) by mouth daily. 08/03/18   Isaac Bliss, Rayford Halsted, MD  HYDROcodone-acetaminophen (NORCO/VICODIN) 5-325 MG tablet Take 1 tablet by mouth every 4 (four) hours as needed. One tab q4h prn pain 08/29/18   Nat Christen, MD  lisinopril (PRINIVIL,ZESTRIL) 40 MG tablet Take 1 tablet (40 mg total) by mouth daily. 08/03/18   Isaac Bliss, Rayford Halsted, MD  meloxicam (MOBIC) 15 MG tablet meloxicam 15 mg tablet  TAKE 1 TABLET BY MOUTh twice daily Patient taking differently: Take 15 mg by mouth 2 (two) times daily.  06/30/18   Isaac Bliss, Rayford Halsted, MD  metFORMIN (GLUCOPHAGE) 1000 MG tablet Take 500 mg by mouth daily.  11/22/16   [provider]  predniSONE (DELTASONE) 10 MG tablet 3 tablets for 3 days, 2 tablets for 3 days, 1 tablet for 3 days 08/29/18   Nat Christen, MD    Family History Family History  Problem Relation Age of Onset  . Diabetes type II Mother   . Hypertension Mother   . Stroke Mother   . Cancer Father        patient does not know what type of cancer  . Diabetes Daughter   . Breast cancer Neg Hx   . Colon polyps Neg Hx   . Crohn's disease Neg Hx   . Esophageal cancer Neg Hx   . Rectal cancer Neg Hx   . Stomach cancer Neg Hx     Social History Social History   Tobacco Use  . Smoking status: Never Smoker  . Smokeless tobacco: Never Used  Substance Use Topics  . Alcohol use: Yes    Alcohol/week: 1.0 standard drinks    Types: 1 Cans of beer per week    Comment: 1/2 bottle of wine and some beer  a day  . Drug use: Not Currently    Frequency: 1.0 times per week    Types: Cocaine    Comment: last use cocaine last year 2018     Allergies   Patient has no known allergies.   Review of Systems Review of Systems  All other systems reviewed and are negative.    Physical Exam Updated Vital Signs BP (!) 155/74 (BP Location: Right Arm)  Pulse 93   Temp 98.1 F (36.7 C) (Oral)   Resp 20   SpO2 99%   Physical Exam Vitals signs and nursing note reviewed.  Constitutional:      Appearance: She is well-developed.  HENT:     Head: Normocephalic and atraumatic.  Eyes:     General: No scleral icterus.       Right eye: No discharge.        Left eye: No discharge.     Conjunctiva/sclera: Conjunctivae normal.     Pupils: Pupils are equal, round, and reactive to light.  Neck:     Musculoskeletal: Normal range of motion.     Vascular: No JVD.     Trachea: No tracheal deviation.  Pulmonary:     Effort: Pulmonary effort is normal.     Breath sounds: No stridor.  Musculoskeletal:     Comments: Straight leg negative bilateral distal sensation strength motor function intact, back nontender to palpation  Neurological:     Mental Status: She is alert and oriented to person, place, and time.     Coordination: Coordination normal.  Psychiatric:        Behavior: Behavior normal.        Thought Content: Thought content normal.        Judgment: Judgment normal.      ED Treatments / Results  Labs (all labs ordered are listed, but only abnormal results are displayed) Labs Reviewed - No data to display  EKG None  Radiology No results found.  Procedures Procedures (including critical care time)  Medications Ordered in ED Medications  ketorolac (TORADOL) 30 MG/ML injection 30 mg (30 mg Intramuscular Given 09/04/18 0912)     Initial Impression / Assessment and Plan / ED Course  I have reviewed the triage vital signs and the nursing notes.  Pertinent labs & imaging  results that were available during my care of the patient were reviewed by me and considered in my medical decision making (see chart for details).        68 year old female presents today with leg pain.  This is likely radicular pain.  She has no red flags here today.  Patient encouraged to and using previously prescribed medications outpatient follow-up with primary care provider return precautions given.  She verbalized understanding and agreement to today's plan.  Final Clinical Impressions(s) / ED Diagnoses   Final diagnoses:  Radiculopathy, unspecified spinal region    ED Discharge Orders    None       Francee Gentile 09/04/18 0945    Carmin Muskrat, MD 09/05/18 [phone]

## 2018-09-05 NOTE — Telephone Encounter (Signed)
Spoke with patient and an office visit was scheduled for 09/07/2018 at 1 pm.  I will call the patient the morning of 09/07/2018 to pre-screen for Covid 19.

## 2018-09-05 NOTE — Telephone Encounter (Signed)
She might benefit from toradol and depomedrol in office for her sciatica. Call her and screen her for COVID 19. If no symptoms, have her come in for a live visit, o/w we will have to deal with webex and oral meds.

## 2018-09-07 ENCOUNTER — Other Ambulatory Visit: Payer: Self-pay

## 2018-09-07 ENCOUNTER — Ambulatory Visit (INDEPENDENT_AMBULATORY_CARE_PROVIDER_SITE_OTHER): Payer: Medicare Other | Admitting: Internal Medicine

## 2018-09-07 ENCOUNTER — Encounter: Payer: Self-pay | Admitting: Internal Medicine

## 2018-09-07 VITALS — BP 130/80 | HR 96 | Temp 98.3°F | Wt 185.9 lb

## 2018-09-07 DIAGNOSIS — I10 Essential (primary) hypertension: Secondary | ICD-10-CM

## 2018-09-07 DIAGNOSIS — E1169 Type 2 diabetes mellitus with other specified complication: Secondary | ICD-10-CM | POA: Diagnosis not present

## 2018-09-07 DIAGNOSIS — D509 Iron deficiency anemia, unspecified: Secondary | ICD-10-CM | POA: Diagnosis not present

## 2018-09-07 DIAGNOSIS — E785 Hyperlipidemia, unspecified: Secondary | ICD-10-CM

## 2018-09-07 DIAGNOSIS — M5431 Sciatica, right side: Secondary | ICD-10-CM

## 2018-09-07 LAB — BASIC METABOLIC PANEL
BUN: 27 mg/dL — ABNORMAL HIGH (ref 6–23)
CO2: 28 mEq/L (ref 19–32)
Calcium: 10.1 mg/dL (ref 8.4–10.5)
Chloride: 105 mEq/L (ref 96–112)
Creatinine, Ser: 0.8 mg/dL (ref 0.40–1.20)
GFR: 86.68 mL/min (ref >60.00)
Glucose, Bld: 85 mg/dL (ref 70–99)
Potassium: 4.9 mEq/L (ref 3.5–5.1)
Sodium: 141 mEq/L (ref 135–145)

## 2018-09-07 MED ORDER — PREDNISONE 10 MG (21) PO TBPK: ORAL_TABLET | ORAL | 0 refills | Status: DC

## 2018-09-07 MED ORDER — ATORVASTATIN CALCIUM 80 MG PO TABS: 80.0000 mg | ORAL_TABLET | Freq: Every day | ORAL | 3 refills | Status: DC

## 2018-09-07 MED ORDER — MELOXICAM 7.5 MG PO TABS: 7.5000 mg | ORAL_TABLET | Freq: Every day | ORAL | 1 refills | Status: DC

## 2018-09-07 MED ORDER — METHYLPREDNISOLONE ACETATE 80 MG/ML IJ SUSP
80.0000 mg | Freq: Once | INTRAMUSCULAR | Status: AC
  Administered 2018-09-07: 80 mg via INTRAMUSCULAR

## 2018-09-07 MED ORDER — AMLODIPINE BESYLATE 10 MG PO TABS: 10.0000 mg | ORAL_TABLET | Freq: Every day | ORAL | 1 refills | Status: DC

## 2018-09-07 MED ORDER — LISINOPRIL 40 MG PO TABS: 40.0000 mg | ORAL_TABLET | Freq: Every day | ORAL | 3 refills | Status: DC

## 2018-09-07 MED ORDER — CYCLOBENZAPRINE HCL 5 MG PO TABS: 5.0000 mg | ORAL_TABLET | Freq: Two times a day (BID) | ORAL | 1 refills | Status: DC | PRN

## 2018-09-07 MED ORDER — KETOROLAC TROMETHAMINE 60 MG/2ML IM SOLN
60.0000 mg | Freq: Once | INTRAMUSCULAR | Status: AC
  Administered 2018-09-07: 60 mg via INTRAMUSCULAR

## 2018-09-07 NOTE — Patient Instructions (Signed)
-Nice seeing you today!  -Labwork and Xray today. Will notify you of results.  -Make sure you do back stretches daily.  -Start taking prednisone as directed for 6 days, meloxicam daily as needed for pain relief, flexeril up to twice daily as needed for muscle spasms.  -May apply ice twice daily to lower back and buttocks for pain relief.   Sciatica  Sciatica is pain, numbness, weakness, or tingling along your sciatic nerve. The sciatic nerve starts in the lower back and goes down the back of each leg. Sciatica happens when this nerve is pinched or has pressure put on it. Sciatica usually goes away on its own or with treatment. Sometimes, sciatica may keep coming back (recur). Follow these instructions at home: Medicines  Take over-the-counter and prescription medicines only as told by your doctor.  Do not drive or use heavy machinery while taking prescription pain medicine. Managing pain  If directed, put ice on the affected area. ? Put ice in a plastic bag. ? Place a towel between your skin and the bag. ? Leave the ice on for 20 minutes, 2-3 times a day.  After icing, apply heat to the affected area before you exercise or as often as told by your doctor. Use the heat source that your doctor tells you to use, such as a moist heat pack or a heating pad. ? Place a towel between your skin and the heat source. ? Leave the heat on for 20-30 minutes. ? Remove the heat if your skin turns bright red. This is especially important if you are unable to feel pain, heat, or cold. You may have a greater risk of getting burned. Activity  Return to your normal activities as told by your doctor. Ask your doctor what activities are safe for you. ? Avoid activities that make your sciatica worse.  Take short rests during the day. Rest in a lying or standing position. This is usually better than sitting to rest. ? When you rest for a long time, do some physical activity or stretching between periods  of rest. ? Avoid sitting for a long time without moving. Get up and move around at least one time each hour.  Exercise and stretch regularly, as told by your doctor.  Do not lift anything that is heavier than 10 lb (4.5 kg) while you have symptoms of sciatica. ? Avoid lifting heavy things even when you do not have symptoms. ? Avoid lifting heavy things over and over.  When you lift objects, always lift in a way that is safe for your body. To do this, you should: ? Bend your knees. ? Keep the object close to your body. ? Avoid twisting. General instructions  Use good posture. ? Avoid leaning forward when you are sitting. ? Avoid hunching over when you are standing.  Stay at a healthy weight.  Wear comfortable shoes that support your feet. Avoid wearing high heels.  Avoid sleeping on a mattress that is too soft or too hard. You might have less pain if you sleep on a mattress that is firm enough to support your back.  Keep all follow-up visits as told by your doctor. This is important. Contact a doctor if:  You have pain that: ? Wakes you up when you are sleeping. ? Gets worse when you lie down. ? Is worse than the pain you have had in the past. ? Lasts longer than 4 weeks.  You lose weight for without trying. Get help right away  if:  You cannot control when you pee (urinate) or poop (have a bowel movement).  You have weakness in any of these areas and it gets worse. ? Lower back. ? Lower belly (pelvis). ? Butt (buttocks). ? Legs.  You have redness or swelling of your back.  You have a burning feeling when you pee. This information is not intended to replace advice given to you by your health care provider. Make sure you discuss any questions you have with your health care provider. Document Released: 03/02/2008 Document Revised: 10/30/2015 Document Reviewed: 01/31/2015 Elsevier Interactive Patient Education  2019 [employer].

## 2018-09-07 NOTE — Addendum Note (Signed)
Addended by: Elmer Picker on: 09/07/2018 01:20 PM   Modules accepted: Orders

## 2018-09-07 NOTE — Telephone Encounter (Signed)
Spoke with patient and she denies any fever, cough, shortness of breath, drainage, or travel.

## 2018-09-07 NOTE — Progress Notes (Signed)
Acute Office Visit     CC/Reason for Visit: right sided lower back and leg pain  HPI: Deborah Wise is a 68 y.o. female who is coming in today for the above mentioned reasons. She has been to th ED twice, on 3/24 and 3/30 for evaluation of these symptoms. She was given steroids, muscle relaxers and antiinflammatories with partial relief. Pain is over her right buttocks and radiating down the back and lateral thigh. She states sitting down is painful but so is walking. No fevers. No N/V.  We recently up titrated her lisinopril dose due to continued elevated BP readings.   On lab work from January, noted to have microcytic anemia. Iron studies are pending.  Requests refills on statin today.  Past Medical/Surgical History: Past Medical History:  Diagnosis Date   Arthritis    Carotid artery occlusion    Cirrhosis ([place])    Diabetes mellitus without complication (HCC)    Fibroid, uterine    GERD (gastroesophageal reflux disease)    Hepatitis    hep C   Hypertension    Stroke (HCC)    Thrombocytopenia ([place]) 12/25/2014   Urinary incontinence    Patient noticed mild and is not currently a significant problem    Past Surgical History:  Procedure Laterality Date   ANKLE FRACTURE SURGERY Right 1999   x 2, rod   IR GENERIC HISTORICAL  06/30/2016   IR ANGIO VERTEBRAL SEL SUBCLAVIAN INNOMINATE UNI R MOD SED 06/30/2016 [name], MD [facility]   IR GENERIC HISTORICAL  06/30/2016   IR ANGIO VERTEBRAL SEL VERTEBRAL UNI L MOD SED 06/30/2016 [name], MD [facility]   IR GENERIC HISTORICAL  06/30/2016   IR ANGIO INTRA EXTRACRAN SEL COM CAROTID INNOMINATE BILAT MOD SED 06/30/2016 [name], MD [facility]   MULTIPLE EXTRACTIONS WITH ALVEOLOPLASTY  10/25/2011   Procedure: MULTIPLE EXTRACION WITH ALVEOLOPLASTY;  Surgeon: Gae Bon, DDS;  Location: [facility];  Service: Oral Surgery;  Laterality: Bilateral;  Extract teeth numbers one, two, six,  seven, eight, nine, ten, eleven, twelve, fifteen, sixteen, eighteen, nineteen, twenty-three, twenty-four, twenty-five, twenty-seven, thirty-two and alveoplasty.    Social History:  reports that she has never smoked. She has never used smokeless tobacco. She reports current alcohol use of about 1.0 standard drinks of alcohol per week. She reports previous drug use. Frequency: 1.00 time per week. Drug: Cocaine.  Allergies: No Known Allergies  Family History:  Family History  Problem Relation Age of Onset   Diabetes type II Mother    Hypertension Mother    Stroke Mother    Cancer Father        patient does not know what type of cancer   Diabetes Daughter    Breast cancer Neg Hx    Colon polyps Neg Hx    Crohn's disease Neg Hx    Esophageal cancer Neg Hx    Rectal cancer Neg Hx    Stomach cancer Neg Hx      Current Outpatient Medications:    ACCU-CHEK SOFTCLIX LANCETS lancets, Accu-Chek Softclix Lancets  USE AS DIRECTED TO CHECK BLOOD SUGAR TWICE A DAY, Disp: , Rfl:    amLODipine (NORVASC) 10 MG tablet, Take 1 tablet (10 mg total) by mouth daily., Disp: 90 tablet, Rfl: 1   aspirin EC 81 MG tablet, Take 1 tablet (81 mg total) by mouth daily., Disp: 31 tablet, Rfl: 11   atorvastatin (LIPITOR) 80 MG tablet, Take 1 tablet (80 mg total)  by mouth daily., Disp: 90 tablet, Rfl: 3   Blood Glucose Monitoring Suppl (ACCU-CHEK GUIDE) w/Device KIT, Please specify directions, refills and quantity, Disp: 1 kit, Rfl: 0   diclofenac (VOLTAREN) 50 MG EC tablet, Take 1 tablet (50 mg total) by mouth 2 (two) times daily., Disp: 20 tablet, Rfl: 0   Evolocumab (REPATHA SURECLICK) 235 MG/ML SOAJ, Inject 140 mg into the skin every 14 (fourteen) days., Disp: 2 pen, Rfl: 3   glucose blood test strip, 1 each by Other route daily. Dx E11.9 E11.69, Disp: 100 each, Rfl: 3   HYDROcodone-acetaminophen (NORCO/VICODIN) 5-325 MG tablet, Take 1 tablet by mouth every 4 (four) hours as needed. One tab  q4h prn pain, Disp: 15 tablet, Rfl: 0   lisinopril (PRINIVIL,ZESTRIL) 40 MG tablet, Take 1 tablet (40 mg total) by mouth daily., Disp: 90 tablet, Rfl: 3   metFORMIN (GLUCOPHAGE) 1000 MG tablet, Take 500 mg by mouth daily. , Disp: , Rfl: 2   cyclobenzaprine (FLEXERIL) 5 MG tablet, Take 1 tablet (5 mg total) by mouth 2 (two) times daily as needed for muscle spasms., Disp: 30 tablet, Rfl: 1   meloxicam (MOBIC) 7.5 MG tablet, Take 1 tablet (7.5 mg total) by mouth daily., Disp: 30 tablet, Rfl: 1   predniSONE (STERAPRED UNI-PAK 21 TAB) 10 MG (21) TBPK tablet, Take as directed, Disp: 21 tablet, Rfl: 0  Current Facility-Administered Medications:    0.9 %  sodium chloride infusion, 500 mL, Intravenous, Once, Irene Shipper, MD   ketorolac (TORADOL) injection 60 mg, 60 mg, Intramuscular, Once, Isaac Bliss, Rayford Halsted, MD   methylPREDNISolone acetate (DEPO-MEDROL) injection 80 mg, 80 mg, Intramuscular, Once, Isaac Bliss, Rayford Halsted, MD  Review of Systems:  Constitutional: Denies fever, chills, diaphoresis, appetite change and fatigue.  HEENT: Denies photophobia, eye pain, redness, hearing loss, ear pain, congestion, sore throat, rhinorrhea, sneezing, mouth sores, trouble swallowing, neck pain, neck stiffness and tinnitus.   Respiratory: Denies SOB, DOE, cough, chest tightness,  and wheezing.   Cardiovascular: Denies chest pain, palpitations and leg swelling.  Gastrointestinal: Denies nausea, vomiting, abdominal pain, diarrhea, constipation, blood in stool and abdominal distention.  Genitourinary: Denies dysuria, urgency, frequency, hematuria, flank pain and difficulty urinating.  Endocrine: Denies: hot or cold intolerance, sweats, changes in hair or nails, polyuria, polydipsia. Musculoskeletal: Denies  joint swelling, arthralgias. Skin: Denies pallor, rash and wound.  Neurological: Denies dizziness, seizures, syncope, weakness, light-headedness, numbness and headaches.  Hematological: Denies  adenopathy. Easy bruising, personal or family bleeding history  Psychiatric/Behavioral: Denies suicidal ideation, mood changes, confusion, nervousness, sleep disturbance and agitation    Physical Exam: Vitals:   09/07/18 1257  BP: 130/80  Pulse: 96  Temp: 98.3 F (36.8 C)  TempSrc: Oral  SpO2: 97%  Weight: 185 lb 14.4 oz (84.3 kg)    Body mass index is 30.94 kg/m.   Constitutional: NAD, calm, comfortable Eyes: PERRL, lids and conjunctivae normal ENMT: Mucous membranes are moist.   Abdomen: no tenderness, no masses palpated. No hepatosplenomegaly. Bowel sounds positive.  Musculoskeletal: no clubbing / cyanosis. No joint deformity upper and lower extremities. Good ROM, no contractures. Normal muscle tone.  Neurologic: CN 2-12 grossly intact. Sensation intact, DTR normal. Strength 5/5 in all 4.  Psychiatric: Normal judgment and insight. Alert and oriented x 3. Normal mood.    Impression and Plan:  Right sided sciatica  -Medrol and toradol in office. -Sent home with pred taper, meloxicam and flexeril. -Will check LS xray. -Given information on back stretches she should perform  BID. -Ice recommended.  Malignant hypertension  -BP well controlled today. -Refilled norvasc and lisinopril. -check BMET due to recently increased lisinopril dose.  Hyperlipidemia associated with type 2 diabetes mellitus (HCC) - -Refill lipitor. LDL 125. Need to discuss increasing dose at next visit.  Microcytic Anemia -Iron studies today.    Patient Instructions  -Nice seeing you today!  -Labwork and Xray today. Will notify you of results.  -Make sure you do back stretches daily.  -Start taking prednisone as directed for 6 days, meloxicam daily as needed for pain relief, flexeril up to twice daily as needed for muscle spasms.  -May apply ice twice daily to lower back and buttocks for pain relief.   Sciatica  Sciatica is pain, numbness, weakness, or tingling along your sciatic  nerve. The sciatic nerve starts in the lower back and goes down the back of each leg. Sciatica happens when this nerve is pinched or has pressure put on it. Sciatica usually goes away on its own or with treatment. Sometimes, sciatica may keep coming back (recur). Follow these instructions at home: Medicines  Take over-the-counter and prescription medicines only as told by your doctor.  Do not drive or use heavy machinery while taking prescription pain medicine. Managing pain  If directed, put ice on the affected area. ? Put ice in a plastic bag. ? Place a towel between your skin and the bag. ? Leave the ice on for 20 minutes, 2-3 times a day.  After icing, apply heat to the affected area before you exercise or as often as told by your doctor. Use the heat source that your doctor tells you to use, such as a moist heat pack or a heating pad. ? Place a towel between your skin and the heat source. ? Leave the heat on for 20-30 minutes. ? Remove the heat if your skin turns bright red. This is especially important if you are unable to feel pain, heat, or cold. You may have a greater risk of getting burned. Activity  Return to your normal activities as told by your doctor. Ask your doctor what activities are safe for you. ? Avoid activities that make your sciatica worse.  Take short rests during the day. Rest in a lying or standing position. This is usually better than sitting to rest. ? When you rest for a long time, do some physical activity or stretching between periods of rest. ? Avoid sitting for a long time without moving. Get up and move around at least one time each hour.  Exercise and stretch regularly, as told by your doctor.  Do not lift anything that is heavier than 10 lb (4.5 kg) while you have symptoms of sciatica. ? Avoid lifting heavy things even when you do not have symptoms. ? Avoid lifting heavy things over and over.  When you lift objects, always lift in a way that is  safe for your body. To do this, you should: ? Bend your knees. ? Keep the object close to your body. ? Avoid twisting. General instructions  Use good posture. ? Avoid leaning forward when you are sitting. ? Avoid hunching over when you are standing.  Stay at a healthy weight.  Wear comfortable shoes that support your feet. Avoid wearing high heels.  Avoid sleeping on a mattress that is too soft or too hard. You might have less pain if you sleep on a mattress that is firm enough to support your back.  Keep all follow-up visits as told by  your doctor. This is important. Contact a doctor if:  You have pain that: ? Wakes you up when you are sleeping. ? Gets worse when you lie down. ? Is worse than the pain you have had in the past. ? Lasts longer than 4 weeks.  You lose weight for without trying. Get help right away if:  You cannot control when you pee (urinate) or poop (have a bowel movement).  You have weakness in any of these areas and it gets worse. ? Lower back. ? Lower belly (pelvis). ? Butt (buttocks). ? Legs.  You have redness or swelling of your back.  You have a burning feeling when you pee. This information is not intended to replace advice given to you by your health care provider. Make sure you discuss any questions you have with your health care provider. Document Released: 03/02/2008 Document Revised: 10/30/2015 Document Reviewed: 01/31/2015 Elsevier Interactive Patient Education  2019 [place], [place] [facility]

## 2018-09-11 LAB — ANEMIA PANEL
Ferritin: 132 ng/mL (ref 15–150)
Hematocrit: 38.2 % (ref 34.0–46.6)
Iron Saturation: 54 % (ref 15–55)
Iron: 198 ug/dL — ABNORMAL HIGH (ref 27–139)
Retic Ct Pct: 2.1 % (ref 0.6–2.6)
Total Iron Binding Capacity: 367 ug/dL (ref 250–450)
UIBC: 169 ug/dL (ref 118–369)
Vitamin B-12: 274 pg/mL (ref 232–1245)

## 2018-09-22 ENCOUNTER — Other Ambulatory Visit: Payer: Self-pay | Admitting: Internal Medicine

## 2018-09-22 ENCOUNTER — Telehealth: Payer: Self-pay | Admitting: *Deleted

## 2018-09-22 DIAGNOSIS — M5431 Sciatica, right side: Secondary | ICD-10-CM

## 2018-09-22 DIAGNOSIS — Z9181 History of falling: Secondary | ICD-10-CM

## 2018-09-22 MED ORDER — PREDNISONE 10 MG (21) PO TBPK: ORAL_TABLET | ORAL | 0 refills | Status: DC

## 2018-09-22 NOTE — Telephone Encounter (Signed)
Spoke with patient. Informed patient of Prednisone being reordered as well as referral to PT. Patient verbalized understanding. Patient would like to know can we also put in a referral for [facility] aide so she does not fall and hurt herself.

## 2018-09-22 NOTE — Telephone Encounter (Signed)
Fine with me

## 2018-09-22 NOTE — Telephone Encounter (Signed)
Patient was seen 4/2 for right sided Sciatica and has taken the Prednisone as rx'd. Patient calls today verbalizing pain is still their and in both legs as well. Patient reports she has been doing exercises and applying ice twice daily. No appointments available in office. Patient wants to know if a refill for Prednisone can be called in. Patient verbalizes she can't go back to hospital.

## 2018-09-22 NOTE — Telephone Encounter (Signed)
Prednisone reordered. Can we schedule PT as well?

## 2018-09-25 NOTE — Telephone Encounter (Signed)
Order for HH placed.  

## 2018-10-02 ENCOUNTER — Encounter: Payer: Medicare Other | Admitting: Neurology

## 2018-10-04 ENCOUNTER — Ambulatory Visit: Payer: Medicare Other

## 2018-10-05 NOTE — Telephone Encounter (Signed)
Pt calling to check status. Pt states that no one has called about HH. Please advise.

## 2018-10-06 NOTE — Telephone Encounter (Signed)
Referral sent to [facility]. 

## 2018-10-11 ENCOUNTER — Encounter: Payer: Self-pay | Admitting: Physical Therapy

## 2018-10-11 ENCOUNTER — Ambulatory Visit: Payer: Medicare Other | Attending: Internal Medicine | Admitting: Physical Therapy

## 2018-10-11 ENCOUNTER — Other Ambulatory Visit: Payer: Self-pay

## 2018-10-11 DIAGNOSIS — M5441 Lumbago with sciatica, right side: Secondary | ICD-10-CM | POA: Insufficient documentation

## 2018-10-11 DIAGNOSIS — G8929 Other chronic pain: Secondary | ICD-10-CM | POA: Diagnosis present

## 2018-10-11 DIAGNOSIS — R296 Repeated falls: Secondary | ICD-10-CM | POA: Diagnosis present

## 2018-10-11 DIAGNOSIS — M6281 Muscle weakness (generalized): Secondary | ICD-10-CM | POA: Diagnosis present

## 2018-10-11 NOTE — Patient Instructions (Signed)
Access Code: [email or web address]  URL: [email or web address]  Date: 10/11/2018  Prepared by: Jari Favre   Exercises  Seated Hamstring Stretch - 3 reps - 1 sets - 30 sec hold - 1x daily - 7x weekly  Supine Heel Slide - 10 reps - 3 sets - 1x daily - 7x weekly  Supine Lower Trunk Rotation - 10 reps - 3 sets - 1x daily - 7x weekly

## 2018-10-11 NOTE — Therapy (Addendum)
[facility] [address], [place], [place], [address] Phone: [phone]   Fax:  [phone]  Physical Therapy Evaluation  Patient Details  Name: Deborah Wise MRN: [id number] Date of Birth: 29-Dec-1950 Referring Provider (PT): Isaac Bliss, Rayford Halsted, MD   Encounter Date: 10/11/2018  PT End of Session - 10/11/18 1359    Visit Number  1    Date for PT Re-Evaluation  12/06/18    PT Start Time  1350    PT Stop Time  1440    PT Time Calculation (min)  50 min    Activity Tolerance  Patient limited by fatigue;Patient limited by pain    Behavior During Therapy  [facility] for tasks assessed/performed       Past Medical History:  Diagnosis Date  . Arthritis   . Carotid artery occlusion   . Cirrhosis ([place])   . Diabetes mellitus without complication ([place])   . Fibroid, uterine   . GERD (gastroesophageal reflux disease)   . Hepatitis    hep C  . Hypertension   . Stroke ([place])   . Thrombocytopenia ([place]) 12/25/2014  . Urinary incontinence    Patient noticed mild and is not currently a significant problem    Past Surgical History:  Procedure Laterality Date  . ANKLE FRACTURE SURGERY Right 1999   x 2, rod  . IR GENERIC HISTORICAL  06/30/2016   IR ANGIO VERTEBRAL SEL SUBCLAVIAN INNOMINATE UNI R MOD SED 06/30/2016 [name], MD [facility]  . IR GENERIC HISTORICAL  06/30/2016   IR ANGIO VERTEBRAL SEL VERTEBRAL UNI L MOD SED 06/30/2016 [name], MD [facility]  . IR GENERIC HISTORICAL  06/30/2016   IR ANGIO INTRA EXTRACRAN SEL COM CAROTID INNOMINATE BILAT MOD SED 06/30/2016 [name], MD [facility]  . MULTIPLE EXTRACTIONS WITH ALVEOLOPLASTY  10/25/2011   Procedure: MULTIPLE EXTRACION WITH ALVEOLOPLASTY;  Surgeon: Gae Bon, DDS;  Location: [facility];  Service: Oral Surgery;  Laterality: Bilateral;  Extract teeth numbers one, two, six, seven, eight, nine, ten, eleven, twelve, fifteen, sixteen,  eighteen, nineteen, twenty-three, twenty-four, twenty-five, twenty-seven, thirty-two and alveoplasty.    There were no vitals filed for this visit.   Subjective Assessment - 10/11/18 1352    Subjective  Pain throughout the back of the legs for a while but two weeks ago it became worse for no reason that she knows of.    Limitations  Walking    How long can you sit comfortably?  unlimited    How long can you stand comfortably?  15 minutes    How long can you walk comfortably?  15-20 min    Patient Stated Goals  walk longer, was able to walk a few hours at a time    Currently in Pain?  Yes    Pain Score  1    8/10 worst   Pain Location  Back    Pain Orientation  Right;Left    Pain Descriptors / Indicators  Nagging    Pain Type  Chronic pain    Pain Radiating Towards  radiates down the leg    Pain Onset  More than a month ago    Pain Frequency  Intermittent    Aggravating Factors   standing and walking    Pain Relieving Factors  sitting, leaning on the buggy    Effect of Pain on Daily Activities  walking    Multiple Pain Sites  No  [facility] PT Assessment - 10/11/18 0001      Assessment   Medical Diagnosis  M54.31 (ICD-10-CM) - Right sided sciatica    Referring Provider (PT)  Isaac Bliss, Rayford Halsted, MD    Onset Date/Surgical Date  --   years    Prior Therapy  No      Precautions   Precautions  None      Restrictions   Weight Bearing Restrictions  No      Balance Screen   Has the patient fallen in the past 6 months  Yes    How many times?  5   feels like due to weakness   Has the patient had a decrease in activity level because of a fear of falling?   Yes    Is the patient reluctant to leave their home because of a fear of falling?   Yes      [place] residence    Living Arrangements  Alone      Prior Function   Level of Independence  Independent    Vocation  On disability      Cognition   Overall Cognitive Status   Within Functional Limits for tasks assessed      Observation/Other Assessments   Focus on Therapeutic Outcomes (FOTO)   70% limited      Posture/Postural Control   Posture/Postural Control  Postural limitations    Postural Limitations  Flexed trunk;Right pelvic obliquity;Increased lumbar lordosis      ROM / Strength   AROM / PROM / Strength  AROM;PROM;Strength      AROM   AROM Assessment Site  Lumbar    Lumbar Flexion  50%    Lumbar Extension  50%      PROM   Overall PROM Comments  bilat hip 50% limited flexion; 50% limited ER/IR      Strength   Strength Assessment Site  Knee;Ankle;Hip    Right/Left Hip  Right;Left    Right Hip Flexion  3+/5    Left Hip Flexion  3+/5    Right/Left Knee  Right;Left    Right Knee Flexion  3+/5    Right Knee Extension  3+/5    Left Knee Flexion  4-/5    Left Knee Extension  4-/5    Right/Left Ankle  Right;Left    Right Ankle Dorsiflexion  3+/5    Left Ankle Dorsiflexion  3+/5      Flexibility   Soft Tissue Assessment /Muscle Length  yes    Hamstrings  50% limited      Palpation   Palpation comment  very tender to palpation of lumbar extensors and pain radiated down LE with light palpation      Special Tests    Special Tests  Lumbar    Lumbar Tests  Straight Leg Raise      Straight Leg Raise   Comment  high pain was difficutly to get accurate results      Ambulation/Gait   Gait Pattern  Trunk flexed   leaning to the Rt when WB on Rt LE     Standardized Balance Assessment   Standardized Balance Assessment  Five Times Sit to Stand    Five times sit to stand comments   33 sec, hands to push off on chair                Objective measurements completed on examination: See above findings.      [facility]  Adult PT Treatment/Exercise - 10/11/18 0001      Self-Care   Self-Care  Other Self-Care Comments    Other Self-Care Comments    Access Code: [email or web address]              PT Education - 10/11/18 1803    Education Details    Access Code: [email or web address]     Person(s) Educated  Patient    Methods  Explanation;Demonstration;Verbal cues;Tactile cues;Handout    Comprehension  Verbalized understanding;Returned demonstration       PT Short Term Goals - 10/11/18 1454      PT SHORT TERM GOAL #1   Title  ind with initial HEP    Time  4    Period  Weeks    Status  New    [place] Date  11/08/18      PT SHORT TERM GOAL #2   Title  reports 25% less pain when walking in the store    Time  4    Period  Weeks    Status  New    [place] Date  11/08/18      PT SHORT TERM GOAL #3   Title  able to perform sit to stand 5x in <30 seconds for reduced risk of falls    Time  4    Period  Weeks    Status  New    [place] Date  11/08/18        PT Long Term Goals - 10/11/18 1455      PT LONG TERM GOAL #1   Title  Pt will be ind with advanced HEP    Time  8    Period  Weeks    Status  New    [place] Date  12/06/18      PT LONG TERM GOAL #2   Title  Pt will be able to walk at least 30 minutes without Rt LE buckling due to improved LE strength    Time  8    Period  Weeks    Status  New    [place] Date  12/06/18      PT LONG TERM GOAL #3   Title  Pt will report 50% less pain during typical daily activities    Time  8    Period  Weeks    Status  New    [place] Date  12/06/18      PT LONG TERM GOAL #4   Title  Pt will be able to put socks and shoes on and bathe with only a little difficulty    Time  8    Period  Weeks    Status  New    [place] Date  12/06/18             Plan - 10/11/18 1444    Clinical Impression Statement  Patient presents to clnic today due to low back pain.  She also reports falling due to legs buckling recently since March.  She has chronic pain but it has gotten worse since March.  Pt has LE weakness of 3+/5.  She has decreased lumbar ROM flexion and extension.  She performed 5x sit to stand in  33 sec and needed to use hands.  She had to sit down after 2 minutes of ROM in standing and  reports feeling tired.  Rt LE buckled slightly when standing.  Pt had positive SLR on Rt side.  Pt had muscle tension of lumbar extensors and can only tolerate mild  pressure due to increased pain in Rt LE with pressure on low back.  Pt exhibits signs of hypersensativity and chronic pain.  Pt has decreased flexibility as mentioned above and will benefit from skilled PT to address deficits so she can get safely get back to more activities and reduced risk of falls.    Personal Factors and Comorbidities  Age;Social Background;Fitness;Comorbidity 2    Comorbidities  chronic pain, on disability    Examination-Activity Limitations  Bathing;Dressing;Bed Mobility;Stand    Stability/Clinical Decision Making  Evolving/Moderate complexity    Clinical Decision Making  Moderate    Rehab Potential  Good    PT Frequency  2x / week    PT Duration  8 weeks    PT Treatment/Interventions  ADLs/Self Care Home Management;Biofeedback;Cryotherapy;Electrical Stimulation;Moist Heat;Traction;Functional mobility training;Therapeutic activities;Therapeutic exercise;Neuromuscular re-education;Patient/family education;Manual techniques;Dry needling;Passive range of motion;Taping    PT Next Visit Plan  f/u on HEP; gentle ROM, core strength    PT Home Exercise Plan   Access Code: [email or web address]     Consulted and Agree with Plan of Care  Patient       Patient will benefit from skilled therapeutic intervention in order to improve the following deficits and impairments:  Abnormal gait, Decreased balance, Difficulty walking, Increased muscle spasms, Pain, Postural dysfunction, Impaired flexibility, Increased fascial restricitons, Decreased strength, Decreased activity tolerance, Decreased range of motion  Visit Diagnosis: Chronic bilateral low back pain with right-sided sciatica - Plan: PT plan of care cert/re-cert  Muscle weakness (generalized) - Plan: PT plan of care cert/re-cert  Repeated falls - Plan: PT plan of care  cert/re-cert     Problem List Patient Active Problem List   Diagnosis Date Noted  . Cirrhosis of liver ([place]) 06/22/2018  . Chronic hepatitis ([place]) 06/22/2018  . Hyperlipidemia associated with type 2 diabetes mellitus ([place]) 06/22/2018  . Carotid artery disease ([place]) 06/22/2018  . 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[place]) 07/02/2016  . Aneurysm of middle cerebral artery   . Cerebrovascular accident (CVA) due to thrombosis of left vertebral artery ([place]) 06/28/2016  . Cocaine abuse ([place])   . Stroke (cerebrum) ([place]) 06/27/2016  . Microcytic anemia 12/27/2014  . Thrombocytopenia ([place]) 12/25/2014  . Hypoglycemia 07/21/2013  . Diabetes mellitus ([place]) 07/21/2013  . 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 Liver disease 07/21/2013  . Alcohol abuse 07/21/2013    Jule Ser, PT 10/11/2018, 6:05 PM  [facility] [address] [place], [place], [address] Phone: [phone]   Fax:  [phone]  Name: ONEAL BIGLOW MRN: [id number] Date of Birth: [birth year]

## 2018-10-18 ENCOUNTER — Ambulatory Visit: Payer: Medicare Other | Admitting: Physical Therapy

## 2018-10-23 ENCOUNTER — Other Ambulatory Visit: Payer: Self-pay

## 2018-10-25 ENCOUNTER — Encounter: Payer: Medicare Other | Admitting: Physical Therapy

## 2018-11-01 ENCOUNTER — Ambulatory Visit: Payer: Medicare Other | Admitting: Physical Therapy

## 2018-11-02 ENCOUNTER — Other Ambulatory Visit: Payer: Self-pay

## 2018-11-02 DIAGNOSIS — I6523 Occlusion and stenosis of bilateral carotid arteries: Secondary | ICD-10-CM

## 2018-11-03 ENCOUNTER — Telehealth (HOSPITAL_COMMUNITY): Payer: Self-pay | Admitting: Rehabilitation

## 2018-11-03 NOTE — Telephone Encounter (Signed)

## 2018-11-06 ENCOUNTER — Ambulatory Visit (INDEPENDENT_AMBULATORY_CARE_PROVIDER_SITE_OTHER): Payer: Medicare Other | Admitting: Family

## 2018-11-06 ENCOUNTER — Encounter: Payer: Self-pay | Admitting: Family

## 2018-11-06 ENCOUNTER — Ambulatory Visit (HOSPITAL_COMMUNITY)
Admission: RE | Admit: 2018-11-06 | Discharge: 2018-11-06 | Disposition: A | Discharge status: Home / Self Care | Payer: Medicare Other | Source: Ambulatory Visit | Attending: Family | Admitting: Family

## 2018-11-06 ENCOUNTER — Other Ambulatory Visit: Payer: Self-pay

## 2018-11-06 VITALS — BP 158/81 | HR 82 | Temp 98.0°F | Resp 14 | Ht 65.0 in | Wt 181.0 lb

## 2018-11-06 DIAGNOSIS — I6523 Occlusion and stenosis of bilateral carotid arteries: Secondary | ICD-10-CM

## 2018-11-06 DIAGNOSIS — Z8673 Personal history of transient ischemic attack (TIA), and cerebral infarction without residual deficits: Secondary | ICD-10-CM

## 2018-11-06 NOTE — Patient Instructions (Signed)

## 2018-11-06 NOTE — Progress Notes (Signed)
Chief Complaint: Follow up Extracranial Carotid Artery Stenosis   History of Present Illness  Deborah Wise is a 68 y.o. female who initially presented to the hospital in January 2018 with slurred speech and right-sided weakness.  Her UDS was positive for cocaine. She has a history of hep C and cirrhosis. Her MRI was consistent with a left brain stroke. Her CT angiogram revealed a tight right carotid artery stenosis. She went on to have a cerebral angiogram which revealed the stenosis in the right carotid artery was 60-65 percent. The patient was ultimately discharged the hospital.She also had intracranial 48m aneurysms which are being managed medically.  Dr. BTrula Sladelast evaluated pt on 04-25-17. At that timecarotid duplexdemonstrated astable 60-79% right carotid stenosisand1-39% left carotid stenosis Asymptomatic right carotid stenosis:Dr. Brabhamdiscussed with ptthe signs and symptoms of stroke.She understands that we recommend intervention for symptomatic disease or if the stenosis becomes greater than 80%.Dr. BBlair Promisethat her carotid stenosis on the right is not responsible for her left brain stroke. She was tofollow-up in 6 months with a repeat carotid duplex.  She reports that other than slurred speech and some right-sided weakness, her symptoms have remained stable. She has recently had recurrent falls, she is receiving physical therapy for sciatica.She states she feels like she lists to the right at times when walking. She states that she trouble getting in and out of her tub.  She states that she is trying to get a caregiver, states she has discussed this with her PCP.   ETOH use: she admits to a couple cans of beer and "a little drink" daily.  She states that she has trouble catching her breath, is vague in this description, some of her speech is unintelligible. She denies chest pain, states she has headaches at times but not now, denies  feeling light headed.   Diabetic:yes, last A1C result on file was 5.8 on 08-02-18, in excellent control Tobacco use:non-smoker  Pt meds include: Statin :no ASA:no Other anticoagulants/antiplatelets:no   Past Medical History:  Diagnosis Date  . Arthritis   . Carotid artery occlusion   . Cirrhosis ([facility]   . Diabetes mellitus without complication ([facility] [place]   . Fibroid, uterine   . GERD (gastroesophageal reflux disease)   . Hepatitis    hep C  . Hypertension   . [place]   . Thrombocytopenia ([facility] [place] 12/25/2014  . Urinary incontinence    Patient noticed mild and is not currently a significant problem    Social History Social History   Tobacco Use  . Smoking status: Never Smoker  . Smokeless tobacco: Never Used  Substance Use Topics  . Alcohol use: Yes    Alcohol/week: 1.0 standard drinks    Types: 1 Cans of beer per week    Comment: 1/2 bottle of wine and some beer a day  . Drug use: Not Currently    Frequency: 1.0 times per week    Types: Cocaine    Comment: last use cocaine last year 2018    Family History Family History  Problem Relation Age of Onset  . Diabetes type II Mother   . Hypertension Mother   . Stroke Mother   . Cancer Father        patient does not know what type of cancer  . Diabetes Daughter   . Breast cancer Neg Hx   . Colon polyps Neg Hx   . Crohn's disease Neg Hx   . Esophageal cancer Neg Hx   . Rectal  cancer Neg Hx   . Stomach cancer Neg Hx     Surgical History Past Surgical History:  Procedure Laterality Date  . ANKLE FRACTURE SURGERY Right 1999   x 2, rod  . IR GENERIC HISTORICAL  06/30/2016   IR ANGIO VERTEBRAL SEL SUBCLAVIAN INNOMINATE UNI R MOD SED 06/30/2016 [name], MD [facility]  . IR GENERIC HISTORICAL  06/30/2016   IR ANGIO VERTEBRAL SEL VERTEBRAL UNI L MOD SED 06/30/2016 [name], MD [facility]  . IR GENERIC HISTORICAL  06/30/2016   IR ANGIO INTRA EXTRACRAN SEL COM CAROTID INNOMINATE BILAT MOD SED  06/30/2016 [name], MD [facility]  . MULTIPLE EXTRACTIONS WITH ALVEOLOPLASTY  10/25/2011   Procedure: MULTIPLE EXTRACION WITH ALVEOLOPLASTY;  Surgeon: Gae Bon, DDS;  Location: [facility];  Service: Oral Surgery;  Laterality: Bilateral;  Extract teeth numbers one, two, six, seven, eight, nine, ten, eleven, twelve, fifteen, sixteen, eighteen, nineteen, twenty-three, twenty-four, twenty-five, twenty-seven, thirty-two and alveoplasty.    No Known Allergies  Current Outpatient Medications  Medication Sig Dispense Refill  . ACCU-CHEK SOFTCLIX LANCETS lancets Accu-Chek Softclix Lancets  USE AS DIRECTED TO CHECK BLOOD SUGAR TWICE A DAY    . amLODipine (NORVASC) 10 MG tablet Take 1 tablet (10 mg total) by mouth daily. 90 tablet 1  . aspirin EC 81 MG tablet Take 1 tablet (81 mg total) by mouth daily. 31 tablet 11  . atorvastatin (LIPITOR) 80 MG tablet Take 1 tablet (80 mg total) by mouth daily. 90 tablet 3  . Blood Glucose Monitoring Suppl (ACCU-CHEK GUIDE) w/Device KIT Please specify directions, refills and quantity 1 kit 0  . cyclobenzaprine (FLEXERIL) 5 MG tablet Take 1 tablet (5 mg total) by mouth 2 (two) times daily as needed for muscle spasms. 30 tablet 1  . diclofenac (VOLTAREN) 50 MG EC tablet Take 1 tablet (50 mg total) by mouth 2 (two) times daily. 20 tablet 0  . Evolocumab (REPATHA SURECLICK) 701 MG/ML SOAJ Inject 140 mg into the skin every 14 (fourteen) days. 2 pen 3  . glucose blood test strip 1 each by Other route daily. Dx E11.9 E11.69 100 each 3  . lisinopril (PRINIVIL,ZESTRIL) 40 MG tablet Take 1 tablet (40 mg total) by mouth daily. 90 tablet 3  . meloxicam (MOBIC) 7.5 MG tablet Take 1 tablet (7.5 mg total) by mouth daily. 30 tablet 1  . metFORMIN (GLUCOPHAGE) 1000 MG tablet Take 500 mg by mouth daily.   2  . HYDROcodone-acetaminophen (NORCO/VICODIN) 5-325 MG tablet Take 1 tablet by mouth every 4 (four) hours as needed. One tab q4h prn pain (Patient not taking: Reported on  11/06/2018) 15 tablet 0  . predniSONE (STERAPRED UNI-PAK 21 TAB) 10 MG (21) TBPK tablet Take as directed (Patient not taking: Reported on 11/06/2018) 21 tablet 0   Current Facility-Administered Medications  Medication Dose Route Frequency Provider Last Rate Last Dose  . 0.9 %  sodium chloride infusion  500 mL Intravenous Once Irene Shipper, MD        Review of Systems : See HPI for pertinent positives and negatives.  Physical Examination  Vitals:   11/06/18 1103 11/06/18 1105  BP: (!) 168/78 (!) 158/81  Pulse: 82 82  Resp: 14   Temp: 98 F (36.7 C)   TempSrc: Temporal   SpO2: 98%   Weight: 181 lb (82.1 kg)   Height: _0  (1.651 m)    Body mass index is 30.12 kg/m.  General: WDWN obese female in NAD GAIT:  mildly ataxic Eyes: PERRLA HENT: No gross abnormalities.  Pulmonary:  Respirations are non-labored, little air movement in right posterior lung fields, adequate in left posterior fields, good in bilateral anterior lung fields, no rales, rhonchi, or wheezing. Cardiac: regular rhythm, no detected murmur.  VASCULAR EXAM Carotid Bruits Right Left   Positive Negative     Abdominal aortic pulse is not palpable. Radial pulses are 2+ palpable and equal.                                                                                                                            LE Pulses Right Left       POPLITEAL  not palpable   not palpable       POSTERIOR TIBIAL  not palpable   not palpable        DORSALIS PEDIS      ANTERIOR TIBIAL not palpable  not palpable     Gastrointestinal: soft, nontender, BS WNL, no r/g, no palpable masses. Musculoskeletal: no muscle atrophy/wasting. M/S 5/5 in left upper extremity, 3/5 in right UE, and 3/5 in bilateral LE's, extremities without ischemic changes Skin: No rashes, no ulcers, no cellulitis.   Neurologic:  A&O X 3; appropriate affect, sensation is normal; speech is somewhat slurred (exaggerated by wearing a mask over her nose and  mouth), CN 2-12 intact, pain and light touch intact in extremities, motor exam as listed above. Psychiatric: Normal thought content, mood appropriate to clinical situation.    DATA Carotid Duplex (11-06-18): Right Carotid: Velocities in the right ICA are consistent with a 40-59%                stenosis. Left Carotid: Velocities in the left ICA are consistent with a 1-39% stenosis.               Non-hemodynamically significant plaque noted in the CCA. Vertebrals:  Bilateral vertebral arteries demonstrate antegrade flow. Subclavians: Normal flow hemodynamics were seen in bilateral subclavian              arteries. Appears to be less stenotic in the right ICA compared to the exam on 05-26-18, stable in the left ICA.   Assessment: Deborah Wise is a 68 y.o. female who presented to the hospital inJanuary 2018 with slurred speech and right-sided weakness.  Her MRI was consistent with a left brain stroke. Her CT angiogram revealed a tight right carotid artery stenosis. She went on to have a cerebral angiogram which revealed the stenosis in the right carotid artery was 60-65 percent.  She had some recurrent right side weakness and falling from this about November 2019, along with slurred speech. She also admits to daily ETOH use. Has had + UDS for cocaine at ED visit in January 2018.  Her blood pressure is in better control today than at previous visits, still uncontrolled.   She is receiving treatment and PT for bilateral sciatica, states she has received spinal injections for this,  she states no benefit from the injections.   She reports dyspnea for the last 2 months, right posterior lung fields with little air movement. I advised pt to call her PCP's office today and make an appointment re this. No rales, rhonchi, or wheezing.    Plan: Follow-up in 6 months with Carotid Duplex scan.   I discussed in depth with the patient the nature of atherosclerosis, and emphasized the  importance of maximal medical management including strict control of blood pressure, blood glucose, and lipid levels, obtaining regular exercise, and continued cessation of smoking.  The patient is aware that without maximal medical management the underlying atherosclerotic disease process will progress, limiting the benefit of any interventions. The patient was given information about stroke prevention and what symptoms should prompt the patient to seek immediate medical care. Thank you for allowing [place] to participate in this patient's care.  Clemon Chambers, RN, MSN, FNP-C [facility] Office: [facility] Physician: Trula Slade  11/06/18 11:28 AM

## 2018-11-09 ENCOUNTER — Encounter: Payer: Self-pay | Admitting: Internal Medicine

## 2018-11-13 ENCOUNTER — Encounter: Payer: Self-pay | Admitting: Neurology

## 2018-11-13 ENCOUNTER — Ambulatory Visit (INDEPENDENT_AMBULATORY_CARE_PROVIDER_SITE_OTHER): Payer: Medicare Other | Admitting: Neurology

## 2018-11-13 ENCOUNTER — Other Ambulatory Visit: Payer: Self-pay

## 2018-11-13 DIAGNOSIS — M5431 Sciatica, right side: Secondary | ICD-10-CM

## 2018-11-13 DIAGNOSIS — R202 Paresthesia of skin: Secondary | ICD-10-CM | POA: Diagnosis not present

## 2018-11-13 DIAGNOSIS — R200 Anesthesia of skin: Secondary | ICD-10-CM

## 2018-11-13 NOTE — Procedures (Signed)
     HISTORY:  Deborah Wise is a 68 year old patient with a history of back pain and sciatica pain down the right leg to the foot.  She feels that the right leg is slightly weak.  The pain has improved following some injections, but she still has some discomfort.  She is being evaluated for possible neuropathy or a lumbosacral radiculopathy.  NERVE CONDUCTION STUDIES:  Nerve conduction studies were performed on both lower extremities.  The distal motor latencies for the peroneal and posterior tibial nerves were normal bilaterally with a low motor amplitude of the right peroneal nerve, normal for the left peroneal nerve and for the posterior tibial nerves bilaterally.  Slowing was seen above and below the fibular head for the right peroneal nerve, normal for the left peroneal nerve and for the posterior tibial nerves bilaterally.  The peroneal and sural sensory latencies were normal bilaterally.  The F-wave latencies for the posterior tibial nerves were normal bilaterally.  EMG STUDIES:  EMG study was performed on the right lower extremity:  The tibialis anterior muscle reveals 2 to 5K motor units with decreased recruitment. No fibrillations or positive waves were seen. The peroneus tertius muscle reveals 2 to 4K motor units with decreased recruitment. No fibrillations or positive waves were seen. The medial gastrocnemius muscle reveals 1 to 3K motor units with full recruitment. No fibrillations or positive waves were seen. The vastus lateralis muscle reveals 2 to 4K motor units with full recruitment. No fibrillations or positive waves were seen. The iliopsoas muscle reveals 2 to 4K motor units with full recruitment. No fibrillations or positive waves were seen. The biceps femoris muscle (long head) reveals 2 to 4K motor units with full recruitment. No fibrillations or positive waves were seen. The lumbosacral paraspinal muscles were tested at 3 levels, and revealed no abnormalities of  insertional activity at all 3 levels tested. There was good relaxation.   IMPRESSION:  Nerve conduction studies done on both lower extremity shows some primarily motor dysfunction of the right peroneal nerve at or slightly above the fibular head.  There is no evidence of a generalized peripheral neuropathy.  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  Jill Alexanders MD 11/13/2018 11:24 AM  [facility] [address] [place], [place] [address]  Phone [phone] Fax [phone]

## 2018-11-13 NOTE — Progress Notes (Signed)
Please refer to EMG and nerve conduction procedure note.  

## 2018-11-15 ENCOUNTER — Telehealth: Payer: Self-pay | Admitting: *Deleted

## 2018-11-15 NOTE — Telephone Encounter (Signed)
Patient is aware and verbally agreed to go to Urgent Care

## 2018-11-15 NOTE — Telephone Encounter (Signed)
I believe she needs to be evaluated in person. Due to COVID restrictions and lack of adequate PPE in office, I believe she should be seen today at UC due to SOB and lack of air movement on lung auscultation per vascular surgeon.

## 2018-11-15 NOTE — Telephone Encounter (Signed)
Patient calling for an in office visit due to SOB.  Patient states that she went to her vein and vascular doctor and when they listen to her lungs there "wasn't enough air passing".  Office visit note from 11/06/2018: She reports dyspnea for the last 2 months, right posterior lung fields with little air movement. I advised pt to call her PCP's office today and make an appointment re this. No rales, rhonchi, or wheezing.   Patient denies any new cough, sneezing, HA, GI upset, body aches, rash, bleeding or bruising.  Patient has not travelled in the last few months.  Patient does complain of " some SOB".  Please advise

## 2018-11-16 NOTE — Progress Notes (Signed)
[facility]    Nerve / Sites Muscle Latency Ref. Amplitude Ref. Rel Amp Segments Distance Velocity Ref. Area    ms ms mV mV %  cm m/s m/s mVms  R Peroneal - EDB     Ankle EDB 4.8 ?6.5 0.1 ?2.0 100 Ankle - EDB 9   0.4     Fib head EDB 13.5  0.2  153 Fib head - Ankle 28 32 ?44 0.2     Pop fossa EDB 16.8  0.2  115 Pop fossa - Fib head 10 30 ?44 0.4         Pop fossa - Ankle      L Peroneal - EDB     Ankle EDB 3.6 ?6.5 6.6 ?2.0 100 Ankle - EDB 9   13.9     Fib head EDB 9.5  5.9  89.2 Fib head - Ankle 28 48 ?44 13.0     Pop fossa EDB 11.2  5.3  88.8 Pop fossa - Fib head 8 47 ?44 11.8         Pop fossa - Ankle      R Tibial - AH     Ankle AH 3.7 ?5.8 12.9 ?4.0 100 Ankle - AH 9   26.1     Pop fossa AH 11.1  9.1  70.3 Pop fossa - Ankle 34 46 ?41 22.2  L Tibial - AH     Ankle AH 3.8 ?5.8 14.6 ?4.0 100 Ankle - AH 9   30.7     Pop fossa AH 11.4  12.8  87.8 Pop fossa - Ankle 33 43 ?41 29.0             SNC    Nerve / Sites Rec. Site Peak Lat Ref.  Amp Ref. Segments Distance    ms ms V V  cm  R Sural - Ankle (Calf)     Calf Ankle 3.2 ?4.4 7 ?6 Calf - Ankle 14  L Sural - Ankle (Calf)     Calf Ankle 2.9 ?4.4 6 ?6 Calf - Ankle 14  R Superficial peroneal - Ankle     Lat leg Ankle 4.1 ?4.4 3 ?6 Lat leg - Ankle 14  L Superficial peroneal - Ankle     Lat leg Ankle 3.8 ?4.4 6 ?6 Lat leg - Ankle 14              F  Wave    Nerve F Lat Ref.   ms ms  R Tibial - AH 44.3 ?56.0  L Tibial - AH 46.5 ?56.0

## 2018-11-22 ENCOUNTER — Ambulatory Visit: Payer: Medicare Other | Admitting: *Deleted

## 2018-11-22 ENCOUNTER — Other Ambulatory Visit: Payer: Self-pay

## 2018-11-22 VITALS — Ht 65.0 in | Wt 185.0 lb

## 2018-11-22 DIAGNOSIS — Z8719 Personal history of other diseases of the digestive system: Secondary | ICD-10-CM

## 2018-11-22 NOTE — Progress Notes (Signed)

## 2018-11-24 ENCOUNTER — Telehealth: Payer: Self-pay | Admitting: Internal Medicine

## 2018-11-24 NOTE — Telephone Encounter (Signed)

## 2018-11-27 ENCOUNTER — Encounter: Payer: Self-pay | Admitting: Internal Medicine

## 2018-11-27 ENCOUNTER — Ambulatory Visit (AMBULATORY_SURGERY_CENTER): Payer: Medicare Other | Admitting: Internal Medicine

## 2018-11-27 ENCOUNTER — Other Ambulatory Visit: Payer: Self-pay

## 2018-11-27 VITALS — BP 161/80 | HR 81 | Temp 98.3°F | Resp 19 | Ht 65.0 in | Wt 185.0 lb

## 2018-11-27 DIAGNOSIS — K7469 Other cirrhosis of liver: Secondary | ICD-10-CM

## 2018-11-27 DIAGNOSIS — Z8719 Personal history of other diseases of the digestive system: Secondary | ICD-10-CM

## 2018-11-27 DIAGNOSIS — I8500 Esophageal varices without bleeding: Secondary | ICD-10-CM

## 2018-11-27 DIAGNOSIS — K317 Polyp of stomach and duodenum: Secondary | ICD-10-CM | POA: Diagnosis not present

## 2018-11-27 DIAGNOSIS — D3A092 Benign carcinoid tumor of the stomach: Secondary | ICD-10-CM

## 2018-11-27 HISTORY — PX: ESOPHAGOGASTRODUODENOSCOPY: SHX1529

## 2018-11-27 MED ORDER — SODIUM CHLORIDE 0.9 % IV SOLN: 500.0000 mL | Freq: Once | INTRAVENOUS | Status: DC

## 2018-11-27 NOTE — Progress Notes (Signed)
Report to PACU, RN, vss, BBS= Clear.  

## 2018-11-27 NOTE — Op Note (Signed)
[facility] Patient Name: Deborah Wise Procedure Date: 11/27/2018 8:52 AM MRN: [id number] Endoscopist: Docia Chuck. Henrene Pastor , MD Age: 68 Referring MD:  Date of Birth: 05/02/1951 Gender: Female Account #: [id number] Procedure:                Upper GI endoscopy with biopsies; with snare                            polypectomy x 1 Indications:              Surveillance procedure (history of small gastric                            carcinoid), Follow-up of esophageal varices. Last                            examination May 2019. Now for follow-up Medicines:                Monitored Anesthesia Care Procedure:                Pre-Anesthesia Assessment:                           - Prior to the procedure, a History and Physical                            was performed, and patient medications and                            allergies were reviewed. The patient's tolerance of                            previous anesthesia was also reviewed. The risks                            and benefits of the procedure and the sedation                            options and risks were discussed with the patient.                            All questions were answered, and informed consent                            was obtained. Prior Anticoagulants: The patient has                            taken no previous anticoagulant or antiplatelet                            agents. ASA Grade Assessment: III - A patient with                            severe systemic disease. After reviewing the risks  and benefits, the patient was deemed in                            satisfactory condition to undergo the procedure.                           After obtaining informed consent, the endoscope was                            passed under direct vision. Throughout the                            procedure, the patient's blood pressure, pulse, and                            oxygen saturations  were monitored continuously. The                            Endoscope was introduced through the mouth, and                            advanced to the second part of duodenum. The upper                            GI endoscopy was accomplished without difficulty.                            The patient tolerated the procedure well. Scope In: Scope Out: Findings:                 The esophagus revealed diminutive distal varices.                           The stomach revealed a 5 mm hyperplastic appearing                            antral polyp and a 6 mm adenomatous appearing                            distal body polyp. Biopsies were taken with a cold                            forceps for histology. The adenomatous appearing                            polyp was felt to be entirely removed. Oozing from                            each site was treated with snare tip cautery.                            Finally, the proximal gastric body revealed a 5 to  6 mm polyp at the site of previous carcinoid. This                            was removed with a hot snare. Stomach was otherwise                            normal.                           The examined duodenum was normal.                           The cardia and gastric fundus were normal on                            retroflexion. Complications:            No immediate complications. Estimated Blood Loss:     Estimated blood loss: none. Impression:               1. Diminutive esophageal varices                           2. Multiple gastric polyps as described with both                            biopsies and hot snare polypectomy                           3. Otherwise normal exam. Recommendation:           1. Resume previous diet                           2. Continue current medications                           3. Follow-up pathology                           4. Repeat EGD in 1 year.                            5. Resume general medical care with your primary                            provider Docia Chuck. Henrene Pastor, MD 11/27/2018 9:33:44 AM This report has been signed electronically.

## 2018-11-27 NOTE — Patient Instructions (Signed)
YOU HAD AN ENDOSCOPIC PROCEDURE TODAY AT [facility]:   Refer to the procedure report that was given to you for any specific questions about what was found during the examination.  If the procedure report does not answer your questions, please call your gastroenterologist to clarify.  If you requested that your care partner not be given the details of your procedure findings, then the procedure report has been included in a sealed envelope for you to review at your convenience later.  YOU SHOULD EXPECT: Some feelings of bloating in the abdomen. Passage of more gas than usual.  Walking can help get rid of the air that was put into your GI tract during the procedure and reduce the bloating. If you had a lower endoscopy (such as a colonoscopy or flexible sigmoidoscopy) you may notice spotting of blood in your stool or on the toilet paper. If you underwent a bowel prep for your procedure, you may not have a normal bowel movement for a few days.  Please Note:  You might notice some irritation and congestion in your nose or some drainage.  This is from the oxygen used during your procedure.  There is no need for concern and it should clear up in a day or so.  SYMPTOMS TO REPORT IMMEDIATELY:   Following upper endoscopy (EGD)  Vomiting of blood or coffee ground material  New chest pain or pain under the shoulder blades  Painful or persistently difficult swallowing  New shortness of breath  Fever of 100F or higher  Black, tarry-looking stools  For urgent or emergent issues, a gastroenterologist can be reached at any hour by calling [phone].   DIET:  We do recommend a small meal at first, but then you may proceed to your regular diet.  Drink plenty of fluids but you should avoid alcoholic beverages for 24 hours.  ACTIVITY:  You should plan to take it easy for the rest of today and you should NOT DRIVE or use heavy machinery until tomorrow (because of the sedation medicines used  during the test).    FOLLOW UP: Our staff will call the number listed on your records 48-72 hours following your procedure to check on you and address any questions or concerns that you may have regarding the information given to you following your procedure. If we do not reach you, we will leave a message.  We will attempt to reach you two times.  During this call, we will ask if you have developed any symptoms of COVID 19. If you develop any symptoms (ie: fever, flu-like symptoms, shortness of breath, cough etc.) before then, please call [phone].  If you test positive for Covid 19 in the 2 weeks post procedure, please call and report this information to [place].    If any biopsies were taken you will be contacted by phone or by letter within the next 1-3 weeks.  Please call us at [phone] if you have not heard about the biopsies in 3 weeks.   Await for biopsy results Repeat EGD in one year Resume  General medical care with your primary provider  SIGNATURES/CONFIDENTIALITY: You and/or your care partner have signed paperwork which will be entered into your electronic medical record.  These signatures attest to the fact that that the information above on your After Visit Summary has been reviewed and is understood.  Full responsibility of the confidentiality of this discharge information lies with you and/or your care-partner.

## 2018-11-29 ENCOUNTER — Telehealth: Payer: Self-pay

## 2018-11-29 NOTE — Telephone Encounter (Signed)
  Follow up Call-  Call back number 11/27/2018 10/19/2017  Post procedure Call Back phone  # [phone]  Permission to leave phone message Yes Yes  Some recent data might be hidden     Patient questions:  Do you have a fever, pain , or abdominal swelling? No. Pain Score  0 *  Have you tolerated food without any problems? Yes.    Have you been able to return to your normal activities? Yes.    Do you have any questions about your discharge instructions: Diet   No. Medications  No. Follow up visit  No.  Do you have questions or concerns about your Care? No.  Actions: * If pain score is 4 or above: 1. No action needed, pain <4.Have you developed a fever since your procedure? no  2.   Have you had an respiratory symptoms (SOB or cough) since your procedure? no  3.   Have you tested positive for COVID 19 since your procedure no  4.   Have you had any family members/close contacts diagnosed with the COVID 19 since your procedure?  no   If yes to any of these questions please route to Joylene John, RN and Alphonsa Gin, Therapist, sports.

## 2018-11-30 ENCOUNTER — Encounter: Payer: Self-pay | Admitting: Internal Medicine

## 2018-12-02 ENCOUNTER — Other Ambulatory Visit: Payer: Self-pay | Admitting: Neurology

## 2018-12-06 ENCOUNTER — Telehealth: Payer: Self-pay

## 2018-12-06 NOTE — Telephone Encounter (Signed)
Pt is calling in to check the status of refill request

## 2018-12-06 NOTE — Telephone Encounter (Signed)
If patient calls back she needs to schedule an appt with Dr.Sethi for follow up. Pt was last seen 07/2018.     VM was left for patient to call back to r/s follow up appt with DR.Sethi.

## 2018-12-06 NOTE — Telephone Encounter (Signed)
Left vm for patient that refill or repatha rx was sent to her pharmacy. ALso left vm for patient that appt needs to be schedule with Dr.Sethi for follow up.

## 2018-12-08 DIAGNOSIS — M546 Pain in thoracic spine: Secondary | ICD-10-CM | POA: Diagnosis not present

## 2018-12-10 ENCOUNTER — Emergency Department (HOSPITAL_COMMUNITY)
Admission: EM | Admit: 2018-12-10 | Discharge: 2018-12-10 | Disposition: A | Discharge status: Home / Self Care | Payer: Medicare Other | Attending: Emergency Medicine | Admitting: Emergency Medicine

## 2018-12-10 ENCOUNTER — Other Ambulatory Visit: Payer: Self-pay

## 2018-12-10 ENCOUNTER — Emergency Department (HOSPITAL_COMMUNITY): Payer: Medicare Other

## 2018-12-10 ENCOUNTER — Encounter (HOSPITAL_COMMUNITY): Payer: Self-pay | Admitting: Emergency Medicine

## 2018-12-10 DIAGNOSIS — I10 Essential (primary) hypertension: Secondary | ICD-10-CM | POA: Diagnosis not present

## 2018-12-10 DIAGNOSIS — R071 Chest pain on breathing: Secondary | ICD-10-CM | POA: Insufficient documentation

## 2018-12-10 DIAGNOSIS — W108XXA Fall (on) (from) other stairs and steps, initial encounter: Secondary | ICD-10-CM | POA: Insufficient documentation

## 2018-12-10 DIAGNOSIS — R0602 Shortness of breath: Secondary | ICD-10-CM | POA: Diagnosis not present

## 2018-12-10 DIAGNOSIS — R0789 Other chest pain: Secondary | ICD-10-CM | POA: Diagnosis not present

## 2018-12-10 DIAGNOSIS — Y9301 Activity, walking, marching and hiking: Secondary | ICD-10-CM | POA: Diagnosis not present

## 2018-12-10 DIAGNOSIS — D259 Leiomyoma of uterus, unspecified: Secondary | ICD-10-CM | POA: Diagnosis not present

## 2018-12-10 DIAGNOSIS — Y998 Other external cause status: Secondary | ICD-10-CM | POA: Diagnosis not present

## 2018-12-10 DIAGNOSIS — Z79899 Other long term (current) drug therapy: Secondary | ICD-10-CM | POA: Insufficient documentation

## 2018-12-10 DIAGNOSIS — S0003XA Contusion of scalp, initial encounter: Secondary | ICD-10-CM | POA: Diagnosis not present

## 2018-12-10 DIAGNOSIS — Z8673 Personal history of transient ischemic attack (TIA), and cerebral infarction without residual deficits: Secondary | ICD-10-CM | POA: Diagnosis not present

## 2018-12-10 DIAGNOSIS — Q2579 Other congenital malformations of pulmonary artery: Secondary | ICD-10-CM | POA: Insufficient documentation

## 2018-12-10 DIAGNOSIS — S2232XA Fracture of one rib, left side, initial encounter for closed fracture: Secondary | ICD-10-CM | POA: Diagnosis not present

## 2018-12-10 DIAGNOSIS — Z7984 Long term (current) use of oral hypoglycemic drugs: Secondary | ICD-10-CM | POA: Insufficient documentation

## 2018-12-10 DIAGNOSIS — Z7982 Long term (current) use of aspirin: Secondary | ICD-10-CM | POA: Insufficient documentation

## 2018-12-10 DIAGNOSIS — Y92018 Other place in single-family (private) house as the place of occurrence of the external cause: Secondary | ICD-10-CM | POA: Insufficient documentation

## 2018-12-10 DIAGNOSIS — S3993XA Unspecified injury of pelvis, initial encounter: Secondary | ICD-10-CM | POA: Diagnosis not present

## 2018-12-10 DIAGNOSIS — I2510 Atherosclerotic heart disease of native coronary artery without angina pectoris: Secondary | ICD-10-CM | POA: Insufficient documentation

## 2018-12-10 DIAGNOSIS — R079 Chest pain, unspecified: Secondary | ICD-10-CM | POA: Diagnosis not present

## 2018-12-10 DIAGNOSIS — E119 Type 2 diabetes mellitus without complications: Secondary | ICD-10-CM | POA: Insufficient documentation

## 2018-12-10 DIAGNOSIS — R51 Headache: Secondary | ICD-10-CM | POA: Diagnosis not present

## 2018-12-10 DIAGNOSIS — S299XXA Unspecified injury of thorax, initial encounter: Secondary | ICD-10-CM | POA: Diagnosis present

## 2018-12-10 DIAGNOSIS — S3991XA Unspecified injury of abdomen, initial encounter: Secondary | ICD-10-CM | POA: Diagnosis not present

## 2018-12-10 DIAGNOSIS — S199XXA Unspecified injury of neck, initial encounter: Secondary | ICD-10-CM | POA: Diagnosis not present

## 2018-12-10 DIAGNOSIS — I289 Disease of pulmonary vessels, unspecified: Secondary | ICD-10-CM | POA: Diagnosis not present

## 2018-12-10 LAB — BASIC METABOLIC PANEL
Anion gap: 10 (ref 5–15)
BUN: 23 mg/dL (ref 8–23)
CO2: 25 mmol/L (ref 22–32)
Calcium: 10.3 mg/dL (ref 8.9–10.3)
Chloride: 106 mmol/L (ref 98–111)
Creatinine, Ser: 1.05 mg/dL — ABNORMAL HIGH (ref 0.44–1.00)
GFR calc Af Amer: >60 mL/min (ref >60)
GFR calc non Af Amer: 55 mL/min — ABNORMAL LOW (ref >60)
Glucose, Bld: 126 mg/dL — ABNORMAL HIGH (ref 70–99)
Potassium: 4.2 mmol/L (ref 3.5–5.1)
Sodium: 141 mmol/L (ref 135–145)

## 2018-12-10 LAB — CBC WITH DIFFERENTIAL/PLATELET
Abs Immature Granulocytes: 0.03 10*3/uL (ref 0.00–0.07)
Basophils Absolute: 0 10*3/uL (ref 0.0–0.1)
Basophils Relative: 0 %
Eosinophils Absolute: 0 10*3/uL (ref 0.0–0.5)
Eosinophils Relative: 0 %
HCT: 39.6 % (ref 36.0–46.0)
Hemoglobin: 12.3 g/dL (ref 12.0–15.0)
Immature Granulocytes: 0 %
Lymphocytes Relative: 36 %
Lymphs Abs: 3.1 10*3/uL (ref 0.7–4.0)
MCH: 24 pg — ABNORMAL LOW (ref 26.0–34.0)
MCHC: 31.1 g/dL (ref 30.0–36.0)
MCV: 77.2 fL — ABNORMAL LOW (ref 80.0–100.0)
Monocytes Absolute: 0.8 10*3/uL (ref 0.1–1.0)
Monocytes Relative: 9 %
Neutro Abs: 4.7 10*3/uL (ref 1.7–7.7)
Neutrophils Relative %: 55 %
Platelets: 142 10*3/uL — ABNORMAL LOW (ref 150–400)
RBC: 5.13 MIL/uL — ABNORMAL HIGH (ref 3.87–5.11)
RDW: 15.6 % — ABNORMAL HIGH (ref 11.5–15.5)
WBC: 8.6 10*3/uL (ref 4.0–10.5)
nRBC: 0.2 % (ref 0.0–0.2)

## 2018-12-10 MED ORDER — LIDOCAINE 5 % EX PTCH
1.0000 | MEDICATED_PATCH | CUTANEOUS | Status: DC
  Administered 2018-12-10: 1 via TRANSDERMAL
  Filled 2018-12-10: qty 1

## 2018-12-10 MED ORDER — OXYCODONE HCL 5 MG PO TABS: 2.5000 mg | ORAL_TABLET | Freq: Four times a day (QID) | ORAL | 0 refills | Status: DC | PRN

## 2018-12-10 MED ORDER — HYDROCODONE-ACETAMINOPHEN 5-325 MG PO TABS
1.0000 | ORAL_TABLET | Freq: Once | ORAL | Status: AC
  Administered 2018-12-10: 1 via ORAL
  Filled 2018-12-10: qty 1

## 2018-12-10 MED ORDER — IOHEXOL 300 MG/ML  SOLN
100.0000 mL | Freq: Once | INTRAMUSCULAR | Status: AC | PRN
  Administered 2018-12-10: 100 mL via INTRAVENOUS

## 2018-12-10 NOTE — ED Notes (Signed)
Updated niece, Nelva Nay [phone]

## 2018-12-10 NOTE — Discharge Instructions (Signed)
Please see the information and instructions below regarding your visit.  Your diagnoses today include:  1. Closed fracture of one rib of left side, initial encounter   2. Uterine leiomyoma, unspecified location   3. Pulmonary artery abnormality     Tests performed today include: See side panel of your discharge paperwork for testing performed today. Vital signs are listed at the bottom of these instructions.   Your CT scan is showing a possible rib fracture of the left ninth rib.  We do not see any other fractures.  You do have a large fibroid in your uterus.  This is a noncancerous muscle growth.  You can follow with her OB/GYN regarding this.  You also has some enlargement of the arteries in your lungs and your CT scan.  You may need an ultrasound as an outpatient with your primary care provider.  Please follow-up with Dr. Deniece Ree.   Medications prescribed:    Take any prescribed medications only as prescribed, and any over the counter medications only as directed on the packaging.  You have been prescribed Oxycodone for pain.  You may take a half a tablet every 4-6 hours.  This is an opioid pain medication. You may take this medication every 4-6 hours as needed for pain.   Do not combine this medication with Tylenol, as it may increase the risk of liver problems.  Do not combine this medication with alcohol.  Please be advised to avoid driving or operating heavy machinery while taking this medication, as it may make you drowsy or impair judgment.   DISCONTINUE ALL MEDICATIONS FROM URGENT CARE. THEY CAN INTERACT WITH OXYCODONE.   Home care instructions:  Please follow any educational materials contained in this packet.   It is very important after a rib fracture to walk and maintain normal activity level.  This will prevent pneumonia.  I also want you to use the incentive spirometer we gave you.  Perform 10 breaths an hour to increase your tolerance of this device.   Follow-up  instructions: Please follow-up with your primary care provider in one week for further assessment of your symptoms as well as a possible enlargement of your pulmonary artery.  You may ultimately need an ultrasound of your heart.  Return instructions:  Please return to the Emergency Department if you experience worsening symptoms.  Please come back to the emergency department if you have any difficulty breathing, pain with breathing, or fever or chills. Please return if you have any other emergent concerns.  Additional Information:   Your vital signs today were: BP 130/64    Pulse 78    Temp 98 F (36.7 C) (Oral)    Resp 18    Ht 5\' 5"  (1.651 m)    Wt 83.9 kg    SpO2 96%    BMI 30.79 kg/m  If your blood pressure (BP) was elevated on multiple readings during this visit above 130 for the top number or above 80 for the bottom number, please have this repeated by your primary care provider within one month. --------------  Thank you for allowing [place] to participate in your care today.

## 2018-12-10 NOTE — ED Provider Notes (Signed)
[facility] EMERGENCY DEPARTMENT Provider Note   CSN: [id number] Arrival date & time: 12/10/18  1253     History   Chief Complaint Chief Complaint  Patient presents with   Fall    HPI Deborah Wise is a 68 y.o. female.     HPI  Patient is a 68 year old female past medical history of type 2 diabetes mellitus, hypertension, CVA, GERD, presenting for fall.  This occurred 4 days ago.  Patient reports that she was rounding her stairs and missed a step, sliding down 3 steps.  She fell onto her left lateral ribs.  She denies any syncope before after the incident. Ever since then she has noticed some bruising, pain with deep breathing, and pain laying on that side.  She also has pain in her head where she hit her head on the fall down.  She takes aspirin but no other blood thinners.  Patient denies any weakness or numbness, vomiting, neck pain, or difficulty with ambulation.  She did present to an urgent care 2 days after the fall, however they did not have imaging modalities.  Patient reports taking tramadol and a muscle relaxant for the pain without relief.  This morning she took meloxicam for her symptoms.  Past Medical History:  Diagnosis Date   Anemia    Arthritis    Blood transfusion without reported diagnosis    Carotid artery occlusion    Cirrhosis ([place])    Diabetes mellitus without complication (HCC)    Fibroid, uterine    GERD (gastroesophageal reflux disease)    Hepatitis    hep C   Hypertension    Stroke ([place])    Thrombocytopenia ([place]) 12/25/2014   Urinary incontinence    Patient noticed mild and is not currently a significant problem    Patient Active Problem List   Diagnosis Date Noted   Cirrhosis of liver ([place]) 06/22/2018   Chronic hepatitis ([place]) 06/22/2018   Hyperlipidemia associated with type 2 diabetes mellitus ([place]) 06/22/2018   Carotid artery disease ([place]) 06/22/2018   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[place])  07/02/2016   Aneurysm of middle cerebral artery    Cerebrovascular accident (CVA) due to thrombosis of left vertebral artery ([place]) 06/28/2016   Cocaine abuse ([place])    Stroke (cerebrum) ([place]) 06/27/2016   Microcytic anemia 12/27/2014   Thrombocytopenia ([place]) 12/25/2014   Hypoglycemia 07/21/2013   Diabetes mellitus ([place]) 07/21/2013   Malignant hypertension 07/21/2013   Liver disease 07/21/2013   Alcohol abuse 07/21/2013    Past Surgical History:  Procedure Laterality Date   ANKLE FRACTURE SURGERY Right 1999   x 2, rod   COLONOSCOPY     IR GENERIC HISTORICAL  06/30/2016   IR ANGIO VERTEBRAL SEL SUBCLAVIAN INNOMINATE UNI R MOD SED 06/30/2016 Deborah Bras, MD [facility]   IR GENERIC HISTORICAL  06/30/2016   IR ANGIO VERTEBRAL SEL VERTEBRAL UNI L MOD SED 06/30/2016 Deborah Bras, MD [facility]   IR GENERIC HISTORICAL  06/30/2016   IR ANGIO INTRA EXTRACRAN SEL COM CAROTID INNOMINATE BILAT MOD SED 06/30/2016 Deborah Bras, MD [facility]   MULTIPLE EXTRACTIONS WITH ALVEOLOPLASTY  10/25/2011   Procedure: MULTIPLE EXTRACION WITH ALVEOLOPLASTY;  Surgeon: Gae Bon, DDS;  Location: [facility];  Service: Oral Surgery;  Laterality: Bilateral;  Extract teeth numbers one, two, six, seven, eight, nine, ten, eleven, twelve, fifteen, sixteen, eighteen, nineteen, twenty-three, twenty-four, twenty-five, twenty-seven, thirty-two and alveoplasty.     OB History    Gravida  5  Para  3   Term  3   Preterm      AB  2   Living  3     SAB  1   TAB  1   Ectopic      Multiple      Live Births               Home Medications    Prior to Admission medications   Medication Sig Start Date End Date Taking? Authorizing Provider  ACCU-CHEK SOFTCLIX LANCETS lancets Accu-Chek Softclix Lancets  USE AS DIRECTED TO CHECK BLOOD SUGAR TWICE A DAY    [provider]  amLODipine (NORVASC) 10 MG tablet Take 1 tablet (10 mg total) by mouth daily. 09/07/18    Isaac Bliss, Rayford Halsted, MD  aspirin EC 81 MG tablet Take 1 tablet (81 mg total) by mouth daily. 06/22/18   Isaac Bliss, Rayford Halsted, MD  atorvastatin (LIPITOR) 80 MG tablet Take 1 tablet (80 mg total) by mouth daily. 09/07/18   Isaac Bliss, Rayford Halsted, MD  Blood Glucose Monitoring Suppl (ACCU-CHEK GUIDE) w/Device KIT Please specify directions, refills and quantity 07/04/18   Isaac Bliss, Rayford Halsted, MD  cyclobenzaprine (FLEXERIL) 5 MG tablet Take 1 tablet (5 mg total) by mouth 2 (two) times daily as needed for muscle spasms. Patient not taking: Reported on 11/22/2018 09/07/18   Isaac Bliss, Rayford Halsted, MD  diclofenac (VOLTAREN) 50 MG EC tablet Take 1 tablet (50 mg total) by mouth 2 (two) times daily. Patient not taking: Reported on 11/22/2018 08/29/18   Nat Christen, MD  glucose blood test strip 1 each by Other route daily. Dx E11.9 E11.69 06/30/18   Isaac Bliss, Rayford Halsted, MD  hydrochlorothiazide (HYDRODIURIL) 25 MG tablet  10/29/18   [provider]  HYDROcodone-acetaminophen (NORCO/VICODIN) 5-325 MG tablet Take 1 tablet by mouth every 4 (four) hours as needed. One tab q4h prn pain Patient not taking: Reported on 11/06/2018 08/29/18   Nat Christen, MD  lisinopril (PRINIVIL,ZESTRIL) 40 MG tablet Take 1 tablet (40 mg total) by mouth daily. 09/07/18   Isaac Bliss, Rayford Halsted, MD  meloxicam (MOBIC) 7.5 MG tablet Take 1 tablet (7.5 mg total) by mouth daily. Patient not taking: Reported on 11/22/2018 09/07/18   Isaac Bliss, Rayford Halsted, MD  metFORMIN (GLUCOPHAGE) 1000 MG tablet Take 500 mg by mouth daily.  11/22/16   [provider]  predniSONE (STERAPRED UNI-PAK 21 TAB) 10 MG (21) TBPK tablet Take as directed Patient not taking: Reported on 11/06/2018 09/22/18   Isaac Bliss, Rayford Halsted, MD  [place] 357 MG/ML SOAJ INJECT 140 MG INTO THE SKIN EVERY 28 (FOURTEEN) DAYS. 12/06/18   Garvin [place], MD    Family History Family History  Problem Relation Age of Onset    Diabetes type II Mother    Hypertension Mother    Stroke Mother    Cancer Father        patient does not know what type of cancer   Diabetes Daughter    Breast cancer Neg Hx    Colon polyps Neg Hx    Crohn's disease Neg Hx    Esophageal cancer Neg Hx    Rectal cancer Neg Hx    Stomach cancer Neg Hx    Colon cancer Neg Hx     Social History Social History   Tobacco Use   Smoking status: Never Smoker   Smokeless tobacco: Never Used  Substance Use Topics   Alcohol use: Yes  Alcohol/week: 1.0 standard drinks    Types: 1 Cans of beer per week    Comment: 1/2 bottle of wine and some beer a day   Drug use: Not Currently    Frequency: 1.0 times per week    Types: Cocaine    Comment: last use cocaine last year 2018     Allergies   Patient has no known allergies.   Review of Systems Review of Systems  Constitutional: Negative for chills and fever.  Respiratory: Negative for shortness of breath.        +Pain on breathing  Gastrointestinal: Negative for nausea and vomiting.  Musculoskeletal: Positive for arthralgias and myalgias.  Skin: Negative for color change and wound.  Neurological: Negative for weakness and numbness.  All other systems reviewed and are negative.    Physical Exam Updated Vital Signs BP 140/87    Pulse 84    Temp 98 F (36.7 C) (Oral)    Resp 18    Ht '5\' 5"'$  (1.651 m)    Wt 83.9 kg    SpO2 97%    BMI 30.79 kg/m   Physical Exam Vitals signs and nursing note reviewed.  Constitutional:      General: She is not in acute distress.    Appearance: She is well-developed.  HENT:     Head: Normocephalic.     Comments: Patient has hematoma to left parieto-occipital scalp.  Tender to touch. No battle sign. Eyes:     Conjunctiva/sclera: Conjunctivae normal.     Pupils: Pupils are equal, round, and reactive to light.  Neck:     Musculoskeletal: Normal range of motion and neck supple.     Comments: No midline TTP.  Cardiovascular:      Rate and Rhythm: Normal rate and regular rhythm.     Heart sounds: S1 normal and S2 normal. No murmur.  Pulmonary:     Effort: Pulmonary effort is normal.     Breath sounds: Normal breath sounds. No wheezing or rales.     Comments: Patient has tenderness to palpation of the left lateral ribs as well as ecchymosis. Chest:     Chest wall: Tenderness present.  Abdominal:     General: There is no distension.     Palpations: Abdomen is soft.     Tenderness: There is abdominal tenderness. There is no guarding.     Comments: Patient has left upper quadrant tenderness without guarding or rebound.  Musculoskeletal: Normal range of motion.        General: No deformity.  Lymphadenopathy:     Cervical: No cervical adenopathy.  Skin:    General: Skin is warm and dry.     Findings: No erythema or rash.  Neurological:     Mental Status: She is alert.     Comments: Cranial nerves grossly intact. Patient moves extremities symmetrically and with good coordination.  Psychiatric:        Behavior: Behavior normal.        Thought Content: Thought content normal.        Judgment: Judgment normal.      ED Treatments / Results  Labs (all labs ordered are listed, but only abnormal results are displayed) Labs Reviewed  BASIC METABOLIC PANEL - Abnormal; Notable for the following components:      Result Value   Glucose, Bld 126 (*)    Creatinine, Ser 1.05 (*)    GFR calc non Af Amer 55 (*)    All other components within normal  limits  CBC WITH DIFFERENTIAL/PLATELET - Abnormal; Notable for the following components:   RBC 5.13 (*)    MCV 77.2 (*)    MCH 24.0 (*)    RDW 15.6 (*)    Platelets 142 (*)    All other components within normal limits    EKG None  Radiology Ct Head Wo Contrast  Result Date: 12/10/2018 CLINICAL DATA:  Patient status post fall down the stairs. EXAM: CT HEAD WITHOUT CONTRAST CT CERVICAL SPINE WITHOUT CONTRAST TECHNIQUE: Multidetector CT imaging of the head and cervical  spine was performed following the standard protocol without intravenous contrast. Multiplanar CT image reconstructions of the cervical spine were also generated. COMPARISON:  None. FINDINGS: CT HEAD FINDINGS Brain: Ventricles and sulci are appropriate for patient's age. No evidence for acute cortically based infarct, intracranial hemorrhage, mass lesion or mass-effect. Chronic hypodensity within the left paramedian pons suggestive of old infarct. Vascular: Unremarkable Skull: Intact. Sinuses/Orbits: Paranasal sinuses are well aerated. Mastoid air cells are unremarkable. Orbits are unremarkable. Other: Soft tissue hematoma overlying the left posterior parietal scalp. CT CERVICAL SPINE FINDINGS Alignment: Normal. Skull base and vertebrae: No acute fracture. No primary bone lesion or focal pathologic process. Soft tissues and spinal canal: No prevertebral fluid or swelling. No visible canal hematoma. Disc levels: Degenerative disc disease most pronounced C5-6 and C6-7. Upper chest: Unremarkable Other: None IMPRESSION: No acute intracranial process. No acute cervical spine fracture.  Degenerative disc disease. Electronically Signed   By: Deborah Newcomer M.D.   On: 12/10/2018 16:03   Ct Chest W Contrast  Result Date: 12/10/2018 CLINICAL DATA:  Fall down stairs on Wednesday. Left-sided pain. Rib fracture suspected. Known cirrhosis and hepatitis-C. EXAM: CT CHEST, ABDOMEN, AND PELVIS WITH CONTRAST TECHNIQUE: Multidetector CT imaging of the chest, abdomen and pelvis was performed following the standard protocol during bolus administration of intravenous contrast. CONTRAST:  167m OMNIPAQUE IOHEXOL 300 MG/ML  SOLN COMPARISON:  Chest radiograph of earlier in the day. Abdominopelvic CT of 01/14/2016. Right upper quadrant ultrasound of 04/12/2018. FINDINGS: CT CHEST FINDINGS Cardiovascular: Aortic atherosclerosis. Mild cardiomegaly, without pericardial effusion. Multivessel coronary artery atherosclerosis. Pulmonary artery  enlargement, outflow tract 3.1 cm. Mediastinum/Nodes: No mediastinal or hilar adenopathy. Lungs/Pleura: No pleural fluid. Minimal motion degradation. Bibasilar atelectasis. No pulmonary contusion or pneumothorax. Musculoskeletal: Equivocal ninth posterolateral left rib irregularity including on 42/6 CT ABDOMEN PELVIS FINDINGS Hepatobiliary: Moderate cirrhosis, without focal liver lesion. Hepatomegaly at 18.9 cm. Normal gallbladder, without biliary ductal dilatation. Pancreas: Suspected lipoma within the uncinate process at 1.0 cm on 62/6. Spleen: Normal in size, without focal abnormality. Adrenals/Urinary Tract: Normal adrenal glands. Normal kidneys, without hydronephrosis. Favor contrast within the right-side of the urinary bladder on 108/6. Stomach/Bowel: Normal stomach, without wall thickening. Scattered colonic diverticula. Normal terminal ileum. Normal small bowel. Vascular/Lymphatic: Advanced aortic and branch vessel atherosclerosis. Patent portal and splenic veins, without specific evidence of portal venous hypertension. No abdominopelvic adenopathy. Reproductive: Fibroid uterus, with multiple calcified masses of up to 8.5 cm. No adnexal mass. Other: No significant free fluid.  Mild pelvic floor laxity. Musculoskeletal: No acute osseous abnormality. Advanced lumbosacral spondylosis. IMPRESSION: 1. Equivocal ninth left rib irregularity. Correlate with point tenderness. Otherwise, no acute or posttraumatic deformity identified. 2. Cirrhosis and hepatomegaly. 3. Age advanced coronary artery atherosclerosis. Recommend assessment of coronary risk factors and consideration of medical therapy. 4.  Aortic Atherosclerosis (ICD10-I70.0). 5. Pulmonary artery enlargement suggests pulmonary arterial hypertension. 6. Fibroid uterus. Electronically Signed   By: Deborah MiyamotoM.D.   On: 12/10/2018 16:20  Ct Cervical Spine Wo Contrast  Result Date: 12/10/2018 CLINICAL DATA:  Patient status post fall down the stairs. EXAM:  CT HEAD WITHOUT CONTRAST CT CERVICAL SPINE WITHOUT CONTRAST TECHNIQUE: Multidetector CT imaging of the head and cervical spine was performed following the standard protocol without intravenous contrast. Multiplanar CT image reconstructions of the cervical spine were also generated. COMPARISON:  None. FINDINGS: CT HEAD FINDINGS Brain: Ventricles and sulci are appropriate for patient's age. No evidence for acute cortically based infarct, intracranial hemorrhage, mass lesion or mass-effect. Chronic hypodensity within the left paramedian pons suggestive of old infarct. Vascular: Unremarkable Skull: Intact. Sinuses/Orbits: Paranasal sinuses are well aerated. Mastoid air cells are unremarkable. Orbits are unremarkable. Other: Soft tissue hematoma overlying the left posterior parietal scalp. CT CERVICAL SPINE FINDINGS Alignment: Normal. Skull base and vertebrae: No acute fracture. No primary bone lesion or focal pathologic process. Soft tissues and spinal canal: No prevertebral fluid or swelling. No visible canal hematoma. Disc levels: Degenerative disc disease most pronounced C5-6 and C6-7. Upper chest: Unremarkable Other: None IMPRESSION: No acute intracranial process. No acute cervical spine fracture.  Degenerative disc disease. Electronically Signed   By: Deborah Newcomer M.D.   On: 12/10/2018 16:03   Ct Abdomen Pelvis W Contrast  Result Date: 12/10/2018 CLINICAL DATA:  Fall down stairs on Wednesday. Left-sided pain. Rib fracture suspected. Known cirrhosis and hepatitis-C. EXAM: CT CHEST, ABDOMEN, AND PELVIS WITH CONTRAST TECHNIQUE: Multidetector CT imaging of the chest, abdomen and pelvis was performed following the standard protocol during bolus administration of intravenous contrast. CONTRAST:  157m OMNIPAQUE IOHEXOL 300 MG/ML  SOLN COMPARISON:  Chest radiograph of earlier in the day. Abdominopelvic CT of 01/14/2016. Right upper quadrant ultrasound of 04/12/2018. FINDINGS: CT CHEST FINDINGS Cardiovascular: Aortic  atherosclerosis. Mild cardiomegaly, without pericardial effusion. Multivessel coronary artery atherosclerosis. Pulmonary artery enlargement, outflow tract 3.1 cm. Mediastinum/Nodes: No mediastinal or hilar adenopathy. Lungs/Pleura: No pleural fluid. Minimal motion degradation. Bibasilar atelectasis. No pulmonary contusion or pneumothorax. Musculoskeletal: Equivocal ninth posterolateral left rib irregularity including on 42/6 CT ABDOMEN PELVIS FINDINGS Hepatobiliary: Moderate cirrhosis, without focal liver lesion. Hepatomegaly at 18.9 cm. Normal gallbladder, without biliary ductal dilatation. Pancreas: Suspected lipoma within the uncinate process at 1.0 cm on 62/6. Spleen: Normal in size, without focal abnormality. Adrenals/Urinary Tract: Normal adrenal glands. Normal kidneys, without hydronephrosis. Favor contrast within the right-side of the urinary bladder on 108/6. Stomach/Bowel: Normal stomach, without wall thickening. Scattered colonic diverticula. Normal terminal ileum. Normal small bowel. Vascular/Lymphatic: Advanced aortic and branch vessel atherosclerosis. Patent portal and splenic veins, without specific evidence of portal venous hypertension. No abdominopelvic adenopathy. Reproductive: Fibroid uterus, with multiple calcified masses of up to 8.5 cm. No adnexal mass. Other: No significant free fluid.  Mild pelvic floor laxity. Musculoskeletal: No acute osseous abnormality. Advanced lumbosacral spondylosis. IMPRESSION: 1. Equivocal ninth left rib irregularity. Correlate with point tenderness. Otherwise, no acute or posttraumatic deformity identified. 2. Cirrhosis and hepatomegaly. 3. Age advanced coronary artery atherosclerosis. Recommend assessment of coronary risk factors and consideration of medical therapy. 4.  Aortic Atherosclerosis (ICD10-I70.0). 5. Pulmonary artery enlargement suggests pulmonary arterial hypertension. 6. Fibroid uterus. Electronically Signed   By: Deborah MiyamotoM.D.   On: 12/10/2018  16:20   Dg Chest Portable 1 View  Result Date: 12/10/2018 CLINICAL DATA:  Chest pain, shortness of breath. EXAM: PORTABLE CHEST 1 VIEW COMPARISON:  Radiograph of Oct 19, 2011. FINDINGS: The heart size and mediastinal contours are within normal limits. No pneumothorax or pleural effusion is noted. Mild bibasilar subsegmental atelectasis is  noted. The visualized skeletal structures are unremarkable. IMPRESSION: Mild bibasilar subsegmental atelectasis. Electronically Signed   By: Marijo Conception M.D.   On: 12/10/2018 13:44    Procedures Procedures (including critical care time)  Medications Ordered in ED Medications  HYDROcodone-acetaminophen (NORCO/VICODIN) 5-325 MG per tablet 1 tablet (has no administration in time range)  lidocaine (LIDODERM) 5 % 1 patch (has no administration in time range)     Initial Impression / Assessment and Plan / ED Course  I have reviewed the triage vital signs and the nursing notes.  Pertinent labs & imaging results that were available during my care of the patient were reviewed by me and considered in my medical decision making (see chart for details).        This is a 68 year old female with past medical history of type 2 diabetes mellitus, CVA, liver disease presenting for fall. On exam patient has with breathing as well as tightness palpation ecchymosis on the left side of her ribs, suspect rib fracture.  She is also having left upper quadrant pain, given proximity to kidney and spleen, will obtain CT chest as well as abdomen pelvis to assess for solid organ injury.  Will obtain CT head and cervical spine due to hematoma and pain.  CT head and cervical spine are unremarkable.  CT chest demonstrates equivocal finding for possible ninth left rib fracture.  This correlates with patient's point tenderness.  She also has findings of dilated pulmonary artery possibly concerning for pulmonary hypertension.  She was informed of these results and instructed to  follow-up with primary care provider.  CT abdomen and pelvis revealing liver appearing cirrhotic, consistent with prior history.  Additionally, she has a large fibroid uterus.  Instructed to follow-up  with OB/GYN for this.  Patient given incentive spirometer and instructed to increase activity and ambulation to prevent pneumonia.  She is also given pain control, oxycodone 2.5 mg every 4-6 hours as needed for pain.  She was instructed to discontinue any sedating medications including the couple pills of tramadol that she had left over.  Return precautions given for any fevers, cough, shortness of breath or worsening pain.  Patient is in understanding and agrees with the plan of care.  This is a shared visit with Dr. Quintella Reichert. Patient was independently evaluated by this attending physician. Attending physician consulted in evaluation and discharge management.  Final Clinical Impressions(s) / ED Diagnoses   Final diagnoses:  Closed fracture of one rib of left side, initial encounter  Uterine leiomyoma, unspecified location  Pulmonary artery abnormality      Tamala Julian 12/10/18 2031    Quintella Reichert, MD 12/13/18 1133

## 2018-12-10 NOTE — ED Triage Notes (Signed)
Patient states she fell on some concrete steps last Wednesday and has been having increasingly generalized left sided pain since. States she went to [facility] but they were unable to do any scans. Pt was given pain medicine but was not controlling her pain.

## 2018-12-10 NOTE — ED Notes (Signed)
ED Provider at bedside. 

## 2018-12-12 ENCOUNTER — Other Ambulatory Visit: Payer: Self-pay

## 2018-12-12 ENCOUNTER — Ambulatory Visit (INDEPENDENT_AMBULATORY_CARE_PROVIDER_SITE_OTHER): Payer: Medicare Other | Admitting: Family Medicine

## 2018-12-12 DIAGNOSIS — S2232XA Fracture of one rib, left side, initial encounter for closed fracture: Secondary | ICD-10-CM

## 2018-12-12 MED ORDER — OXYCODONE HCL 5 MG PO TABS: 5.0000 mg | ORAL_TABLET | Freq: Four times a day (QID) | ORAL | 0 refills | Status: DC | PRN

## 2018-12-12 NOTE — Progress Notes (Signed)
Patient ID: Deborah Wise, female   DOB: [birth year], 68 y.o.   MRN: [id number]  This visit type was conducted due to national recommendations for restrictions regarding the COVID-19 pandemic in an effort to limit this patient's exposure and mitigate transmission in our community.   Virtual Visit via Telephone Note  I connected with Deborah Wise on 12/12/18 at  8:30 AM EDT by telephone and verified that I am speaking with the correct person using two identifiers.   I discussed the limitations, risks, security and privacy concerns of performing an evaluation and management service by telephone and the availability of in person appointments. I also discussed with the patient that there may be a patient responsible charge related to this service. The patient expressed understanding and agreed to proceed.  Location patient: home Location provider: work or home office Participants present for the call: patient, provider Patient did not have a visit in the prior 7 days to address this/these issue(s).   History of Present Illness:  Patient had recent fall last Wednesday.  She states she missed a step and ended up falling down a few steps and landed on her left lateral rib cage area.  Also hit her head.  No loss of consciousness.  She went to urgent care on Friday no x-rays were done.  She then went to [facility] ER on Sunday.  CT chest revealed equivocal left ninth rib fracture.  That correlated with her area of pain.  She had CT abdomen pelvis with no acute findings.  CT head and neck with no acute findings.  She apparently had a hematoma of the head.  No confusion.  No syncope.  No headaches since then.  She is using incentive spirometer.  She was prescribed oxycodone which helps slightly.  She was told to take a half a tablet of 5 mg but is been taking a whole but is still had frequent pain 7-8 out of 10.  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  She states she has been  abstinent for several months.   Observations/Objective: Patient sounds cheerful and well on the phone. I do not appreciate any SOB. Speech and thought processing are grossly intact. Patient reported vitals:  Assessment and Plan:  Left ninth rib fracture following fall.  Moderate to severe pain at times  -Cautious use of oxycodone 5 mg every 6 hours as needed for severe pain #20 with no refill -Discussed measures to reduce constipation -Milk of magnesia as needed and/or stool softener -Continue frequent use of incentive spirometer -Patient is aware this will take several weeks to fully heal  Follow Up Instructions:  -Follow-up with primary as needed   [id number] [id number] [id number] 11-20 [id number] 21-30 I did not refer this patient for an OV in the next 24 hours for this/these issue(s).  I discussed the assessment and treatment plan with the patient. The patient was provided an opportunity to ask questions and all were answered. The patient agreed with the plan and demonstrated an understanding of the instructions.   The patient was advised to call back or seek an in-person evaluation if the symptoms worsen or if the condition fails to improve as anticipated.  I provided 15 minutes of non-face-to-face time during this encounter.   Carolann Littler, MD

## 2018-12-22 ENCOUNTER — Ambulatory Visit (INDEPENDENT_AMBULATORY_CARE_PROVIDER_SITE_OTHER): Payer: Medicare Other | Admitting: Internal Medicine

## 2018-12-22 ENCOUNTER — Other Ambulatory Visit: Payer: Self-pay

## 2018-12-22 ENCOUNTER — Encounter: Payer: Self-pay | Admitting: Internal Medicine

## 2018-12-22 VITALS — BP 140/90 | HR 94 | Temp 98.5°F | Wt 179.9 lb

## 2018-12-22 DIAGNOSIS — E1149 Type 2 diabetes mellitus with other diabetic neurological complication: Secondary | ICD-10-CM | POA: Diagnosis not present

## 2018-12-22 DIAGNOSIS — I10 Essential (primary) hypertension: Secondary | ICD-10-CM | POA: Diagnosis not present

## 2018-12-22 DIAGNOSIS — E1169 Type 2 diabetes mellitus with other specified complication: Secondary | ICD-10-CM | POA: Diagnosis not present

## 2018-12-22 DIAGNOSIS — D696 Thrombocytopenia, unspecified: Secondary | ICD-10-CM

## 2018-12-22 DIAGNOSIS — F1010 Alcohol abuse, uncomplicated: Secondary | ICD-10-CM

## 2018-12-22 DIAGNOSIS — F1410 Cocaine abuse, uncomplicated: Secondary | ICD-10-CM

## 2018-12-22 DIAGNOSIS — E785 Hyperlipidemia, unspecified: Secondary | ICD-10-CM

## 2018-12-22 DIAGNOSIS — I63012 Cerebral infarction due to thrombosis of left vertebral artery: Secondary | ICD-10-CM

## 2018-12-22 DIAGNOSIS — K7030 Alcoholic cirrhosis of liver without ascites: Secondary | ICD-10-CM | POA: Diagnosis not present

## 2018-12-22 DIAGNOSIS — R011 Cardiac murmur, unspecified: Secondary | ICD-10-CM

## 2018-12-22 MED ORDER — HYDRALAZINE HCL 25 MG PO TABS: 25.0000 mg | ORAL_TABLET | Freq: Three times a day (TID) | ORAL | 2 refills | Status: DC

## 2018-12-22 NOTE — Patient Instructions (Signed)
-  Nice seeing you today!!  -Start taking hydralazine 25 mg three times a day for blood pressure in addition to your other medications.  -US of the heart will be ordered to evaluate your heart murmur.  -Schedule follow up in 3 months for your physical; come in fasting that day.

## 2018-12-22 NOTE — Progress Notes (Signed)
Established Patient Office Visit     CC/Reason for Visit: Scheduled follow-up of chronic conditions  HPI: Deborah Wise is a 68 y.o. female who is coming in today for the above mentioned reasons. Past Medical History is significant for: CVA on January 2018, uncontrolled hypertension, well-controlled type 2 diabetes, hyperlipidemia, history of alcohol and polysubstance abuse including cocaine and marijuana.  History of carotid artery disease followed by vascular surgery.  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  Thrombocytopenia due to splenomegaly.  Since I last saw her she fell and hit her head and has a small hematoma, she also suffered 1/9 rib fracture, although this was assessed in the emergency department.  The rib feels much better but his head is still bothering her a little bit.  She states she has been compliant with her diet and with her medications.  She is able to name  the 3 blood pressure medications that she is currently on.   Past Medical/Surgical History: Past Medical History:  Diagnosis Date  . Anemia   . Arthritis   . Blood transfusion without reported diagnosis   . Carotid artery occlusion   . Cirrhosis ([place])   . Diabetes mellitus without complication ([place])   . Fibroid, uterine   . GERD (gastroesophageal reflux disease)   . Hepatitis    hep C  . Hypertension   . Stroke ([place])   . Thrombocytopenia ([place]) 12/25/2014  . Urinary incontinence    Patient noticed mild and is not currently a significant problem    Past Surgical History:  Procedure Laterality Date  . ANKLE FRACTURE SURGERY Right 1999   x 2, rod  . COLONOSCOPY    . IR GENERIC HISTORICAL  06/30/2016   IR ANGIO VERTEBRAL SEL SUBCLAVIAN INNOMINATE UNI R MOD SED 06/30/2016 [name], MD [facility]  . IR GENERIC HISTORICAL  06/30/2016   IR ANGIO VERTEBRAL SEL VERTEBRAL UNI L MOD SED 06/30/2016 [name], MD [facility]  . IR GENERIC HISTORICAL   06/30/2016   IR ANGIO INTRA EXTRACRAN SEL COM CAROTID INNOMINATE BILAT MOD SED 06/30/2016 [name], MD [facility]  . MULTIPLE EXTRACTIONS WITH ALVEOLOPLASTY  10/25/2011   Procedure: MULTIPLE EXTRACION WITH ALVEOLOPLASTY;  Surgeon: Gae Bon, DDS;  Location: [place];  Service: Oral Surgery;  Laterality: Bilateral;  Extract teeth numbers one, two, six, seven, eight, nine, ten, eleven, twelve, fifteen, sixteen, eighteen, nineteen, twenty-three, twenty-four, twenty-five, twenty-seven, thirty-two and alveoplasty.    Social History:  reports that she has never smoked. She has never used smokeless tobacco. She reports current alcohol use of about 1.0 standard drinks of alcohol per week. She reports previous drug use. Frequency: 1.00 time per week. Drug: Cocaine.  Allergies: No Known Allergies  Family History:  Family History  Problem Relation Age of Onset  . Diabetes type II Mother   . Hypertension Mother   . Stroke Mother   . Cancer Father        patient does not know what type of cancer  . Diabetes Daughter   . Breast cancer Neg Hx   . Colon polyps Neg Hx   . Crohn's disease Neg Hx   . Esophageal cancer Neg Hx   . Rectal cancer Neg Hx   . Stomach cancer Neg Hx   . Colon cancer Neg Hx      Current Outpatient Medications:  .  ACCU-CHEK SOFTCLIX LANCETS lancets, Accu-Chek Softclix Lancets  USE AS DIRECTED  TO CHECK BLOOD SUGAR TWICE A DAY, Disp: , Rfl:  .  amLODipine (NORVASC) 10 MG tablet, Take 1 tablet (10 mg total) by mouth daily., Disp: 90 tablet, Rfl: 1 .  aspirin EC 81 MG tablet, Take 1 tablet (81 mg total) by mouth daily., Disp: 31 tablet, Rfl: 11 .  atorvastatin (LIPITOR) 80 MG tablet, Take 1 tablet (80 mg total) by mouth daily., Disp: 90 tablet, Rfl: 3 .  Blood Glucose Monitoring Suppl (ACCU-CHEK GUIDE) w/Device KIT, Please specify directions, refills and quantity, Disp: 1 kit, Rfl: 0 .  diclofenac (VOLTAREN) 50 MG EC tablet, Take 1 tablet (50 mg total) by mouth 2  (two) times daily., Disp: 20 tablet, Rfl: 0 .  glucose blood test strip, 1 each by Other route daily. Dx E11.9 E11.69, Disp: 100 each, Rfl: 3 .  hydrochlorothiazide (HYDRODIURIL) 25 MG tablet, , Disp: , Rfl:  .  lisinopril (PRINIVIL,ZESTRIL) 40 MG tablet, Take 1 tablet (40 mg total) by mouth daily., Disp: 90 tablet, Rfl: 3 .  meloxicam (MOBIC) 7.5 MG tablet, Take 1 tablet (7.5 mg total) by mouth daily., Disp: 30 tablet, Rfl: 1 .  metFORMIN (GLUCOPHAGE) 1000 MG tablet, Take 500 mg by mouth daily. , Disp: , Rfl: 2 .  oxyCODONE (ROXICODONE) 5 MG immediate release tablet, Take 1 tablet (5 mg total) by mouth every 6 (six) hours as needed for severe pain., Disp: 20 tablet, Rfl: 0 .  predniSONE (STERAPRED UNI-PAK 21 TAB) 10 MG (21) TBPK tablet, Take as directed, Disp: 21 tablet, Rfl: 0 .  REPATHA SURECLICK 140 MG/ML SOAJ, INJECT 140 MG INTO THE SKIN EVERY 14 (FOURTEEN) DAYS., Disp: 2 pen, Rfl: 3 .  hydrALAZINE (APRESOLINE) 25 MG tablet, Take 1 tablet (25 mg total) by mouth 3 (three) times daily., Disp: 90 tablet, Rfl: 2  Current Facility-Administered Medications:  .  0.9 %  sodium chloride infusion, 500 mL, Intravenous, Once, Perry, John N, MD .  0.9 %  sodium chloride infusion, 500 mL, Intravenous, Once, Perry, John N, MD  Review of Systems:  Constitutional: Denies fever, chills, diaphoresis, appetite change and fatigue.  HEENT: Denies photophobia, eye pain, redness, hearing loss, ear pain, congestion, sore throat, rhinorrhea, sneezing, mouth sores, trouble swallowing, neck pain, neck stiffness and tinnitus.   Respiratory: Denies SOB, DOE, cough, chest tightness,  and wheezing.   Cardiovascular: Denies chest pain, palpitations and leg swelling.  Gastrointestinal: Denies nausea, vomiting, abdominal pain, diarrhea, constipation, blood in stool and abdominal distention.  Genitourinary: Denies dysuria, urgency, frequency, hematuria, flank pain and difficulty urinating.  Endocrine: Denies: hot or cold  intolerance, sweats, changes in hair or nails, polyuria, polydipsia. Musculoskeletal: Denies myalgias, back pain, joint swelling, arthralgias and gait problem.  Skin: Denies pallor, rash and wound.  Neurological: Denies dizziness, seizures, syncope, weakness, light-headedness, numbness and headaches.  Hematological: Denies adenopathy. Easy bruising, personal or family bleeding history  Psychiatric/Behavioral: Denies suicidal ideation, mood changes, confusion, nervousness, sleep disturbance and agitation    Physical Exam: Vitals:   12/22/18 1316  BP: 140/90  Pulse: 94  Temp: 98.5 F (36.9 C)  TempSrc: Oral  SpO2: 96%  Weight: 179 lb 14.4 oz (81.6 kg)    Body mass index is 29.94 kg/m.   Constitutional: NAD, calm, comfortable Eyes: PERRL, lids and conjunctivae normal ENMT: Mucous membranes are moist.  Neck: normal, supple, no masses, no thyromegaly Respiratory: clear to auscultation bilaterally, no wheezing, no crackles. Normal respiratory effort. No accessory muscle use.  Cardiovascular: Regular rate and rhythm, prominent systolic ejection   murmur best heard at the right upper sternal border/ rubs / gallops. No extremity edema. 2+ pedal pulses. No carotid bruits.  Abdomen: no tenderness, no masses palpated. No hepatosplenomegaly. Bowel sounds positive.  Musculoskeletal: no clubbing / cyanosis. No joint deformity upper and lower extremities. Good ROM, no contractures. Normal muscle tone.  Skin: no rashes, lesions, ulcers. No induration Neurologic: CN 2-12 grossly intact. Sensation intact, DTR normal. Strength 5/5 in all 4.  Psychiatric: Normal judgment and insight. Alert and oriented x 3. Normal mood.    Impression and Plan:  Alcoholic cirrhosis of liver without ascites (HCC) -Follows with GI.  Hyperlipidemia associated with type 2 diabetes mellitus (HCC) -Unable to tolerate statin due to cirrhosis and transaminitis, is on a PSK 9 inhibitor.  Malignant hypertension  -Blood  pressures improved but still not at goal. -She is able to mention the 3 blood pressure medications she is on: Hydrochlorothiazide, amlodipine, lisinopril, all 3 of these are at maximal doses. -Would like to avoid beta-blockers given her history of cocaine use. -Will add hydralazine 25 mg 3 times a day.  Type 2 diabetes mellitus with other neurologic complication, without long-term current use of insulin (HCC) -Has been well controlled, most recent A1c was 5.8.  Cocaine abuse (HCC) -States her last use was last year.  Cerebrovascular accident (CVA) due to thrombosis of left vertebral artery (HCC) -No significant deficits following CVA. -She does have carotid artery stenosis and follows with vascular surgery.  Thrombocytopenia (HCC) -Presumably due to her history of cirrhosis, check platelets when she returns for physical.  Alcohol abuse -Have advised thiamine and folic acid supplement. -She states she no longer drinks.  Systolic murmur  -I do not recall auscultating a murmur on prior visits. -Echo in 2018 does not show any valvular issues. -Repeat 2D echo today.    Patient Instructions  -Nice seeing you today!!  -Start taking hydralazine 25 mg three times a day for blood pressure in addition to your other medications.  -US of the heart will be ordered to evaluate your heart murmur.  -Schedule follow up in 3 months for your physical; come in fasting that day.     Estela Hernandez Acosta, MD [facility]   

## 2019-01-03 ENCOUNTER — Telehealth: Payer: Self-pay

## 2019-01-03 DIAGNOSIS — I639 Cerebral infarction, unspecified: Secondary | ICD-10-CM

## 2019-01-03 DIAGNOSIS — Z9181 History of falling: Secondary | ICD-10-CM

## 2019-01-03 DIAGNOSIS — I6350 Cerebral infarction due to unspecified occlusion or stenosis of unspecified cerebral artery: Secondary | ICD-10-CM

## 2019-01-03 DIAGNOSIS — I63012 Cerebral infarction due to thrombosis of left vertebral artery: Secondary | ICD-10-CM

## 2019-01-03 NOTE — Telephone Encounter (Signed)
There is an referral for PT.  Okay for home health?

## 2019-01-03 NOTE — Telephone Encounter (Signed)
Yes

## 2019-01-03 NOTE — Telephone Encounter (Signed)
Copied from [place] [id number]. Topic: General - Other >> Jan 03, 2019 11:09 AM Burchel, Abbi R wrote: Reason for CRM: Pt would like inquire about ongoing home health aide.  Please call [phone], or [phone]

## 2019-01-05 NOTE — Telephone Encounter (Signed)
Order placed

## 2019-01-09 NOTE — Addendum Note (Signed)
Addended by: Westley Hummer B on: 01/09/2019 01:26 PM   Modules accepted: Orders

## 2019-01-09 NOTE — Telephone Encounter (Signed)
Message to Lindy. Left message yesterday in Epic by [facility] Ronalee Belts) order you placed yesterday needs to be chg to Physical Therapy to continue processing as referral. You can just change it or if questions call his at [phone]

## 2019-01-09 NOTE — Telephone Encounter (Signed)
Referral placed for PT

## 2019-01-11 ENCOUNTER — Other Ambulatory Visit: Payer: Self-pay | Admitting: *Deleted

## 2019-01-11 MED ORDER — ACCU-CHEK FASTCLIX LANCETS MISC: 1.0000 | Freq: Every day | 3 refills | Status: DC

## 2019-01-15 ENCOUNTER — Telehealth: Payer: Self-pay

## 2019-01-15 DIAGNOSIS — K7469 Other cirrhosis of liver: Secondary | ICD-10-CM | POA: Diagnosis not present

## 2019-01-15 NOTE — Telephone Encounter (Signed)
PA for Rep[facility] done on cover my meds. Approve from  till 06/07/2019. Contact number for [facility] rx is  [phone].

## 2019-01-17 ENCOUNTER — Telehealth: Payer: Self-pay | Admitting: Internal Medicine

## 2019-01-17 DIAGNOSIS — M5431 Sciatica, right side: Secondary | ICD-10-CM

## 2019-01-17 DIAGNOSIS — I10 Essential (primary) hypertension: Secondary | ICD-10-CM

## 2019-01-17 MED ORDER — MELOXICAM 7.5 MG PO TABS: 7.5000 mg | ORAL_TABLET | Freq: Every day | ORAL | 1 refills | Status: DC

## 2019-01-17 MED ORDER — HYDRALAZINE HCL 25 MG PO TABS: 25.0000 mg | ORAL_TABLET | Freq: Three times a day (TID) | ORAL | 1 refills | Status: DC

## 2019-01-17 MED ORDER — AMLODIPINE BESYLATE 10 MG PO TABS: 10.0000 mg | ORAL_TABLET | Freq: Every day | ORAL | 1 refills | Status: DC

## 2019-01-17 NOTE — Telephone Encounter (Signed)
[place], [facility]  [address] [place] [address]  Phone: [phone] Fax: [phone]  Not a 24 hour pharmacy; exact hours not known.

## 2019-01-17 NOTE — Telephone Encounter (Signed)
Pharmacy called and is requesting to have pts hydralazine, amlodipine, and meloxicam set to a 90 day supply. Please advise.

## 2019-01-23 ENCOUNTER — Other Ambulatory Visit: Payer: Self-pay | Admitting: Internal Medicine

## 2019-01-23 DIAGNOSIS — M5431 Sciatica, right side: Secondary | ICD-10-CM

## 2019-01-24 DIAGNOSIS — K7469 Other cirrhosis of liver: Secondary | ICD-10-CM | POA: Diagnosis not present

## 2019-02-05 ENCOUNTER — Other Ambulatory Visit: Payer: Self-pay | Admitting: Internal Medicine

## 2019-02-05 NOTE — Telephone Encounter (Signed)
See request °

## 2019-02-05 NOTE — Telephone Encounter (Signed)
Requested medication (s) are due for refill today: yes  Requested medication (s) are on the active medication list: yes  Last refill:  12/2018  Future visit scheduled: no  Notes to clinic:  This refill cannot be delegated   Requested Prescriptions  Pending Prescriptions Disp Refills   oxyCODONE (ROXICODONE) 5 MG immediate release tablet 20 tablet 0    Sig: Take 1 tablet (5 mg total) by mouth every 6 (six) hours as needed for severe pain.     Not Delegated - Analgesics:  Opioid Agonists Failed - 02/05/2019  2:56 PM      Failed - This refill cannot be delegated      Failed - Urine Drug Screen completed in last 360 days.      Passed - Valid encounter within last 6 months    Recent Outpatient Visits          1 month ago Alcoholic cirrhosis of liver without ascites ([place])   [place] at [facility], Rayford Halsted, MD   1 month ago Closed fracture of one rib of left side, initial Electronics [facility], Alinda Sierras, MD   5 months ago Right sided sciatica   [facility]rap[facility], [facility] at [facility], Rayford Halsted, MD   6 months ago Malignant hypertension   [place] at [facility], Rayford Halsted, MD   7 months ago Hypertension associated with diabetes [facility])   [place] at [facility], Rayford Halsted, MD              metFORMIN (GLUCOPHAGE) 1000 MG tablet  2    Sig: Take 0.5 tablets (500 mg total) by mouth daily.     Endocrinology:  Diabetes - Biguanides Failed - 02/05/2019  2:56 PM      Failed - Cr in normal range and within 360 days    Creatinine  Date Value Ref Range Status  12/31/2014 0.8 0.6 - 1.1 mg/dL Final   Creatinine, Ser  Date Value Ref Range Status  12/10/2018 1.05 (H) 0.44 - 1.00 mg/dL Final         Failed - HBA1C is between 0 and 7.9 and within 180 days    Hgb A1c MFr Bld  Date Value Ref Range Status  08/02/2018 5.8 (H) 4.8 - 5.6 % Final    Comment:   Prediabetes: 5.7 - 6.4          Diabetes: >6.4          Glycemic control for adults with diabetes: <7.0          Passed - eGFR in normal range and within 360 days    GFR calc Af Amer  Date Value Ref Range Status  12/10/2018 >60 >60 mL/min Final   GFR calc non Af Amer  Date Value Ref Range Status  12/10/2018 55 (L) >60 mL/min Final   GFR  Date Value Ref Range Status  09/07/2018 86.68 >60.00 mL/min Final         Passed - Valid encounter within last 6 months    Recent Outpatient Visits          1 month ago Alcoholic cirrhosis of liver without ascites ([place])   [place] at [facility], Rayford Halsted, MD   1 month ago Closed fracture of one rib of left side, 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

## 2019-02-05 NOTE — Telephone Encounter (Signed)
Medication Refill - Medication: oxyCODONE (ROXICODONE) 5 MG immediate release tablet/metFORMIN (GLUCOPHAGE) 1000 MG tablet Pt stated she is now having pain on her left side and is requesting oxycodone for the pain. Also refill for metformin.  Has the patient contacted their pharmacy? No. (Agent: If no, request that the patient contact the pharmacy for the refill.) (Agent: If yes, when and what did the pharmacy advise?)  Preferred Pharmacy (with phone number or street name):  [id number] [address], [place] [phone] (Phone) [phone] (Fax)     Agent: Please be advised that RX refills may take up to 3 business days. We ask that you follow-up with your pharmacy.

## 2019-02-06 ENCOUNTER — Emergency Department (HOSPITAL_COMMUNITY)
Admission: EM | Admit: 2019-02-06 | Discharge: 2019-02-06 | Disposition: A | Discharge status: Home / Self Care | Payer: Medicare Other | Attending: Emergency Medicine | Admitting: Emergency Medicine

## 2019-02-06 ENCOUNTER — Emergency Department (HOSPITAL_COMMUNITY): Payer: Medicare Other

## 2019-02-06 ENCOUNTER — Encounter (HOSPITAL_COMMUNITY): Payer: Self-pay

## 2019-02-06 ENCOUNTER — Other Ambulatory Visit: Payer: Self-pay

## 2019-02-06 DIAGNOSIS — K7460 Unspecified cirrhosis of liver: Secondary | ICD-10-CM | POA: Diagnosis not present

## 2019-02-06 DIAGNOSIS — R1011 Right upper quadrant pain: Secondary | ICD-10-CM | POA: Diagnosis not present

## 2019-02-06 DIAGNOSIS — M545 Low back pain, unspecified: Secondary | ICD-10-CM

## 2019-02-06 DIAGNOSIS — E119 Type 2 diabetes mellitus without complications: Secondary | ICD-10-CM | POA: Insufficient documentation

## 2019-02-06 DIAGNOSIS — Z79899 Other long term (current) drug therapy: Secondary | ICD-10-CM | POA: Insufficient documentation

## 2019-02-06 DIAGNOSIS — M62830 Muscle spasm of back: Secondary | ICD-10-CM | POA: Diagnosis not present

## 2019-02-06 DIAGNOSIS — I10 Essential (primary) hypertension: Secondary | ICD-10-CM | POA: Diagnosis not present

## 2019-02-06 DIAGNOSIS — Z7984 Long term (current) use of oral hypoglycemic drugs: Secondary | ICD-10-CM | POA: Insufficient documentation

## 2019-02-06 DIAGNOSIS — R918 Other nonspecific abnormal finding of lung field: Secondary | ICD-10-CM | POA: Diagnosis not present

## 2019-02-06 DIAGNOSIS — Z7982 Long term (current) use of aspirin: Secondary | ICD-10-CM | POA: Insufficient documentation

## 2019-02-06 LAB — CBC WITH DIFFERENTIAL/PLATELET
Abs Immature Granulocytes: 0.02 10*3/uL (ref 0.00–0.07)
Basophils Absolute: 0 10*3/uL (ref 0.0–0.1)
Basophils Relative: 0 %
Eosinophils Absolute: 0 10*3/uL (ref 0.0–0.5)
Eosinophils Relative: 0 %
HCT: 39.7 % (ref 36.0–46.0)
Hemoglobin: 12.5 g/dL (ref 12.0–15.0)
Immature Granulocytes: 0 %
Lymphocytes Relative: 30 %
Lymphs Abs: 2.1 10*3/uL (ref 0.7–4.0)
MCH: 24.1 pg — ABNORMAL LOW (ref 26.0–34.0)
MCHC: 31.5 g/dL (ref 30.0–36.0)
MCV: 76.6 fL — ABNORMAL LOW (ref 80.0–100.0)
Monocytes Absolute: 0.6 10*3/uL (ref 0.1–1.0)
Monocytes Relative: 9 %
Neutro Abs: 4.2 10*3/uL (ref 1.7–7.7)
Neutrophils Relative %: 61 %
Platelets: 132 10*3/uL — ABNORMAL LOW (ref 150–400)
RBC: 5.18 MIL/uL — ABNORMAL HIGH (ref 3.87–5.11)
RDW: 15.6 % — ABNORMAL HIGH (ref 11.5–15.5)
WBC: 7 10*3/uL (ref 4.0–10.5)
nRBC: 0.3 % — ABNORMAL HIGH (ref 0.0–0.2)

## 2019-02-06 LAB — COMPREHENSIVE METABOLIC PANEL
ALT: 47 U/L — ABNORMAL HIGH (ref 0–44)
AST: 77 U/L — ABNORMAL HIGH (ref 15–41)
Albumin: 4 g/dL (ref 3.5–5.0)
Alkaline Phosphatase: 75 U/L (ref 38–126)
Anion gap: 13 (ref 5–15)
BUN: 15 mg/dL (ref 8–23)
CO2: 23 mmol/L (ref 22–32)
Calcium: 9.7 mg/dL (ref 8.9–10.3)
Chloride: 104 mmol/L (ref 98–111)
Creatinine, Ser: 1 mg/dL (ref 0.44–1.00)
GFR calc Af Amer: >60 mL/min (ref >60)
GFR calc non Af Amer: 58 mL/min — ABNORMAL LOW (ref >60)
Glucose, Bld: 106 mg/dL — ABNORMAL HIGH (ref 70–99)
Potassium: 4.6 mmol/L (ref 3.5–5.1)
Sodium: 140 mmol/L (ref 135–145)
Total Bilirubin: 1.7 mg/dL — ABNORMAL HIGH (ref 0.3–1.2)
Total Protein: 7.8 g/dL (ref 6.5–8.1)

## 2019-02-06 LAB — LACTIC ACID, PLASMA: Lactic Acid, Venous: 1.3 mmol/L (ref 0.5–1.9)

## 2019-02-06 LAB — LIPASE, BLOOD: Lipase: 25 U/L (ref 11–51)

## 2019-02-06 MED ORDER — METFORMIN HCL 1000 MG PO TABS: 500.0000 mg | ORAL_TABLET | Freq: Every day | ORAL | 1 refills | Status: DC

## 2019-02-06 MED ORDER — CYCLOBENZAPRINE HCL 10 MG PO TABS: 10.0000 mg | ORAL_TABLET | Freq: Two times a day (BID) | ORAL | 0 refills | Status: DC | PRN

## 2019-02-06 MED ORDER — MORPHINE SULFATE (PF) 4 MG/ML IV SOLN
4.0000 mg | Freq: Once | INTRAVENOUS | Status: AC
  Administered 2019-02-06: 4 mg via INTRAVENOUS
  Filled 2019-02-06: qty 1

## 2019-02-06 MED ORDER — CYCLOBENZAPRINE HCL 10 MG PO TABS
10.0000 mg | ORAL_TABLET | Freq: Once | ORAL | Status: AC
  Administered 2019-02-06: 10 mg via ORAL
  Filled 2019-02-06: qty 1

## 2019-02-06 NOTE — Discharge Instructions (Signed)
Your history and exam today are consistent with muscle spasms on the back causing her pain.  I do not see rash which would suggest shingles.  You had spasms that we felt.  Please use the muscle relaxant to help with the symptoms.  Your ultrasound not show cholecystitis and your labs were otherwise reassuring.  We ordered a urine to screen for kidney stone however you did not want to wait for this or subsequent imaging.  If any symptoms change or worsen and the muscle medicine does not help, please return to nearest emergency department as you may need further work-up to rule out kidney stone or kidney infection.

## 2019-02-06 NOTE — ED Provider Notes (Signed)
[facility] EMERGENCY DEPARTMENT Provider Note   CSN: [id number] Arrival date & time: 02/06/19  1056     History   Chief Complaint Chief Complaint  Patient presents with   Back Pain    HPI Deborah Wise is a 68 y.o. female.     The history is provided by the patient and medical records. No language interpreter was used.  Back Pain Location:  Lumbar spine Quality:  Cramping Radiates to: abdomen. Pain severity:  Severe Onset quality:  Sudden Duration:  5 days Timing:  Constant Progression:  Waxing and waning Chronicity:  New Relieved by:  Nothing Worsened by:  Twisting and touching Ineffective treatments:  None tried Associated symptoms: abdominal pain   Associated symptoms: no chest pain, no dysuria, no fever, no headaches, no leg pain, no numbness, no tingling and no weakness     Past Medical History:  Diagnosis Date   Anemia    Arthritis    Blood transfusion without reported diagnosis    Carotid artery occlusion    Cirrhosis ([place])    Diabetes mellitus without complication (HCC)    Fibroid, uterine    GERD (gastroesophageal reflux disease)    Hepatitis    hep C   Hypertension    Stroke ([place])    Thrombocytopenia ([place]) 12/25/2014   Urinary incontinence    Patient noticed mild and is not currently a significant problem    Patient Active Problem List   Diagnosis Date Noted   Cirrhosis of liver ([place]) 06/22/2018   Chronic hepatitis ([place]) 06/22/2018   Hyperlipidemia associated with type 2 diabetes mellitus ([place]) 06/22/2018   Carotid artery disease (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   Aneurysm of middle cerebral artery    Cerebrovascular accident (CVA) due to thrombosis of left vertebral artery ([place]) 06/28/2016   Cocaine abuse ([place])    Stroke (cerebrum) (HCC) 06/27/2016   Microcytic anemia 12/27/2014   Thrombocytopenia ([place]) 12/25/2014   Hypoglycemia 07/21/2013    Diabetes mellitus ([place]) 07/21/2013   Malignant hypertension 07/21/2013   Liver disease 07/21/2013   Alcohol abuse 07/21/2013    Past Surgical History:  Procedure Laterality Date   ANKLE FRACTURE SURGERY Right 1999   x 2, rod   COLONOSCOPY     IR GENERIC HISTORICAL  06/30/2016   IR ANGIO VERTEBRAL SEL SUBCLAVIAN INNOMINATE UNI R MOD SED 06/30/2016 [name], MD [facility]   IR GENERIC HISTORICAL  06/30/2016   IR ANGIO VERTEBRAL SEL VERTEBRAL UNI L MOD SED 06/30/2016 [name], MD [facility]   IR GENERIC HISTORICAL  06/30/2016   IR ANGIO INTRA EXTRACRAN SEL COM CAROTID INNOMINATE BILAT MOD SED 06/30/2016 [name], MD [facility]   MULTIPLE EXTRACTIONS WITH ALVEOLOPLASTY  10/25/2011   Procedure: MULTIPLE EXTRACION WITH ALVEOLOPLASTY;  Surgeon: Gae Bon, DDS;  Location: [facility];  Service: Oral Surgery;  Laterality: Bilateral;  Extract teeth numbers one, two, six, seven, eight, nine, ten, eleven, twelve, fifteen, sixteen, eighteen, nineteen, twenty-three, twenty-four, twenty-five, twenty-seven, thirty-two and alveoplasty.     OB History    Gravida  5   Para  3   Term  3   Preterm      AB  2   Living  3     SAB  1   TAB  1   Ectopic      Multiple      Live Births  Home Medications    Prior to Admission medications   Medication Sig Start Date End Date Taking? Authorizing Provider  Accu-Chek FastClix Lancets MISC 1 each by Does not apply route daily. 01/11/19   Isaac Bliss, Rayford Halsted, MD  ACCU-CHEK SOFTCLIX LANCETS lancets Accu-Chek Softclix Lancets  USE AS DIRECTED TO CHECK BLOOD SUGAR TWICE A DAY    [provider]  amLODipine (NORVASC) 10 MG tablet Take 1 tablet (10 mg total) by mouth daily. 01/17/19   Isaac Bliss, Rayford Halsted, MD  aspirin EC 81 MG tablet Take 1 tablet (81 mg total) by mouth daily. 06/22/18   Isaac Bliss, Rayford Halsted, MD  atorvastatin (LIPITOR) 80 MG tablet Take 1 tablet (80 mg  total) by mouth daily. 09/07/18   Isaac Bliss, Rayford Halsted, MD  Blood Glucose Monitoring Suppl (ACCU-CHEK GUIDE) w/Device KIT Please specify directions, refills and quantity 07/04/18   Isaac Bliss, Rayford Halsted, MD  diclofenac (VOLTAREN) 50 MG EC tablet Take 1 tablet (50 mg total) by mouth 2 (two) times daily. 08/29/18   Nat Christen, MD  glucose blood test strip 1 each by Other route daily. Dx E11.9 E11.69 06/30/18   Isaac Bliss, Rayford Halsted, MD  hydrALAZINE (APRESOLINE) 25 MG tablet Take 1 tablet (25 mg total) by mouth 3 (three) times daily. 01/17/19   Isaac Bliss, Rayford Halsted, MD  hydrochlorothiazide (HYDRODIURIL) 25 MG tablet  10/29/18   [provider]  lisinopril (PRINIVIL,ZESTRIL) 40 MG tablet Take 1 tablet (40 mg total) by mouth daily. 09/07/18   Isaac Bliss, Rayford Halsted, MD  meloxicam (MOBIC) 7.5 MG tablet TAKE 1 TABLET BY MOUTH EVERY DAY 01/23/19   Isaac Bliss, Rayford Halsted, MD  metFORMIN (GLUCOPHAGE) 1000 MG tablet Take 0.5 tablets (500 mg total) by mouth daily. 02/06/19   Isaac Bliss, Rayford Halsted, MD  oxyCODONE (ROXICODONE) 5 MG immediate release tablet Take 1 tablet (5 mg total) by mouth every 6 (six) hours as needed for severe pain. 12/12/18   Burchette, Alinda Sierras, MD  predniSONE (STERAPRED UNI-PAK 21 TAB) 10 MG (21) TBPK tablet Take as directed 09/22/18   Isaac Bliss, Rayford Halsted, MD  Tar Heel 355 MG/ML SOAJ INJECT 140 MG INTO THE SKIN EVERY 75 (FOURTEEN) DAYS. 12/06/18   Garvin Fila, MD    Family History Family History  Problem Relation Age of Onset   Diabetes type II Mother    Hypertension Mother    Stroke Mother    Cancer Father        patient does not know what type of cancer   Diabetes Daughter    Breast cancer Neg Hx    Colon polyps Neg Hx    Crohn's disease Neg Hx    Esophageal cancer Neg Hx    Rectal cancer Neg Hx    Stomach cancer Neg Hx    Colon cancer Neg Hx     Social History Social History   Tobacco Use   Smoking status:  Never Smoker   Smokeless tobacco: Never Used  Substance Use Topics   Alcohol use: Yes    Alcohol/week: 1.0 standard drinks    Types: 1 Cans of beer per week    Comment: 1/2 bottle of wine and some beer a day   Drug use: Not Currently    Frequency: 1.0 times per week    Types: Cocaine    Comment: last use cocaine last year 2018     Allergies   Patient has no known allergies.   Review of  Systems Review of Systems  Constitutional: Negative for chills, diaphoresis, fatigue and fever.  HENT: Negative for congestion.   Respiratory: Negative for cough, chest tightness and wheezing.   Cardiovascular: Negative for chest pain, palpitations and leg swelling.  Gastrointestinal: Positive for abdominal pain. Negative for constipation, diarrhea, nausea and vomiting.  Genitourinary: Positive for flank pain. Negative for dysuria and frequency.  Musculoskeletal: Positive for back pain. Negative for neck pain and neck stiffness.  Neurological: Negative for tingling, weakness, light-headedness, numbness and headaches.  Psychiatric/Behavioral: Negative for agitation.  All other systems reviewed and are negative.    Physical Exam Updated Vital Signs BP (!) 186/83 (BP Location: Left Arm)    Pulse (!) 112    Temp 98.7 F (37.1 C) (Oral)    Resp 20    SpO2 100%   Physical Exam Vitals signs and nursing note reviewed.  Constitutional:      General: She is not in acute distress.    Appearance: She is well-developed. She is not ill-appearing, toxic-appearing or diaphoretic.  HENT:     Head: Normocephalic and atraumatic.     Right Ear: External ear normal.     Left Ear: External ear normal.     Nose: Nose normal.     Mouth/Throat:     Pharynx: No oropharyngeal exudate.  Eyes:     Conjunctiva/sclera: Conjunctivae normal.     Pupils: Pupils are equal, round, and reactive to light.  Neck:     Musculoskeletal: Normal range of motion and neck supple.  Cardiovascular:     Rate and Rhythm:  Normal rate.     Pulses: Normal pulses.     Heart sounds: No murmur.  Pulmonary:     Effort: No respiratory distress.     Breath sounds: No stridor. No wheezing, rhonchi or rales.  Chest:     Chest wall: No tenderness.  Abdominal:     General: Abdomen is flat. There is no distension.     Tenderness: There is no abdominal tenderness. There is no rebound.  Musculoskeletal:        General: Tenderness present.     Thoracic back: She exhibits tenderness, pain and spasm.       Back:     Right lower leg: No edema.     Left lower leg: No edema.  Skin:    General: Skin is warm.     Findings: No erythema or rash.  Neurological:     General: No focal deficit present.     Mental Status: She is alert and oriented to person, place, and time.     Motor: No abnormal muscle tone.     Coordination: Coordination normal.     Deep Tendon Reflexes: Reflexes are normal and symmetric.  Psychiatric:        Mood and Affect: Mood normal.      ED Treatments / Results  Labs (all labs ordered are listed, but only abnormal results are displayed) Labs Reviewed  CBC WITH DIFFERENTIAL/PLATELET - Abnormal; Notable for the following components:      Result Value   RBC 5.18 (*)    MCV 76.6 (*)    MCH 24.1 (*)    RDW 15.6 (*)    Platelets 132 (*)    nRBC 0.3 (*)    All other components within normal limits  COMPREHENSIVE METABOLIC PANEL - Abnormal; Notable for the following components:   Glucose, Bld 106 (*)    AST 77 (*)    ALT 47 (*)  Total Bilirubin 1.7 (*)    GFR calc non Af Amer 58 (*)    All other components within normal limits  URINE CULTURE  LACTIC ACID, PLASMA  LIPASE, BLOOD  URINALYSIS, ROUTINE W REFLEX MICROSCOPIC    EKG None  Radiology Dg Chest Portable 1 View  Result Date: 02/06/2019 CLINICAL DATA:  RUQ pain x5 days. Hx of DM, HTN, Hepatitis, cirrhosis, stroke. Nonsmoker. EXAM: PORTABLE CHEST 1 VIEW COMPARISON:  Chest radiographs dated 12/10/2018, 10/19/2011 FINDINGS: Stable  cardiomediastinal contours with enlarged heart size. Low lung volumes. There are bibasilar streaky linear opacities similar in appearance to the prior study favored to represent subsegmental atelectasis. No new definite focal infiltrate. No pneumothorax or large pleural effusion. Visualized portions of the skeleton are unremarkable. IMPRESSION: Streaky bibasilar opacities favored to represent subsegmental atelectasis. Electronically Signed   By: Audie Pinto M.D.   On: 02/06/2019 13:19   US Abdomen Limited Ruq  Result Date: 02/06/2019 CLINICAL DATA:  Right upper quadrant abdominal pain EXAM: ULTRASOUND ABDOMEN LIMITED RIGHT UPPER QUADRANT COMPARISON:  CT 12/10/2018.  Ultrasound 04/12/2018 FINDINGS: Gallbladder: No gallstones or wall thickening visualized. No sonographic Murphy sign noted by sonographer. Common bile duct: Diameter: 6 mm Liver: No focal lesion identified. Subtly nodular hepatic contour. Mild diffuse increased hepatic parenchymal echogenicity and coarsened echotexture. Portal vein is patent on color Doppler imaging with normal direction of blood flow towards the liver. Other: None. IMPRESSION: 1. No acute sonographic findings within the right upper quadrant. 2. Cirrhotic morphology of the liver. No focal liver lesion identified. Electronically Signed   By: Davina Poke M.D.   On: 02/06/2019 14:44    Procedures Procedures (including critical care time)  Medications Ordered in ED Medications  cyclobenzaprine (FLEXERIL) tablet 10 mg (has no administration in time range)  morphine 4 MG/ML injection 4 mg (4 mg Intravenous Given 02/06/19 1401)     Initial Impression / Assessment and Plan / ED Course  I have reviewed the triage vital signs and the nursing notes.  Pertinent labs & imaging results that were available during my care of the patient were reviewed by me and considered in my medical decision making (see chart for details).        Debi L Foote is a 68 y.o. female  with a past medical history significant for prior stroke, hepatitis, cirrhosis, diabetes, and hypertension who presents with right\right flank pain and right upper abdominal quadrant abdominal pain.  Patient reports that for the last 5 days, she is had severe pain in her right back, right flank, and right abdomen.  She reports it started suddenly when she stood up and twisted.  She denies any nausea, vomiting, urinary symptoms or GI symptoms.  She reports the pain is up to 10 out of 10 in severity.  She reports her back feels swollen and may be spasming.  She is never had this pain before.  She denies history of gallbladder troubles.  She denies any other trauma.  She reports he took Tylenol that did not help her symptoms.  On exam, patient has exquisite tenderness in her right upper quadrant and right flank.  Patient has tenderness in the right CVA area.  Patient has muscle spasm palpated on the superficial back on the right.  Left side unremarkable.  Lungs clear and chest is nontender.  Good pulses in lower extremities.  Clinical aspect patient pulled her back and has muscle spasms in the right paraspinal back wrapping around her torso however, will get labs given  the tenderness in the right upper quadrant.  Will get right upper quadrant ultrasound to assess for acute cholecystitis given her known liver problems.  We will also get urinalysis that might suggest a kidney stone.  If blood is seen and symptoms do not improve, would consider CT stone study.  Lower suspicion for a retroperitoneal hematoma or other abnormality like this.  Also low suspicion for a dissection or other cause of severe back pain given the lateral nature of her symptoms and her lack of lower extremity symptoms.  Patient will be given pain medicine and work-up.  Anticipate reassessment after work-up.  3:56 PM Patient's initial labs are reassuring.  AST and ALT are similar to prior values.  Normal kidney function.  CBC shows no  leukocytosis and no anemia.  Platelets are always low.  Lipase not elevated.  Lactic acid normal.  Ultrasound showed no acute cholecystitis and showed her known liver problems.  Chest x-ray shows no pneumonia.  I went to reassess the patient and she does not want to wait for urinalysis to look for infection or kidney stone.  She would rather get prescription for medication to help likely muscle spasms which is what I think is the likely etiology of her symptoms.  We felt muscle spasms on her exam.  Patient was given a dose of Flexeril as she has a ride for herself and will print prescription.  Patient understands return precautions if she has new or worsening symptoms or any urinary symptoms.  She had no questions or concerns and was discharged in good condition.   Final Clinical Impressions(s) / ED Diagnoses   Final diagnoses:  RUQ abdominal pain  Acute right-sided low back pain without sciatica  Muscle spasm of back    ED Discharge Orders         Ordered    cyclobenzaprine (FLEXERIL) 10 MG tablet  2 times daily PRN     02/06/19 1558          Clinical Impression: 1. Acute right-sided low back pain without sciatica   2. RUQ abdominal pain   3. Muscle spasm of back     Disposition: Discharge  Condition: Good  I have discussed the results, Dx and Tx plan with the pt(& family if present). He/she/they expressed understanding and agree(s) with the plan. Discharge instructions discussed at great length. Strict return precautions discussed and pt &/or family have verbalized understanding of the instructions. No further questions at time of discharge.    New Prescriptions   CYCLOBENZAPRINE (FLEXERIL) 10 MG TABLET    Take 1 tablet (10 mg total) by mouth 2 (two) times daily as needed for muscle spasms.    Follow Up: No follow-up provider specified.    Brittane Grudzinski, Gwenyth Allegra, MD 02/06/19 [phone]

## 2019-02-06 NOTE — ED Triage Notes (Signed)
Pt reports Friday she stood up and turned quickly and something happened to her right side and right lower back. Pt reports she is swollen from her underarm down her right side to her hip. Pt reports pain is worse with movement and coughing.

## 2019-02-06 NOTE — ED Notes (Signed)
Pt given dc instructions pt verbalizes understanding.  

## 2019-02-14 ENCOUNTER — Encounter: Payer: Self-pay | Admitting: Neurology

## 2019-02-14 ENCOUNTER — Ambulatory Visit (INDEPENDENT_AMBULATORY_CARE_PROVIDER_SITE_OTHER): Payer: Medicare Other | Admitting: Neurology

## 2019-02-14 ENCOUNTER — Other Ambulatory Visit: Payer: Self-pay

## 2019-02-14 VITALS — BP 173/86 | HR 65 | Temp 97.7°F | Wt 179.0 lb

## 2019-02-14 DIAGNOSIS — E7849 Other hyperlipidemia: Secondary | ICD-10-CM | POA: Diagnosis not present

## 2019-02-14 DIAGNOSIS — G5731 Lesion of lateral popliteal nerve, right lower limb: Secondary | ICD-10-CM

## 2019-02-14 NOTE — Progress Notes (Signed)
[facility] [address] [place]. [place] [address] [phone]       OFFICE CONSULT NOTE  Ms. GRACELYNNE BENEDICT Date of Birth:  1950/09/08 Medical Record Number:  [id number]   Referring MD: Dr. Jerilee Hoh  Reason for Referral: Falls and balance issues HPI: Ms. Marich is a 68 year old African-American lady seen today for office consultation visit for frequent falls.  History is obtained from the patient, review of electronic medical records and have personally reviewed imaging films where available. She states that she has had a couple of falls since January.  She states the falls are minor they were all unprovoked.  She had no time to catch herself.  She did not lose consciousness.  She bruised her knee.  She was unable to get up without assistance.  She denies any significant pain in the back, radicular pain or pain in the feet.  She does have chronic numbness in her right foot.  She is has remote history of stroke in January 2018 when she had a right paramedian pontine lacunar infarct.  She did have some left leg incoordination but she did improve.  She denies any previous back surgeries.  She denies any lack of bowel or bladder control.  She does admit that she cannot walk far and has to sit down because her legs hurt.  She has not had any follow-up neurovascular studies done since her stroke.  She did have a cerebral catheter angiogram on 06/30/2016 by Dr. 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  She was subsequently followed at vascular surgery clinic and last carotid ultrasound on 05/26/2018 had shown stable 60 to 79% right ICA stenosis.  She has not had any recurrent stroke or TIA symptoms.  She states she is on aspirin and tolerating it well without bleeding or bruising.  Her blood pressure usually is better controlled but today it is elevated in office at 156/85.   She is unable to tell me when she had her last lipid profile or hemoglobin A1c checked.  She does have diabetes and states that her control is adequate.  She has had some minor paresthesias in the feet and does not feel that this is getting worse. Update 02/14/2019 ; she returns for follow-up after last visit 6 months ago.  Patient states about a week or 10 days ago she twisted her back when she stood up and had sudden onset of severe muscle tightness in cramp-like pain in the right posterior back extending to the flank and upper abdomen.  She in fact was seen in the ER on 02/06/2019 for this and thought to have a muscle spasm.  She had an ultrasound of the right upper quadrant which showed no evidence of cholecystitis or any acute hepatic abnormality.  Chest x-ray showed no evidence of pneumonia.  Patient has been started on muscle relaxant Flexeril as well as Mobic and tramadol as needed.  She states her medicines are not obtaining substantial relief.  She did undergo EMG nerve conduction study by Dr. 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  No evidence of generalized peripheral neuropathy or radiculopathy was found.  Patient denies habitual squatting or recurrent injuries to her right knee.  She however does admit that she sleeps on her side mostly on the right side.  She also had  a follow-up carotid ultrasound in 11/06/2018 which showed moderate for 40 to 59% right ICA and 1-39% left ICA stenosis.  Lipid profile on 08/02/2018 showed LDL cholesterol of 125 and hemoglobin A1c of 5.8.  Patient was started on Repatha injections 140 mg every 2 weeks and has been tolerating it well without injection site reactions or other side effects.  She however remains on Lipitor 80 mg as well.  She has not had any follow-up lipid profile checked.   ROS:   14 system review of systems is positive for back pain, flank pain, muscle spasms, muscle tightness, fall and all  other systems negative decreased energy and all other systems negative  PMH:  Past Medical History:  Diagnosis Date   Anemia    Arthritis    Blood transfusion without reported diagnosis    Carotid artery occlusion    Cirrhosis ([place])    Diabetes mellitus without complication (HCC)    Fibroid, uterine    GERD (gastroesophageal reflux disease)    Hepatitis    hep C   Hypertension    Stroke (HCC)    Thrombocytopenia ([place]) 12/25/2014   Urinary incontinence    Patient noticed mild and is not currently a significant problem    Social History:  Social History   Socioeconomic History   Marital status: Legally Separated    Spouse name: Not on file   Number of children: 3   Years of education: Not on file   Highest education level: Not on file  Occupational History   Occupation: disabled  Social Designer, fashion/clothing strain: Not on file   Food insecurity    Worry: Not on file    Inability: Not on file   Transportation needs    Medical: Not on file    Non-medical: Not on file  Tobacco Use   Smoking status: Never Smoker   Smokeless tobacco: Never Used  Substance and Sexual Activity   Alcohol use: Yes    Alcohol/week: 1.0 standard drinks    Types: 1 Cans of beer per week    Comment: 1/2 bottle of wine and some beer a day   Drug use: Not Currently    Frequency: 1.0 times per week    Types: Cocaine    Comment: last use cocaine last year 2018   Sexual activity: Not Currently  Lifestyle   Physical activity    Days per week: Not on file    Minutes per session: Not on file   Stress: Not on file  Relationships   Social connections    Talks on phone: Not on file    Gets together: Not on file    Attends religious service: Not on file    Active member of club or organization: Not on file    Attends meetings of clubs or organizations: Not on file    Relationship status: Not on file   Intimate partner violence    Fear of current or ex  partner: Not on file    Emotionally abused: Not on file    Physically abused: Not on file    Forced sexual activity: Not on file  Other Topics Concern   Not on file  Social History Narrative   Not on file    Medications:   Current Outpatient Medications on File Prior to Visit  Medication Sig Dispense Refill   Accu-Chek FastClix Lancets MISC 1 each by Does not apply route daily. 100 each 3   ACCU-CHEK SOFTCLIX LANCETS lancets  Accu-Chek Softclix Lancets  USE AS DIRECTED TO CHECK BLOOD SUGAR TWICE A DAY     amLODipine (NORVASC) 10 MG tablet Take 1 tablet (10 mg total) by mouth daily. 90 tablet 1   aspirin EC 81 MG tablet Take 1 tablet (81 mg total) by mouth daily. 31 tablet 11   atorvastatin (LIPITOR) 80 MG tablet Take 1 tablet (80 mg total) by mouth daily. 90 tablet 3   Blood Glucose Monitoring Suppl (ACCU-CHEK GUIDE) w/Device KIT Please specify directions, refills and quantity 1 kit 0   cyclobenzaprine (FLEXERIL) 10 MG tablet Take 1 tablet (10 mg total) by mouth 2 (two) times daily as needed for muscle spasms. 20 tablet 0   diclofenac (VOLTAREN) 50 MG EC tablet Take 1 tablet (50 mg total) by mouth 2 (two) times daily. 20 tablet 0   glucose blood test strip 1 each by Other route daily. Dx E11.9 E11.69 100 each 3   hydrALAZINE (APRESOLINE) 25 MG tablet Take 1 tablet (25 mg total) by mouth 3 (three) times daily. 270 tablet 1   hydrochlorothiazide (HYDRODIURIL) 25 MG tablet      lisinopril (PRINIVIL,ZESTRIL) 40 MG tablet Take 1 tablet (40 mg total) by mouth daily. 90 tablet 3   meloxicam (MOBIC) 7.5 MG tablet TAKE 1 TABLET BY MOUTH EVERY DAY 30 tablet 1   metFORMIN (GLUCOPHAGE) 1000 MG tablet Take 0.5 tablets (500 mg total) by mouth daily. 180 tablet 1   oxyCODONE (ROXICODONE) 5 MG immediate release tablet Take 1 tablet (5 mg total) by mouth every 6 (six) hours as needed for severe pain. 20 tablet 0   predniSONE (STERAPRED UNI-PAK 21 TAB) 10 MG (21) TBPK tablet Take as  directed 21 tablet 0   REPATHA SURECLICK 606 MG/ML SOAJ INJECT 140 MG INTO THE SKIN EVERY 14 (FOURTEEN) DAYS. 2 pen 3   traMADol (ULTRAM) 50 MG tablet TAKE 1 TABLET BY MOUTH TWICE A DAY FOR 5 DAYS     No current facility-administered medications on file prior to visit.     Allergies:  No Known Allergies  Physical Exam General: Pleasant middle-aged African-American lady, seated, in no evident distress Head: head normocephalic and atraumatic.   Neck: supple with no carotid or supraclavicular bruits Cardiovascular: regular rate and rhythm, no murmurs Musculoskeletal: no deformity Skin:  no rash/petichiae Vascular:  Normal pulses all extremities  Neurologic Exam Mental Status: Awake and fully alert. Oriented to place and time. Recent and remote memory intact. Attention span, concentration and fund of knowledge appropriate. Mood and affect appropriate.  Cranial Nerves: Fundoscopic exam reveals sharp disc margins. Pupils equal, briskly reactive to light. Extraocular movements full without nystagmus. Visual fields full to confrontation. Hearing intact. Facial sensation intact. Face, tongue, palate moves normally and symmetrically.  Motor: Normal bulk and tone. Normal strength in all tested extremity muscles. Sensory.: intact to touch , pinprick , position but diminished vibratory sensation over toes bilaterally.  Coordination: Rapid alternating movements normal in all extremities. Finger-to-nose and heel-to-shin performed accurately bilaterally. Gait and Station: Arises from chair without difficulty. Stance is normal. Gait demonstrates normal stride length and balance . Able to heel, toe and tandem walk with moderate t difficulty.  Reflexes: 1+ and symmetric except ankle jerks are diminished. Toes downgoing.      ASSESSMENT: 68 year old African-American lady with recent episodes of falls likely multifactorial due to combination of underlying right peroneal neuropathy as well as prior right  pontine stroke in January 2018 and asymptomatic moderate right carotid stenosis.   [name] Kitchen  Vascular risk factors of diabetes, hypertension, hyperlipidemia, stroke and carotid stenosis.  New onset right paraspinal muscle spasm and pain likely muscular in origin.     PLAN: I had a long discussion with the patient with regards to her right peroneal neuropathy at the knee which is likely due to habitual sleeping on the right side.  I recommend she use a pillow at night beneath her right knee while sleeping.  Continue aspirin for stroke prevention with strict control of hypertension with blood pressure goal below 130/90, lipids with LDL cholesterol goal below 70 mg percent and diabetes with hemoglobin A1c goal below 6.5%.  Continue yearly carotid ultrasound studies to follow her moderate right carotid stenosis.  I recommend she follow-up with her primary care physician for persistent right upper quadrant abdominal pain.  Continue muscle relaxants and nonsteroidal anti-inflammatory drugs for her muscular back and right leg pain.  She will return for follow-up in 6 months with my nurse practitioner Janett Billow or call earlier if necessary. Greater than 50% time during this 25-minute  visit were spent on counseling and coordination of care about her falls and remote stroke and perobneal neuropathy and answering questions  Antony Contras, MD  [facility] [address] [place], [place] [address]  Phone [phone] Fax [phone] Note: This document was prepared with digital dictation and possible smart phrase technology. Any transcriptional errors that result from this process are unintentional.

## 2019-02-14 NOTE — Patient Instructions (Signed)
I had a long discussion with the patient with regards to her right peroneal neuropathy at the knee which is likely due to habitual sleeping on the right side.  I recommend she use a pillow at night beneath her right knee while sleeping.  Continue aspirin for stroke prevention with strict control of hypertension with blood pressure goal below 130/90, lipids with LDL cholesterol goal below 70 mg percent and diabetes with hemoglobin A1c goal below 6.5%.  Continue yearly carotid ultrasound studies to follow her moderate right carotid stenosis.  I recommend she follow-up with her primary care physician for persistent right upper quadrant abdominal pain.  Continue muscle relaxants and nonsteroidal anti-inflammatory drugs for her muscular back and right leg pain.  She will return for follow-up in 6 months with my nurse practitioner Janett Billow or call earlier if necessary.

## 2019-02-15 LAB — LIPID PANEL
Chol/HDL Ratio: 2.5 ratio (ref 0.0–4.4)
Cholesterol, Total: 122 mg/dL (ref 100–199)
HDL: 49 mg/dL (ref >39)
LDL Chol Calc (NIH): 59 mg/dL (ref 0–99)
Triglycerides: 67 mg/dL (ref 0–149)
VLDL Cholesterol Cal: 14 mg/dL (ref 5–40)

## 2019-02-15 LAB — HEPATIC FUNCTION PANEL
ALT: 32 IU/L (ref 0–32)
AST: 38 IU/L (ref 0–40)
Albumin: 4.4 g/dL (ref 3.8–4.8)
Alkaline Phosphatase: 100 IU/L (ref 39–117)
Bilirubin Total: 1.1 mg/dL (ref 0.0–1.2)
Bilirubin, Direct: 0.53 mg/dL — ABNORMAL HIGH (ref 0.00–0.40)
Total Protein: 7.1 g/dL (ref 6.0–8.5)

## 2019-02-20 ENCOUNTER — Telehealth: Payer: Self-pay

## 2019-02-20 NOTE — Telephone Encounter (Signed)
-----   Message from Garvin Fila, MD sent at 02/15/2019  1:20 PM EDT ----- Deborah Wise inform the patient that cholesterol profile and liver function tests are both within acceptable range.  Nothing to worry about

## 2019-02-20 NOTE — Telephone Encounter (Signed)
Notes recorded by Marval Regal, RN on 02/20/2019 at 8:21 AM EDT  I called patient that her cholesterol and liver function labs were within acceptable range. Pt verbalized understanding.

## 2019-02-27 ENCOUNTER — Other Ambulatory Visit: Payer: Self-pay | Admitting: Internal Medicine

## 2019-02-27 DIAGNOSIS — I10 Essential (primary) hypertension: Secondary | ICD-10-CM

## 2019-03-05 ENCOUNTER — Telehealth: Payer: Self-pay | Admitting: Internal Medicine

## 2019-03-05 NOTE — Telephone Encounter (Signed)
Patient called requesting a prescription/PA for diabetic shoes.  Patient states her insurance needs PCP to send PA.  Patient call back [phone]

## 2019-03-07 NOTE — Telephone Encounter (Signed)
Triamcinolone is not used for dry skin, it is for eczema, among other things. Does she need an OV to discuss?

## 2019-03-07 NOTE — Telephone Encounter (Signed)
Appointment scheduled.

## 2019-03-07 NOTE — Telephone Encounter (Signed)
Spoke with patient and she will get more information about obtaining DM shoes and call back.  Patient would like to know if she can have a prescription of triamcinolone 0.1% cream for dry skin?  Not currently on medication list.  [place]

## 2019-03-08 DIAGNOSIS — K7469 Other cirrhosis of liver: Secondary | ICD-10-CM | POA: Diagnosis not present

## 2019-03-08 NOTE — Telephone Encounter (Signed)
Patient called in stating that her insurance will cover the shoes and the number to call is [phone] for the PA. Please advise.

## 2019-03-08 NOTE — Telephone Encounter (Signed)
Please advise 

## 2019-03-08 NOTE — Telephone Encounter (Signed)
Will be discussed at upcoming appointment

## 2019-03-20 ENCOUNTER — Encounter: Payer: Self-pay | Admitting: Internal Medicine

## 2019-03-20 ENCOUNTER — Ambulatory Visit (INDEPENDENT_AMBULATORY_CARE_PROVIDER_SITE_OTHER): Payer: Medicare Other | Admitting: Internal Medicine

## 2019-03-20 ENCOUNTER — Other Ambulatory Visit: Payer: Self-pay

## 2019-03-20 VITALS — BP 150/90 | HR 90 | Temp 97.7°F | Wt 179.1 lb

## 2019-03-20 DIAGNOSIS — L2082 Flexural eczema: Secondary | ICD-10-CM | POA: Diagnosis not present

## 2019-03-20 DIAGNOSIS — I10 Essential (primary) hypertension: Secondary | ICD-10-CM | POA: Diagnosis not present

## 2019-03-20 DIAGNOSIS — E1149 Type 2 diabetes mellitus with other diabetic neurological complication: Secondary | ICD-10-CM

## 2019-03-20 MED ORDER — TRIAMCINOLONE ACETONIDE 0.1 % EX CREA: 1.0000 "application " | TOPICAL_CREAM | Freq: Two times a day (BID) | CUTANEOUS | 0 refills | Status: DC

## 2019-03-20 NOTE — Patient Instructions (Addendum)
-  Nice seeing you today!!  -RESUME lisinopril 40 mg daily for your blood pressure.  -Schedule follow up in 8 weeks for your blood pressure.

## 2019-03-20 NOTE — Progress Notes (Signed)
Established Patient Office Visit     CC/Reason for Visit: Diabetic shoes, wants triamcinolone cream  HPI: Deborah Wise is a 68 y.o. female who is coming in today for the above mentioned reasons. Past Medical History is significant for: CVA on January 2018, uncontrolled hypertension, well-controlled type 2 diabetes, hyperlipidemia, history of alcohol and polysubstance abuse including cocaine and marijuana.  History of carotid artery disease followed by vascular surgery.  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  Thrombocytopenia due to splenomegaly.  She is here for 2 reasons:  1. She would like a Rx for diabetic shoes. She was told by her insurance carrier that they would cover the cost.  2. She wants a refill of triamcinolone cream that she uses as needed in between her left 4th and 5th toes for what she has been told is eczema.  Otherwise no acute complaints.   Past Medical/Surgical History: Past Medical History:  Diagnosis Date  . Anemia   . Arthritis   . Blood transfusion without reported diagnosis   . Carotid artery occlusion   . Cirrhosis ([place])   . Diabetes mellitus without complication ([place])   . Fibroid, uterine   . GERD (gastroesophageal reflux disease)   . Hepatitis    hep C  . Hypertension   . Stroke ([place])   . Thrombocytopenia ([place]) 12/25/2014  . Urinary incontinence    Patient noticed mild and is not currently a significant problem    Past Surgical History:  Procedure Laterality Date  . ANKLE FRACTURE SURGERY Right 1999   x 2, rod  . COLONOSCOPY    . IR GENERIC HISTORICAL  06/30/2016   IR ANGIO VERTEBRAL SEL SUBCLAVIAN INNOMINATE UNI R MOD SED 06/30/2016 [name], MD [facility]  . IR GENERIC HISTORICAL  06/30/2016   IR ANGIO VERTEBRAL SEL VERTEBRAL UNI L MOD SED 06/30/2016 [name], MD [facility]  . IR GENERIC HISTORICAL  06/30/2016   IR ANGIO INTRA EXTRACRAN SEL COM CAROTID INNOMINATE BILAT MOD SED  06/30/2016 [name], MD [facility]  . MULTIPLE EXTRACTIONS WITH ALVEOLOPLASTY  10/25/2011   Procedure: MULTIPLE EXTRACION WITH ALVEOLOPLASTY;  Surgeon: Gae Bon, DDS;  Location: [facility];  Service: Oral Surgery;  Laterality: Bilateral;  Extract teeth numbers one, two, six, seven, eight, nine, ten, eleven, twelve, fifteen, sixteen, eighteen, nineteen, twenty-three, twenty-four, twenty-five, twenty-seven, thirty-two and alveoplasty.    Social History:  reports that she has never smoked. She has never used smokeless tobacco. She reports current alcohol use of about 1.0 standard drinks of alcohol per week. She reports previous drug use. Frequency: 1.00 time per week. Drug: Cocaine.  Allergies: No Known Allergies  Family History:  Family History  Problem Relation Age of Onset  . Diabetes type II Mother   . Hypertension Mother   . Stroke Mother   . Cancer Father        patient does not know what type of cancer  . Diabetes Daughter   . Breast cancer Neg Hx   . Colon polyps Neg Hx   . Crohn's disease Neg Hx   . Esophageal cancer Neg Hx   . Rectal cancer Neg Hx   . Stomach cancer Neg Hx   . Colon cancer Neg Hx      Current Outpatient Medications:  .  Accu-Chek FastClix Lancets MISC, 1 each by Does not apply route daily., Disp: 100 each, Rfl: 3 .  ACCU-CHEK SOFTCLIX LANCETS lancets, Accu-Chek  Softclix Lancets  USE AS DIRECTED TO CHECK BLOOD SUGAR TWICE A DAY, Disp: , Rfl:  .  amLODipine (NORVASC) 10 MG tablet, TAKE 1 TABLET BY MOUTH EVERY DAY, Disp: 90 tablet, Rfl: 1 .  aspirin EC 81 MG tablet, Take 1 tablet (81 mg total) by mouth daily., Disp: 31 tablet, Rfl: 11 .  atorvastatin (LIPITOR) 80 MG tablet, Take 1 tablet (80 mg total) by mouth daily., Disp: 90 tablet, Rfl: 3 .  Blood Glucose Monitoring Suppl (ACCU-CHEK GUIDE) w/Device KIT, Please specify directions, refills and quantity, Disp: 1 kit, Rfl: 0 .  cyclobenzaprine (FLEXERIL) 10 MG tablet, Take 1 tablet (10 mg total) by  mouth 2 (two) times daily as needed for muscle spasms., Disp: 20 tablet, Rfl: 0 .  diclofenac (VOLTAREN) 50 MG EC tablet, Take 1 tablet (50 mg total) by mouth 2 (two) times daily., Disp: 20 tablet, Rfl: 0 .  glucose blood test strip, 1 each by Other route daily. Dx E11.9 E11.69, Disp: 100 each, Rfl: 3 .  hydrALAZINE (APRESOLINE) 25 MG tablet, Take 1 tablet (25 mg total) by mouth 3 (three) times daily., Disp: 270 tablet, Rfl: 1 .  hydrochlorothiazide (HYDRODIURIL) 25 MG tablet, , Disp: , Rfl:  .  lisinopril (PRINIVIL,ZESTRIL) 40 MG tablet, Take 1 tablet (40 mg total) by mouth daily., Disp: 90 tablet, Rfl: 3 .  lisinopril (ZESTRIL) 20 MG tablet, TAKE 1 TABLET BY MOUTH EVERY DAY, Disp: 90 tablet, Rfl: 3 .  meloxicam (MOBIC) 7.5 MG tablet, TAKE 1 TABLET BY MOUTH EVERY DAY, Disp: 30 tablet, Rfl: 1 .  metFORMIN (GLUCOPHAGE) 1000 MG tablet, Take 0.5 tablets (500 mg total) by mouth daily., Disp: 180 tablet, Rfl: 1 .  oxyCODONE (ROXICODONE) 5 MG immediate release tablet, Take 1 tablet (5 mg total) by mouth every 6 (six) hours as needed for severe pain., Disp: 20 tablet, Rfl: 0 .  predniSONE (STERAPRED UNI-PAK 21 TAB) 10 MG (21) TBPK tablet, Take as directed, Disp: 21 tablet, Rfl: 0 .  REPATHA SURECLICK 370 MG/ML SOAJ, INJECT 140 MG INTO THE SKIN EVERY 14 (FOURTEEN) DAYS., Disp: 2 pen, Rfl: 3 .  traMADol (ULTRAM) 50 MG tablet, TAKE 1 TABLET BY MOUTH TWICE A DAY FOR 5 DAYS, Disp: , Rfl:  .  triamcinolone cream (KENALOG) 0.1 %, Apply 1 application topically 2 (two) times daily., Disp: 30 g, Rfl: 0  Review of Systems:  Constitutional: Denies fever, chills, diaphoresis, appetite change and fatigue.  HEENT: Denies photophobia, eye pain, redness, hearing loss, ear pain, congestion, sore throat, rhinorrhea, sneezing, mouth sores, trouble swallowing, neck pain, neck stiffness and tinnitus.   Respiratory: Denies SOB, DOE, cough, chest tightness,  and wheezing.   Cardiovascular: Denies chest pain, palpitations and  leg swelling.  Gastrointestinal: Denies nausea, vomiting, abdominal pain, diarrhea, constipation, blood in stool and abdominal distention.  Genitourinary: Denies dysuria, urgency, frequency, hematuria, flank pain and difficulty urinating.  Endocrine: Denies: hot or cold intolerance, sweats, changes in hair or nails, polyuria, polydipsia. Musculoskeletal: Denies myalgias, back pain, joint swelling, arthralgias and gait problem.  Skin: Denies pallor, rash and wound.  Neurological: Denies dizziness, seizures, syncope, weakness, light-headedness, numbness and headaches.  Hematological: Denies adenopathy. Easy bruising, personal or family bleeding history  Psychiatric/Behavioral: Denies suicidal ideation, mood changes, confusion, nervousness, sleep disturbance and agitation    Physical Exam: Vitals:   03/20/19 1326  BP: (!) 150/90  Pulse: 90  Temp: 97.7 F (36.5 C)  TempSrc: Temporal  SpO2: 97%  Weight: 179 lb 1.6 oz (81.2 kg)  Body mass index is 29.8 kg/m.   Constitutional: NAD, calm, comfortable Eyes: PERRL, lids and conjunctivae normal ENMT: Mucous membranes are moist.  Respiratory: clear to auscultation bilaterally, no wheezing, no crackles. Normal respiratory effort. No accessory muscle use.  Cardiovascular: Regular rate and rhythm, no murmurs / rubs / gallops. No extremity edema. 2+ pedal pulses. No carotid bruits.  Abdomen: no tenderness, no masses palpated. No hepatosplenomegaly. Bowel sounds positive.  Musculoskeletal: no clubbing / cyanosis. No joint deformity upper and lower extremities. Good ROM, no contractures. Normal muscle tone.  Skin: small area of eczema on the side of her left fifth toe Neurologic: CN 2-12 grossly intact. Sensation intact, DTR normal. Strength 5/5 in all 4.  Psychiatric: Normal judgment and insight. Alert and oriented x 3. Normal mood.    Impression and Plan:  Type 2 diabetes mellitus with other neurologic complication, without long-term  current use of insulin (HCC)  -Last A1c was 5.8 in Feb 2020; well controlled. -Will be fitted for diabetic shoes per request.  Flexural eczema  - Plan: triamcinolone cream (KENALOG) 0.1 %  Malignant hypertension -BP remains uncontrolled. -Review of meds: she is taking amlodipine and hydralazine but not lisinopril. -She will start back lisinopril 40 mg and return in 8 weeks for follow up.    Patient Instructions  -Nice seeing you today!!  -RESUME lisinopril 40 mg daily for your blood pressure.  -Schedule follow up in 8 weeks for your blood pressure.     [name], MD [facility]

## 2019-03-21 ENCOUNTER — Other Ambulatory Visit: Payer: Self-pay | Admitting: Internal Medicine

## 2019-03-21 DIAGNOSIS — I10 Essential (primary) hypertension: Secondary | ICD-10-CM

## 2019-03-21 MED ORDER — HYDROCHLOROTHIAZIDE 25 MG PO TABS: 25.0000 mg | ORAL_TABLET | Freq: Every day | ORAL | 0 refills | Status: DC

## 2019-03-21 NOTE — Telephone Encounter (Signed)
Requested medication (s) are due for refill today: yes  Requested medication (s) are on the active medication list: yes  Last refill:  11/22/2018  Future visit scheduled: no  Notes to clinic: last filled by historical provider    Requested Prescriptions  Pending Prescriptions Disp Refills   hydrochlorothiazide (HYDRODIURIL) 25 MG tablet       Cardiovascular: Diuretics - Thiazide Failed - 03/21/2019 11:30 AM      Failed - Last BP in normal range    BP Readings from Last 1 Encounters:  03/20/19 (!) 150/90         Passed - Ca in normal range and within 360 days    Calcium  Date Value Ref Range Status  02/06/2019 9.7 8.9 - 10.3 mg/dL Final  12/31/2014 9.4 8.4 - 10.4 mg/dL Final         Passed - Cr in normal range and within 360 days    Creatinine  Date Value Ref Range Status  12/31/2014 0.8 0.6 - 1.1 mg/dL Final   Creatinine, Ser  Date Value Ref Range Status  02/06/2019 1.00 0.44 - 1.00 mg/dL Final         Passed - K in normal range and within 360 days    Potassium  Date Value Ref Range Status  02/06/2019 4.6 3.5 - 5.1 mmol/L Final  12/31/2014 3.9 3.5 - 5.1 mEq/L Final         Passed - Na in normal range and within 360 days    Sodium  Date Value Ref Range Status  02/06/2019 140 135 - 145 mmol/L Final  12/31/2014 136 136 - 145 mEq/L Final         Passed - Valid encounter within last 6 months    Recent Outpatient Visits          Yesterday Type 2 diabetes mellitus with other neurologic complication, without long-term current use of insulin ([place])   [place] at [facility], Rayford Halsted, MD   2 months ago Alcoholic cirrhosis of liver without ascites ([place])   [place] at [facility], Rayford Halsted, MD   3 months ago 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

## 2019-03-21 NOTE — Telephone Encounter (Signed)
Medication Refill - Medication: hydrochlorothiazide (HYDRODIURIL) 25 MG tablet    Preferred Pharmacy (with phone number or street name):  [id number] [address], [place] [phone] (Phone) [phone] (Fax)

## 2019-03-22 ENCOUNTER — Other Ambulatory Visit: Payer: Self-pay | Admitting: Internal Medicine

## 2019-03-22 DIAGNOSIS — M5431 Sciatica, right side: Secondary | ICD-10-CM

## 2019-03-22 MED ORDER — MELOXICAM 7.5 MG PO TABS: 7.5000 mg | ORAL_TABLET | Freq: Every day | ORAL | 1 refills | Status: DC

## 2019-03-22 NOTE — Telephone Encounter (Signed)
Medication Refill - Medication: meloxicam (MOBIC) 7.5 MG tablet    Preferred Pharmacy (with phone number or street name):  [id number] [address], [place] [phone] (Phone) [phone] (Fax)

## 2019-03-30 ENCOUNTER — Other Ambulatory Visit: Payer: Self-pay | Admitting: Internal Medicine

## 2019-03-30 NOTE — Telephone Encounter (Signed)
Requested medication (s) are due for refill today: yes  Requested medication (s) are on the active medication list: yes  Last refill:  02/06/2019  Future visit scheduled: no  Notes to clinic:  Patient was given medication in the hospital   Requested Prescriptions  Pending Prescriptions Disp Refills   cyclobenzaprine (FLEXERIL) 10 MG tablet 20 tablet 0    Sig: Take 1 tablet (10 mg total) by mouth 2 (two) times daily as needed for muscle spasms.     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

## 2019-03-30 NOTE — Telephone Encounter (Signed)
Medication Refill - Medication: cyclobenzaprine (FLEXERIL) 10 MG tablet/Pt given rx in the hospital. Requesting refill. Please advise.  Has the patient contacted their pharmacy? Yes.   (Agent: If no, request that the patient contact the pharmacy for the refill.) (Agent: If yes, when and what did the pharmacy advise?)  Preferred Pharmacy (with phone number or street name):  [id number] [address], [place] [phone] (Phone) [phone] (Fax)     Agent: Please be advised that RX refills may take up to 3 business days. We ask that you follow-up with your pharmacy.

## 2019-03-30 NOTE — Telephone Encounter (Signed)
Message routed to PCP CMA  

## 2019-04-03 MED ORDER — CYCLOBENZAPRINE HCL 10 MG PO TABS: 10.0000 mg | ORAL_TABLET | Freq: Two times a day (BID) | ORAL | 0 refills | Status: DC | PRN

## 2019-04-04 ENCOUNTER — Encounter: Payer: Self-pay | Admitting: Podiatry

## 2019-04-04 ENCOUNTER — Other Ambulatory Visit: Payer: Self-pay

## 2019-04-04 ENCOUNTER — Ambulatory Visit (INDEPENDENT_AMBULATORY_CARE_PROVIDER_SITE_OTHER): Payer: Medicare Other | Admitting: Podiatry

## 2019-04-04 ENCOUNTER — Ambulatory Visit: Payer: Medicare Other | Admitting: Orthotics

## 2019-04-04 DIAGNOSIS — E1142 Type 2 diabetes mellitus with diabetic polyneuropathy: Secondary | ICD-10-CM | POA: Diagnosis not present

## 2019-04-04 DIAGNOSIS — E1140 Type 2 diabetes mellitus with diabetic neuropathy, unspecified: Secondary | ICD-10-CM | POA: Insufficient documentation

## 2019-04-04 NOTE — Progress Notes (Signed)

## 2019-04-04 NOTE — Progress Notes (Addendum)
This patient presents to the office with chief complaint of  diabetic feet.  This patient  says there  is  no pain and discomfort in her  feet.    Patient has no history of infection or drainage from both feet.  . This patient presents  to the office today for  a foot evaluation due to history of  Diabetes. And requests diabetic shoes.  Patient has history of CVA and numbness right foot due to knee injury.  General Appearance  Alert, conversant and in no acute stress.  Vascular  Dorsalis pedis and posterior tibial  pulses are palpable  bilaterally.  Capillary return is within normal limits  bilaterally. Temperature is within normal limits  bilaterally.  Neurologic  Senn-Weinstein monofilament wire test diminished   bilaterally. Muscle power within normal limits bilaterally.  Nails Normal nails noted with no drainage or infection.. No evidence of bacterial infection or drainage bilaterally.  Orthopedic  No limitations of motion of motion feet .  No crepitus or effusions noted. HAV  B/L with hammer toes fourth  B/l.  Skin  normotropic skin with no porokeratosis noted bilaterally.  No signs of infections or ulcers noted.     Onychomycosis  Diabetes with  Neuropathy.  IE   A diabetic foot exam was performed and there is  evidence of  neurologic pathology.  Vascular examination reveals no pathology. Patient is scheduled for appointment with Liliane Channel.  RTC 3 months.   Gardiner Barefoot DPM

## 2019-04-12 ENCOUNTER — Telehealth: Payer: Self-pay | Admitting: *Deleted

## 2019-04-12 NOTE — Telephone Encounter (Signed)
Copied from [place] [id number]. Topic: General - Inquiry >> Apr 12, 2019  3:59 PM Richardo Priest, [place] wrote: Reason for CRM: Pt called in stating her podiatrist sent forms over to office to have filled out for some diabetic shoes. Pt is needing form completed and sent back. Please advise

## 2019-04-23 NOTE — Telephone Encounter (Signed)
Patient is calling again to get the status of some forms that were supposed to be filled out for her diabetic shoes.  Please advise and call patient back at [phone]

## 2019-04-24 ENCOUNTER — Other Ambulatory Visit: Payer: Self-pay | Admitting: Neurology

## 2019-04-24 ENCOUNTER — Telehealth: Payer: Self-pay | Admitting: Neurology

## 2019-04-24 NOTE — Telephone Encounter (Signed)
[facility] [address] I stated pt was calling about not receiving her repatha. They stated does not have any refills. The pharmacy stated a refill was sent to our office.I stated per our chart there is no [facility]. She stated pts can call the MD office if they are out. I stated a refill will be sent for the pt.

## 2019-04-24 NOTE — Telephone Encounter (Signed)
Patient called concerned that she hasnt received her cholesterol shots in awhile and wanted to get an update.    Patient also mentioned that she was expecting to get orders/paperwork for diabetic shoes and believes it was sent to [place] instead of the PCP and would like to know if it was received.   Please follow up.

## 2019-04-24 NOTE — Telephone Encounter (Signed)
Spoke with patient and explained that we have not received a form for diabetic shoes.  Patient will reach out to have the form faxed.

## 2019-04-24 NOTE — Telephone Encounter (Signed)
I called pt that her repatha was out of refills per the pharmacy. Pt stated she miss a dosage of repatha last month. She stated the pharmacy never told her she was out of refill.I stated a refill was sent today and call the pharmacy. I stated any diabetic shoes paperwork should be forward to her PCP. Pt wanted to make sure it was not sent to our office.She verbalized understanding.

## 2019-05-07 NOTE — Telephone Encounter (Signed)
Pt called back investigating status of diabetic shoes. Please advise

## 2019-05-09 ENCOUNTER — Other Ambulatory Visit: Payer: Self-pay

## 2019-05-09 DIAGNOSIS — I6523 Occlusion and stenosis of bilateral carotid arteries: Secondary | ICD-10-CM

## 2019-05-10 ENCOUNTER — Ambulatory Visit (INDEPENDENT_AMBULATORY_CARE_PROVIDER_SITE_OTHER): Payer: Medicare Other | Admitting: Family

## 2019-05-10 ENCOUNTER — Other Ambulatory Visit: Payer: Self-pay

## 2019-05-10 ENCOUNTER — Encounter: Payer: Self-pay | Admitting: Family

## 2019-05-10 ENCOUNTER — Telehealth: Payer: Self-pay

## 2019-05-10 ENCOUNTER — Ambulatory Visit (HOSPITAL_COMMUNITY)
Admission: RE | Admit: 2019-05-10 | Discharge: 2019-05-10 | Disposition: A | Discharge status: Home / Self Care | Payer: Medicare Other | Source: Ambulatory Visit | Attending: Family | Admitting: Family

## 2019-05-10 VITALS — BP 189/82 | HR 80 | Temp 97.6°F | Resp 16 | Ht 65.0 in | Wt 179.0 lb

## 2019-05-10 DIAGNOSIS — Z8673 Personal history of transient ischemic attack (TIA), and cerebral infarction without residual deficits: Secondary | ICD-10-CM | POA: Diagnosis not present

## 2019-05-10 DIAGNOSIS — I6523 Occlusion and stenosis of bilateral carotid arteries: Secondary | ICD-10-CM

## 2019-05-10 NOTE — Progress Notes (Addendum)
Chief Complaint: Follow up Extracranial Carotid Artery Stenosis   History of Present Illness  Deborah Wise is a 68 y.o. female who initially presented to the hospital in January 2018 with slurred speech and right-sided weakness.  Her UDS was positive for cocaine. She has a history of hep C and cirrhosis. Her MRI was consistent with a left brain stroke. Her CT angiogram revealed a tight right carotid artery stenosis. She went on to have a cerebral angiogram which revealed the stenosis in the right carotid artery was 60-65 percent. 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  Dr. BTrula Sladelast evaluated pt on 04-25-17. At that timecarotid duplexdemonstrated astable 60-79% right carotid stenosisand1-39% left carotid stenosis Asymptomatic right carotid stenosis:Dr. Brabhamdiscussed with ptthe signs and symptoms of stroke.She understands that we recommend intervention for symptomatic disease or if the stenosis becomes greater than 80%.Dr. BBlair Promisethat her carotid stenosis on the right is not responsible for her left brain stroke. She was tofollow-up in 6 months with a repeat carotid duplex.  She reports that other than slurred speech and some right-sided weakness, her symptoms have remained stable. She reports falling 3x since August 2020, she was receiving physical therapy for sciatica.She states she feels like she lists to the right at times when walking. She states that she trouble getting in and out of her tub.  She states that she is trying to get a caregiver, states she has discussed this with her PCP.   ETOH use: she admits to a couple cans of beer and "a little drink" daily.  She states that she has trouble catching her breath since I last saw her, new is tickling feeling in throat and dry cough, some of her speech is unintelligible. She denies chest pain, states she  has headaches at times but not now, denies feeling light headed.   Diabetic:yes, last A1C result on file was 5.8 on 08-02-18, in excellent control Tobacco use:non-smoker  Pt meds include: Statin :no ASA:no Other anticoagulants/antiplatelets:no   Past Medical History:  Diagnosis Date  . Anemia   . Arthritis   . Blood transfusion without reported diagnosis   . Carotid artery occlusion   . [place]osis ([place]   . Diabetes mellitus without complication ([place]   . Fibroid, uterine   . GERD (gastroesophageal reflux disease)   . Hepatitis    hep C  . Hypertension   . Stroke ([place]   . Thrombocytopenia ([place] 12/25/2014  . Urinary incontinence    Patient noticed mild and is not currently a significant problem    Social History Social History   Tobacco Use  . Smoking status: Never Smoker  . Smokeless tobacco: Never Used  Substance Use Topics  . Alcohol use: Yes    Alcohol/week: 1.0 standard drinks    Types: 1 Cans of beer per week    Comment: 1/2 bottle of wine and some beer a day  . Drug use: Not Currently    Frequency: 1.0 times per week    Types: Cocaine    Comment: last use cocaine last year 2018    Family History Family History  Problem Relation Age of Onset  . Diabetes type II Mother   . Hypertension Mother   . Stroke Mother   . Cancer Father        patient does not know what type of cancer  . Diabetes Daughter   . Breast cancer Neg Hx   .  Colon polyps Neg Hx   . Crohn's disease Neg Hx   . Esophageal cancer Neg Hx   . Rectal cancer Neg Hx   . Stomach cancer Neg Hx   . Colon cancer Neg Hx     Surgical History Past Surgical History:  Procedure Laterality Date  . ANKLE FRACTURE SURGERY Right 1999   x 2, rod  . COLONOSCOPY    . IR GENERIC HISTORICAL  06/30/2016   IR ANGIO VERTEBRAL SEL SUBCLAVIAN INNOMINATE UNI R MOD SED 06/30/2016 [name], MD [facility]  . IR GENERIC HISTORICAL  06/30/2016   IR ANGIO VERTEBRAL SEL VERTEBRAL UNI L MOD SED  06/30/2016 [name], MD [facility]  . IR GENERIC HISTORICAL  06/30/2016   IR ANGIO INTRA EXTRACRAN SEL COM CAROTID INNOMINATE BILAT MOD SED 06/30/2016 [name], MD [facility]  . MULTIPLE EXTRACTIONS WITH ALVEOLOPLASTY  10/25/2011   Procedure: MULTIPLE EXTRACION WITH ALVEOLOPLASTY;  Surgeon: Gae Bon, DDS;  Location: [facility];  Service: Oral Surgery;  Laterality: Bilateral;  Extract teeth numbers one, two, six, seven, eight, nine, ten, eleven, twelve, fifteen, sixteen, eighteen, nineteen, twenty-three, twenty-four, twenty-five, twenty-seven, thirty-two and alveoplasty.    No Known Allergies  Current Outpatient Medications  Medication Sig Dispense Refill  . Accu-Chek FastClix Lancets MISC 1 each by Does not apply route daily. 100 each 3  . ACCU-CHEK SOFTCLIX LANCETS lancets Accu-Chek Softclix Lancets  USE AS DIRECTED TO CHECK BLOOD SUGAR TWICE A DAY    . amLODipine (NORVASC) 10 MG tablet TAKE 1 TABLET BY MOUTH EVERY DAY 90 tablet 1  . aspirin EC 81 MG tablet Take 1 tablet (81 mg total) by mouth daily. 31 tablet 11  . atorvastatin (LIPITOR) 80 MG tablet Take 1 tablet (80 mg total) by mouth daily. 90 tablet 3  . Blood Glucose Monitoring Suppl (ACCU-CHEK GUIDE) w/Device KIT Please specify directions, refills and quantity 1 kit 0  . cyclobenzaprine (FLEXERIL) 10 MG tablet Take 1 tablet (10 mg total) by mouth 2 (two) times daily as needed for muscle spasms. 20 tablet 0  . diclofenac (VOLTAREN) 50 MG EC tablet Take 1 tablet (50 mg total) by mouth 2 (two) times daily. 20 tablet 0  . glucose blood test strip 1 each by Other route daily. Dx E11.9 E11.69 100 each 3  . hydrALAZINE (APRESOLINE) 25 MG tablet TAKE 1 TABLET BY MOUTH THREE TIMES A DAY 270 tablet 0  . hydrochlorothiazide (HYDRODIURIL) 25 MG tablet Take 1 tablet (25 mg total) by mouth daily. 90 tablet 0  . lisinopril (PRINIVIL,ZESTRIL) 40 MG tablet Take 1 tablet (40 mg total) by mouth daily. 90 tablet 3  . lisinopril  (ZESTRIL) 20 MG tablet TAKE 1 TABLET BY MOUTH EVERY DAY 90 tablet 3  . meloxicam (MOBIC) 7.5 MG tablet Take 1 tablet (7.5 mg total) by mouth daily. 30 tablet 1  . metFORMIN (GLUCOPHAGE) 1000 MG tablet Take 0.5 tablets (500 mg total) by mouth daily. 180 tablet 1  . REPATHA SURECLICK 638 MG/ML SOAJ INJECT 140 MG INTO THE SKIN EVERY 14 (FOURTEEN) DAYS. 2 pen 6  . traMADol (ULTRAM) 50 MG tablet TAKE 1 TABLET BY MOUTH TWICE A DAY FOR 5 DAYS    . oxyCODONE (ROXICODONE) 5 MG immediate release tablet Take 1 tablet (5 mg total) by mouth every 6 (six) hours as needed for severe pain. (Patient not taking: Reported on 05/10/2019) 20 tablet 0  . predniSONE (STERAPRED UNI-PAK 21 TAB) 10 MG (21) TBPK tablet Take as directed (  Patient not taking: Reported on 05/10/2019) 21 tablet 0  . triamcinolone cream (KENALOG) 0.1 % Apply 1 application topically 2 (two) times daily. (Patient not taking: Reported on 05/10/2019) 30 g 0   No current facility-administered medications for this visit.     Review of Systems : See HPI for pertinent positives and negatives.  Physical Examination  Vitals:   05/10/19 1015 05/10/19 1022 05/10/19 1023  BP: (!) 189/84 (!) 186/85 (!) 189/82  Pulse: 80 80 80  Resp: 16    Temp: 97.6 F (36.4 C)    TempSrc: Temporal    SpO2: 99%    Weight: 179 lb (81.2 kg)    Height: '5\' 5"'$  (1.651 m)     Body mass index is 29.79 kg/m.  General: WDWN female in NAD GAIT: normal HEENT: No gross abnormalities.  Pulmonary:  Respirations are non-labored, fair air movement in all fields, no rales, rhonchi, or wheezes Cardiac: regular rhythm, no detected murmur.  VASCULAR EXAM Carotid Bruits Right Left   Negative Negative     Abdominal aortic pulse is not palpable. Radial pulses are 2+ palpable and equal.                                                                                                                            LE Pulses Right Left       POPLITEAL  not palpable   not palpable        POSTERIOR TIBIAL  not palpable   not palpable        DORSALIS PEDIS      ANTERIOR TIBIAL not palpable  not palpable     Gastrointestinal: soft, nontender, BS WNL, no r/g, no palpable masses. Musculoskeletal: no muscle atrophy/wasting. M/S 4/5 in right UE, 5/5 in left UE, 3/5 in right LE, 5/5 in left LE, extremities without ischemic changes Skin: No rashes, no ulcers, no cellulitis.   Neurologic:  A&O X 3; appropriate affect, sensation is normal; speech is soft but intelligible, CN 2-12 intact, pain and light touch intact in extremities, motor exam as listed above. Psychiatric: Normal thought content, mood appropriate to clinical situation.    DATA  Carotid Duplex (05-10-19): Right Carotid: Velocities in the right ICA are consistent with a 60-79%                stenosis. Non-hemodynamically significant plaque <50% noted in                the CCA. The ECA appears >50% stenosed. Left Carotid: Velocities in the left ICA are consistent with a 40-59% stenosis.               Non-hemodynamically significant plaque <50% noted in the CCA. The               ECA appears <50% stenosed. Vertebrals:  Bilateral vertebral arteries demonstrate antegrade flow. Subclavians: Normal flow hemodynamics were seen in bilateral subclavian arteries.    Carotid Duplex (11-06-18):  Right Carotid: Velocities in the right ICA are consistent with a 40-59% stenosis. Left Carotid: Velocities in the left ICA are consistent with a 1-39% stenosis. Non-hemodynamically significant plaque noted in the CCA. Vertebrals: Bilateral vertebral arteries demonstrate antegrade flow. Subclavians: Normal flow hemodynamics were seen in bilateral subclavian arteries. Appears to be less stenotic in the right ICA compared to the exam on 05-26-18, stable in the left ICA.   Assessment: Deborah Wise is a 68 y.o. female who presented to the hospital inJanuary 2018 with slurred speech  and right-sided weakness.  Her MRI was consistent with a left brain stroke. Her CT angiogram revealed a tight right carotid artery stenosis. She went on to have a cerebral angiogram which revealed the stenosis in the right carotid artery was 60-65 percent.  She has not had any worsening weakness, but has fallen 3x since August 2020.  Her blood pressure remains uncontrolled.  She was receiving treatment and PT for bilateral sciatica, states she has received spinal injections for this, she states no benefit from the injections.   I advised pt to call her PCP office and ask their advice re her trouble catching her breath for several months, and new tickling feeling in throat with dry cough. If she feels she needs to be seen immediately for shortness of breath, I advised her to call 911.    Plan: Follow-up in 6 months with Carotid Duplex scan.   I discussed in depth with the patient the nature of atherosclerosis, and emphasized the importance of maximal medical management including strict control of blood pressure, blood glucose, and lipid levels, obtaining regular exercise, and continued cessation of smoking.  The patient is aware that without maximal medical management the underlying atherosclerotic disease process will progress, limiting the benefit of any interventions. The patient was given information about stroke prevention and what symptoms should prompt the patient to seek immediate medical care. Thank you for allowing [place] to participate in this patient's care.  Clemon Chambers, RN, MSN, FNP-C [facility] Office: [phone]  Clinic Physician: Laqueta Due  05/10/19 10:32 AM

## 2019-05-10 NOTE — Telephone Encounter (Signed)
Repatha PA done on cover my meds. Message from Plan Request Reference Number: [id number]. REPATHA SURE INJ 140MG /ML is approved through 06/06/2020.  For further questions, call [phone]

## 2019-05-10 NOTE — Patient Instructions (Signed)

## 2019-05-11 ENCOUNTER — Other Ambulatory Visit: Payer: Self-pay | Admitting: Internal Medicine

## 2019-05-11 ENCOUNTER — Telehealth: Payer: Self-pay | Admitting: Internal Medicine

## 2019-05-11 DIAGNOSIS — M5431 Sciatica, right side: Secondary | ICD-10-CM

## 2019-05-11 NOTE — Telephone Encounter (Signed)
I called the pt I did not see a cardiologist referral   Informed her of this she is requesting a referral to see a cardiologist "because she has a hard time breathing " pt words, please advise    Copied from [place] [phone]. Topic: General - Inquiry >> May 10, 2019  3:17 PM Virl Axe D wrote: Reason for CRM: Pt stated she thought Dr. Jerilee Hoh was referring her to a cardiologist and would like an update. Please advise.

## 2019-05-11 NOTE — Telephone Encounter (Signed)
I called the Deborah Wise to inform her of Dr. Ledell Noss response. Deborah Wise replied   " I will do that then " . The Deborah Wise said she would go to UC or ED to be assessed for her SOB.

## 2019-05-11 NOTE — Telephone Encounter (Signed)
Also spoke with pt and she reports increased difficulty breathing with shob w/minimal exertion and difficulty speaking in long sentences and needing to take frequent deep breaths. Pt was audibly breathing heavy on the phone and having to take multiple breaths during a sentence. I advised pt she needs to seek evaluation in UC/ED asap and she needs to have someone drive her as she does not need to be driving. She voiced understanding and states she will get someone to take her tonight. Nothing further needed at this time.

## 2019-05-11 NOTE — Telephone Encounter (Signed)
We can place a cardiology referral, but if she is having SOB, she needs to be assessed in the UC/ED immediately.

## 2019-05-14 ENCOUNTER — Telehealth: Payer: Self-pay

## 2019-05-14 NOTE — Telephone Encounter (Signed)
Copied from [place] [id number]. Topic: General - Other >> May 14, 2019  2:16 PM Deborah Wise wrote: Pt did not go to UC/ER over the weekend said she is better, but still having breathing issues and would like to see her PCP

## 2019-05-15 NOTE — Telephone Encounter (Signed)
Yes, I filled this out a while back. It it's not in the blue folder was probably already faxed back.

## 2019-05-15 NOTE — Telephone Encounter (Signed)
Form has already been faxed. I have re-faxed. Patient is aware.

## 2019-05-15 NOTE — Telephone Encounter (Signed)
Spoke with patient appointment has been scheduled.

## 2019-05-16 ENCOUNTER — Other Ambulatory Visit: Payer: Self-pay

## 2019-05-16 ENCOUNTER — Telehealth: Payer: Medicare Other | Admitting: Internal Medicine

## 2019-05-18 ENCOUNTER — Ambulatory Visit: Payer: Medicare Other | Admitting: Orthotics

## 2019-05-18 ENCOUNTER — Other Ambulatory Visit: Payer: Self-pay

## 2019-05-18 DIAGNOSIS — M2042 Other hammer toe(s) (acquired), left foot: Secondary | ICD-10-CM | POA: Diagnosis not present

## 2019-05-18 DIAGNOSIS — M2041 Other hammer toe(s) (acquired), right foot: Secondary | ICD-10-CM | POA: Diagnosis not present

## 2019-05-18 DIAGNOSIS — E1140 Type 2 diabetes mellitus with diabetic neuropathy, unspecified: Secondary | ICD-10-CM | POA: Diagnosis not present

## 2019-05-31 ENCOUNTER — Telehealth: Payer: Self-pay | Admitting: *Deleted

## 2019-05-31 NOTE — Telephone Encounter (Signed)
Copied from [place] [phone]. Topic: Appointment Scheduling - Scheduling Inquiry for Clinic >> May 31, 2019  1:46 PM Mathis Bud wrote: Reason for CRM: Patient is calling office reschedule her virtual appt on 12/9.  Call back [phone]

## 2019-06-02 ENCOUNTER — Other Ambulatory Visit: Payer: Self-pay | Admitting: Internal Medicine

## 2019-06-04 ENCOUNTER — Other Ambulatory Visit: Payer: Self-pay | Admitting: *Deleted

## 2019-06-04 DIAGNOSIS — I6523 Occlusion and stenosis of bilateral carotid arteries: Secondary | ICD-10-CM

## 2019-06-05 NOTE — Telephone Encounter (Signed)
Returned call to patient and rescheduled appointment.

## 2019-06-15 ENCOUNTER — Other Ambulatory Visit: Payer: Self-pay | Admitting: Internal Medicine

## 2019-06-15 ENCOUNTER — Telehealth (INDEPENDENT_AMBULATORY_CARE_PROVIDER_SITE_OTHER): Payer: Medicare Other | Admitting: Internal Medicine

## 2019-06-15 ENCOUNTER — Telehealth: Payer: Self-pay | Admitting: *Deleted

## 2019-06-15 ENCOUNTER — Other Ambulatory Visit: Payer: Self-pay

## 2019-06-15 DIAGNOSIS — R0602 Shortness of breath: Secondary | ICD-10-CM

## 2019-06-15 DIAGNOSIS — E1142 Type 2 diabetes mellitus with diabetic polyneuropathy: Secondary | ICD-10-CM

## 2019-06-15 DIAGNOSIS — M5431 Sciatica, right side: Secondary | ICD-10-CM | POA: Diagnosis not present

## 2019-06-15 MED ORDER — MELOXICAM 7.5 MG PO TABS: 7.5000 mg | ORAL_TABLET | Freq: Every day | ORAL | 1 refills | Status: DC

## 2019-06-15 MED ORDER — METFORMIN HCL 1000 MG PO TABS: 500.0000 mg | ORAL_TABLET | Freq: Every day | ORAL | 1 refills | Status: DC

## 2019-06-15 NOTE — Progress Notes (Signed)
Virtual Visit via Telephone Note  I connected with [place] on 06/15/19 at  2:00 PM EST by telephone and verified that I am speaking with the correct person using two identifiers.   I discussed the limitations, risks, security and privacy concerns of performing an evaluation and management service by telephone and the availability of in person appointments. I also discussed with the patient that there may be a patient responsible charge related to this service. The patient expressed understanding and agreed to proceed.  Location patient: home Location provider: work office Participants present for the call: patient, provider Patient did not have a visit in the prior 7 days to address this/these issue(s).   History of Present Illness:  Deborah Wise has a PMH significant for:   CVA on January 2018, uncontrolled hypertension, well-controlled type 2 diabetes, hyperlipidemia, history of alcohol and polysubstance abuse including cocaine and marijuana. History of carotid artery disease followed by vascular surgery. 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 Thrombocytopenia due to splenomegaly.  She has scheduled a VV for today to discuss some "breathing issues". For about 1 month she has been SOB. It has gotten progressively worse to the point where even walking around her house causes symptoms. She is audibly SOB on the phone today and unable to speak in full sentences. She denies fever, HA, URI symptoms, N/V/D. No known COVID exposures. Denies orthopnea and PND. ECHO in 2018 with EF 60-65% and grade 1 DD. She has "rib pain" under her right breast.    Observations/Objective: Patient sounds very SOB and is unable to speak in full sentences, having to take a pause to breathe every few words. Speech and thought processing are grossly intact. Patient reported vitals: none reported   Current Outpatient Medications:  .  Accu-Chek FastClix Lancets MISC, 1 each  by Does not apply route daily., Disp: 100 each, Rfl: 3 .  ACCU-CHEK GUIDE test strip, 1 EACH BY OTHER ROUTE DAILY. DX E11.9 E11.69, Disp: 100 strip, Rfl: 0 .  ACCU-CHEK SOFTCLIX LANCETS lancets, Accu-Chek Softclix Lancets  USE AS DIRECTED TO CHECK BLOOD SUGAR TWICE A DAY, Disp: , Rfl:  .  amLODipine (NORVASC) 10 MG tablet, TAKE 1 TABLET BY MOUTH EVERY DAY, Disp: 90 tablet, Rfl: 1 .  aspirin EC 81 MG tablet, Take 1 tablet (81 mg total) by mouth daily., Disp: 31 tablet, Rfl: 11 .  atorvastatin (LIPITOR) 80 MG tablet, Take 1 tablet (80 mg total) by mouth daily., Disp: 90 tablet, Rfl: 3 .  Blood Glucose Monitoring Suppl (ACCU-CHEK GUIDE) w/Device KIT, Please specify directions, refills and quantity, Disp: 1 kit, Rfl: 0 .  cyclobenzaprine (FLEXERIL) 10 MG tablet, Take 1 tablet (10 mg total) by mouth 2 (two) times daily as needed for muscle spasms., Disp: 20 tablet, Rfl: 0 .  diclofenac (VOLTAREN) 50 MG EC tablet, Take 1 tablet (50 mg total) by mouth 2 (two) times daily., Disp: 20 tablet, Rfl: 0 .  hydrALAZINE (APRESOLINE) 25 MG tablet, TAKE 1 TABLET BY MOUTH THREE TIMES A DAY, Disp: 270 tablet, Rfl: 0 .  hydrochlorothiazide (HYDRODIURIL) 25 MG tablet, TAKE 1 TABLET BY MOUTH EVERY DAY, Disp: 90 tablet, Rfl: 0 .  lisinopril (PRINIVIL,ZESTRIL) 40 MG tablet, Take 1 tablet (40 mg total) by mouth daily., Disp: 90 tablet, Rfl: 3 .  lisinopril (ZESTRIL) 20 MG tablet, TAKE 1 TABLET BY MOUTH EVERY DAY, Disp: 90 tablet, Rfl: 3 .  meloxicam (MOBIC) 7.5 MG tablet, Take 1 tablet (7.5  mg total) by mouth daily., Disp: 90 tablet, Rfl: 1 .  metFORMIN (GLUCOPHAGE) 1000 MG tablet, Take 0.5 tablets (500 mg total) by mouth daily., Disp: 180 tablet, Rfl: 1 .  oxyCODONE (ROXICODONE) 5 MG immediate release tablet, Take 1 tablet (5 mg total) by mouth every 6 (six) hours as needed for severe pain. (Patient not taking: Reported on 05/10/2019), Disp: 20 tablet, Rfl: 0 .  predniSONE (STERAPRED UNI-PAK 21 TAB) 10 MG (21) TBPK tablet,  Take as directed (Patient not taking: Reported on 05/10/2019), Disp: 21 tablet, Rfl: 0 .  REPATHA SURECLICK 140 MG/ML SOAJ, INJECT 140 MG INTO THE SKIN EVERY 14 (FOURTEEN) DAYS., Disp: 2 pen, Rfl: 6 .  traMADol (ULTRAM) 50 MG tablet, TAKE 1 TABLET BY MOUTH TWICE A DAY FOR 5 DAYS, Disp: , Rfl:  .  triamcinolone cream (KENALOG) 0.1 %, Apply 1 application topically 2 (two) times daily. (Patient not taking: Reported on 05/10/2019), Disp: 30 g, Rfl: 0  Review of Systems:  Constitutional: Denies fever, chills, diaphoresis, appetite change and fatigue.  HEENT: Denies photophobia, eye pain, redness, hearing loss, ear pain, congestion, sore throat, rhinorrhea, sneezing, mouth sores, trouble swallowing, neck pain, neck stiffness and tinnitus.   Respiratory: Denies  cough,  and wheezing.   Cardiovascular: Denies chest pain, palpitations and leg swelling.  Gastrointestinal: Denies nausea, vomiting, abdominal pain, diarrhea, constipation, blood in stool and abdominal distention.  Genitourinary: Denies dysuria, urgency, frequency, hematuria, flank pain and difficulty urinating.  Endocrine: Denies: hot or cold intolerance, sweats, changes in hair or nails, polyuria, polydipsia. Musculoskeletal: Denies myalgias, back pain, joint swelling, arthralgias and gait problem.  Skin: Denies pallor, rash and wound.  Neurological: Denies dizziness, seizures, syncope, weakness, light-headedness, numbness and headaches.  Hematological: Denies adenopathy. Easy bruising, personal or family bleeding history  Psychiatric/Behavioral: Denies suicidal ideation, mood changes, confusion, nervousness, sleep disturbance and agitation   Assessment and Plan:  SOB (shortness of breath) -Broad DDx at this time: PNA, COVID (less likely), acute diastolic CHF. -Have advised evaluation in ED urgently.    I discussed the assessment and treatment plan with the patient. The patient was provided an opportunity to ask questions and all were  answered. The patient agreed with the plan and demonstrated an understanding of the instructions.   The patient was advised to call back or seek an in-person evaluation if the symptoms worsen or if the condition fails to improve as anticipated.  I provided 11 minutes of non-face-to-face time during this encounter.   Estela Hernandez Acosta, MD [facility]  

## 2019-06-15 NOTE — Telephone Encounter (Signed)
Patient was advised per Dr Jerilee Hoh to go to the ED for SOB.  Patient was called multiple times with no answer.  Message also left for son on DPR. 911 was called for a well fare  Check. A call will be sent to the on call doctor if needed.

## 2019-07-10 ENCOUNTER — Other Ambulatory Visit: Payer: Self-pay | Admitting: Internal Medicine

## 2019-07-10 DIAGNOSIS — I639 Cerebral infarction, unspecified: Secondary | ICD-10-CM

## 2019-07-10 DIAGNOSIS — I6350 Cerebral infarction due to unspecified occlusion or stenosis of unspecified cerebral artery: Secondary | ICD-10-CM

## 2019-07-12 DIAGNOSIS — H25013 Cortical age-related cataract, bilateral: Secondary | ICD-10-CM | POA: Diagnosis not present

## 2019-07-12 DIAGNOSIS — H40033 Anatomical narrow angle, bilateral: Secondary | ICD-10-CM | POA: Diagnosis not present

## 2019-07-12 DIAGNOSIS — H11153 Pinguecula, bilateral: Secondary | ICD-10-CM | POA: Diagnosis not present

## 2019-07-12 DIAGNOSIS — H40023 Open angle with borderline findings, high risk, bilateral: Secondary | ICD-10-CM | POA: Diagnosis not present

## 2019-07-12 DIAGNOSIS — H2513 Age-related nuclear cataract, bilateral: Secondary | ICD-10-CM | POA: Diagnosis not present

## 2019-07-16 DIAGNOSIS — K7469 Other cirrhosis of liver: Secondary | ICD-10-CM | POA: Diagnosis not present

## 2019-07-16 DIAGNOSIS — R296 Repeated falls: Secondary | ICD-10-CM | POA: Diagnosis not present

## 2019-07-17 ENCOUNTER — Other Ambulatory Visit: Payer: Self-pay | Admitting: Nurse Practitioner

## 2019-07-17 DIAGNOSIS — K7469 Other cirrhosis of liver: Secondary | ICD-10-CM

## 2019-07-25 ENCOUNTER — Ambulatory Visit
Admission: RE | Admit: 2019-07-25 | Discharge: 2019-07-25 | Disposition: A | Discharge status: Home / Self Care | Payer: Medicare Other | Source: Ambulatory Visit | Attending: Nurse Practitioner | Admitting: Nurse Practitioner

## 2019-07-25 DIAGNOSIS — K7469 Other cirrhosis of liver: Secondary | ICD-10-CM

## 2019-07-25 DIAGNOSIS — K7460 Unspecified cirrhosis of liver: Secondary | ICD-10-CM | POA: Diagnosis not present

## 2019-07-30 DIAGNOSIS — K7469 Other cirrhosis of liver: Secondary | ICD-10-CM | POA: Diagnosis not present

## 2019-07-31 DIAGNOSIS — H40023 Open angle with borderline findings, high risk, bilateral: Secondary | ICD-10-CM | POA: Diagnosis not present

## 2019-07-31 DIAGNOSIS — H2513 Age-related nuclear cataract, bilateral: Secondary | ICD-10-CM | POA: Diagnosis not present

## 2019-07-31 DIAGNOSIS — H40033 Anatomical narrow angle, bilateral: Secondary | ICD-10-CM | POA: Diagnosis not present

## 2019-08-06 DIAGNOSIS — H5213 Myopia, bilateral: Secondary | ICD-10-CM | POA: Diagnosis not present

## 2019-08-08 ENCOUNTER — Other Ambulatory Visit: Payer: Self-pay

## 2019-08-08 ENCOUNTER — Ambulatory Visit (INDEPENDENT_AMBULATORY_CARE_PROVIDER_SITE_OTHER): Payer: Medicare Other | Admitting: Adult Health

## 2019-08-08 ENCOUNTER — Encounter: Payer: Self-pay | Admitting: Adult Health

## 2019-08-08 VITALS — BP 174/87 | HR 85 | Temp 97.4°F | Ht 65.0 in | Wt 180.2 lb

## 2019-08-08 DIAGNOSIS — I6521 Occlusion and stenosis of right carotid artery: Secondary | ICD-10-CM | POA: Diagnosis not present

## 2019-08-08 DIAGNOSIS — E7849 Other hyperlipidemia: Secondary | ICD-10-CM | POA: Diagnosis not present

## 2019-08-08 DIAGNOSIS — G5731 Lesion of lateral popliteal nerve, right lower limb: Secondary | ICD-10-CM

## 2019-08-08 DIAGNOSIS — Z8673 Personal history of transient ischemic attack (TIA), and cerebral infarction without residual deficits: Secondary | ICD-10-CM | POA: Diagnosis not present

## 2019-08-08 NOTE — Patient Instructions (Signed)
Continue aspirin 81 mg daily  and lipitor and repatha  for secondary stroke prevention  Continue to follow up with PCP regarding cholesterol, blood pressure and diabetes management   Please ensure you schedule office visit with your PCP regarding elevated blood pressures and shortness of breath concerns. If anything should worsen, please do not hesitate to call 911 or proceed to ED  Continue to monitor blood pressure at home  Maintain strict control of hypertension with blood pressure goal below 130/90, diabetes with hemoglobin A1c goal below 6.5% and cholesterol with LDL cholesterol (bad cholesterol) goal below 70 mg/dL. I also advised the patient to eat a healthy diet with plenty of whole grains, cereals, fruits and vegetables, exercise regularly and maintain ideal body weight.  Followup in the future with me in 6 months or call earlier if needed       Thank you for coming to see [place] at [facility]. I hope we have been able to provide you high quality care today.  You may receive a patient satisfaction survey over the next few weeks. We would appreciate your feedback and comments so that we may continue to improve ourselves and the health of our patients.

## 2019-08-08 NOTE — Progress Notes (Signed)
[facility] [address] [place]. [place] [address] [phone]       OFFICE FOLLOW UP NOTE  Ms. KEERSTIN BJELLAND Date of Birth:  December 02, 1950 Medical Record Number:  [id number]   Referring MD: Dr. Jerilee Hoh  Reason for Referral: Falls and balance issues  Chief complaint: Chief Complaint  Patient presents with  . Follow-up    Rm 9  . Peripheral Neuropathy      HPI:   Ms. Grivas is a 69 year old female who is being seen today, 08/08/2019, for routine follow-up. 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  She has not had any additional falls.  She has been stable from a stroke standpoint with continuation of aspirin 81 mg daily, atorvastatin 80 mg daily and Repatha without side effects for secondary stroke prevention.  Lipid panel obtained after prior visit with LDL 59.  Blood pressure today 174/87 - hasn't taken medications yet today as she normally takes around 12pm.  Patient reports typically at home SBPs 140s. No further neurological concerns at this time.   History copied for reference purposes only Initial consult visit 08/02/2018 Dr. Leonie Man: Ms. Rom is a 69 year old African-American lady seen today for office consultation visit for frequent falls.  History is obtained from the patient, review of electronic medical records and have personally reviewed imaging films where available. She states that she has had a couple of falls since January.  She states the falls are minor they were all unprovoked.  She had no time to catch herself.  She did not lose consciousness.  She bruised her knee.  She was unable to get up without assistance.  She denies any significant pain in the back, radicular pain or pain in the feet.  She does have chronic numbness in her right foot.  She is has remote history of stroke in January 2018 when she had a right paramedian  pontine lacunar infarct.  She did have some left leg incoordination but she did improve.  She denies any previous back surgeries.  She denies any lack of bowel or bladder control.  She does admit that she cannot walk far and has to sit down because her legs hurt.  She has not had any follow-up neurovascular studies done since her stroke.  She did have a cerebral catheter angiogram on 06/30/2016 by Dr. 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  She was subsequently followed at vascular surgery clinic and last carotid ultrasound on 05/26/2018 had shown stable 60 to 79% right ICA stenosis.  She has not had any recurrent stroke or TIA symptoms.  She states she is on aspirin and tolerating it well without bleeding or bruising.  Her blood pressure usually is better controlled but today it is elevated in office at 156/85.  She is unable to tell me when she had her last lipid profile or hemoglobin A1c checked.  She does have diabetes and states that her control is adequate.  She has had some minor paresthesias in the feet and does not feel that this is getting worse. Update 02/14/2019 Dr. Leonie Man ; she returns for follow-up after last visit 6 months ago.  Patient states about a week or 10 days ago she twisted her back when she stood up and had sudden onset  of severe muscle tightness in cramp-like pain in the right posterior back extending to the flank and upper abdomen.  She in fact was seen in the ER on 02/06/2019 for this and thought to have a muscle spasm.  She had an ultrasound of the right upper quadrant which showed no evidence of cholecystitis or any acute hepatic abnormality.  Chest x-ray showed no evidence of pneumonia.  Patient has been started on muscle relaxant Flexeril as well as Mobic and tramadol as needed.  She states her medicines are not obtaining substantial relief.  She did undergo EMG nerve conduction  study by Dr. 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  No evidence of generalized peripheral neuropathy or radiculopathy was found.  Patient denies habitual squatting or recurrent injuries to her right knee.  She however does admit that she sleeps on her side mostly on the right side.  She also had a follow-up carotid ultrasound in 11/06/2018 which showed moderate for 40 to 59% right ICA and 1-39% left ICA stenosis.  Lipid profile on 08/02/2018 showed LDL cholesterol of 125 and hemoglobin A1c of 5.8.  Patient was started on Repatha injections 140 mg every 2 weeks and has been tolerating it well without injection site reactions or other side effects.  She however remains on Lipitor 80 mg as well.  She has not had any follow-up lipid profile checked.        ROS:   14 system review of systems is positive for back pain, shortness of breath and all other systems negative decreased energy and all other systems negative  PMH:  Past Medical History:  Diagnosis Date  . Anemia   . Arthritis   . Blood transfusion without reported diagnosis   . Carotid artery occlusion   . Cirrhosis ([place])   . Diabetes mellitus without complication ([place])   . Fibroid, uterine   . GERD (gastroesophageal reflux disease)   . Hepatitis    hep C  . Hypertension   . Stroke ([place])   . Thrombocytopenia ([place]) 12/25/2014  . Urinary incontinence    Patient noticed mild and is not currently a significant problem    Social History:  Social History   Socioeconomic History  . Marital status: Legally Separated    Spouse name: Not on file  . Number of children: 3  . Years of education: Not on file  . Highest education level: Not on file  Occupational History  . Occupation: disabled  Tobacco Use  . Smoking status: Never Smoker  . Smokeless tobacco: Never Used  Substance and Sexual Activity  . Alcohol use: Yes    Alcohol/week: 1.0 standard drinks    Types: 1 Cans  of beer per week    Comment: 1/2 bottle of wine and some beer a day  . Drug use: Not Currently    Frequency: 1.0 times per week    Types: Cocaine    Comment: last use cocaine last year 2018  . Sexual activity: Not Currently  Other Topics Concern  . Not on file  Social History Narrative  . Not on file   Social Determinants of Health   Financial Resource Strain:   . Difficulty of Paying Living Expenses: Not on file  Food Insecurity:   . Worried About Charity fundraiser in the Last Year: Not on file  . Ran Out of Food in the Last Year: Not on file  Transportation Needs:   . Lack  of Transportation (Medical): Not on file  . Lack of Transportation (Non-Medical): Not on file  Physical Activity:   . Days of Exercise per Week: Not on file  . Minutes of Exercise per Session: Not on file  Stress:   . Feeling of Stress : Not on file  Social Connections:   . Frequency of Communication with Friends and Family: Not on file  . Frequency of Social Gatherings with Friends and Family: Not on file  . Attends Religious Services: Not on file  . Active Member of Clubs or Organizations: Not on file  . Attends Archivist Meetings: Not on file  . Marital Status: Not on file  Intimate Partner Violence:   . Fear of Current or Ex-Partner: Not on file  . Emotionally Abused: Not on file  . Physically Abused: Not on file  . Sexually Abused: Not on file    Medications:   Current Outpatient Medications on File Prior to Visit  Medication Sig Dispense Refill  . Accu-Chek FastClix Lancets MISC 1 each by Does not apply route daily. 100 each 3  . ACCU-CHEK GUIDE test strip 1 EACH BY OTHER ROUTE DAILY. DX E11.9 E11.69 100 strip 0  . ACCU-CHEK SOFTCLIX LANCETS lancets Accu-Chek Softclix Lancets  USE AS DIRECTED TO CHECK BLOOD SUGAR TWICE A DAY    . amLODipine (NORVASC) 10 MG tablet TAKE 1 TABLET BY MOUTH EVERY DAY 90 tablet 1  . aspirin EC 81 MG tablet Take 1 tablet (81 mg total) by mouth daily.  31 tablet 11  . atorvastatin (LIPITOR) 80 MG tablet Take 1 tablet (80 mg total) by mouth daily. 90 tablet 3  . Blood Glucose Monitoring Suppl (ACCU-CHEK GUIDE) w/Device KIT Please specify directions, refills and quantity 1 kit 0  . hydrochlorothiazide (HYDRODIURIL) 25 MG tablet TAKE 1 TABLET BY MOUTH EVERY DAY 90 tablet 0  . lisinopril (ZESTRIL) 20 MG tablet TAKE 1 TABLET BY MOUTH EVERY DAY 90 tablet 3  . meloxicam (MOBIC) 7.5 MG tablet Take 1 tablet (7.5 mg total) by mouth daily. 90 tablet 1  . oxyCODONE (ROXICODONE) 5 MG immediate release tablet Take 1 tablet (5 mg total) by mouth every 6 (six) hours as needed for severe pain. 20 tablet 0  . REPATHA SURECLICK 161 MG/ML SOAJ INJECT 140 MG INTO THE SKIN EVERY 14 (FOURTEEN) DAYS. 2 pen 6  . triamcinolone cream (KENALOG) 0.1 % Apply 1 application topically 2 (two) times daily. 30 g 0   No current facility-administered medications on file prior to visit.    Allergies:  No Known Allergies  Physical Exam  Today's Vitals   08/08/19 1153  BP: (!) 174/87  Pulse: 85  Temp: (!) 97.4 F (36.3 C)  Weight: 180 lb 3.2 oz (81.7 kg)  Height: _0  (1.651 m)   Body mass index is 29.99 kg/m.   General: Pleasant middle-aged African-American lady, seated, in no evident distress Head: head normocephalic and atraumatic.   Neck: supple with no carotid or supraclavicular bruits Cardiovascular: regular rate and rhythm, no murmurs Musculoskeletal: no deformity Skin:  no rash/petichiae Vascular:  Normal pulses all extremities  Neurologic Exam Mental Status: Awake and fully alert.  Mild dysarthria.  Oriented to place and time. Recent and remote memory intact. Attention span, concentration and fund of knowledge appropriate. Mood and affect appropriate.  Cranial Nerves: Fundoscopic exam reveals sharp disc margins. Pupils equal, briskly reactive to light. Extraocular movements full without nystagmus. Visual fields full to confrontation. Hearing intact.  Facial sensation  intact. Face, tongue, palate moves normally and symmetrically.  Motor: Normal bulk and tone. Normal strength in all tested extremity muscles. Sensory.: intact to touch , pinprick , position but diminished vibratory sensation over toes bilaterally.  Coordination: Rapid alternating movements normal in all extremities. Finger-to-nose and heel-to-shin performed accurately bilaterally. Gait and Station: Arises from chair without difficulty. Stance is normal. Gait demonstrates normal stride length and balance . Able to heel, toe and tandem walk with moderate t difficulty.  Reflexes: 1+ and symmetric except ankle jerks are diminished. Toes downgoing.      ASSESSMENT: 69 year old African-American lady with recent episodes of falls likely multifactorial due to combination of underlying right peroneal neuropathy as well as prior right pontine stroke in January 2018 and asymptomatic moderate right carotid stenosis.  Vascular risk factors of diabetes, hypertension, hyperlipidemia, stroke and carotid stenosis.       PLAN: -Continuation of use of pillow between legs at night while sleeping for ongoing benefit of right peroneal neuropathy -Continue aspirin, atorvastatin and Repatha for secondary stroke prevention  -Maintain strict control of hypertension with blood pressure goal below 130/90, lipids with LDL cholesterol goal below 70 mg percent and diabetes with hemoglobin A1c goal below 6.5%.   -Continue yearly carotid ultrasound studies to follow her moderate right carotid stenosis with vascular surgery.  -Elevated blood pressure during visit with elevated blood pressure during multiple other provider appointments.  Advised to ensure she monitors at home with a log and follow-up with PCP for ongoing monitoring and management.  Advised that blood pressure remains elevated and becomes symptomatic or levels increase, to proceed to ED or call 911 immediately due to increased risk of  stroke   Follow-up in 6 months or call earlier if needed  Greater than 50% time during this 20-minute  visit were spent on counseling and coordination of care about her falls and remote stroke and perobneal neuropathy, discussion regarding importance of managing stroke risk factors including HTN, HLD and DM, discussion regarding increased risk with continued elevated blood pressure and answering questions to patient satisfaction   Frann Rider, AGNP-BC  [facility] [address] [place], [place] [address]  Phone [phone] Fax [phone] Note: This document was prepared with digital dictation and possible smart phrase technology. Any transcriptional errors that result from this process are unintentional.

## 2019-08-08 NOTE — Progress Notes (Signed)
I agree with the above plan 

## 2019-09-04 ENCOUNTER — Other Ambulatory Visit: Payer: Self-pay | Admitting: Internal Medicine

## 2019-09-19 DIAGNOSIS — H524 Presbyopia: Secondary | ICD-10-CM | POA: Diagnosis not present

## 2019-09-24 ENCOUNTER — Other Ambulatory Visit: Payer: Self-pay | Admitting: Internal Medicine

## 2019-09-24 DIAGNOSIS — I6350 Cerebral infarction due to unspecified occlusion or stenosis of unspecified cerebral artery: Secondary | ICD-10-CM

## 2019-09-24 DIAGNOSIS — I639 Cerebral infarction, unspecified: Secondary | ICD-10-CM

## 2019-10-24 ENCOUNTER — Other Ambulatory Visit: Payer: Self-pay | Admitting: Internal Medicine

## 2019-10-24 DIAGNOSIS — L2082 Flexural eczema: Secondary | ICD-10-CM

## 2019-10-29 ENCOUNTER — Other Ambulatory Visit: Payer: Self-pay

## 2019-10-30 ENCOUNTER — Telehealth: Payer: Self-pay | Admitting: Internal Medicine

## 2019-10-30 ENCOUNTER — Ambulatory Visit (INDEPENDENT_AMBULATORY_CARE_PROVIDER_SITE_OTHER): Payer: Medicare Other | Admitting: Internal Medicine

## 2019-10-30 DIAGNOSIS — R300 Dysuria: Secondary | ICD-10-CM

## 2019-10-30 LAB — POCT URINALYSIS DIPSTICK
Bilirubin, UA: NEGATIVE
Blood, UA: NEGATIVE
Glucose, UA: NEGATIVE
Ketones, UA: NEGATIVE
Leukocytes, UA: NEGATIVE
Nitrite, UA: NEGATIVE
Protein, UA: POSITIVE — AB
Spec Grav, UA: 1.015 (ref 1.010–1.025)
Urobilinogen, UA: 0.2 E.U./dL
pH, UA: 5.5 (ref 5.0–8.0)

## 2019-10-30 NOTE — Addendum Note (Signed)
Addended by: Elmer Picker on: 10/30/2019 12:41 PM   Modules accepted: Orders

## 2019-10-30 NOTE — Telephone Encounter (Signed)
Form placed in Dr Hernandez's folder. 

## 2019-10-30 NOTE — Progress Notes (Signed)
Virtual Visit via Telephone Note  I connected with Deborah Wise on 10/30/19 at 11:30 AM EDT by telephone and verified that I am speaking with the correct person using two identifiers.   I discussed the limitations, risks, security and privacy concerns of performing an evaluation and management service by telephone and the availability of in person appointments. I also discussed with the patient that there may be a patient responsible charge related to this service. The patient expressed understanding and agreed to proceed.  Location patient: home Location provider: work office Participants present for the call: patient, provider Patient did not have a visit in the prior 7 days to address this/these issue(s).   History of Present Illness:  C/o 3 days of dysuria, urine frequency and foul smell. Has had this with a UTI before. No fevers, n/v.   Observations/Objective: Patient sounds cheerful and well on the phone. I do not appreciate any increased work of breathing. Speech and thought processing are grossly intact. Patient reported vitals: none reported   Current Outpatient Medications:  .  Accu-Chek FastClix Lancets MISC, 1 each by Does not apply route daily., Disp: 100 each, Rfl: 3 .  ACCU-CHEK GUIDE test strip, 1 EACH BY OTHER ROUTE DAILY. DX E11.9 E11.69, Disp: 100 strip, Rfl: 0 .  ACCU-CHEK SOFTCLIX LANCETS lancets, Accu-Chek Softclix Lancets  USE AS DIRECTED TO CHECK BLOOD SUGAR TWICE A DAY, Disp: , Rfl:  .  amLODipine (NORVASC) 10 MG tablet, TAKE 1 TABLET BY MOUTH EVERY DAY, Disp: 90 tablet, Rfl: 1 .  ASPIRIN LOW DOSE 81 MG EC tablet, TAKE 1 TABLET BY MOUTH EVERY DAY, Disp: 90 tablet, Rfl: 0 .  atorvastatin (LIPITOR) 80 MG tablet, Take 1 tablet (80 mg total) by mouth daily., Disp: 90 tablet, Rfl: 3 .  Blood Glucose Monitoring Suppl (ACCU-CHEK GUIDE) w/Device KIT, Please specify directions, refills and quantity, Disp: 1 kit, Rfl: 0 .  hydrochlorothiazide (HYDRODIURIL) 25  MG tablet, TAKE 1 TABLET BY MOUTH EVERY DAY, Disp: 90 tablet, Rfl: 0 .  lisinopril (ZESTRIL) 20 MG tablet, TAKE 1 TABLET BY MOUTH EVERY DAY, Disp: 90 tablet, Rfl: 3 .  meloxicam (MOBIC) 7.5 MG tablet, Take 1 tablet (7.5 mg total) by mouth daily., Disp: 90 tablet, Rfl: 1 .  oxyCODONE (ROXICODONE) 5 MG immediate release tablet, Take 1 tablet (5 mg total) by mouth every 6 (six) hours as needed for severe pain., Disp: 20 tablet, Rfl: 0 .  REPATHA SURECLICK 017 MG/ML SOAJ, INJECT 140 MG INTO THE SKIN EVERY 14 (FOURTEEN) DAYS., Disp: 2 pen, Rfl: 6 .  triamcinolone cream (KENALOG) 0.1 %, APPLY TO AFFECTED AREA TWICE A DAY, Disp: 30 g, Rfl: 2  Review of Systems:  Constitutional: Denies fever, chills, diaphoresis, appetite change and fatigue.  HEENT: Denies photophobia, eye pain, redness, hearing loss, ear pain, congestion, sore throat, rhinorrhea, sneezing, mouth sores, trouble swallowing, neck pain, neck stiffness and tinnitus.   Respiratory: Denies SOB, DOE, cough, chest tightness,  and wheezing.   Cardiovascular: Denies chest pain, palpitations and leg swelling.  Gastrointestinal: Denies nausea, vomiting, abdominal pain, diarrhea, constipation, blood in stool and abdominal distention.  Genitourinary: Denies dysuria, urgency, frequency, hematuria, flank pain and difficulty urinating.  Endocrine: Denies: hot or cold intolerance, sweats, changes in hair or nails, polyuria, polydipsia. Musculoskeletal: Denies myalgias, back pain, joint swelling, arthralgias and gait problem.  Skin: Denies pallor, rash and wound.  Neurological: Denies dizziness, seizures, syncope, weakness, light-headedness, numbness and headaches.  Hematological: Denies adenopathy. Easy bruising, personal  or family bleeding history  Psychiatric/Behavioral: Denies suicidal ideation, mood changes, confusion, nervousness, sleep disturbance and agitation   Assessment and Plan:  Dysuria - Plan: POCT urinalysis dipstick, Urine  Culture  -Dipstick only positive for protein. -Send for UA and culture. -Hold off on abx for now pending cx data.  I discussed the assessment and treatment plan with the patient. The patient was provided an opportunity to ask questions and all were answered. The patient agreed with the plan and demonstrated an understanding of the instructions.   The patient was advised to call back or seek an in-person evaluation if the symptoms worsen or if the condition fails to improve as anticipated.  I provided 12 minutes of non-face-to-face time during this encounter.   [name], MD [facility]

## 2019-10-30 NOTE — Telephone Encounter (Signed)
Pt had an appt earlier today and was told to call back with information. Pt did not give me that information and has questions about forms that she needs to be filled out. Thanks

## 2019-10-30 NOTE — Telephone Encounter (Signed)
[facility] [employer] forms to be filled out- placed in dr's folder.  Mail to:  [facility]              [place], [place]  [address]

## 2019-10-31 LAB — URINE CULTURE
MICRO NUMBER:: 10517201
SPECIMEN QUALITY:: ADEQUATE

## 2019-11-01 ENCOUNTER — Other Ambulatory Visit: Payer: Self-pay

## 2019-11-07 ENCOUNTER — Other Ambulatory Visit: Payer: Self-pay

## 2019-11-07 ENCOUNTER — Ambulatory Visit (INDEPENDENT_AMBULATORY_CARE_PROVIDER_SITE_OTHER): Payer: Medicare Other | Admitting: Podiatry

## 2019-11-07 ENCOUNTER — Telehealth: Payer: Self-pay | Admitting: Internal Medicine

## 2019-11-07 ENCOUNTER — Other Ambulatory Visit: Payer: Self-pay | Admitting: Podiatry

## 2019-11-07 ENCOUNTER — Encounter: Payer: Self-pay | Admitting: Podiatry

## 2019-11-07 ENCOUNTER — Ambulatory Visit (INDEPENDENT_AMBULATORY_CARE_PROVIDER_SITE_OTHER): Payer: Medicare Other

## 2019-11-07 DIAGNOSIS — E785 Hyperlipidemia, unspecified: Secondary | ICD-10-CM

## 2019-11-07 DIAGNOSIS — I63012 Cerebral infarction due to thrombosis of left vertebral artery: Secondary | ICD-10-CM

## 2019-11-07 DIAGNOSIS — E1149 Type 2 diabetes mellitus with other diabetic neurological complication: Secondary | ICD-10-CM

## 2019-11-07 DIAGNOSIS — M205X1 Other deformities of toe(s) (acquired), right foot: Secondary | ICD-10-CM

## 2019-11-07 DIAGNOSIS — M129 Arthropathy, unspecified: Secondary | ICD-10-CM | POA: Diagnosis not present

## 2019-11-07 DIAGNOSIS — M5431 Sciatica, right side: Secondary | ICD-10-CM

## 2019-11-07 DIAGNOSIS — I10 Essential (primary) hypertension: Secondary | ICD-10-CM

## 2019-11-07 DIAGNOSIS — E1169 Type 2 diabetes mellitus with other specified complication: Secondary | ICD-10-CM

## 2019-11-07 DIAGNOSIS — M79671 Pain in right foot: Secondary | ICD-10-CM | POA: Diagnosis not present

## 2019-11-07 MED ORDER — MELOXICAM 15 MG PO TABS
15.0000 mg | ORAL_TABLET | Freq: Every day | ORAL | 0 refills | Status: DC
Start: 2019-11-07 — End: 2019-11-29

## 2019-11-07 NOTE — Chronic Care Management (AMB) (Signed)
  Chronic Care Management   Note  11/07/2019 Name: Deborah Wise MRN: [id number] DOB: Jan 13, 1951  Deborah Wise is a 69 y.o. year old female who is a primary care patient of Isaac Bliss, Rayford Halsted, MD. I reached out to [name] by phone today in response to a referral sent by [name]'s PCP, Isaac Bliss, Rayford Halsted, MD.   [name] was given information about Chronic Care Management services today including:  1. CCM service includes personalized support from designated clinical staff supervised by her physician, including individualized plan of care and coordination with other care providers 2. 24/7 contact phone numbers for assistance for urgent and routine care needs. 3. Service will only be billed when office clinical staff spend 20 minutes or more in a month to coordinate care. 4. Only one practitioner may furnish and bill the service in a calendar month. 5. The patient may stop CCM services at any time (effective at the end of the month) by phone call to the office staff.   Patient agreed to services and verbal consent obtained.  This note is not being shared with the patient for the following reason: To respect privacy (The patient or proxy has requested that the information not be shared).   Follow up plan:   [place]

## 2019-11-07 NOTE — Progress Notes (Signed)
This patient presents the office with chief complaint of sharp shooting pains noted into her toes on her right foot greater than her left foot.  She says this sharp shooting pain has been present for months.  She states that she is experiencing a sharp shooting pains during activity which causes her to stop and rest her foot.  This patient does have a history of having an ankle fracture corrected surgically.  This patient is diabetic and believes it may be neuropathy pain.  Patient has a history of cirrhosis of the liver diabetic neuropathy and a history of a CVA.  She presents the office today for preventative foot care services but desires to have her sharp shooting pain evaluated instead of having her nails treated.  Patient has received diabetic shoes which she says she wears.    Vascular  Dorsalis pedis and posterior tibial pulses are palpable  B/L.  Capillary return  WNL.  Temperature gradient is  WNL.  Skin turgor  WNL  Sensorium  Senn Weinstein monofilament wire diminished  B/L.  [name] Kitchen Normal tactile sensation.  Nail Exam  Patient has normal nails with no evidence of bacterial or fungal infection.  Orthopedic  Exam  Muscle tone and muscle strength  WNL.  No limitations of motion feet  B/L.  No crepitus or joint effusion noted.  Foot type is unremarkable and digits show no abnormalities.  HAV 1st MPJ  B/L.  Contracted/deviated digits  3-5 right.  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  Skin  No open lesions.  Normal skin texture and turgor.  Arthritis toes  Right foot.    ROV.  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  Internal  Fixation right ankle.  Arthritis  DIPJ 3,4,5 right foot.  Discussed this condition with this patient.  Told her she has arthritis in toes and big toe joint which reveals problems transferring weight through right foot.  Also arthritis from ankle fracture cause problems transferring weight.  Prescribe  Mobic 15 mg. To take daily as needed.   Gardiner Barefoot DPM

## 2019-11-08 ENCOUNTER — Other Ambulatory Visit: Payer: Self-pay | Admitting: Internal Medicine

## 2019-11-08 NOTE — Telephone Encounter (Signed)
Form faxed, confirmed, sent to be scanned.  Patient is aware.

## 2019-11-08 NOTE — Telephone Encounter (Signed)
Pt called to check to see if this is completed. She needs it completed asap.   Pt can be reached at [phone]

## 2019-11-12 ENCOUNTER — Other Ambulatory Visit: Payer: Self-pay | Admitting: Internal Medicine

## 2019-11-12 DIAGNOSIS — E785 Hyperlipidemia, unspecified: Secondary | ICD-10-CM

## 2019-11-12 DIAGNOSIS — E1169 Type 2 diabetes mellitus with other specified complication: Secondary | ICD-10-CM

## 2019-11-26 ENCOUNTER — Other Ambulatory Visit: Payer: Self-pay | Admitting: Internal Medicine

## 2019-11-26 NOTE — Telephone Encounter (Signed)
Pt called [facility] and informed the pt that they do not have the form yet. Pt is calling to see if PCP filled out form and had it mailed to address listed below?   Pt can be reached at [phone]

## 2019-11-27 NOTE — Telephone Encounter (Signed)
Copy mailed.  Patient is aware.

## 2019-11-28 DIAGNOSIS — H43392 Other vitreous opacities, left eye: Secondary | ICD-10-CM | POA: Diagnosis not present

## 2019-11-28 DIAGNOSIS — H2513 Age-related nuclear cataract, bilateral: Secondary | ICD-10-CM | POA: Diagnosis not present

## 2019-11-28 DIAGNOSIS — H0014 Chalazion left upper eyelid: Secondary | ICD-10-CM | POA: Diagnosis not present

## 2019-11-28 DIAGNOSIS — H4322 Crystalline deposits in vitreous body, left eye: Secondary | ICD-10-CM | POA: Diagnosis not present

## 2019-11-29 ENCOUNTER — Other Ambulatory Visit: Payer: Self-pay | Admitting: Podiatry

## 2019-11-29 NOTE — Telephone Encounter (Signed)
Please advise 

## 2019-11-30 ENCOUNTER — Other Ambulatory Visit: Payer: Self-pay | Admitting: *Deleted

## 2019-11-30 ENCOUNTER — Telehealth: Payer: Self-pay | Admitting: *Deleted

## 2019-11-30 NOTE — Telephone Encounter (Signed)
Prescribe refill on Mobic.

## 2019-11-30 NOTE — Telephone Encounter (Signed)
Refill medicine per patient's request

## 2019-12-03 NOTE — Telephone Encounter (Signed)
Pt is calling in stating that [facility] has not received the forms back via fax or mail.  Pt is aware that on 6/22 the forms was mailed out and to give the facility a couple more days (Wednesday 6/30) to receive it since it was snail mailed and give that facility a call back to see if they have received it if not to please give [place] a call so that we can research it more and assist with it.

## 2019-12-05 NOTE — Telephone Encounter (Signed)
Pt stated the facility has not received it yet. Informed pt that when we gave it to our courier on 6/22 it may take a few days to process and then 5-10 business days to receive it. Asked her to call back on Tuesday 7/6 if they still do not have it.

## 2019-12-06 ENCOUNTER — Ambulatory Visit: Payer: Medicare Other | Admitting: Podiatry

## 2019-12-11 NOTE — Telephone Encounter (Signed)
Pt called the facility and informed her that they still have not received the forms. They were able to provider her with a fax number for [place] to send it.  FAX: [phone]

## 2019-12-12 NOTE — Telephone Encounter (Signed)
Re-faxed and confirmed

## 2019-12-13 ENCOUNTER — Telehealth: Payer: Self-pay | Admitting: Internal Medicine

## 2019-12-13 NOTE — Telephone Encounter (Signed)
Deborah Wise is calling in stating that she did not receive the application for the pt to receive assist from the [facility].  She is wanting to know if it can be resent please.

## 2019-12-13 NOTE — Telephone Encounter (Signed)
Forms were faxed and confirmed 12/12/19.  Copy mailed to [facility] 12/13/19.  Patient is aware.

## 2019-12-18 ENCOUNTER — Other Ambulatory Visit: Payer: Self-pay | Admitting: Internal Medicine

## 2019-12-18 DIAGNOSIS — H43823 Vitreomacular adhesion, bilateral: Secondary | ICD-10-CM | POA: Diagnosis not present

## 2019-12-18 DIAGNOSIS — E119 Type 2 diabetes mellitus without complications: Secondary | ICD-10-CM | POA: Diagnosis not present

## 2019-12-18 DIAGNOSIS — H4323 Crystalline deposits in vitreous body, bilateral: Secondary | ICD-10-CM | POA: Diagnosis not present

## 2019-12-18 DIAGNOSIS — H35033 Hypertensive retinopathy, bilateral: Secondary | ICD-10-CM | POA: Diagnosis not present

## 2019-12-18 DIAGNOSIS — I639 Cerebral infarction, unspecified: Secondary | ICD-10-CM

## 2019-12-19 NOTE — Chronic Care Management (AMB) (Deleted)
Chronic Care Management Pharmacy  Name: Deborah Wise  MRN: [id number] DOB: 12/19/1950  Chief Complaint/ HPI  Deborah Wise,  69 y.o. , female presents for their {Initial/Follow-up:3041532} CCM visit with the clinical pharmacist {CHL HP Upstream Pharm visit PVXY:[id number]}.  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 3 days of dysuria, urine frequency and foul smell. Denies fevers and N/V. Dipstick positive for protein. Hold off on abx while culture is pending.   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 Been stable and improving with the use of pillow between hers legs at night. Stable with stroke prevention with aspirin 81 mg daily, atorvastatin 80 mg daily, and Repatha. F/U im 6 months.  Medications: Outpatient Encounter Medications as of 12/26/2019  Medication Sig  . Accu-Chek FastClix Lancets MISC 1 each by Does not apply route daily.  [name] Kitchen ACCU-CHEK GUIDE test strip 1 EACH BY OTHER ROUTE DAILY. DX E11.9 E11.69  . ACCU-CHEK SOFTCLIX LANCETS lancets Accu-Chek Softclix Lancets  USE AS DIRECTED TO CHECK BLOOD SUGAR TWICE A DAY  . amLODipine (NORVASC) 10 MG tablet TAKE 1 TABLET BY MOUTH EVERY DAY  . ASPIRIN LOW DOSE 81 MG EC tablet TAKE 1 TABLET BY MOUTH EVERY DAY  . atorvastatin (LIPITOR) 80 MG tablet TAKE 1 TABLET BY MOUTH EVERY DAY  . Blood Glucose Monitoring Suppl (ACCU-CHEK GUIDE) w/Device KIT Please specify directions, refills and quantity  . Cholecalciferol 50 MCG (2000 UT) CAPS Vitamin D3 50 mcg (2,000 unit) capsule  Take 1 capsule  every day by oral route.  . cyanocobalamin 1000 MCG tablet Vitamin B-12  1,000 mcg tablet  Take 1 tablet every day by oral route.  . hydrochlorothiazide (HYDRODIURIL) 25 MG tablet TAKE 1 TABLET BY MOUTH EVERY DAY  . lisinopril (ZESTRIL) 20 MG tablet TAKE 1 TABLET BY MOUTH EVERY DAY  . meloxicam (MOBIC) 15 MG tablet TAKE 1 TABLET BY MOUTH EVERY DAY  . meloxicam (MOBIC) 7.5 MG tablet Take 1 tablet (7.5 mg total) by mouth daily.  . metFORMIN (GLUCOPHAGE) 1000 MG tablet   . oxyCODONE (ROXICODONE) 5 MG immediate release tablet Take 1 tablet (5 mg total) by mouth every 6 (six) hours as needed for severe pain.  [name] Kitchen REPATHA SURECLICK 270 MG/ML SOAJ INJECT 140 MG INTO THE SKIN EVERY 14 (FOURTEEN) DAYS.  [name] Kitchen triamcinolone cream (KENALOG) 0.1 % APPLY TO AFFECTED AREA TWICE A DAY   No facility-administered encounter medications on file as of 12/26/2019.     Current Diagnosis/Assessment:  Goals Addressed   None     {CHL HP Upstream Pharmacy Diagnosis/[phone]}   Hypertension   BP today is:  {CHL HP UPSTREAM Pharmacist BP ranges:[phone]}  Office blood pressures are  BP Readings from Last 3 Encounters:  08/08/19 (!) 174/87  05/10/19 (!) 189/82  04/04/19 (!) 167/94    Patient has failed these meds in the past: hydralazine, furosemide Patient is currently {CHL Controlled/Uncontrolled:[phone]} on the following medications:  . Amlodipine 10 mg 1 tablet daily . HCTZ 25 mg 1 tablet daily . Lisinopril 20 mg 1 tablet daily  Patient checks BP at home {CHL  HP BP Monitoring [phone]}  Patient home BP readings are ranging: ***  We discussed {CHL HP Upstream Pharmacy discussion:[id number]}  Plan  Continue {CHL HP Upstream Pharmacy Plans:[phone]}    Hyperlipidemia   Lipid Panel     Component Value Date/Time   CHOL 122 02/14/2019 1008   TRIG 67 02/14/2019 1008   HDL 49 02/14/2019 1008   CHOLHDL 2.5 02/14/2019 1008   CHOLHDL 5 06/22/2018 0901   VLDL 16.8  06/22/2018 0901   LDLCALC 59 02/14/2019 1008   LABVLDL 14 02/14/2019 1008     The ASCVD Risk score (Goff DC Jr., et al., 2013) failed to calculate for the following reasons:   The patient has a prior MI or stroke diagnosis   Patient has failed these meds in past: NONE Patient is currently {CHL Controlled/Uncontrolled:[phone]} on the following medications:  . Atorvastatin 80 mg 1 tablet daily . Repatha 140 mg/ml every 14 days  We discussed:  {CHL HP Upstream Pharmacy discussion:[id number]}  Plan  Continue {CHL HP Upstream Pharmacy Plans:[phone]}   Diabetes   Recent Relevant Labs: Lab Results  Component Value Date/Time   HGBA1C 5.8 (H) 08/02/2018 10:41 AM   HGBA1C 5.4 06/28/2016 04:49 AM   GFR 86.68 09/07/2018 01:21 PM   GFR 102.46 06/22/2018 09:01 AM   MICROALBUR 52.8 (H) 06/22/2018 09:01 AM     Checking BG: {CHL HP Blood Glucose Monitoring Frequency:[id number]}  Recent FBG Readings: *** Recent pre-meal BG readings: *** Recent 2hr PP BG readings:  *** Recent HS BG readings: ***  Patient has failed these meds in past: glimepiride Patient is currently {CHL Controlled/Uncontrolled:[phone]} on the following medications:  [name] Kitchen Metformin 1000 mg *** sig 0.5 tablet daily?  Last diabetic Eye exam: No results found for: HMDIABEYEEXA  Last diabetic Foot exam: No results found for: HMDIABFOOTEX   We discussed: {CHL HP Upstream Pharmacy discussion:[id number]}  Plan  Continue {CHL HP Upstream Pharmacy Plans:[phone]}    Pain   Patient has failed these meds in past: naproxen, nabumetone, tramadol Patient is currently {CHL Controlled/Uncontrolled:[phone]} on the following medications:  [name] Kitchen Meloxicam 15 mg 1 tablet daily . Meloxicam 7.5 mg 1 tablet daily *** . Oxycodone 5 mg 1 tablet every 6 hrs PRN pain  We discussed:  ***  Plan  Continue {CHL HP Upstream Pharmacy YOKHT:[id number]}   OTCs/Health Maintenance   Patient is currently {CHL  Controlled/Uncontrolled:[phone]} on the following medications: . Aspirin 81 mg 1 tablet daily . Vitamin D3 2000 IU 1 capsule daily . Vitamin B12 1000 mcg 1 tablet daily . Triamcinolone 0.1% cream twice daily  We discussed:  ***  Plan  Continue {CHL HP Upstream Pharmacy VUYEB:[id number]}    Vaccines   Reviewed and discussed patient's vaccination history.    Immunization History  Administered Date(s) Administered  . Influenza, High Dose Seasonal PF 06/22/2018  . Influenza,inj,Quad PF,6+ Mos 06/29/2016  . Td 08/03/2017    Plan  Recommended patient receive *** vaccine in *** office.    Medication Management   Pharmacy/Benefits: [facility] [place] / [facility] Adherence:  Pt endorses ***% compliance  We discussed: ***  Plan  {US Pharmacy HFGB:02111}   Follow up: *** month phone visit   Geraldine Contras, PharmD Clinical Pharmacist [facility] [place] [phone]

## 2019-12-24 ENCOUNTER — Inpatient Hospital Stay (HOSPITAL_COMMUNITY): Admission: RE | Admit: 2019-12-24 | Payer: Medicare Other | Source: Ambulatory Visit

## 2019-12-24 ENCOUNTER — Ambulatory Visit: Payer: Medicare Other

## 2019-12-24 ENCOUNTER — Other Ambulatory Visit: Payer: Self-pay | Admitting: Podiatry

## 2019-12-25 ENCOUNTER — Other Ambulatory Visit: Payer: Self-pay | Admitting: Neurology

## 2019-12-25 NOTE — Chronic Care Management (AMB) (Deleted)
Chronic Care Management Pharmacy  Name: Deborah Wise  MRN: [id number] DOB: 06/21/1950  Chief Complaint/ HPI  Deborah Wise,  69 y.o. , female presents for their Initial CCM visit with the clinical pharmacist In office.  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 3 days of dysuria, urine frequency and foul smell. Denies fevers and N/V. Dipstick positive for protein. Hold off on abx while culture is pending.   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 Been stable and improving with the use of pillow between hers legs at night. Stable with stroke prevention with aspirin 81 mg daily, atorvastatin 80 mg daily, and Repatha. F/U im 6 months.  Medications: Outpatient Encounter Medications as of 12/26/2019  Medication Sig  . Accu-Chek FastClix Lancets MISC 1 each by Does not apply route daily.  [name] Kitchen ACCU-CHEK GUIDE test strip 1 EACH BY OTHER ROUTE DAILY. DX E11.9 E11.69  . ACCU-CHEK SOFTCLIX LANCETS lancets Accu-Chek Softclix Lancets  USE AS DIRECTED TO CHECK BLOOD SUGAR TWICE A DAY  . amLODipine (NORVASC) 10 MG tablet TAKE 1 TABLET BY MOUTH EVERY DAY  . ASPIRIN LOW DOSE 81 MG EC tablet TAKE 1 TABLET BY MOUTH EVERY DAY  . atorvastatin (LIPITOR) 80 MG tablet TAKE 1 TABLET BY MOUTH EVERY DAY  . Blood Glucose Monitoring Suppl (ACCU-CHEK GUIDE) w/Device KIT Please specify directions, refills and quantity  . Cholecalciferol 50 MCG (2000 UT) CAPS Vitamin D3 50 mcg (2,000 unit) capsule  Take 1 capsule every day by oral route.  . cyanocobalamin 1000 MCG tablet  Vitamin B-12  1,000 mcg tablet  Take 1 tablet every day by oral route.  . hydrochlorothiazide (HYDRODIURIL) 25 MG tablet TAKE 1 TABLET BY MOUTH EVERY DAY  . lisinopril (ZESTRIL) 20 MG tablet TAKE 1 TABLET BY MOUTH EVERY DAY  . meloxicam (MOBIC) 15 MG tablet TAKE 1 TABLET BY MOUTH EVERY DAY  . meloxicam (MOBIC) 7.5 MG tablet Take 1 tablet (7.5 mg total) by mouth daily.  . metFORMIN (GLUCOPHAGE) 1000 MG tablet   . oxyCODONE (ROXICODONE) 5 MG immediate release tablet Take 1 tablet (5 mg total) by mouth every 6 (six) hours as needed for severe pain.  [name] Kitchen REPATHA SURECLICK 321 MG/ML SOAJ INJECT 140 MG INTO THE SKIN EVERY 14 (FOURTEEN) DAYS.  [name] Kitchen triamcinolone cream (KENALOG) 0.1 % APPLY TO AFFECTED AREA TWICE A DAY  . [DISCONTINUED] meloxicam (MOBIC) 15 MG tablet TAKE 1 TABLET BY MOUTH EVERY DAY   No facility-administered encounter medications on file as of 12/26/2019.     Current Diagnosis/Assessment:  Goals Addressed            This Visit's Progress   . Chronic Care Management         {[facility]L [facility] Diagnosis/Assessment:[phone]}   Hypertension   BP today is:  {CHL HP [facility] Pharmacist BP ranges:[phone]}  Office blood pressures are  BP Readings from Last 3 Encounters:  08/08/19 (!) 174/87  05/10/19 (!) 189/82  04/04/19 (!) 167/94   CMP Latest Ref Rng & Units 02/14/2019 02/06/2019 12/10/2018  Glucose 70 - 99 mg/dL - 106(H)  126(H)  BUN 8 - 23 mg/dL - 15 23  Creatinine 0.44 - 1.00 mg/dL - 1.00 1.05(H)  Sodium 135 - 145 mmol/L - 140 141  Potassium 3.5 - 5.1 mmol/L - 4.6 4.2  Chloride 98 - 111 mmol/L - 104 106  CO2 22 - 32 mmol/L - 23 25  Calcium 8.9 - 10.3 mg/dL - 9.7 10.3  Total Protein 6.0 - 8.5 g/dL 7.1 7.8 -  Total Bilirubin 0.0 - 1.2 mg/dL 1.1 1.7(H) -  Alkaline Phos 39 - 117 IU/L 100 75 -  AST 0 - 40 IU/L 38 77(H) -  ALT 0 - 32 IU/L 32 47(H) -   Patient has failed these meds in the past: hydralazine, furosemide Patient is currently {CHL  Controlled/Uncontrolled:[phone]} on the following medications:  . Amlodipine 10 mg 1 tablet daily . HCTZ 25 mg 1 tablet daily . Lisinopril 20 mg 1 tablet daily  Patient checks BP at home {CHL HP BP Monitoring Frequency:[phone]}  Patient home BP readings are ranging: ***  We discussed {[facility]L [facility] [phone]}  Plan  Continue {CHL [facility] Plans:[phone]}    Hyperlipidemia   Lipid Panel     Component Value Date/Time   CHOL 122 02/14/2019 1008   TRIG 67 02/14/2019 1008   HDL 49 02/14/2019 1008   CHOLHDL 2.5 02/14/2019 1008   CHOLHDL 5 06/22/2018 0901   VLDL 16.8 06/22/2018 0901   LDLCALC 59 02/14/2019 1008   LABVLDL 14 02/14/2019 1008     The ASCVD Risk score (Goff DC Jr., et al., 2013) failed to calculate for the following reasons:   The patient has a prior MI or stroke diagnosis   Patient has failed these meds in past: NONE Patient is currently {CHL Controlled/Uncontrolled:[phone]} on the following medications:  . Atorvastatin 80 mg 1 tablet daily . Repatha 140 mg/ml every 14 days  We discussed:  {[facility]L [facility] [phone]}  Plan  Continue {CHL [facility] Plans:[phone]}   Diabetes   Recent Relevant Labs: Lab Results  Component Value Date/Time   HGBA1C 5.8 (H) 08/02/2018 10:41 AM   HGBA1C 5.4 06/28/2016 04:49 AM   GFR 86.68 09/07/2018 01:21 PM   GFR 102.46 06/22/2018 09:01 AM   MICROALBUR 52.8 (H) 06/22/2018 09:01 AM     Checking BG: {CHL HP Blood Glucose Monitoring Frequency:[phone]}  Recent FBG Readings: *** Recent pre-meal BG readings: *** Recent 2hr PP BG readings:  *** Recent HS BG readings: ***  Patient has failed these meds in past: glimepiride Patient is currently {CHL Controlled/Uncontrolled:[phone]} on the following medications:  [name] Kitchen Metformin 1000 mg *** sig 0.5 tablet daily?  Last diabetic Eye exam: No results found for: HMDIABEYEEXA  Last diabetic Foot  exam: No results found for: HMDIABFOOTEX   We discussed: {[facility]L [facility] [phone]}  Plan  Continue {CHL [facility] Plans:[phone]}    Pain   Patient has failed these meds in past: naproxen, nabumetone, tramadol Patient is currently {CHL Controlled/Uncontrolled:[phone]} on the following medications:  [name] Kitchen Meloxicam 15 mg 1 tablet daily . Meloxicam 7.5 mg 1 tablet daily *** . Oxycodone 5 mg 1 tablet every 6 hrs PRN pain  We discussed:  ***  Plan  Continue {[facility]L [facility] JJOAC:[id number]}   OTCs/Health Maintenance   Patient is currently {CHL Controlled/Uncontrolled:[phone]} on the following medications: . Aspirin 81 mg 1 tablet daily . Vitamin D3 2000 IU 1 capsule daily . Vitamin B12 1000 mcg 1 tablet daily . Triamcinolone 0.1% cream twice  daily  We discussed:  ***  Plan  Continue {[facility]L [facility] NIDPO:[id number]}    Vaccines   Reviewed and discussed patient's vaccination history.    Immunization History  Administered Date(s) Administered  . Influenza, High Dose Seasonal PF 06/22/2018  . Influenza,inj,Quad PF,6+ Mos 06/29/2016  . Td 08/03/2017    Plan  Recommended patient receive *** vaccine in *** office.    Medication Management   Pharmacy/Benefits: [facility] [place] [facility] Adherence:  Pt endorses ***% compliance  We discussed: ***  Plan  {US Pharmacy RXVQ:00867}   Follow up: *** month phone visit  Doristine Section Clinical Pharmacist [facility] [place]

## 2019-12-25 NOTE — Chronic Care Management (AMB) (Signed)
Chronic Care Management Pharmacy  Name: Deborah Wise  MRN: [id number] DOB: 31-Dec-1950   Chief Complaint/ HPI  Deborah Wise,  69 y.o. , female presents for their Initial CCM visit with the clinical pharmacist via telephone.  PCP : Isaac Bliss, Rayford Halsted, MD Patient Care Team: Isaac Bliss, Rayford Halsted, MD as PCP - General (Internal Medicine) Eli Hose, [facility] as Pharmacist (Pharmacist)  Their chronic conditions include: Hypertension, Hyperlipidemia, Diabetes and History of stroke, Cirrhosis of liver, and Neuropathy   Today, patient states she is doing well. Her father passed awar recently, she states she is trying to handle her grief by focusing on the positives in her life and her church. Her biggest concern today is preventing falls, since her legs have been unstable since her stroke.   She has no immediate family in the area, her daughter lives in [place] and her son lives in [place]. She lives with her boyfriend. She attends a Risk [employer] in [place].   She lives a sedentary lifestyle, mainly stays in house and only leaves only for doctor visits. When she is not going to appointments, she spends her time cooking, watching TV, and reading a verse of the bible daily. She has not been reading as much as she used to as she has not been as motivated to start. She does not get much formal activity, but wants to get into the [facility] so she can swim as walking is challenging for her.   Office Visits: 10/30/19: Patient presented to Dr. Jerilee Hoh for Dysuria. No medication changes made.   Consult Visit: 08/08/19: Patient presented to Frann Rider, NP for follow-up. Cyclobenzaprine, diclofenac, hydralazine, metformin, prednisone, tramadol stopped (patient not taking). Lisinopril changed to 20 mg daily.  07/31/19: Patient presented to Dr. Katy Fitch (Ophthalmology) for cataract follow-up.  07/16/19: Patient presented to Dr. Zollie Scale (Hepatology) for Cirrhosis of liver.  07/12/19:  Patient presented to Dr. Margarito Liner (Optometry) for follow-up.   No Known Allergies  Medications: Outpatient Encounter Medications as of 12/26/2019  Medication Sig  . amLODipine (NORVASC) 10 MG tablet TAKE 1 TABLET BY MOUTH EVERY DAY  . ASPIRIN LOW DOSE 81 MG EC tablet TAKE 1 TABLET BY MOUTH EVERY DAY  . Cholecalciferol 50 MCG (2000 UT) CAPS Vitamin D3 50 mcg (2,000 unit) capsule  Take 1 capsule every day by oral route.  . cyanocobalamin 1000 MCG tablet Vitamin B-12  1,000 mcg tablet  Take 1 tablet every day by oral route.  . hydrALAZINE (APRESOLINE) 25 MG tablet Take 25 mg by mouth daily.  . hydrochlorothiazide (HYDRODIURIL) 25 MG tablet TAKE 1 TABLET BY MOUTH EVERY DAY  . lisinopril (ZESTRIL) 20 MG tablet TAKE 1 TABLET BY MOUTH EVERY DAY  . meloxicam (MOBIC) 7.5 MG tablet Take 1 tablet (7.5 mg total) by mouth daily.  . metFORMIN (GLUCOPHAGE) 1000 MG tablet Take 500 mg by mouth daily with breakfast.   . REPATHA SURECLICK 053 MG/ML SOAJ INJECT 140 MG INTO THE SKIN EVERY 14 (FOURTEEN) DAYS.  [name] Kitchen Accu-Chek FastClix Lancets MISC 1 each by Does not apply route daily.  [name] Kitchen ACCU-CHEK GUIDE test strip 1 EACH BY OTHER ROUTE DAILY. DX E11.9 E11.69  . ACCU-CHEK SOFTCLIX LANCETS lancets Accu-Chek Softclix Lancets  USE AS DIRECTED TO CHECK BLOOD SUGAR TWICE A DAY  . atorvastatin (LIPITOR) 80 MG tablet TAKE 1 TABLET BY MOUTH EVERY DAY (Patient not taking: Reported on 12/26/2019)  . Blood Glucose Monitoring Suppl (ACCU-CHEK GUIDE) w/Device KIT Please specify directions, refills and quantity  .  meloxicam (MOBIC) 15 MG tablet TAKE 1 TABLET BY MOUTH EVERY DAY  . oxyCODONE (ROXICODONE) 5 MG immediate release tablet Take 1 tablet (5 mg total) by mouth every 6 (six) hours as needed for severe pain. (Patient not taking: Reported on 12/26/2019)  . triamcinolone cream (KENALOG) 0.1 % APPLY TO AFFECTED AREA TWICE A DAY (Patient not taking: Reported on 12/26/2019)   No facility-administered encounter medications on file  as of 12/26/2019.     Current Diagnosis/Assessment:  SDOH Interventions     Most Recent Value  SDOH Interventions  Financial Strain Interventions Intervention Not Indicated  Transportation Interventions Intervention Not Indicated      Goals Addressed            This Visit's Progress   . Chronic Care Management       CARE PLAN ENTRY  Current Barriers:  . Chronic Disease Management support, education, and care coordination needs related to Hypertension, Hyperlipidemia, Diabetes and History of stroke, Cirrhosis of liver, and Neuropathy    Hypertension BP Readings from Last 3 Encounters:  08/08/19 (!) 174/87  05/10/19 (!) 189/82  04/04/19 (!) 167/94   . Pharmacist Clinical Goal(s): o Over the next 90 days, patient will work with PharmD and providers to achieve BP goal <130/80 . Current regimen:  . Amlodipine 10 mg 1 tablet daily . HCTZ 25 mg 1 tablet daily . Lisinopril 20 mg 1 tablet daily . Hydralazine 25 mg 1 tablet daily . Interventions: o Discussed low salt diet and exercising as tolerated extensively . Patient self care activities - Over the next 7 days, patient will: o Check blood pressure daily, document, and provide at future appointments o Ensure daily salt intake < 2300 mg/day  Hyperlipidemia Lab Results  Component Value Date/Time   LDLCALC 59 02/14/2019 10:08 AM   . Pharmacist Clinical Goal(s): o Over the next 90 days, patient will work with PharmD and providers to maintain LDL goal < 70 . Current regimen:  . Atorvastatin 80 mg 1 tablet daily . Repatha 140 mg/ml every 14 days . Interventions: o Discussed low cholesterol diet and exercising as tolerated extensively . Patient self care activities - Over the next 90 days, patient will: o Resume taking atorvastatin in addition to Repatha   Diabetes Lab Results  Component Value Date/Time   HGBA1C 5.8 (H) 08/02/2018 10:41 AM   HGBA1C 5.4 06/28/2016 04:49 AM   . Pharmacist Clinical Goal(s): o Over the  next 90 days, patient will work with PharmD and providers to maintain A1c goal <7% . Current regimen:  o Metformin 1000 mg 1/2 tablet daily  . Interventions: o Discussed carbohydrate counting and exercising as tolerated extensively . Patient self care activities - Over the next 90 days, patient will: o Check blood sugar when feeling symptoms of hypoglycemia , document, and provide at future appointments o Contact provider with any episodes of hypoglycemia  Medication management . Pharmacist Clinical Goal(s): o Over the next 90 days, patient will work with PharmD and providers to achieve optimal medication adherence . [facility] Interventions o Comprehensive medication review performed. o Utilize [facility] for medication synchronization, packaging and delivery o Verbal consent obtained for [facility] enhanced pharmacy services (medication synchronization, adherence packaging, delivery coordination). A medication sync plan was created to allow patient to get all medications delivered once every 30 to 90 days per patient preference. Patient understands they have freedom to choose pharmacy and clinical pharmacist will coordinate care between all prescribers and [facility].  [name] Kitchen  Patient self care activities - Over the next 90 days, patient will: o Focus on medication adherence by resuming atorvastatin  o Take medications as prescribed o Report any questions or concerns to PharmD and/or provider(s)      Diabetes   A1c goal <7%  Recent Relevant Labs: Lab Results  Component Value Date/Time   HGBA1C 5.8 (H) 08/02/2018 10:41 AM   HGBA1C 5.4 06/28/2016 04:49 AM   GFR 86.68 09/07/2018 01:21 PM   GFR 102.46 06/22/2018 09:01 AM   MICROALBUR 52.8 (H) 06/22/2018 09:01 AM    Last diabetic Eye exam: No results found for: HMDIABEYEEXA  Last diabetic Foot exam: No results found for: HMDIABFOOTEX   Checking BG: Never  Recent FBG Readings: n/a Recent pre-meal BG  readings: n/a Recent 2hr PP BG readings:  n/a Recent HS BG readings: n/a  Patient has failed these meds in past: n/a Patient is currently controlled on the following medications: [name] Kitchen Metformin 1000 mg  1/2 tab daily   We discussed: diet and exercise extensively. Patient watches sugar/carb intake.  Plan  Continue current medications  Recommend patient begin water aerobics at [facility] 15 minutes three times weekly.   Hypertension   BP goal is:  <130/80  Office blood pressures are  BP Readings from Last 3 Encounters:  08/08/19 (!) 174/87  05/10/19 (!) 189/82  04/04/19 (!) 167/94   CMP Latest Ref Rng & Units 02/14/2019 02/06/2019 12/10/2018  Glucose 70 - 99 mg/dL - 106(H) 126(H)  BUN 8 - 23 mg/dL - 15 23  Creatinine 0.44 - 1.00 mg/dL - 1.00 1.05(H)  Sodium 135 - 145 mmol/L - 140 141  Potassium 3.5 - 5.1 mmol/L - 4.6 4.2  Chloride 98 - 111 mmol/L - 104 106  CO2 22 - 32 mmol/L - 23 25  Calcium 8.9 - 10.3 mg/dL - 9.7 10.3  Total Protein 6.0 - 8.5 g/dL 7.1 7.8 -  Total Bilirubin 0.0 - 1.2 mg/dL 1.1 1.7(H) -  Alkaline Phos 39 - 117 IU/L 100 75 -  AST 0 - 40 IU/L 38 77(H) -  ALT 0 - 32 IU/L 32 47(H) -     Patient checks BP at home infrequently Patient home BP readings are ranging: n/a  Patient has failed these meds in the past: n/a Patient is currently uncontrolled on the following medications:  . Amlodipine 10 mg daily (needs refill)  . HCTZ 25 mg daily  . Lisinopril 20 mg daily  . Hydralazine 25 mg daily   We discussed diet and exercise extensively. Patient recently acquired wrist blood pressure device, but has not used it. Discussed proper monitoring technique. Patient has continued taking Hydralazine 25 mg daily (discontinued by  Neurology 08/08/19)  Plan  Patient to begin checking blood pressure daily and record. CPA to follow-up in one week to review results.  Would consider continuing Hydralazine for now given elevated blood pressures.  Recommend rechecking BMP at PCP office  visit.   Hyperlipidemia   LDL goal < 70  Lipid Panel     Component Value Date/Time   CHOL 122 02/14/2019 1008   TRIG 67 02/14/2019 1008   HDL 49 02/14/2019 1008   LDLCALC 59 02/14/2019 1008    Hepatic Function Latest Ref Rng & Units 02/14/2019 02/06/2019 06/22/2018  Total Protein 6.0 - 8.5 g/dL 7.1 7.8 7.7  Albumin 3.8 - 4.8 g/dL 4.4 4.0 4.2  AST 0 - 40 IU/L 38 77(H) 34  ALT 0 - 32 IU/L 32 47(H) 31  Alk Phosphatase  39 - 117 IU/L 100 75 89  Total Bilirubin 0.0 - 1.2 mg/dL 1.1 1.7(H) 1.2  Bilirubin, Direct 0.00 - 0.40 mg/dL 0.53(H) - -     The ASCVD Risk score Mikey Bussing DC Jr., et al., 2013) failed to calculate for the following reasons:   The patient has a prior MI or stroke diagnosis   Patient has failed these meds in past: n/a Patient is currently controlled on the following medications:  . Aspirin 81 mg daily  . Atorvastatin 80 mg daily (needs new rx)  . Repatha 140 mg q14 days (next due 12/30/19)   We discussed: Patient has not been taking atorvastatin. She thought it was stopped when she started on Repatha. Counseled patient that atorvastatin and Repatha work synergistically to control cholesterol and advised her to resume taking it. She states she would be willing but she would need a new Rx as she does not have her last refill of the atorvastatin.   Plan  Continue current medications  Resume Atorvastatin 80 mg daily  Recommend rechecking lipid panel at PCP office visit.   Chronic Pain   Patient has failed these meds in past: n/a Patient is currently controlled on the following medications:  [name] Kitchen Meloxicam 7.5 mg daily PRN (1-2x weekly) . Meloxicam 15 mg daily PRN (Not taking) . Oxycodone 4 mg PRN (Not taking)   We discussed:  Advised patient to avoid taking meloxicam doses together and to take with plenty of water and with a snack/meal due to DDI with aspirin.   Plan  Continue current medications   Misc / OTC   . Vitamin D 2000 units daily  . Vitamin B12 1000 mcg  daily   We discussed:  Patient asked if she should take a "Liver Detox" supplement. Advised against starting supplement due to history of cirrhosis and unknown nature of supplement.   Plan  Continue current medications   Vaccines   Reviewed and discussed patient's vaccination history.    Immunization History  Administered Date(s) Administered  . Influenza, High Dose Seasonal PF 06/22/2018  . Influenza,inj,Quad PF,6+ Mos 06/29/2016  . Td 08/03/2017    Plan    Medication Management   [facility]. Patient states there are multiple medications she is not taking as prescribed and that [facility].  Uses pill box? Yes  Plan  Utilize [facility] for medication synchronization, packaging and delivery  Verbal consent obtained for [facility] enhanced pharmacy services (medication synchronization, adherence packaging, delivery coordination). A medication sync plan was created to allow patient to get all medications delivered once every 30 to 90 days per patient preference. Patient understands they have freedom to choose pharmacy and clinical pharmacist will coordinate care between all prescribers and [facility].  Follow up:  1 week phone visit with CPA to review blood pressures 1 month phone visit with pharmacist  [facility] [place]

## 2019-12-26 ENCOUNTER — Ambulatory Visit: Payer: Medicare Other

## 2019-12-26 ENCOUNTER — Telehealth: Payer: Self-pay

## 2019-12-26 DIAGNOSIS — I10 Essential (primary) hypertension: Secondary | ICD-10-CM

## 2019-12-26 DIAGNOSIS — E1169 Type 2 diabetes mellitus with other specified complication: Secondary | ICD-10-CM

## 2019-12-26 DIAGNOSIS — E785 Hyperlipidemia, unspecified: Secondary | ICD-10-CM

## 2019-12-26 MED ORDER — ATORVASTATIN CALCIUM 80 MG PO TABS: 80.0000 mg | ORAL_TABLET | Freq: Every day | ORAL | 1 refills | Status: DC

## 2019-12-26 MED ORDER — AMLODIPINE BESYLATE 10 MG PO TABS: 10.0000 mg | ORAL_TABLET | Freq: Every day | ORAL | 1 refills | Status: DC

## 2019-12-26 NOTE — Addendum Note (Signed)
Addended by: Westley Hummer B on: 12/26/2019 04:26 PM   Modules accepted: Orders

## 2019-12-26 NOTE — Telephone Encounter (Signed)
Hello, I am a new clinical pharmacist supporting the [facility]. I was speaking with the patient today and she stated that she misplaced her prescriptions of Amlodipine and Atorvastatin and needs a new prescription so we may fill it for her.   Can we please send in a new Rx for Amlodipine 10 mg daily and Atorvastatin 80 mg daily to [facility]?  Thanks, Doristine Section Clinical Pharmacist [facility] [place]

## 2019-12-26 NOTE — Telephone Encounter (Signed)
Hello, Can you please place a referral to CCM for medication management for this patient?   Thanks,  Doristine Section Clinical Pharmacist [facility] [place]

## 2019-12-26 NOTE — Addendum Note (Signed)
Addended by: Westley Hummer B on: 12/26/2019 04:29 PM   Modules accepted: Orders

## 2019-12-26 NOTE — Telephone Encounter (Signed)
Refills sent

## 2020-01-01 ENCOUNTER — Telehealth: Payer: Self-pay

## 2020-01-01 NOTE — Progress Notes (Addendum)
Plan:  Recommend increasing Lisinopril to 40 mg daily  Continue Hydralazine 25 mg daily  Continue HCTZ 25 mg daily  Continue Amlodipine 10 mg daily   Recommend follow-up BMP   [facility] [place]

## 2020-01-01 NOTE — Progress Notes (Addendum)
01/01/2020 Name: [name] MRN: [id number] DOB: [birth year] Deborah Wise is a 69 y.o. year old female who is a primary care patient of Isaac Bliss, Rayford Halsted, MD. Reviewed chart prior to disease state call. Spoke with patient regarding BP  Recent Office Vitals: BP Readings from Last 3 Encounters:  08/08/19 (!) 174/87  05/10/19 (!) 189/82  04/04/19 (!) 167/94   Pulse Readings from Last 3 Encounters:  08/08/19 85  05/10/19 80  04/04/19 79    Wt Readings from Last 3 Encounters:  08/08/19 180 lb 3.2 oz (81.7 kg)  05/10/19 179 lb (81.2 kg)  03/20/19 179 lb 1.6 oz (81.2 kg)     Kidney Function Lab Results  Component Value Date/Time   CREATININE 1.00 02/06/2019 02:05 PM   CREATININE 1.05 (H) 12/10/2018 02:09 PM   CREATININE 0.8 12/31/2014 02:29 PM   GFR 86.68 09/07/2018 01:21 PM   GFRNONAA 58 (L) 02/06/2019 02:05 PM   GFRAA >60 02/06/2019 02:05 PM    BMP Latest Ref Rng & Units 02/06/2019 12/10/2018 09/07/2018  Glucose 70 - 99 mg/dL 106(H) 126(H) 85  BUN 8 - 23 mg/dL 15 23 27(H)  Creatinine 0.44 - 1.00 mg/dL 1.00 1.05(H) 0.80  Sodium 135 - 145 mmol/L 140 141 141  Potassium 3.5 - 5.1 mmol/L 4.6 4.2 4.9  Chloride 98 - 111 mmol/L 104 106 105  CO2 22 - 32 mmol/L 23 25 28   Calcium 8.9 - 10.3 mg/dL 9.7 10.3 10.1    . Current antihypertensive regimen:  o Amlodipine (norvasc) 10 mg tablet daily o Hydralazine 25 mg tablet daily o hydrochlorothiazide 25 tablet daily o Lisinopril 20 mg tablet daily . How often are you checking your Blood Pressure? infrequently  o Patients stated that she forgets to take her blood pressure, so she checks when she remember or when she feels bad. . Current home BP readings:  o 01/01/2020-@1 :35pm - 157/102  o 12/28/2019- Morning- 174/84 (before medication) o 12/28/2019- Evening- 149/84 . What recent interventions/DTPs have been made by any provider to improve Blood Pressure control since last CPP Visit: NoneID . Any recent hospitalizations  or ED visits since last visit with CPP? No . What diet changes have been made to improve Blood Pressure Control?  o Patient stated that she nibbles through out the day , she eats at 11:00a.m and at 5 or 6 pm. She cooks at home( Chicken , greens,cream potatoes, steak, with no salt added). . What exercise is being done to improve your Blood Pressure Control?  o As of right now patients not exercising she wants to start going to [facility] soon.  Adherence Review: Is the patient currently on ACE/ARB medication? Yes Does the patient have >5 day gap between last estimated fill dates? Yes   Min[place] Pharmacist Assistant [phone]

## 2020-01-02 ENCOUNTER — Ambulatory Visit: Payer: Medicare Other

## 2020-01-02 ENCOUNTER — Ambulatory Visit (HOSPITAL_COMMUNITY)
Admission: RE | Admit: 2020-01-02 | Discharge: 2020-01-02 | Disposition: A | Discharge status: Home / Self Care | Payer: Medicare Other | Source: Ambulatory Visit | Attending: Surgery | Admitting: Surgery

## 2020-01-02 ENCOUNTER — Other Ambulatory Visit: Payer: Self-pay | Admitting: *Deleted

## 2020-01-02 ENCOUNTER — Telehealth: Payer: Self-pay

## 2020-01-02 ENCOUNTER — Other Ambulatory Visit: Payer: Self-pay

## 2020-01-02 ENCOUNTER — Telehealth: Payer: Self-pay | Admitting: Neurology

## 2020-01-02 DIAGNOSIS — I6523 Occlusion and stenosis of bilateral carotid arteries: Secondary | ICD-10-CM

## 2020-01-02 MED ORDER — ACCU-CHEK SOFTCLIX LANCETS MISC: 0 refills | Status: DC

## 2020-01-02 MED ORDER — HYDROCHLOROTHIAZIDE 25 MG PO TABS: 25.0000 mg | ORAL_TABLET | Freq: Every day | ORAL | 1 refills | Status: DC

## 2020-01-02 MED ORDER — ACCU-CHEK GUIDE VI STRP: 1.0000 | ORAL_STRIP | Freq: Two times a day (BID) | 0 refills | Status: DC

## 2020-01-02 NOTE — Telephone Encounter (Signed)
[facility]/ Bessie send prescription refill REPATHA SURECLICK 898 MG/ML SOAJ to [facility].

## 2020-01-02 NOTE — Progress Notes (Addendum)
  Patient called stating she need a refill of hydrochlorothiazide 25 MG as she has one tablet left. She also needs a refill of ACC_Chek Guide test strip and ACC-Chek Softclix lancets.  Patient wanted to know why she has not received her Repatha. Message sent to Dr. Clydene Fake office requesting new Rx so it may be sent in.  She stated that she stopped taking atorvastatin 80mg  today due to the medication causing her to have a upset stomach on 01/01/2020.

## 2020-01-02 NOTE — Progress Notes (Signed)
Apolonio Schneiders,  Can you please send a new [facility] the [facility] so [facility] may fill them for the patient?  HCTZ 25 mg daily  ACCu-Chek Guide test strips ACC-Chek Softclix lancets.  Thanks, Doristine Section Clinical Pharmacist [facility] [place]

## 2020-01-02 NOTE — Progress Notes (Signed)
Deborah Wise,  Can you please send a new Rx [facility] the following [facility] so we may fill them for the patient?  HCTZ 25 mg daily  ACCu-Chek Guide test strips ACC-Chek Softclix lancets.  Thanks, Doristine Section Clinical Pharmacist [facility] [place]

## 2020-01-03 ENCOUNTER — Ambulatory Visit (INDEPENDENT_AMBULATORY_CARE_PROVIDER_SITE_OTHER): Payer: Medicare Other | Admitting: Physician Assistant

## 2020-01-03 ENCOUNTER — Ambulatory Visit (INDEPENDENT_AMBULATORY_CARE_PROVIDER_SITE_OTHER): Payer: Medicare Other | Admitting: Internal Medicine

## 2020-01-03 VITALS — BP 130/70 | HR 95 | Temp 98.3°F | Wt 169.8 lb

## 2020-01-03 VITALS — BP 130/70 | Resp 20 | Ht 65.0 in | Wt 169.0 lb

## 2020-01-03 DIAGNOSIS — I10 Essential (primary) hypertension: Secondary | ICD-10-CM

## 2020-01-03 DIAGNOSIS — E1169 Type 2 diabetes mellitus with other specified complication: Secondary | ICD-10-CM

## 2020-01-03 DIAGNOSIS — R0600 Dyspnea, unspecified: Secondary | ICD-10-CM

## 2020-01-03 DIAGNOSIS — E1149 Type 2 diabetes mellitus with other diabetic neurological complication: Secondary | ICD-10-CM

## 2020-01-03 DIAGNOSIS — I6523 Occlusion and stenosis of bilateral carotid arteries: Secondary | ICD-10-CM | POA: Diagnosis not present

## 2020-01-03 DIAGNOSIS — E785 Hyperlipidemia, unspecified: Secondary | ICD-10-CM

## 2020-01-03 DIAGNOSIS — R0609 Other forms of dyspnea: Secondary | ICD-10-CM

## 2020-01-03 LAB — POCT GLYCOSYLATED HEMOGLOBIN (HGB A1C): Hemoglobin A1C: 5.3 % (ref 4.0–5.6)

## 2020-01-03 NOTE — Telephone Encounter (Signed)
RX sent to [facility]  Pt aware

## 2020-01-03 NOTE — Telephone Encounter (Signed)
Pharmacy called stating they are waiting on this Rx so that they can go ahead and get it to the pt. Pt is in need of it today. Please advise.

## 2020-01-03 NOTE — Patient Instructions (Signed)
-  Nice seeing you today!!  -Will set you up for an echocardiogram and for referral to cardiology.  -If you develop CP or increasing shortness or breath, please get checked out at the Emergency Department.

## 2020-01-03 NOTE — Progress Notes (Signed)
Established Patient Office Visit     This visit occurred during the SARS-CoV-2 public health emergency.  Safety protocols were in place, including screening questions prior to the visit, additional usage of staff PPE, and extensive cleaning of exam room while observing appropriate contact time as indicated for disinfecting solutions.    CC/Reason for Visit: 50-monthfollow-up chronic conditions, discuss shortness of breath  HPI: Deborah SANDRAis a 69y.o. female who is coming in today for the above mentioned reasons. Past Medical History is significant for: CVA on January 2018, uncontrolled hypertension, well-controlled type 2 diabetes, hyperlipidemia, history of alcohol and polysubstance abuse including cocaine and marijuana. History of carotid artery disease followed by vascular surgery. 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 Thrombocytopenia due to splenomegaly.  Back in January she scheduled a phone visit for "breathing issues".  She was very audibly short of breath on the phone and so ED visit was recommended which she never followed through with.  Today she continues to complain of dyspnea.  It is worse when going up steps, walking or even talking.  She feels like she might have broken some ribs and attributes it to that.  She denies any chest pain, dizziness, claudication.  She has been very fatigued.   Past Medical/Surgical History: Past Medical History:  Diagnosis Date  . Anemia   . Arthritis   . Blood transfusion without reported diagnosis   . Carotid artery occlusion   . Cirrhosis ([place]   . Diabetes mellitus without complication ([place]   . Fibroid, uterine   . GERD (gastroesophageal reflux disease)   . Hepatitis    hep C  . Hypertension   . Stroke ([place]   . Thrombocytopenia ([place] 12/25/2014  . Urinary incontinence    Patient noticed mild and is not currently a significant problem    Past Surgical History:  Procedure Laterality  Date  . ANKLE FRACTURE SURGERY Right 1999   x 2, rod  . COLONOSCOPY    . IR GENERIC HISTORICAL  06/30/2016   IR ANGIO VERTEBRAL SEL SUBCLAVIAN INNOMINATE UNI R MOD SED 06/30/2016 SLuanne Bras MD [facility]  . IR GENERIC HISTORICAL  06/30/2016   IR ANGIO VERTEBRAL SEL VERTEBRAL UNI L MOD SED 06/30/2016 SLuanne Bras MD [facility]  . IR GENERIC HISTORICAL  06/30/2016   IR ANGIO INTRA EXTRACRAN SEL COM CAROTID INNOMINATE BILAT MOD SED 06/30/2016 SLuanne Bras MD [facility]  . MULTIPLE EXTRACTIONS WITH ALVEOLOPLASTY  10/25/2011   Procedure: MULTIPLE EXTRACION WITH ALVEOLOPLASTY;  Surgeon: SGae Bon DDS;  [facility]  Service: Oral Surgery;  Laterality: Bilateral;  Extract teeth numbers one, two, six, seven, eight, nine, ten, eleven, twelve, fifteen, sixteen, eighteen, nineteen, twenty-three, twenty-four, twenty-five, twenty-seven, thirty-two and alveoplasty.    Social History:  reports that she has never smoked. She has never used smokeless tobacco. She reports current alcohol use of about 1.0 standard drink of alcohol per week. She reports previous drug use. Frequency: 1.00 time per week. Drug: Cocaine.  Allergies: No Known Allergies  Family History:  Family History  Problem Relation Age of Onset  . Diabetes type II Mother   . Hypertension Mother   . Stroke Mother   . Cancer Father        patient does not know what type of cancer  . Diabetes Daughter   . Breast cancer Neg Hx   . Colon polyps Neg Hx   . Crohn's disease  Neg Hx   . Esophageal cancer Neg Hx   . Rectal cancer Neg Hx   . Stomach cancer Neg Hx   . Colon cancer Neg Hx      Current Outpatient Medications:  .  Accu-Chek FastClix Lancets MISC, 1 each by Does not apply route daily., Disp: 100 each, Rfl: 3 .  Accu-Chek Softclix Lancets lancets, USE AS DIRECTED TO CHECK BLOOD SUGAR TWICE A DAY, Disp: 100 each, Rfl: 0 .  amLODipine (NORVASC) 10 MG tablet, Take 1 tablet (10 mg total) by mouth  daily., Disp: 90 tablet, Rfl: 1 .  ASPIRIN LOW DOSE 81 MG EC tablet, TAKE 1 TABLET BY MOUTH EVERY DAY, Disp: 90 tablet, Rfl: 1 .  atorvastatin (LIPITOR) 80 MG tablet, Take 1 tablet (80 mg total) by mouth daily., Disp: 90 tablet, Rfl: 1 .  Blood Glucose Monitoring Suppl (ACCU-CHEK GUIDE) w/Device KIT, Please specify directions, refills and quantity, Disp: 1 kit, Rfl: 0 .  Cholecalciferol 50 MCG (2000 UT) CAPS, Vitamin D3 50 mcg (2,000 unit) capsule  Take 1 capsule every day by oral route., Disp: , Rfl:  .  cyanocobalamin 1000 MCG tablet, Vitamin B-12  1,000 mcg tablet  Take 1 tablet every day by oral route., Disp: , Rfl:  .  glucose blood (ACCU-CHEK GUIDE) test strip, 1 each by Other route in the morning and at bedtime. Dx E11.9, Disp: 100 strip, Rfl: 0 .  hydrALAZINE (APRESOLINE) 25 MG tablet, Take 25 mg by mouth daily., Disp: , Rfl:  .  hydrochlorothiazide (HYDRODIURIL) 25 MG tablet, Take 1 tablet (25 mg total) by mouth daily., Disp: 90 tablet, Rfl: 1 .  lisinopril (ZESTRIL) 20 MG tablet, TAKE 1 TABLET BY MOUTH EVERY DAY, Disp: 90 tablet, Rfl: 3 .  meloxicam (MOBIC) 15 MG tablet, TAKE 1 TABLET BY MOUTH EVERY DAY, Disp: 30 tablet, Rfl: 0 .  meloxicam (MOBIC) 7.5 MG tablet, Take 1 tablet (7.5 mg total) by mouth daily., Disp: 90 tablet, Rfl: 1 .  metFORMIN (GLUCOPHAGE) 1000 MG tablet, Take 500 mg by mouth daily with breakfast. , Disp: , Rfl:  .  oxyCODONE (ROXICODONE) 5 MG immediate release tablet, Take 1 tablet (5 mg total) by mouth every 6 (six) hours as needed for severe pain., Disp: 20 tablet, Rfl: 0 .  REPATHA SURECLICK 761 MG/ML SOAJ, INJECT 140 MG INTO THE SKIN EVERY 14 (FOURTEEN) DAYS., Disp: 2 pen, Rfl: 1 .  triamcinolone cream (KENALOG) 0.1 %, APPLY TO AFFECTED AREA TWICE A DAY, Disp: 30 g, Rfl: 2  Review of Systems:  Constitutional: Denies fever, chills, diaphoresis, appetite change. HEENT: Denies photophobia, eye pain, redness, hearing loss, ear pain, congestion, sore throat, rhinorrhea,  sneezing, mouth sores, trouble swallowing, neck pain, neck stiffness and tinnitus.   Respiratory: Denies cough, chest tightness,  and wheezing.   Cardiovascular: Denies chest pain, palpitations and leg swelling.  Gastrointestinal: Denies nausea, vomiting, abdominal pain, diarrhea, constipation, blood in stool and abdominal distention.  Genitourinary: Denies dysuria, urgency, frequency, hematuria, flank pain and difficulty urinating.  Endocrine: Denies: hot or cold intolerance, sweats, changes in hair or nails, polyuria, polydipsia. Musculoskeletal: Denies myalgias, back pain, joint swelling, arthralgias and gait problem.  Skin: Denies pallor, rash and wound.  Neurological: Denies dizziness, seizures, syncope, weakness, light-headedness, numbness and headaches.  Hematological: Denies adenopathy. Easy bruising, personal or family bleeding history  Psychiatric/Behavioral: Denies suicidal ideation, mood changes, confusion, nervousness, sleep disturbance and agitation    Physical Exam: Vitals:   01/03/20 1017  BP: (!) 130/70  Pulse: 95  Temp: 98.3 F (36.8 C)  TempSrc: Oral  SpO2: 97%  Weight: 169 lb 12.8 oz (77 kg)    Body mass index is 28.26 kg/m.   Constitutional: NAD, calm, comfortable Eyes: PERRL, lids and conjunctivae normal ENMT: Mucous membranes are moist.  Respiratory: clear to auscultation bilaterally, no wheezing, no crackles. Normal respiratory effort. No accessory muscle use.  Cardiovascular: Regular rate and rhythm, no murmurs but I do believe I auscultate an S4 gallop. No extremity edema.   Neurologic: Grossly intact and nonfocal Psychiatric: Normal judgment and insight. Alert and oriented x 3. Normal mood.    Impression and Plan:  Type 2 diabetes mellitus with other neurologic complication, without long-term current use of insulin ([place])  -Well-controlled with an A1c of 5.3 today.  DOE (dyspnea on exertion)/Extreme Fatigue -I am very concerned that this could  indeed be an anginal equivalent. -She has multiple CAD risk factors including diabetes, hypertension, hyperlipidemia and prior cocaine use. -EKG done in office today and interpreted by myself shows normal sinus rhythm at a rate of 96, right axis deviation, what appears to be an old anteroseptal infarct, there seems to be biventricular hypertrophy and some interventricular conduction delay, no signs of acute ischemia. -I will arrange for her to have an echocardiogram and follow-up with cardiology.  I believe she needs to be studied further.  Hyperlipidemia associated with type 2 diabetes mellitus ([place]) -Last LDL in February was above goal at 125, Lipitor was increased to maximum dose at that time, she is to check.  Malignant hypertension -Well-controlled today.    Patient Instructions  -Nice seeing you today!!  -Will set you up for an echocardiogram and for referral to cardiology.  -If you develop CP or increasing shortness or breath, please get checked out at the Emergency Department.     [name], MD [facility] at [facility]

## 2020-01-03 NOTE — Progress Notes (Signed)
Virtual Visit via Telephone Note   I connected with [name] on 01/03/2020 by telephone and verified that I was speaking with the correct person using two identifiers. Patient was located at home.  I am located at [facility] office.   The limitations of evaluation and management by telemedicine and the availability of in person appointments have been previously discussed with the patient and are documented in the patients chart. The patient expressed understanding and consented to proceed.  PCP: Isaac Bliss, Rayford Halsted, MD  Chief Complaint: follow up  History of Present Illness: Deborah Wise is a 69 y.o. female  who initially presented to the hospital in January 2018 with slurred speech and right-sided weakness.  Her UDS was positive for cocaine. She has a history of hep C and cirrhosis. Her MRI was consistent with a left brain stroke. Her CT angiogram revealed a tight right carotid artery stenosis. She went on to have a cerebral angiogram which revealed the stenosis in the right carotid artery was 60-65 percent. The patient was ultimately discharged the hospital.She also had intracranial 45mm aneurysms which are being managed medically.  At her last visit in December 2020, she continued to have some slurred speech and right sided weakness but these were stable.  She had fallen 3x since August and was receiving PT for sciatica.  Her blood pressure was uncontrolled. She has hx of thrombocytopenia due to slenomegaly. Her hep C is followed by GI.   She has hx of DM that is well controlled.  She was also c/o shortness of breath and had a phone visit with her PCP and they had recommended ER visit, but she did not go.    She saw her PCP today and he feels this could indeed be an anginal equivalent as she has several risk factors including DM, HTN, HLD and prior cocaine use.  She had an EKG in their office today that showed normal sinus rhythm at a rate of 96, right axis deviation,  what appears to be an old anteroseptal infarct, there seems to be biventricular hypertrophy and some interventricular conduction delay, no signs of acute ischemia.  He is arranging an echo and visit with cardiology.  I spoke with pt via telephone visit.  She states that she feels her speech is better and only on occasion does she have some slurred speech.   She denies any temporary blindness in either eye.  She states she has been having some floaters in the left eye.  She is seeing an eye doctor.  She states she has been having BLE weakness that is equal, which has caused her to fall.  She saw her PCP this am and it was felt she could be having some cardiac issues and has set her up for an echo and see MD.   She states that overall, she feels good today.  The pt is on a statin for cholesterol management.  The pt is on a daily aspirin.   Other AC:  none The pt is on ACE, hydralazine for hypertension.   The pt is diabetic.   Tobacco hx:  never   Past Medical History:  Diagnosis Date  . Anemia   . Arthritis   . Blood transfusion without reported diagnosis   . Carotid artery occlusion   . Cirrhosis ([place])   . Diabetes mellitus without complication ([place])   . Fibroid, uterine   . GERD (gastroesophageal reflux disease)   . Hepatitis    hep  C  . Hypertension   . Stroke ([place])   . Thrombocytopenia ([place]) 12/25/2014  . Urinary incontinence    Patient noticed mild and is not currently a significant problem    Past Surgical History:  Procedure Laterality Date  . ANKLE FRACTURE SURGERY Right 1999   x 2, rod  . COLONOSCOPY    . IR GENERIC HISTORICAL  06/30/2016   IR ANGIO VERTEBRAL SEL SUBCLAVIAN INNOMINATE UNI R MOD SED 06/30/2016 [name], MD [facility]  . IR GENERIC HISTORICAL  06/30/2016   IR ANGIO VERTEBRAL SEL VERTEBRAL UNI L MOD SED 06/30/2016 [name], MD [facility]  . IR GENERIC HISTORICAL  06/30/2016   IR ANGIO INTRA EXTRACRAN SEL COM CAROTID INNOMINATE BILAT MOD  SED 06/30/2016 [name], MD [facility]  . MULTIPLE EXTRACTIONS WITH ALVEOLOPLASTY  10/25/2011   Procedure: MULTIPLE EXTRACION WITH ALVEOLOPLASTY;  Surgeon: Gae Bon, DDS;  Location: [place];  Service: Oral Surgery;  Laterality: Bilateral;  Extract teeth numbers one, two, six, seven, eight, nine, ten, eleven, twelve, fifteen, sixteen, eighteen, nineteen, twenty-three, twenty-four, twenty-five, twenty-seven, thirty-two and alveoplasty.    shoulder  No outpatient medications have been marked as taking for the 01/03/20 encounter (Appointment) with [facility] PA.    12 system ROS was negative unless otherwise noted in HPI   Observations/Objective: Carotid duplex 01/02/2020: Right:  60-79% with significant increase since last exam (EDV 92) Left:  40-59%   Previous carotid duplex 05/10/2019: Right:  60-79% (EDV 67) Left:  40-59%  Assessment and Plan: 69 y.o. female with hx of bilateral carotid artery stenosis.    -pt remains asymptomatic from her bilateral carotid stenosis.  She does continue to have occasional slurred speech.  Her right sided weakness is still present.  She states she has bilateral lower extremity weakness that is equal and is not new and was seen by Neurology in March for this and felt it was likely multifactorial due to combination of underlying right peroneal neuropathy as well as prior right pontine stroke in January 2018.    Continue asa/statin/repatha  Follow Up Instructions:  We will have the pt return in 6 months for repeat carotid duplex with PA visit.  Discussed with her should she have any stroke sx that were discussed with pt she should go to the Emergency room or call 911.   I discussed the assessment and treatment plan with the patient. The patient was provided an opportunity to ask questions and all were answered. The patient agreed with the plan and demonstrated an understanding of the instructions.   The patient was advised to call back or seek an  in-person evaluation if the symptoms worsen or if the condition fails to improve as anticipated.  I spent 5-10 minutes with the patient via telephone encounter.   Signed, Leontine Locket, PA-C [facility] Office: [phone]  01/03/2020, 1:02 PM

## 2020-01-04 ENCOUNTER — Other Ambulatory Visit: Payer: Self-pay

## 2020-01-04 DIAGNOSIS — I6523 Occlusion and stenosis of bilateral carotid arteries: Secondary | ICD-10-CM

## 2020-01-10 ENCOUNTER — Ambulatory Visit: Payer: Medicare Other

## 2020-01-16 ENCOUNTER — Other Ambulatory Visit: Payer: Self-pay | Admitting: Nurse Practitioner

## 2020-01-16 DIAGNOSIS — K7469 Other cirrhosis of liver: Secondary | ICD-10-CM

## 2020-01-23 DIAGNOSIS — K7469 Other cirrhosis of liver: Secondary | ICD-10-CM | POA: Diagnosis not present

## 2020-01-24 ENCOUNTER — Telehealth (INDEPENDENT_AMBULATORY_CARE_PROVIDER_SITE_OTHER): Payer: Medicare Other | Admitting: Family Medicine

## 2020-01-24 ENCOUNTER — Ambulatory Visit
Admission: RE | Admit: 2020-01-24 | Discharge: 2020-01-24 | Disposition: A | Discharge status: Home / Self Care | Payer: Medicare Other | Source: Ambulatory Visit | Attending: Nurse Practitioner | Admitting: Nurse Practitioner

## 2020-01-24 ENCOUNTER — Telehealth: Payer: Self-pay | Admitting: Internal Medicine

## 2020-01-24 ENCOUNTER — Encounter: Payer: Self-pay | Admitting: Family Medicine

## 2020-01-24 ENCOUNTER — Other Ambulatory Visit: Payer: Self-pay

## 2020-01-24 DIAGNOSIS — R300 Dysuria: Secondary | ICD-10-CM | POA: Diagnosis not present

## 2020-01-24 DIAGNOSIS — K7469 Other cirrhosis of liver: Secondary | ICD-10-CM

## 2020-01-24 DIAGNOSIS — K7460 Unspecified cirrhosis of liver: Secondary | ICD-10-CM | POA: Diagnosis not present

## 2020-01-24 MED ORDER — NITROFURANTOIN MONOHYD MACRO 100 MG PO CAPS: 100.0000 mg | ORAL_CAPSULE | Freq: Two times a day (BID) | ORAL | 0 refills | Status: DC

## 2020-01-24 NOTE — Telephone Encounter (Signed)
Patient states someone called her niece and said she had a Urinary infection but that no one had called her to tell her that.  She was inquiring as to what she needs to do.

## 2020-01-24 NOTE — Patient Instructions (Signed)
-  I sent the medication(s) we discussed to your pharmacy: Meds ordered this encounter  Medications  . nitrofurantoin, macrocrystal-monohydrate, (MACROBID) 100 MG capsule    Sig: Take 1 capsule (100 mg total) by mouth 2 (two) times daily.    Dispense:  14 capsule    Refill:  0     I hope you are feeling better soon! Seek in person care promptly if your symptoms worsen, new concerns arise or you are not improving with treatment.

## 2020-01-24 NOTE — Telephone Encounter (Signed)
I cannot see anything regarding a UTI in her chart.Deborah KitchenMarland Wise

## 2020-01-24 NOTE — Telephone Encounter (Signed)
Spoke with patient and an appointment scheduled with Dr Maudie Mercury.

## 2020-01-24 NOTE — Progress Notes (Signed)
Virtual Visit via Telephone Note  I connected with Deborah Wise on 01/24/20 at  5:40 PM EDT by telephone and verified that I am speaking with the correct person using two identifiers.   I discussed the limitations, risks, security and privacy concerns of performing an evaluation and management service by telephone and the availability of in person appointments. I also discussed with the patient that there may be a patient responsible charge related to this service. The patient expressed understanding and agreed to proceed.  Location patient: home,  Location provider: work or home office Participants present for the call: patient, provider Patient did not have a visit in the prior 7 days to address this/these issue(s).   History of Present Illness:   Acute visit for Dysuria: -on and off for some time, poor historian -symptoms include dysuria, burning with urination, urinary frequency, low back pain -denies fevers, hematuria, malaise, flank pain, NVD, vaginal symptoms or concerns for STI -she really feels she has a UTI and is requesting and abx   Observations/Objective: Patient sounds cheerful and well on the phone. I do not appreciate any SOB. Speech and thought processing are grossly intact. Patient reported vitals:  Assessment and Plan:  Dysuria  -we discussed possible serious and likely etiologies, options for evaluation and workup, limitations of telemedicine visit vs in person visit, treatment, treatment risks and precautions. Pt prefers to treat via telemedicine empirically rather then risking or undertaking an in person visit at this moment. She opted to try empiric treatment with macrobid 100mg  bid x 7 days. Denies kidney disease or abx allergies. Advised to seek prompt follow up in person care if worsening, new symptoms arise, or if is not improving with treatment.   Follow Up Instructions:   I did not refer this patient for an OV in the next 24 hours for  this/these issue(s).  I discussed the assessment and treatment plan with the patient. The patient was provided an opportunity to ask questions and all were answered. The patient agreed with the plan and demonstrated an understanding of the instructions.   The patient was advised to call back or seek an in-person evaluation if the symptoms worsen or if the condition fails to improve as anticipated.  I provided 18 minutes of non-face-to-face time during this encounter.   Lucretia Kern, DO

## 2020-01-29 ENCOUNTER — Telehealth: Payer: Self-pay

## 2020-01-29 NOTE — Progress Notes (Signed)
Spoke to patient to confirmed patient telephone appointment on 01/30/2020 for CCM at 1:00 PM with Junius Argyle the Clinical pharmacist.   Patient Verbalized understanding  [place] Pharmacist Assistant [phone]

## 2020-01-30 ENCOUNTER — Telehealth: Payer: Medicare Other

## 2020-01-30 ENCOUNTER — Telehealth: Payer: Self-pay

## 2020-01-30 DIAGNOSIS — I10 Essential (primary) hypertension: Secondary | ICD-10-CM

## 2020-01-30 DIAGNOSIS — E1169 Type 2 diabetes mellitus with other specified complication: Secondary | ICD-10-CM

## 2020-01-30 NOTE — Progress Notes (Addendum)
Chronic Care Management Pharmacy Assistant   Name: MATINA RODIER  MRN: [id number] DOB: 05/03/1951  Reason for Encounter: Medication Review/[place] adherence call  PCP : Isaac Bliss, Rayford Halsted, MD  Allergies:  No Known Allergies  Medications: Outpatient Encounter Medications as of 01/30/2020  Medication Sig  . Accu-Chek FastClix Lancets MISC 1 each by Does not apply route daily.  . Accu-Chek Softclix Lancets lancets USE AS DIRECTED TO CHECK BLOOD SUGAR TWICE A DAY  . amLODipine (NORVASC) 10 MG tablet Take 1 tablet (10 mg total) by mouth daily.  . ASPIRIN LOW DOSE 81 MG EC tablet TAKE 1 TABLET BY MOUTH EVERY DAY  . atorvastatin (LIPITOR) 80 MG tablet Take 1 tablet (80 mg total) by mouth daily.  . Blood Glucose Monitoring Suppl (ACCU-CHEK GUIDE) w/Device KIT Please specify directions, refills and quantity  . Cholecalciferol 50 MCG (2000 UT) CAPS Vitamin D3 50 mcg (2,000 unit) capsule  Take 1 capsule every day by oral route.  . cyanocobalamin 1000 MCG tablet Vitamin B-12  1,000 mcg tablet  Take 1 tablet every day by oral route.  [name] Kitchen glucose blood (ACCU-CHEK GUIDE) test strip 1 each by Other route in the morning and at bedtime. Dx E11.9  . hydrALAZINE (APRESOLINE) 25 MG tablet Take 25 mg by mouth daily.  . hydrochlorothiazide (HYDRODIURIL) 25 MG tablet Take 1 tablet (25 mg total) by mouth daily.  [name] Kitchen lisinopril (ZESTRIL) 20 MG tablet TAKE 1 TABLET BY MOUTH EVERY DAY  . meloxicam (MOBIC) 15 MG tablet TAKE 1 TABLET BY MOUTH EVERY DAY  . meloxicam (MOBIC) 7.5 MG tablet Take 1 tablet (7.5 mg total) by mouth daily.  . metFORMIN (GLUCOPHAGE) 1000 MG tablet Take 500 mg by mouth daily with breakfast.   . nitrofurantoin, macrocrystal-monohydrate, (MACROBID) 100 MG capsule Take 1 capsule (100 mg total) by mouth 2 (two) times daily.  [name] Kitchen REPATHA SURECLICK 829 MG/ML SOAJ INJECT 140 MG INTO THE SKIN EVERY 14 (FOURTEEN) DAYS.  [name] Kitchen triamcinolone cream (KENALOG) 0.1 % APPLY TO AFFECTED AREA TWICE A  DAY   No facility-administered encounter medications on file as of 01/30/2020.    Current Diagnosis: Patient Active Problem List   Diagnosis Date Noted  . Chronic arthropathy 11/07/2019  . Hallux limitus of right foot 11/07/2019  . Diabetic neuropathy ([place]) 04/04/2019  . Cirrhosis of liver ([place]) 06/22/2018  . Chronic hepatitis ([place]) 06/22/2018  . Hyperlipidemia associated with type 2 diabetes mellitus ([place]) 06/22/2018  . Carotid artery disease ([place]) 06/22/2018  . 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[place]) 07/02/2016  . Aneurysm of middle cerebral artery   . Cerebrovascular accident (CVA) due to thrombosis of left vertebral artery ([place]) 06/28/2016  . Cocaine abuse ([place])   . Stroke (cerebrum) ([place]) 06/27/2016  . Microcytic anemia 12/27/2014  . Thrombocytopenia ([place]) 12/25/2014  . Hypoglycemia 07/21/2013  . Diabetes mellitus ([place]) 07/21/2013  . 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 Liver disease 07/21/2013  . Alcohol abuse 07/21/2013   Follow-Up:  Pharmacist Review   Called patient and discussed medication adherence with patient, no issues at this time with current medication.Patient stated started nitrofurantoin, macrocrystal-monohydrate, (MACROBID) 100 MG capsule twice Daily. Patient stated since she started Macrobid she has had a huge amount of relief.  Patient stated she has a loss of appetite that has been occurring for three weeks.She denies nausea, and vomiting.She reports drinking water. Patient states she only  had one bite of a big mac from [place] today and hasn't ate since. Patient stated she ate two pieces  of fried fish on Friday.  Patient denies ED visit since her last CPP follow up.  Patient denies any side effects with her medication. Patient denies any problems with her current pharmacy   Bessie [place] Pharmacist Assistant [phone]

## 2020-01-31 ENCOUNTER — Telehealth: Payer: Self-pay | Admitting: Internal Medicine

## 2020-01-31 NOTE — Telephone Encounter (Signed)
Deborah Wise (on DPR) wants to know the results of the pt's liver lab and wondering if she has cancer?   She is aware that PCP is on vaca this week  Deborah Wise can be reached at [phone]

## 2020-02-05 ENCOUNTER — Telehealth: Payer: Self-pay | Admitting: *Deleted

## 2020-02-05 MED ORDER — METFORMIN HCL 1000 MG PO TABS: 500.0000 mg | ORAL_TABLET | Freq: Every day | ORAL | 1 refills | Status: DC

## 2020-02-05 NOTE — Telephone Encounter (Signed)
Refill sent.

## 2020-02-05 NOTE — Telephone Encounter (Signed)
-----   Message from Germaine Pomfret, [facility] sent at 02/05/2020 12:32 PM EDT ----- Regarding: Refill Request Apolonio Schneiders, Patient is requesting a refill of her Metformin 1000 mg. Can you please send in a refill of that medication to [facility] so we can fill that for her?  Thanks, Doristine Section Clinical Pharmacist [facility] [place]

## 2020-02-06 NOTE — Telephone Encounter (Signed)
These labs were ordered by Dr. Leonie Man and he has already commented on the results via [place].

## 2020-02-06 NOTE — Telephone Encounter (Signed)
Spoke with patient and gave her Dr Clydene Fake phone number

## 2020-02-08 NOTE — Progress Notes (Signed)
Encounter opened in error

## 2020-02-09 ENCOUNTER — Other Ambulatory Visit: Payer: Self-pay | Admitting: Adult Health

## 2020-02-14 ENCOUNTER — Telehealth: Payer: Self-pay

## 2020-02-14 DIAGNOSIS — E1169 Type 2 diabetes mellitus with other specified complication: Secondary | ICD-10-CM

## 2020-02-14 DIAGNOSIS — E1149 Type 2 diabetes mellitus with other diabetic neurological complication: Secondary | ICD-10-CM

## 2020-02-14 DIAGNOSIS — S92212A Displaced fracture of cuboid bone of left foot, initial encounter for closed fracture: Secondary | ICD-10-CM | POA: Diagnosis not present

## 2020-02-14 NOTE — Progress Notes (Addendum)
Chronic Care Management Pharmacy Assistant   Name: ALIANIS TRIMMER  MRN: [id number] DOB: [birth year]  Reason for Encounter: Medication Review   PCP : Isaac Bliss, Rayford Halsted, MD  Allergies:  No Known Allergies  Medications: Outpatient Encounter Medications as of 02/14/2020  Medication Sig  . Accu-Chek FastClix Lancets MISC 1 each by Does not apply route daily.  . Accu-Chek Softclix Lancets lancets USE AS DIRECTED TO CHECK BLOOD SUGAR TWICE A DAY  . amLODipine (NORVASC) 10 MG tablet Take 1 tablet (10 mg total) by mouth daily.  . ASPIRIN LOW DOSE 81 MG EC tablet TAKE 1 TABLET BY MOUTH EVERY DAY  . atorvastatin (LIPITOR) 80 MG tablet Take 1 tablet (80 mg total) by mouth daily.  . Blood Glucose Monitoring Suppl (ACCU-CHEK GUIDE) w/Device KIT Please specify directions, refills and quantity  . Cholecalciferol 50 MCG (2000 UT) CAPS Vitamin D3 50 mcg (2,000 unit) capsule  Take 1 capsule every day by oral route.  . cyanocobalamin 1000 MCG tablet Vitamin B-12  1,000 mcg tablet  Take 1 tablet every day by oral route.  . Evolocumab (REPATHA SURECLICK) 250 MG/ML SOAJ Inject 140 mg into the skin every 14 (fourteen) days. Must be seen for further refills. Call [phone] to schedule.  [name] Kitchen glucose blood (ACCU-CHEK GUIDE) test strip 1 each by Other route in the morning and at bedtime. Dx E11.9  . hydrALAZINE (APRESOLINE) 25 MG tablet Take 25 mg by mouth daily.  . hydrochlorothiazide (HYDRODIURIL) 25 MG tablet Take 1 tablet (25 mg total) by mouth daily.  [name] Kitchen lisinopril (ZESTRIL) 20 MG tablet TAKE 1 TABLET BY MOUTH EVERY DAY  . meloxicam (MOBIC) 15 MG tablet TAKE 1 TABLET BY MOUTH EVERY DAY  . meloxicam (MOBIC) 7.5 MG tablet Take 1 tablet (7.5 mg total) by mouth daily.  . metFORMIN (GLUCOPHAGE) 1000 MG tablet Take 0.5 tablets (500 mg total) by mouth daily with breakfast.  . nitrofurantoin, macrocrystal-monohydrate, (MACROBID) 100 MG capsule Take 1 capsule (100 mg total) by mouth 2 (two) times  daily.  [name] Kitchen triamcinolone cream (KENALOG) 0.1 % APPLY TO AFFECTED AREA TWICE A DAY   No facility-administered encounter medications on file as of 02/14/2020.    Current Diagnosis: Patient Active Problem List   Diagnosis Date Noted  . Chronic arthropathy 11/07/2019  . Hallux limitus of right foot 11/07/2019  . Diabetic neuropathy ([place]) 04/04/2019  . Cirrhosis of liver ([place]) 06/22/2018  . Chronic hepatitis ([place]) 06/22/2018  . Hyperlipidemia associated with type 2 diabetes mellitus ([place]) 06/22/2018  . Carotid artery disease ([place]) 06/22/2018  . 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[place]) 07/02/2016  . Aneurysm of middle cerebral artery   . Cerebrovascular accident (CVA) due to thrombosis of left vertebral artery ([place]) 06/28/2016  . Cocaine abuse ([place])   . Stroke (cerebrum) ([place]) 06/27/2016  . Microcytic anemia 12/27/2014  . Thrombocytopenia ([place]) 12/25/2014  . Hypoglycemia 07/21/2013  . Diabetes mellitus ([place]) 07/21/2013  . 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 Liver disease 07/21/2013  . Alcohol abuse 07/21/2013    Goals Addressed   None     Follow-Up:  Pharmacist Review   Reviewed chart for medication changes ahead of medication coordination call.  01/03/20: Patient presented to Dr. Jerilee Hoh 01/24/20: Patient presented to Dr. Maudie Mercury for dysuria. Started on Macrobid   BP Readings from Last 3 Encounters:  01/03/20 (!) 130/70  01/03/20 (!) 130/70  08/08/19 (!) 174/87    Lab Results  Component Value Date   HGBA1C 5.3 01/03/2020  Patient obtains medications through Adherence Packaging  30 Days   Last adherence delivery included: (medication name and frequency)  None ID Patient declined (meds) last month due to PRN use/additional supply on hand.  None ID  Patient is due for next adherence delivery on: 02/21/2020. Called patient and reviewed medications and coordinated delivery.  This delivery to include:   ACCU-Check Softclix Lancets  Amlodipine 10 mg Tablet Daily-  Breakfast  Aspirin 81 mg Tablet Daily- Breakfast  Atorvastatin 80 mg Tablet Daily -Breakfast  Vitamin D3 MCG 1000-unit capsule Daily- Breakfast  Vitamin B-12 Daily 1000 MCG Tablet - Breakfast  Repatha Sureclick 254 MG/ML Injection- Every 14 Days  Hydralazine 25 mg Tablet Daily -Breakfast  Hydrochlorothiazide 25 mg Tablet Daily- Breakfast  Lisinopril 20 mg Tablet Daily-Breakfast  Meloxicam 15 mg Daily Tablet- Breakfast  Metformin 500 mg Daily- Breakfast  Triamcinolone cream -PRN  Patient requests refill of Macrobid. Counseled patient to set up acute visit to evaluate dysuria symptoms.   Patient declined the following medications (meds) due to (reason)  None ID  Patient needs refills for Hydralazine, ACC-check Softclix Lancets,.  Confirmed delivery date of 02/21/2020, advised patient that pharmacy will contact them the morning of delivery.  [place] Pharmacist Assistant [phone]

## 2020-02-18 DIAGNOSIS — M79672 Pain in left foot: Secondary | ICD-10-CM | POA: Diagnosis not present

## 2020-02-18 DIAGNOSIS — M7672 Peroneal tendinitis, left leg: Secondary | ICD-10-CM | POA: Diagnosis not present

## 2020-02-18 DIAGNOSIS — S93402A Sprain of unspecified ligament of left ankle, initial encounter: Secondary | ICD-10-CM | POA: Diagnosis not present

## 2020-02-19 ENCOUNTER — Telehealth: Payer: Self-pay

## 2020-02-19 NOTE — Progress Notes (Signed)
Spoke to patient to informed her per [facility] Clinical Pharmacist Dr. Jerilee Hoh does not want her to take hydralazine for her blood pressure at this time.  Patient Verbalized understanding   Anderson [place] Clinical Pharmacist Assistant [phone]

## 2020-02-20 ENCOUNTER — Encounter: Payer: Self-pay | Admitting: Adult Health

## 2020-02-20 ENCOUNTER — Other Ambulatory Visit: Payer: Self-pay

## 2020-02-20 ENCOUNTER — Ambulatory Visit (INDEPENDENT_AMBULATORY_CARE_PROVIDER_SITE_OTHER): Payer: Medicare Other | Admitting: Adult Health

## 2020-02-20 VITALS — BP 140/88 | HR 87 | Ht 65.0 in | Wt 168.0 lb

## 2020-02-20 DIAGNOSIS — E785 Hyperlipidemia, unspecified: Secondary | ICD-10-CM | POA: Diagnosis not present

## 2020-02-20 DIAGNOSIS — Z8673 Personal history of transient ischemic attack (TIA), and cerebral infarction without residual deficits: Secondary | ICD-10-CM | POA: Diagnosis not present

## 2020-02-20 DIAGNOSIS — I6521 Occlusion and stenosis of right carotid artery: Secondary | ICD-10-CM | POA: Diagnosis not present

## 2020-02-20 DIAGNOSIS — E1149 Type 2 diabetes mellitus with other diabetic neurological complication: Secondary | ICD-10-CM

## 2020-02-20 DIAGNOSIS — I10 Essential (primary) hypertension: Secondary | ICD-10-CM

## 2020-02-20 DIAGNOSIS — G5731 Lesion of lateral popliteal nerve, right lower limb: Secondary | ICD-10-CM

## 2020-02-20 NOTE — Progress Notes (Signed)
[facility] [address] [place]. [place] [address] [phone]       OFFICE FOLLOW UP NOTE  [name] Date of Birth:  [birth year] Medical Record Number:  [id number]   Referring MD: Dr. Jerilee Hoh  Reason for Referral: Falls and balance issues  Chief complaint: Chief Complaint  Patient presents with  . Follow-up    F/U for neuropathy. States she has been doing ok since last visit.   [name] Kitchen room 5    alone      HPI:   Today, 02/18/2020, [name] returns for 6-monthfollow-up  Right peroneal neuropathy has been stable without worsening.  She does report a couple falls since prior visit with a recent fall last week with patient reporting " my whole body went weak and I wasn't able to get myself up without help".  PCP is currently working her up for cardiac condition  History of stroke without new stroke/TIA symptoms remaining on aspirin and Repatha for secondary stroke prevention without side effects.  She apparently self discontinued atorvastatin over 1 month ago due to causing nausea.  Blood pressure today 140/88.  Glucose levels not routinely monitored at home.  No further concerns at this time    History provided for reference purposes only Update 08/08/2019 JM: [name] a 69year old female who is being seen today, 08/08/2019, for routine follow-up. 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  She has not had any additional falls.  She has been stable from a stroke standpoint with continuation of aspirin 81 mg daily, atorvastatin 80 mg daily and Repatha without side effects for secondary stroke prevention.  Lipid panel obtained after prior visit with LDL 59.  Blood pressure today 174/87 - hasn't taken medications yet today as she normally takes around 12pm.  Patient reports typically at home SBPs 140s. No further neurological concerns at  this time.  Update 02/14/2019 Dr. SLeonie Man; she returns for follow-up after last visit 6 months ago.  Patient states about a week or 10 days ago she twisted her back when she stood up and had sudden onset of severe muscle tightness in cramp-like pain in the right posterior back extending to the flank and upper abdomen.  She in fact was seen in the ER on 02/06/2019 for this and thought to have a muscle spasm.  She had an ultrasound of the right upper quadrant which showed no evidence of cholecystitis or any acute hepatic abnormality.  Chest x-ray showed no evidence of pneumonia.  Patient has been started on muscle relaxant Flexeril as well as Mobic and tramadol as needed.  She states her medicines are not obtaining substantial relief.  She did undergo EMG nerve conduction study by Dr. WJannifer Franklinon 11/13/2018 which showed chronic right peroneal entrapment neuropathy of the right fibular head.  No evidence of generalized peripheral neuropathy or radiculopathy was found.  Patient denies habitual squatting or recurrent injuries to her right knee.  She however does admit that she sleeps on her side mostly on the right side.  She also had a follow-up carotid ultrasound in 11/06/2018 which showed moderate for 40 to 59% right ICA and 1-39% left ICA stenosis.  Lipid profile on 08/02/2018 showed LDL cholesterol of 125 and hemoglobin A1c of 5.8.  Patient was started on Repatha injections 140 mg every 2 weeks and has been tolerating it well without injection site reactions  or other side effects.  She however remains on Lipitor 80 mg as well.  She has not had any follow-up lipid profile checked.    Initial consult visit 08/02/2018 Dr. Leonie Man: [name] is a 69 year old African-American lady seen today for office consultation visit for frequent falls.  History is obtained from the patient, review of electronic medical records and have personally reviewed imaging films where available. She states that she has had a couple of falls since  January.  She states the falls are minor they were all unprovoked.  She had no time to catch herself.  She did not lose consciousness.  She bruised her knee.  She was unable to get up without assistance.  She denies any significant pain in the back, radicular pain or pain in the feet.  She does have chronic numbness in her right foot.  She is has remote history of stroke in January 2018 when she had a right paramedian pontine lacunar infarct.  She did have some left leg incoordination but she did improve.  She denies any previous back surgeries.  She denies any lack of bowel or bladder control.  She does admit that she cannot walk far and has to sit down because her legs hurt.  She has not had any follow-up neurovascular studies done since her stroke.  She did have a cerebral catheter angiogram on 06/30/2016 by Dr. 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  She was subsequently followed at vascular surgery clinic and last carotid ultrasound on 05/26/2018 had shown stable 60 to 79% right ICA stenosis.  She has not had any recurrent stroke or TIA symptoms.  She states she is on aspirin and tolerating it well without bleeding or bruising.  Her blood pressure usually is better controlled but today it is elevated in office at 156/85.  She is unable to tell me when she had her last lipid profile or hemoglobin A1c checked.  She does have diabetes and states that her control is adequate.  She has had some minor paresthesias in the feet and does not feel that this is getting worse.     ROS:   14 system review of systems is positive for those listed in HPI and all other systems negative  PMH:  Past Medical History:  Diagnosis Date  . Anemia   . Arthritis   . Blood transfusion without reported diagnosis   . Carotid artery occlusion   . Cirrhosis ([place])   . Diabetes mellitus without complication ([place])   .  Fibroid, uterine   . GERD (gastroesophageal reflux disease)   . Hepatitis    hep C  . Hypertension   . Stroke ([place])   . Thrombocytopenia ([place]) 12/25/2014  . Urinary incontinence    Patient noticed mild and is not currently a significant problem    Social History:  Social History   Socioeconomic History  . Marital status: Legally Separated    Spouse name: Not on file  . Number of children: 3  . Years of education: Not on file  . Highest education level: Not on file  Occupational History  . Occupation: disabled  Tobacco Use  . Smoking status: Never Smoker  . Smokeless tobacco: Never Used  Vaping Use  . Vaping Use: Never used  Substance and Sexual Activity  . Alcohol use: Yes    Alcohol/week: 1.0 standard drink  Types: 1 Cans of beer per week    Comment: 1/2 bottle of wine and some beer a day  . Drug use: Not Currently    Frequency: 1.0 times per week    Types: Cocaine    Comment: last use cocaine last year 2018  . Sexual activity: Not Currently  Other Topics Concern  . Not on file  Social History Narrative  . Not on file   Social Determinants of Health   Financial Resource Strain: Low Risk   . Difficulty of Paying Living Expenses: Not hard at all  Food Insecurity:   . Worried About Charity fundraiser in the Last Year: Not on file  . Ran Out of Food in the Last Year: Not on file  Transportation Needs: No Transportation Needs  . Lack of Transportation (Medical): No  . Lack of Transportation (Non-Medical): No  Physical Activity:   . Days of Exercise per Week: Not on file  . Minutes of Exercise per Session: Not on file  Stress:   . Feeling of Stress : Not on file  Social Connections:   . Frequency of Communication with Friends and Family: Not on file  . Frequency of Social Gatherings with Friends and Family: Not on file  . Attends Religious Services: Not on file  . Active Member of Clubs or Organizations: Not on file  . Attends Archivist Meetings:  Not on file  . Marital Status: Not on file  Intimate Partner Violence:   . Fear of Current or Ex-Partner: Not on file  . Emotionally Abused: Not on file  . Physically Abused: Not on file  . Sexually Abused: Not on file    Medications:   Current Outpatient Medications on File Prior to Visit  Medication Sig Dispense Refill  . Accu-Chek FastClix Lancets MISC 1 each by Does not apply route daily. 100 each 3  . Accu-Chek Softclix Lancets lancets USE AS DIRECTED TO CHECK BLOOD SUGAR TWICE A DAY 100 each 0  . amLODipine (NORVASC) 10 MG tablet Take 1 tablet (10 mg total) by mouth daily. 90 tablet 1  . ASPIRIN LOW DOSE 81 MG EC tablet TAKE 1 TABLET BY MOUTH EVERY DAY 90 tablet 1  . atorvastatin (LIPITOR) 80 MG tablet Take 1 tablet (80 mg total) by mouth daily. 90 tablet 1  . Blood Glucose Monitoring Suppl (ACCU-CHEK GUIDE) w/Device KIT Please specify directions, refills and quantity 1 kit 0  . Cholecalciferol 25 MCG (1000 UT) capsule Take 1,000 Units by mouth daily.     . cyanocobalamin 1000 MCG tablet Take 1,000 mcg by mouth daily.     . Evolocumab (REPATHA SURECLICK) 322 MG/ML SOAJ Inject 140 mg into the skin every 14 (fourteen) days. Must be seen for further refills. Call [phone] to schedule. 2 mL 0  . glucose blood (ACCU-CHEK GUIDE) test strip 1 each by Other route in the morning and at bedtime. Dx E11.9 100 strip 0  . hydrALAZINE (APRESOLINE) 25 MG tablet Take 25 mg by mouth daily.    . hydrochlorothiazide (HYDRODIURIL) 25 MG tablet Take 1 tablet (25 mg total) by mouth daily. 90 tablet 1  . lisinopril (ZESTRIL) 20 MG tablet TAKE 1 TABLET BY MOUTH EVERY DAY 90 tablet 3  . meloxicam (MOBIC) 15 MG tablet TAKE 1 TABLET BY MOUTH EVERY DAY 30 tablet 0  . meloxicam (MOBIC) 7.5 MG tablet Take 1 tablet (7.5 mg total) by mouth daily. 90 tablet 1  . metFORMIN (GLUCOPHAGE) 1000 MG tablet  Take 0.5 tablets (500 mg total) by mouth daily with breakfast. 90 tablet 1  . nitrofurantoin,  macrocrystal-monohydrate, (MACROBID) 100 MG capsule Take 1 capsule (100 mg total) by mouth 2 (two) times daily. 14 capsule 0  . triamcinolone cream (KENALOG) 0.1 % APPLY TO AFFECTED AREA TWICE A DAY 30 g 2   No current facility-administered medications on file prior to visit.    Allergies:  No Known Allergies  Physical Exam  Today's Vitals   02/20/20 0748  BP: 140/88  Pulse: 87  Weight: 168 lb (76.2 kg)  Height: 5' 5"  (1.651 m)   Body mass index is 27.96 kg/m.  General: Very Pleasant middle-aged African-American lady, seated, in no evident distress Head: head normocephalic and atraumatic.   Neck: supple with no carotid or supraclavicular bruits Cardiovascular: regular rate and rhythm, no murmurs Musculoskeletal: no deformity Skin:  no rash/petichiae Vascular:  Normal pulses all extremities  Neurologic Exam Mental Status: Awake and fully alert.  Mild dysarthria.  Oriented to place and time. Recent and remote memory intact. Attention span, concentration and fund of knowledge appropriate. Mood and affect appropriate.  Cranial Nerves: Pupils equal, briskly reactive to light. Extraocular movements full without nystagmus. Visual fields full to confrontation. Hearing intact. Facial sensation intact. Face, tongue, palate moves normally and symmetrically.  Motor: Normal bulk and tone. Normal strength in all tested extremity muscles except mild L>R hip flexor weakness. Sensory.: intact to touch , pinprick , position but diminished vibratory sensation over toes bilaterally.  Coordination: Rapid alternating movements normal in all extremities. Finger-to-nose and heel-to-shin performed accurately bilaterally. Gait and Station: Arises from chair without difficulty. Stance is normal. Gait demonstrates short stride steps with mild imbalance without use of assistive device Reflexes: 1+ and symmetric except ankle jerks are diminished. Toes downgoing.      ASSESSMENT: 69 year old African-American  lady with recent episodes of falls likely multifactorial due to combination of underlying right peroneal neuropathy which has been stable as well as prior right pontine stroke in January 2018 and asymptomatic moderate right carotid stenosis.  Vascular risk factors of diabetes, hypertension, hyperlipidemia, stroke and carotid stenosis.       PLAN: -Continue aspirin and Repatha for secondary stroke prevention  -repeat lipid panel and A1c today  -She self discontinued atorvastatin due to complaints of nausea -based on lipid panel, may consider adding additional agent to Repatha if indicated -Maintain strict control of hypertension with blood pressure goal below 130/90, lipids with LDL cholesterol goal below 70 mg percent and diabetes with hemoglobin A1c goal below 7.0 %.   -Continue yearly carotid ultrasound studies to follow her moderate right carotid stenosis with vascular surgery -recent carotid ultrasound 01/02/2020 stable R ICA 60 to 79% stenosis and L ICA 40 to 59% stenosis   Follow-up in 6 months or call earlier if needed   I spent 30 minutes of face-to-face and non-face-to-face time with patient.  This included previsit chart review, lab review, study review, order entry, electronic health record documentation, patient education/discussion regarding right peroneal neuropathy, history of prior stroke and residual deficits, routine carotid stenosis monitoring and review of recent carotid ultrasound, importance of managing stroke risk factors and indication for repeat lab work and answered all other questions to patient satisfaction   Frann Rider, AGNP-BC  [facility] [address] [place], [place] [address]  Phone [phone] Fax [phone] Note: This document was prepared with digital dictation and possible smart phrase technology. Any transcriptional errors that result from this process are  unintentional.

## 2020-02-20 NOTE — Patient Instructions (Signed)
Your Plan:  Continue aspirin 81 mg daily and Repatha for secondary stroke prevention We will check your cholesterol levels and A1c level today  Continue to follow with PCP for blood pressure and diabetes management as well as ongoing cardiac evaluation    Follow-up in 6 months or call earlier if needed     Thank you for coming to see [place] at [facility]. I hope we have been able to provide you high quality care today.  You may receive a patient satisfaction survey over the next few weeks. We would appreciate your feedback and comments so that we may continue to improve ourselves and the health of our patients.

## 2020-02-20 NOTE — Progress Notes (Signed)
I agree with the above plan 

## 2020-02-21 LAB — HEMOGLOBIN A1C
Est. average glucose Bld gHb Est-mCnc: 111 mg/dL
Hgb A1c MFr Bld: 5.5 % (ref 4.8–5.6)

## 2020-02-21 LAB — LIPID PANEL
Chol/HDL Ratio: 2.8 ratio (ref 0.0–4.4)
Cholesterol, Total: 146 mg/dL (ref 100–199)
HDL: 52 mg/dL (ref >39)
LDL Chol Calc (NIH): 73 mg/dL (ref 0–99)
Triglycerides: 116 mg/dL (ref 0–149)
VLDL Cholesterol Cal: 21 mg/dL (ref 5–40)

## 2020-02-25 ENCOUNTER — Telehealth: Payer: Self-pay | Admitting: Internal Medicine

## 2020-02-25 ENCOUNTER — Other Ambulatory Visit (HOSPITAL_COMMUNITY): Payer: Self-pay | Admitting: Cardiology

## 2020-02-25 ENCOUNTER — Other Ambulatory Visit: Payer: Self-pay

## 2020-02-25 ENCOUNTER — Encounter: Payer: Self-pay | Admitting: Cardiology

## 2020-02-25 ENCOUNTER — Ambulatory Visit (INDEPENDENT_AMBULATORY_CARE_PROVIDER_SITE_OTHER): Payer: Medicare Other | Admitting: Cardiology

## 2020-02-25 VITALS — BP 132/68 | HR 76 | Ht 65.0 in | Wt 166.0 lb

## 2020-02-25 DIAGNOSIS — R0602 Shortness of breath: Secondary | ICD-10-CM | POA: Diagnosis not present

## 2020-02-25 DIAGNOSIS — R0789 Other chest pain: Secondary | ICD-10-CM

## 2020-02-25 DIAGNOSIS — M79605 Pain in left leg: Secondary | ICD-10-CM

## 2020-02-25 DIAGNOSIS — M79604 Pain in right leg: Secondary | ICD-10-CM | POA: Diagnosis not present

## 2020-02-25 DIAGNOSIS — I6523 Occlusion and stenosis of bilateral carotid arteries: Secondary | ICD-10-CM | POA: Diagnosis not present

## 2020-02-25 DIAGNOSIS — Z01812 Encounter for preprocedural laboratory examination: Secondary | ICD-10-CM

## 2020-02-25 MED ORDER — METOPROLOL TARTRATE 50 MG PO TABS
50.0000 mg | ORAL_TABLET | Freq: Once | ORAL | 0 refills | Status: DC
Start: 2020-02-25 — End: 2020-04-15

## 2020-02-25 NOTE — Telephone Encounter (Signed)
Pt is calling to say she had irritation in the vagina and on the side and she wants to know if she should come in to see her or just call something in for her. Please call her to let her know what to do.  Please advise

## 2020-02-25 NOTE — Patient Instructions (Addendum)
Medication Instructions:  The current medical regimen is effective;  continue present plan and medications.  *If you need a refill on your cardiac medications before your next appointment, please call your pharmacy*  Lab Work: Please have blood work before your Coronary CT. (BMP) If you have labs (blood work) drawn today and your tests are completely normal, you will receive your results only by: [name] Kitchen MyChart Message (if you have MyChart) OR . A paper copy in the mail If you have any lab test that is abnormal or we need to change your treatment, we will call you to review the results.   Testing/Procedures: Your physician has requested that you have an echocardiogram. Echocardiography is a painless test that uses sound waves to create images of your heart. It provides your doctor with information about the size and shape of your heart and how well your heart's chambers and valves are working. This procedure takes approximately one hour. There are no restrictions for this procedure.  This testing is completed at our [employer] office.  Your physician has requested that you have a lower extremity arterial exercise duplex. During this test, exercise and ultrasound are used to evaluate arterial blood flow in the legs. Allow one hour for this exam. There are no restrictions or special instructions.  This testing is completed at our [facility] office on the 2nd floor.  Your cardiac CT will be scheduled at:   [facility] [address] [place], [place] [address] [phone]  Please arrive at the [facility] main entrance of [facility] 30 minutes prior to test start time. Proceed to the [facility] Radiology Department (first floor) to check-in and test prep.  Please follow these instructions carefully (unless otherwise directed):   On the Night Before the Test: . Be sure to Drink plenty of water. . Do not consume any caffeinated/decaffeinated beverages or chocolate 12  hours prior to your test. . Do not take any antihistamines 12 hours prior to your test.  On the Day of the Test: . Drink plenty of water. Do not drink any water within one hour of the test. . Do not eat any food 4 hours prior to the test. . You may take your regular medications prior to the test.  . Take metoprolol (Lopressor) two hours prior to test. . HOLD Furosemide/Hydrochlorothiazide morning of the test. . FEMALES- please wear underwire-free bra if available  After the Test: . Drink plenty of water. . After receiving IV contrast, you may experience a mild flushed feeling. This is normal. . On occasion, you may experience a mild rash up to 24 hours after the test. This is not dangerous. If this occurs, you can take Benadryl 25 mg and increase your fluid intake. . If you experience trouble breathing, this can be serious. If it is severe call 911 IMMEDIATELY. If it is mild, please call our office. . If you take any of these medications: Glipizide/Metformin, Avandament, Glucavance, please do not take 48 hours after completing test unless otherwise instructed.   Once we have confirmed authorization from your insurance company, we will call you to set up a date and time for your test. Based on how quickly your insurance processes prior authorizations requests, please allow up to 4 weeks to be contacted for scheduling your Cardiac CT appointment. Be advised that routine Cardiac CT appointments could be scheduled as many as 8 weeks after your provider has ordered it.  For non-scheduling related questions, please  contact the cardiac imaging nurse navigator should you have any questions/concerns: Marchia Bond, Cardiac Imaging Nurse Navigator Burley Saver, Interim Cardiac Imaging Nurse [place] and Vascular Services Direct Office Dial: [phone]   For scheduling needs, including cancellations and rescheduling, please call Vivien Rota at [phone], option 3.   You have been  referred to Dr Trula Slade.  Follow-Up: At [facility], you and your health needs are our priority.  As part of our continuing mission to provide you with exceptional heart care, we have created designated Provider Care Teams.  These Care Teams include your primary Cardiologist (physician) and Advanced Practice Providers (APPs -  Physician Assistants and Nurse Practitioners) who all work together to provide you with the care you need, when you need it.   We recommend signing up for the patient portal called "MyChart".  Sign up information is provided on this After Visit Summary.  MyChart is used to connect with patients for Virtual Visits (Telemedicine).  Patients are able to view lab/test results, encounter notes, upcoming appointments, etc.  Non-urgent messages can be sent to your provider as well.   To learn more about what you can do with MyChart, go to [email or web address].    Follow up will be determined after the above testing has been completed.   Thank you for choosing [place]!!

## 2020-02-25 NOTE — Progress Notes (Signed)
Cardiology Office Note:    Date:  02/25/2020   ID:  Emillie, Chasen 02-11-1951, MRN [id number]  PCP:  Isaac Bliss, Rayford Halsted, MD  [place] Cardiologist:  No primary care provider on file.  [facility] Electrophysiologist:  None   Referring MD: Isaac Bliss, [place]*    History of Present Illness:    MERRIEL ZINGER is a 69 y.o. female here for the evaluation of shortness of breath at the request of Dr. Isaac Bliss  Has had a CVA in January 2018, uncontrolled hypertension diabetes hyperlipidemia alcohol history carotid artery disease followed by vascular, cirrhosis due to alcohol and hepatitis C in the past with thrombocytopenia due to splenomegaly.  Continues with dyspnea worse with steps talking going up stairs.  Felt as though she may have broken ribs attributing to this.  Fatigue.  Right leg foot numb, tingling. Left foot fracture  No early CAD in family  Past Medical History:  Diagnosis Date  . Anemia   . Arthritis   . Blood transfusion without reported diagnosis   . Carotid artery occlusion   . Cirrhosis ([place])   . Diabetes mellitus without complication ([place])   . Fibroid, uterine   . GERD (gastroesophageal reflux disease)   . Hepatitis    hep C  . Hypertension   . Stroke ([place])   . Thrombocytopenia ([place]) 12/25/2014  . Urinary incontinence    Patient noticed mild and is not currently a significant problem    Past Surgical History:  Procedure Laterality Date  . ANKLE FRACTURE SURGERY Right 1999   x 2, rod  . COLONOSCOPY    . IR GENERIC HISTORICAL  06/30/2016   IR ANGIO VERTEBRAL SEL SUBCLAVIAN INNOMINATE UNI R MOD SED 06/30/2016 [name], MD [facility]  . IR GENERIC HISTORICAL  06/30/2016   IR ANGIO VERTEBRAL SEL VERTEBRAL UNI L MOD SED 06/30/2016 [name], MD [facility]  . IR GENERIC HISTORICAL  06/30/2016   IR ANGIO INTRA EXTRACRAN SEL COM CAROTID INNOMINATE BILAT MOD SED 06/30/2016 [name], MD [facility]  [facility]  . MULTIPLE EXTRACTIONS WITH ALVEOLOPLASTY  10/25/2011   Procedure: MULTIPLE EXTRACION WITH ALVEOLOPLASTY;  Surgeon: Gae Bon, DDS;  Location: [place];  Service: Oral Surgery;  Laterality: Bilateral;  Extract teeth numbers one, two, six, seven, eight, nine, ten, eleven, twelve, fifteen, sixteen, eighteen, nineteen, twenty-three, twenty-four, twenty-five, twenty-seven, thirty-two and alveoplasty.    Current Medications: Current Meds  Medication Sig  . Accu-Chek FastClix Lancets MISC 1 each by Does not apply route daily.  . Accu-Chek Softclix Lancets lancets USE AS DIRECTED TO CHECK BLOOD SUGAR TWICE A DAY  . amLODipine (NORVASC) 10 MG tablet Take 1 tablet (10 mg total) by mouth daily.  . ASPIRIN LOW DOSE 81 MG EC tablet TAKE 1 TABLET BY MOUTH EVERY DAY  . Blood Glucose Monitoring Suppl (ACCU-CHEK GUIDE) w/Device KIT Please specify directions, refills and quantity  . Cholecalciferol 25 MCG (1000 UT) capsule Take 1,000 Units by mouth daily.   . cyanocobalamin 1000 MCG tablet Take 1,000 mcg by mouth daily.   . diclofenac (VOLTAREN) 75 MG EC tablet Take 75 mg by mouth as needed.  . Evolocumab (REPATHA SURECLICK) 193 MG/ML SOAJ Inject 140 mg into the skin every 14 (fourteen) days. Must be seen for further refills. Call [phone] to schedule.  [name] Kitchen glucose blood (ACCU-CHEK GUIDE) test strip 1 each by Other route in the morning and at bedtime. Dx E11.9  . hydrALAZINE (APRESOLINE) 25 MG tablet Take  25 mg by mouth daily.  . hydrochlorothiazide (HYDRODIURIL) 25 MG tablet Take 1 tablet (25 mg total) by mouth daily.  [name] Kitchen HYDROcodone-acetaminophen (NORCO/VICODIN) 5-325 MG tablet Take 1 tablet by mouth every 6 (six) hours as needed.  [name] Kitchen lisinopril (ZESTRIL) 20 MG tablet TAKE 1 TABLET BY MOUTH EVERY DAY  . meloxicam (MOBIC) 15 MG tablet TAKE 1 TABLET BY MOUTH EVERY DAY (Patient taking differently: as needed. )  . metFORMIN (GLUCOPHAGE) 500 MG tablet Take 500 mg by mouth every morning.  . triamcinolone  cream (KENALOG) 0.1 % APPLY TO AFFECTED AREA TWICE A DAY  . [DISCONTINUED] metFORMIN (GLUCOPHAGE) 1000 MG tablet Take 0.5 tablets (500 mg total) by mouth daily with breakfast.     Allergies:   Patient has no known allergies.   Social History   Socioeconomic History  . Marital status: Legally Separated    Spouse name: Not on file  . Number of children: 3  . Years of education: Not on file  . Highest education level: Not on file  Occupational History  . Occupation: disabled  Tobacco Use  . Smoking status: Never Smoker  . Smokeless tobacco: Never Used  Vaping Use  . Vaping Use: Never used  Substance and Sexual Activity  . Alcohol use: Yes    Alcohol/week: 1.0 standard drink    Types: 1 Cans of beer per week    Comment: 1/2 bottle of wine and some beer a day  . Drug use: Not Currently    Frequency: 1.0 times per week    Types: Cocaine    Comment: last use cocaine last year 2018  . Sexual activity: Not Currently  Other Topics Concern  . Not on file  Social History Narrative  . Not on file   Social Determinants of Health   Financial Resource Strain: Low Risk   . Difficulty of Paying Living Expenses: Not hard at all  Food Insecurity:   . Worried About Charity fundraiser in the Last Year: Not on file  . Ran Out of Food in the Last Year: Not on file  Transportation Needs: No Transportation Needs  . Lack of Transportation (Medical): No  . Lack of Transportation (Non-Medical): No  Physical Activity:   . Days of Exercise per Week: Not on file  . Minutes of Exercise per Session: Not on file  Stress:   . Feeling of Stress : Not on file  Social Connections:   . Frequency of Communication with Friends and Family: Not on file  . Frequency of Social Gatherings with Friends and Family: Not on file  . Attends Religious Services: Not on file  . Active Member of Clubs or Organizations: Not on file  . Attends Archivist Meetings: Not on file  . Marital Status: Not on file      Family History: The patient's family history includes Cancer in her father; Diabetes in her daughter; Diabetes type II in her mother; Hypertension in her mother; Stroke in her mother. There is no history of Breast cancer, Colon polyps, Crohn's disease, Esophageal cancer, Rectal cancer, Stomach cancer, or Colon cancer.  ROS:   Please see the history of present illness.     All other systems reviewed and are negative.  EKGs/Labs/Other Studies Reviewed:    The following studies were reviewed today:  Carotid Doppler 01/02/2020:  Summary:  Right Carotid: Velocities in the right ICA are consistent with a 60-79%         stenosis. Velocities have significantly increased  since  last         exam.   Left Carotid: Velocities in the left ICA are consistent with a 40-59%  stenosis.     Recent Labs: No results found for requested labs within last 8760 hours.  Recent Lipid Panel    Component Value Date/Time   CHOL 146 02/20/2020 0822   TRIG 116 02/20/2020 0822   HDL 52 02/20/2020 0822   CHOLHDL 2.8 02/20/2020 0822   CHOLHDL 5 06/22/2018 0901   VLDL 16.8 06/22/2018 0901   LDLCALC 73 02/20/2020 0822    Physical Exam:    VS:  BP 132/68   Pulse 76   Ht 5' 5"  (1.651 m)   Wt 166 lb (75.3 kg)   SpO2 98%   BMI 27.62 kg/m     Wt Readings from Last 3 Encounters:  02/25/20 166 lb (75.3 kg)  02/20/20 168 lb (76.2 kg)  01/03/20 169 lb (76.7 kg)     GEN:  Well nourished, well developed in no acute distress HEENT: Normal NECK: No JVD; No carotid bruits LYMPHATICS: No lymphadenopathy CARDIAC: RRR, 2/6 SM, no rubs, gallops RESPIRATORY:  Clear to auscultation without rales, wheezing or rhonchi  ABDOMEN: Soft, non-tender, non-distended MUSCULOSKELETAL:  No edema; No deformity  SKIN: Warm and dry NEUROLOGIC:  Alert and oriented x 3 PSYCHIATRIC:  Normal affect   ASSESSMENT:    1. Bilateral carotid artery stenosis   2. Other chest pain   3. Pain in both lower  extremities   4. Pre-procedure lab exam   5. Shortness of breath    PLAN:    In order of problems listed above:  Dyspnea on exertion/extreme fatigue/PVD -Certainly could be anginal equivalent.  Diabetes hypertension hyperlipidemia prior cocaine use concerning for accelerated coronary artery disease possibility. --ECHO --Cor CT with possible FFR.  Her dyspnea certainly could be an anginal equivalent  Peripheral vascular disease/carotid artery disease -Right carotid up to 79%, left 59% -She last saw Dr. Trula Slade with [facility] in 2018.  We will refer her back.  Foot pain/possible claudication -Could be neuropathy or possible claudication.  Difficult to palpate any distal pulses.  She has had surgery on her right lower leg, rod in place after fracture.  She also states that she fractured her left foot. -I will check lower extremity arterial vascular studies.  Certainly with her carotid disease, she did have significant PVD as well.  Hyperlipidemia -She is not taking atorvastatin 80 mg.  Side effects. -She is taking Repatha.  Last LDL 59 in September 2020 from outside labs.  Diabetes with hypertension -Last hemoglobin A1c 5.3, excellent, medications reviewed as above.  No changes.  Creatinine 1.0 previously, TSH 3.3 from outside labs.  History of stroke -Continue with aggressive secondary risk factor prevention.  We will go ahead and follow-up with the results of the studies.  Get her back in with vascular, Dr. Trula Slade.  Continue with aggressive secondary risk factor prevention.    Medication Adjustments/Labs and Tests Ordered: Current medicines are reviewed at length with the patient today.  Concerns regarding medicines are outlined above.  Orders Placed This Encounter  Procedures  . CT CORONARY MORPH W/CTA COR W/SCORE W/CA W/CM &/OR WO/CM  . CT CORONARY FRACTIONAL FLOW RESERVE DATA PREP  . CT CORONARY FRACTIONAL FLOW RESERVE FLUID ANALYSIS  . Basic metabolic panel  . Ambulatory  referral to Vascular Surgery  . ECHOCARDIOGRAM COMPLETE  . VAS [place] LOWER EXTREMITY ARTERIAL DUPLEX   Meds ordered this encounter  Medications  .  metoprolol tartrate (LOPRESSOR) 50 MG tablet    Sig: Take 1 tablet (50 mg total) by mouth once for 1 dose. Take one tablet 2 hours before your Coronary CT    Dispense:  1 tablet    Refill:  0    Patient Instructions  Medication Instructions:  The current medical regimen is effective;  continue present plan and medications.  *If you need a refill on your cardiac medications before your next appointment, please call your pharmacy*  Lab Work: Please have blood work before your Coronary CT. (BMP) If you have labs (blood work) drawn today and your tests are completely normal, you will receive your results only by: [name] Kitchen MyChart Message (if you have MyChart) OR . A paper copy in the mail If you have any lab test that is abnormal or we need to change your treatment, we will call you to review the results.   Testing/Procedures: Your physician has requested that you have an echocardiogram. Echocardiography is a painless test that uses sound waves to create images of your heart. It provides your doctor with information about the size and shape of your heart and how well your heart's chambers and valves are working. This procedure takes approximately one hour. There are no restrictions for this procedure.  This testing is completed at our [employer] office.  Your physician has requested that you have a lower extremity arterial exercise duplex. During this test, exercise and ultrasound are used to evaluate arterial blood flow in the legs. Allow one hour for this exam. There are no restrictions or special instructions.  This testing is completed at our [facility] office on the 2nd floor.  Your cardiac CT will be scheduled at:   [facility] [address] [place], [place] [address] [phone]  Please arrive at the [facility] main  entrance of [facility] 30 minutes prior to test start time. Proceed to the [facility] Radiology Department (first floor) to check-in and test prep.  Please follow these instructions carefully (unless otherwise directed):   On the Night Before the Test: . Be sure to Drink plenty of water. . Do not consume any caffeinated/decaffeinated beverages or chocolate 12 hours prior to your test. . Do not take any antihistamines 12 hours prior to your test.  On the Day of the Test: . Drink plenty of water. Do not drink any water within one hour of the test. . Do not eat any food 4 hours prior to the test. . You may take your regular medications prior to the test.  . Take metoprolol (Lopressor) two hours prior to test. . HOLD Furosemide/Hydrochlorothiazide morning of the test. . FEMALES- please wear underwire-free bra if available  After the Test: . Drink plenty of water. . After receiving IV contrast, you may experience a mild flushed feeling. This is normal. . On occasion, you may experience a mild rash up to 24 hours after the test. This is not dangerous. If this occurs, you can take Benadryl 25 mg and increase your fluid intake. . If you experience trouble breathing, this can be serious. If it is severe call 911 IMMEDIATELY. If it is mild, please call our office. . If you take any of these medications: Glipizide/Metformin, Avandament, Glucavance, please do not take 48 hours after completing test unless otherwise instructed.   Once we have confirmed authorization from your insurance company, we will call you to set up a date and time for your test. Based on  how quickly your insurance processes prior authorizations requests, please allow up to 4 weeks to be contacted for scheduling your Cardiac CT appointment. Be advised that routine Cardiac CT appointments could be scheduled as many as 8 weeks after your provider has ordered it.  For non-scheduling related questions, please contact the  cardiac imaging nurse navigator should you have any questions/concerns: Marchia Bond, Cardiac Imaging Nurse Navigator Burley Saver, Interim Cardiac Imaging Nurse [place] and Vascular Services Direct Office Dial: [phone]   For scheduling needs, including cancellations and rescheduling, please call Vivien Rota at [phone], option [phone].   You have been referred to Dr Trula Slade.  Follow-Up: At [facility], you and your health needs are our priority.  As part of our continuing mission to provide you with exceptional heart care, we have created designated Provider Care Teams.  These Care Teams include your primary Cardiologist (physician) and Advanced Practice Providers (APPs -  Physician Assistants and Nurse Practitioners) who all work together to provide you with the care you need, when you need it.   We recommend signing up for the patient portal called "MyChart".  Sign up information is provided on this After Visit Summary.  MyChart is used to connect with patients for Virtual Visits (Telemedicine).  Patients are able to view lab/test results, encounter notes, upcoming appointments, etc.  Non-urgent messages can be sent to your provider as well.   To learn more about what you can do with MyChart, go to [email or web address].    Follow up will be determined after the above testing has been completed.   Thank you for choosing [facility]!!        Signed, Candee Furbish, MD  02/25/2020 9:54 AM    [facility]

## 2020-02-26 NOTE — Telephone Encounter (Signed)
Needs OV.  

## 2020-02-26 NOTE — Telephone Encounter (Signed)
Spoke with patient and an appointment scheduled 

## 2020-02-29 ENCOUNTER — Other Ambulatory Visit (INDEPENDENT_AMBULATORY_CARE_PROVIDER_SITE_OTHER): Payer: Medicare Other

## 2020-02-29 ENCOUNTER — Other Ambulatory Visit: Payer: Self-pay | Admitting: Internal Medicine

## 2020-02-29 ENCOUNTER — Other Ambulatory Visit: Payer: Self-pay

## 2020-02-29 ENCOUNTER — Ambulatory Visit: Payer: Self-pay | Admitting: Internal Medicine

## 2020-02-29 DIAGNOSIS — R300 Dysuria: Secondary | ICD-10-CM

## 2020-02-29 LAB — POCT URINALYSIS DIPSTICK
Bilirubin, UA: NEGATIVE
Blood, UA: NEGATIVE
Glucose, UA: NEGATIVE
Ketones, UA: NEGATIVE
Protein, UA: NEGATIVE
Spec Grav, UA: 1.015 (ref 1.010–1.025)
Urobilinogen, UA: 0.2 E.U./dL
pH, UA: 5 (ref 5.0–8.0)

## 2020-03-01 ENCOUNTER — Telehealth (INDEPENDENT_AMBULATORY_CARE_PROVIDER_SITE_OTHER): Payer: Medicare Other | Admitting: Family Medicine

## 2020-03-01 ENCOUNTER — Encounter: Payer: Self-pay | Admitting: Family Medicine

## 2020-03-01 DIAGNOSIS — R300 Dysuria: Secondary | ICD-10-CM

## 2020-03-01 MED ORDER — CEPHALEXIN 500 MG PO CAPS
500.0000 mg | ORAL_CAPSULE | Freq: Four times a day (QID) | ORAL | 0 refills | Status: DC
Start: 2020-03-01 — End: 2020-03-18

## 2020-03-01 NOTE — Assessment & Plan Note (Addendum)
Symptoms are concerning for UTI with external vaginal irritation from wiping excessively. Given the lack of information regarding leukocytes and nitrites on her point-of-care urine dipstick we will treat empirically for a UTI. Treating with Keflex as outlined below. If not improving with antibiotic treatment she will follow-up with her primary care office.

## 2020-03-01 NOTE — Progress Notes (Signed)
Virtual Visit via telephone Note  This visit type was conducted due to national recommendations for restrictions regarding the COVID-19 pandemic (e.g. social distancing).  This format is felt to be most appropriate for this patient at this time.  All issues noted in this document were discussed and addressed.  No physical exam was performed (except for noted visual exam findings with Video Visits).   I connected with Deborah Wise today at 10:00 AM EDT by telephone and verified that I am speaking with the correct person using two identifiers. Location patient: [place] Location provider: work Persons participating in the virtual visit: patient, provider  I discussed the limitations, risks, security and privacy concerns of performing an evaluation and management service by telephone and the availability of in person appointments. I also discussed with the patient that there may be a patient responsible charge related to this service. The patient expressed understanding and agreed to proceed.  Interactive audio and video telecommunications were attempted between this provider and patient, however failed, due to patient having technical difficulties OR patient did not have access to video capability.  We continued and completed visit with audio only.   Reason for visit: same day visit  HPI: Dysuria: Patient notes onset over the last 2 weeks. She has dysuria that occurs throughout the urinary stream and pain externally related to rash. No blood in her urine. She has had increasing urinary frequency. She has chronic urgency. She notes a little bit of a rash externally from wiping so much. Notes that is improving. No fevers. No itching. No changes to soaps or detergents. No medications for this. She had a urinalysis completed yesterday though the nitrites and leukocytes were not documented.   ROS: See pertinent positives and negatives per HPI.  Past Medical History:  Diagnosis Date  . Anemia   .  Arthritis   . Blood transfusion without reported diagnosis   . Carotid artery occlusion   . Cirrhosis ([place])   . Diabetes mellitus without complication ([place])   . Fibroid, uterine   . GERD (gastroesophageal reflux disease)   . Hepatitis    hep C  . Hypertension   . Stroke ([place])   . Thrombocytopenia ([place]) 12/25/2014  . Urinary incontinence    Patient noticed mild and is not currently a significant problem    Past Surgical History:  Procedure Laterality Date  . ANKLE FRACTURE SURGERY Right 1999   x 2, rod  . COLONOSCOPY    . IR GENERIC HISTORICAL  06/30/2016   IR ANGIO VERTEBRAL SEL SUBCLAVIAN INNOMINATE UNI R MOD SED 06/30/2016 [name], MD [facility]  . IR GENERIC HISTORICAL  06/30/2016   IR ANGIO VERTEBRAL SEL VERTEBRAL UNI L MOD SED 06/30/2016 [name], MD [facility]  . IR GENERIC HISTORICAL  06/30/2016   IR ANGIO INTRA EXTRACRAN SEL COM CAROTID INNOMINATE BILAT MOD SED 06/30/2016 [name], MD [facility]  . MULTIPLE EXTRACTIONS WITH ALVEOLOPLASTY  10/25/2011   Procedure: MULTIPLE EXTRACION WITH ALVEOLOPLASTY;  Surgeon: Gae Bon, DDS;  Location: [facility];  Service: Oral Surgery;  Laterality: Bilateral;  Extract teeth numbers one, two, six, seven, eight, nine, ten, eleven, twelve, fifteen, sixteen, eighteen, nineteen, twenty-three, twenty-four, twenty-five, twenty-seven, thirty-two and alveoplasty.    Family History  Problem Relation Age of Onset  . Diabetes type II Mother   . Hypertension Mother   . Stroke Mother   . Cancer Father        patient does not know what type of  cancer  . Diabetes Daughter   . Breast cancer Neg Hx   . Colon polyps Neg Hx   . Crohn's disease Neg Hx   . Esophageal cancer Neg Hx   . Rectal cancer Neg Hx   . Stomach cancer Neg Hx   . Colon cancer Neg Hx     SOCIAL HX: Non-smoker   Current Outpatient Medications:  .  Accu-Chek FastClix Lancets MISC, 1 each by Does not apply route daily., Disp: 100 each, Rfl: 3 .   Accu-Chek Softclix Lancets lancets, USE AS DIRECTED TO CHECK BLOOD SUGAR TWICE A DAY, Disp: 100 each, Rfl: 0 .  amLODipine (NORVASC) 10 MG tablet, Take 1 tablet (10 mg total) by mouth daily., Disp: 90 tablet, Rfl: 1 .  ASPIRIN LOW DOSE 81 MG EC tablet, TAKE 1 TABLET BY MOUTH EVERY DAY, Disp: 90 tablet, Rfl: 1 .  atorvastatin (LIPITOR) 80 MG tablet, Take 1 tablet (80 mg total) by mouth daily., Disp: 90 tablet, Rfl: 1 .  Blood Glucose Monitoring Suppl (ACCU-CHEK GUIDE) w/Device KIT, Please specify directions, refills and quantity, Disp: 1 kit, Rfl: 0 .  Cholecalciferol 25 MCG (1000 UT) capsule, Take 1,000 Units by mouth daily. , Disp: , Rfl:  .  cyanocobalamin 1000 MCG tablet, Take 1,000 mcg by mouth daily. , Disp: , Rfl:  .  cyclobenzaprine (FLEXERIL) 10 MG tablet, cyclobenzaprine 10 mg tablet  TAKE 1 TABLET BY MOUTH TWICE A DAY AS NEEDED FOR MUSCLE SPASMS, Disp: , Rfl:  .  diclofenac (VOLTAREN) 75 MG EC tablet, Take 75 mg by mouth as needed., Disp: , Rfl:  .  Evolocumab (REPATHA SURECLICK) 102 MG/ML SOAJ, Inject 140 mg into the skin every 14 (fourteen) days. Must be seen for further refills. Call [phone] to schedule., Disp: 2 mL, Rfl: 0 .  glucose blood (ACCU-CHEK GUIDE) test strip, 1 each by Other route in the morning and at bedtime. Dx E11.9, Disp: 100 strip, Rfl: 0 .  hydrALAZINE (APRESOLINE) 25 MG tablet, Take 25 mg by mouth daily., Disp: , Rfl:  .  hydrochlorothiazide (HYDRODIURIL) 25 MG tablet, Take 1 tablet (25 mg total) by mouth daily., Disp: 90 tablet, Rfl: 1 .  HYDROcodone-acetaminophen (NORCO/VICODIN) 5-325 MG tablet, Take 1 tablet by mouth every 6 (six) hours as needed., Disp: , Rfl:  .  lisinopril (ZESTRIL) 20 MG tablet, TAKE 1 TABLET BY MOUTH EVERY DAY, Disp: 90 tablet, Rfl: 3 .  meloxicam (MOBIC) 15 MG tablet, TAKE 1 TABLET BY MOUTH EVERY DAY (Patient taking differently: as needed. ), Disp: 30 tablet, Rfl: 0 .  metFORMIN (GLUCOPHAGE) 500 MG tablet, Take 500 mg by mouth every  morning., Disp: , Rfl:  .  triamcinolone cream (KENALOG) 0.1 %, APPLY TO AFFECTED AREA TWICE A DAY, Disp: 30 g, Rfl: 2 .  cephALEXin (KEFLEX) 500 MG capsule, Take 1 capsule (500 mg total) by mouth 4 (four) times daily., Disp: 20 capsule, Rfl: 0 .  metoprolol tartrate (LOPRESSOR) 50 MG tablet, Take 1 tablet (50 mg total) by mouth once for 1 dose. Take one tablet 2 hours before your Coronary CT, Disp: 1 tablet, Rfl: 0  EXAM: This is a telephone visit and thus no physical exam was completed.  ASSESSMENT AND PLAN:  Discussed the following assessment and plan:  Dysuria Symptoms are concerning for UTI with external vaginal irritation from wiping excessively. Given the lack of information regarding leukocytes and nitrites on her point-of-care urine dipstick we will treat empirically for a UTI. Treating with Keflex as outlined  below. If not improving with antibiotic treatment she will follow-up with her primary care office.   No orders of the defined types were placed in this encounter.   Meds ordered this encounter  Medications  . cephALEXin (KEFLEX) 500 MG capsule    Sig: Take 1 capsule (500 mg total) by mouth 4 (four) times daily.    Dispense:  20 capsule    Refill:  0     I discussed the assessment and treatment plan with the patient. The patient was provided an opportunity to ask questions and all were answered. The patient agreed with the plan and demonstrated an understanding of the instructions.   The patient was advised to call back or seek an in-person evaluation if the symptoms worsen or if the condition fails to improve as anticipated.  I provided 6 minutes of non-face-to-face time during this encounter.   Tommi Rumps, MD

## 2020-03-03 ENCOUNTER — Telehealth: Payer: Self-pay

## 2020-03-03 NOTE — Telephone Encounter (Signed)
-----   Message from Frann Rider, NP sent at 03/03/2020 11:53 AM EDT ----- Please advise patient that recent lab work showed stable appearance of cholesterol with LDL 73 -continue Repatha -no indication to restart atorvastatin at this time.  No indication of diabetes with normal A1c level.

## 2020-03-03 NOTE — Telephone Encounter (Signed)
Pt was contacted and notified of the message below. Pt was happy and has no additional questions

## 2020-03-04 ENCOUNTER — Telehealth: Payer: Self-pay | Admitting: Internal Medicine

## 2020-03-04 NOTE — Telephone Encounter (Signed)
Patient dropped off a handicap placard form  When completed mail to:   [address]  [place] [address]  Disposition: Dr's Folder

## 2020-03-05 ENCOUNTER — Other Ambulatory Visit: Payer: Self-pay

## 2020-03-05 ENCOUNTER — Other Ambulatory Visit: Payer: Medicare Other | Admitting: *Deleted

## 2020-03-05 DIAGNOSIS — R0602 Shortness of breath: Secondary | ICD-10-CM | POA: Diagnosis not present

## 2020-03-05 DIAGNOSIS — Z01812 Encounter for preprocedural laboratory examination: Secondary | ICD-10-CM | POA: Diagnosis not present

## 2020-03-05 LAB — BASIC METABOLIC PANEL
BUN/Creatinine Ratio: 12 (ref 12–28)
BUN: 11 mg/dL (ref 8–27)
CO2: 22 mmol/L (ref 20–29)
Calcium: 10 mg/dL (ref 8.7–10.3)
Chloride: 104 mmol/L (ref 96–106)
Creatinine, Ser: 0.9 mg/dL (ref 0.57–1.00)
GFR calc Af Amer: 76 mL/min/{1.73_m2} (ref >59)
GFR calc non Af Amer: 66 mL/min/{1.73_m2} (ref >59)
Glucose: 106 mg/dL — ABNORMAL HIGH (ref 65–99)
Potassium: 4.1 mmol/L (ref 3.5–5.2)
Sodium: 142 mmol/L (ref 134–144)

## 2020-03-07 NOTE — Telephone Encounter (Signed)
Form is completed and mailed to the patient.  Patient has been contacted to let her know that the form was just completed and will probably be mailed out on Monday.

## 2020-03-10 ENCOUNTER — Telehealth (HOSPITAL_COMMUNITY): Payer: Self-pay | Admitting: Emergency Medicine

## 2020-03-10 NOTE — Telephone Encounter (Signed)
Reaching out to patient to offer assistance regarding upcoming cardiac imaging study; pt verbalizes understanding of appt date/time, parking situation and where to check in, pre-test NPO status and medications ordered, and verified current allergies; name and call back number provided for further questions should they arise Marchia Bond RN Navigator Card[facility] [phone] office [phone] cell   Pt advised to hold HCTZ day of test. Taking metoprolol 2 hr prior. Pt uses medical transportation.   Clarise Cruz

## 2020-03-11 ENCOUNTER — Encounter (HOSPITAL_COMMUNITY): Payer: Self-pay

## 2020-03-11 ENCOUNTER — Telehealth: Payer: Self-pay | Admitting: Internal Medicine

## 2020-03-11 ENCOUNTER — Ambulatory Visit (HOSPITAL_COMMUNITY)
Admission: RE | Admit: 2020-03-11 | Discharge: 2020-03-11 | Disposition: A | Discharge status: Home / Self Care | Payer: Medicare Other | Source: Ambulatory Visit | Attending: Cardiology | Admitting: Cardiology

## 2020-03-11 DIAGNOSIS — R0602 Shortness of breath: Secondary | ICD-10-CM | POA: Diagnosis not present

## 2020-03-11 DIAGNOSIS — I700 Atherosclerosis of aorta: Secondary | ICD-10-CM | POA: Diagnosis not present

## 2020-03-11 DIAGNOSIS — I2510 Atherosclerotic heart disease of native coronary artery without angina pectoris: Secondary | ICD-10-CM | POA: Insufficient documentation

## 2020-03-11 MED ORDER — NITROGLYCERIN 0.4 MG SL SUBL
SUBLINGUAL_TABLET | SUBLINGUAL | Status: AC
  Administered 2020-03-11: 0.8 mg via SUBLINGUAL
  Filled 2020-03-11: qty 2

## 2020-03-11 MED ORDER — NITROGLYCERIN 0.4 MG SL SUBL: 0.8000 mg | SUBLINGUAL_TABLET | Freq: Once | SUBLINGUAL | Status: AC

## 2020-03-11 MED ORDER — IOHEXOL 350 MG/ML SOLN
80.0000 mL | Freq: Once | INTRAVENOUS | Status: AC | PRN
  Administered 2020-03-11: 80 mL via INTRAVENOUS

## 2020-03-11 NOTE — Discharge Instructions (Signed)
Cardiac CT Angiogram A cardiac CT angiogram is a procedure to look at the heart and the area around the heart. It may be done to help find the cause of chest pains or other symptoms of heart disease. During this procedure, a substance called contrast dye is injected into the blood vessels in the area to be checked. A large X-ray machine, called a CT scanner, then takes detailed pictures of the heart and the surrounding area. The procedure is also sometimes called a coronary CT angiogram, coronary artery scanning, or CTA. A cardiac CT angiogram allows the health care provider to see how well blood is flowing to and from the heart. The health care provider will be able to see if there are any problems, such as:  Blockage or narrowing of the coronary arteries in the heart.  Fluid around the heart.  Signs of weakness or disease in the muscles, valves, and tissues of the heart. Tell a health care provider about:  Any allergies you have. This is especially important if you have had a previous allergic reaction to contrast dye.  All medicines you are taking, including vitamins, herbs, eye drops, creams, and over-the-counter medicines.  Any blood disorders you have.  Any surgeries you have had.  Any medical conditions you have.  Whether you are pregnant or may be pregnant.  Any anxiety disorders, chronic pain, or other conditions you have that may increase your stress or prevent you from lying still. What are the risks? Generally, this is a safe procedure. However, problems may occur, including:  Bleeding.  Infection.  Allergic reactions to medicines or dyes.  Damage to other structures or organs.  Kidney damage from the contrast dye that is used.  Increased risk of cancer from radiation exposure. This risk is low. Talk with your health care provider about: ? The risks and benefits of testing. ? How you can receive the lowest dose of radiation. What happens before the  procedure?  Wear comfortable clothing and remove any jewelry, glasses, dentures, and hearing aids.  Follow instructions from your health care provider about eating and drinking. This may include: ? For 12 hours before the procedure -- avoid caffeine. This includes tea, coffee, soda, energy drinks, and diet pills. Drink plenty of water or other fluids that do not have caffeine in them. Being well hydrated can prevent complications. ? For 4-6 hours before the procedure -- stop eating and drinking. The contrast dye can cause nausea, but this is less likely if your stomach is empty.  Ask your health care provider about changing or stopping your regular medicines. This is especially important if you are taking diabetes medicines, blood thinners, or medicines to treat problems with erections (erectile dysfunction). What happens during the procedure?   Hair on your chest may need to be removed so that small sticky patches called electrodes can be placed on your chest. These will transmit information that helps to monitor your heart during the procedure.  An IV will be inserted into one of your veins.  You might be given a medicine to control your heart rate during the procedure. This will help to ensure that good images are obtained.  You will be asked to lie on an exam table. This table will slide in and out of the CT machine during the procedure.  Contrast dye will be injected into the IV. You might feel warm, or you may get a metallic taste in your mouth.  You will be given a medicine called   nitroglycerin. This will relax or dilate the arteries in your heart.  The table that you are lying on will move into the CT machine tunnel for the scan.  The person running the machine will give you instructions while the scans are being done. You may be asked to: ? Keep your arms above your head. ? Hold your breath. ? Stay very still, even if the table is moving.  When the scanning is complete, you  will be moved out of the machine.  The IV will be removed. The procedure may vary among health care providers and hospitals. What can I expect after the procedure? After your procedure, it is common to have:  A metallic taste in your mouth from the contrast dye.  A feeling of warmth.  A headache from the nitroglycerin. Follow these instructions at home:  Take over-the-counter and prescription medicines only as told by your health care provider.  If you are told, drink enough fluid to keep your urine pale yellow. This will help to flush the contrast dye out of your body.  Most people can return to their normal activities right after the procedure. Ask your health care provider what activities are safe for you.  It is up to you to get the results of your procedure. Ask your health care provider, or the department that is doing the procedure, when your results will be ready.  Keep all follow-up visits as told by your health care provider. This is important. Contact a health care provider if:  You have any symptoms of allergy to the contrast dye. These include: ? Shortness of breath. ? Rash or hives. ? A racing heartbeat. Summary  A cardiac CT angiogram is a procedure to look at the heart and the area around the heart. It may be done to help find the cause of chest pains or other symptoms of heart disease.  During this procedure, a large X-ray machine, called a CT scanner, takes detailed pictures of the heart and the surrounding area after a contrast dye has been injected into blood vessels in the area.  Ask your health care provider about changing or stopping your regular medicines before the procedure. This is especially important if you are taking diabetes medicines, blood thinners, or medicines to treat erectile dysfunction.  If you are told, drink enough fluid to keep your urine pale yellow. This will help to flush the contrast dye out of your body. This information is not  intended to replace advice given to you by your health care provider. Make sure you discuss any questions you have with your health care provider. Document Revised: 01/17/2019 Document Reviewed: 01/17/2019 Elsevier Patient Education  2020 [employer] Testing With IV Contrast Material IV contrast material is a fluid that is used with some imaging tests. It is injected into your body through a vein. Contrast material is used when your health care providers need a detailed look at organs, tissues, or blood vessels that may not show up with the standard test. The material may be used when an X-ray, an MRI, a CT scan, or an ultrasound is done. IV contrast material may be used for imaging tests that check:  Muscles, skin, and fat.  Breasts.  Brain.  Digestive tract.  Heart.  Organs such as the liver, kidneys, lungs, bladder, and many others.  Arteries and veins. Tell a health care provider about:  Any allergies you have, especially an allergy to contrast material.  All medicines you are taking, including   metformin, beta blockers, NSAIDs (such as ibuprofen), interleukin-2, vitamins, herbs, eye drops, creams, and over-the-counter medicines.  Any problems you or family members have had with the use of contrast material.  Any blood disorders you have, such as sickle cell anemia.  Any surgeries you have had.  Any medical conditions you have or have had, especially alcohol abuse, dehydration, asthma, or kidney, liver, or heart problems.  Whether you are pregnant or may be pregnant.  Whether you are breastfeeding. Most contrast materials are safe for use in breastfeeding women. What are the risks? Generally, this is a safe procedure. However, problems may occur, including:  Headache.  Itching, skin rash, and hives.  Nausea and vomiting.  Allergic reactions.  Wheezing or difficulty breathing.  Abnormal heart rate.  Changes in blood pressure.  Throat swelling.  Kidney  damage. What happens before the procedure? Medicines Ask your health care provider about:  Changing or stopping your regular medicines. This is especially important if you are taking diabetes medicines or blood thinners.  Taking medicines such as aspirin and ibuprofen. These medicines can thin your blood. Do not take these medicines unless your health care provider tells you to take them.  Taking over-the-counter medicines, vitamins, herbs, and supplements. If you are at risk of having a reaction to the IV contrast material, you may be asked to take medicine before the procedure to prevent a reaction. General instructions  Follow instructions from your health care provider about eating or drinking restrictions.  You may have an exam or lab tests to make sure that you can safely get IV contrast material.  Ask if you will be given a medicine to help you relax (sedative) during the procedure. If so, plan to have someone take you home from the hospital or clinic. What happens during the procedure?  You may be given a sedative to help you relax.  An IV will be inserted into one of your veins.  Contrast material will be injected into your IV.  You may feel warmth or flushing as the contrast material enters your bloodstream.  You may have a metallic taste in your mouth for a few minutes.  The needle may cause some discomfort and bruising.  After the contrast material is in your body, the imaging test will be done. The procedure may vary among health care providers and hospitals. What can I expect after the procedure?  The IV will be removed.  You may be taken to a recovery area if sedation medicines were used. Your blood pressure, heart rate, breathing rate, and blood oxygen level will be monitored until you leave the hospital or clinic. Follow these instructions at home:   Take over-the-counter and prescription medicines only as told by your health care provider. ? Your health  care provider may tell you to not take certain medicines for a couple of days after the procedure. This is especially important if you are taking diabetes medicines.  If you are told, drink enough fluid to keep your urine pale yellow. This will help to remove the contrast material out of your body.  Do not drive for 24 hours if you were given a sedative during your procedure.  It is up to you to get the results of your procedure. Ask your health care provider, or the department that is doing the procedure, when your results will be ready.  Keep all follow-up visits as told by your health care provider. This is important. Contact a health care provider if:    You have redness, swelling, or pain near your IV site. Get help right away if:  You have an abnormal heart rhythm.  You have trouble breathing.  You have: ? Chest pain. ? Pain in your back, neck, arm, jaw, or stomach. ? Nausea or sweating. ? Hives or a rash.  You start shaking and cannot stop. These symptoms may represent a serious problem that is an emergency. Do not wait to see if the symptoms will go away. Get medical help right away. Call your local emergency services (911 in the [place] Do not drive yourself to the hospital. Summary  IV contrast material may be used for imaging tests to help your health care providers see your organs and tissues more clearly.  Tell your health care provider if you are pregnant or may be pregnant.  During the procedure, you may feel warmth or flushing as the contrast material enters your bloodstream.  After the procedure, drink enough fluid to keep your urine pale yellow. This information is not intended to replace advice given to you by your health care provider. Make sure you discuss any questions you have with your health care provider. Document Revised: 08/10/2018 Document Reviewed: 08/10/2018 Elsevier Patient Education  2020 [employer] Inc.  

## 2020-03-11 NOTE — Telephone Encounter (Signed)
Pt called wanting to know if she had been referred to another dr for anything.  She got a call with an authorization number stating to call her [email or web address]

## 2020-03-12 ENCOUNTER — Encounter: Payer: Self-pay | Admitting: Internal Medicine

## 2020-03-12 ENCOUNTER — Ambulatory Visit (HOSPITAL_COMMUNITY): Payer: Medicare Other | Attending: Cardiovascular Disease

## 2020-03-12 ENCOUNTER — Telehealth: Payer: Self-pay

## 2020-03-12 ENCOUNTER — Other Ambulatory Visit: Payer: Self-pay

## 2020-03-12 DIAGNOSIS — Z01810 Encounter for preprocedural cardiovascular examination: Secondary | ICD-10-CM | POA: Diagnosis not present

## 2020-03-12 DIAGNOSIS — Z01812 Encounter for preprocedural laboratory examination: Secondary | ICD-10-CM

## 2020-03-12 DIAGNOSIS — R0602 Shortness of breath: Secondary | ICD-10-CM | POA: Diagnosis not present

## 2020-03-12 DIAGNOSIS — I5032 Chronic diastolic (congestive) heart failure: Secondary | ICD-10-CM | POA: Insufficient documentation

## 2020-03-12 LAB — ECHOCARDIOGRAM COMPLETE
Area-P 1/2: 2.95 cm2
S' Lateral: 2.6 cm

## 2020-03-12 MED ORDER — PERFLUTREN LIPID MICROSPHERE
1.0000 mL | INTRAVENOUS | Status: AC | PRN
  Administered 2020-03-12: 2 mL via INTRAVENOUS

## 2020-03-12 NOTE — Telephone Encounter (Signed)
Patient is aware of the referral Dr Jerilee Hoh has placed

## 2020-03-12 NOTE — Progress Notes (Addendum)
Chronic Care Management Pharmacy Assistant   Name: Deborah Wise  MRN: [id number] DOB: September 16, 1950  Reason for Encounter: Medication Review   PCP : Isaac Bliss, Rayford Halsted, MD  Allergies:  No Known Allergies  Medications: Outpatient Encounter Medications as of 03/12/2020  Medication Sig   Accu-Chek FastClix Lancets MISC 1 each by Does not apply route daily.   Accu-Chek Softclix Lancets lancets USE AS DIRECTED TO CHECK BLOOD SUGAR TWICE A DAY   amLODipine (NORVASC) 10 MG tablet Take 1 tablet (10 mg total) by mouth daily.   ASPIRIN LOW DOSE 81 MG EC tablet TAKE 1 TABLET BY MOUTH EVERY DAY   atorvastatin (LIPITOR) 80 MG tablet Take 1 tablet (80 mg total) by mouth daily.   Blood Glucose Monitoring Suppl (ACCU-CHEK GUIDE) w/Device KIT Please specify directions, refills and quantity   cephALEXin (KEFLEX) 500 MG capsule Take 1 capsule (500 mg total) by mouth 4 (four) times daily.   Cholecalciferol 25 MCG (1000 UT) capsule Take 1,000 Units by mouth daily.    cyanocobalamin 1000 MCG tablet Take 1,000 mcg by mouth daily.    cyclobenzaprine (FLEXERIL) 10 MG tablet cyclobenzaprine 10 mg tablet  TAKE 1 TABLET BY MOUTH TWICE A DAY AS NEEDED FOR MUSCLE SPASMS   diclofenac (VOLTAREN) 75 MG EC tablet Take 75 mg by mouth as needed.   Evolocumab (REPATHA SURECLICK) 940 MG/ML SOAJ Inject 140 mg into the skin every 14 (fourteen) days. Must be seen for further refills. Call [phone] to schedule.   glucose blood (ACCU-CHEK GUIDE) test strip 1 each by Other route in the morning and at bedtime. Dx E11.9   hydrALAZINE (APRESOLINE) 25 MG tablet Take 25 mg by mouth daily.   hydrochlorothiazide (HYDRODIURIL) 25 MG tablet Take 1 tablet (25 mg total) by mouth daily.   HYDROcodone-acetaminophen (NORCO/VICODIN) 5-325 MG tablet Take 1 tablet by mouth every 6 (six) hours as needed.   lisinopril (ZESTRIL) 20 MG tablet TAKE 1 TABLET BY MOUTH EVERY DAY   meloxicam (MOBIC) 15 MG tablet TAKE  1 TABLET BY MOUTH EVERY DAY (Patient taking differently: as needed. )   metFORMIN (GLUCOPHAGE) 500 MG tablet Take 500 mg by mouth every morning.   metoprolol tartrate (LOPRESSOR) 50 MG tablet Take 1 tablet (50 mg total) by mouth once for 1 dose. Take one tablet 2 hours before your Coronary CT   triamcinolone cream (KENALOG) 0.1 % APPLY TO AFFECTED AREA TWICE A DAY   No facility-administered encounter medications on file as of 03/12/2020.    Current Diagnosis: Patient Active Problem List   Diagnosis Date Noted   Dysuria 03/01/2020   Chronic arthropathy 11/07/2019   Hallux limitus of right foot 11/07/2019   Diabetic neuropathy ([place]) 04/04/2019   Cirrhosis of liver ([place]) 06/22/2018   Chronic hepatitis ([place]) 06/22/2018   Hyperlipidemia associated with type 2 diabetes mellitus ([place]) 06/22/2018   Carotid artery disease ([place]) 06/22/2018   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[place]) 07/02/2016   Aneurysm of middle cerebral artery    Cerebrovascular accident (CVA) due to thrombosis of left vertebral artery ([place]) 06/28/2016   Cocaine abuse ([place])    Stroke (cerebrum) ([place]) 06/27/2016   Microcytic anemia 12/27/2014   Thrombocytopenia ([place]) 12/25/2014   Hypoglycemia 07/21/2013   Diabetes mellitus ([place]) 07/21/2013   Malignant hypertension 07/21/2013   Liver disease 07/21/2013   Alcohol abuse 07/21/2013      Follow-Up:  Pharmacist Review   Reviewed chart for medication changes ahead of medication coordination call.  02/20/20: Patient presented  to Dr. Frann Rider (Neurology). No need to restart Atorvastatin 02/25/20: Patient presented to Dr. Marlou Porch (Cardiology). Meloxicam increased to 15 mg daily. Metoprolol x1 prior to CT.   03/01/20: Video visit for Dysuria with Dr. Josephina Gip. Patient started on cephalexin 500 mg QID    BP Readings from Last 3 Encounters:  03/11/20 (!) 189/74  02/25/20 132/68  02/20/20 140/88    Lab Results  Component Value Date   HGBA1C 5.5  02/20/2020     Patient obtains medications through Adherence Packaging  30 Days   Last adherence delivery included: (medication name and frequency)  ACCU-Check Softclix Lancets             Amlodipine 10 mg Tablet Daily- Breakfast             Aspirin 81 mg Tablet Daily- Breakfast             Atorvastatin 80 mg Tablet Daily -Breakfast             Vitamin D3 MCG 1000-unit capsule Daily- Breakfast             Vitamin B-12 Daily 1000 MCG Tablet - Breakfast             Repatha Sureclick 979 MG/ML Injection- Every 14 Days             Hydralazine 25 mg Tablet Daily -Breakfast             Hydrochlorothiazide 25 mg Tablet Daily- Breakfast             Lisinopril 20 mg Tablet Daily-Breakfast             Meloxicam 15 mg Daily Tablet- Breakfast             Metformin 500 mg Daily- Breakfast             Triamcinolone cream -PRN  Patient declined (meds) last month due to PRN use/additional supply on hand.  None ID  Patient is due for next adherence delivery on: 03/19/2020. Called patient and reviewed medications and coordinated delivery.  This delivery to include:  ACCU-Check Softclix Lancets  ACCU- Check Guide test strip             Amlodipine 10 mg Tablet Daily- Breakfast             Aspirin 81 mg Tablet Daily- Breakfast             Vitamin D3 MCG 1000-unit capsule Daily- Breakfast             Vitamin B-12 Daily 1000 MCG Tablet - Breakfast             Repatha Sureclick 892 MG/ML Injection- Every 14 Days             Hydrochlorothiazide 25 mg Tablet Daily- Breakfast             Lisinopril 20 mg Tablet Daily-Breakfast             Meloxicam 15 mg Daily Tablet- PRN -Vials             Metformin 500 mg Daily- Breakfast             Triamcinolone cream -PRN  Cephalexin 500 MG Capsule - Vials Patient declined the following medications (meds) due to (reason)    Patient needs refills for ACCU-Check Softclix Lancets, ACCU- Check Guide test strip, Meloxicam 15 mg, Triamcinolone cream, Cephalexin 500 MG  Capsule   Confirmed delivery date of 03/19/2020,  advised patient that pharmacy will contact them the morning of delivery.  [place] Pharmacist Assistant [phone]

## 2020-03-13 ENCOUNTER — Telehealth: Payer: Self-pay | Admitting: *Deleted

## 2020-03-13 DIAGNOSIS — L2082 Flexural eczema: Secondary | ICD-10-CM

## 2020-03-13 MED ORDER — ACCU-CHEK FASTCLIX LANCETS MISC: 1.0000 | Freq: Every day | 3 refills | Status: DC

## 2020-03-13 MED ORDER — TRIAMCINOLONE ACETONIDE 0.1 % EX CREA: TOPICAL_CREAM | CUTANEOUS | 2 refills | Status: DC

## 2020-03-13 MED ORDER — ACCU-CHEK GUIDE VI STRP: 1.0000 | ORAL_STRIP | Freq: Two times a day (BID) | 0 refills | Status: DC

## 2020-03-13 NOTE — Telephone Encounter (Signed)
-----   Message from Germaine Pomfret, [facility] sent at 03/13/2020  4:18 PM EDT ----- Regarding: Medication Refill Apolonio Schneiders,  Can you please send in a refill of patient's accu-Check test strips and Lancets, as well as triamcinolone 0.1% cream into [facility] so we may fill these for her?   Thanks, Doristine Section Clinical Pharmacist [facility] [place]

## 2020-03-13 NOTE — Telephone Encounter (Signed)
Rx done. 

## 2020-03-14 ENCOUNTER — Telehealth: Payer: Self-pay | Admitting: Podiatry

## 2020-03-14 ENCOUNTER — Telehealth: Payer: Self-pay | Admitting: *Deleted

## 2020-03-14 DIAGNOSIS — S93402A Sprain of unspecified ligament of left ankle, initial encounter: Secondary | ICD-10-CM | POA: Diagnosis not present

## 2020-03-14 DIAGNOSIS — M7672 Peroneal tendinitis, left leg: Secondary | ICD-10-CM | POA: Diagnosis not present

## 2020-03-14 MED ORDER — MELOXICAM 15 MG PO TABS: 15.0000 mg | ORAL_TABLET | Freq: Every day | ORAL | 0 refills | Status: DC

## 2020-03-14 NOTE — Telephone Encounter (Signed)
The pharmacy called needing the Meloxicam prescription for the patient

## 2020-03-14 NOTE — Telephone Encounter (Signed)
Called patient and informed that prescription (Mobic-15 mg) has been sent to pharmacy on file and that she is to scheduled an upcoming appointment before any  further refills. She verbalized understanding.

## 2020-03-17 ENCOUNTER — Telehealth: Payer: Self-pay

## 2020-03-17 ENCOUNTER — Other Ambulatory Visit: Payer: Self-pay | Admitting: Family Medicine

## 2020-03-17 ENCOUNTER — Other Ambulatory Visit: Payer: Self-pay | Admitting: Internal Medicine

## 2020-03-17 DIAGNOSIS — I10 Essential (primary) hypertension: Secondary | ICD-10-CM

## 2020-03-17 NOTE — Telephone Encounter (Signed)
-----   Message from Germaine Pomfret, [facility] sent at 03/17/2020  8:04 AM EDT ----- Regarding: FW: Medication Question Deborah Wise,  Please inform Deborah Wise to set up an appointment at the office to discuss her recurrent urinary symptoms.   Thanks,  Alex  ----- Message ----- From: Isaac Bliss, Rayford Halsted, MD Sent: 03/15/2020   8:57 AM EDT To: Germaine Pomfret, RPH Subject: RE: Medication Question                        Deborah Wise, If she has recurrent dysuria she should set up a visit to discuss recurrent symptoms.  Thanks ----- Message ----- From: Germaine Pomfret, [facility] Sent: 03/13/2020   4:16 PM EDT To: Erline Hau, MD Subject: Medication Question                            Dr. Jerilee Hoh,  Patient was recently prescribed cephalexin for dysuria on 03/01/20. She requested today for a refill of this medication, although I was unable to speak with her about her symptoms. Would you be willing to send in a new Rx for that medication, or would you rather I ask her to set up a visit with you/ another provider to discuss her dysuria?   Thanks, Doristine Section Clinical Pharmacist [facility] [place]

## 2020-03-17 NOTE — Progress Notes (Addendum)
Per Clinical Pharmacist: Please inform Ms. Sigl to set up an appointment at the office to discuss her recurrent urinary symptoms.   Patient verbalized understanding.  [place] Pharmacist Assistant [phone]

## 2020-03-18 ENCOUNTER — Ambulatory Visit (INDEPENDENT_AMBULATORY_CARE_PROVIDER_SITE_OTHER): Payer: Medicare Other | Admitting: Cardiology

## 2020-03-18 ENCOUNTER — Encounter: Payer: Self-pay | Admitting: Cardiology

## 2020-03-18 ENCOUNTER — Encounter: Payer: Self-pay | Admitting: *Deleted

## 2020-03-18 ENCOUNTER — Other Ambulatory Visit: Payer: Self-pay

## 2020-03-18 VITALS — BP 140/80 | HR 72 | Ht 65.0 in | Wt 168.0 lb

## 2020-03-18 DIAGNOSIS — I2510 Atherosclerotic heart disease of native coronary artery without angina pectoris: Secondary | ICD-10-CM

## 2020-03-18 DIAGNOSIS — Z01812 Encounter for preprocedural laboratory examination: Secondary | ICD-10-CM | POA: Diagnosis not present

## 2020-03-18 DIAGNOSIS — I10 Essential (primary) hypertension: Secondary | ICD-10-CM | POA: Diagnosis not present

## 2020-03-18 DIAGNOSIS — I6523 Occlusion and stenosis of bilateral carotid arteries: Secondary | ICD-10-CM

## 2020-03-18 DIAGNOSIS — I209 Angina pectoris, unspecified: Secondary | ICD-10-CM

## 2020-03-18 MED ORDER — SODIUM CHLORIDE 0.9% FLUSH: 3.0000 mL | Freq: Two times a day (BID) | INTRAVENOUS | Status: DC

## 2020-03-18 NOTE — Progress Notes (Signed)
Cardiology Office Note:    Date:  03/18/2020   ID:  Deborah Wise, DOB 04/17/1951, MRN [id number]  PCP:  Hernandez Acosta, Estela Y, MD  [facility] Cardiologist:  No primary care provider on file.  [facility] Electrophysiologist:  None   Referring MD: Hernandez Acosta, Estel*     History of Present Illness:    Deborah Wise is a 69 y.o. female here for follow-up of results of cardiac CT. This ended up showing significant coronary artery calcification and possible severe flow-limiting coronary artery disease.  Clarification is necessary by cardiac catheterization.  She has been experiencing more more shortness of breath with activity.,  Could this be her anginal equivalent.  Question.  Results are as below:   03/11/2020: Coronary CT scan IMPRESSION: 1. Coronary calcium score of 2566. This was 99 percentile for age and sex matched control.  2. Normal coronary origin with right dominance.  3. Severe coronary disease in LAD and RCA which appears flow limiting. Calcified plaque in LM and Circumflex.  4. Aortic atherosclerosis.  5. Recommend cardiac catheterization.   Her original office visit was for evaluation of shortness of breath.  She had a stroke in 2018 with uncontrolled diabetes hyperlipidemia carotid artery disease cirrhosis hepatitis C.  Prior thrombocytopenia due to splenomegaly.  Dyspnea worse with steps going up stairs for instance.  No early family history of CAD.  Carotid Dopplers done in July 2021 showed right internal carotid of 60 to 79% stenosis in the left of 40 to 59% stenosis.  ECHO 03/12/20:   1. Left ventricular ejection fraction, by estimation, is 60 to 65%. The  left ventricle has normal function. The left ventricle has no regional  wall motion abnormalities. Left ventricular diastolic parameters are  consistent with Grade I diastolic  dysfunction (impaired relaxation).  2. Right ventricular systolic function is normal.  The right ventricular  size is normal. There is normal pulmonary artery systolic pressure. The  estimated right ventricular systolic pressure is 21.1 mmHg.  3. Left atrial size was mildly dilated.  4. The mitral valve is degenerative. No evidence of mitral valve  regurgitation. No evidence of mitral stenosis.  5. The aortic valve is tricuspid. There is mild calcification of the  aortic valve. Aortic valve regurgitation is not visualized. Mild aortic  valve sclerosis is present, with no evidence of aortic valve stenosis.  6. The inferior vena cava is normal in size with greater than 50%  respiratory variability, suggesting right atrial pressure of 3 mmHg.   Comparison(s): No significant change from prior study.   Past Medical History:  Diagnosis Date  . Anemia   . Arthritis   . Blood transfusion without reported diagnosis   . Carotid artery occlusion   . Cirrhosis (HCC)   . Diabetes mellitus without complication (HCC)   . Fibroid, uterine   . GERD (gastroesophageal reflux disease)   . Hepatitis    hep C  . Hypertension   . Stroke (HCC)   . Thrombocytopenia (HCC) 12/25/2014  . Urinary incontinence    Patient noticed mild and is not currently a significant problem    Past Surgical History:  Procedure Laterality Date  . ANKLE FRACTURE SURGERY Right 1999   x 2, rod  . COLONOSCOPY    . IR GENERIC HISTORICAL  06/30/2016   IR ANGIO VERTEBRAL SEL SUBCLAVIAN INNOMINATE UNI R MOD SED 06/30/2016 Sanjeev Deveshwar, MD [facility]INTER[facility]  . IR GENERIC HISTORICAL  06/30/2016   IR ANGIO VERTEBRAL SEL VERTEBRAL   UNI L MOD SED 06/30/2016 Sanjeev Deveshwar, MD [facility]  . IR GENERIC HISTORICAL  06/30/2016   IR ANGIO INTRA EXTRACRAN SEL COM CAROTID INNOMINATE BILAT MOD SED 06/30/2016 Sanjeev Deveshwar, MD [facility]  . MULTIPLE EXTRACTIONS WITH ALVEOLOPLASTY  10/25/2011   Procedure: MULTIPLE EXTRACION WITH ALVEOLOPLASTY;  Surgeon: Scott M Jensen, DDS;  Location: [facility] OR;  Service: Oral Surgery;   Laterality: Bilateral;  Extract teeth numbers one, two, six, seven, eight, nine, ten, eleven, twelve, fifteen, sixteen, eighteen, nineteen, twenty-three, twenty-four, twenty-five, twenty-seven, thirty-two and alveoplasty.    Current Medications: Current Meds  Medication Sig  . Accu-Chek FastClix Lancets MISC 1 each by Does not apply route daily.  . Accu-Chek Softclix Lancets lancets USE AS DIRECTED TO CHECK BLOOD SUGAR TWICE A DAY  . amLODipine (NORVASC) 10 MG tablet Take 1 tablet (10 mg total) by mouth daily.  . ASPIRIN LOW DOSE 81 MG EC tablet TAKE 1 TABLET BY MOUTH EVERY DAY  . Blood Glucose Monitoring Suppl (ACCU-CHEK GUIDE) w/Device KIT Please specify directions, refills and quantity  . Cholecalciferol 25 MCG (1000 UT) capsule Take 1,000 Units by mouth daily.   . cyanocobalamin 1000 MCG tablet Take 1,000 mcg by mouth daily.   . cyclobenzaprine (FLEXERIL) 10 MG tablet cyclobenzaprine 10 mg tablet  TAKE 1 TABLET BY MOUTH TWICE A DAY AS NEEDED FOR MUSCLE SPASMS  . diclofenac (VOLTAREN) 75 MG EC tablet Take 75 mg by mouth as needed.  . Evolocumab (REPATHA SURECLICK) 140 MG/ML SOAJ Inject 140 mg into the skin every 14 (fourteen) days. Must be seen for further refills. Call [phone] to schedule.  . glucose blood (ACCU-CHEK GUIDE) test strip 1 each by Other route in the morning and at bedtime. Dx E11.9  . hydrALAZINE (APRESOLINE) 25 MG tablet Take 25 mg by mouth daily.  . hydrochlorothiazide (HYDRODIURIL) 25 MG tablet Take 1 tablet (25 mg total) by mouth daily.  . HYDROcodone-acetaminophen (NORCO/VICODIN) 5-325 MG tablet Take 1 tablet by mouth every 6 (six) hours as needed.  . lisinopril (ZESTRIL) 20 MG tablet TAKE ONE TABLET BY MOUTH ONCE DAILY FOR blood pressure/heart  . meloxicam (MOBIC) 15 MG tablet Take 1 tablet (15 mg total) by mouth daily.  . metFORMIN (GLUCOPHAGE) 500 MG tablet Take 500 mg by mouth every morning.  . triamcinolone cream (KENALOG) 0.1 % APPLY TO AFFECTED AREA TWICE A  DAY   Current Facility-Administered Medications for the 03/18/20 encounter (Office Visit) with Elek Holderness C, MD  Medication  . sodium chloride flush (NS) 0.9 % injection 3 mL     Allergies:   Patient has no known allergies.   Social History   Socioeconomic History  . Marital status: Legally Separated    Spouse name: Not on file  . Number of children: 3  . Years of education: Not on file  . Highest education level: Not on file  Occupational History  . Occupation: disabled  Tobacco Use  . Smoking status: Never Smoker  . Smokeless tobacco: Never Used  Vaping Use  . Vaping Use: Never used  Substance and Sexual Activity  . Alcohol use: Yes    Alcohol/week: 1.0 standard drink    Types: 1 Cans of beer per week    Comment: 1/2 bottle of wine and some beer a day  . Drug use: Not Currently    Frequency: 1.0 times per week    Types: Cocaine    Comment: last use cocaine last year 2018  . Sexual activity: Not Currently  Other   Topics Concern  . Not on file  Social History Narrative  . Not on file   Social Determinants of Health   Financial Resource Strain: Low Risk   . Difficulty of Paying Living Expenses: Not hard at all  Food Insecurity:   . Worried About Running Out of Food in the Last Year: Not on file  . Ran Out of Food in the Last Year: Not on file  Transportation Needs: No Transportation Needs  . Lack of Transportation (Medical): No  . Lack of Transportation (Non-Medical): No  Physical Activity:   . Days of Exercise per Week: Not on file  . Minutes of Exercise per Session: Not on file  Stress:   . Feeling of Stress : Not on file  Social Connections:   . Frequency of Communication with Friends and Family: Not on file  . Frequency of Social Gatherings with Friends and Family: Not on file  . Attends Religious Services: Not on file  . Active Member of Clubs or Organizations: Not on file  . Attends Club or Organization Meetings: Not on file  . Marital Status: Not on  file     Family History: The patient's family history includes Cancer in her father; Diabetes in her daughter; Diabetes type II in her mother; Hypertension in her mother; Stroke in her mother. There is no history of Breast cancer, Colon polyps, Crohn's disease, Esophageal cancer, Rectal cancer, Stomach cancer, or Colon cancer.  ROS:   Please see the history of present illness.     All other systems reviewed and are negative.  EKGs/Labs/Other Studies Reviewed:     EKG:  EKG is  ordered today.  The ekg ordered today demonstrates sinus rhythm 72 with no other abnormalities, nonspecific ST-T wave changes.  Recent Labs: 03/05/2020: BUN 11; Creatinine, Ser 0.90; Potassium 4.1; Sodium 142  Recent Lipid Panel    Component Value Date/Time   CHOL 146 02/20/2020 0822   TRIG 116 02/20/2020 0822   HDL 52 02/20/2020 0822   CHOLHDL 2.8 02/20/2020 0822   CHOLHDL 5 06/22/2018 0901   VLDL 16.8 06/22/2018 0901   LDLCALC 73 02/20/2020 0822     Risk Assessment/Calculations:      Physical Exam:    VS:  BP 140/80   Pulse 72   Ht 5' 5" (1.651 m)   Wt 168 lb (76.2 kg)   SpO2 97%   BMI 27.96 kg/m     Wt Readings from Last 3 Encounters:  03/18/20 168 lb (76.2 kg)  03/01/20 166 lb (75.3 kg)  02/25/20 166 lb (75.3 kg)     GEN:  Well nourished, well developed in no acute distress HEENT: Normal NECK: No JVD; No carotid bruits LYMPHATICS: No lymphadenopathy CARDIAC: RRR, 2/6 systolic murmur, rubs, gallops RESPIRATORY:  Clear to auscultation without rales, wheezing or rhonchi  ABDOMEN: Soft, non-tender, non-distended MUSCULOSKELETAL:  No edema; No deformity  SKIN: Warm and dry NEUROLOGIC:  Alert and oriented x 3 PSYCHIATRIC:  Normal affect   ASSESSMENT:    1. Coronary artery disease involving native coronary artery of native heart without angina pectoris   2. Angina pectoris (HCC)   3. Essential hypertension   4. Bilateral carotid artery stenosis   5. Pre-procedure lab exam     PLAN:    In order of problems listed above:  Coronary artery disease/angina -Findings on CT scan revealed significant coronary artery calcification and potential flow-limiting disease.  We are going to proceed with cardiac catheterization.  Risk and benefits of   been explained including stroke heart attack death renal bleeding.  She is willing to proceed. -Shortness of breath may be her anginal equivalent.  Her echocardiogram was unremarkable.  Normal EF. -Orders have been placed for cardiac catheterization with Dr. Varanasi.  Peripheral vascular disease/carotid artery disease -Right carotid 79% left 59%.  Referred her back to see Dr. Brabham.  Hyperlipidemia -Now on Repatha.  Excellent.  LDL in September 73.  No side effects of medication.  Hypertension, -On lisinopril hydrochlorothiazide hydralazine amlodipine.  No changes right now on medical management.   Medication Adjustments/Labs and Tests Ordered: Current medicines are reviewed at length with the patient today.  Concerns regarding medicines are outlined above.  Orders Placed This Encounter  Procedures  . CBC  . Basic metabolic panel  . EKG 12-Lead   Meds ordered this encounter  Medications  . sodium chloride flush (NS) 0.9 % injection 3 mL    Patient Instructions  Medication Instructions:  No changes today *If you need a refill on your cardiac medications before your next appointment, please call your pharmacy*   Lab Work: Today: bmet, cbc If you have labs (blood work) drawn today and your tests are completely normal, you will receive your results only by: . MyChart Message (if you have MyChart) OR . A paper copy in the mail If you have any lab test that is abnormal or we need to change your treatment, we will call you to review the results.   Testing/Procedures: Your physician has requested that you have a cardiac catheterization. Cardiac catheterization is used to diagnose and/or treat various heart  conditions. Doctors may recommend this procedure for a number of different reasons. The most common reason is to evaluate chest pain. Chest pain can be a symptom of coronary artery disease (CAD), and cardiac catheterization can show whether plaque is narrowing or blocking your heart's arteries. This procedure is also used to evaluate the valves, as well as measure the blood flow and oxygen levels in different parts of your heart. For further information please visit www.cardiosmart.org. Please follow instruction sheet, as given.   Follow-Up: At [facility], you and your health needs are our priority.  As part of our continuing mission to provide you with exceptional heart care, we have created designated Provider Care Teams.  These Care Teams include your primary Cardiologist (physician) and Advanced Practice Providers (APPs -  Physician Assistants and Nurse Practitioners) who all work together to provide you with the care you need, when you need it.  We recommend signing up for the patient portal called "MyChart".  Sign up information is provided on this After Visit Summary.  MyChart is used to connect with patients for Virtual Visits (Telemedicine).  Patients are able to view lab/test results, encounter notes, upcoming appointments, etc.  Non-urgent messages can be sent to your provider as well.   To learn more about what you can do with MyChart, go to [email or web address].    Your next appointment:   4 week(s)  The format for your next appointment:   In Person  Provider:   You may see Avion Kutzer, MD or one of the following Advanced Practice Providers on your designated Care Team:    Lori Gerhardt, NP  Laura Ingold, NP  Jill McDaniel, NP    Other Instructions      Signed, Daylin Gruszka, MD  03/18/2020 3:54 PM     [facility] 

## 2020-03-18 NOTE — H&P (View-Only) (Signed)
Cardiology Office Note:    Date:  03/18/2020   ID:  Deborah, Wise 1951-05-08, MRN [id number]  PCP:  Isaac Bliss, Rayford Halsted, MD  [place] Cardiologist:  No primary care provider on file.  [facility] Electrophysiologist:  None   Referring MD: Isaac Bliss, [place]*     History of Present Illness:    Deborah Wise is a 69 y.o. female here for follow-up of results of cardiac CT. This ended up showing significant coronary artery calcification and possible severe flow-limiting coronary artery disease.  Clarification is necessary by cardiac catheterization.  She has been experiencing more more shortness of breath with activity.,  Could this be her anginal equivalent.  Question.  Results are as below:   03/11/2020: Coronary CT scan IMPRESSION: 1. Coronary calcium score of 2566. This was 75 percentile for age and sex matched control.  2. Normal coronary origin with right dominance.  3. Severe coronary disease in LAD and RCA which appears flow limiting. Calcified plaque in LM and Circumflex.  4. Aortic atherosclerosis.  5. Recommend cardiac catheterization.   Her original office visit was for evaluation of shortness of breath.  She had a stroke in 2018 with uncontrolled diabetes hyperlipidemia carotid artery disease cirrhosis hepatitis C.  Prior thrombocytopenia due to splenomegaly.  Dyspnea worse with steps going up stairs for instance.  No early family history of CAD.  Carotid Dopplers done in July 2021 showed right internal carotid of 60 to 79% stenosis in the left of 40 to 59% stenosis.  ECHO 03/12/20:   1. Left ventricular ejection fraction, by estimation, is 60 to 65%. The  left ventricle has normal function. The left ventricle has no regional  wall motion abnormalities. Left ventricular diastolic parameters are  consistent with Grade I diastolic  dysfunction (impaired relaxation).  2. Right ventricular systolic function is normal.  The right ventricular  size is normal. There is normal pulmonary artery systolic pressure. The  estimated right ventricular systolic pressure is 75.6 mmHg.  3. Left atrial size was mildly dilated.  4. The mitral valve is degenerative. No evidence of mitral valve  regurgitation. No evidence of mitral stenosis.  5. The aortic valve is tricuspid. There is mild calcification of the  aortic valve. Aortic valve regurgitation is not visualized. Mild aortic  valve sclerosis is present, with no evidence of aortic valve stenosis.  6. The inferior vena cava is normal in size with greater than 50%  respiratory variability, suggesting right atrial pressure of 3 mmHg.   Comparison(s): No significant change from prior study.   Past Medical History:  Diagnosis Date  . Anemia   . Arthritis   . Blood transfusion without reported diagnosis   . Carotid artery occlusion   . Cirrhosis ([place])   . Diabetes mellitus without complication ([place])   . Fibroid, uterine   . GERD (gastroesophageal reflux disease)   . Hepatitis    hep C  . Hypertension   . Stroke ([place])   . Thrombocytopenia ([place]) 12/25/2014  . Urinary incontinence    Patient noticed mild and is not currently a significant problem    Past Surgical History:  Procedure Laterality Date  . ANKLE FRACTURE SURGERY Right 1999   x 2, rod  . COLONOSCOPY    . IR GENERIC HISTORICAL  06/30/2016   IR ANGIO VERTEBRAL SEL SUBCLAVIAN INNOMINATE UNI R MOD SED 06/30/2016 [name], MD [facility]  . IR GENERIC HISTORICAL  06/30/2016   IR ANGIO VERTEBRAL SEL VERTEBRAL  UNI L MOD SED 06/30/2016 [name], MD [facility]  . IR GENERIC HISTORICAL  06/30/2016   IR ANGIO INTRA EXTRACRAN SEL COM CAROTID INNOMINATE BILAT MOD SED 06/30/2016 [name], MD [facility]  . MULTIPLE EXTRACTIONS WITH ALVEOLOPLASTY  10/25/2011   Procedure: MULTIPLE EXTRACION WITH ALVEOLOPLASTY;  Surgeon: Gae Bon, DDS;  Location: [place];  Service: Oral Surgery;   Laterality: Bilateral;  Extract teeth numbers one, two, six, seven, eight, nine, ten, eleven, twelve, fifteen, sixteen, eighteen, nineteen, twenty-three, twenty-four, twenty-five, twenty-seven, thirty-two and alveoplasty.    Current Medications: Current Meds  Medication Sig  . Accu-Chek FastClix Lancets MISC 1 each by Does not apply route daily.  . Accu-Chek Softclix Lancets lancets USE AS DIRECTED TO CHECK BLOOD SUGAR TWICE A DAY  . amLODipine (NORVASC) 10 MG tablet Take 1 tablet (10 mg total) by mouth daily.  . ASPIRIN LOW DOSE 81 MG EC tablet TAKE 1 TABLET BY MOUTH EVERY DAY  . Blood Glucose Monitoring Suppl (ACCU-CHEK GUIDE) w/Device KIT Please specify directions, refills and quantity  . Cholecalciferol 25 MCG (1000 UT) capsule Take 1,000 Units by mouth daily.   . cyanocobalamin 1000 MCG tablet Take 1,000 mcg by mouth daily.   . cyclobenzaprine (FLEXERIL) 10 MG tablet cyclobenzaprine 10 mg tablet  TAKE 1 TABLET BY MOUTH TWICE A DAY AS NEEDED FOR MUSCLE SPASMS  . diclofenac (VOLTAREN) 75 MG EC tablet Take 75 mg by mouth as needed.  . Evolocumab (REPATHA SURECLICK) 242 MG/ML SOAJ Inject 140 mg into the skin every 14 (fourteen) days. Must be seen for further refills. Call [phone] to schedule.  [name] Kitchen glucose blood (ACCU-CHEK GUIDE) test strip 1 each by Other route in the morning and at bedtime. Dx E11.9  . hydrALAZINE (APRESOLINE) 25 MG tablet Take 25 mg by mouth daily.  . hydrochlorothiazide (HYDRODIURIL) 25 MG tablet Take 1 tablet (25 mg total) by mouth daily.  [name] Kitchen HYDROcodone-acetaminophen (NORCO/VICODIN) 5-325 MG tablet Take 1 tablet by mouth every 6 (six) hours as needed.  [name] Kitchen lisinopril (ZESTRIL) 20 MG tablet TAKE ONE TABLET BY MOUTH ONCE DAILY FOR blood pressure/heart  . meloxicam (MOBIC) 15 MG tablet Take 1 tablet (15 mg total) by mouth daily.  . metFORMIN (GLUCOPHAGE) 500 MG tablet Take 500 mg by mouth every morning.  . triamcinolone cream (KENALOG) 0.1 % APPLY TO AFFECTED AREA TWICE A  DAY   Current Facility-Administered Medications for the 03/18/20 encounter (Office Visit) with Jerline Pain, MD  Medication  . sodium chloride flush (NS) 0.9 % injection 3 mL     Allergies:   Patient has no known allergies.   Social History   Socioeconomic History  . Marital status: Legally Separated    Spouse name: Not on file  . Number of children: 3  . Years of education: Not on file  . Highest education level: Not on file  Occupational History  . Occupation: disabled  Tobacco Use  . Smoking status: Never Smoker  . Smokeless tobacco: Never Used  Vaping Use  . Vaping Use: Never used  Substance and Sexual Activity  . Alcohol use: Yes    Alcohol/week: 1.0 standard drink    Types: 1 Cans of beer per week    Comment: 1/2 bottle of wine and some beer a day  . Drug use: Not Currently    Frequency: 1.0 times per week    Types: Cocaine    Comment: last use cocaine last year 2018  . Sexual activity: Not Currently  Other  Topics Concern  . Not on file  Social History Narrative  . Not on file   Social Determinants of Health   Financial Resource Strain: Low Risk   . Difficulty of Paying Living Expenses: Not hard at all  Food Insecurity:   . Worried About Charity fundraiser in the Last Year: Not on file  . Ran Out of Food in the Last Year: Not on file  Transportation Needs: No Transportation Needs  . Lack of Transportation (Medical): No  . Lack of Transportation (Non-Medical): No  Physical Activity:   . Days of Exercise per Week: Not on file  . Minutes of Exercise per Session: Not on file  Stress:   . Feeling of Stress : Not on file  Social Connections:   . Frequency of Communication with Friends and Family: Not on file  . Frequency of Social Gatherings with Friends and Family: Not on file  . Attends Religious Services: Not on file  . Active Member of Clubs or Organizations: Not on file  . Attends Archivist Meetings: Not on file  . Marital Status: Not on  file     Family History: The patient's family history includes Cancer in her father; Diabetes in her daughter; Diabetes type II in her mother; Hypertension in her mother; Stroke in her mother. There is no history of Breast cancer, Colon polyps, Crohn's disease, Esophageal cancer, Rectal cancer, Stomach cancer, or Colon cancer.  ROS:   Please see the history of present illness.     All other systems reviewed and are negative.  EKGs/Labs/Other Studies Reviewed:     EKG:  EKG is  ordered today.  The ekg ordered today demonstrates sinus rhythm 72 with no other abnormalities, nonspecific ST-T wave changes.  Recent Labs: 03/05/2020: BUN 11; Creatinine, Ser 0.90; Potassium 4.1; Sodium 142  Recent Lipid Panel    Component Value Date/Time   CHOL 146 02/20/2020 0822   TRIG 116 02/20/2020 0822   HDL 52 02/20/2020 0822   CHOLHDL 2.8 02/20/2020 0822   CHOLHDL 5 06/22/2018 0901   VLDL 16.8 06/22/2018 0901   LDLCALC 73 02/20/2020 0822     Risk Assessment/Calculations:      Physical Exam:    VS:  BP 140/80   Pulse 72   Ht 5' 5" (1.651 m)   Wt 168 lb (76.2 kg)   SpO2 97%   BMI 27.96 kg/m     Wt Readings from Last 3 Encounters:  03/18/20 168 lb (76.2 kg)  03/01/20 166 lb (75.3 kg)  02/25/20 166 lb (75.3 kg)     GEN:  Well nourished, well developed in no acute distress HEENT: Normal NECK: No JVD; No carotid bruits LYMPHATICS: No lymphadenopathy CARDIAC: RRR, 2/6 systolic murmur, rubs, gallops RESPIRATORY:  Clear to auscultation without rales, wheezing or rhonchi  ABDOMEN: Soft, non-tender, non-distended MUSCULOSKELETAL:  No edema; No deformity  SKIN: Warm and dry NEUROLOGIC:  Alert and oriented x 3 PSYCHIATRIC:  Normal affect   ASSESSMENT:    1. Coronary artery disease involving native coronary artery of native heart without angina pectoris   2. Angina pectoris ([place])   3. Essential hypertension   4. Bilateral carotid artery stenosis   5. Pre-procedure lab exam     PLAN:    In order of problems listed above:  Coronary artery disease/angina -Findings on CT scan revealed significant coronary artery calcification and potential flow-limiting disease.  We are going to proceed with cardiac catheterization.  Risk and benefits of  been explained including stroke heart attack death renal bleeding.  She is willing to proceed. -Shortness of breath may be her anginal equivalent.  Her echocardiogram was unremarkable.  Normal EF. -Orders have been placed for cardiac catheterization with Dr. Irish Lack.  Peripheral vascular disease/carotid artery disease -Right carotid 79% left 59%.  Referred her back to see Dr. Trula Slade.  Hyperlipidemia -Now on Repatha.  Excellent.  LDL in September 73.  No side effects of medication.  Hypertension, -On lisinopril hydrochlorothiazide hydralazine amlodipine.  No changes right now on medical management.   Medication Adjustments/Labs and Tests Ordered: Current medicines are reviewed at length with the patient today.  Concerns regarding medicines are outlined above.  Orders Placed This Encounter  Procedures  . CBC  . Basic metabolic panel  . EKG 12-Lead   Meds ordered this encounter  Medications  . sodium chloride flush (NS) 0.9 % injection 3 mL    Patient Instructions  Medication Instructions:  No changes today *If you need a refill on your cardiac medications before your next appointment, please call your pharmacy*   Lab Work: Today: bmet, cbc If you have labs (blood work) drawn today and your tests are completely normal, you will receive your results only by: [name] Kitchen MyChart Message (if you have MyChart) OR . A paper copy in the mail If you have any lab test that is abnormal or we need to change your treatment, we will call you to review the results.   Testing/Procedures: Your physician has requested that you have a cardiac catheterization. Cardiac catheterization is used to diagnose and/or treat various heart  conditions. Doctors may recommend this procedure for a number of different reasons. The most common reason is to evaluate chest pain. Chest pain can be a symptom of coronary artery disease (CAD), and cardiac catheterization can show whether plaque is narrowing or blocking your heart's arteries. This procedure is also used to evaluate the valves, as well as measure the blood flow and oxygen levels in different parts of your heart. For further information please visit HugeFiesta.tn. Please follow instruction sheet, as given.   Follow-Up: At [facility], you and your health needs are our priority.  As part of our continuing mission to provide you with exceptional heart care, we have created designated Provider Care Teams.  These Care Teams include your primary Cardiologist (physician) and Advanced Practice Providers (APPs -  Physician Assistants and Nurse Practitioners) who all work together to provide you with the care you need, when you need it.  We recommend signing up for the patient portal called "MyChart".  Sign up information is provided on this After Visit Summary.  MyChart is used to connect with patients for Virtual Visits (Telemedicine).  Patients are able to view lab/test results, encounter notes, upcoming appointments, etc.  Non-urgent messages can be sent to your provider as well.   To learn more about what you can do with MyChart, go to [email or web address].    Your next appointment:   4 week(s)  The format for your next appointment:   In Person  Provider:   You may see Candee Furbish, MD or one of the following Advanced Practice Providers on your designated Care Team:    Truitt Merle, NP  Cecilie Kicks, NP  Kathyrn Drown, NP    Other Instructions      Signed, Candee Furbish, MD  03/18/2020 3:54 PM    [place]

## 2020-03-18 NOTE — Patient Instructions (Signed)
Medication Instructions:  No changes today *If you need a refill on your cardiac medications before your next appointment, please call your pharmacy*   Lab Work: Today: bmet, cbc If you have labs (blood work) drawn today and your tests are completely normal, you will receive your results only by: [name] Kitchen MyChart Message (if you have MyChart) OR . A paper copy in the mail If you have any lab test that is abnormal or we need to change your treatment, we will call you to review the results.   Testing/Procedures: Your physician has requested that you have a cardiac catheterization. Cardiac catheterization is used to diagnose and/or treat various heart conditions. Doctors may recommend this procedure for a number of different reasons. The most common reason is to evaluate chest pain. Chest pain can be a symptom of coronary artery disease (CAD), and cardiac catheterization can show whether plaque is narrowing or blocking your heart's arteries. This procedure is also used to evaluate the valves, as well as measure the blood flow and oxygen levels in different parts of your heart. For further information please visit HugeFiesta.[email or web address]. Please follow instruction sheet, as given.   Follow-Up: At [facility], you and your health needs are our priority.  As part of our continuing mission to provide you with exceptional heart care, we have created designated Provider Care Teams.  These Care Teams include your primary Cardiologist (physician) and Advanced Practice Providers (APPs -  Physician Assistants and Nurse Practitioners) who all work together to provide you with the care you need, when you need it.  We recommend signing up for the patient portal called "MyChart".  Sign up information is provided on this After Visit Summary.  MyChart is used to connect with patients for Virtual Visits (Telemedicine).  Patients are able to view lab/test results, encounter notes, upcoming appointments, etc.  Non-urgent  messages can be sent to your provider as well.   To learn more about what you can do with MyChart, go to [email or web address].    Your next appointment:   4 week(s)  The format for your next appointment:   In Person  Provider:   You may see Candee Furbish, MD or one of the following Advanced Practice Providers on your designated Care Team:    Truitt Merle, NP  Cecilie Kicks, NP  Kathyrn Drown, NP    Other Instructions

## 2020-03-19 ENCOUNTER — Ambulatory Visit (HOSPITAL_COMMUNITY)
Admission: RE | Admit: 2020-03-19 | Discharge: 2020-03-19 | Disposition: A | Discharge status: Home / Self Care | Payer: Medicare Other | Source: Ambulatory Visit | Attending: Cardiology | Admitting: Cardiology

## 2020-03-19 DIAGNOSIS — M79605 Pain in left leg: Secondary | ICD-10-CM | POA: Diagnosis not present

## 2020-03-19 DIAGNOSIS — M79604 Pain in right leg: Secondary | ICD-10-CM | POA: Diagnosis not present

## 2020-03-19 LAB — BASIC METABOLIC PANEL
BUN/Creatinine Ratio: 16 (ref 12–28)
BUN: 13 mg/dL (ref 8–27)
CO2: 23 mmol/L (ref 20–29)
Calcium: 10.1 mg/dL (ref 8.7–10.3)
Chloride: 103 mmol/L (ref 96–106)
Creatinine, Ser: 0.83 mg/dL (ref 0.57–1.00)
GFR calc Af Amer: 84 mL/min/{1.73_m2} (ref >59)
GFR calc non Af Amer: 73 mL/min/{1.73_m2} (ref >59)
Glucose: 87 mg/dL (ref 65–99)
Potassium: 3.7 mmol/L (ref 3.5–5.2)
Sodium: 143 mmol/L (ref 134–144)

## 2020-03-19 LAB — CBC
Hematocrit: 37.4 % (ref 34.0–46.6)
Hemoglobin: 12.1 g/dL (ref 11.1–15.9)
MCH: 24.6 pg — ABNORMAL LOW (ref 26.6–33.0)
MCHC: 32.4 g/dL (ref 31.5–35.7)
MCV: 76 fL — ABNORMAL LOW (ref 79–97)
Platelets: 137 10*3/uL — ABNORMAL LOW (ref 150–450)
RBC: 4.92 x10E6/uL (ref 3.77–5.28)
RDW: 15.8 % — ABNORMAL HIGH (ref 11.7–15.4)
WBC: 6.2 10*3/uL (ref 3.4–10.8)

## 2020-03-21 ENCOUNTER — Telehealth: Payer: Self-pay | Admitting: Cardiovascular Disease

## 2020-03-21 NOTE — Telephone Encounter (Signed)
Spoke to patient she stated she is scheduled for a cardiac cath 10/19.She wanted to know how many people are allowed to go with her and if she would be put to sleep.Advised 1 person is allowed to go with her.She will be lightly sedated.Patient was reassured.

## 2020-03-21 NOTE — Telephone Encounter (Signed)
Patient has questions about procedure coming up - Does she need to bring someone and will she be put to sleep.

## 2020-03-22 ENCOUNTER — Other Ambulatory Visit (HOSPITAL_COMMUNITY)
Admission: RE | Admit: 2020-03-22 | Discharge: 2020-03-22 | Disposition: A | Discharge status: Home / Self Care | Payer: Medicare Other | Source: Ambulatory Visit | Attending: Interventional Cardiology | Admitting: Interventional Cardiology

## 2020-03-22 DIAGNOSIS — Z20822 Contact with and (suspected) exposure to covid-19: Secondary | ICD-10-CM | POA: Diagnosis not present

## 2020-03-22 DIAGNOSIS — Z01812 Encounter for preprocedural laboratory examination: Secondary | ICD-10-CM | POA: Insufficient documentation

## 2020-03-23 LAB — SARS CORONAVIRUS 2 (TAT 6-24 HRS): SARS Coronavirus 2: NEGATIVE

## 2020-03-24 ENCOUNTER — Telehealth: Payer: Self-pay | Admitting: Cardiology

## 2020-03-24 ENCOUNTER — Telehealth: Payer: Self-pay | Admitting: *Deleted

## 2020-03-24 NOTE — Telephone Encounter (Signed)
Pt contacted pre-catheterization scheduled at [facility] for: Tuesday March 25, 2020 7:30 AM Verified arrival time and place: [facility]) at: 5:30 AM   No solid food after midnight prior to cath, clear liquids until 5 AM day of procedure.  Hold: Metformin-day of procedure and 48 hours post procedure HCTZ-AM of procedure Chlorthalidone-AM of procedure  Except hold medications AM meds can be  taken pre-cath with sips of water including: ASA 81 mg   Confirmed patient has responsible adult to drive home post procedure and be with patient first 24 hours after arriving home:  You are allowed ONE visitor in the waiting room during the time you are at the hospital for your procedure. Both you and your visitor must wear a mask once you enter the hospital.       COVID-19 Pre-Screening Questions:  . In the past 14 days have you had a new cough, new headache, new nasal congestion, fever (100.4 or greater) unexplained body aches, new sore throat, or sudden loss of taste or sense of smell? no . In the past 14 days have you been around anyone with known Covid 19? no . Have you been vaccinated for COVID-19? Yes, see immunization history  Reviewed procedure/mask/visitor, COVID-19 questions with patient.

## 2020-03-24 NOTE — Telephone Encounter (Signed)
    Spoke with patient regarding change of time of her procedure. [facility] will now be at 10:30 on 03/25/20 with Dr. Irish Lack as opposed to 7:30am. She understands that she will need to arrive by [place] [facility] Pager: [phone]

## 2020-03-25 ENCOUNTER — Encounter (HOSPITAL_COMMUNITY)
Admission: RE | Disposition: A | Discharge status: Home / Self Care | Payer: Self-pay | Source: Ambulatory Visit | Attending: Interventional Cardiology

## 2020-03-25 ENCOUNTER — Ambulatory Visit (HOSPITAL_COMMUNITY): Payer: Medicare Other

## 2020-03-25 ENCOUNTER — Ambulatory Visit (HOSPITAL_COMMUNITY)
Admission: RE | Admit: 2020-03-25 | Discharge: 2020-03-25 | Disposition: A | Discharge status: Home / Self Care | Payer: Medicare Other | Source: Ambulatory Visit | Attending: Interventional Cardiology | Admitting: Interventional Cardiology

## 2020-03-25 ENCOUNTER — Other Ambulatory Visit: Payer: Self-pay

## 2020-03-25 DIAGNOSIS — Z79899 Other long term (current) drug therapy: Secondary | ICD-10-CM | POA: Insufficient documentation

## 2020-03-25 DIAGNOSIS — Z7982 Long term (current) use of aspirin: Secondary | ICD-10-CM | POA: Diagnosis not present

## 2020-03-25 DIAGNOSIS — Z7984 Long term (current) use of oral hypoglycemic drugs: Secondary | ICD-10-CM | POA: Insufficient documentation

## 2020-03-25 DIAGNOSIS — I6523 Occlusion and stenosis of bilateral carotid arteries: Secondary | ICD-10-CM | POA: Diagnosis not present

## 2020-03-25 DIAGNOSIS — M79641 Pain in right hand: Secondary | ICD-10-CM

## 2020-03-25 DIAGNOSIS — I25118 Atherosclerotic heart disease of native coronary artery with other forms of angina pectoris: Secondary | ICD-10-CM

## 2020-03-25 DIAGNOSIS — I2510 Atherosclerotic heart disease of native coronary artery without angina pectoris: Secondary | ICD-10-CM

## 2020-03-25 DIAGNOSIS — I700 Atherosclerosis of aorta: Secondary | ICD-10-CM | POA: Insufficient documentation

## 2020-03-25 DIAGNOSIS — W19XXXA Unspecified fall, initial encounter: Secondary | ICD-10-CM | POA: Insufficient documentation

## 2020-03-25 DIAGNOSIS — K219 Gastro-esophageal reflux disease without esophagitis: Secondary | ICD-10-CM | POA: Diagnosis not present

## 2020-03-25 DIAGNOSIS — I209 Angina pectoris, unspecified: Secondary | ICD-10-CM

## 2020-03-25 DIAGNOSIS — E785 Hyperlipidemia, unspecified: Secondary | ICD-10-CM | POA: Diagnosis not present

## 2020-03-25 DIAGNOSIS — Z8673 Personal history of transient ischemic attack (TIA), and cerebral infarction without residual deficits: Secondary | ICD-10-CM | POA: Diagnosis not present

## 2020-03-25 DIAGNOSIS — E1151 Type 2 diabetes mellitus with diabetic peripheral angiopathy without gangrene: Secondary | ICD-10-CM | POA: Diagnosis not present

## 2020-03-25 DIAGNOSIS — R0602 Shortness of breath: Secondary | ICD-10-CM | POA: Insufficient documentation

## 2020-03-25 DIAGNOSIS — S62636A Displaced fracture of distal phalanx of right little finger, initial encounter for closed fracture: Secondary | ICD-10-CM | POA: Diagnosis not present

## 2020-03-25 DIAGNOSIS — I25119 Atherosclerotic heart disease of native coronary artery with unspecified angina pectoris: Secondary | ICD-10-CM | POA: Insufficient documentation

## 2020-03-25 DIAGNOSIS — I10 Essential (primary) hypertension: Secondary | ICD-10-CM

## 2020-03-25 DIAGNOSIS — Z01812 Encounter for preprocedural laboratory examination: Secondary | ICD-10-CM

## 2020-03-25 DIAGNOSIS — M79643 Pain in unspecified hand: Secondary | ICD-10-CM

## 2020-03-25 HISTORY — PX: LEFT HEART CATH AND CORONARY ANGIOGRAPHY: CATH118249

## 2020-03-25 LAB — GLUCOSE, CAPILLARY: Glucose-Capillary: 118 mg/dL — ABNORMAL HIGH (ref 70–99)

## 2020-03-25 SURGERY — LEFT HEART CATH AND CORONARY ANGIOGRAPHY
Anesthesia: LOCAL

## 2020-03-25 MED ORDER — ONDANSETRON HCL 4 MG/2ML IJ SOLN: 4.0000 mg | Freq: Four times a day (QID) | INTRAMUSCULAR | Status: DC | PRN

## 2020-03-25 MED ORDER — LABETALOL HCL 5 MG/ML IV SOLN: 10.0000 mg | INTRAVENOUS | Status: DC | PRN

## 2020-03-25 MED ORDER — HYDRALAZINE HCL 20 MG/ML IJ SOLN: 10.0000 mg | INTRAMUSCULAR | Status: DC | PRN

## 2020-03-25 MED ORDER — MIDAZOLAM HCL 2 MG/2ML IJ SOLN
INTRAMUSCULAR | Status: DC | PRN
  Administered 2020-03-25: 2 mg via INTRAVENOUS

## 2020-03-25 MED ORDER — SODIUM CHLORIDE 0.9% FLUSH: 3.0000 mL | INTRAVENOUS | Status: DC | PRN

## 2020-03-25 MED ORDER — SODIUM CHLORIDE 0.9 % IV SOLN: INTRAVENOUS | Status: DC

## 2020-03-25 MED ORDER — HEPARIN SODIUM (PORCINE) 1000 UNIT/ML IJ SOLN
INTRAMUSCULAR | Status: AC
  Filled 2020-03-25: qty 1

## 2020-03-25 MED ORDER — HEPARIN SODIUM (PORCINE) 1000 UNIT/ML IJ SOLN
INTRAMUSCULAR | Status: DC | PRN
  Administered 2020-03-25: 4000 [IU] via INTRAVENOUS

## 2020-03-25 MED ORDER — SODIUM CHLORIDE 0.9 % IV SOLN: 250.0000 mL | INTRAVENOUS | Status: DC | PRN

## 2020-03-25 MED ORDER — MIDAZOLAM HCL 2 MG/2ML IJ SOLN
INTRAMUSCULAR | Status: AC
  Filled 2020-03-25: qty 2

## 2020-03-25 MED ORDER — IOHEXOL 350 MG/ML SOLN
INTRAVENOUS | Status: DC | PRN
  Administered 2020-03-25: 70 mL

## 2020-03-25 MED ORDER — VERAPAMIL HCL 2.5 MG/ML IV SOLN
INTRAVENOUS | Status: DC | PRN
  Administered 2020-03-25: 10 mL via INTRA_ARTERIAL

## 2020-03-25 MED ORDER — FENTANYL CITRATE (PF) 100 MCG/2ML IJ SOLN
INTRAMUSCULAR | Status: AC
  Filled 2020-03-25: qty 2

## 2020-03-25 MED ORDER — METFORMIN HCL 500 MG PO TABS
500.0000 mg | ORAL_TABLET | Freq: Every day | ORAL | Status: DC
Start: 2020-03-27 — End: 2020-10-07

## 2020-03-25 MED ORDER — HEPARIN (PORCINE) IN NACL 1000-0.9 UT/500ML-% IV SOLN
INTRAVENOUS | Status: DC | PRN
  Administered 2020-03-25 (×2): 500 mL

## 2020-03-25 MED ORDER — SODIUM CHLORIDE 0.9 % WEIGHT BASED INFUSION
3.0000 mL/kg/h | INTRAVENOUS | Status: AC
  Administered 2020-03-25: 3 mL/kg/h via INTRAVENOUS

## 2020-03-25 MED ORDER — ACETAMINOPHEN 325 MG PO TABS: 650.0000 mg | ORAL_TABLET | ORAL | Status: DC | PRN

## 2020-03-25 MED ORDER — LIDOCAINE HCL (PF) 1 % IJ SOLN
INTRAMUSCULAR | Status: DC | PRN
  Administered 2020-03-25: 2 mL

## 2020-03-25 MED ORDER — VERAPAMIL HCL 2.5 MG/ML IV SOLN
INTRAVENOUS | Status: AC
  Filled 2020-03-25: qty 2

## 2020-03-25 MED ORDER — SODIUM CHLORIDE 0.9% FLUSH: 3.0000 mL | Freq: Two times a day (BID) | INTRAVENOUS | Status: DC

## 2020-03-25 MED ORDER — ASPIRIN 81 MG PO CHEW: 81.0000 mg | CHEWABLE_TABLET | ORAL | Status: DC

## 2020-03-25 MED ORDER — HYDRALAZINE HCL 20 MG/ML IJ SOLN
INTRAMUSCULAR | Status: DC | PRN
  Administered 2020-03-25: 20 mg via INTRAVENOUS

## 2020-03-25 MED ORDER — FENTANYL CITRATE (PF) 100 MCG/2ML IJ SOLN
INTRAMUSCULAR | Status: DC | PRN
  Administered 2020-03-25: 50 ug via INTRAVENOUS

## 2020-03-25 MED ORDER — HYDRALAZINE HCL 20 MG/ML IJ SOLN
INTRAMUSCULAR | Status: AC
  Filled 2020-03-25: qty 1

## 2020-03-25 MED ORDER — SODIUM CHLORIDE 0.9 % WEIGHT BASED INFUSION: 1.0000 mL/kg/h | INTRAVENOUS | Status: DC

## 2020-03-25 MED ORDER — LIDOCAINE HCL (PF) 1 % IJ SOLN
INTRAMUSCULAR | Status: AC
  Filled 2020-03-25: qty 30

## 2020-03-25 MED ORDER — HEPARIN (PORCINE) IN NACL 1000-0.9 UT/500ML-% IV SOLN
INTRAVENOUS | Status: AC
  Filled 2020-03-25: qty 1000

## 2020-03-25 SURGICAL SUPPLY — 12 items
CATH 5FR JL3.5 JR4 ANG PIG MP (CATHETERS) ×1 IMPLANT
CATH INFINITI 5 FR 3DRC (CATHETERS) ×1 IMPLANT
CATH INFINITI 5FR AL1 (CATHETERS) ×1 IMPLANT
DEVICE RAD COMP TR BAND LRG (VASCULAR PRODUCTS) ×1 IMPLANT
GLIDESHEATH SLEND SS 6F .021 (SHEATH) ×2 IMPLANT
GUIDEWIRE INQWIRE 1.5J.035X260 (WIRE) IMPLANT
INQWIRE 1.5J .035X260CM (WIRE) ×2
KIT HEART LEFT (KITS) ×2 IMPLANT
PACK CARDIAC CATHETERIZATION (CUSTOM PROCEDURE TRAY) ×2 IMPLANT
SHEATH PROBE COVER 6X72 (BAG) ×1 IMPLANT
TRANSDUCER W/STOPCOCK (MISCELLANEOUS) ×2 IMPLANT
TUBING CIL FLEX 10 FLL-RA (TUBING) ×2 IMPLANT

## 2020-03-25 NOTE — Progress Notes (Addendum)
Pt c/o pain in right hand swelling noted.Pt states she fell last Friday at home.  Dr Irish Lack in to see pt and informed new orders noted.

## 2020-03-25 NOTE — Progress Notes (Signed)
Ortho tech called and informed of new order for splint to right hand.

## 2020-03-25 NOTE — Progress Notes (Signed)
Orthopedic Tech Progress Note Patient Details:  Deborah Wise 24-Aug-1950 [id number] RN said she would apply VELCRO SPLINT to patient once she has whatever on arm removed. Ortho Devices Type of Ortho Device: Velcro wrist splint Ortho Device/Splint Location: RUE Ortho Device/Splint Interventions: Ordered, Other (comment)   Post Interventions Patient Tolerated: Well Instructions Provided: Care of device   Janit Pagan 03/25/2020, 2:27 PM

## 2020-03-25 NOTE — Discharge Instructions (Signed)
Appt with [facility] on Thursday 03/27/2020 @ 11:00 with Dr Amedeo Plenty, regarding broken finger.  Any questions call [phone]  DRINK PLENTY OF FLUIDS FOR THE NEXT 2-3 DAYS.  KEEP ARM ELEVATED THE REMAINDER OF THE DAY.  HOLD METFORMIN FOR A FULL 48 HOURS AFTER DISCHARGE.   Radial Site Care  This sheet gives you information about how to care for yourself after your procedure. Your health care provider may also give you more specific instructions. If you have problems or questions, contact your health care provider. What can I expect after the procedure? After the procedure, it is common to have:  Bruising and tenderness at the catheter insertion area. Follow these instructions at home: Medicines  Take over-the-counter and prescription medicines only as told by your health care provider. Insertion site care 1. Follow instructions from your health care provider about how to take care of your insertion site. Make sure you: ? Wash your hands with soap and water before you change your bandage (dressing). If soap and water are not available, use hand sanitizer. ? Change your dressing as told by your health care provider. 2. Check your insertion site every day for signs of infection. Check for: ? Redness, swelling, or pain. ? Fluid or blood. ? Pus or a bad smell. ? Warmth. 3. Do not take baths, swim, or use a hot tub for 5 days. 4. You may shower 24-48 hours after the procedure. ? Remove the dressing and gently wash the site with plain soap and water. ? Pat the area dry with a clean towel. ? Do not rub the site. That could cause bleeding. 5. Do not apply powder or lotion to the site. Activity  1. For 24 hours after the procedure, or as directed by your health care provider: ? Do not flex or bend the affected arm. ? Do not push or pull heavy objects with the affected arm. ? Do not drive yourself home from the hospital or clinic. You may drive 24 hours after the procedure. ? Do not operate  machinery or power tools. 2. Do not push, pull or lift anything that is heavier than 10 lb for 5 days. 3. Ask your health care provider when it is okay to: ? Return to work or school. ? Resume usual physical activities or sports. ? Resume sexual activity. General instructions  If the catheter site starts to bleed, raise your arm and put firm pressure on the site. If the bleeding does not stop, get help right away. This is a medical emergency.  If you went home on the same day as your procedure, a responsible adult should be with you for the first 24 hours after you arrive home.  Keep all follow-up visits as told by your health care provider. This is important. Contact a health care provider if:  You have a fever.  You have redness, swelling, or yellow drainage around your insertion site. Get help right away if:  You have unusual pain at the radial site.  The catheter insertion area swells very fast.  The insertion area is bleeding, and the bleeding does not stop when you hold steady pressure on the area.  Your arm or hand becomes pale, cool, tingly, or numb. These symptoms may represent a serious problem that is an emergency. Do not wait to see if the symptoms will go away. Get medical help right away. Call your local emergency services (911 in the [place] Do not drive yourself to the hospital. Summary  After  the procedure, it is common to have bruising and tenderness at the site.  Follow instructions from your health care provider about how to take care of your radial site wound. Check the wound every day for signs of infection.  Do not push, pull or lift anything that is heavier than 10 lb for 5 days.  This information is not intended to replace advice given to you by your health care provider. Make sure you discuss any questions you have with your health care provider. Document Revised: 06/29/2017 Document Reviewed: 06/29/2017 Elsevier Patient Education  2020 [employer].

## 2020-03-25 NOTE — Interval H&P Note (Signed)
Cath Lab Visit (complete for each Cath Lab visit)  Clinical Evaluation Leading to the Procedure:   ACS: No.  Non-ACS:    Anginal Classification: CCS III  Anti-ischemic medical therapy: Minimal Therapy (1 class of medications)  Non-Invasive Test Results: High-risk stress test findings: cardiac mortality >3%/year  Prior CABG: No previous CABG   Multivessel CAD by CT scan   History and Physical Interval Note:  03/25/2020 12:53 PM  Deborah Wise  has presented today for surgery, with the diagnosis of cad - shortness of breath.  The various methods of treatment have been discussed with the patient and family. After consideration of risks, benefits and other options for treatment, the patient has consented to  Procedure(s): LEFT HEART CATH AND CORONARY ANGIOGRAPHY (N/A) as a surgical intervention.  The patient's history has been reviewed, patient examined, no change in status, stable for surgery.  I have reviewed the patient's chart and labs.  Questions were answered to the patient's satisfaction.     Larae Grooms

## 2020-03-26 ENCOUNTER — Encounter (HOSPITAL_COMMUNITY): Payer: Self-pay | Admitting: Interventional Cardiology

## 2020-03-27 DIAGNOSIS — M79642 Pain in left hand: Secondary | ICD-10-CM | POA: Diagnosis not present

## 2020-03-27 DIAGNOSIS — S62306A Unspecified fracture of fifth metacarpal bone, right hand, initial encounter for closed fracture: Secondary | ICD-10-CM | POA: Diagnosis not present

## 2020-03-27 DIAGNOSIS — M79641 Pain in right hand: Secondary | ICD-10-CM | POA: Diagnosis not present

## 2020-04-04 ENCOUNTER — Other Ambulatory Visit: Payer: Self-pay | Admitting: Adult Health

## 2020-04-09 ENCOUNTER — Other Ambulatory Visit: Payer: Self-pay

## 2020-04-09 ENCOUNTER — Encounter: Payer: Self-pay | Admitting: Internal Medicine

## 2020-04-09 ENCOUNTER — Ambulatory Visit (INDEPENDENT_AMBULATORY_CARE_PROVIDER_SITE_OTHER): Payer: Medicare Other | Admitting: Internal Medicine

## 2020-04-09 VITALS — BP 130/80 | HR 77 | Temp 98.1°F | Wt 171.5 lb

## 2020-04-09 DIAGNOSIS — R935 Abnormal findings on diagnostic imaging of other abdominal regions, including retroperitoneum: Secondary | ICD-10-CM

## 2020-04-09 DIAGNOSIS — E1149 Type 2 diabetes mellitus with other diabetic neurological complication: Secondary | ICD-10-CM

## 2020-04-09 DIAGNOSIS — E1169 Type 2 diabetes mellitus with other specified complication: Secondary | ICD-10-CM

## 2020-04-09 DIAGNOSIS — I63012 Cerebral infarction due to thrombosis of left vertebral artery: Secondary | ICD-10-CM

## 2020-04-09 DIAGNOSIS — I6523 Occlusion and stenosis of bilateral carotid arteries: Secondary | ICD-10-CM

## 2020-04-09 DIAGNOSIS — I10 Essential (primary) hypertension: Secondary | ICD-10-CM

## 2020-04-09 DIAGNOSIS — E785 Hyperlipidemia, unspecified: Secondary | ICD-10-CM

## 2020-04-09 DIAGNOSIS — I2510 Atherosclerotic heart disease of native coronary artery without angina pectoris: Secondary | ICD-10-CM | POA: Diagnosis not present

## 2020-04-09 DIAGNOSIS — K7030 Alcoholic cirrhosis of liver without ascites: Secondary | ICD-10-CM | POA: Diagnosis not present

## 2020-04-09 DIAGNOSIS — I5032 Chronic diastolic (congestive) heart failure: Secondary | ICD-10-CM | POA: Diagnosis not present

## 2020-04-09 NOTE — Patient Instructions (Signed)
-  Nice seeing you today!!  -Schedule follow up in 3 months. 

## 2020-04-09 NOTE — Progress Notes (Signed)
Established Patient Office Visit     This visit occurred during the SARS-CoV-2 public health emergency.  Safety protocols were in place, including screening questions prior to the visit, additional usage of staff PPE, and extensive cleaning of exam room while observing appropriate contact time as indicated for disinfecting solutions.    CC/Reason for Visit: Follow-up chronic medical conditions  HPI: Deborah Wise is a 69 y.o. female who is coming in today for the above mentioned reasons. Past Medical History is significant for: Coronary artery disease followed by cardiology with a recent catheterization in October 2021 that showed a proximal RCA occlusion of 75%, distal RCA 50% and mid LAD 60%.  Medical management was recommended.  She has a history of coronary artery disease and follows on a yearly basis with vascular surgery, scheduled to see them again in January.  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  She has some mild thrombocytopenia due to splenomegaly from this.  She recently suffered a fall and fractured her fifth metacarpal saw Dr. Amedeo Plenty, a cast was placed, she tells me she is due to follow-up tomorrow to consider removal of cast.  She has had 2 Covid vaccines.  She is declining flu vaccine today despite counseling.   Past Medical/Surgical History: Past Medical History:  Diagnosis Date  . Anemia   . Arthritis   . Blood transfusion without reported diagnosis   . Carotid artery occlusion   . Cirrhosis ([place])   . Diabetes mellitus without complication ([place])   . Fibroid, uterine   . GERD (gastroesophageal reflux disease)   . Hepatitis    hep C  . Hypertension   .  Stroke ([place])   . Thrombocytopenia ([place]) 12/25/2014  . Urinary incontinence    Patient noticed mild and is not currently a significant problem    Past Surgical History:  Procedure Laterality Date  . ANKLE FRACTURE SURGERY Right 1999   x 2, rod  . COLONOSCOPY    . IR GENERIC HISTORICAL  06/30/2016   IR ANGIO VERTEBRAL SEL SUBCLAVIAN INNOMINATE UNI R MOD SED 06/30/2016 [name], MD [facility]  . IR GENERIC HISTORICAL  06/30/2016   IR ANGIO VERTEBRAL SEL VERTEBRAL UNI L MOD SED 06/30/2016 [name], MD [facility]  . IR GENERIC HISTORICAL  06/30/2016   IR ANGIO INTRA EXTRACRAN SEL COM CAROTID INNOMINATE BILAT MOD SED 06/30/2016 [name], MD [facility]  . LEFT HEART CATH AND CORONARY ANGIOGRAPHY N/A 03/25/2020   Procedure: LEFT HEART CATH AND CORONARY ANGIOGRAPHY;  Surgeon: Jettie Booze, MD;  Location: [facility];  Service: Cardiovascular;  Laterality: N/A;  . MULTIPLE EXTRACTIONS WITH ALVEOLOPLASTY  10/25/2011   Procedure: MULTIPLE EXTRACION WITH ALVEOLOPLASTY;  Surgeon: Gae Bon, DDS;  Location: [facility];  Service: Oral Surgery;  Laterality: Bilateral;  Extract teeth numbers one, two, six, seven, eight, nine, ten, eleven, twelve, fifteen, sixteen, eighteen, nineteen, twenty-three, twenty-four, twenty-five, twenty-seven, thirty-two and alveoplasty.    Social History:  reports that she has never smoked. She has never used smokeless tobacco. She reports current alcohol use of about 1.0 standard drink of alcohol per week. She reports previous drug use. Frequency: 1.00 time  per week. Drug: Cocaine.  Allergies: No Known Allergies  Family History:  Family History  Problem Relation Age of Onset  . Diabetes type II Mother   . Hypertension Mother   . Stroke Mother   . Cancer Father        patient does not know what type of cancer  . Diabetes Daughter   . Breast cancer Neg Hx   . Colon polyps Neg Hx   . Crohn's disease Neg Hx   . Esophageal  cancer Neg Hx   . Rectal cancer Neg Hx   . Stomach cancer Neg Hx   . Colon cancer Neg Hx      Current Outpatient Medications:  .  Accu-Chek FastClix Lancets MISC, 1 each by Does not apply route daily., Disp: 100 each, Rfl: 3 .  Accu-Chek Softclix Lancets lancets, USE AS DIRECTED TO CHECK BLOOD SUGAR TWICE A DAY, Disp: 100 each, Rfl: 0 .  amLODipine (NORVASC) 10 MG tablet, Take 1 tablet (10 mg total) by mouth daily., Disp: 90 tablet, Rfl: 1 .  ASPIRIN LOW DOSE 81 MG EC tablet, TAKE 1 TABLET BY MOUTH EVERY DAY (Patient taking differently: Take 81 mg by mouth daily. ), Disp: 90 tablet, Rfl: 1 .  Blood Glucose Monitoring Suppl (ACCU-CHEK GUIDE) w/Device KIT, Please specify directions, refills and quantity, Disp: 1 kit, Rfl: 0 .  chlorthalidone (HYGROTON) 25 MG tablet, Take 25 mg by mouth daily., Disp: , Rfl:  .  Cholecalciferol 25 MCG (1000 UT) capsule, Take 1,000 Units by mouth daily. , Disp: , Rfl:  .  cyanocobalamin 1000 MCG tablet, Take 1,000 mcg by mouth daily. , Disp: , Rfl:  .  glucose blood (ACCU-CHEK GUIDE) test strip, 1 each by Other route in the morning and at bedtime. Dx E11.9, Disp: 100 strip, Rfl: 0 .  hydrALAZINE (APRESOLINE) 25 MG tablet, Take 25 mg by mouth daily., Disp: , Rfl:  .  hydrochlorothiazide (HYDRODIURIL) 25 MG tablet, Take 1 tablet (25 mg total) by mouth daily., Disp: 90 tablet, Rfl: 1 .  lisinopril (ZESTRIL) 20 MG tablet, TAKE ONE TABLET BY MOUTH ONCE DAILY FOR blood pressure/heart (Patient taking differently: Take 20 mg by mouth daily. ), Disp: 90 tablet, Rfl: 0 .  meloxicam (MOBIC) 15 MG tablet, Take 1 tablet (15 mg total) by mouth daily. (Patient taking differently: Take 7.5 mg by mouth daily as needed for pain. ), Disp: 30 tablet, Rfl: 0 .  metFORMIN (GLUCOPHAGE) 500 MG tablet, Take 1 tablet (500 mg total) by mouth daily with breakfast., Disp: , Rfl:  .  REPATHA SURECLICK 323 MG/ML SOAJ, INJECT 161m into THE SKIN every 14 DAYS, Disp: 2 mL, Rfl: 1 .  triamcinolone  cream (KENALOG) 0.1 %, APPLY TO AFFECTED AREA TWICE A DAY (Patient taking differently: Apply 1 application topically daily as needed (Rash). ), Disp: 30 g, Rfl: 2 .  metoprolol tartrate (LOPRESSOR) 50 MG tablet, Take 1 tablet (50 mg total) by mouth once for 1 dose. Take one tablet 2 hours before your Coronary CT (Patient not taking: Reported on 03/19/2020), Disp: 1 tablet, Rfl: 0  Current Facility-Administered Medications:  .  sodium chloride flush (NS) 0.9 % injection 3 mL, 3 mL, Intravenous, Q12H, Skains, Mark C, MD  Review of Systems:  Constitutional: Denies fever, chills, diaphoresis, appetite change and fatigue.  HEENT: Denies photophobia, eye pain, redness, hearing loss, ear pain, congestion, sore throat, rhinorrhea, sneezing, mouth sores, trouble swallowing, neck pain, neck stiffness and tinnitus.   Respiratory: Denies SOB,  DOE, cough, chest tightness,  and wheezing.   Cardiovascular: Denies chest pain, palpitations and leg swelling.  Gastrointestinal: Denies nausea, vomiting, abdominal pain, diarrhea, constipation, blood in stool and abdominal distention.  Genitourinary: Denies dysuria, urgency, frequency, hematuria, flank pain and difficulty urinating.  Endocrine: Denies: hot or cold intolerance, sweats, changes in hair or nails, polyuria, polydipsia. Musculoskeletal: Denies myalgias, back pain, joint swelling, arthralgias and gait problem.  Skin: Denies pallor, rash and wound.  Neurological: Denies dizziness, seizures, syncope, weakness, light-headedness, numbness and headaches.  Hematological: Denies adenopathy. Easy bruising, personal or family bleeding history  Psychiatric/Behavioral: Denies suicidal ideation, mood changes, confusion, nervousness, sleep disturbance and agitation    Physical Exam: Vitals:   04/09/20 0908  BP: 130/80  Pulse: 77  Temp: 98.1 F (36.7 C)  TempSrc: Oral  SpO2: 98%  Weight: 171 lb 8 oz (77.8 kg)    Body mass index is 28.54  kg/m.   Constitutional: NAD, calm, comfortable Eyes: PERRL, lids and conjunctivae normal ENMT: Mucous membranes are moist.  Respiratory: clear to auscultation bilaterally, no wheezing, no crackles. Normal respiratory effort. No accessory muscle use.  Cardiovascular: Regular rate and rhythm, no murmurs / rubs / gallops. No extremity edema. Neurologic: Grossly intact and nonfocal Psychiatric: Normal judgment and insight. Alert and oriented x 3. Normal mood.    Impression and Plan:  Coronary artery disease involving native coronary artery of native heart without angina pectoris Chronic diastolic CHF (congestive heart failure) ([place]) -Most recent catheterization as above, followed closely by cardiology, plans for medical management.  Alcoholic cirrhosis of liver without ascites ([place]) -She no longer drinks.  She follows with GI.  Hyperlipidemia associated with type 2 diabetes mellitus ([place]) -Much better improved with an LDL of 73 in September, now on a PSK 9 inhibitor.  Bilateral carotid artery stenosis -79% on the right and 59% on the left, has follow-up with Dr. Trula Slade in January.  Type 2 diabetes mellitus with other neurologic complication, without long-term current use of insulin ([place]) -Well-controlled with an A1c of 5.5 in September.  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   Patient Instructions  -Nice seeing you today!!  -Schedule follow up in 3 months.     [name], MD [facility] at [facility]

## 2020-04-10 DIAGNOSIS — S62306A Unspecified fracture of fifth metacarpal bone, right hand, initial encounter for closed fracture: Secondary | ICD-10-CM | POA: Diagnosis not present

## 2020-04-11 ENCOUNTER — Telehealth: Payer: Self-pay

## 2020-04-11 ENCOUNTER — Telehealth: Payer: Self-pay | Admitting: Podiatry

## 2020-04-11 DIAGNOSIS — I10 Essential (primary) hypertension: Secondary | ICD-10-CM

## 2020-04-11 DIAGNOSIS — E1149 Type 2 diabetes mellitus with other diabetic neurological complication: Secondary | ICD-10-CM

## 2020-04-11 MED ORDER — MELOXICAM 15 MG PO TABS
15.0000 mg | ORAL_TABLET | Freq: Every day | ORAL | 0 refills | Status: DC
Start: 2020-04-11 — End: 2020-10-20

## 2020-04-11 NOTE — Addendum Note (Signed)
Addended bySherryle Lis, [place] R on: 04/11/2020 05:40 PM   Modules accepted: Orders

## 2020-04-11 NOTE — Telephone Encounter (Signed)
Needs a meloxicam prescription refill.

## 2020-04-11 NOTE — Progress Notes (Signed)
Chronic Care Management Pharmacy Assistant   Name: Deborah Wise  MRN: [id number] DOB: [birth year]  Reason for Encounter: Medication Review  PCP : Isaac Bliss, Rayford Halsted, MD  Allergies:  No Known Allergies  Medications: Outpatient Encounter Medications as of 04/11/2020  Medication Sig  . Accu-Chek FastClix Lancets MISC 1 each by Does not apply route daily.  . Accu-Chek Softclix Lancets lancets USE AS DIRECTED TO CHECK BLOOD SUGAR TWICE A DAY  . amLODipine (NORVASC) 10 MG tablet Take 1 tablet (10 mg total) by mouth daily.  . ASPIRIN LOW DOSE 81 MG EC tablet TAKE 1 TABLET BY MOUTH EVERY DAY (Patient taking differently: Take 81 mg by mouth daily. )  . Blood Glucose Monitoring Suppl (ACCU-CHEK GUIDE) w/Device KIT Please specify directions, refills and quantity  . chlorthalidone (HYGROTON) 25 MG tablet Take 25 mg by mouth daily.  . Cholecalciferol 25 MCG (1000 UT) capsule Take 1,000 Units by mouth daily.   . cyanocobalamin 1000 MCG tablet Take 1,000 mcg by mouth daily.   [name] Kitchen glucose blood (ACCU-CHEK GUIDE) test strip 1 each by Other route in the morning and at bedtime. Dx E11.9  . hydrALAZINE (APRESOLINE) 25 MG tablet Take 25 mg by mouth daily.  . hydrochlorothiazide (HYDRODIURIL) 25 MG tablet Take 1 tablet (25 mg total) by mouth daily.  [name] Kitchen lisinopril (ZESTRIL) 20 MG tablet TAKE ONE TABLET BY MOUTH ONCE DAILY FOR blood pressure/heart (Patient taking differently: Take 20 mg by mouth daily. )  . meloxicam (MOBIC) 15 MG tablet Take 1 tablet (15 mg total) by mouth daily. (Patient taking differently: Take 7.5 mg by mouth daily as needed for pain. )  . metFORMIN (GLUCOPHAGE) 500 MG tablet Take 1 tablet (500 mg total) by mouth daily with breakfast.  . metoprolol tartrate (LOPRESSOR) 50 MG tablet Take 1 tablet (50 mg total) by mouth once for 1 dose. Take one tablet 2 hours before your Coronary CT (Patient not taking: Reported on 03/19/2020)  . REPATHA SURECLICK 884 MG/ML SOAJ INJECT 161m  into THE SKIN every 14 DAYS  . triamcinolone cream (KENALOG) 0.1 % APPLY TO AFFECTED AREA TWICE A DAY (Patient taking differently: Apply 1 application topically daily as needed (Rash). )   Facility-Administered Encounter Medications as of 04/11/2020  Medication  . sodium chloride flush (NS) 0.9 % injection 3 mL    Current Diagnosis: Patient Active Problem List   Diagnosis Date Noted  . Coronary artery disease   . Chronic diastolic CHF (congestive heart failure) ([place] 03/12/2020  . Dysuria 03/01/2020  . Chronic arthropathy 11/07/2019  . Hallux limitus of right foot 11/07/2019  . Diabetic neuropathy ([place] 04/04/2019  . Cirrhosis of liver ([place] 06/22/2018  . Chronic hepatitis ([place] 06/22/2018  . Hyperlipidemia associated with type 2 diabetes mellitus ([place] 06/22/2018  . Carotid artery disease ([place] 06/22/2018  . 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[place] 07/02/2016  . Aneurysm of middle cerebral artery   . Cerebrovascular accident (CVA) due to thrombosis of left vertebral artery ([place] 06/28/2016  . Cocaine abuse ([place]   . Stroke (cerebrum) ([place] 06/27/2016  . Microcytic anemia 12/27/2014  . Thrombocytopenia ([place] 12/25/2014  . Hypoglycemia 07/21/2013  . Diabetes mellitus ([place] 07/21/2013  . 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 Liver disease 07/21/2013  . Alcohol abuse 07/21/2013   Follow-Up:  Pharmacist Review   Reviewed chart for medication changes ahead of medication coordination call.  No OVs, Consults, or hospital visits since last care coordination call/Pharmacist visit.  03/18/20 Cardiology   03/25/20 Cardiology  04/09/20 PCP No medication changes indicated   BP Readings from Last 3 Encounters:  04/09/20 130/80  03/25/20 (!) 163/60  03/18/20 140/80    Lab Results  Component Value Date   HGBA1C 5.5 02/20/2020     Patient obtains medications through Adherence Packaging  30 Days   Last adherence delivery included: (medication name and frequency)   ACCU-Check  Softclix Lancets             ACCU- Check Guide test strip Amlodipine 10 mg Tablet Daily- Breakfast Aspirin 81 mg Tablet Daily- Breakfast  Vitamin D3 MCG 1000-unit capsule Daily- Breakfast Vitamin B-12 Daily 1000 MCG Tablet - Breakfast Repatha Sureclick 990 MG/ML Injection- Every 14 Days Hydrochlorothiazide 25 mg Tablet Daily- Breakfast Lisinopril 20 mg Tablet Daily-Breakfast Meloxicam 15 mg Daily Tablet- PRN -Vials Metformin 500 mg Daily- Breakfast Triamcinolone cream -PRN             Cephalexin 500 MG Capsule - Vials Patient declined (meds) last month due to PRN use/additional supply on hand.  None ID.   Patient is due for next adherence delivery on: 04/18/2020. Called patient and reviewed medications and coordinated delivery.  This delivery to include:   ACCU- Check Guide test strip Amlodipine 10 mg Tablet Daily- Breakfast Aspirin 81 mg Tablet Daily- Breakfast  Vitamin D3 MCG 1000-unit capsule Daily- Breakfast Vitamin B-12 Daily 1000 MCG Tablet - Breakfast Repatha Sureclick 689 MG/ML Injection- Every 14 Days Hydrochlorothiazide 25 mg Tablet Daily- Breakfast Lisinopril 20 mg Tablet Daily-Breakfast Meloxicam 15 mg Daily Tablet- PRN -Vials Metformin 500 mg Daily- Breakfast Triamcinolone cream -PRN  Patient declined the following medications (meds) due to (reason)   Cephalexin 500 MG Capsule - Not taking   ACCU-Check Softclix Lancets(adequate supply)   Hydralazine 25 MG Tablet- Not taking  Metoprolol 50 MG Tablet - Not taking   Patient needs refills for Meloxicam 15 mg, ACCU- Check Guide test strip .  Confirmed delivery date of 04/18/2020, advised patient that pharmacy will contact them the morning of delivery.  [place]  Pharmacist Assistant [phone]

## 2020-04-15 ENCOUNTER — Encounter: Payer: Self-pay | Admitting: Cardiovascular Disease

## 2020-04-15 ENCOUNTER — Ambulatory Visit (INDEPENDENT_AMBULATORY_CARE_PROVIDER_SITE_OTHER): Payer: Medicare Other | Admitting: Cardiovascular Disease

## 2020-04-15 ENCOUNTER — Telehealth: Payer: Self-pay | Admitting: Internal Medicine

## 2020-04-15 ENCOUNTER — Other Ambulatory Visit: Payer: Self-pay

## 2020-04-15 DIAGNOSIS — I739 Peripheral vascular disease, unspecified: Secondary | ICD-10-CM

## 2020-04-15 NOTE — Assessment & Plan Note (Signed)
Deborah Wise was referred to me by Dr. Marlou Porch for evaluation of symptomatic PAD. She does have CAD by recent cath performed by Dr. Irish Lack and multiple cardiac risk factors. She has bilateral claudication which is lifestyle limiting and symmetric. Recent Doppler studies performed 03/19/2020 revealed a right ABI of 0.52 and a left of 0.72. She had monophasic signals in her right external iliac artery. The common iliac could not be seen because of a large fibroid uterus. She also had a moderately elevated signal in her left popliteal artery. Because she has already been established with Dr. Trula Slade for evaluation of her carotid stenosis I am going to refer her back to him for evaluation of claudication. She may need an alternative imaging strategy Such as CTA to further evaluate her aortoiliac system.

## 2020-04-15 NOTE — Progress Notes (Signed)
04/15/2020 Deborah Wise   02/13/1951  [id number]  Primary Physician Isaac Bliss, Rayford Halsted, MD Primary Cardiologist: Lorretta Harp MD FACP, [place], [place], [place]  HPI:  Deborah Wise is a 69 y.o. mild to moderately overweight recently married African-American female mother of 3 grown children, grandmother to 40 grandchildren referred to me by Dr. Marlou Porch for peripheral vascular evaluation. She is currently disabled. Her risk factors include treated hypertension, diabetes and hyperlipidemia. She is never had a heart attack but apparently had a stroke several years ago. She has been evaluated by Dr. Trula Slade last seen 04/25/2017 because of carotid artery disease. Apparently carotid duplex suggested high-grade right ICA stenosis but subsequent cerebral angiogram revealed only 60 to 65% right ICA stenosis. Was elected to treat her medically. She did undergo coronary angiography last month by Dr. Irish Lack revealing moderate calcification in the proximal LAD and RCA . Dr. Irish Lack did not feel that they require intervention at this time. She does complain of bilateral lifestyle limiting claudication. She had recent Doppler studies performed 03/19/2020 revealing a right ABI of 0.52 and a left of 0.72. She had monophasic signals in her right external iliac artery. Common iliac artery could not be visualized because of a large fibroid uterus overlying this. Her left side had a moderate stenosis in her left popliteal artery. Because her prior relationship with Dr. Trula Slade I am referring her back to him for further evaluation.   Current Meds  Medication Sig  . Accu-Chek FastClix Lancets MISC 1 each by Does not apply route daily.  . Accu-Chek Softclix Lancets lancets USE AS DIRECTED TO CHECK BLOOD SUGAR TWICE A DAY  . amLODipine (NORVASC) 10 MG tablet Take 1 tablet (10 mg total) by mouth daily.  . ASPIRIN LOW DOSE 81 MG EC tablet TAKE 1 TABLET BY MOUTH EVERY DAY (Patient taking differently: Take  81 mg by mouth daily. )  . Blood Glucose Monitoring Suppl (ACCU-CHEK GUIDE) w/Device KIT Please specify directions, refills and quantity  . chlorthalidone (HYGROTON) 25 MG tablet Take 25 mg by mouth daily.  . Cholecalciferol 25 MCG (1000 UT) capsule Take 1,000 Units by mouth daily.   . cyanocobalamin 1000 MCG tablet Take 1,000 mcg by mouth daily.   [name] Kitchen glucose blood (ACCU-CHEK GUIDE) test strip 1 each by Other route in the morning and at bedtime. Dx E11.9  . hydrALAZINE (APRESOLINE) 25 MG tablet Take 25 mg by mouth daily.  . hydrochlorothiazide (HYDRODIURIL) 25 MG tablet Take 1 tablet (25 mg total) by mouth daily.  [name] Kitchen lisinopril (ZESTRIL) 20 MG tablet TAKE ONE TABLET BY MOUTH ONCE DAILY FOR blood pressure/heart (Patient taking differently: Take 20 mg by mouth daily. )  . meloxicam (MOBIC) 15 MG tablet Take 1 tablet (15 mg total) by mouth daily. (Patient taking differently: Take 15 mg by mouth as needed. )  . metFORMIN (GLUCOPHAGE) 500 MG tablet Take 1 tablet (500 mg total) by mouth daily with breakfast.  . REPATHA SURECLICK 833 MG/ML SOAJ INJECT 167m into THE SKIN every 14 DAYS  . triamcinolone cream (KENALOG) 0.1 % APPLY TO AFFECTED AREA TWICE A DAY (Patient taking differently: Apply 1 application topically daily as needed (Rash). )   Current Facility-Administered Medications for the 04/15/20 encounter (Office Visit) with BLorretta Harp MD  Medication  . sodium chloride flush (NS) 0.9 % injection 3 mL     No Known Allergies  Social History   Socioeconomic History  . Marital status: Legally Separated  Spouse name: Not on file  . Number of children: 3  . Years of education: Not on file  . Highest education level: Not on file  Occupational History  . Occupation: disabled  Tobacco Use  . Smoking status: Never Smoker  . Smokeless tobacco: Never Used  Vaping Use  . Vaping Use: Never used  Substance and Sexual Activity  . Alcohol use: Yes    Alcohol/week: 1.0 standard drink     Types: 1 Cans of beer per week    Comment: 1/2 bottle of wine and some beer a day  . Drug use: Not Currently    Frequency: 1.0 times per week    Types: Cocaine    Comment: last use cocaine last year 2018  . Sexual activity: Not Currently  Other Topics Concern  . Not on file  Social History Narrative  . Not on file   Social Determinants of Health   Financial Resource Strain: Low Risk   . Difficulty of Paying Living Expenses: Not hard at all  Food Insecurity:   . Worried About Charity fundraiser in the Last Year: Not on file  . Ran Out of Food in the Last Year: Not on file  Transportation Needs: No Transportation Needs  . Lack of Transportation (Medical): No  . Lack of Transportation (Non-Medical): No  Physical Activity:   . Days of Exercise per Week: Not on file  . Minutes of Exercise per Session: Not on file  Stress:   . Feeling of Stress : Not on file  Social Connections:   . Frequency of Communication with Friends and Family: Not on file  . Frequency of Social Gatherings with Friends and Family: Not on file  . Attends Religious Services: Not on file  . Active Member of Clubs or Organizations: Not on file  . Attends Archivist Meetings: Not on file  . Marital Status: Not on file  Intimate Partner Violence:   . Fear of Current or Ex-Partner: Not on file  . Emotionally Abused: Not on file  . Physically Abused: Not on file  . Sexually Abused: Not on file     Review of Systems: General: negative for chills, fever, night sweats or weight changes.  Cardiovascular: negative for chest pain, dyspnea on exertion, edema, orthopnea, palpitations, paroxysmal nocturnal dyspnea or shortness of breath Dermatological: negative for rash Respiratory: negative for cough or wheezing Urologic: negative for hematuria Abdominal: negative for nausea, vomiting, diarrhea, bright red blood per rectum, melena, or hematemesis Neurologic: negative for visual changes, syncope, or  dizziness All other systems reviewed and are otherwise negative except as noted above.    Blood pressure 140/68, pulse 73, height 5' 5"  (1.651 m), weight 170 lb (77.1 kg).  General appearance: alert and no distress Neck: no adenopathy, no JVD, supple, symmetrical, trachea midline, thyroid not enlarged, symmetric, no tenderness/mass/nodules and Right carotid bruit Lungs: clear to auscultation bilaterally Heart: regular rate and rhythm, S1, S2 normal, no murmur, click, rub or gallop Extremities: extremities normal, atraumatic, no cyanosis or edema Pulses: 2+ and symmetric Skin: Skin color, texture, turgor normal. No rashes or lesions Neurologic: Alert and oriented X 3, normal strength and tone. Normal symmetric reflexes. Normal coordination and gait  EKG not performed today  ASSESSMENT AND PLAN:   Peripheral arterial disease ([place]) Ms. Neace was referred to me by Dr. Marlou Porch for evaluation of symptomatic PAD. She does have CAD by recent cath performed by Dr. Irish Lack and multiple cardiac risk factors. She has  bilateral claudication which is lifestyle limiting and symmetric. Recent Doppler studies performed 03/19/2020 revealed a right ABI of 0.52 and a left of 0.72. She had monophasic signals in her right external iliac artery. The common iliac could not be seen because of a large fibroid uterus. She also had a moderately elevated signal in her left popliteal artery. Because she has already been established with Dr. Trula Slade for evaluation of her carotid stenosis I am going to refer her back to him for evaluation of claudication. She may need an alternative imaging strategy Such as CTA to further evaluate her aortoiliac system.      Lorretta Harp MD FACP,FACC,FAHA, [facility] 04/15/2020 8:33 AM

## 2020-04-15 NOTE — Telephone Encounter (Addendum)
Gwinda Passe is calling and requesting a refill for [facility] strips [facility].  CB is [phone]  [facility] - [place], [place] - [address]  [address], [place] [address]  Phone:  [phone] Fax:  [phone]

## 2020-04-15 NOTE — Patient Instructions (Signed)
Medication Instructions:  Your physician recommends that you continue on your current medications as directed. Please refer to the Current Medication list given to you today.  *If you need a refill on your cardiac medications before your next appointment, please call your pharmacy*  Testing/Procedures: Your physician has requested that you have an abdominal aorta duplex. During this test, an ultrasound is used to evaluate the aorta. Allow 30 minutes for this exam. Do not eat after midnight the day before and avoid carbonated beverages. [place]. 2nd Floor   Follow-Up: At [facility], you and your health needs are our priority.  As part of our continuing mission to provide you with exceptional heart care, we have created designated Provider Care Teams.  These Care Teams include your primary Cardiologist (physician) and Advanced Practice Providers (APPs -  Physician Assistants and Nurse Practitioners) who all work together to provide you with the care you need, when you need it.  We recommend signing up for the patient portal called "MyChart".  Sign up information is provided on this After Visit Summary.  MyChart is used to connect with patients for Virtual Visits (Telemedicine).  Patients are able to view lab/test results, encounter notes, upcoming appointments, etc.  Non-urgent messages can be sent to your provider as well.   To learn more about what you can do with MyChart, go to [email or web address].    Your next appointment:   You may see Dr. Gwenlyn Found as needed.  If you need an appointment please call our office.

## 2020-04-16 MED ORDER — ACCU-CHEK GUIDE VI STRP: 1.0000 | ORAL_STRIP | Freq: Two times a day (BID) | 0 refills | Status: DC

## 2020-04-16 NOTE — Telephone Encounter (Signed)
Refill sent.

## 2020-04-17 DIAGNOSIS — M79641 Pain in right hand: Secondary | ICD-10-CM | POA: Diagnosis not present

## 2020-04-17 DIAGNOSIS — S62306D Unspecified fracture of fifth metacarpal bone, right hand, subsequent encounter for fracture with routine healing: Secondary | ICD-10-CM | POA: Diagnosis not present

## 2020-04-22 ENCOUNTER — Other Ambulatory Visit: Payer: Self-pay

## 2020-04-22 ENCOUNTER — Encounter: Payer: Self-pay | Admitting: Family Medicine

## 2020-04-22 ENCOUNTER — Ambulatory Visit (INDEPENDENT_AMBULATORY_CARE_PROVIDER_SITE_OTHER): Payer: Medicare Other | Admitting: Family Medicine

## 2020-04-22 VITALS — BP 181/81 | HR 78 | Ht 65.0 in | Wt 169.0 lb

## 2020-04-22 DIAGNOSIS — R300 Dysuria: Secondary | ICD-10-CM

## 2020-04-22 LAB — POCT URINALYSIS DIPSTICK
Bilirubin, UA: NEGATIVE
Blood, UA: NEGATIVE
Glucose, UA: NEGATIVE
Ketones, UA: NEGATIVE
Nitrite, UA: NEGATIVE
Protein, UA: NEGATIVE
Spec Grav, UA: 1.01 (ref 1.010–1.025)
Urobilinogen, UA: 0.2 E.U./dL
pH, UA: 8 (ref 5.0–8.0)

## 2020-04-22 MED ORDER — CEPHALEXIN 500 MG PO CAPS: 500.0000 mg | ORAL_CAPSULE | Freq: Three times a day (TID) | ORAL | 0 refills | Status: DC

## 2020-04-22 NOTE — Progress Notes (Signed)
Established Patient Office Visit  Subjective:  Patient ID: Deborah Wise, female    DOB: 1951-03-12  Age: 69 y.o. MRN: [id number]  CC:  Chief Complaint  Patient presents with  . Dysuria    HPI Joanette L Hornung presents for dysuria intermittently past month. She has occasional burning with urination but not consistently. No vaginal discharge. No fevers or chills. No nausea or vomiting. No flank pain. She states she has had some UTIs in the past. She denies any known drug allergies. No recent instrumentation.  She has hypertension and is on multiple medications but states she did not take any of her medicines this morning. She has otherwise been compliant.  Past Medical History:  Diagnosis Date  . Anemia   . Arthritis   . Blood transfusion without reported diagnosis   . Carotid artery occlusion   . Cirrhosis ([place])   . Diabetes mellitus without complication ([place])   . Fibroid, uterine   . GERD (gastroesophageal reflux disease)   . Hepatitis    hep C  . Hypertension   . Stroke ([place])   . Thrombocytopenia ([place]) 12/25/2014  . Urinary incontinence    Patient noticed mild and is not currently a significant problem    Past Surgical History:  Procedure Laterality Date  . ANKLE FRACTURE SURGERY Right 1999   x 2, rod  . COLONOSCOPY    . IR GENERIC HISTORICAL  06/30/2016   IR ANGIO VERTEBRAL SEL SUBCLAVIAN INNOMINATE UNI R MOD SED 06/30/2016 [name], MD [facility]  . IR GENERIC HISTORICAL  06/30/2016   IR ANGIO VERTEBRAL SEL VERTEBRAL UNI L MOD SED 06/30/2016 [name], MD [facility]  . IR GENERIC HISTORICAL  06/30/2016   IR ANGIO INTRA EXTRACRAN SEL COM CAROTID INNOMINATE BILAT MOD SED 06/30/2016 [name], MD [facility]  . LEFT HEART CATH AND CORONARY ANGIOGRAPHY N/A 03/25/2020   Procedure: LEFT HEART CATH AND CORONARY ANGIOGRAPHY;  Surgeon: Jettie Booze, MD;  Location: [place] [facility];  Service: Cardiovascular;  Laterality: N/A;  .  MULTIPLE EXTRACTIONS WITH ALVEOLOPLASTY  10/25/2011   Procedure: MULTIPLE EXTRACION WITH ALVEOLOPLASTY;  Surgeon: Gae Bon, DDS;  Location: [place];  Service: Oral Surgery;  Laterality: Bilateral;  Extract teeth numbers one, two, six, seven, eight, nine, ten, eleven, twelve, fifteen, sixteen, eighteen, nineteen, twenty-three, twenty-four, twenty-five, twenty-seven, thirty-two and alveoplasty.    Family History  Problem Relation Age of Onset  . Diabetes type II Mother   . Hypertension Mother   . Stroke Mother   . Cancer Father        patient does not know what type of cancer  . Diabetes Daughter   . Breast cancer Neg Hx   . Colon polyps Neg Hx   . Crohn's disease Neg Hx   . Esophageal cancer Neg Hx   . Rectal cancer Neg Hx   . Stomach cancer Neg Hx   . Colon cancer Neg Hx     Social History   Socioeconomic History  . Marital status: Legally Separated    Spouse name: Not on file  . Number of children: 3  . Years of education: Not on file  . Highest education level: Not on file  Occupational History  . Occupation: disabled  Tobacco Use  . Smoking status: Never Smoker  . Smokeless tobacco: Never Used  Vaping Use  . Vaping Use: Never used  Substance and Sexual Activity  . Alcohol use: Yes    Alcohol/week: 1.0 standard  drink    Types: 1 Cans of beer per week    Comment: 1/2 bottle of wine and some beer a day  . Drug use: Not Currently    Frequency: 1.0 times per week    Types: Cocaine    Comment: last use cocaine last year 2018  . Sexual activity: Not Currently  Other Topics Concern  . Not on file  Social History Narrative  . Not on file   Social Determinants of Health   Financial Resource Strain: Low Risk   . Difficulty of Paying Living Expenses: Not hard at all  Food Insecurity:   . Worried About Charity fundraiser in the Last Year: Not on file  . Ran Out of Food in the Last Year: Not on file  Transportation Needs: No Transportation Needs  . Lack of  Transportation (Medical): No  . Lack of Transportation (Non-Medical): No  Physical Activity:   . Days of Exercise per Week: Not on file  . Minutes of Exercise per Session: Not on file  Stress:   . Feeling of Stress : Not on file  Social Connections:   . Frequency of Communication with Friends and Family: Not on file  . Frequency of Social Gatherings with Friends and Family: Not on file  . Attends Religious Services: Not on file  . Active Member of Clubs or Organizations: Not on file  . Attends Archivist Meetings: Not on file  . Marital Status: Not on file  Intimate Partner Violence:   . Fear of Current or Ex-Partner: Not on file  . Emotionally Abused: Not on file  . Physically Abused: Not on file  . Sexually Abused: Not on file    Outpatient Medications Prior to Visit  Medication Sig Dispense Refill  . Accu-Chek FastClix Lancets MISC 1 each by Does not apply route daily. 100 each 3  . Accu-Chek Softclix Lancets lancets USE AS DIRECTED TO CHECK BLOOD SUGAR TWICE A DAY 100 each 0  . amLODipine (NORVASC) 10 MG tablet Take 1 tablet (10 mg total) by mouth daily. 90 tablet 1  . ASPIRIN LOW DOSE 81 MG EC tablet TAKE 1 TABLET BY MOUTH EVERY DAY (Patient taking differently: Take 81 mg by mouth daily. ) 90 tablet 1  . Blood Glucose Monitoring Suppl (ACCU-CHEK GUIDE) w/Device KIT Please specify directions, refills and quantity 1 kit 0  . chlorthalidone (HYGROTON) 25 MG tablet Take 25 mg by mouth daily.    . Cholecalciferol 25 MCG (1000 UT) capsule Take 1,000 Units by mouth daily.     . cyanocobalamin 1000 MCG tablet Take 1,000 mcg by mouth daily.     [name] Kitchen glucose blood (ACCU-CHEK GUIDE) test strip 1 each by Other route in the morning and at bedtime. Dx E11.9 100 strip 0  . hydrALAZINE (APRESOLINE) 25 MG tablet Take 25 mg by mouth daily.    . hydrochlorothiazide (HYDRODIURIL) 25 MG tablet Take 1 tablet (25 mg total) by mouth daily. 90 tablet 1  . lisinopril (ZESTRIL) 20 MG tablet TAKE  ONE TABLET BY MOUTH ONCE DAILY FOR blood pressure/heart (Patient taking differently: Take 20 mg by mouth daily. ) 90 tablet 0  . meloxicam (MOBIC) 15 MG tablet Take 1 tablet (15 mg total) by mouth daily. (Patient taking differently: Take 15 mg by mouth as needed. ) 30 tablet 0  . metFORMIN (GLUCOPHAGE) 500 MG tablet Take 1 tablet (500 mg total) by mouth daily with breakfast.    . REPATHA SURECLICK 546 MG/ML  SOAJ INJECT 179m into THE SKIN every 14 DAYS 2 mL 1  . triamcinolone cream (KENALOG) 0.1 % APPLY TO AFFECTED AREA TWICE A DAY (Patient taking differently: Apply 1 application topically daily as needed (Rash). ) 30 g 2   Facility-Administered Medications Prior to Visit  Medication Dose Route Frequency Provider Last Rate Last Admin  . sodium chloride flush (NS) 0.9 % injection 3 mL  3 mL Intravenous Q12H Skains, Mark C, MD        No Known Allergies  ROS Review of Systems  Constitutional: Negative for fatigue and unexpected weight change.  Eyes: Negative for visual disturbance.  Respiratory: Negative for cough, chest tightness, shortness of breath and wheezing.   Cardiovascular: Negative for chest pain, palpitations and leg swelling.  Gastrointestinal: Negative for nausea and vomiting.  Endocrine: Negative for polydipsia and polyuria.  Genitourinary: Positive for dysuria. Negative for flank pain and hematuria.  Neurological: Negative for dizziness, seizures, syncope, weakness, light-headedness and headaches.  Psychiatric/Behavioral: Negative for confusion.      Objective:    Physical Exam Vitals reviewed.  Constitutional:      Appearance: Normal appearance.  Cardiovascular:     Rate and Rhythm: Normal rate and regular rhythm.  Pulmonary:     Effort: Pulmonary effort is normal.     Breath sounds: Normal breath sounds.  Neurological:     Mental Status: She is alert.     BP (!) 181/81 Comment: no medication  Pulse 78   Ht 5' 5"  (1.651 m)   Wt 169 lb (76.7 kg)   BMI 28.12  kg/m  Wt Readings from Last 3 Encounters:  04/22/20 169 lb (76.7 kg)  04/15/20 170 lb (77.1 kg)  04/09/20 171 lb 8 oz (77.8 kg)     Health Maintenance Due  Topic Date Due  . OPHTHALMOLOGY EXAM  Never done  . DEXA SCAN  Never done  . FOOT EXAM  04/03/2020    There are no preventive care reminders to display for this patient.  Lab Results  Component Value Date   TSH 3.314 06/28/2016   Lab Results  Component Value Date   WBC 6.2 03/18/2020   HGB 12.1 03/18/2020   HCT 37.4 03/18/2020   MCV 76 (L) 03/18/2020   PLT 137 (L) 03/18/2020   Lab Results  Component Value Date   NA 143 03/18/2020   K 3.7 03/18/2020   CHLORIDE 106 12/31/2014   CO2 23 03/18/2020   GLUCOSE 87 03/18/2020   BUN 13 03/18/2020   CREATININE 0.83 03/18/2020   BILITOT 1.1 02/14/2019   ALKPHOS 100 02/14/2019   AST 38 02/14/2019   ALT 32 02/14/2019   PROT 7.1 02/14/2019   ALBUMIN 4.4 02/14/2019   CALCIUM 10.1 03/18/2020   ANIONGAP 13 02/06/2019   EGFR 87 (L) 12/31/2014   GFR 86.68 09/07/2018   Lab Results  Component Value Date   CHOL 146 02/20/2020   Lab Results  Component Value Date   HDL 52 02/20/2020   Lab Results  Component Value Date   LDLCALC 73 02/20/2020   Lab Results  Component Value Date   TRIG 116 02/20/2020   Lab Results  Component Value Date   CHOLHDL 2.8 02/20/2020   Lab Results  Component Value Date   HGBA1C 5.5 02/20/2020      Assessment & Plan:   Problem List Items Addressed This Visit      Unprioritized   Dysuria - Primary   Relevant Orders   POCT urinalysis dipstick (  Completed)   Culture, Urine    Patient describes 1 month history of intermittent dysuria. Urine dipstick only shows small leukocytes otherwise negative -Urine culture sent -Drink plenty of fluids -Cover with Keflex 500 mg 3 times daily for 7 days pending culture results -Follow-up immediately for any fever or worsening symptoms  She is strongly advised to get back on her usual blood  pressure medications and she will start these this evening when she gets home.  Meds ordered this encounter  Medications  . cephALEXin (KEFLEX) 500 MG capsule    Sig: Take 1 capsule (500 mg total) by mouth 3 (three) times daily.    Dispense:  21 capsule    Refill:  0    Follow-up: No follow-ups on file.    Carolann Littler, MD

## 2020-04-22 NOTE — Addendum Note (Signed)
Addended by: Marrion Coy on: 04/22/2020 02:30 PM   Modules accepted: Orders

## 2020-04-22 NOTE — Patient Instructions (Signed)

## 2020-04-22 NOTE — Addendum Note (Signed)
Addended by: Marrion Coy on: 04/22/2020 02:31 PM   Modules accepted: Orders

## 2020-04-23 LAB — URINE CULTURE
MICRO NUMBER:: 11209710
SPECIMEN QUALITY:: ADEQUATE

## 2020-04-24 ENCOUNTER — Telehealth: Payer: Self-pay

## 2020-04-24 NOTE — Telephone Encounter (Signed)
-----   Message from Eulas Post, MD sent at 04/23/2020  9:33 PM EST ----- Urine cx shows likely non-pathologic colonizers.  Finish out Keflex.

## 2020-04-24 NOTE — Telephone Encounter (Signed)
Patient aware of results and recommendations. °

## 2020-05-05 ENCOUNTER — Other Ambulatory Visit: Payer: Self-pay

## 2020-05-05 ENCOUNTER — Ambulatory Visit (INDEPENDENT_AMBULATORY_CARE_PROVIDER_SITE_OTHER): Payer: Medicare Other | Admitting: Obstetrics and Gynecology

## 2020-05-05 ENCOUNTER — Encounter: Payer: Self-pay | Admitting: Obstetrics and Gynecology

## 2020-05-05 VITALS — BP 140/82 | Ht 64.0 in | Wt 182.0 lb

## 2020-05-05 DIAGNOSIS — R102 Pelvic and perineal pain: Secondary | ICD-10-CM | POA: Diagnosis not present

## 2020-05-05 DIAGNOSIS — N852 Hypertrophy of uterus: Secondary | ICD-10-CM

## 2020-05-05 NOTE — Progress Notes (Signed)
Deborah Wise 07/19/1950 [id number]  SUBJECTIVE:  69 y.o. [id number] female referred by her primary care doctor for evaluation of left lower quadrant pain with a history of fibroid uterus.  Patient reports intermittent on and off left lower quadrant pain in the past several months.  She does not associate this with movement, eating, defecation.  Denies blood in stool or melena.  No nausea.  Reports bowel movements every few days, she says she gets very constipated she will take milk of magnesia which will produce a bowel movement.  Records indicate colonoscopy in 2017 (with polyps found and recommended 7-year follow-up per the report) but patient does not recall if she has had a colonoscopy.  Recently treated empirically with Keflex for presumed UTI. LLQ pain is intermittent and more like a pressure sensation.  No migration or radiation.  Not severe pain.  Just enough to "know it is there."  Normal Pap smear noted 02/2017.  Ports normal Pap smear history.  No vaginal bleeding or discharge.     Current Outpatient Medications  Medication Sig Dispense Refill  . Accu-Chek FastClix Lancets MISC 1 each by Does not apply route daily. 100 each 3  . Accu-Chek Softclix Lancets lancets USE AS DIRECTED TO CHECK BLOOD SUGAR TWICE A DAY 100 each 0  . amLODipine (NORVASC) 10 MG tablet Take 1 tablet (10 mg total) by mouth daily. 90 tablet 1  . ASPIRIN LOW DOSE 81 MG EC tablet TAKE 1 TABLET BY MOUTH EVERY DAY (Patient taking differently: Take 81 mg by mouth daily. ) 90 tablet 1  . Blood Glucose Monitoring Suppl (ACCU-CHEK GUIDE) w/Device KIT Please specify directions, refills and quantity 1 kit 0  . cephALEXin (KEFLEX) 500 MG capsule Take 1 capsule (500 mg total) by mouth 3 (three) times daily. 21 capsule 0  . chlorthalidone (HYGROTON) 25 MG tablet Take 25 mg by mouth daily.    . Cholecalciferol 25 MCG (1000 UT) capsule Take 1,000 Units by mouth daily.     . cyanocobalamin 1000 MCG tablet Take 1,000 mcg by mouth  daily.     [name] Kitchen glucose blood (ACCU-CHEK GUIDE) test strip 1 each by Other route in the morning and at bedtime. Dx E11.9 100 strip 0  . hydrALAZINE (APRESOLINE) 25 MG tablet Take 25 mg by mouth daily.    . hydrochlorothiazide (HYDRODIURIL) 25 MG tablet Take 1 tablet (25 mg total) by mouth daily. 90 tablet 1  . lisinopril (ZESTRIL) 20 MG tablet TAKE ONE TABLET BY MOUTH ONCE DAILY FOR blood pressure/heart (Patient taking differently: Take 20 mg by mouth daily. ) 90 tablet 0  . meloxicam (MOBIC) 15 MG tablet Take 1 tablet (15 mg total) by mouth daily. (Patient taking differently: Take 15 mg by mouth as needed. ) 30 tablet 0  . metFORMIN (GLUCOPHAGE) 500 MG tablet Take 1 tablet (500 mg total) by mouth daily with breakfast.    . REPATHA SURECLICK 480 MG/ML SOAJ INJECT 165m into THE SKIN every 14 DAYS 2 mL 1  . triamcinolone cream (KENALOG) 0.1 % APPLY TO AFFECTED AREA TWICE A DAY (Patient taking differently: Apply 1 application topically daily as needed (Rash). ) 30 g 2   Current Facility-Administered Medications  Medication Dose Route Frequency Provider Last Rate Last Admin  . sodium chloride flush (NS) 0.9 % injection 3 mL  3 mL Intravenous Q12H SJerline Pain MD       Allergies: Patient has no known allergies.  No LMP recorded. Patient is postmenopausal.  Past  medical history,surgical history, problem list, medications, allergies, family history and social history were all reviewed and documented as reviewed in the EPIC chart.  ROS: Positives and negatives as reviewed in HPI   OBJECTIVE:  BP 140/82   Ht 5' 4"  (1.626 m)   Wt 182 lb (82.6 kg)   BMI 31.24 kg/m  The patient appears well, alert, oriented x 3, in no distress. ABDOMEN: Firm up to level of umbilicus, mild tenderness on right midabdomen, no tenderness in remainder of abdomen, no rebound or guarding PELVIC EXAM: VULVA: normal appearing vulva with atrophic change, no masses, tenderness or lesions, VAGINA: normal appearing vagina with  trophic change, normal color and discharge, no lesions, CERVIX: Deep in vaginal canal, barely able to palpate or visualize, UTERUS: Enlarged uterus to approximately 16-18 weeks size, mild tenderness on the right aspect of the fundus, remainder of uterus is nontender, seems to be fairly fixed in place, ADNEXA: Difficult to assess due to bulk of uterus  Chaperone: Thurnell Garbe present during the examination  ASSESSMENT:  69 y.o. [id number] here for evaluation of fibroid uterus and intermittent left lower quadrant pain  PLAN:  The patient has an enlarged uterus on her examination today.  Abdominal exam is benign. Patient sounds to have constipation.  I encouraged her to start on a more regular bowel regimen with Colace tablets 1-2 times a day and/or MiraLAX.  Sometimes LLQ pain can result from delayed stool transit.  She also does have an enlarged uterus on the examination so with limited ability to evaluate the adnexa I have requested that she get a pelvic ultrasound which she will plan to get scheduled. She has fibroids and we briefly discussed that fibroids typically will shrink in menopause.  Sometimes if big enough they can cause bulk symptoms and affect bowel movement function.  We will see what the pelvic ultrasound shows and continue our discussion after that.   Joseph Pierini MD 05/05/20

## 2020-05-06 ENCOUNTER — Ambulatory Visit (HOSPITAL_COMMUNITY): Admission: RE | Admit: 2020-05-06 | Payer: Medicare Other | Source: Ambulatory Visit

## 2020-05-14 ENCOUNTER — Telehealth: Payer: Self-pay | Admitting: *Deleted

## 2020-05-14 NOTE — Telephone Encounter (Signed)
Answered additional clinical questions on CMM. Your information has been sent to [facility].

## 2020-05-14 NOTE — Telephone Encounter (Signed)
Received e mail form CMM: documents are attached.  Your information has been sent to [facility].

## 2020-05-14 NOTE — Telephone Encounter (Signed)
Repatha PA, key: [id number], hyperlipidemia E78.5, hx of stroke Z86.73. Sent office notes to be attached to PA.

## 2020-05-15 DIAGNOSIS — S62309A Unspecified fracture of unspecified metacarpal bone, initial encounter for closed fracture: Secondary | ICD-10-CM | POA: Insufficient documentation

## 2020-05-15 DIAGNOSIS — S62306D Unspecified fracture of fifth metacarpal bone, right hand, subsequent encounter for fracture with routine healing: Secondary | ICD-10-CM | POA: Diagnosis not present

## 2020-05-15 NOTE — Telephone Encounter (Signed)
Called patient to get current insurance information. She will call back.

## 2020-05-15 NOTE — Telephone Encounter (Signed)
Received on CMM: Repatha PA- We have a prior authorization request pending review for the requested drug for this member. For questions regarding this member request, please contact the phone number on the back of the prescription ID card.

## 2020-05-16 ENCOUNTER — Telehealth: Payer: Self-pay | Admitting: Pharmacist

## 2020-05-16 NOTE — Telephone Encounter (Signed)
Pt has called to give her insurance [employer] EQ#[id number] [phone] [employer] FK#[id number] Medicaid OB#[id number] S

## 2020-05-16 NOTE — Chronic Care Management (AMB) (Signed)
Chronic Care Management Pharmacy Assistant   Name: Deborah Wise  MRN: [id number] DOB: [birth year]  Reason for Encounter: Medication Review  PCP : Isaac Bliss, Rayford Halsted, MD  Allergies:  No Known Allergies  Medications: Outpatient Encounter Medications as of 05/16/2020  Medication Sig  . Accu-Chek FastClix Lancets MISC 1 each by Does not apply route daily.  . Accu-Chek Softclix Lancets lancets USE AS DIRECTED TO CHECK BLOOD SUGAR TWICE A DAY  . amLODipine (NORVASC) 10 MG tablet Take 1 tablet (10 mg total) by mouth daily.  . ASPIRIN LOW DOSE 81 MG EC tablet TAKE 1 TABLET BY MOUTH EVERY DAY (Patient taking differently: Take 81 mg by mouth daily. )  . Blood Glucose Monitoring Suppl (ACCU-CHEK GUIDE) w/Device KIT Please specify directions, refills and quantity  . cephALEXin (KEFLEX) 500 MG capsule Take 1 capsule (500 mg total) by mouth 3 (three) times daily.  . chlorthalidone (HYGROTON) 25 MG tablet Take 25 mg by mouth daily.  . Cholecalciferol 25 MCG (1000 UT) capsule Take 1,000 Units by mouth daily.   . cyanocobalamin 1000 MCG tablet Take 1,000 mcg by mouth daily.   [name] Kitchen glucose blood (ACCU-CHEK GUIDE) test strip 1 each by Other route in the morning and at bedtime. Dx E11.9  . hydrALAZINE (APRESOLINE) 25 MG tablet Take 25 mg by mouth daily.  . hydrochlorothiazide (HYDRODIURIL) 25 MG tablet Take 1 tablet (25 mg total) by mouth daily.  [name] Kitchen lisinopril (ZESTRIL) 20 MG tablet TAKE ONE TABLET BY MOUTH ONCE DAILY FOR blood pressure/heart (Patient taking differently: Take 20 mg by mouth daily. )  . meloxicam (MOBIC) 15 MG tablet Take 1 tablet (15 mg total) by mouth daily. (Patient taking differently: Take 15 mg by mouth as needed. )  . metFORMIN (GLUCOPHAGE) 500 MG tablet Take 1 tablet (500 mg total) by mouth daily with breakfast.  . REPATHA SURECLICK 025 MG/ML SOAJ INJECT 164m into THE SKIN every 14 DAYS  . triamcinolone cream (KENALOG) 0.1 % APPLY TO AFFECTED AREA TWICE A DAY (Patient  taking differently: Apply 1 application topically daily as needed (Rash). )   Facility-Administered Encounter Medications as of 05/16/2020  Medication  . sodium chloride flush (NS) 0.9 % injection 3 mL    Current Diagnosis: Patient Active Problem List   Diagnosis Date Noted  . Peripheral arterial disease ([facility] 04/15/2020  . Coronary artery disease   . Chronic diastolic CHF (congestive heart failure) ([facility] 03/12/2020  . Dysuria 03/01/2020  . Chronic arthropathy 11/07/2019  . Hallux limitus of right foot 11/07/2019  . Diabetic neuropathy ([place] 04/04/2019  . Cirrhosis of liver ([facility] 06/22/2018  . Chronic hepatitis ([facility] 06/22/2018  . Hyperlipidemia associated with type 2 diabetes mellitus ([place] 06/22/2018  . Carotid artery disease ([place] 06/22/2018  . 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[facility] 07/02/2016  . Aneurysm of middle cerebral artery   . Cerebrovascular accident (CVA) due to thrombosis of left vertebral artery ([facility] 06/28/2016  . Cocaine abuse ([facility]   . Stroke (cerebrum) ([facility] 06/27/2016  . Microcytic anemia 12/27/2014  . Thrombocytopenia ([place] 12/25/2014  . Hypoglycemia 07/21/2013  . Diabetes mellitus ([facility] 07/21/2013  . 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 Liver disease 07/21/2013  . Alcohol abuse 07/21/2013    Goals Addressed   None     Reviewed chart for medication changes ahead of medication coordination call. Reviewed  OVs, Consults, or hospital visits since last care coordination call/Pharmacist visit.  . 04-22-20 Burchette, BAlinda Sierras MD Family Medicine No medication changes indicated.  BP  Readings from Last 3 Encounters:  05/05/20 140/82  04/22/20 (!) 181/81  04/15/20 140/68    Lab Results  Component Value Date   HGBA1C 5.5 02/20/2020     Patient obtains medications through Adherence Packaging  30 Days   Last adherence delivery included:  [name] Kitchen ACCU- Check Guide test strip . Amlodipine 10 mg: one tablet at breakfast . Aspirin 81 mg: one tablet at  breakfast . Vitamin D3 MCG 1000-unit: one capsule at breakfast . Vitamin B-12 Daily 1000 MCG: one tablet at breakfast . Repatha Sureclick 699 MG/ML Injection- Every 14 Days . Hydrochlorothiazide 25 mg: one tablet at breakfast . Lisinopril 20 mg: one tablet at breakfast . Meloxicam 15 mg Daily Tablet- PRN -Vials . Metformin 500 mg: one tablet at breakfast . Triamcinolone cream -PRN Patient is due for next adherence delivery on: 05-21-2020. Called patient and reviewed medications and coordinated delivery.  I spoke with the patient and review medications. There are no changes in medications currently. The patient is taking the following medications: She declined them due to supply on hand. [name] Kitchen ACCU- Check Guide test strip . Lisinopril 20 mg: one tablet at breakfast .  This delivery to include: . ACCU- Check softclix lancets . Amlodipine 10 mg: one tablet at breakfast . Aspirin 81 mg: one tablet at breakfast . Vitamin D3 MCG 1000-unit: one capsule at breakfast . Vitamin B-12 Daily 1000 MCG: one tablet at breakfast . Repatha Sureclick 967 MG/ML Injection- Every 14 Days . Hydrochlorothiazide 25 mg: one tablet at breakfast . Metformin 500 mg: one tablet at breakfast . Triamcinolone cream (KENALOG) 0.1 %  He currently does not need refills. Confirmed delivery date of 05-21-2020, advised patient that pharmacy will contact them the morning of delivery.  Follow-Up:  Coordination of Enhanced Pharmacy Services   [place] ([place]) Mare Ferrari, [place] Assistant [phone]

## 2020-05-19 ENCOUNTER — Other Ambulatory Visit: Payer: Self-pay | Admitting: Internal Medicine

## 2020-05-19 DIAGNOSIS — L2082 Flexural eczema: Secondary | ICD-10-CM

## 2020-05-19 NOTE — Telephone Encounter (Addendum)
Called [facility] PA dept to check status of Repatha auth. Spoke with Collier Salina who stated medication is already approved through 06/06/2021, [place] [id number]. Called patient and advised her. Patient verbalized understanding, appreciation.

## 2020-05-20 ENCOUNTER — Telehealth: Payer: Self-pay | Admitting: Internal Medicine

## 2020-05-20 ENCOUNTER — Telehealth: Payer: Self-pay | Admitting: Pharmacist

## 2020-05-20 NOTE — Progress Notes (Signed)
Chronic Care Management Pharmacy Assistant   Name: Deborah Wise  MRN: [id number] DOB: [birth year]  Reason for Encounter: Medication Adherence Call   PCP : Isaac Bliss, Rayford Halsted, MD  Allergies:  No Known Allergies  Medications: Outpatient Encounter Medications as of 05/20/2020  Medication Sig Note  . Accu-Chek FastClix Lancets MISC 1 each by Does not apply route daily.   . Accu-Chek Softclix Lancets lancets USE AS DIRECTED TO CHECK BLOOD SUGAR TWICE A DAY   . amLODipine (NORVASC) 10 MG tablet Take 1 tablet (10 mg total) by mouth daily.   . ASPIRIN LOW DOSE 81 MG EC tablet TAKE 1 TABLET BY MOUTH EVERY DAY (Patient taking differently: Take 81 mg by mouth daily. )   . Blood Glucose Monitoring Suppl (ACCU-CHEK GUIDE) w/Device KIT Please specify directions, refills and quantity   . cephALEXin (KEFLEX) 500 MG capsule Take 1 capsule (500 mg total) by mouth 3 (three) times daily.   . chlorthalidone (HYGROTON) 25 MG tablet Take 25 mg by mouth daily.   . Cholecalciferol 25 MCG (1000 UT) capsule Take 1,000 Units by mouth daily.    . cyanocobalamin 1000 MCG tablet Take 1,000 mcg by mouth daily.    [name] Kitchen glucose blood (ACCU-CHEK GUIDE) test strip 1 each by Other route in the morning and at bedtime. Dx E11.9   . hydrALAZINE (APRESOLINE) 25 MG tablet Take 25 mg by mouth daily.   . hydrochlorothiazide (HYDRODIURIL) 25 MG tablet Take 1 tablet (25 mg total) by mouth daily.   [name] Kitchen lisinopril (ZESTRIL) 20 MG tablet TAKE ONE TABLET BY MOUTH ONCE DAILY FOR blood pressure/heart (Patient taking differently: Take 20 mg by mouth daily. )   . meloxicam (MOBIC) 15 MG tablet Take 1 tablet (15 mg total) by mouth daily. (Patient taking differently: Take 15 mg by mouth as needed. )   . metFORMIN (GLUCOPHAGE) 500 MG tablet Take 1 tablet (500 mg total) by mouth daily with breakfast.   . REPATHA SURECLICK 361 MG/ML SOAJ INJECT 18m into THE SKIN every 14 DAYS 05/19/2020: 05/19/20 [facility] approved through  06/06/2021.   . triamcinolone (KENALOG) 0.1 % APPLY TO THE AFFECTED AREA(S) TWICE DAILY FOR SKIN condition    Facility-Administered Encounter Medications as of 05/20/2020  Medication  . sodium chloride flush (NS) 0.9 % injection 3 mL    Current Diagnosis: Patient Active Problem List   Diagnosis Date Noted  . Peripheral arterial disease (HV[place] 04/15/2020  . Coronary artery disease   . Chronic diastolic CHF (congestive heart failure) (H[place] 03/12/2020  . Dysuria 03/01/2020  . Chronic arthropathy 11/07/2019  . Hallux limitus of right foot 11/07/2019  . Diabetic neuropathy (HGalliano 04/04/2019  . Cirrhosis of liver (HAgar 06/22/2018  . Chronic hepatitis (H[place] 06/22/2018  . Hyperlipidemia associated with type 2 diabetes mellitus (HThrall 06/22/2018  . Carotid artery disease (HGlencoe 06/22/2018  . 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HKaaawa 07/02/2016  . Aneurysm of middle cerebral artery   . Cerebrovascular accident (CVA) due to thrombosis of left vertebral artery (H[place] 06/28/2016  . Cocaine abuse (HTiskilwa   . Stroke (cerebrum) (HRiver Falls 06/27/2016  . Microcytic anemia 12/27/2014  . Thrombocytopenia (HRiner 12/25/2014  . Hypoglycemia 07/21/2013  . Diabetes mellitus (HRoyalton 07/21/2013  . 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 Liver disease 07/21/2013  . Alcohol abuse 07/21/2013    Goals Addressed   None     Follow-Up:  Pharmacist Review   Review the patients chart due to her being on medication adherence list for Atorvastatin. After  careful review of her chart and medication profile in [place] I did not see where the patient is currently taking Atorvastatin.   Called to confirm with the patient to see if she has ever taken the medication and she stated that she has not taken Atorvastatin before.   Rosendo Gros, [facility]  Practice Team Manager/ CPA (Clinical Pharmacist Assistant) [phone]

## 2020-05-20 NOTE — Telephone Encounter (Signed)
Patient is calling and stated that she doesn't like to prick her fingers and wanted to see if there was another way that she can check her sugar levels, please advise. CB is [phone]

## 2020-05-20 NOTE — Telephone Encounter (Signed)
Okay to send in [facility] system?

## 2020-05-20 NOTE — Telephone Encounter (Signed)
Ok for [employer] [facility]. I also have a sample in my office if she would like to pick one up.

## 2020-05-22 ENCOUNTER — Other Ambulatory Visit: Payer: Self-pay

## 2020-05-22 ENCOUNTER — Telehealth: Payer: Self-pay | Admitting: *Deleted

## 2020-05-22 ENCOUNTER — Ambulatory Visit (INDEPENDENT_AMBULATORY_CARE_PROVIDER_SITE_OTHER): Payer: Medicare Other | Admitting: Obstetrics and Gynecology

## 2020-05-22 ENCOUNTER — Ambulatory Visit (INDEPENDENT_AMBULATORY_CARE_PROVIDER_SITE_OTHER): Payer: Medicare Other

## 2020-05-22 ENCOUNTER — Encounter: Payer: Self-pay | Admitting: Obstetrics and Gynecology

## 2020-05-22 VITALS — BP 122/82

## 2020-05-22 DIAGNOSIS — R102 Pelvic and perineal pain: Secondary | ICD-10-CM

## 2020-05-22 DIAGNOSIS — N852 Hypertrophy of uterus: Secondary | ICD-10-CM

## 2020-05-22 DIAGNOSIS — N3289 Other specified disorders of bladder: Secondary | ICD-10-CM | POA: Diagnosis not present

## 2020-05-22 DIAGNOSIS — D259 Leiomyoma of uterus, unspecified: Secondary | ICD-10-CM | POA: Diagnosis not present

## 2020-05-22 DIAGNOSIS — N319 Neuromuscular dysfunction of bladder, unspecified: Secondary | ICD-10-CM

## 2020-05-22 MED ORDER — FREESTYLE LIBRE 14 DAY SENSOR MISC: 5 refills | Status: DC

## 2020-05-22 MED ORDER — FREESTYLE LIBRE 14 DAY READER DEVI: 0 refills | Status: DC

## 2020-05-22 NOTE — Progress Notes (Signed)
   Deborah Wise May 01, 1951 [id number]  SUBJECTIVE:  69 y.o. [id number] female presents for pelvic ultrasound to evaluate an enlarged uterus noted on her initial exam 05/05/2020 when she was referred for evaluation of intermittent left lower quadrant pain.  See that encounter note for details.  Allergies: Patient has no known allergies.  No LMP recorded. Patient is postmenopausal.  Past medical history,surgical history, problem list, medications, allergies, family history and social history were all reviewed and documented as reviewed in the EPIC chart.   OBJECTIVE:  BP 122/82  The patient appears well, alert, oriented, in no distress. PELVIC EXAM: Deferred, performed at the last visit  Pelvic ultrasound Enlarged anteverted uterus 16.6 x 13.4 x 11.3 cm.  Several calcified fibroids the largest measuring 5.4 x 5.0 cm, others measuring in the 3.4 to 4.8 cm range.  Endometrial lining not well visualized due to distortion from fibroids. Bilateral ovaries appear normal and atrophic.  No adnexal masses.  No free fluid. Bladder is notably distended, the second of 2 post void ultrasound measurements indicated the bladder was still measuring 12 x 8 cm.  ASSESSMENT:  69 y.o. [id number] here for pelvic ultrasound, fibroid uterus, suspected neurogenic bladder  PLAN:  We discussed that one theory for her pelvic pain could be the fibroids and involution as she gets further into menopause, we discussed the hormonally responsive tissue that makes up fibroids can lead to shrinkage as a woman progresses through menopause.  Her fibroids are small to medium size and may cause her some discomfort symptoms, but I am not sure that this is the only explanation.  Her ovaries and adnexa look fine bilaterally.  I recommend annual pelvic exams for follow-up of the fibroids. Her bladder is notably very distended and she seems to be retaining a lot of urine.  The bladder distention could also be causing her pelvic  discomfort.  She did previously have a CVA in 2018.  Question if she has a neurogenic bladder and I would recommend referral to urology for further evaluation and management.  We will have staff contact her about this referral.   Joseph Pierini MD 05/22/20

## 2020-05-22 NOTE — Telephone Encounter (Signed)
-----   Message from Joseph Pierini, MD sent at 05/22/2020 10:28 AM EST ----- Regarding: urology referral Patient with previous history of stroke and pelvic pain.  She does have fibroids but her bladder was very distended when measured after voiding, I question if she has a neurogenic bladder that needs evaluation and management.  Please help her with referral to urology.

## 2020-05-22 NOTE — Telephone Encounter (Signed)
I called and spoke with patient and explained we are referring patients to [facility] group location in [place]. Patient said okay to place referral, they will call to schedule.

## 2020-05-22 NOTE — Telephone Encounter (Signed)
Rx sent 

## 2020-05-23 DIAGNOSIS — M79641 Pain in right hand: Secondary | ICD-10-CM | POA: Diagnosis not present

## 2020-05-26 NOTE — Telephone Encounter (Signed)
Patient asked me to cancel appointment scheduled 07/2020 at [facility] urology [place]. Appointment canceled. Patient will have to be seen at [facility] location due to transportation.

## 2020-05-27 NOTE — Telephone Encounter (Signed)
Spoke with Alycia at uro-gyn and was told patient can be referred to that office. Referral placed they will call to schedule.

## 2020-05-28 ENCOUNTER — Ambulatory Visit: Payer: Medicare Other | Admitting: *Deleted

## 2020-05-28 ENCOUNTER — Ambulatory Visit: Payer: Medicare Other | Attending: Critical Care Medicine

## 2020-05-28 ENCOUNTER — Telehealth: Payer: Self-pay | Admitting: Internal Medicine

## 2020-05-28 ENCOUNTER — Telehealth: Payer: Self-pay | Admitting: Pharmacist

## 2020-05-28 DIAGNOSIS — Z23 Encounter for immunization: Secondary | ICD-10-CM

## 2020-05-28 NOTE — Telephone Encounter (Signed)
Pt is calling in stating that the pharmacy is stating that she needs a PA for a FREESTYLE LIBRE (device and sensors).  Pt is aware that her provider is not in the office this week.

## 2020-05-28 NOTE — Chronic Care Management (AMB) (Signed)
Chronic Care Management Pharmacy Assistant   Name: Deborah Wise  MRN: [id number] DOB: 1950/10/04  Reason for Encounter: Medication Review  PCP : Isaac Bliss, Rayford Halsted, MD  Allergies:  No Known Allergies  Medications: Outpatient Encounter Medications as of 05/28/2020  Medication Sig Note  . Accu-Chek FastClix Lancets MISC 1 each by Does not apply route daily.   . Accu-Chek Softclix Lancets lancets USE AS DIRECTED TO CHECK BLOOD SUGAR TWICE A DAY   . amLODipine (NORVASC) 10 MG tablet Take 1 tablet (10 mg total) by mouth daily.   . ASPIRIN LOW DOSE 81 MG EC tablet TAKE 1 TABLET BY MOUTH EVERY DAY (Patient taking differently: Take 81 mg by mouth daily.)   . Blood Glucose Monitoring Suppl (ACCU-CHEK GUIDE) w/Device KIT Please specify directions, refills and quantity   . cephALEXin (KEFLEX) 500 MG capsule Take 1 capsule (500 mg total) by mouth 3 (three) times daily. (Patient not taking: Reported on 05/22/2020)   . chlorthalidone (HYGROTON) 25 MG tablet Take 25 mg by mouth daily.   . Cholecalciferol 25 MCG (1000 UT) capsule Take 1,000 Units by mouth daily.    . Continuous Blood Gluc Receiver (FREESTYLE LIBRE 14 DAY READER) DEVI Use daily for glucose control.  Dx E11.9   . Continuous Blood Gluc Sensor (FREESTYLE LIBRE 14 DAY SENSOR) MISC Use daily for glucose control.  Dx E11.9   . cyanocobalamin 1000 MCG tablet Take 1,000 mcg by mouth daily.    [name] Kitchen glucose blood (ACCU-CHEK GUIDE) test strip 1 each by Other route in the morning and at bedtime. Dx E11.9   . hydrALAZINE (APRESOLINE) 25 MG tablet Take 25 mg by mouth daily.   . hydrochlorothiazide (HYDRODIURIL) 25 MG tablet Take 1 tablet (25 mg total) by mouth daily.   [name] Kitchen lisinopril (ZESTRIL) 20 MG tablet TAKE ONE TABLET BY MOUTH ONCE DAILY FOR blood pressure/heart (Patient taking differently: Take 20 mg by mouth daily.)   . meloxicam (MOBIC) 15 MG tablet Take 1 tablet (15 mg total) by mouth daily. (Patient taking differently: Take 15 mg  by mouth as needed.)   . metFORMIN (GLUCOPHAGE) 500 MG tablet Take 1 tablet (500 mg total) by mouth daily with breakfast.   . REPATHA SURECLICK 132 MG/ML SOAJ INJECT 115m into THE SKIN every 14 DAYS 05/19/2020: 05/19/20 [facility] rx approved through 06/06/2021.   . triamcinolone (KENALOG) 0.1 % APPLY TO THE AFFECTED AREA(S) TWICE DAILY FOR SKIN condition    Facility-Administered Encounter Medications as of 05/28/2020  Medication  . sodium chloride flush (NS) 0.9 % injection 3 mL    Current Diagnosis: Patient Active Problem List   Diagnosis Date Noted  . Peripheral arterial disease (HBr[place] 04/15/2020  . Coronary artery disease   . Chronic diastolic CHF (congestive heart failure) (HBas[place] 03/12/2020  . Dysuria 03/01/2020  . Chronic arthropathy 11/07/2019  . Hallux limitus of right foot 11/07/2019  . Diabetic neuropathy (HPepeekeo 04/04/2019  . Cirrhosis of liver (HLodge Grass 06/22/2018  . Chronic hepatitis (HNorth Creek 06/22/2018  . Hyperlipidemia associated with type 2 diabetes mellitus (HTuskegee 06/22/2018  . Carotid artery disease (HGrafton 06/22/2018  . 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HCashmere 07/02/2016  . Aneurysm of middle cerebral artery   . 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 Cocaine abuse (HGarland   . Stroke (cerebrum) (HRiesel 06/27/2016  . Microcytic anemia 12/27/2014  . Thrombocytopenia (HDanville 12/25/2014  . Hypoglycemia 07/21/2013  . Diabetes mellitus (HRaceland 07/21/2013  . 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  Liver disease 07/21/2013  . Alcohol abuse 07/21/2013    Goals Addressed   None     Received call from patient regarding medication management via [facility].  Patient requested an acute fill for [facility] ad sensor to be delivered: 05/28/2199 Pharmacy needs refills? No  Follow-Up:  Coordination of Enhanced Pharmacy Services   Her insurance is requiring prior authorization for both the glucose receiver and sensors. The  patient was asked to contact her PCP. She understood.  Maia Breslow, [place] Assistant [phone]

## 2020-05-28 NOTE — Progress Notes (Signed)
Patient presents for vaccine injection today. Patient tolerated injection well and was observed without any concerns.  

## 2020-05-29 NOTE — Telephone Encounter (Signed)
Patient scheduled on 06/10/20 with [name], MD

## 2020-06-02 NOTE — Progress Notes (Signed)
[facility] New Patient Evaluation and Consultation  Referring Provider: Joseph Pierini, MD PCP: Isaac Bliss, Rayford Halsted, MD Date of Service: 06/10/2020  SUBJECTIVE Chief Complaint: New Patient (Initial Visit) (Dr Delilah Shan referral)  History of Present Illness: Deborah Wise is a 69 y.o. Black or African-American female seen in consultation at the request of Dr. Delilah Shan for evaluation of neurogenic bladder.    Review of records significant for: History of stroke and pelvic pain. Bladder distended after voiding.   Pelvic ultrasound 05/22/20 Enlarged anteverted uterus 16.6 x 13.4 x 11.3 cm.  Several calcified fibroids the largest measuring 5.4 x 5.0 cm, others measuring in the 3.4 to 4.8 cm range.  Endometrial lining not well visualized due to distortion from fibroids. Bilateral ovaries appear normal and atrophic.  No adnexal masses.  No free fluid. Bladder is notably distended, the second of 2 post void ultrasound measurements indicated the bladder was still measuring 12 x 8 cm.  Urinary Symptoms: Leaks urine with movement to the bathroom Leaks 2 time(s) per days.  Pad use: none She is not bothered by her UI symptoms. Drinks: 2-16oz bottles of water a day, otherwise drinks beer or wine  Day time voids 6.  Nocturia: 2 times per night to void. Voiding dysfunction: she does not empty her bladder well. Has noticed difficulty emptying for about a year.  does not use a catheter to empty bladder. When urinating, she feels difficulty starting urine stream and to push on her belly to empty bladder  UTIs: 1 UTI's in the last year.   Denies history of blood in urine and kidney or bladder stones  Pelvic Organ Prolapse Symptoms:                  She Denies a feeling of a bulge the vaginal area.  Bowel Symptom: Bowel movements: sometimes every other day  Stool consistency: hard Straining: no.  Splinting: no.  Incomplete evacuation: no.  She Denies accidental bowel  leakage / fecal incontinence Bowel regimen: sometimes will take milk of magnesia  Last colonoscopy: Date 2018, Results negative  Sexual Function Sexually active: yes.  Pain with sex: No  Pelvic Pain Admits to pelvic pain Pain occurs: randomly Prior pain treatment: none Improved by: nothing Worsened by: bowel movements and straining   Past Medical History:  Past Medical History:  Diagnosis Date  . Anemia   . Arthritis   . Blood transfusion without reported diagnosis   . Carotid artery occlusion   . Cirrhosis ([place])   . Diabetes mellitus without complication ([place])   . Fibroid, uterine   . GERD (gastroesophageal reflux disease)   . Hepatitis    hep C  . Hypertension   . Stroke ([place])   . Thrombocytopenia ([place]) 12/25/2014  . Urinary incontinence    Patient noticed mild and is not currently a significant problem     Past Surgical History:   Past Surgical History:  Procedure Laterality Date  . ANKLE FRACTURE SURGERY Right 1999   x 2, rod  . CESAREAN SECTION     w/ tubal ligation  . COLONOSCOPY    . IR GENERIC HISTORICAL  06/30/2016   IR ANGIO VERTEBRAL SEL SUBCLAVIAN INNOMINATE UNI R MOD SED 06/30/2016 [name], MD [facility]  . IR GENERIC HISTORICAL  06/30/2016   IR ANGIO VERTEBRAL SEL VERTEBRAL UNI L MOD SED 06/30/2016 [name], MD [facility]  . IR GENERIC HISTORICAL  06/30/2016   IR ANGIO INTRA EXTRACRAN SEL COM CAROTID  INNOMINATE BILAT MOD SED 06/30/2016 [name], MD [facility]  . LEFT HEART CATH AND CORONARY ANGIOGRAPHY N/A 03/25/2020   Procedure: LEFT HEART CATH AND CORONARY ANGIOGRAPHY;  Surgeon: Jettie Booze, MD;  Location: [facility];  Service: Cardiovascular;  Laterality: N/A;  . MULTIPLE EXTRACTIONS WITH ALVEOLOPLASTY  10/25/2011   Procedure: MULTIPLE EXTRACION WITH ALVEOLOPLASTY;  Surgeon: Gae Bon, DDS;  Location: [place];  Service: Oral Surgery;  Laterality: Bilateral;  Extract teeth numbers one, two, six,  seven, eight, nine, ten, eleven, twelve, fifteen, sixteen, eighteen, nineteen, twenty-three, twenty-four, twenty-five, twenty-seven, thirty-two and alveoplasty.     Past OB/GYN History: G4 P3 Vaginal deliveries: 2,  Forceps/ Vacuum deliveries: 0, Cesarean section: 1 Menopausal: Yes, at age 59, Denies vaginal bleeding since menopause Contraception: n/a. Last pap smear was 2021.  Any history of abnormal pap smears: no.   Medications: She has a current medication list which includes the following prescription(s): accu-chek fastclix lancets, accu-chek softclix lancets, amlodipine, aspirin low dose, accu-chek guide, cholecalciferol, freestyle libre 14 day reader, freestyle libre 14 day sensor, cyanocobalamin, accu-chek guide, hydrochlorothiazide, meloxicam, metformin, oxycodone, repatha sureclick, and triamcinolone, and the following Facility-Administered Medications: sodium chloride flush.   Allergies: Patient has No Known Allergies.   Social History:  Social History   Tobacco Use  . Smoking status: Never Smoker  . Smokeless tobacco: Never Used  Vaping Use  . Vaping Use: Never used  Substance Use Topics  . Alcohol use: Yes    Alcohol/week: 1.0 standard drink    Types: 1 Cans of beer per week    Comment: 1/2 bottle of wine and some beer a day  . Drug use: Not Currently    Frequency: 1.0 times per week    Types: Cocaine    Comment: last use cocaine last year 2018    Relationship status: married She lives with husband.   She is not employed. Regular exercise: Yes: cycling History of abuse: No  Family History:   Family History  Problem Relation Age of Onset  . Diabetes type II Mother   . Hypertension Mother   . Stroke Mother   . Cancer Father        patient does not know what type of cancer  . Diabetes Daughter   . Breast cancer Neg Hx   . Colon polyps Neg Hx   . Crohn's disease Neg Hx   . Esophageal cancer Neg Hx   . Rectal cancer Neg Hx   . Stomach cancer Neg Hx   .  Colon cancer Neg Hx      Review of Systems: Review of Systems  Constitutional: Negative for fever, malaise/fatigue and weight loss.  Respiratory: Negative for cough, shortness of breath and wheezing.   Cardiovascular: Negative for chest pain, palpitations and leg swelling.  Gastrointestinal: Negative for abdominal pain and blood in stool.  Genitourinary: Negative for dysuria.  Musculoskeletal: Negative for myalgias.  Skin: Negative for rash.  Neurological: Negative for dizziness and headaches.  Endo/Heme/Allergies: Does not bruise/bleed easily.  Psychiatric/Behavioral: Negative for depression. The patient is not nervous/anxious.      OBJECTIVE Physical Exam: Vitals:   06/10/20 0906  BP: (!) 191/91  Pulse: 79  Weight: 173 lb (78.5 kg)  Height: 5\' 5"  (1.651 m)    Physical Exam Constitutional:      General: She is not in acute distress. Pulmonary:     Effort: Pulmonary effort is normal.  Abdominal:     General: There is no distension.  Palpations: Abdomen is soft.     Tenderness: There is no abdominal tenderness. There is no rebound.     Comments: Uterus palpated midway to umbilicus  Musculoskeletal:        General: No swelling. Normal range of motion.  Skin:    General: Skin is warm and dry.     Findings: No rash.  Neurological:     Mental Status: She is alert and oriented to person, place, and time.  Psychiatric:        Mood and Affect: Mood normal.        Behavior: Behavior normal.      GU / Detailed Urogynecologic Evaluation:  Pelvic Exam: Normal external female genitalia; Bartholin's and Skene's glands normal in appearance; urethral meatus normal in appearance, no urethral masses or discharge.   CST: negative  Speculum exam reveals normal vaginal mucosa without atrophy. Cervix normal appearance. Uterus enlarged,16 week size. Adnexa not palpable     Pelvic floor strength III/V  Pelvic floor musculature: Right levator tender, Right obturator  non-tender, Left levator non-tender, Left obturator non-tender  POP-Q:   POP-Q  -2                                            Aa   -2                                           Ba  -8.5                                              C   2.5                                            Gh  2.5                                            Pb  10                                            tvl   -1                                            Ap  -1                                            Bp  -9.5  D     Rectal Exam:  Normal external sphincter  Post-Void Residual (PVR) by Bladder Scan: In order to evaluate bladder emptying, we discussed obtaining a postvoid residual and she agreed to this procedure.  Procedure: The ultrasound unit was placed on the patient's abdomen in the suprapubic region after the patient had voided. A PVR of 372 ml was obtained by bladder scan.  Laboratory Results: POC urine: + protein  I visualized the urine specimen, noting the specimen to be dark yellow  ASSESSMENT AND PLAN Ms. Vecchiarelli is a 69 y.o. with:  1. Incomplete bladder emptying   2. Urinary frequency   3. Constipation, unspecified constipation type    1. Incomplete bladder emptying - Likely the cause of intermittent pelvic pain. Unclear if this is due to fibroid/ uterine obstruction or neurogenic bladder. No significant prolapse noted on exam today.  - She was taught to self cath today and demonstrated proper cath technique. She was given supplies and written instructions today and will order catheters, towelettes and lubricant. She will keep a bladder diary for 3 days and cath and measure volumes after each time she voids. Then she can decrease catheterizing to at least 4 times a day.  - Cr normal 3 months ago. Will order renal ultrasound to assess for hydronephrosis.  - Also will plan for Urodynamics with Fluoroscopy (at [facility]) to  further assess her incomplete emptying.   2. Urinary frequency.  - POC urine negative today for infection  3. Constipation - Discussed adding in daily or every other day miralax, as constipation can contribute to incomplete bladder emptying.   Return after renal US and Urodynamic testing.   [name], MD   Medical Decision Making:  - Reviewed/ ordered a clinical laboratory test - Reviewed/ ordered a radiologic study - Reviewed/ ordered medicine test - Review and summation of prior records

## 2020-06-04 DIAGNOSIS — M79641 Pain in right hand: Secondary | ICD-10-CM | POA: Diagnosis not present

## 2020-06-04 NOTE — Telephone Encounter (Signed)
Prior Auth started [employer]

## 2020-06-04 NOTE — Telephone Encounter (Signed)
Pt is calling to check the status of the below msg and would like to have a call once it has been taken care of.  Pt is aware that they are answering the msg as they have come in to the office as well as seeing pts.

## 2020-06-05 NOTE — Telephone Encounter (Signed)
Further questions were answered.  Waiting on [facility] response.

## 2020-06-08 ENCOUNTER — Other Ambulatory Visit: Payer: Self-pay | Admitting: Adult Health

## 2020-06-09 NOTE — Telephone Encounter (Signed)
Pt is calling in to check the status of the device and she was given the below msg.  Pt is calling in stating that she has received something with a big needle on it for her cholesterol and she is not able to do it b/c of the needle she stated that it is not the same as what she had in the past and would like to see if she can get what she had in the past.

## 2020-06-10 ENCOUNTER — Other Ambulatory Visit: Payer: Self-pay | Admitting: Internal Medicine

## 2020-06-10 ENCOUNTER — Encounter: Payer: Self-pay | Admitting: Obstetrics and Gynecology

## 2020-06-10 ENCOUNTER — Other Ambulatory Visit: Payer: Self-pay

## 2020-06-10 ENCOUNTER — Telehealth: Payer: Self-pay | Admitting: Pharmacist

## 2020-06-10 ENCOUNTER — Ambulatory Visit (INDEPENDENT_AMBULATORY_CARE_PROVIDER_SITE_OTHER): Payer: Medicare Other | Admitting: Obstetrics and Gynecology

## 2020-06-10 VITALS — BP 191/91 | HR 79 | Ht 65.0 in | Wt 173.0 lb

## 2020-06-10 DIAGNOSIS — R350 Frequency of micturition: Secondary | ICD-10-CM | POA: Diagnosis not present

## 2020-06-10 DIAGNOSIS — R339 Retention of urine, unspecified: Secondary | ICD-10-CM | POA: Diagnosis not present

## 2020-06-10 DIAGNOSIS — I10 Essential (primary) hypertension: Secondary | ICD-10-CM

## 2020-06-10 DIAGNOSIS — K5900 Constipation, unspecified: Secondary | ICD-10-CM | POA: Diagnosis not present

## 2020-06-10 LAB — POCT URINALYSIS DIPSTICK
Appearance: NORMAL
Bilirubin, UA: NEGATIVE
Blood, UA: NEGATIVE
Glucose, UA: NEGATIVE
Ketones, UA: NEGATIVE
Leukocytes, UA: NEGATIVE
Nitrite, UA: NEGATIVE
Protein, UA: POSITIVE — AB
Spec Grav, UA: 1.01 (ref 1.010–1.025)
Urobilinogen, UA: NEGATIVE E.U./dL — AB
pH, UA: 6 (ref 5.0–8.0)

## 2020-06-10 MED ORDER — SELF-CATH SYSTEM STRAIGHT TIP KIT: 1.0000 | PACK | 11 refills | Status: AC | PRN

## 2020-06-10 NOTE — Telephone Encounter (Signed)
Left message on machine for patient to return our call 

## 2020-06-10 NOTE — Patient Instructions (Addendum)
Constipation: Our goal is to achieve formed bowel movements daily or every-other-day.  You may need to try different combinations of the following options to find what works best for you - everybody's body works differently so feel free to adjust the dosages as needed.  Some options to help maintain bowel health include:  [name] Kitchen Dietary changes (more leafy greens, vegetables and fruits; less processed foods) . Over-the-counter agents such as: stool softeners (Docusate or Colace) and/or laxatives (Miralax, milk of magnesia)  . "Power Pudding" is a natural mixture that may help your constipation.  To make blend 1 cup applesauce, 1 cup wheat bran, and 3/4 cup prune juice, refrigerate and then take 1 tablespoon daily with a large glass of water as needed.   Catheterize at least 4 times per day after urinating. We will call you Friday to review results of bladder diary.

## 2020-06-10 NOTE — Addendum Note (Signed)
Addended by: Marguerita Beards on: 06/10/2020 10:44 AM   Modules accepted: Orders

## 2020-06-10 NOTE — Chronic Care Management (AMB) (Signed)
Chronic Care Management Pharmacy Assistant   Name: Deborah Wise  MRN: [id number] DOB: 05-21-1951  Reason for Encounter: Medication Review  PCP : Isaac Bliss, Rayford Halsted, MD  Allergies:  No Known Allergies  Medications: Outpatient Encounter Medications as of 06/10/2020  Medication Sig  . Accu-Chek FastClix Lancets MISC 1 each by Does not apply route daily.  . Accu-Chek Softclix Lancets lancets USE AS DIRECTED TO CHECK BLOOD SUGAR TWICE A DAY  . amLODipine (NORVASC) 10 MG tablet TAKE ONE TABLET BY MOUTH EVERY MORNING FOR blood pressure  . ASPIRIN LOW DOSE 81 MG EC tablet TAKE 1 TABLET BY MOUTH EVERY DAY (Patient taking differently: Take 81 mg by mouth daily.)  . Blood Glucose Monitoring Suppl (ACCU-CHEK GUIDE) w/Device KIT Please specify directions, refills and quantity  . Catheters (SELF-CATH SYSTEM STRAIGHT TIP) KIT 1 each by Does not apply route as needed.  . Cholecalciferol 25 MCG (1000 UT) capsule Take 1,000 Units by mouth daily.   . Continuous Blood Gluc Receiver (FREESTYLE LIBRE 14 DAY READER) DEVI Use daily for glucose control.  Dx E11.9  . Continuous Blood Gluc Sensor (FREESTYLE LIBRE 14 DAY SENSOR) MISC Use daily for glucose control.  Dx E11.9  . cyanocobalamin 1000 MCG tablet Take 1,000 mcg by mouth daily.   [name] Kitchen glucose blood (ACCU-CHEK GUIDE) test strip 1 each by Other route in the morning and at bedtime. Dx E11.9  . hydrochlorothiazide (HYDRODIURIL) 25 MG tablet TAKE ONE TABLET BY MOUTH EVERY MORNING FOR fluid/blood pressure  . meloxicam (MOBIC) 15 MG tablet Take 1 tablet (15 mg total) by mouth daily. (Patient taking differently: Take 15 mg by mouth as needed.)  . metFORMIN (GLUCOPHAGE) 500 MG tablet Take 1 tablet (500 mg total) by mouth daily with breakfast.  . oxyCODONE (OXY IR/ROXICODONE) 5 MG immediate release tablet Take by mouth.  [name] Kitchen REPATHA SURECLICK 505 MG/ML SOAJ INJECT 136m into THE SKIN every 14 DAYS for cholesterol  . triamcinolone (KENALOG) 0.1 % APPLY  TO THE AFFECTED AREA(S) TWICE DAILY FOR SKIN condition   Facility-Administered Encounter Medications as of 06/10/2020  Medication  . sodium chloride flush (NS) 0.9 % injection 3 mL    Current Diagnosis: Patient Active Problem List   Diagnosis Date Noted  . Peripheral arterial disease ([facility] 04/15/2020  . Coronary artery disease   . Chronic diastolic CHF (congestive heart failure) (HBelle [facility] 03/12/2020  . Dysuria 03/01/2020  . Chronic arthropathy 11/07/2019  . Hallux limitus of right foot 11/07/2019  . Diabetic neuropathy ([place] 04/04/2019  . Cirrhosis of liver (HT[place] 06/22/2018  . Chronic hepatitis (HL[place] 06/22/2018  . Hyperlipidemia associated with type 2 diabetes mellitus (HJ[facility] [place] 06/22/2018  . Carotid artery disease (H[place] 06/22/2018  . 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HLamy 07/02/2016  . Aneurysm of middle cerebral artery   . 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 Cocaine abuse (HPar[facility] Est[facility]   . Stroke (cerebrum) (H[place] 06/27/2016  . Microcytic anemia 12/27/2014  . Thrombocytopenia (HHo[place] 12/25/2014  . Hypoglycemia 07/21/2013  . Diabetes mellitus (H[place] 07/21/2013  . 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 Liver disease 07/21/2013  . Alcohol abuse 07/21/2013    Goals Addressed   None    Reviewed chart for medication changes ahead of medication coordination call. Reviewed OVs, Consults, or hospital visits since last care coordination call/Pharmacist visit.  .[name] Kitchen01-04-22 SJaquita Folds MD Obstetrics and Gynecology Medication Changed indicated ? Cephalexin 500 mg Oral 3 times daily (Completed Course)  ? Chlorthalidone 25  mg Daily (Patient Preference) Patient not taking:  Reported on 06/10/2020 ? Hydralazine HCl 25 mg Daily (Change in therapy) Patient not taking:  Reported on 06/10/2020 ? Lisinopril 20 MG TAKE Patient not taking:  Reported on 06/10/2020 BP Readings from Last 3 Encounters:  06/10/20 (!) 191/91  05/22/20 122/82  05/05/20  140/82    Lab Results  Component Value Date   HGBA1C 5.5 02/20/2020     Patient obtains medications through Adherence Packaging  30 Days  Last adherence delivery included:  [name] Kitchen ACCU- Check softclix lancets . Amlodipine 10 mg: one tablet at breakfast . Aspirin 81 mg: one tablet at breakfast . Vitamin D3 MCG 1000-unit: one capsule at breakfast . Vitamin B-12 Daily 1000 MCG: one tablet at breakfast . Repatha Sureclick 251 MG/ML Injection- Every 14 Days . Hydrochlorothiazide 25 mg: one tablet at breakfast . Metformin 500 mg: one tablet at breakfast . Triamcinolone cream (KENALOG) 0.1 % Patient declined the following medications last month due to additional supply on hand. [name] Kitchen ACCU- Check Guide test strip  Patient is due for next adherence delivery on: 06/20/2020 Called patient and reviewed medications and coordinated delivery. This delivery to include: . Amlodipine 10 mg: one tablet at breakfast . Aspirin 81 mg: one tablet at breakfast . Vitamin D3 MCG 1000-unit: one capsule at breakfast . Vitamin B-12 Daily 1000 MCG: one tablet at breakfast . Repatha Sureclick 898 MG/ML Injection- Every 14 Days . Hydrochlorothiazide 25 mg: one tablet at breakfast . Metformin 500 mg: one tablet at breakfast . Triamcinolone cream (KENALOG) 0.1 %       I spoke with the patient and review medications. There are no changes in medications currently. Patient declined these last month due to PRN use/additional supply on hand.The patient is taking the following medications: . ACCU- Check Guide test strip . ACCU- Check softclix lancets  She does not need refills currently  Confirmed delivery date of 06/20/2020, advised patient that pharmacy will contact them the morning of delivery. Follow-Up:  Care Coordination with Outside Provider and Pharmacist Review   Maia Breslow, [place] Assistant [phone]

## 2020-06-11 DIAGNOSIS — M79641 Pain in right hand: Secondary | ICD-10-CM | POA: Diagnosis not present

## 2020-06-11 NOTE — Telephone Encounter (Signed)
Left message on machine for patient to return our call 

## 2020-06-13 ENCOUNTER — Telehealth: Payer: Self-pay | Admitting: *Deleted

## 2020-06-13 NOTE — Telephone Encounter (Signed)
-----   Message from [name], MD sent at 06/10/2020  9:56 AM EST ----- Regarding: follow up cath volumes Please call patient on 1/7 to review voided and cath volumes over three days. Please document volumes in a note.   Then she can decrease catheterizing to 4 times a day or as needed. She should do it on waking, middle of the day, around dinner then before bed.   Will also order renal ultrasound to be done along with the [facility]rodynamics at [facility]. Then she will need a follow up with me once those tests are done.   Thanks!

## 2020-06-13 NOTE — Telephone Encounter (Signed)
Called pt to discuss voiding and cath amounts over the past few days.  Pt states that she has only done it a few times.  She states that she did not measure how much she voided before doing self cath.  She states that on Wednesday she acquired 14oz by cath.  On Thursday she acquired 4oz in the am and 426ml that afternoon by cath.  Advised pt that we would like for her to void first and measure the amount that she voids and then cath and measure how much urine she gets then.  Advised to do this 4 times a day; on waking, mid day, around dinner and before bed. Advised pt that a renal u/s had been ordered along with urodynamics to be done at [facility] and that she would follow up with Dr Wannetta Sender after those tests were complete. Pt verbalized understanding.  Will follow up with pt next Tuesday.

## 2020-06-17 ENCOUNTER — Telehealth: Payer: Self-pay | Admitting: *Deleted

## 2020-06-17 NOTE — Telephone Encounter (Signed)
LMOVM for pt to return my call.  

## 2020-06-18 ENCOUNTER — Telehealth: Payer: Self-pay | Admitting: *Deleted

## 2020-06-18 DIAGNOSIS — M79641 Pain in right hand: Secondary | ICD-10-CM | POA: Diagnosis not present

## 2020-06-18 DIAGNOSIS — R339 Retention of urine, unspecified: Secondary | ICD-10-CM | POA: Diagnosis not present

## 2020-06-18 NOTE — Telephone Encounter (Signed)
Spoke with Denzil Hughes and they will inform the patient of any copay.

## 2020-06-18 NOTE — Telephone Encounter (Signed)
Called pt regarding urine output results from self-cath.  Pt provided me with the following measurements:  06/14/20-   void: 4oz    Cath:  22 oz 06/15/20-   void: 9oz    Cath:  28oz               void: 4oz    Cath: 14 oz 06/16/20- void: 10oz  Cath: 22oz               void: 9oz    Cath: 9oz               void: 4oz    Cath: 14oz 06/17/20- void: 10oz  Cath: 24oz               void: 5oz    Cath: 8oz               void: 3oz    Cath: 14oz  Advised pt to continue doing what she is doing and to call for her follow-up appointment with Dr. Wannetta Sender after she is seen at [facility].  Pt verbalized understanding.

## 2020-06-20 ENCOUNTER — Telehealth: Payer: Self-pay | Admitting: Internal Medicine

## 2020-06-20 NOTE — Telephone Encounter (Signed)
We do not refer to dentists. I can recommend Conley Simmonds, DDS

## 2020-06-20 NOTE — Telephone Encounter (Signed)
Patient is aware 

## 2020-06-20 NOTE — Telephone Encounter (Signed)
Patient is calling to request a referral to a dentist.  Please advise.

## 2020-06-27 ENCOUNTER — Telehealth: Payer: Self-pay

## 2020-06-27 MED ORDER — FREESTYLE LIBRE 14 DAY SENSOR MISC: 5 refills | Status: DC

## 2020-06-27 MED ORDER — FREESTYLE LIBRE 14 DAY READER DEVI: 0 refills | Status: DC

## 2020-06-27 NOTE — Telephone Encounter (Signed)
Of course. I also have some samples she could pick up at the office if she likes.

## 2020-06-27 NOTE — Telephone Encounter (Signed)
Freestyles sent to [employer].

## 2020-06-27 NOTE — Telephone Encounter (Signed)
Freestyle is not covered at the pharmacy.  Is it ok to send to diabetic supplier? Kyung Rudd, [place])

## 2020-07-02 ENCOUNTER — Telehealth: Payer: Self-pay | Admitting: Family Medicine

## 2020-07-02 DIAGNOSIS — Z860100 Personal history of colon polyps, unspecified: Secondary | ICD-10-CM | POA: Insufficient documentation

## 2020-07-02 DIAGNOSIS — B182 Chronic viral hepatitis C: Secondary | ICD-10-CM | POA: Insufficient documentation

## 2020-07-02 NOTE — Telephone Encounter (Signed)
Left message on machine for patient.  No information yet received from [facility] [facility].  Order has been faxed.

## 2020-07-02 NOTE — Telephone Encounter (Signed)
Spoke to the pt.  She said she was advised that her glucometer was sent to [facility].  She has spoke to [facility] and they are waiting on "something from the doctor."

## 2020-07-03 NOTE — Progress Notes (Signed)
Forms received from [facility] @[facility] for catheter supplies. Forms have been signed by Dr Wannetta Sender and faxed back. Confirmation received  Qty 180 cath 6x per day.

## 2020-07-04 ENCOUNTER — Telehealth: Payer: Self-pay | Admitting: Internal Medicine

## 2020-07-04 NOTE — Telephone Encounter (Signed)
Spoke with patient and number to Deborah Wise was given.

## 2020-07-04 NOTE — Telephone Encounter (Signed)
Pt call and stated she  Would like Apolonio Schneiders to give her a call back.

## 2020-07-08 ENCOUNTER — Telehealth: Payer: Self-pay | Admitting: Internal Medicine

## 2020-07-08 NOTE — Telephone Encounter (Signed)
   Deborah Wise DOB: 1950/10/05 MRN: [id number]   RIDER WAIVER AND RELEASE OF LIABILITY  For purposes of improving physical access to our facilities, [place] is pleased to partner with third parties to provide [place] patients or other authorized individuals the option of convenient, on-demand ground transportation services (the [place]") through use of the technology service that enables users to request on-demand ground transportation from independent third-party providers.  By opting to use and accept these [employer], I, the undersigned, hereby agree on behalf of myself, and on behalf of any minor child using the [employer] for whom I am the parent or legal guardian, as follows:  1. Government social research officer provided to me are provided by independent third-party transportation providers who are not [employer] or employees and who are unaffiliated with [employer]. 2. [place] is neither a transportation carrier nor a common or public carrier. 3. [facility] has no control over the quality or safety of the transportation that occurs as a result of the [employer]. 4. [place] cannot guarantee that any third-party transportation provider will complete any arranged transportation service. 5. [place] makes no representation, warranty, or guarantee regarding the reliability, timeliness, quality, safety, suitability, or availability of any of the Transport Services or that they will be error free. 6. I fully understand that traveling by vehicle involves risks and dangers of serious bodily injury, including permanent disability, paralysis, and death. I agree, on behalf of myself and on behalf of any minor child using the Transport Services for whom I am the parent or legal guardian, that the entire risk arising out of my use of the [employer] remains solely with me, to the maximum extent permitted under applicable law. 7. The [employer] are provided "as is" and "as available." [place] disclaims all representations and warranties, express, implied or statutory, not expressly set out in these terms, including the implied warranties of merchantability and fitness for a particular purpose. 8. I hereby waive and release [place], its agents, employees, officers, directors, representatives, insurers, attorneys, assigns, successors, subsidiaries, and affiliates from any and all past, present, or future claims, demands, liabilities, actions, causes of action, or suits of any kind directly or indirectly arising from acceptance and use of the [employer]. 9. I further waive and release [place] and its affiliates from all present and future liability and responsibility for any injury or death to persons or damages to property caused by or related to the use of the [employer]. 10. I have read this Waiver and Release of Liability, and I understand the terms used in it and their legal significance. This Waiver is freely and voluntarily given with the understanding that my right (as well as the right of any minor child for whom I am the parent or legal guardian using the [employer]) to legal recourse against  in connection with the [employer] is knowingly surrendered in return for use of these services.   I attest that I read the consent document to Deborah Wise, gave Ms. Piccini the opportunity to ask questions and answered the questions asked (if any). I affirm that Deborah Wise then provided consent for she's participation in this program.     Deborah Wise

## 2020-07-08 NOTE — Telephone Encounter (Signed)
Pt would like a call from rachel  

## 2020-07-09 ENCOUNTER — Other Ambulatory Visit: Payer: Self-pay

## 2020-07-09 ENCOUNTER — Telehealth: Payer: Self-pay | Admitting: Pharmacist

## 2020-07-09 ENCOUNTER — Ambulatory Visit: Payer: Medicare Other

## 2020-07-09 ENCOUNTER — Ambulatory Visit (HOSPITAL_COMMUNITY)
Admission: RE | Admit: 2020-07-09 | Discharge: 2020-07-09 | Disposition: A | Discharge status: Home / Self Care | Payer: Medicare Other | Source: Ambulatory Visit | Attending: Physician Assistant | Admitting: Physician Assistant

## 2020-07-09 DIAGNOSIS — I6523 Occlusion and stenosis of bilateral carotid arteries: Secondary | ICD-10-CM

## 2020-07-09 DIAGNOSIS — R3914 Feeling of incomplete bladder emptying: Secondary | ICD-10-CM | POA: Diagnosis not present

## 2020-07-09 NOTE — Chronic Care Management (AMB) (Signed)
Chronic Care Management Pharmacy Assistant   Name: Deborah Wise  MRN: [id number] DOB: September 22, 1950  Reason for Encounter: Medication Review  PCP : Deborah Wise, Deborah Halsted, MD  Allergies:  No Known Allergies  Medications: Outpatient Encounter Medications as of 07/09/2020  Medication Sig  . Accu-Chek FastClix Lancets MISC 1 each by Does not apply route daily.  . Accu-Chek Softclix Lancets lancets USE AS DIRECTED TO CHECK BLOOD SUGAR TWICE A DAY  . amLODipine (NORVASC) 10 MG tablet TAKE ONE TABLET BY MOUTH EVERY MORNING FOR blood pressure  . ASPIRIN LOW DOSE 81 MG EC tablet TAKE 1 TABLET BY MOUTH EVERY DAY (Patient taking differently: Take 81 mg by mouth daily.)  . Blood Glucose Monitoring Suppl (ACCU-CHEK GUIDE) w/Device KIT Please specify directions, refills and quantity  . Catheters (SELF-CATH SYSTEM STRAIGHT TIP) KIT 1 each by Does not apply route as needed.  . Cholecalciferol 25 MCG (1000 UT) capsule Take 1,000 Units by mouth daily.   . Continuous Blood Gluc Receiver (FREESTYLE LIBRE 14 DAY READER) DEVI Use daily for glucose control.  Dx E11.9  . Continuous Blood Gluc Sensor (FREESTYLE LIBRE 14 DAY SENSOR) MISC Use daily for glucose control.  Dx E11.9  . cyanocobalamin 1000 MCG tablet Take 1,000 mcg by mouth daily.   [name] Kitchen glucose blood (ACCU-CHEK GUIDE) test strip 1 each by Other route in the morning and at bedtime. Dx E11.9  . hydrochlorothiazide (HYDRODIURIL) 25 MG tablet TAKE ONE TABLET BY MOUTH EVERY MORNING FOR fluid/blood pressure  . meloxicam (MOBIC) 15 MG tablet Take 1 tablet (15 mg total) by mouth daily. (Patient taking differently: Take 15 mg by mouth as needed.)  . metFORMIN (GLUCOPHAGE) 500 MG tablet Take 1 tablet (500 mg total) by mouth daily with breakfast.  . oxyCODONE (OXY IR/ROXICODONE) 5 MG immediate release tablet Take by mouth.  [name] Kitchen REPATHA SURECLICK 761 MG/ML SOAJ INJECT 159m into THE SKIN every 14 DAYS for cholesterol  . triamcinolone (KENALOG) 0.1 % APPLY  TO THE AFFECTED AREA(S) TWICE DAILY FOR SKIN condition   Facility-Administered Encounter Medications as of 07/09/2020  Medication  . sodium chloride flush (NS) 0.9 % injection 3 mL    Current Diagnosis: Patient Active Problem List   Diagnosis Date Noted  . Chronic hepatitis C ([facility] 07/02/2020  . Personal history of colonic polyps 07/02/2020  . Fracture of metacarpal bone 05/15/2020  . Peripheral arterial disease (HCall[place] 04/15/2020  . Coronary artery disease   . Chronic diastolic CHF (congestive heart failure) (HOlive Hill 03/12/2020  . Dysuria 03/01/2020  . Chronic arthropathy 11/07/2019  . Hallux limitus of right foot 11/07/2019  . Diabetic neuropathy (HR[place] 04/04/2019  . Cirrhosis of liver (HCh[place] 06/22/2018  . Chronic hepatitis (HEmpor[place] 06/22/2018  . Hyperlipidemia associated with type 2 diabetes mellitus (HCedar Highlands 06/22/2018  . Carotid artery disease (HCal[place] 06/22/2018  . 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HFrank[place] 07/02/2016  . Aneurysm of middle cerebral artery   . 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 Cocaine abuse (HSouthmont   . Stroke (cerebrum) (HSat[place] 06/27/2016  . Microcytic anemia 12/27/2014  . Thrombocytopenia (HRock[place] 12/25/2014  . Hypoglycemia 07/21/2013  . Diabetes mellitus (HM[place] 07/21/2013  . 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 Liver disease 07/21/2013  . Alcohol abuse 07/21/2013    Goals Addressed   None    Reviewed chart for medication changes ahead of medication coordination call. Reviewed OVs, Consults, or hospital visits since last care coordination call/Pharmacist visit.  .[name] Kitchen02-07-2020 BHarold Barban  W, MD [facility] No medication changes indicated.  BP Readings from Last 3 Encounters:  06/10/20 (!) 191/91  05/22/20 122/82  05/05/20 140/82    Lab Results  Component Value Date   HGBA1C 5.5 02/20/2020     Patient obtains medications through Adherence Packaging  30 Days  Last adherence delivery  included:  [name] Kitchen Amlodipine 10 mg: one tablet at breakfast . Aspirin 81 mg: one tablet at breakfast . Vitamin D3 MCG 1000-unit: one capsule at breakfast . Vitamin B-12 Daily 1000 MCG: one tablet at breakfast . Repatha Sureclick 277 MG/ML Injection- Every 14 Days . Hydrochlorothiazide 25 mg: one tablet at breakfast . Metformin 500 mg: one tablet at breakfast . Triamcinolone cream (KENALOG) 0.1 %       Patient declined the following medication last month due to PRN use/additional supply on hand. [name] Kitchen ACCU- Check Guide test strip . ACCU- Check softclix lancets  Patient is due for next adherence delivery on:07/18/2020 . Called patient and reviewed medications and coordinated delivery. This delivery to include: . Amlodipine 10 mg: one tablet at breakfast . Aspirin 81 mg: one tablet at breakfast . Vitamin D3 MCG 1000-unit: one capsule at breakfast . Vitamin B-12 Daily 1000 MCG: one tablet at breakfast . Repatha Sureclick 412 MG/ML Injection- Every 14 Days . Hydrochlorothiazide 25 mg: one tablet at breakfast . Metformin 500 mg: one tablet at breakfast . Triamcinolone cream (KENALOG) 0.1 % . Oxycodone (OXY IR/ROXICODONE) 5 MG immediate release tablet  I spoke with the patient and review medications. There are no changes in medications currently. Patient declined these medication this month due to PRN use/additional supply on hand.The patient is taking the following medications: . ACCU- Check Guide test strip . ACCU- Check softclix lancets  She does not need refill at this time. Confirmed delivery date of 07/18/2020, advised patient that pharmacy will contact them the morning of delivery.  Follow-Up:  Coordination of Enhanced Pharmacy Services and Pharmacist Review  Maia Breslow, [place] Assistant [phone]

## 2020-07-09 NOTE — Telephone Encounter (Signed)
Spoke with patient.

## 2020-07-09 NOTE — Telephone Encounter (Signed)
Left message on machine for patient to return our call 

## 2020-07-09 NOTE — Telephone Encounter (Signed)
Pt returning your call she said you can call her back

## 2020-07-09 NOTE — Telephone Encounter (Signed)
Patient called back

## 2020-07-09 NOTE — Progress Notes (Deleted)
Office Note     CC:  follow up Requesting Provider:  Isaac Bliss, [place]*  HPI: Deborah Wise is a 70 y.o. (1950/11/02) female who presents for follow up of carotid artery stenosis. She has history of left brain stroke in 2018. At the time she had slurred speech and right sided weakness. Imaging showed right ICA stenosis, mild- moderate left ICA stenosis.   The pt is not on a statin for cholesterol management.  The pt is on a daily aspirin.   Other AC: None The pt is on CCB and HCTZ for hypertension.   The pt is diabetic.  Tobacco hx:  never  Past Medical History:  Diagnosis Date  . Anemia   . Arthritis   . Blood transfusion without reported diagnosis   . Carotid artery occlusion   . Cirrhosis ([place])   . Diabetes mellitus without complication ([place])   . Fibroid, uterine   . GERD (gastroesophageal reflux disease)   . Hepatitis    hep C  . Hypertension   . Stroke ([place])   . Thrombocytopenia ([place]) 12/25/2014  . Urinary incontinence    Patient noticed mild and is not currently a significant problem    Past Surgical History:  Procedure Laterality Date  . ANKLE FRACTURE SURGERY Right 1999   x 2, rod  . CESAREAN SECTION     w/ tubal ligation  . COLONOSCOPY    . IR GENERIC HISTORICAL  06/30/2016   IR ANGIO VERTEBRAL SEL SUBCLAVIAN INNOMINATE UNI R MOD SED 06/30/2016 [name], MD [facility]  . IR GENERIC HISTORICAL  06/30/2016   IR ANGIO VERTEBRAL SEL VERTEBRAL UNI L MOD SED 06/30/2016 [name], MD [facility]  . IR GENERIC HISTORICAL  06/30/2016   IR ANGIO INTRA EXTRACRAN SEL COM CAROTID INNOMINATE BILAT MOD SED 06/30/2016 [name], MD [facility]  . LEFT HEART CATH AND CORONARY ANGIOGRAPHY N/A 03/25/2020   Procedure: LEFT HEART CATH AND CORONARY ANGIOGRAPHY;  Surgeon: Jettie Booze, MD;  Location: [facility];  Service: Cardiovascular;  Laterality: N/A;  . MULTIPLE EXTRACTIONS WITH ALVEOLOPLASTY  10/25/2011   Procedure:  MULTIPLE EXTRACION WITH ALVEOLOPLASTY;  Surgeon: Gae Bon, DDS;  Location: [facility];  Service: Oral Surgery;  Laterality: Bilateral;  Extract teeth numbers one, two, six, seven, eight, nine, ten, eleven, twelve, fifteen, sixteen, eighteen, nineteen, twenty-three, twenty-four, twenty-five, twenty-seven, thirty-two and alveoplasty.    Social History   Socioeconomic History  . Marital status: Legally Separated    Spouse name: Not on file  . Number of children: 3  . Years of education: Not on file  . Highest education level: Not on file  Occupational History  . Occupation: disabled  Tobacco Use  . Smoking status: Never Smoker  . Smokeless tobacco: Never Used  Vaping Use  . Vaping Use: Never used  Substance and Sexual Activity  . Alcohol use: Yes    Alcohol/week: 1.0 standard drink    Types: 1 Cans of beer per week    Comment: 1/2 bottle of wine and some beer a day  . Drug use: Not Currently    Frequency: 1.0 times per week    Types: Cocaine    Comment: last use cocaine last year 2018  . Sexual activity: Yes    Partners: Male    Comment: married- 11 yrs   Other Topics Concern  . Not on file  Social History Narrative  . Not on file   Social Determinants of Health  Financial Resource Strain: Low Risk   . Difficulty of Paying Living Expenses: Not hard at all  Food Insecurity: Not on file  Transportation Needs: No Transportation Needs  . Lack of Transportation (Medical): No  . Lack of Transportation (Non-Medical): No  Physical Activity: Not on file  Stress: Not on file  Social Connections: Not on file  Intimate Partner Violence: Not on file   Family History  Problem Relation Age of Onset  . Diabetes type II Mother   . Hypertension Mother   . Stroke Mother   . Cancer Father        patient does not know what type of cancer  . Diabetes Daughter   . Breast cancer Neg Hx   . Colon polyps Neg Hx   . Crohn's disease Neg Hx   . Esophageal cancer Neg Hx   . Rectal cancer  Neg Hx   . Stomach cancer Neg Hx   . Colon cancer Neg Hx     Current Outpatient Medications  Medication Sig Dispense Refill  . Accu-Chek FastClix Lancets MISC 1 each by Does not apply route daily. 100 each 3  . Accu-Chek Softclix Lancets lancets USE AS DIRECTED TO CHECK BLOOD SUGAR TWICE A DAY 100 each 0  . amLODipine (NORVASC) 10 MG tablet TAKE ONE TABLET BY MOUTH EVERY MORNING FOR blood pressure 90 tablet 1  . ASPIRIN LOW DOSE 81 MG EC tablet TAKE 1 TABLET BY MOUTH EVERY DAY (Patient taking differently: Take 81 mg by mouth daily.) 90 tablet 1  . Blood Glucose Monitoring Suppl (ACCU-CHEK GUIDE) w/Device KIT Please specify directions, refills and quantity 1 kit 0  . Catheters (SELF-CATH SYSTEM STRAIGHT TIP) KIT 1 each by Does not apply route as needed. 120 kit 11  . Cholecalciferol 25 MCG (1000 UT) capsule Take 1,000 Units by mouth daily.     . Continuous Blood Gluc Receiver (FREESTYLE LIBRE 14 DAY READER) DEVI Use daily for glucose control.  Dx E11.9 1 each 0  . Continuous Blood Gluc Sensor (FREESTYLE LIBRE 14 DAY SENSOR) MISC Use daily for glucose control.  Dx E11.9 1 each 5  . cyanocobalamin 1000 MCG tablet Take 1,000 mcg by mouth daily.     . glucose blood (ACCU-CHEK GUIDE) test strip 1 each by Other route in the morning and at bedtime. Dx E11.9 100 strip 0  . hydrochlorothiazide (HYDRODIURIL) 25 MG tablet TAKE ONE TABLET BY MOUTH EVERY MORNING FOR fluid/blood pressure 90 tablet 1  . meloxicam (MOBIC) 15 MG tablet Take 1 tablet (15 mg total) by mouth daily. (Patient taking differently: Take 15 mg by mouth as needed.) 30 tablet 0  . metFORMIN (GLUCOPHAGE) 500 MG tablet Take 1 tablet (500 mg total) by mouth daily with breakfast.    . oxyCODONE (OXY IR/ROXICODONE) 5 MG immediate release tablet Take by mouth.    . REPATHA SURECLICK 140 MG/ML SOAJ INJECT 140mg into THE SKIN every 14 DAYS for cholesterol 2 mL 1  . triamcinolone (KENALOG) 0.1 % APPLY TO THE AFFECTED AREA(S) TWICE DAILY FOR SKIN  condition 80 g 4   Current Facility-Administered Medications  Medication Dose Route Frequency Provider Last Rate Last Admin  . sodium chloride flush (NS) 0.9 % injection 3 mL  3 mL Intravenous Q12H Skains, Mark C, MD        No Known Allergies   REVIEW OF SYSTEMS:  [X] denotes positive finding, [ ] denotes negative finding Cardiac  Comments:  Chest pain or chest pressure:      Shortness of breath upon exertion:    Short of breath when lying flat:    Irregular heart rhythm:        Vascular    Pain in calf, thigh, or hip brought on by ambulation:    Pain in feet at night that wakes you up from your sleep:     Blood clot in your veins:    Leg swelling:         Pulmonary    Oxygen at home:    Productive cough:     Wheezing:         Neurologic    Sudden weakness in arms or legs:     Sudden numbness in arms or legs:     Sudden onset of difficulty speaking or slurred speech:    Temporary loss of vision in one eye:     Problems with dizziness:         Gastrointestinal    Blood in stool:     Vomited blood:         Genitourinary    Burning when urinating:     Blood in urine:        Psychiatric    Major depression:         Hematologic    Bleeding problems:    Problems with blood clotting too easily:        Skin    Rashes or ulcers:        Constitutional    Fever or chills:      PHYSICAL EXAMINATION:  There were no vitals filed for this visit.  General:  WDWN in NAD; vital signs documented above Gait: Not observed HENT: WNL, normocephalic Pulmonary: normal non-labored breathing , without Rales, rhonchi,  wheezing Cardiac: {Desc; regular/irreg:14544} HR, without  Murmurs {With/Without:20273} carotid bruit*** Abdomen: soft, NT, no masses Skin: {With/Without:20273} rashes Vascular Exam/Pulses:  Right Left  Radial {Exam; arterial pulse strength 0-4:30167} {Exam; arterial pulse strength 0-4:30167}  Ulnar {Exam; arterial pulse strength 0-4:30167} {Exam; arterial  pulse strength 0-4:30167}  Femoral {Exam; arterial pulse strength 0-4:30167} {Exam; arterial pulse strength 0-4:30167}  Popliteal {Exam; arterial pulse strength 0-4:30167} {Exam; arterial pulse strength 0-4:30167}  DP {Exam; arterial pulse strength 0-4:30167} {Exam; arterial pulse strength 0-4:30167}  PT {Exam; arterial pulse strength 0-4:30167} {Exam; arterial pulse strength 0-4:30167}   Extremities: {With/Without:20273} ischemic changes, {With/Without:20273} Gangrene , {With/Without:20273} cellulitis; {With/Without:20273} open wounds;  Musculoskeletal: no muscle wasting or atrophy  Neurologic: A&O X 3;  No focal weakness or paresthesias are detected Psychiatric:  The pt has {Desc; normal/abnormal:11317::"Normal"} affect.   Non-Invasive Vascular Imaging:   Summary:  Right Carotid: Velocities in the right ICA are consistent with a 60-79% stenosis.   Left Carotid: Velocities in the left ICA are consistent with a 40-59% stenosis.   Vertebrals: Bilateral vertebral arteries demonstrate antegrade flow.  Subclavians: Normal flow hemodynamics were seen in bilateral subclavian arteries.     ASSESSMENT/PLAN:: 69 y.o. female here for follow up for carotid artery stenosis.  Her duplex today shows stable 60-79% on the right ICA and 40-59% on the left. Velocities are essentially unchanged from her prior study. - reviewed signs and symptoms of TIA/ Stroke with her and she knows to present to ER should these occur - She will follow up in 6 months with repeat carotid duplex   Deborah Baglia, PA-C [facility] [phone]  Clinic MD:   On call MD Dr. Brabham 

## 2020-07-10 ENCOUNTER — Ambulatory Visit: Payer: Medicare Other | Admitting: Urology

## 2020-07-15 ENCOUNTER — Telehealth: Payer: Self-pay | Admitting: *Deleted

## 2020-07-15 NOTE — Telephone Encounter (Signed)
    [facility] Pre-operative Risk Assessment    Request for surgical clearance:  1. What type of surgery is being performed? Dental extractions with alveoloplasty   2. When is this surgery scheduled? TBD   3. What type of clearance is required (medical clearance vs. Pharmacy clearance to hold med vs. Both)? Medical   4. Are there any medications that need to be held prior to surgery and how long? N/A   5. Practice name and name of physician performing surgery? Dr. Diona Browner DMD, PA, [facility]   6. What is the office phone number? [phone]   7.   What is the office fax number? [phone]  8.   Anesthesia type (None, local, MAC, general) ? general   Deborah Wise A Deborah Wise 07/15/2020, 4:31 PM  _________________________________________________________________

## 2020-07-16 ENCOUNTER — Ambulatory Visit (INDEPENDENT_AMBULATORY_CARE_PROVIDER_SITE_OTHER): Payer: Medicare Other | Admitting: Physician Assistant

## 2020-07-16 ENCOUNTER — Other Ambulatory Visit: Payer: Self-pay

## 2020-07-16 VITALS — BP 200/89 | HR 71 | Temp 97.9°F | Resp 20 | Ht 65.0 in | Wt 176.2 lb

## 2020-07-16 DIAGNOSIS — Z8673 Personal history of transient ischemic attack (TIA), and cerebral infarction without residual deficits: Secondary | ICD-10-CM

## 2020-07-16 DIAGNOSIS — I6523 Occlusion and stenosis of bilateral carotid arteries: Secondary | ICD-10-CM

## 2020-07-16 NOTE — Telephone Encounter (Signed)
   Primary Cardiologist: No primary care provider on file.  Chart reviewed and patient contacted today by phone as part of pre-operative protocol coverage. Given past medical history and time since last visit, based on ACC/AHA guidelines, Deborah Wise would be at acceptable risk for the planned procedure without further cardiovascular testing.   Okay to hold aspirin 3 to 5 days preop.  The patient does not require SBE prophylaxis.  The patient was advised that if she develops new symptoms prior to surgery to contact our office to arrange for a follow-up visit, and she verbalized understanding.  I will route this recommendation to the requesting party via Epic fax function and remove from pre-op pool.  Please call with questions.  Kerin Ransom, PA-C 07/16/2020, 9:03 AM

## 2020-07-16 NOTE — Progress Notes (Signed)
Established Carotid Patient   History of Present Illness   Deborah Wise is a 70 y.o. (1950/11/08) female who presents to go over vascular studies related to carotid artery stenosis.  She has had a history of a left brain stroke based on MRI.  Surgical history also significant for cerebral angiogram in January 2018 demonstrating 60 to 65% right ICA stenosis with no intervention.  She denies any strokelike symptoms including slurring speech, changes in vision, or one-sided weakness.  She is taking an aspirin daily.  She denies tobacco use.  The patient's PMH, PSH, SH, and FamHx were reviewed and are unchanged from prior visit.  Current Outpatient Medications  Medication Sig Dispense Refill  . Accu-Chek FastClix Lancets MISC 1 each by Does not apply route daily. 100 each 3  . Accu-Chek Softclix Lancets lancets USE AS DIRECTED TO CHECK BLOOD SUGAR TWICE A DAY 100 each 0  . amLODipine (NORVASC) 10 MG tablet TAKE ONE TABLET BY MOUTH EVERY MORNING FOR blood pressure 90 tablet 1  . ASPIRIN LOW DOSE 81 MG EC tablet TAKE 1 TABLET BY MOUTH EVERY DAY (Patient taking differently: Take 81 mg by mouth daily.) 90 tablet 1  . Blood Glucose Monitoring Suppl (ACCU-CHEK GUIDE) w/Device KIT Please specify directions, refills and quantity 1 kit 0  . Catheters (SELF-CATH SYSTEM STRAIGHT TIP) KIT 1 each by Does not apply route as needed. 120 kit 11  . Cholecalciferol 25 MCG (1000 UT) capsule Take 1,000 Units by mouth daily.     . Continuous Blood Gluc Receiver (FREESTYLE LIBRE 14 DAY READER) DEVI Use daily for glucose control.  Dx E11.9 1 each 0  . Continuous Blood Gluc Sensor (FREESTYLE LIBRE 14 DAY SENSOR) MISC Use daily for glucose control.  Dx E11.9 1 each 5  . cyanocobalamin 1000 MCG tablet Take 1,000 mcg by mouth daily.     [name] Kitchen glucose blood (ACCU-CHEK GUIDE) test strip 1 each by Other route in the morning and at bedtime. Dx E11.9 100 strip 0  . hydrochlorothiazide (HYDRODIURIL) 25 MG tablet TAKE ONE  TABLET BY MOUTH EVERY MORNING FOR fluid/blood pressure 90 tablet 1  . meloxicam (MOBIC) 15 MG tablet Take 1 tablet (15 mg total) by mouth daily. (Patient taking differently: Take 15 mg by mouth as needed.) 30 tablet 0  . metFORMIN (GLUCOPHAGE) 500 MG tablet Take 1 tablet (500 mg total) by mouth daily with breakfast.    . oxyCODONE (OXY IR/ROXICODONE) 5 MG immediate release tablet Take by mouth.    [name] Kitchen REPATHA SURECLICK 992 MG/ML SOAJ INJECT 131m into THE SKIN every 14 DAYS for cholesterol 2 mL 1  . triamcinolone (KENALOG) 0.1 % APPLY TO THE AFFECTED AREA(S) TWICE DAILY FOR SKIN condition 80 g 4   Current Facility-Administered Medications  Medication Dose Route Frequency Provider Last Rate Last Admin  . sodium chloride flush (NS) 0.9 % injection 3 mL  3 mL Intravenous Q12H Skains, Mark C, MD        REVIEW OF SYSTEMS (negative unless checked):   Cardiac:  []  Chest pain or chest pressure? []  Shortness of breath upon activity? []  Shortness of breath when lying flat? []  Irregular heart rhythm?  Vascular:  []  Pain in calf, thigh, or hip brought on by walking? []  Pain in feet at night that wakes you up from your sleep? []  Blood clot in your veins? []  Leg swelling?  Pulmonary:  []  Oxygen at home? []  Productive cough? []  Wheezing?  Neurologic:  []  Sudden weakness in  arms or legs? []  Sudden numbness in arms or legs? []  Sudden onset of difficult speaking or slurred speech? []  Temporary loss of vision in one eye? []  Problems with dizziness?  Gastrointestinal:  []  Blood in stool? []  Vomited blood?  Genitourinary:  []  Burning when urinating? []  Blood in urine?  Psychiatric:  []  Major depression  Hematologic:  []  Bleeding problems? []  Problems with blood clotting?  Dermatologic:  []  Rashes or ulcers?  Constitutional:  []  Fever or chills?  Ear/Nose/Throat:  []  Change in hearing? []  Nose bleeds? []  Sore throat?  Musculoskeletal:  []  Back pain? []  Joint pain? []  Muscle  pain?   Physical Examination   Vitals:   07/16/20 0917 07/16/20 0920  BP: (!) 199/78 (!) 200/89  Pulse: 71   Resp: 20   Temp: 97.9 F (36.6 C)   TempSrc: Temporal   SpO2: 99%   Weight: 176 lb 3.2 oz (79.9 kg)   Height: 5' 5"  (1.651 m)    Body mass index is 29.32 kg/m.  General:  WDWN in NAD; vital signs documented above Gait: Not observed HENT: WNL, normocephalic Pulmonary: normal non-labored breathing Cardiac: regular HR Abdomen: soft, NT, no masses Skin: without rashes Vascular Exam/Pulses:  Right Left  Radial 2+ (normal) 2+ (normal)  Ulnar absent absent   Extremities: without ischemic changes, without Gangrene , without cellulitis; without open wounds;  Musculoskeletal: no muscle wasting or atrophy  Neurologic: A&O X 3;  No focal weakness or paresthesias are detected Psychiatric:  The pt has Normal affect.  Non-Invasive Vascular Imaging   B Carotid Duplex :   R ICA stenosis:  60-79%  R VA:  patent and antegrade  L ICA stenosis:  40-59%  L VA:  patent and antegrade   Medical Decision Making   Deborah Wise is a 70 y.o. female who presents with: asymptomatic bilateral ICA stenosis   Carotid duplex unchanged over the past 6 months demonstrating right ICA 60 to 79% stenosis and left ICA 40 to 50% stenosis  No indication for revascularization of right ICA asymptomatic stenosis currently  Continue aspirin daily  Recheck carotid duplex in 6 months per protocol   Deborah Ligas PA-C [facility] Office: [phone]  Clinic MD: Oneida Alar

## 2020-07-17 ENCOUNTER — Ambulatory Visit (INDEPENDENT_AMBULATORY_CARE_PROVIDER_SITE_OTHER): Payer: Medicare Other | Admitting: Internal Medicine

## 2020-07-17 ENCOUNTER — Other Ambulatory Visit: Payer: Self-pay | Admitting: *Deleted

## 2020-07-17 ENCOUNTER — Encounter: Payer: Self-pay | Admitting: Internal Medicine

## 2020-07-17 VITALS — BP 140/80 | HR 87 | Temp 98.4°F | Wt 176.3 lb

## 2020-07-17 DIAGNOSIS — I739 Peripheral vascular disease, unspecified: Secondary | ICD-10-CM | POA: Diagnosis not present

## 2020-07-17 DIAGNOSIS — I63012 Cerebral infarction due to thrombosis of left vertebral artery: Secondary | ICD-10-CM | POA: Diagnosis not present

## 2020-07-17 DIAGNOSIS — E1149 Type 2 diabetes mellitus with other diabetic neurological complication: Secondary | ICD-10-CM | POA: Diagnosis not present

## 2020-07-17 DIAGNOSIS — E785 Hyperlipidemia, unspecified: Secondary | ICD-10-CM

## 2020-07-17 DIAGNOSIS — I6523 Occlusion and stenosis of bilateral carotid arteries: Secondary | ICD-10-CM

## 2020-07-17 DIAGNOSIS — E1169 Type 2 diabetes mellitus with other specified complication: Secondary | ICD-10-CM | POA: Diagnosis not present

## 2020-07-17 DIAGNOSIS — I2510 Atherosclerotic heart disease of native coronary artery without angina pectoris: Secondary | ICD-10-CM | POA: Diagnosis not present

## 2020-07-17 LAB — POCT GLYCOSYLATED HEMOGLOBIN (HGB A1C): Hemoglobin A1C: 6.1 % — AB (ref 4.0–5.6)

## 2020-07-17 MED ORDER — FREESTYLE LIBRE 2 READER DEVI: 1.0000 | Freq: Every day | 11 refills | Status: DC

## 2020-07-17 MED ORDER — FREESTYLE LIBRE 2 SENSOR MISC: 1.0000 | Freq: Every day | 11 refills | Status: DC

## 2020-07-17 NOTE — Patient Instructions (Signed)
-  Nice seeing you today!!  -Schedule follow up in 3-4 months. 

## 2020-07-17 NOTE — Progress Notes (Signed)
[facility] Return Visit  SUBJECTIVE  History of Present Illness: Deborah Wise is a 70 y.o. female seen in follow-up for incomplete bladder emptying.   She underwent video urodynamic testing at [facility] which showed:  - Capacity 866ml - Decreased sensation with first sensation at 364ml - No SUI - Detrusor instability noted - High voiding detrusor pressures, max pressure was 132cm H20. No ureteral reflux noted. - bladder outlet obstruction on fluoroscopy, elevated PVR of 469ml - Intermittent increase in EMG leads with voiding  She has been doing CIC 3 times per day- feels that she is comfortable with it. Attempts to void prior to cathing but does not have much come out. Has been receiving cath supplies from [facility].  Has still been having urinary leakage throughout the day.   Also reports that she has had low sexual desire and vaginal dryness.    Past Medical History: Patient  has a past medical history of Anemia, Arthritis, Blood transfusion without reported diagnosis, Carotid artery occlusion, Cirrhosis ([place]), Diabetes mellitus without complication ([place]), Fibroid, uterine, GERD (gastroesophageal reflux disease), Hepatitis, Hypertension, Stroke ([place]), Thrombocytopenia ([place]) (12/25/2014), and Urinary incontinence.   Past Surgical History: She  has a past surgical history that includes Multiple extractions with alveoloplasty (10/25/2011); ir generic historical (06/30/2016); ir generic historical (06/30/2016); ir generic historical (06/30/2016); Ankle fracture surgery (Right, 1999); Colonoscopy; LEFT HEART CATH AND CORONARY ANGIOGRAPHY (N/A, 03/25/2020); and Cesarean section.   Medications: She has a current medication list which includes the following prescription(s): accu-chek fastclix lancets, accu-chek softclix lancets, amlodipine, aspirin low dose, accu-chek guide, self-cath system straight tip, cholecalciferol, freestyle libre 2 reader, freestyle libre 2  sensor, cyanocobalamin, [START ON 07/24/2020] estradiol, accu-chek guide, hydrochlorothiazide, meloxicam, metformin, oxycodone, repatha sureclick, tamsulosin, triamcinolone, and trospium chloride, and the following Facility-Administered Medications: sodium chloride flush.   Allergies: Patient has No Known Allergies.   Social History: Patient  reports that she has never smoked. She has never used smokeless tobacco. She reports current alcohol use of about 1.0 standard drink of alcohol per week. She reports previous drug use. Frequency: 1.00 time per week. Drug: Cocaine.      OBJECTIVE     Physical Exam: Vitals:   07/23/20 0850  BP: (!) 188/82  Pulse: 84  Weight: 174 lb (78.9 kg)  Height: 5\' 5"  (3.818 m)   Gen: No apparent distress, A&O x 3.  Detailed Urogynecologic Evaluation:  Deferred.    ASSESSMENT AND PLAN    [name] is a 70 y.o. with:  1. Bladder outlet obstruction   2. Detrusor overactivity   3. Vaginal atrophy     1. BOO/ Detrusor overactivity/ elevated detrusor pressure - Does not have any evidence of significant prolapse on prior exam that would be obstructive. Has enlarged fibroid uterus but fluoroscopy images suggest bladder neck/ urethral obstruction rather than external obstructive process. 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  - Continue to self catheterize at least 4 times daily- on waking, at lunch, dinner and before bed, or as needed. Patient expressed understanding that  - Will also start Flomax 0.4mg  daily to see if this improves her outlet obstruction/ emptying.  - elevated detrusor pressures with voiding- no reflux noted but will obtain renal US to ensure no hydronephrosis. Recent Cr on 03/18/20 was 0.83. Will also start Trospium 60mg  daily to reduce urgency and detrusor pressures.  - We also discussed that if Trospium is not covered then we can consider doing bladder botox to help with  symptoms and reduce bladder pressures.  - Renal US previously  ordered to assess for hydronephrosis. Patient given phone number to schedule appointment. She should have this done prior to next appointment.   2. Vaginal atrophy - Will start estrace cream 0.5mg  nightly for two weeks then twice a week after. Discussed that systemic hormonal estrogen would not be indicated for her due to her history of CVA/ heart disease.   Return 6 weeks  [name], MD  Time spent: I spent 25 minutes dedicated to the care of this patient on the date of this encounter to include pre-visit review of records, face-to-face time with the patient and post visit documentation and ordering medication/ testing.

## 2020-07-17 NOTE — Progress Notes (Signed)
Established Patient Office Visit     This visit occurred during the SARS-CoV-2 public health emergency.  Safety protocols were in place, including screening questions prior to the visit, additional usage of staff PPE, and extensive cleaning of exam room while observing appropriate contact time as indicated for disinfecting solutions.    CC/Reason for Visit: 36-monthfollow-up chronic medical conditions  HPI: Deborah OKONSKIis a 70y.o. female who is coming in today for the above mentioned reasons. Past Medical History is significant for: Coronary artery disease followed by cardiology with a recent catheterization in October 2021 that showed a proximal RCA occlusion of 75%, distal RCA 50% and mid LAD 60%.  Medical management was recommended.  She has a history of carotid artery disease and follows on a yearly basis with vascular surgery. Was seen by them recently and 6 month follow up [place] recommended due to stability. 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  She has some mild thrombocytopenia due to splenomegaly from this. She also has PAD.  She is trying to get approved for freestyle libre for continuous glucose monitoring.   Past Medical/Surgical History: Past Medical History:  Diagnosis Date  . Anemia   . Arthritis   . Blood transfusion without reported diagnosis   . Carotid artery occlusion   . Cirrhosis ([place]   . Diabetes mellitus without complication ([place]   . Fibroid, uterine   . GERD (gastroesophageal reflux disease)   . Hepatitis    hep C  . Hypertension   . Stroke ([place]   . Thrombocytopenia ([place] 12/25/2014  . Urinary incontinence    Patient noticed mild and is not  currently a significant problem    Past Surgical History:  Procedure Laterality Date  . ANKLE FRACTURE SURGERY Right 1999   x 2, rod  . CESAREAN SECTION     w/ tubal ligation  . COLONOSCOPY    . IR GENERIC HISTORICAL  06/30/2016   IR ANGIO VERTEBRAL SEL SUBCLAVIAN INNOMINATE UNI R MOD SED 06/30/2016 SLuanne Bras MD [facility]  . IR GENERIC HISTORICAL  06/30/2016   IR ANGIO VERTEBRAL SEL VERTEBRAL UNI L MOD SED 06/30/2016 SLuanne Bras MD [facility]  . IR GENERIC HISTORICAL  06/30/2016   IR ANGIO INTRA EXTRACRAN SEL COM CAROTID INNOMINATE BILAT MOD SED 06/30/2016 SLuanne Bras MD [facility]  . LEFT HEART CATH AND CORONARY ANGIOGRAPHY N/A 03/25/2020   Procedure: LEFT HEART CATH AND CORONARY ANGIOGRAPHY;  Surgeon: VJettie Booze MD;  [facility];  Service: Cardiovascular;  Laterality: N/A;  . MULTIPLE EXTRACTIONS WITH ALVEOLOPLASTY  10/25/2011   Procedure: MULTIPLE EXTRACION WITH ALVEOLOPLASTY;  Surgeon: SGae Bon DDS;  [facility]  Service: Oral Surgery;  Laterality: Bilateral;  Extract teeth numbers one, two, six, seven, eight, nine, ten, eleven, twelve, fifteen, sixteen, eighteen, nineteen, twenty-three, twenty-four, twenty-five, twenty-seven, thirty-two and alveoplasty.    Social History:  reports that she has never smoked. She has never used smokeless tobacco. She reports current alcohol use of about 1.0 standard drink of alcohol per week. She reports previous drug use. Frequency: 1.00 time per week. Drug: Cocaine.  Allergies: No Known Allergies  Family History:  Family History  Problem Relation Age of Onset  . Diabetes type II Mother   . Hypertension Mother   . Stroke Mother   . Cancer Father        patient does not know what type of cancer  . Diabetes Daughter   . Breast cancer Neg Hx   . Colon polyps Neg Hx   . Crohn's disease Neg Hx   . Esophageal cancer Neg Hx   . Rectal cancer Neg Hx   . Stomach cancer Neg Hx   .  Colon cancer Neg Hx      Current Outpatient Medications:  .  Accu-Chek FastClix Lancets MISC, 1 each by Does not apply route daily., Disp: 100 each, Rfl: 3 .  Accu-Chek Softclix Lancets lancets, USE AS DIRECTED TO CHECK BLOOD SUGAR TWICE A DAY, Disp: 100 each, Rfl: 0 .  amLODipine (NORVASC) 10 MG tablet, TAKE ONE TABLET BY MOUTH EVERY MORNING FOR blood pressure, Disp: 90 tablet, Rfl: 1 .  ASPIRIN LOW DOSE 81 MG EC tablet, TAKE 1 TABLET BY MOUTH EVERY DAY (Patient taking differently: Take 81 mg by mouth daily.), Disp: 90 tablet, Rfl: 1 .  Blood Glucose Monitoring Suppl (ACCU-CHEK GUIDE) w/Device KIT, Please specify directions, refills and quantity, Disp: 1 kit, Rfl: 0 .  Catheters (SELF-CATH SYSTEM STRAIGHT TIP) KIT, 1 each by Does not apply route as needed., Disp: 120 kit, Rfl: 11 .  Cholecalciferol 25 MCG (1000 UT) capsule, Take 1,000 Units by mouth daily. , Disp: , Rfl:  .  Continuous Blood Gluc Receiver (FREESTYLE LIBRE 14 DAY READER) DEVI, Use daily for glucose control.  Dx E11.9, Disp: 1 each, Rfl: 0 .  Continuous Blood Gluc Sensor (FREESTYLE LIBRE 14 DAY SENSOR) MISC, Use daily for glucose control.  Dx E11.9, Disp: 1 each, Rfl: 5 .  cyanocobalamin 1000 MCG tablet, Take 1,000 mcg by mouth daily. , Disp: , Rfl:  .  glucose blood (ACCU-CHEK GUIDE) test strip, 1 each by Other route in the morning and at bedtime. Dx E11.9, Disp: 100 strip, Rfl: 0 .  hydrochlorothiazide (HYDRODIURIL) 25 MG tablet, TAKE ONE TABLET BY MOUTH EVERY MORNING FOR fluid/blood pressure, Disp: 90 tablet, Rfl: 1 .  meloxicam (MOBIC) 15 MG tablet, Take 1 tablet (15 mg total) by mouth daily. (Patient taking differently: Take 15 mg by mouth as needed.), Disp: 30 tablet, Rfl: 0 .  metFORMIN (GLUCOPHAGE) 500 MG tablet, Take 1 tablet (500 mg total) by mouth daily with breakfast., Disp: , Rfl:  .  oxyCODONE (OXY IR/ROXICODONE) 5 MG immediate release tablet, Take by mouth., Disp: , Rfl:  .  REPATHA SURECLICK 381 MG/ML SOAJ, INJECT  156m into THE SKIN every 14 DAYS for cholesterol, Disp: 2 mL, Rfl: 1 .  triamcinolone (KENALOG) 0.1 %, APPLY TO THE AFFECTED AREA(S) TWICE DAILY FOR SKIN condition, Disp: 80 g, Rfl: 4  Current Facility-Administered Medications:  .  sodium chloride flush (NS) 0.9 % injection 3 mL, 3 mL, Intravenous, Q12H, Skains, Mark C, MD  Review of Systems:  Constitutional: Denies fever, chills, diaphoresis, appetite change and fatigue.  HEENT: Denies photophobia, eye pain, redness, hearing loss, ear pain, congestion, sore throat, rhinorrhea, sneezing, mouth sores, trouble swallowing, neck pain, neck stiffness and tinnitus.   Respiratory: Denies SOB, DOE, cough, chest tightness,  and wheezing.   Cardiovascular: Denies chest pain, palpitations and leg swelling.  Gastrointestinal: Denies nausea, vomiting, abdominal pain, diarrhea, constipation, blood in stool and abdominal distention.  Genitourinary: Denies dysuria, urgency, frequency,  hematuria, flank pain and difficulty urinating.  Endocrine: Denies: hot or cold intolerance, sweats, changes in hair or nails, polyuria, polydipsia. Musculoskeletal: Denies myalgias, back pain, joint swelling, arthralgias and gait problem.  Skin: Denies pallor, rash and wound.  Neurological: Denies dizziness, seizures, syncope, weakness, light-headedness, numbness and headaches.  Hematological: Denies adenopathy. Easy bruising, personal or family bleeding history  Psychiatric/Behavioral: Denies suicidal ideation, mood changes, confusion, nervousness, sleep disturbance and agitation    Physical Exam: Vitals:   07/17/20 1411  BP: 140/80  Pulse: 87  Temp: 98.4 F (36.9 C)  TempSrc: Oral  SpO2: 98%  Weight: 176 lb 4.8 oz (80 kg)    Body mass index is 29.34 kg/m.   Constitutional: NAD, calm, comfortable Eyes: PERRL, lids and conjunctivae normal ENMT: Mucous membranes are moist.  Respiratory: clear to auscultation bilaterally, no wheezing, no crackles. Normal  respiratory effort. No accessory muscle use.  Cardiovascular: Regular rate and rhythm, no murmurs / rubs / gallops. No extremity edema.   Neurologic: Grossly intact and nonfocal Psychiatric: Normal judgment and insight. Alert and oriented x 3. Normal mood.    Impression and Plan:  Type 2 diabetes mellitus with other neurologic complication, without long-term current use of insulin (HCC)  -Well-controlled, A1c today 6.1.  I do believe a continuous glucose monitor would be good for her, will order.  Coronary artery disease involving native coronary artery of native heart without angina pectoris Bilateral carotid artery stenosis Cerebrovascular accident (CVA) due to thrombosis of left vertebral artery (HCC) Hyperlipidemia associated with type 2 diabetes mellitus ([place]) Peripheral arterial disease ([place]) -Followed by cardiology and vascular surgery.    Patient Instructions  -Nice seeing you today!!  -Schedule follow up in 3-4 months.     [name], MD [facility]

## 2020-07-18 ENCOUNTER — Other Ambulatory Visit: Payer: Self-pay

## 2020-07-18 ENCOUNTER — Other Ambulatory Visit: Payer: Self-pay | Admitting: Nurse Practitioner

## 2020-07-18 DIAGNOSIS — I6523 Occlusion and stenosis of bilateral carotid arteries: Secondary | ICD-10-CM

## 2020-07-18 DIAGNOSIS — K7460 Unspecified cirrhosis of liver: Secondary | ICD-10-CM

## 2020-07-18 DIAGNOSIS — I8500 Esophageal varices without bleeding: Secondary | ICD-10-CM | POA: Diagnosis not present

## 2020-07-18 DIAGNOSIS — K7469 Other cirrhosis of liver: Secondary | ICD-10-CM | POA: Diagnosis not present

## 2020-07-18 NOTE — Telephone Encounter (Signed)
Note from [place].  Patient does not qualify for Elenor Legato because she does not use insuline.   Patient is aware.

## 2020-07-21 DIAGNOSIS — R339 Retention of urine, unspecified: Secondary | ICD-10-CM | POA: Diagnosis not present

## 2020-07-23 ENCOUNTER — Other Ambulatory Visit: Payer: Self-pay

## 2020-07-23 ENCOUNTER — Ambulatory Visit (INDEPENDENT_AMBULATORY_CARE_PROVIDER_SITE_OTHER): Payer: Medicare Other | Admitting: Obstetrics and Gynecology

## 2020-07-23 ENCOUNTER — Encounter: Payer: Self-pay | Admitting: Obstetrics and Gynecology

## 2020-07-23 VITALS — BP 188/82 | HR 84 | Ht 65.0 in | Wt 174.0 lb

## 2020-07-23 DIAGNOSIS — N3281 Overactive bladder: Secondary | ICD-10-CM

## 2020-07-23 DIAGNOSIS — N952 Postmenopausal atrophic vaginitis: Secondary | ICD-10-CM | POA: Diagnosis not present

## 2020-07-23 DIAGNOSIS — N320 Bladder-neck obstruction: Secondary | ICD-10-CM | POA: Diagnosis not present

## 2020-07-23 MED ORDER — TAMSULOSIN HCL 0.4 MG PO CAPS: 0.4000 mg | ORAL_CAPSULE | Freq: Every day | ORAL | 5 refills | Status: DC

## 2020-07-23 MED ORDER — TROSPIUM CHLORIDE ER 60 MG PO CP24
1.0000 | ORAL_CAPSULE | Freq: Every day | ORAL | 5 refills | Status: DC
Start: 2020-07-23 — End: 2021-04-01

## 2020-07-23 MED ORDER — ESTRADIOL 0.1 MG/GM VA CREA: 0.5000 g | TOPICAL_CREAM | VAGINAL | 11 refills | Status: DC

## 2020-07-23 NOTE — Patient Instructions (Signed)
We have started Trospium 60mg  daily for overactive bladder symptoms.   Also will start Flomax 0.4g daily to see if this helps bladder emptying.   Start vaginal estrogen therapy nightly for two weeks then 2 times weekly at night for treatment of vaginal atrophy (dryness of the vaginal tissues).  Just place a pea size amount on the finger tip and insert into vagina. Please let us know if the prescription is too expensive and we can look for alternative options.

## 2020-07-24 ENCOUNTER — Telehealth: Payer: Self-pay | Admitting: Pharmacist

## 2020-07-24 NOTE — Progress Notes (Unsigned)
PA attempt made for Trospium Chloride ER 60MG  er capsules. PA- PENDING Key: [id number]  PA Case ID: [id number]  Rx #: [id number]

## 2020-07-24 NOTE — Chronic Care Management (AMB) (Signed)
07/24/2020- [facility] received new prescriptions for patient, Estradiol cream 0.01%, Tamsolusin 0.4 mg and Trospium Chl 60 mg ER. Trospium Chl 60 mg ER needs prior authorization. Called patient regarding new prescriptions received and verified she would like these medications sent today. Coordinated fill for Estradiol cream and Tamsolusin to be delivered today 07/24/2020. Patient aware the pharmacy will call prior to delivery and that the Trospium will not be delivered until prior authorization has been completed by Dr. Tommas Olp office. Patient understand. Called Dr. Wannetta Sender office, no answer, left voicemail regarding patients prior authorization needs for Trospium for [facility] to fill and deliver. Left message to return call also. Pharmacy notified, Jeni Salles, CPP notifed.  Pattricia Boss, [place] Pharmacist Assistant [phone]

## 2020-07-25 ENCOUNTER — Encounter: Payer: Self-pay | Admitting: Obstetrics and Gynecology

## 2020-07-29 ENCOUNTER — Telehealth: Payer: Self-pay | Admitting: Internal Medicine

## 2020-07-29 NOTE — Telephone Encounter (Signed)
Deborah Wise is calling back from Dr. Diona Browner office and they faxed over medical clearance for patient to have a dental extraction in a hospital setting, please advise. CB is [phone]

## 2020-07-30 NOTE — Telephone Encounter (Signed)
Left message on machine for Dr Lupita Leash office and for the patient.  We have not yet receive the form.

## 2020-08-03 ENCOUNTER — Other Ambulatory Visit: Payer: Self-pay | Admitting: Adult Health

## 2020-08-05 ENCOUNTER — Telehealth: Payer: Self-pay | Admitting: *Deleted

## 2020-08-05 ENCOUNTER — Ambulatory Visit
Admission: RE | Admit: 2020-08-05 | Discharge: 2020-08-05 | Disposition: A | Discharge status: Home / Self Care | Payer: Medicare Other | Source: Ambulatory Visit | Attending: Nurse Practitioner | Admitting: Nurse Practitioner

## 2020-08-05 DIAGNOSIS — K7460 Unspecified cirrhosis of liver: Secondary | ICD-10-CM | POA: Diagnosis not present

## 2020-08-05 NOTE — Telephone Encounter (Signed)
DOB verified. Informed pt that her insurance approved the medication that Dr Wannetta Sender had prescribed. Pt was thankful.  Advised she could call the pharmacy and pick it up at her convenience.

## 2020-08-06 ENCOUNTER — Telehealth: Payer: Self-pay | Admitting: Pharmacist

## 2020-08-06 ENCOUNTER — Other Ambulatory Visit: Payer: Self-pay | Admitting: Physician Assistant

## 2020-08-06 DIAGNOSIS — Z0000 Encounter for general adult medical examination without abnormal findings: Secondary | ICD-10-CM

## 2020-08-06 NOTE — Chronic Care Management (AMB) (Signed)
Chronic Care Management Pharmacy Assistant   Name: Deborah Wise  MRN: [id number] DOB: 1950/12/30  Reason for Encounter: Medication Review Patient Questions: 1.  Have you seen any other providers since your last visit?  o 02.16.22 Consult visit [name], MD-Obstetrics and Gynecology o 02.11.22 Consult Visit Beverley Fiedler, dawn A, NP -Transplant Hepatology o 02.10.22 Office Visit Isaac Bliss, Rayford Halsted, MD-Internal Medicine o 02.09.22-Consult Visit Dagoberto Ligas, PA-C Physician Assistant-[facility]  2.  Any changes in your medicines or health?  o Estradiol 0.5 g Vaginal 2 times weekly, Place 0.5g nightly for two weeks then twice a week after o Tamsulosin HCl 0.4 mg Oral Daily o Trospium Chloride 60 mg Oral Daily   PCP : Isaac Bliss, Rayford Halsted, MD Allergies:  No Known Allergies  Medications: Outpatient Encounter Medications as of 08/06/2020  Medication Sig  . Accu-Chek FastClix Lancets MISC 1 each by Does not apply route daily.  . Accu-Chek Softclix Lancets lancets USE AS DIRECTED TO CHECK BLOOD SUGAR TWICE A DAY  . amLODipine (NORVASC) 10 MG tablet TAKE ONE TABLET BY MOUTH EVERY MORNING FOR blood pressure  . ASPIRIN LOW DOSE 81 MG EC tablet TAKE 1 TABLET BY MOUTH EVERY DAY (Patient taking differently: Take 81 mg by mouth daily.)  . Blood Glucose Monitoring Suppl (ACCU-CHEK GUIDE) w/Device KIT Please specify directions, refills and quantity  . Catheters (SELF-CATH SYSTEM STRAIGHT TIP) KIT 1 each by Does not apply route as needed.  . Cholecalciferol 25 MCG (1000 UT) capsule Take 1,000 Units by mouth daily.   . Continuous Blood Gluc Receiver (FREESTYLE LIBRE 2 READER) DEVI 1 each by Does not apply route daily.  . Continuous Blood Gluc Sensor (FREESTYLE LIBRE 2 SENSOR) MISC 1 each by Does not apply route daily.  . cyanocobalamin 1000 MCG tablet Take 1,000 mcg by mouth daily.   [name] Kitchen estradiol (ESTRACE) 0.1 MG/GM vaginal cream Place 0.5 g vaginally 2  (two) times a week. Place 0.5g nightly for two weeks then twice a week after  . glucose blood (ACCU-CHEK GUIDE) test strip 1 each by Other route in the morning and at bedtime. Dx E11.9  . hydrochlorothiazide (HYDRODIURIL) 25 MG tablet TAKE ONE TABLET BY MOUTH EVERY MORNING FOR fluid/blood pressure  . meloxicam (MOBIC) 15 MG tablet Take 1 tablet (15 mg total) by mouth daily. (Patient taking differently: Take 15 mg by mouth as needed.)  . metFORMIN (GLUCOPHAGE) 500 MG tablet Take 1 tablet (500 mg total) by mouth daily with breakfast.  . oxyCODONE (OXY IR/ROXICODONE) 5 MG immediate release tablet Take by mouth.  [name] Kitchen REPATHA SURECLICK 782 MG/ML SOAJ INJECT 163m into THE SKIN every 14 DAYS for cholesterol  . tamsulosin (FLOMAX) 0.4 MG CAPS capsule Take 1 capsule (0.4 mg total) by mouth daily.  .[name] Kitchentriamcinolone (KENALOG) 0.1 % APPLY TO THE AFFECTED AREA(S) TWICE DAILY FOR SKIN condition  . Trospium Chloride 60 MG CP24 Take 1 capsule (60 mg total) by mouth daily.   Facility-Administered Encounter Medications as of 08/06/2020  Medication  . sodium chloride flush (NS) 0.9 % injection 3 mL    Current Diagnosis: Patient Active Problem List   Diagnosis Date Noted  . Chronic hepatitis C ([place] 07/02/2020  . Personal history of colonic polyps 07/02/2020  . Fracture of metacarpal bone 05/15/2020  . Peripheral arterial disease ([place] 04/15/2020  . Coronary artery disease   . Chronic diastolic CHF (congestive heart failure) ([place] 03/12/2020  . Dysuria 03/01/2020  . Chronic arthropathy 11/07/2019  .  Hallux limitus of right foot 11/07/2019  . Diabetic neuropathy (HCC) 04/04/2019  . Cirrhosis of liver (HCC) 06/22/2018  . Chronic hepatitis (HCC) 06/22/2018  . Hyperlipidemia associated with type 2 diabetes mellitus (HCC) 06/22/2018  . Carotid artery disease (HCC) 06/22/2018  . 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HCC) 07/02/2016  . Aneurysm of middle cerebral artery   . Cerebrovascular accident (CVA) due to  thrombosis of left vertebral artery (HCC) 06/28/2016  . Cocaine abuse (HCC)   . Stroke (cerebrum) (HCC) 06/27/2016  . Microcytic anemia 12/27/2014  . Thrombocytopenia (HCC) 12/25/2014  . Hypoglycemia 07/21/2013  . Diabetes mellitus (HCC) 07/21/2013  . 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 Liver disease 07/21/2013  . Alcohol abuse 07/21/2013    Goals Addressed   None    Reviewed chart for medication changes ahead of medication coordination call.  BP Readings from Last 3 Encounters:  07/23/20 (!) 188/82  07/17/20 140/80  07/16/20 (!) 200/89    Lab Results  Component Value Date   HGBA1C 6.1 (A) 07/17/2020    Patient obtains medications through Adherence Packaging  30 Days  Last adherence delivery included:  . Amlodipine 10 mg: one tablet at breakfast . Aspirin 81 mg: one tablet at breakfast . Vitamin D3 MCG 1000-unit: one capsule at breakfast . Vitamin B-12 Daily 1000 MCG: one tablet at breakfast . Repatha Sureclick 140 MG/ML Injection- Every 14 Days . Hydrochlorothiazide 25 mg: one tablet at breakfast . Metformin 500 mg: one tablet at breakfast . Triamcinolone cream (KENALOG) 0.1 % . Oxycodone (OXY IR/ROXICODONE) 5 MG immediate release tablet       Patient declined the following medication last month due to PRN use/additional supply on hand. . ACCU- Check Guide test strip . ACCU- Check softclix lancets  Patient is due for next adherence delivery on: 08/15/2020. Called patient and reviewed medications and coordinated delivery. This delivery to include: . Amlodipine 10 mg: one tablet at breakfast . Aspirin 81 mg: one tablet at breakfast . Vitamin D3 MCG 1000-unit: one capsule at breakfast . Vitamin B-12 Daily 1000 MCG: one tablet at breakfast . Repatha Sureclick 140 MG/ML Injection- Every 14 Days . Hydrochlorothiazide 25 mg: one tablet at breakfast . Metformin 500 mg: one tablet at breakfast . Triamcinolone cream (KENALOG) 0.1 %  Patient declined the following  medications  due to additional Supply on hand . ACCU- Check Guide test strip . ACCU- Check softclix lancets . Oxycodone (OXY IR/ROXICODONE) 5 MG immediate release tablet . Estradiol 0.5 g Vaginal 2 times weekly, Place 0.5g nightly for two weeks then twice a week after . Tamsulosin HCl 0.4 mg Oral Daily . Trospium Chloride 60 mg Oral Daily (pending PA)  She currently does not need refills. Confirmed delivery date of 08/15/2020, advised patient that pharmacy will contact them the morning of delivery.  Follow-Up:  Coordination of Enhanced Pharmacy Services and Pharmacist Review  Amilia (Mimi) Frazier, CMA Clinical Pharmacy Assistant [phone]  

## 2020-08-11 DIAGNOSIS — S62306D Unspecified fracture of fifth metacarpal bone, right hand, subsequent encounter for fracture with routine healing: Secondary | ICD-10-CM | POA: Diagnosis not present

## 2020-08-12 NOTE — Telephone Encounter (Signed)
Gwen from Dr. Lupita Leash office contacted the office to follow up on the medical clearance form that they have faxed over.  They also need most recent labs and physical with the clearance form. If the patient hasn't had any labs or physical this year then they are requiring her to have those completed before they can do the surgery.

## 2020-08-13 NOTE — Telephone Encounter (Signed)
Scheduled physical for patient--pt will come fasting.

## 2020-08-13 NOTE — Telephone Encounter (Signed)
Appointment scheduled.

## 2020-08-13 NOTE — Telephone Encounter (Signed)
Can you please schedule this patient for a CPE?

## 2020-08-14 ENCOUNTER — Other Ambulatory Visit: Payer: Self-pay

## 2020-08-15 ENCOUNTER — Ambulatory Visit (INDEPENDENT_AMBULATORY_CARE_PROVIDER_SITE_OTHER): Payer: Medicare Other | Admitting: Internal Medicine

## 2020-08-15 ENCOUNTER — Other Ambulatory Visit: Payer: Self-pay

## 2020-08-15 VITALS — BP 135/80

## 2020-08-15 DIAGNOSIS — E1169 Type 2 diabetes mellitus with other specified complication: Secondary | ICD-10-CM | POA: Diagnosis not present

## 2020-08-15 DIAGNOSIS — E611 Iron deficiency: Secondary | ICD-10-CM

## 2020-08-15 DIAGNOSIS — Z01818 Encounter for other preprocedural examination: Secondary | ICD-10-CM

## 2020-08-15 DIAGNOSIS — I2510 Atherosclerotic heart disease of native coronary artery without angina pectoris: Secondary | ICD-10-CM

## 2020-08-15 DIAGNOSIS — I10 Essential (primary) hypertension: Secondary | ICD-10-CM

## 2020-08-15 DIAGNOSIS — E1149 Type 2 diabetes mellitus with other diabetic neurological complication: Secondary | ICD-10-CM | POA: Diagnosis not present

## 2020-08-15 DIAGNOSIS — E785 Hyperlipidemia, unspecified: Secondary | ICD-10-CM

## 2020-08-15 LAB — CBC WITH DIFFERENTIAL/PLATELET
Basophils Absolute: 0 10*3/uL (ref 0.0–0.1)
Basophils Relative: 0.4 % (ref 0.0–3.0)
Eosinophils Absolute: 0.1 10*3/uL (ref 0.0–0.7)
Eosinophils Relative: 1 % (ref 0.0–5.0)
HCT: 35.6 % — ABNORMAL LOW (ref 36.0–46.0)
Hemoglobin: 11.5 g/dL — ABNORMAL LOW (ref 12.0–15.0)
Lymphocytes Relative: 36 % (ref 12.0–46.0)
Lymphs Abs: 2.4 10*3/uL (ref 0.7–4.0)
MCHC: 32.4 g/dL (ref 30.0–36.0)
MCV: 74.1 fl — ABNORMAL LOW (ref 78.0–100.0)
Monocytes Absolute: 0.6 10*3/uL (ref 0.1–1.0)
Monocytes Relative: 9.7 % (ref 3.0–12.0)
Neutro Abs: 3.5 10*3/uL (ref 1.4–7.7)
Neutrophils Relative %: 52.9 % (ref 43.0–77.0)
Platelets: 139 10*3/uL — ABNORMAL LOW (ref 150.0–400.0)
RBC: 4.81 Mil/uL (ref 3.87–5.11)
RDW: 15.3 % (ref 11.5–15.5)
WBC: 6.6 10*3/uL (ref 4.0–10.5)

## 2020-08-15 LAB — LIPID PANEL
Cholesterol: 147 mg/dL (ref 0–200)
HDL: 61.6 mg/dL (ref >39.00)
LDL Cholesterol: 64 mg/dL (ref 0–99)
NonHDL: 85.71
Total CHOL/HDL Ratio: 2
Triglycerides: 108 mg/dL (ref 0.0–149.0)
VLDL: 21.6 mg/dL (ref 0.0–40.0)

## 2020-08-15 LAB — COMPREHENSIVE METABOLIC PANEL
ALT: 28 U/L (ref 0–35)
AST: 37 U/L (ref 0–37)
Albumin: 4.2 g/dL (ref 3.5–5.2)
Alkaline Phosphatase: 79 U/L (ref 39–117)
BUN: 20 mg/dL (ref 6–23)
CO2: 25 mEq/L (ref 19–32)
Calcium: 9.9 mg/dL (ref 8.4–10.5)
Chloride: 101 mEq/L (ref 96–112)
Creatinine, Ser: 0.72 mg/dL (ref 0.40–1.20)
GFR: 85.84 mL/min (ref >60.00)
Glucose, Bld: 108 mg/dL — ABNORMAL HIGH (ref 70–99)
Potassium: 3.7 mEq/L (ref 3.5–5.1)
Sodium: 138 mEq/L (ref 135–145)
Total Bilirubin: 1.1 mg/dL (ref 0.2–1.2)
Total Protein: 7.7 g/dL (ref 6.0–8.3)

## 2020-08-15 LAB — HEMOGLOBIN A1C: Hgb A1c MFr Bld: 6.3 % (ref 4.6–6.5)

## 2020-08-15 MED ORDER — FERROUS SULFATE 325 (65 FE) MG PO TBEC: 325.0000 mg | DELAYED_RELEASE_TABLET | Freq: Two times a day (BID) | ORAL | 3 refills | Status: AC

## 2020-08-15 NOTE — Progress Notes (Signed)
Established Patient Office Visit     This visit occurred during the SARS-CoV-2 public health emergency.  Safety protocols were in place, including screening questions prior to the visit, additional usage of staff PPE, and extensive cleaning of exam room while observing appropriate contact time as indicated for disinfecting solutions.    CC/Reason for Visit: Preoperative clearance, follow-up chronic conditions  HPI: Deborah Wise is a 70 y.o. female who is coming in today for the above mentioned reasons. Past Medical History is significant for: Coronary artery disease followed by cardiology with a recent catheterization in October 2021 that showed a proximal RCA occlusion of 75%, distal RCA 50% and mid LAD 60%. Medical management was recommended. She has a history of carotid artery disease and follows on a yearly basis with vascular surgery. Was seen by them recently and 6 month follow up US was recommended due to stability.She has a history of hyperlipidemia now on Repatha, history of hypertension that is now better controlled, she had a stroke in January 2018 and follows on an annual basis with neurology, she has hyperlipidemia, type 2 diabetes that has been well controlled and cirrhosis of the liver secondary to alcohol and hepatitis C that is followed by GI. She has some mild thrombocytopenia due to splenomegaly from this. She also has PAD.  She is having some dental extractions sometime in April.  Her surgeon has faxed over a preoperative clearance form.  They are requesting labs and I have none that are recent.  She is not on anticoagulation other than a daily baby aspirin.  She has been having no chest pain or dyspnea on exertion.   Past Medical/Surgical History: Past Medical History:  Diagnosis Date  . Anemia   . Arthritis   . Blood transfusion without reported diagnosis   . Carotid artery occlusion   . Cirrhosis ([place])   . Diabetes mellitus without complication ([place])    . Fibroid, uterine   . GERD (gastroesophageal reflux disease)   . Hepatitis    hep C  . Hypertension   . Stroke ([place])   . Thrombocytopenia ([place]) 12/25/2014  . Urinary incontinence    Patient noticed mild and is not currently a significant problem    Past Surgical History:  Procedure Laterality Date  . ANKLE FRACTURE SURGERY Right 1999   x 2, rod  . CESAREAN SECTION     w/ tubal ligation  . COLONOSCOPY    . IR GENERIC HISTORICAL  06/30/2016   IR ANGIO VERTEBRAL SEL SUBCLAVIAN INNOMINATE UNI R MOD SED 06/30/2016 [name], MD [facility]  . IR GENERIC HISTORICAL  06/30/2016   IR ANGIO VERTEBRAL SEL VERTEBRAL UNI L MOD SED 06/30/2016 [name], MD [facility]  . IR GENERIC HISTORICAL  06/30/2016   IR ANGIO INTRA EXTRACRAN SEL COM CAROTID INNOMINATE BILAT MOD SED 06/30/2016 [name], MD [facility]  . LEFT HEART CATH AND CORONARY ANGIOGRAPHY N/A 03/25/2020   Procedure: LEFT HEART CATH AND CORONARY ANGIOGRAPHY;  Surgeon: Jettie Booze, MD;  Location: [facility];  Service: Cardiovascular;  Laterality: N/A;  . MULTIPLE EXTRACTIONS WITH ALVEOLOPLASTY  10/25/2011   Procedure: MULTIPLE EXTRACION WITH ALVEOLOPLASTY;  Surgeon: Gae Bon, DDS;  Location: [facility];  Service: Oral Surgery;  Laterality: Bilateral;  Extract teeth numbers one, two, six, seven, eight, nine, ten, eleven, twelve, fifteen, sixteen, eighteen, nineteen, twenty-three, twenty-four, twenty-five, twenty-seven, thirty-two and alveoplasty.    Social History:  reports that she has never smoked.  She has never used smokeless tobacco. She reports current alcohol use of about 1.0 standard drink of alcohol per week. She reports previous drug use. Frequency: 1.00 time per week. Drug: Cocaine.  Allergies: No Known Allergies  Family History:  Family History  Problem Relation Age of Onset  . Diabetes type II Mother   . Hypertension Mother   . Stroke Mother   . Cancer Father        patient  does not know what type of cancer  . Diabetes Daughter   . Breast cancer Neg Hx   . Colon polyps Neg Hx   . Crohn's disease Neg Hx   . Esophageal cancer Neg Hx   . Rectal cancer Neg Hx   . Stomach cancer Neg Hx   . Colon cancer Neg Hx      Current Outpatient Medications:  .  Accu-Chek FastClix Lancets MISC, 1 each by Does not apply route daily., Disp: 100 each, Rfl: 3 .  Accu-Chek Softclix Lancets lancets, USE AS DIRECTED TO CHECK BLOOD SUGAR TWICE A DAY, Disp: 100 each, Rfl: 0 .  amLODipine (NORVASC) 10 MG tablet, TAKE ONE TABLET BY MOUTH EVERY MORNING FOR blood pressure, Disp: 90 tablet, Rfl: 1 .  ASPIRIN LOW DOSE 81 MG EC tablet, TAKE 1 TABLET BY MOUTH EVERY DAY (Patient taking differently: Take 81 mg by mouth daily.), Disp: 90 tablet, Rfl: 1 .  Blood Glucose Monitoring Suppl (ACCU-CHEK GUIDE) w/Device KIT, Please specify directions, refills and quantity, Disp: 1 kit, Rfl: 0 .  Catheters (SELF-CATH SYSTEM STRAIGHT TIP) KIT, 1 each by Does not apply route as needed., Disp: 120 kit, Rfl: 11 .  Cholecalciferol 25 MCG (1000 UT) capsule, Take 1,000 Units by mouth daily. , Disp: , Rfl:  .  Continuous Blood Gluc Receiver (FREESTYLE LIBRE 2 READER) DEVI, 1 each by Does not apply route daily., Disp: 2 each, Rfl: 11 .  Continuous Blood Gluc Sensor (FREESTYLE LIBRE 2 SENSOR) MISC, 1 each by Does not apply route daily., Disp: 2 each, Rfl: 11 .  cyanocobalamin 1000 MCG tablet, Take 1,000 mcg by mouth daily. , Disp: , Rfl:  .  estradiol (ESTRACE) 0.1 MG/GM vaginal cream, Place 0.5 g vaginally 2 (two) times a week. Place 0.5g nightly for two weeks then twice a week after, Disp: 30 g, Rfl: 11 .  glucose blood (ACCU-CHEK GUIDE) test strip, 1 each by Other route in the morning and at bedtime. Dx E11.9, Disp: 100 strip, Rfl: 0 .  hydrochlorothiazide (HYDRODIURIL) 25 MG tablet, TAKE ONE TABLET BY MOUTH EVERY MORNING FOR fluid/blood pressure, Disp: 90 tablet, Rfl: 1 .  meloxicam (MOBIC) 15 MG tablet, Take 1  tablet (15 mg total) by mouth daily. (Patient taking differently: Take 15 mg by mouth as needed.), Disp: 30 tablet, Rfl: 0 .  metFORMIN (GLUCOPHAGE) 500 MG tablet, Take 1 tablet (500 mg total) by mouth daily with breakfast., Disp: , Rfl:  .  oxyCODONE (OXY IR/ROXICODONE) 5 MG immediate release tablet, Take by mouth., Disp: , Rfl:  .  REPATHA SURECLICK 970 MG/ML SOAJ, INJECT 177m into THE SKIN every 14 DAYS for cholesterol, Disp: 2 mL, Rfl: 1 .  tamsulosin (FLOMAX) 0.4 MG CAPS capsule, Take 1 capsule (0.4 mg total) by mouth daily., Disp: 30 capsule, Rfl: 5 .  triamcinolone (KENALOG) 0.1 %, APPLY TO THE AFFECTED AREA(S) TWICE DAILY FOR SKIN condition, Disp: 80 g, Rfl: 4 .  Trospium Chloride 60 MG CP24, Take 1 capsule (60 mg total) by mouth daily.,  Disp: 30 capsule, Rfl: 5  Current Facility-Administered Medications:  .  sodium chloride flush (NS) 0.9 % injection 3 mL, 3 mL, Intravenous, Q12H, Skains, Mark C, MD  Review of Systems:  Constitutional: Denies fever, chills, diaphoresis, appetite change and fatigue.  HEENT: Denies photophobia, eye pain, redness, hearing loss, ear pain, congestion, sore throat, rhinorrhea, sneezing, mouth sores, trouble swallowing, neck pain, neck stiffness and tinnitus.   Respiratory: Denies SOB, DOE, cough, chest tightness,  and wheezing.   Cardiovascular: Denies chest pain, palpitations and leg swelling.  Gastrointestinal: Denies nausea, vomiting, abdominal pain, diarrhea, constipation, blood in stool and abdominal distention.  Genitourinary: Denies dysuria, urgency, frequency, hematuria, flank pain and difficulty urinating.  Endocrine: Denies: hot or cold intolerance, sweats, changes in hair or nails, polyuria, polydipsia. Musculoskeletal: Denies myalgias, back pain, joint swelling, arthralgias and gait problem.  Skin: Denies pallor, rash and wound.  Neurological: Denies dizziness, seizures, syncope, weakness, light-headedness, numbness and headaches.   Hematological: Denies adenopathy. Easy bruising, personal or family bleeding history  Psychiatric/Behavioral: Denies suicidal ideation, mood changes, confusion, nervousness, sleep disturbance and agitation    Physical Exam: Vitals:   08/15/20 0809  BP: 135/80    There is no height or weight on file to calculate BMI.   Constitutional: NAD, calm, comfortable Eyes: PERRL, lids and conjunctivae normal ENMT: Mucous membranes are moist.  Respiratory: clear to auscultation bilaterally, no wheezing, no crackles. Normal respiratory effort. No accessory muscle use.  Cardiovascular: Regular rate and rhythm, no murmurs / rubs / gallops. No extremity edema.  Neurologic: Grossly intact and nonfocal Psychiatric: Normal judgment and insight. Alert and oriented x 3. Normal mood.    Impression and Plan:  Pre-operative clearance  Coronary artery disease involving native coronary artery of native heart without angina pectoris  Hyperlipidemia associated with type 2 diabetes mellitus ([place]) - Plan: Lipid panel  Primary hypertension - Plan: CBC with Differential/Platelet, Comprehensive metabolic panel  Type 2 diabetes mellitus with other neurologic complication, without long-term current use of insulin ([place]) - Plan: Hemoglobin A1c   -Since she is fasting I will draw labs today including lipids, CBC, CMP and A1c. -I would advise that she stop her aspirin 5 days prior to surgery. -Have advised patient she needs to follow-up with her cardiologist, this is not mandatory prior to surgery. -Once labs are reviewed, if acceptable I will go ahead and sign her preop clearance form.     [name], MD [facility]

## 2020-08-18 NOTE — Telephone Encounter (Signed)
Gwen from Dr Diona Browner office is needing the surgical clearance notes re faxed

## 2020-08-19 ENCOUNTER — Ambulatory Visit: Payer: Medicare Other | Admitting: Adult Health

## 2020-08-19 NOTE — Progress Notes (Deleted)
[facility] [address] [place]. [place] [address] [phone]       OFFICE FOLLOW UP NOTE  Ms. KITZIA CAMUS Date of Birth:  03/13/1951 Medical Record Number:  [id number]   Referring MD: Dr. Jerilee Hoh  Reason for Referral: Falls and balance issues  Chief complaint: No chief complaint on file.     HPI:   Today, 08/19/2020, Ms. Pizano returns for 6 months scheduled follow-up with prior stroke history and right peroneal neuropathy  Stroke:  Stable without new stroke/TIA symptoms Compliant on aspirin 81 mg daily and Repatha -denies side effects Blood pressure today *** Recent lab work: LDL 64, A1c 6.1  Perineal neuropathy: Recent falls *** Relatively stable since prior visit    History provided for reference purposes only Update 02/18/2020 JM: Ms. Panjwani returns for 69-monthfollow-up  Right peroneal neuropathy has been stable without worsening.  She does report a couple falls since prior visit with a recent fall last week with patient reporting " my whole body went weak and I wasn't able to get myself up without help".  PCP is currently working her up for cardiac condition  History of stroke without new stroke/TIA symptoms remaining on aspirin and Repatha for secondary stroke prevention without side effects.  She apparently self discontinued atorvastatin over 1 month ago due to causing nausea.  Blood pressure today 140/88.  Glucose levels not routinely monitored at home.  No further concerns at this time  Update 08/08/2019 JM: Ms. GKovarikis a 70year old female who is being seen today, 08/08/2019, for routine follow-up. 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  She has not had any additional falls.  She has been stable from a stroke standpoint with continuation of aspirin 81 mg daily, atorvastatin 80 mg daily and Repatha without side effects  for secondary stroke prevention.  Lipid panel obtained after prior visit with LDL 59.  Blood pressure today 174/87 - hasn't taken medications yet today as she normally takes around 12pm.  Patient reports typically at home SBPs 140s. No further neurological concerns at this time.  Update 02/14/2019 Dr. SLeonie Man; she returns for follow-up after last visit 6 months ago.  Patient states about a week or 10 days ago she twisted her back when she stood up and had sudden onset of severe muscle tightness in cramp-like pain in the right posterior back extending to the flank and upper abdomen.  She in fact was seen in the ER on 02/06/2019 for this and thought to have a muscle spasm.  She had an ultrasound of the right upper quadrant which showed no evidence of cholecystitis or any acute hepatic abnormality.  Chest x-ray showed no evidence of pneumonia.  Patient has been started on muscle relaxant Flexeril as well as Mobic and tramadol as needed.  She states her medicines are not obtaining substantial relief.  She did undergo EMG nerve conduction study by Dr. WJannifer Franklinon 11/13/2018 which showed chronic right peroneal entrapment neuropathy of the right fibular head.  No evidence of generalized peripheral neuropathy or radiculopathy was found.  Patient denies habitual squatting or recurrent injuries to her right knee.  She however does admit that she sleeps on her side mostly on the right side.  She also had a follow-up carotid ultrasound in 11/06/2018 which showed moderate for 40 to 59% right ICA and 1-39% left ICA stenosis.  Lipid profile on 08/02/2018  showed LDL cholesterol of 125 and hemoglobin A1c of 5.8.  Patient was started on Repatha injections 140 mg every 2 weeks and has been tolerating it well without injection site reactions or other side effects.  She however remains on Lipitor 80 mg as well.  She has not had any follow-up lipid profile checked.    Initial consult visit 08/02/2018 Dr. Leonie Man: Ms. Perl is a 70 year old  African-American lady seen today for office consultation visit for frequent falls.  History is obtained from the patient, review of electronic medical records and have personally reviewed imaging films where available. She states that she has had a couple of falls since January.  She states the falls are minor they were all unprovoked.  She had no time to catch herself.  She did not lose consciousness.  She bruised her knee.  She was unable to get up without assistance.  She denies any significant pain in the back, radicular pain or pain in the feet.  She does have chronic numbness in her right foot.  She is has remote history of stroke in January 2018 when she had a right paramedian pontine lacunar infarct.  She did have some left leg incoordination but she did improve.  She denies any previous back surgeries.  She denies any lack of bowel or bladder control.  She does admit that she cannot walk far and has to sit down because her legs hurt.  She has not had any follow-up neurovascular studies done since her stroke.  She did have a cerebral catheter angiogram on 06/30/2016 by Dr. 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  She was subsequently followed at vascular surgery clinic and last carotid ultrasound on 05/26/2018 had shown stable 60 to 79% right ICA stenosis.  She has not had any recurrent stroke or TIA symptoms.  She states she is on aspirin and tolerating it well without bleeding or bruising.  Her blood pressure usually is better controlled but today it is elevated in office at 156/85.  She is unable to tell me when she had her last lipid profile or hemoglobin A1c checked.  She does have diabetes and states that her control is adequate.  She has had some minor paresthesias in the feet and does not feel that this is getting worse.     ROS:   14 system review of systems is positive for those  listed in HPI and all other systems negative  PMH:  Past Medical History:  Diagnosis Date  . Anemia   . Arthritis   . Blood transfusion without reported diagnosis   . Carotid artery occlusion   . Cirrhosis ([place])   . Diabetes mellitus without complication ([place])   . Fibroid, uterine   . GERD (gastroesophageal reflux disease)   . Hepatitis    hep C  . Hypertension   . Stroke ([place])   . Thrombocytopenia ([place]) 12/25/2014  . Urinary incontinence    Patient noticed mild and is not currently a significant problem    Social History:  Social History   Socioeconomic History  . Marital status: Legally Separated    Spouse name: Not on file  . Number of children: 3  . Years of education: Not on file  . Highest education level: Not on file  Occupational History  . Occupation: disabled  Tobacco Use  . Smoking status: Never Smoker  .  Smokeless tobacco: Never Used  Vaping Use  . Vaping Use: Never used  Substance and Sexual Activity  . Alcohol use: Yes    Alcohol/week: 1.0 standard drink    Types: 1 Cans of beer per week    Comment: 1/2 bottle of wine and some beer a day  . Drug use: Not Currently    Frequency: 1.0 times per week    Types: Cocaine    Comment: last use cocaine last year 2018  . Sexual activity: Yes    Partners: Male    Comment: married- 11 yrs   Other Topics Concern  . Not on file  Social History Narrative  . Not on file   Social Determinants of Health   Financial Resource Strain: Low Risk   . Difficulty of Paying Living Expenses: Not hard at all  Food Insecurity: Not on file  Transportation Needs: No Transportation Needs  . Lack of Transportation (Medical): No  . Lack of Transportation (Non-Medical): No  Physical Activity: Not on file  Stress: Not on file  Social Connections: Not on file  Intimate Partner Violence: Not on file    Medications:   Current Outpatient Medications on File Prior to Visit  Medication Sig Dispense Refill  . Accu-Chek  FastClix Lancets MISC 1 each by Does not apply route daily. 100 each 3  . Accu-Chek Softclix Lancets lancets USE AS DIRECTED TO CHECK BLOOD SUGAR TWICE A DAY 100 each 0  . amLODipine (NORVASC) 10 MG tablet TAKE ONE TABLET BY MOUTH EVERY MORNING FOR blood pressure 90 tablet 1  . ASPIRIN LOW DOSE 81 MG EC tablet TAKE 1 TABLET BY MOUTH EVERY DAY (Patient taking differently: Take 81 mg by mouth daily.) 90 tablet 1  . Blood Glucose Monitoring Suppl (ACCU-CHEK GUIDE) w/Device KIT Please specify directions, refills and quantity 1 kit 0  . Catheters (SELF-CATH SYSTEM STRAIGHT TIP) KIT 1 each by Does not apply route as needed. 120 kit 11  . Cholecalciferol 25 MCG (1000 UT) capsule Take 1,000 Units by mouth daily.     . Continuous Blood Gluc Receiver (FREESTYLE LIBRE 2 READER) DEVI 1 each by Does not apply route daily. 2 each 11  . Continuous Blood Gluc Sensor (FREESTYLE LIBRE 2 SENSOR) MISC 1 each by Does not apply route daily. 2 each 11  . cyanocobalamin 1000 MCG tablet Take 1,000 mcg by mouth daily.     [name] Kitchen estradiol (ESTRACE) 0.1 MG/GM vaginal cream Place 0.5 g vaginally 2 (two) times a week. Place 0.5g nightly for two weeks then twice a week after 30 g 11  . ferrous sulfate 325 (65 FE) MG EC tablet Take 1 tablet (325 mg total) by mouth in the morning and at bedtime.  3  . glucose blood (ACCU-CHEK GUIDE) test strip 1 each by Other route in the morning and at bedtime. Dx E11.9 100 strip 0  . hydrochlorothiazide (HYDRODIURIL) 25 MG tablet TAKE ONE TABLET BY MOUTH EVERY MORNING FOR fluid/blood pressure 90 tablet 1  . meloxicam (MOBIC) 15 MG tablet Take 1 tablet (15 mg total) by mouth daily. (Patient taking differently: Take 15 mg by mouth as needed.) 30 tablet 0  . metFORMIN (GLUCOPHAGE) 500 MG tablet Take 1 tablet (500 mg total) by mouth daily with breakfast.    . oxyCODONE (OXY IR/ROXICODONE) 5 MG immediate release tablet Take by mouth.    [name] Kitchen REPATHA SURECLICK 654 MG/ML SOAJ INJECT 131m into THE SKIN every  14 DAYS for cholesterol 2 mL 1  .  tamsulosin (FLOMAX) 0.4 MG CAPS capsule Take 1 capsule (0.4 mg total) by mouth daily. 30 capsule 5  . triamcinolone (KENALOG) 0.1 % APPLY TO THE AFFECTED AREA(S) TWICE DAILY FOR SKIN condition 80 g 4  . Trospium Chloride 60 MG CP24 Take 1 capsule (60 mg total) by mouth daily. 30 capsule 5   Current Facility-Administered Medications on File Prior to Visit  Medication Dose Route Frequency Provider Last Rate Last Admin  . sodium chloride flush (NS) 0.9 % injection 3 mL  3 mL Intravenous Q12H Jerline Pain, MD        Allergies:  No Known Allergies  Physical Exam  There were no vitals filed for this visit. There is no height or weight on file to calculate BMI.  General: Very Pleasant middle-aged African-American lady, seated, in no evident distress Head: head normocephalic and atraumatic.   Neck: supple with no carotid or supraclavicular bruits Cardiovascular: regular rate and rhythm, no murmurs Musculoskeletal: no deformity Skin:  no rash/petichiae Vascular:  Normal pulses all extremities  Neurologic Exam Mental Status: Awake and fully alert.  Mild dysarthria.  Oriented to place and time. Recent and remote memory intact. Attention span, concentration and fund of knowledge appropriate. Mood and affect appropriate.  Cranial Nerves: Pupils equal, briskly reactive to light. Extraocular movements full without nystagmus. Visual fields full to confrontation. Hearing intact. Facial sensation intact. Face, tongue, palate moves normally and symmetrically.  Motor: Normal bulk and tone. Normal strength in all tested extremity muscles except mild L>R hip flexor weakness. Sensory.: intact to touch , pinprick , position but diminished vibratory sensation over toes bilaterally.  Coordination: Rapid alternating movements normal in all extremities. Finger-to-nose and heel-to-shin performed accurately bilaterally. Gait and Station: Arises from chair without difficulty.  Stance is normal. Gait demonstrates short stride steps with mild imbalance without use of assistive device Reflexes: 1+ and symmetric except ankle jerks are diminished. Toes downgoing.      ASSESSMENT/PLAN: 70 year old African-American lady with recent episodes of falls likely multifactorial due to combination of underlying right peroneal neuropathy which has been stable as well as prior right pontine stroke in January 2018 and asymptomatic moderate right carotid stenosis.  Vascular risk factors of diabetes, hypertension, hyperlipidemia, stroke and carotid stenosis.      Hx R pontine stroke -Continue aspirin and Repatha for secondary stroke prevention  -Discussed secondary stroke prevention and importance of close PCP follow-up for aggressive stroke risk factor management -HTN: BP goal<130/90 -HLD: LDL goal<70.  LDL 64 (08/2020) on Repatha -DM: A1c goal<7. A1c 6.3 (08/2020) on Metformin per PCP  B/l carotid stenosis -Continue yearly carotid ultrasound studies to follow her moderate right carotid stenosis with vascular surgery  -CUS 07/09/2020 and 01/02/2020 stable R ICA 60 to 79% stenosis and L ICA 40 to 59% stenosis  Right peroneal neuropathy -Relatively stable since prior visit     Follow-up in 6 months or call earlier if needed   CC:  [facility] provider: Dr. Lum Babe, Rayford Halsted, MD    I spent 30 minutes of face-to-face and non-face-to-face time with patient.  This included previsit chart review, lab review, study review, order entry, electronic health record documentation, patient education/discussion regarding right peroneal neuropathy, history of prior stroke and residual deficits, routine carotid stenosis monitoring and review of recent carotid ultrasound, importance of managing stroke risk factors and indication for repeat lab work and answered all other questions to patient satisfaction   Frann Rider, AGNP-BC  [facility] [address]  [place], [place] [address]  Phone [phone] Fax [phone] Note: This document was prepared with digital dictation and possible smart phrase technology. Any transcriptional errors that result from this process are unintentional.

## 2020-08-19 NOTE — Telephone Encounter (Signed)
Form placed on Dr Hernandez's desk 

## 2020-08-20 ENCOUNTER — Telehealth: Payer: Self-pay | Admitting: Internal Medicine

## 2020-08-20 NOTE — Telephone Encounter (Signed)
Meredith Mody is calling and stated that patient needs a dental extraction with alveoloplasty and Dr. Diona Browner needed recent labs to be faxed to [phone] before scheduling, please advise. CB is [phone]

## 2020-08-20 NOTE — Telephone Encounter (Signed)
Copy of lab results faxed and confirmed

## 2020-08-21 ENCOUNTER — Encounter: Payer: Self-pay | Admitting: Adult Health

## 2020-08-21 ENCOUNTER — Ambulatory Visit (INDEPENDENT_AMBULATORY_CARE_PROVIDER_SITE_OTHER): Payer: Medicare Other | Admitting: Adult Health

## 2020-08-21 VITALS — BP 178/86 | HR 82 | Ht 65.0 in | Wt 174.0 lb

## 2020-08-21 DIAGNOSIS — G5731 Lesion of lateral popliteal nerve, right lower limb: Secondary | ICD-10-CM

## 2020-08-21 DIAGNOSIS — Z8673 Personal history of transient ischemic attack (TIA), and cerebral infarction without residual deficits: Secondary | ICD-10-CM | POA: Diagnosis not present

## 2020-08-21 DIAGNOSIS — I6523 Occlusion and stenosis of bilateral carotid arteries: Secondary | ICD-10-CM

## 2020-08-21 NOTE — Patient Instructions (Signed)
Schedule follow up visit with your cardiologist Dr. Gwenlyn Found to further discuss high blood pressure Office number [phone]  Continue aspirin 81 mg daily  and Repatha  for secondary stroke prevention  Continue to follow up with PCP regarding cholesterol, blood pressure and diabetes management  Maintain strict control of hypertension with blood pressure goal below 130/90, diabetes with hemoglobin A1c goal below 7% and cholesterol with LDL cholesterol (bad cholesterol) goal below 70 mg/dL.       Followup in the future with me in 6 months or call earlier if needed       Thank you for coming to see [place] at [facility]. I hope we have been able to provide you high quality care today.  You may receive a patient satisfaction survey over the next few weeks. We would appreciate your feedback and comments so that we may continue to improve ourselves and the health of our patients.

## 2020-08-21 NOTE — Progress Notes (Signed)
[facility] [address] [place]. [place] [address] [phone]       OFFICE FOLLOW UP NOTE  [name] Date of Birth:  01/01/1951 Medical Record Number:  [id number]   Referring MD: Dr. Jerilee Hoh  Reason for Referral: Falls and balance issues  Chief complaint: Chief Complaint  Patient presents with  . Follow-up    RM 14 alone PT is just having trouble walking, cant walk long or far      HPI:   Today, 08/21/2020, Deborah Wise returns for 6 months scheduled follow-up with prior stroke history and right peroneal neuropathy  Hx of Stroke:  Stable without new stroke/TIA symptoms Compliant on aspirin 81 mg daily and Repatha -denies side effects Blood pressure today 178/86 - blood pressure has been elevated more recently per patient but recent OV with PCP BP 135/80 -she plans on scheduling follow-up with cardiology Recent lab work: LDL 64, A1c 6.1  Perineal neuropathy: No recent falls Relatively stable since prior visit   reports having a test tomorrow "to look at flow in my legs" - reports symptoms consistent with claudication  Also following with urology for urinary retention - she has been having to straight cath -personally reviewed urology OV notes and 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  She does report a couple falls since prior visit with a recent fall last week with patient reporting " my whole body went weak and I wasn't able to get myself up without help".  PCP is currently working her up for cardiac condition  History of stroke without new stroke/TIA symptoms remaining on aspirin and Repatha for secondary stroke prevention without side effects.  She apparently self discontinued atorvastatin over 1 month ago due to  causing nausea.  Blood pressure today 140/88.  Glucose levels not routinely monitored at home.  No further concerns at this time  Update 08/08/2019 JM: Deborah Wise a 70year old female who is being seen today, 08/08/2019, for routine follow-up. 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  She has not had any additional falls.  She has been stable from a stroke standpoint with continuation of aspirin 81 mg daily, atorvastatin 80 mg daily and Repatha without side effects for secondary stroke prevention.  Lipid panel obtained after prior visit with LDL 59.  Blood pressure today 174/87 - hasn't taken medications yet today as she normally takes around 12pm.  Patient reports typically at home SBPs 140s. No further neurological concerns at this time.  Update 02/14/2019 Dr. SLeonie Man; she returns for follow-up after last visit 6 months ago.  Patient states about a week or 10 days ago she twisted her back when she stood up and had sudden onset of severe muscle tightness in cramp-like pain in the right posterior back extending to the flank and upper abdomen.  She in fact was seen in the ER on 02/06/2019 for this and thought to have a muscle spasm.  She had an ultrasound of the right upper quadrant which showed no evidence of cholecystitis or any acute hepatic abnormality.  Chest x-ray showed no evidence of pneumonia.  Patient has been started on muscle relaxant Flexeril as well as Mobic and tramadol as  needed.  She states her medicines are not obtaining substantial relief.  She did undergo EMG nerve conduction study by Dr. 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  No evidence of generalized peripheral neuropathy or radiculopathy was found.  Patient denies habitual squatting or recurrent injuries to her right knee.  She however does admit that she sleeps on  her side mostly on the right side.  She also had a follow-up carotid ultrasound in 11/06/2018 which showed moderate for 40 to 59% right ICA and 1-39% left ICA stenosis.  Lipid profile on 08/02/2018 showed LDL cholesterol of 125 and hemoglobin A1c of 5.8.  Patient was started on Repatha injections 140 mg every 2 weeks and has been tolerating it well without injection site reactions or other side effects.  She however remains on Lipitor 80 mg as well.  She has not had any follow-up lipid profile checked.    Initial consult visit 08/02/2018 Dr. Leonie Man: Deborah Wise is a 70 year old African-American lady seen today for office consultation visit for frequent falls.  History is obtained from the patient, review of electronic medical records and have personally reviewed imaging films where available. She states that she has had a couple of falls since January.  She states the falls are minor they were all unprovoked.  She had no time to catch herself.  She did not lose consciousness.  She bruised her knee.  She was unable to get up without assistance.  She denies any significant pain in the back, radicular pain or pain in the feet.  She does have chronic numbness in her right foot.  She is has remote history of stroke in January 2018 when she had a right paramedian pontine lacunar infarct.  She did have some left leg incoordination but she did improve.  She denies any previous back surgeries.  She denies any lack of bowel or bladder control.  She does admit that she cannot walk far and has to sit down because her legs hurt.  She has not had any follow-up neurovascular studies done since her stroke.  She did have a cerebral catheter angiogram on 06/30/2016 by Dr. 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  She was subsequently followed at vascular surgery clinic and last carotid ultrasound on 05/26/2018 had shown stable 60  to 79% right ICA stenosis.  She has not had any recurrent stroke or TIA symptoms.  She states she is on aspirin and tolerating it well without bleeding or bruising.  Her blood pressure usually is better controlled but today it is elevated in office at 156/85.  She is unable to tell me when she had her last lipid profile or hemoglobin A1c checked.  She does have diabetes and states that her control is adequate.  She has had some minor paresthesias in the feet and does not feel that this is getting worse.     ROS:   14 system review of systems is positive for those listed in HPI and all other systems negative  PMH:  Past Medical History:  Diagnosis Date  . Anemia   . Arthritis   . Blood transfusion without reported diagnosis   . Carotid artery occlusion   . Cirrhosis ([place])   . Diabetes mellitus without complication ([place])   . Fibroid, uterine   . GERD (gastroesophageal reflux disease)   .  Hepatitis    hep C  . Hypertension   . Stroke ([place])   . Thrombocytopenia ([place]) 12/25/2014  . Urinary incontinence    Patient noticed mild and is not currently a significant problem    Social History:  Social History   Socioeconomic History  . Marital status: Legally Separated    Spouse name: Not on file  . Number of children: 3  . Years of education: Not on file  . Highest education level: Not on file  Occupational History  . Occupation: disabled  Tobacco Use  . Smoking status: Never Smoker  . Smokeless tobacco: Never Used  Vaping Use  . Vaping Use: Never used  Substance and Sexual Activity  . Alcohol use: Yes    Alcohol/week: 1.0 standard drink    Types: 1 Cans of beer per week    Comment: 1/2 bottle of wine and some beer a day  . Drug use: Not Currently    Frequency: 1.0 times per week    Types: Cocaine    Comment: last use cocaine last year 2018  . Sexual activity: Yes    Partners: Male    Comment: married- 11 yrs   Other Topics Concern  . Not on file  Social History  Narrative  . Not on file   Social Determinants of Health   Financial Resource Strain: Low Risk   . Difficulty of Paying Living Expenses: Not hard at all  Food Insecurity: Not on file  Transportation Needs: No Transportation Needs  . Lack of Transportation (Medical): No  . Lack of Transportation (Non-Medical): No  Physical Activity: Not on file  Stress: Not on file  Social Connections: Not on file  Intimate Partner Violence: Not on file    Medications:   Current Outpatient Medications on File Prior to Visit  Medication Sig Dispense Refill  . Accu-Chek FastClix Lancets MISC 1 each by Does not apply route daily. 100 each 3  . Accu-Chek Softclix Lancets lancets USE AS DIRECTED TO CHECK BLOOD SUGAR TWICE A DAY 100 each 0  . amLODipine (NORVASC) 10 MG tablet TAKE ONE TABLET BY MOUTH EVERY MORNING FOR blood pressure 90 tablet 1  . ASPIRIN LOW DOSE 81 MG EC tablet TAKE 1 TABLET BY MOUTH EVERY DAY (Patient taking differently: Take 81 mg by mouth daily.) 90 tablet 1  . Blood Glucose Monitoring Suppl (ACCU-CHEK GUIDE) w/Device KIT Please specify directions, refills and quantity 1 kit 0  . Catheters (SELF-CATH SYSTEM STRAIGHT TIP) KIT 1 each by Does not apply route as needed. 120 kit 11  . Cholecalciferol 25 MCG (1000 UT) capsule Take 1,000 Units by mouth daily.     . Continuous Blood Gluc Receiver (FREESTYLE LIBRE 2 READER) DEVI 1 each by Does not apply route daily. 2 each 11  . Continuous Blood Gluc Sensor (FREESTYLE LIBRE 2 SENSOR) MISC 1 each by Does not apply route daily. 2 each 11  . cyanocobalamin 1000 MCG tablet Take 1,000 mcg by mouth daily.     [name] Kitchen estradiol (ESTRACE) 0.1 MG/GM vaginal cream Place 0.5 g vaginally 2 (two) times a week. Place 0.5g nightly for two weeks then twice a week after 30 g 11  . ferrous sulfate 325 (65 FE) MG EC tablet Take 1 tablet (325 mg total) by mouth in the morning and at bedtime.  3  . glucose blood (ACCU-CHEK GUIDE) test strip 1 each by Other route in the  morning and at bedtime. Dx E11.9 100 strip 0  . hydrochlorothiazide (  HYDRODIURIL) 25 MG tablet TAKE ONE TABLET BY MOUTH EVERY MORNING FOR fluid/blood pressure 90 tablet 1  . meloxicam (MOBIC) 15 MG tablet Take 1 tablet (15 mg total) by mouth daily. (Patient taking differently: Take 15 mg by mouth as needed.) 30 tablet 0  . metFORMIN (GLUCOPHAGE) 500 MG tablet Take 1 tablet (500 mg total) by mouth daily with breakfast.    . oxyCODONE (OXY IR/ROXICODONE) 5 MG immediate release tablet Take by mouth.    [name] Kitchen REPATHA SURECLICK 403 MG/ML SOAJ INJECT 162m into THE SKIN every 14 DAYS for cholesterol 2 mL 1  . tamsulosin (FLOMAX) 0.4 MG CAPS capsule Take 1 capsule (0.4 mg total) by mouth daily. 30 capsule 5  . triamcinolone (KENALOG) 0.1 % APPLY TO THE AFFECTED AREA(S) TWICE DAILY FOR SKIN condition 80 g 4  . Trospium Chloride 60 MG CP24 Take 1 capsule (60 mg total) by mouth daily. 30 capsule 5   Current Facility-Administered Medications on File Prior to Visit  Medication Dose Route Frequency Provider Last Rate Last Admin  . sodium chloride flush (NS) 0.9 % injection 3 mL  3 mL Intravenous Q12H SJerline Pain MD        Allergies:  No Known Allergies  Physical Exam  Today's Vitals   08/21/20 1539  BP: (!) 178/86  Pulse: 82  Weight: 174 lb (78.9 kg)  Height: 5' 5" (1.651 m)   Body mass index is 28.96 kg/m.  General: Very Pleasant middle-aged African-American lady, seated, in no evident distress Head: head normocephalic and atraumatic.   Neck: supple with no carotid or supraclavicular bruits Cardiovascular: regular rate and rhythm, no murmurs Musculoskeletal: no deformity Skin:  no rash/petichiae Vascular:  Normal pulses all extremities  Neurologic Exam Mental Status: Awake and fully alert.  Mild dysarthria although poor denture noted.  Oriented to place and time. Recent and remote memory intact. Attention span, concentration and fund of knowledge appropriate. Mood and affect appropriate.   Cranial Nerves: Pupils equal, briskly reactive to light. Extraocular movements full without nystagmus. Visual fields full to confrontation. Hearing intact. Facial sensation intact. Face, tongue, palate moves normally and symmetrically.  Motor: Normal bulk and tone. Normal strength in all tested extremity muscles except mild L>R hip flexor weakness. Sensory.: intact to touch , pinprick , position but diminished vibratory sensation over toes bilaterally.  Coordination: Rapid alternating movements normal in all extremities. Finger-to-nose and heel-to-shin performed accurately bilaterally. Gait and Station: Arises from chair without difficulty. Stance is normal. Gait demonstrates decreased stride length and step height with mild imbalance without use of assistive device Reflexes: 1+ and symmetric except ankle jerks are diminished. Toes downgoing.      ASSESSMENT/PLAN: 70year old African-American lady with recent episodes of falls likely multifactorial due to combination of underlying right peroneal neuropathy which has been stable as well as prior right pontine stroke in January 2018 and asymptomatic moderate right carotid stenosis.  Vascular risk factors of diabetes, hypertension, hyperlipidemia, stroke and carotid stenosis.      Hx R pontine stroke -Continue aspirin and Repatha for secondary stroke prevention  -Discussed secondary stroke prevention and importance of close PCP follow-up for aggressive stroke risk factor management -HTN: BP goal<130/90 -uncontrolled on amlodipine and hydrochlorothiazide -advised to follow-up with cardiology or PCP for BP management -HLD: LDL goal<70.  LDL 64 (08/2020) on Repatha -DM: A1c goal<7. A1c 6.3 (08/2020) on Metformin per PCP  B/l carotid stenosis -Continue yearly carotid ultrasound studies to follow her moderate right carotid stenosis with vascular surgery  -  CUS 07/09/2020 and 01/02/2020 stable R ICA 60 to 79% stenosis and L ICA 40 to 59% stenosis  Right  peroneal neuropathy -Relatively stable since prior visit -No recent falls     Follow-up in 6 months or call earlier if needed   CC:  [facility] provider: Dr. Lum Babe, Rayford Halsted, MD    I spent 30 minutes of face-to-face and non-face-to-face time with patient.  This included previsit chart review, lab review, study review, order entry, electronic health record documentation, patient education/discussion regarding right peroneal neuropathy, history of prior stroke and residual deficits, importance of managing stroke risk factors and routine surveillance monitoring of carotid stenosis and answered all other questions to patient satisfaction  Frann Rider, AGNP-BC  [facility] [address] [place], [place] [address]  Phone [phone] Fax [phone] Note: This document was prepared with digital dictation and possible smart phrase technology. Any transcriptional errors that result from this process are unintentional.

## 2020-08-22 DIAGNOSIS — R339 Retention of urine, unspecified: Secondary | ICD-10-CM | POA: Diagnosis not present

## 2020-08-22 DIAGNOSIS — M5459 Other low back pain: Secondary | ICD-10-CM | POA: Diagnosis not present

## 2020-08-24 NOTE — Progress Notes (Signed)
I agree with the above plan 

## 2020-09-01 NOTE — Progress Notes (Signed)
[facility] Return Visit  SUBJECTIVE  History of Present Illness: Deborah Wise is a 70 y.o. female seen in follow-up for incomplete bladder emptying and vaginal atrophy.  :Last visit she was told to increase catheterizing to at least 4 times per day, she is doing this and sometimes more. She was started on Tropsium 60mg  daily and Flomax 0.4mg . Urgency and leakage has improved. Does not feel the flomax is helping with emptying but would like to continue it a bit longer.    Has not been using the estrogen cream because she thought it was only for sexual activity. She has been having some irritation from the catheterization around the urethra.   Past Medical History: Patient  has a past medical history of Anemia, Arthritis, Blood transfusion without reported diagnosis, Carotid artery occlusion, Cirrhosis ([place]), Diabetes mellitus without complication ([place]), Fibroid, uterine, GERD (gastroesophageal reflux disease), Hepatitis, Hypertension, Stroke ([place]), Thrombocytopenia ([place]) (12/25/2014), and Urinary incontinence.   Past Surgical History: She  has a past surgical history that includes Multiple extractions with alveoloplasty (10/25/2011); ir generic historical (06/30/2016); ir generic historical (06/30/2016); ir generic historical (06/30/2016); Ankle fracture surgery (Right, 1999); Colonoscopy; LEFT HEART CATH AND CORONARY ANGIOGRAPHY (N/A, 03/25/2020); and Cesarean section.   Medications: She has a current medication list which includes the following prescription(s): accu-chek fastclix lancets, accu-chek softclix lancets, amlodipine, aspirin low dose, accu-chek guide, self-cath system straight tip, cholecalciferol, freestyle libre 2 reader, freestyle libre 2 sensor, cyanocobalamin, estradiol, ferrous sulfate, accu-chek guide, hydrochlorothiazide, lidocaine, meloxicam, metformin, oxycodone, repatha sureclick, tamsulosin, triamcinolone, and trospium chloride, and the following  Facility-Administered Medications: sodium chloride flush.   Allergies: Patient has No Known Allergies.   Social History: Patient  reports that she has never smoked. She has never used smokeless tobacco. She reports current alcohol use of about 1.0 standard drink of alcohol per week. She reports previous drug use. Frequency: 1.00 time per week. Drug: Cocaine.      OBJECTIVE     Physical Exam: Vitals:   09/03/20 0919  BP: (!) 179/76  Pulse: 86  Weight: 174 lb (78.9 kg)  Height: 5\' 5"  (1.651 m)   Gen: No apparent distress, A&O x 3.  Detailed Urogynecologic Evaluation:  Deferred.    ASSESSMENT AND PLAN    [name] is a 70 y.o. with:  1. Bladder outlet obstruction   2. Detrusor overactivity   3. Vulvar irritation     1. BOO/ Detrusor overactivity/ elevated detrusor pressure - Continue to self catheterize at least 4 times daily. Has been getting supplies from [facility] [employer].  - Can continue flomax for another month but we discussed that if there is no improvement with emptying then it will likely not be useful to continue.  - Continue Trospium 60mg  daily to reduce urgency and detrusor pressures. Refill provided to last a year.  - Renal US previously ordered to assess for hydronephrosis. She still has not scheduled this. Discussed the importance of assessing the kidneys. Number provided for scheduling again- will follow up with her again next week if this is not scheduled.   2. Vaginal atrophy/ irritation - Start estrace cream 0.5g twice a week - Also ordered lidocaine jelly to help with some of the discomfort around the urethera. She can use this as needed.   Return 5 months or sooner if needed.   [name], MD   Time spent: I spent 20 minutes dedicated to the care of this patient on the date of this encounter to include pre-visit  review of records, face-to-face time with the patient and post visit documentation and ordering medication/ testing.

## 2020-09-03 ENCOUNTER — Other Ambulatory Visit: Payer: Self-pay

## 2020-09-03 ENCOUNTER — Encounter: Payer: Self-pay | Admitting: Obstetrics and Gynecology

## 2020-09-03 ENCOUNTER — Ambulatory Visit (INDEPENDENT_AMBULATORY_CARE_PROVIDER_SITE_OTHER): Payer: Medicare Other | Admitting: Obstetrics and Gynecology

## 2020-09-03 VITALS — BP 179/76 | HR 86 | Ht 65.0 in | Wt 174.0 lb

## 2020-09-03 DIAGNOSIS — N9089 Other specified noninflammatory disorders of vulva and perineum: Secondary | ICD-10-CM | POA: Diagnosis not present

## 2020-09-03 DIAGNOSIS — N3281 Overactive bladder: Secondary | ICD-10-CM | POA: Diagnosis not present

## 2020-09-03 DIAGNOSIS — N320 Bladder-neck obstruction: Secondary | ICD-10-CM

## 2020-09-03 MED ORDER — LIDOCAINE HCL URETHRAL/MUCOSAL 2 % EX GEL: 1.0000 "application " | CUTANEOUS | 5 refills | Status: AC | PRN

## 2020-09-03 NOTE — Patient Instructions (Signed)
Continue the Trospium and Flomax. If Flomax is not helping you urinate on your own after another month, then you can discontinue.   Start vaginal estrogen twice a week. Use a pea sized amount in the vagina or urethra area.   Lidocaine jelly was ordered- you can use this as needed in the urethra area to help with discomfort.   You need a renal ultrasound to check the kidneys. Please call to arrange this appointment.

## 2020-09-05 DIAGNOSIS — M545 Low back pain, unspecified: Secondary | ICD-10-CM | POA: Diagnosis not present

## 2020-09-08 ENCOUNTER — Telehealth: Payer: Self-pay | Admitting: Pharmacist

## 2020-09-08 ENCOUNTER — Telehealth: Payer: Self-pay | Admitting: Internal Medicine

## 2020-09-08 NOTE — Telephone Encounter (Signed)
Patient is calling and wanted to see if the provider could recommend her to see a chiropractor for her back because per patient physical therapy is not helping and is making it worse than before, please advise. CB is [phone]

## 2020-09-08 NOTE — Chronic Care Management (AMB) (Addendum)
Chronic Care Management Pharmacy Assistant   Name: KALIN AMRHEIN  MRN: [id number] DOB: [birth year]  Reason for Encounter: Disease State and Medication Review-Medication Coordination Call   Conditions to be addressed/monitored: DMII  Recent office visits:  03.11.2022 Isaac Bliss, Rayford Halsted, MD Internal Medicine Added Ferrous Sulfate 235 mg: one tablet at breakfast and at bedtime  Recent consult visits:  03.30.2022 [name], MD Obstetrics and Gynecology Lidocaine HCl 2 % 1 application Topical As needed 03.17.2022 Frann Rider, NP Neurology  Hospital visits:  None in previous 6 months  Medications: Outpatient Encounter Medications as of 09/08/2020  Medication Sig   Accu-Chek FastClix Lancets MISC 1 each by Does not apply route daily.   Accu-Chek Softclix Lancets lancets USE AS DIRECTED TO CHECK BLOOD SUGAR TWICE A DAY   amLODipine (NORVASC) 10 MG tablet TAKE ONE TABLET BY MOUTH EVERY MORNING FOR blood pressure   ASPIRIN LOW DOSE 81 MG EC tablet TAKE 1 TABLET BY MOUTH EVERY DAY (Patient taking differently: Take 81 mg by mouth daily.)   Blood Glucose Monitoring Suppl (ACCU-CHEK GUIDE) w/Device KIT Please specify directions, refills and quantity   Catheters (SELF-CATH SYSTEM STRAIGHT TIP) KIT 1 each by Does not apply route as needed.   Cholecalciferol 25 MCG (1000 UT) capsule Take 1,000 Units by mouth daily.    Continuous Blood Gluc Receiver (FREESTYLE LIBRE 2 READER) DEVI 1 each by Does not apply route daily.   Continuous Blood Gluc Sensor (FREESTYLE LIBRE 2 SENSOR) MISC 1 each by Does not apply route daily.   cyanocobalamin 1000 MCG tablet Take 1,000 mcg by mouth daily.    estradiol (ESTRACE) 0.1 MG/GM vaginal cream Place 0.5 g vaginally 2 (two) times a week. Place 0.5g nightly for two weeks then twice a week after   ferrous sulfate 325 (65 FE) MG EC tablet Take 1 tablet (325 mg total) by mouth in the morning and at bedtime.   glucose blood (ACCU-CHEK GUIDE)  test strip 1 each by Other route in the morning and at bedtime. Dx E11.9   hydrochlorothiazide (HYDRODIURIL) 25 MG tablet TAKE ONE TABLET BY MOUTH EVERY MORNING FOR fluid/blood pressure   lidocaine (XYLOCAINE) 2 % jelly Apply 1 application topically as needed.   meloxicam (MOBIC) 15 MG tablet Take 1 tablet (15 mg total) by mouth daily. (Patient taking differently: Take 15 mg by mouth as needed.)   metFORMIN (GLUCOPHAGE) 500 MG tablet Take 1 tablet (500 mg total) by mouth daily with breakfast.   oxyCODONE (OXY IR/ROXICODONE) 5 MG immediate release tablet Take by mouth.   REPATHA SURECLICK 233 MG/ML SOAJ INJECT 189m into THE SKIN every 14 DAYS for cholesterol   tamsulosin (FLOMAX) 0.4 MG CAPS capsule Take 1 capsule (0.4 mg total) by mouth daily.   triamcinolone (KENALOG) 0.1 % APPLY TO THE AFFECTED AREA(S) TWICE DAILY FOR SKIN condition   Trospium Chloride 60 MG CP24 Take 1 capsule (60 mg total) by mouth daily.   Facility-Administered Encounter Medications as of 09/08/2020  Medication   sodium chloride flush (NS) 0.9 % injection 3 mL   Reviewed chart for medication changes ahead of medication coordination call.  BP Readings from Last 3 Encounters:  09/03/20 (!) 179/76  08/21/20 (!) 178/86  08/15/20 135/80    Lab Results  Component Value Date   HGBA1C 6.3 08/15/2020   Patient obtains medications through Adherence Packaging  30 Days  Last adherence delivery included:  Amlodipine 10 mg: one tablet at breakfast Aspirin 81 mg: one tablet  at breakfast Vitamin D3 MCG 1000-unit: one capsule at breakfast Vitamin B-12 Daily 1000 MCG: one tablet at breakfast Repatha Sureclick 001 MG/ML Injection- Every 14 Days Hydrochlorothiazide 25 mg: one tablet at breakfast Metformin 500 mg: one tablet at breakfast Triamcinolone cream (KENALOG) 0.1 % Trospium Chloride 60 mg Oral Daily (in vial)     Patient declined the following medication  last month due to PRN use/additional supply on hand. ACCU- Check  Guide test strip ACCU- Check softclix lancets Oxycodone (OXY IR/ROXICODONE) 5 MG immediate release tablet Estradiol 0.5 g Vaginal 2 times weekly, Place 0.5g nightly for two weeks then twice a week after Tamsulosin HCl 0.4 mg Oral Daily Patient is due for next adherence delivery on: 09/16/2020. Called patient and reviewed medications and coordinated delivery. This delivery to include: Amlodipine 10 mg: one tablet at breakfast Aspirin 81 mg: one tablet at breakfast Vitamin D3 MCG 1000-unit: one capsule at breakfast Vitamin B-12 Daily 1000 MCG: one tablet at breakfast Repatha Sureclick 749 MG/ML Injection- Every 14 Days Hydrochlorothiazide 25 mg: one tablet at breakfast Metformin 500 mg: one tablet at breakfast Triamcinolone cream (KENALOG) 0.1 %: apply to affected areas twice daily Trospium Chloride 60 mg Oral Daily (in vial) Tamsulosin HCl 0.4 mg: one capsule at breakfast Ferrous sulfate 325 (65 FE) MG EC tablet: one tablet at breakfast and one tablet at bedtime Estradiol 0.5 g Vaginal 2 times weekly, Place 0.5g nightly for two weeks then twice a week after  Patient declined the following medications due to additional supply on hand. ACCU- Check Guide test strip ACCU- Check softclix lancets Oxycodone (OXY IR/ROXICODONE) 5 MG immediate release tablet -PRN use  She currently does not need refill. Confirmed delivery date of 09/16/2020, advised patient that pharmacy will contact them the morning of delivery.  Recent Relevant Labs: Lab Results  Component Value Date/Time   HGBA1C 6.3 08/15/2020 08:12 AM   HGBA1C 6.1 (A) 07/17/2020 02:19 PM   HGBA1C 5.5 02/20/2020 08:22 AM   MICROALBUR 52.8 (H) 06/22/2018 09:01 AM    Kidney Function Lab Results  Component Value Date/Time   CREATININE 0.72 08/15/2020 08:12 AM   CREATININE 0.83 03/18/2020 03:56 PM   CREATININE 0.8 12/31/2014 02:29 PM   GFR 85.84 08/15/2020 08:12 AM   GFRNONAA 73 03/18/2020 03:56 PM   GFRAA 84 03/18/2020 03:56 PM     Current antihyperglycemic regimen:  Metformin 500 mg at breakfast What recent interventions/DTPs have been made to improve glycemic control:  No changes Have there been any recent hospitalizations or ED visits since last visit with CPP? No Patient denies hypoglycemic symptoms, including Pale, Sweaty, Shaky, Hungry, Nervous/irritable and Vision changes Patient denies hyperglycemic symptoms, including blurry vision, excessive thirst, fatigue, polyuria and weakness How often are you checking your blood sugar? once daily ( when she remember) What are your blood sugars ranging?  Fasting:  03.22 86 03.24 82 03.28 73 Before meals: n/a After meals: n/a Bedtime: n/a During the week, how often does your blood glucose drop below 70? Once a week Are you checking your feet daily/regularly?  Yes  Adherence Review: Is the patient currently on a STATIN medication? No Is the patient currently on ACE/ARB medication? No Does the patient have >5 day gap between last estimated fill dates? No  I spoke with the patient and discussed medication adherence. She does not have an issue currently with her medication. She states she checks her blood sugar around noon daily. She has not been writing her numbers down. She was encouraged to  get a notebook and record her numbers. She understood. She stated she has just begun physical therapy for her legs. She also needs to have a diabetic eye exam. Her last lab result shows that she was anemic. Her primary doctor placed her on iron 325 mg twice daily. She denies any ED visits since her last CPP follow-up. There have been no other changes to her medications.  A new CCM appointment was scheduled for May. The patient denies any side effects from her current medications. She also denies any problems with his current pharmacy.  Star Rating Drugs:  Dispensed Quantity Pharmacy  Metformin 500 mg 03.10.2022 [facility]    Amilia Revonda Standard, [place] [phone]

## 2020-09-09 DIAGNOSIS — K7469 Other cirrhosis of liver: Secondary | ICD-10-CM | POA: Diagnosis not present

## 2020-09-09 NOTE — Telephone Encounter (Signed)
Patient is aware 

## 2020-09-09 NOTE — Telephone Encounter (Signed)
Referrals to chiropractor are not medically necessary. She can see one without a referral if she would like.

## 2020-09-15 ENCOUNTER — Ambulatory Visit
Admission: RE | Admit: 2020-09-15 | Discharge: 2020-09-15 | Disposition: A | Discharge status: Home / Self Care | Payer: Medicare Other | Source: Ambulatory Visit | Attending: Physician Assistant | Admitting: Physician Assistant

## 2020-09-15 ENCOUNTER — Other Ambulatory Visit: Payer: Self-pay

## 2020-09-15 DIAGNOSIS — Z1231 Encounter for screening mammogram for malignant neoplasm of breast: Secondary | ICD-10-CM | POA: Diagnosis not present

## 2020-09-15 DIAGNOSIS — Z0000 Encounter for general adult medical examination without abnormal findings: Secondary | ICD-10-CM

## 2020-09-19 ENCOUNTER — Ambulatory Visit (HOSPITAL_COMMUNITY)
Admission: RE | Admit: 2020-09-19 | Discharge: 2020-09-19 | Disposition: A | Discharge status: Home / Self Care | Payer: Medicare Other | Source: Ambulatory Visit | Attending: Obstetrics and Gynecology | Admitting: Obstetrics and Gynecology

## 2020-09-19 ENCOUNTER — Other Ambulatory Visit: Payer: Self-pay

## 2020-09-19 DIAGNOSIS — N3289 Other specified disorders of bladder: Secondary | ICD-10-CM | POA: Diagnosis not present

## 2020-09-19 DIAGNOSIS — R339 Retention of urine, unspecified: Secondary | ICD-10-CM | POA: Diagnosis not present

## 2020-09-19 DIAGNOSIS — N318 Other neuromuscular dysfunction of bladder: Secondary | ICD-10-CM | POA: Insufficient documentation

## 2020-09-22 DIAGNOSIS — R339 Retention of urine, unspecified: Secondary | ICD-10-CM | POA: Diagnosis not present

## 2020-09-25 NOTE — Progress Notes (Signed)
Patient presented today for cystoscopy. POC urine revelated + leukocytes and nitrites. Patient states she is not having any increased urgency or dysuria. Will send for culture, treat for urinary tract infection with Bactrim DS BID x3 days and have her return for cystoscopy next week.   Deborah Folds, MD   Time spent: I spent 10 minutes dedicated to the care of this patient on the date of this encounter

## 2020-09-26 ENCOUNTER — Ambulatory Visit (INDEPENDENT_AMBULATORY_CARE_PROVIDER_SITE_OTHER): Payer: Medicare Other | Admitting: Obstetrics and Gynecology

## 2020-09-26 ENCOUNTER — Encounter: Payer: Self-pay | Admitting: Obstetrics and Gynecology

## 2020-09-26 ENCOUNTER — Other Ambulatory Visit: Payer: Self-pay

## 2020-09-26 VITALS — BP 187/77 | HR 79 | Ht 62.0 in | Wt 174.0 lb

## 2020-09-26 DIAGNOSIS — N3000 Acute cystitis without hematuria: Secondary | ICD-10-CM

## 2020-09-26 LAB — POCT URINALYSIS DIPSTICK
Appearance: ABNORMAL
Bilirubin, UA: NEGATIVE
Glucose, UA: NEGATIVE
Ketones, UA: NEGATIVE
Nitrite, UA: POSITIVE
Protein, UA: POSITIVE — AB
Spec Grav, UA: 1.02 (ref 1.010–1.025)
Urobilinogen, UA: 0.2 E.U./dL
pH, UA: 5.5 (ref 5.0–8.0)

## 2020-09-26 MED ORDER — SULFAMETHOXAZOLE-TRIMETHOPRIM 800-160 MG PO TABS: 1.0000 | ORAL_TABLET | Freq: Two times a day (BID) | ORAL | 0 refills | Status: AC

## 2020-09-26 NOTE — Patient Instructions (Signed)
Start taking antibiotics today and will have you return next week for cystoscopy.

## 2020-09-29 ENCOUNTER — Encounter: Payer: Self-pay | Admitting: Obstetrics and Gynecology

## 2020-09-29 ENCOUNTER — Other Ambulatory Visit: Payer: Self-pay

## 2020-09-29 ENCOUNTER — Ambulatory Visit (INDEPENDENT_AMBULATORY_CARE_PROVIDER_SITE_OTHER): Payer: Medicare Other | Admitting: Obstetrics and Gynecology

## 2020-09-29 VITALS — BP 199/84 | HR 93 | Ht 64.0 in | Wt 174.0 lb

## 2020-09-29 DIAGNOSIS — R3915 Urgency of urination: Secondary | ICD-10-CM | POA: Diagnosis not present

## 2020-09-29 DIAGNOSIS — N3289 Other specified disorders of bladder: Secondary | ICD-10-CM

## 2020-09-29 LAB — POCT URINALYSIS DIPSTICK
Appearance: NORMAL
Bilirubin, UA: NEGATIVE
Blood, UA: NEGATIVE
Glucose, UA: NEGATIVE
Ketones, UA: NEGATIVE
Leukocytes, UA: NEGATIVE
Nitrite, UA: NEGATIVE
Protein, UA: NEGATIVE
Spec Grav, UA: 1.02 (ref 1.010–1.025)
Urobilinogen, UA: 0.2 E.U./dL
pH, UA: 5 (ref 5.0–8.0)

## 2020-09-29 MED ORDER — PHENAZOPYRIDINE HCL 95 MG PO TABS: 95.0000 mg | ORAL_TABLET | Freq: Three times a day (TID) | ORAL | 1 refills | Status: DC | PRN

## 2020-09-29 MED ORDER — LIDOCAINE HCL URETHRAL/MUCOSAL 2 % EX GEL
1.0000 "application " | Freq: Once | CUTANEOUS | Status: AC
  Administered 2020-09-29: 1 via URETHRAL

## 2020-09-29 NOTE — Progress Notes (Signed)
CYSTOSCOPY  CC:  This is a 70 y.o. with bladder wall thickening and incomplete bladder emptying who presents today for cystoscopy.  Cysto was planned for last week but patient had positive UA. She was treated with bactrim DS BID x3 days. She has some burning with urination occasionally, but self catheterizes and this has been an issue before.   POC urine: negative  BP (!) 199/84 Comment: pt states she did not take her BP meds  Pulse 93   Ht 5\' 4"  (1.626 m)   Wt 174 lb (78.9 kg)   BMI 29.87 kg/m   CYSTOSCOPY: A time out was performed.  The periurethral area was prepped and draped in a sterile manner.  2% lidocaine jetpack was inserted at the urethral meatus. A 0-degree 83F cystoscope was inserted but could not be passed through the bladder neck. The scope was removed and attempted to pass the obturator but could not be inserted. A 12 F straight catheter was inserted slowly and the bladder was able to be drained. Attempted to pass the cystoscope again, and a sharp angle was noted at the bladder neck and scope could not be passed.    ASSESSMENT/ Plan:  Unable to assess bladder due to sharp angle at bladder neck and inability to insert cystoscope. Will plan for flexible cystoscopy either in the office if we are able to obtain a scope or in the operating room with sedation if needed.   Discussed with patient and she agreed with the plan. Also prescribed pyridium 95mg  up to TID prn for dysuria/ urgency. Still awaiting culture results but POC urine today was reassuring for clearing of infection.   [name], MD    Time spent: I spent 30 minutes dedicated to the care of this patient on the date of this encounter to include pre-visit review of records, face-to-face time with the patient and post visit documentation and ordering medication/ testing.

## 2020-09-29 NOTE — Patient Instructions (Signed)
Taking Care of Yourself after Urodynamics, Cystoscopy, Coaptite Injection, or Botox Injection    .   Drink plenty of water for a day or two following your procedure. Try to have about 8 ounces (one cup) at a time, and do this 6 times or more per day unless you have fluid restrictitons  .   AVOID irritative beverages such as coffee, tea, soda, alcoholic or citrus drinks for a day or two, as this may cause burning with urination.  . For the first 1-2 days after the procedure, your urine may be pink or red in color. You may have some blood in your urine as a normal side effect of the procedure. Large amounts of bleeding or difficulty urinating are NOT normal. Call the nurse line if this happens or go to the nearest Emergency Room if the bleeding is heavy .  [name] Kitchen  You may experience some discomfort or a burning sensation with urination after having this procedure. You can use over the counter Azo or pyridium to help with burning and follow the instructions on the packaging. If it does not improve within 1-2 days, or other symptoms appear (fever, chills, or difficulty urinating) call the office to speak to a nurse.   .   You may return to normal daily activities such as work, school, driving, exercising and housework on the day of the procedure.  .  If your doctor gave you a prescription, take it as ordered.   .   If you need a return appointment, the front desk staff will arrange it when you check out.

## 2020-09-29 NOTE — Addendum Note (Signed)
Addended by: Blenda Nicely on: 09/29/2020 12:28 PM   Modules accepted: Orders

## 2020-10-01 LAB — URINE CULTURE

## 2020-10-03 ENCOUNTER — Other Ambulatory Visit: Payer: Self-pay | Admitting: Adult Health

## 2020-10-05 ENCOUNTER — Other Ambulatory Visit: Payer: Self-pay | Admitting: Internal Medicine

## 2020-10-05 DIAGNOSIS — L2082 Flexural eczema: Secondary | ICD-10-CM

## 2020-10-06 ENCOUNTER — Telehealth: Payer: Self-pay | Admitting: Obstetrics and Gynecology

## 2020-10-06 NOTE — Telephone Encounter (Signed)
sch

## 2020-10-07 ENCOUNTER — Other Ambulatory Visit: Payer: Self-pay | Admitting: Internal Medicine

## 2020-10-07 ENCOUNTER — Telehealth: Payer: Self-pay | Admitting: Pharmacist

## 2020-10-07 DIAGNOSIS — I10 Essential (primary) hypertension: Secondary | ICD-10-CM

## 2020-10-07 NOTE — Progress Notes (Deleted)
 Chronic Care Management Pharmacy Assistant   Name: Masako L Balcerzak  MRN: [id number] DOB: 06/04/1951   Reason for Encounter: Medication Review/ Medication Coordination Call.     Recent office visits:  None.  Recent consult visits:  09/26/20 Michelle N. Schroeder MD( Obstetrics and Gynecology) - seen for acute cystitis without hematuria. Patient started on Sulfamethoxazole - Trimethoprim 800-160mg twice daily for 3 days. Follow up in 1 week for cystoscopy.   09/29/20 Michelle N. Schroeder MD( Obstetrics and Gynecology) - seen for urinary urgency. Patient started on  Phenazopyridine HCL 95mg three times daily as needed.    Hospital visits:  None in previous 6 months  Medications: Outpatient Encounter Medications as of 10/07/2020  Medication Sig  . Accu-Chek FastClix Lancets MISC 1 each by Does not apply route daily.  . Accu-Chek Softclix Lancets lancets USE AS DIRECTED TO CHECK BLOOD SUGAR TWICE A DAY  . amLODipine (NORVASC) 10 MG tablet TAKE ONE TABLET BY MOUTH EVERY MORNING FOR blood pressure  . ASPIRIN LOW DOSE 81 MG EC tablet TAKE 1 TABLET BY MOUTH EVERY DAY (Patient taking differently: Take 81 mg by mouth daily.)  . Blood Glucose Monitoring Suppl (ACCU-CHEK GUIDE) w/Device KIT Please specify directions, refills and quantity  . Catheters (SELF-CATH SYSTEM STRAIGHT TIP) KIT 1 each by Does not apply route as needed.  . Cholecalciferol 25 MCG (1000 UT) capsule Take 1,000 Units by mouth daily.   . Continuous Blood Gluc Receiver (FREESTYLE LIBRE 2 READER) DEVI 1 each by Does not apply route daily.  . Continuous Blood Gluc Sensor (FREESTYLE LIBRE 2 SENSOR) MISC 1 each by Does not apply route daily.  . cyanocobalamin 1000 MCG tablet Take 1,000 mcg by mouth daily.   . estradiol (ESTRACE) 0.1 MG/GM vaginal cream Place 0.5 g vaginally 2 (two) times a week. Place 0.5g nightly for two weeks then twice a week after  . ferrous sulfate 325 (65 FE) MG EC tablet Take 1 tablet (325 mg total) by  mouth in the morning and at bedtime.  . glucose blood (ACCU-CHEK GUIDE) test strip 1 each by Other route in the morning and at bedtime. Dx E11.9  . hydrochlorothiazide (HYDRODIURIL) 25 MG tablet TAKE ONE TABLET BY MOUTH EVERY MORNING FOR fluid/blood pressure  . lidocaine (XYLOCAINE) 2 % jelly Apply 1 application topically as needed.  . meloxicam (MOBIC) 15 MG tablet Take 1 tablet (15 mg total) by mouth daily. (Patient taking differently: Take 15 mg by mouth as needed.)  . metFORMIN (GLUCOPHAGE) 500 MG tablet Take 1 tablet (500 mg total) by mouth daily with breakfast.  . oxyCODONE (OXY IR/ROXICODONE) 5 MG immediate release tablet Take by mouth.  . phenazopyridine (PYRIDIUM) 95 MG tablet Take 1 tablet (95 mg total) by mouth 3 (three) times daily as needed for pain.  . REPATHA SURECLICK 140 MG/ML SOAJ INJECT 140mg into THE SKIN every 14 DAYS for cholesterol  . tamsulosin (FLOMAX) 0.4 MG CAPS capsule Take 1 capsule (0.4 mg total) by mouth daily.  . triamcinolone cream (KENALOG) 0.1 % APPLY TO THE AFFECTED AREA(S) TWICE DAILY FOR SKIN condition  . Trospium Chloride 60 MG CP24 Take 1 capsule (60 mg total) by mouth daily.   Facility-Administered Encounter Medications as of 10/07/2020  Medication  . sodium chloride flush (NS) 0.9 % injection 3 mL   Reviewed chart for medication changes ahead of medication coordination call.  No OVs, Consults, or hospital visits since last care coordination call/Pharmacist visit. (If appropriate, list visit date, provider name)    No medication changes indicated OR if recent visit, treatment plan here.  BP Readings from Last 3 Encounters:  09/29/20 (!) 199/84  09/26/20 (!) 187/77  09/03/20 (!) 179/76    Lab Results  Component Value Date   HGBA1C 6.3 08/15/2020     Patient obtains medications through Adherence Packaging  30 Days   Last adherence delivery included:   Amlodipine 10 mg: one tablet at breakfast  Aspirin 81 mg: one tablet at breakfast  Vitamin  D3 MCG 1000-unit: one capsule at breakfast  Vitamin B-12 Daily 1000 MCG: one tablet at breakfast  Repatha Sureclick 140 MG/ML Injection- Every 14 Days  Hydrochlorothiazide 25 mg: one tablet at breakfast  Metformin 500 mg: one tablet at breakfast  Triamcinolone cream (KENALOG) 0.1 %: apply to affected areas twice daily  Trospium Chloride 60 mg Oral Daily (in vial)  Tamsulosin HCl 0.4 mg: one capsule at breakfast  Ferrous sulfate 325 (65 FE) MG EC tablet: one tablet at breakfast and one tablet at bedtime  Estradiol 0.5 g Vaginal 2 times weekly, Place 0.5g nightly for two weeks then twice a week after   Patient declined (meds) last month due to additional supply on hand.  ACCU- Check Guide test strip  ACCU- Check softclix lancets  Oxycodone (OXY IR/ROXICODONE) 5 MG immediate release tablet -PRN use   Patient is due for next adherence delivery on: 10/16/20. Called patient and reviewed medications and coordinated delivery.  This delivery to include:   Amlodipine 10 mg: one tablet at breakfast  Aspirin 81 mg: one tablet at breakfast  Vitamin D3 MCG 1000-unit: one capsule at breakfast  Vitamin B-12 Daily 1000 MCG: one tablet at breakfast  Repatha Sureclick 140 MG/ML Injection- Every 14 Days  Hydrochlorothiazide 25 mg: one tablet at breakfast  Metformin 500 mg: one tablet at breakfast  Triamcinolone cream (KENALOG) 0.1 %: apply to affected areas twice daily  Trospium Chloride 60 mg Oral Daily (in vial)  Tamsulosin HCl 0.4 mg: one capsule at breakfast  Ferrous sulfate 325 (65 FE) MG EC tablet: one tablet at breakfast and one tablet at bedtime  Estradiol 0.5 g Vaginal 2 times weekly, Place 0.5g nightly for two weeks then twice a week after  No short fill or acute fill needed.   Patient declined the following medications due to oversupply on hand:  ACCU- Check Guide test strip  ACCU- Check softclix lancets  Oxycodone (OXY IR/ROXICODONE) 5 MG immediate release  tablet -PRN use  Patient needs refills for Metformin, Repatha, HCTZ, Amlodipine, and Triamcinolone cream.  Confirmed delivery date of 10/16/20, advised patient that pharmacy will contact them the morning of delivery.  Star Rating Drugs:  Chelsie Lowe CMA  [facility] [phone]  

## 2020-10-07 NOTE — Chronic Care Management (AMB) (Signed)
Chronic Care Management Pharmacy Assistant   Name: Deborah Wise  MRN: [id number] DOB: 1951/01/03   Reason for Encounter: Medication Review/ Medication Coordination Call.     Recent office visits:  None.  Recent consult visits:  09/26/20 [name] MD( Obstetrics and Gynecology) - seen for acute cystitis without hematuria. Patient started on Sulfamethoxazole - Trimethoprim 800-116m twice daily for 3 days. Follow up in 1 week for cystoscopy.   09/29/20 MJaquita FoldsMD( Obstetrics and Gynecology) - seen for urinary urgency. Patient started on  Phenazopyridine HCL 935mthree times daily as needed.    Hospital visits:  None in previous 6 months  Medications: Outpatient Encounter Medications as of 10/07/2020  Medication Sig  . Accu-Chek FastClix Lancets MISC 1 each by Does not apply route daily.  . Accu-Chek Softclix Lancets lancets USE AS DIRECTED TO CHECK BLOOD SUGAR TWICE A DAY  . amLODipine (NORVASC) 10 MG tablet TAKE ONE TABLET BY MOUTH EVERY MORNING FOR blood pressure  . ASPIRIN LOW DOSE 81 MG EC tablet TAKE 1 TABLET BY MOUTH EVERY DAY (Patient taking differently: Take 81 mg by mouth daily.)  . Blood Glucose Monitoring Suppl (ACCU-CHEK GUIDE) w/Device KIT Please specify directions, refills and quantity  . Catheters (SELF-CATH SYSTEM STRAIGHT TIP) KIT 1 each by Does not apply route as needed.  . Cholecalciferol 25 MCG (1000 UT) capsule Take 1,000 Units by mouth daily.   . Continuous Blood Gluc Receiver (FREESTYLE LIBRE 2 READER) DEVI 1 each by Does not apply route daily.  . Continuous Blood Gluc Sensor (FREESTYLE LIBRE 2 SENSOR) MISC 1 each by Does not apply route daily.  . cyanocobalamin 1000 MCG tablet Take 1,000 mcg by mouth daily.   . [name] Kitchenstradiol (ESTRACE) 0.1 MG/GM vaginal cream Place 0.5 g vaginally 2 (two) times a week. Place 0.5g nightly for two weeks then twice a week after  . ferrous sulfate 325 (65 FE) MG EC tablet Take 1 tablet (325 mg total) by  mouth in the morning and at bedtime.  . [name] Kitchenlucose blood (ACCU-CHEK GUIDE) test strip 1 each by Other route in the morning and at bedtime. Dx E11.9  . hydrochlorothiazide (HYDRODIURIL) 25 MG tablet TAKE ONE TABLET BY MOUTH EVERY MORNING FOR fluid/blood pressure  . lidocaine (XYLOCAINE) 2 % jelly Apply 1 application topically as needed.  . meloxicam (MOBIC) 15 MG tablet Take 1 tablet (15 mg total) by mouth daily. (Patient taking differently: Take 15 mg by mouth as needed.)  . metFORMIN (GLUCOPHAGE) 500 MG tablet Take 1 tablet (500 mg total) by mouth daily with breakfast.  . oxyCODONE (OXY IR/ROXICODONE) 5 MG immediate release tablet Take by mouth.  . phenazopyridine (PYRIDIUM) 95 MG tablet Take 1 tablet (95 mg total) by mouth 3 (three) times daily as needed for pain.  . [name] KitchenEPATHA SURECLICK 14071G/ML SOAJ INJECT 14060mnto THE SKIN every 14 DAYS for cholesterol  . tamsulosin (FLOMAX) 0.4 MG CAPS capsule Take 1 capsule (0.4 mg total) by mouth daily.  . tMarland Kitcheniamcinolone cream (KENALOG) 0.1 % APPLY TO THE AFFECTED AREA(S) TWICE DAILY FOR SKIN condition  . Trospium Chloride 60 MG CP24 Take 1 capsule (60 mg total) by mouth daily.   Facility-Administered Encounter Medications as of 10/07/2020  Medication  . sodium chloride flush (NS) 0.9 % injection 3 mL   Reviewed chart for medication changes ahead of medication coordination call.  No OVs, Consults, or hospital visits since last care coordination call/Pharmacist visit. (If appropriate, list visit date, provider name)  No medication changes indicated OR if recent visit, treatment plan here.  BP Readings from Last 3 Encounters:  09/29/20 (!) 199/84  09/26/20 (!) 187/77  09/03/20 (!) 179/76    Lab Results  Component Value Date   HGBA1C 6.3 08/15/2020     Patient obtains medications through Adherence Packaging  30 Days   Last adherence delivery included:   Amlodipine 10 mg: one tablet at breakfast  Aspirin 81 mg: one tablet at breakfast  Vitamin  D3 MCG 1000-unit: one capsule at breakfast  Vitamin B-12 Daily 1000 MCG: one tablet at breakfast  Repatha Sureclick 355 MG/ML Injection- Every 14 Days  Hydrochlorothiazide 25 mg: one tablet at breakfast  Metformin 500 mg: one tablet at breakfast  Triamcinolone cream (KENALOG) 0.1 %: apply to affected areas twice daily  Trospium Chloride 60 mg Oral Daily (in vial)  Tamsulosin HCl 0.4 mg: one capsule at breakfast  Ferrous sulfate 325 (65 FE) MG EC tablet: one tablet at breakfast and one tablet at bedtime  Estradiol 0.5 g Vaginal 2 times weekly, Place 0.5g nightly for two weeks then twice a week after   Patient declined (meds) last month due to additional supply on hand.  ACCU- Check Guide test strip  ACCU- Check softclix lancets  Oxycodone (OXY IR/ROXICODONE) 5 MG immediate release tablet -PRN use   Patient is due for next adherence delivery on: 10/16/20. Called patient and reviewed medications and coordinated delivery.  This delivery to include:   Amlodipine 10 mg: one tablet at breakfast  Aspirin 81 mg: one tablet at breakfast  Vitamin D3 MCG 1000-unit: one capsule at breakfast  Vitamin B-12 Daily 1000 MCG: one tablet at breakfast  Repatha Sureclick 732 MG/ML Injection- Every 14 Days  Hydrochlorothiazide 25 mg: one tablet at breakfast  Metformin 500 mg: one tablet at breakfast  Triamcinolone cream (KENALOG) 0.1 %: apply to affected areas twice daily  Trospium Chloride 60 mg Oral Daily (in vial)  Tamsulosin HCl 0.4 mg: one capsule at breakfast  Ferrous sulfate 325 (65 FE) MG EC tablet: one tablet at breakfast and one tablet at bedtime  Estradiol 0.5 g Vaginal 2 times weekly, Place 0.5g nightly for two weeks then twice a week after  No short fill or acute fill needed.   Patient declined the following medications due to oversupply on hand:  ACCU- Check Guide test strip  ACCU- Check softclix lancets  Oxycodone (OXY IR/ROXICODONE) 5 MG immediate release  tablet -PRN use  Patient needs refills for Metformin, Repatha, HCTZ, Amlodipine, and Triamcinolone cream.  Confirmed delivery date of 10/16/20, advised patient that pharmacy will contact them the morning of delivery.  [facility]:  [facility] [phone]

## 2020-10-08 ENCOUNTER — Other Ambulatory Visit: Payer: Self-pay

## 2020-10-08 ENCOUNTER — Ambulatory Visit (INDEPENDENT_AMBULATORY_CARE_PROVIDER_SITE_OTHER): Payer: Medicare Other | Admitting: *Deleted

## 2020-10-08 DIAGNOSIS — R300 Dysuria: Secondary | ICD-10-CM | POA: Diagnosis not present

## 2020-10-08 LAB — POCT URINALYSIS DIPSTICK
Appearance: NORMAL
Bilirubin, UA: NEGATIVE
Glucose, UA: NEGATIVE
Ketones, UA: NEGATIVE
Leukocytes, UA: NEGATIVE
Nitrite, UA: NEGATIVE
Protein, UA: NEGATIVE
Spec Grav, UA: 1.015 (ref 1.010–1.025)
Urobilinogen, UA: 0.2 E.U./dL
pH, UA: 5 (ref 5.0–8.0)

## 2020-10-14 NOTE — H&P (Signed)
HISTORY AND PHYSICAL  Deborah Wise is a 70 y.o. female patient with CC: Pain lower right jaw, upper left jaw.              No diagnosis found.  Past Medical History:  Diagnosis Date  . Anemia   . Arthritis   . Blood transfusion without reported diagnosis   . Carotid artery occlusion   . Cirrhosis ([place])   . Diabetes mellitus without complication ([place])   . Fibroid, uterine   . GERD (gastroesophageal reflux disease)   . Hepatitis    hep C  . Hypertension   . Stroke ([place])   . Thrombocytopenia ([place]) 12/25/2014  . Urinary incontinence    Patient noticed mild and is not currently a significant problem    Current Facility-Administered Medications  Medication Dose Route Frequency Provider Last Rate Last Admin  . sodium chloride flush (NS) 0.9 % injection 3 mL  3 mL Intravenous Q12H Jerline Pain, MD       Current Outpatient Medications  Medication Sig Dispense Refill  . amLODipine (NORVASC) 10 MG tablet TAKE ONE TABLET BY MOUTH EVERY MORNING FOR BLOOD PRESSURE (Patient taking differently: Take 10 mg by mouth daily.) 90 tablet 1  . ASPIRIN LOW DOSE 81 MG EC tablet TAKE 1 TABLET BY MOUTH EVERY DAY (Patient taking differently: Take 81 mg by mouth daily.) 90 tablet 1  . carboxymethylcellulose (REFRESH PLUS) 0.5 % SOLN Place 1 drop into both eyes daily.    . Cholecalciferol 25 MCG (1000 UT) capsule Take 1,000 Units by mouth daily.     . cyanocobalamin 1000 MCG tablet Take 1,000 mcg by mouth daily.     [name] Kitchen estradiol (ESTRACE) 0.1 MG/GM vaginal cream Place 0.5 g vaginally 2 (two) times a week. Place 0.5g nightly for two weeks then twice a week after (Patient taking differently: Place 0.5 g vaginally 2 (two) times a week.) 30 g 11  . ferrous sulfate 325 (65 FE) MG EC tablet Take 1 tablet (325 mg total) by mouth in the morning and at bedtime.  3  . hydrochlorothiazide (HYDRODIURIL) 25 MG tablet TAKE ONE TABLET BY MOUTH EVERY MORNING FOR fluid AND FOR BLOOD PRESSURE (Patient taking  differently: Take 25 mg by mouth daily.) 90 tablet 1  . lidocaine (XYLOCAINE) 2 % jelly Apply 1 application topically as needed. (Patient taking differently: Apply 1 application topically daily as needed (pain).) 30 mL 5  . meloxicam (MOBIC) 15 MG tablet Take 1 tablet (15 mg total) by mouth daily. (Patient taking differently: Take 15 mg by mouth daily as needed for pain.) 30 tablet 0  . metFORMIN (GLUCOPHAGE) 500 MG tablet TAKE ONE TABLET BY MOUTH EVERY MORNING FOR diabetes (Patient taking differently: Take 500 mg by mouth daily with breakfast.) 180 tablet 1  . REPATHA SURECLICK 975 MG/ML SOAJ INJECT 122m into THE SKIN every 14 DAYS FOR cholesterol (Patient taking differently: Inject 140 mg into the skin every 14 (fourteen) days.) 2 mL 11  . tamsulosin (FLOMAX) 0.4 MG CAPS capsule Take 1 capsule (0.4 mg total) by mouth daily. 30 capsule 5  . triamcinolone cream (KENALOG) 0.1 % APPLY TO THE AFFECTED AREA(S) TWICE DAILY FOR SKIN condition (Patient taking differently: Apply 1 application topically daily as needed (Dry skin).) 80 g 4  . Trospium Chloride 60 MG CP24 Take 1 capsule (60 mg total) by mouth daily. (Patient taking differently: Take 60 mg by mouth daily.) 30 capsule 5  . Accu-Chek FastClix Lancets MISC 1 each by  Does not apply route daily. 100 each 3  . Accu-Chek Softclix Lancets lancets USE AS DIRECTED TO CHECK BLOOD SUGAR TWICE A DAY 100 each 0  . Blood Glucose Monitoring Suppl (ACCU-CHEK GUIDE) w/Device KIT Please specify directions, refills and quantity 1 kit 0  . Catheters (SELF-CATH SYSTEM STRAIGHT TIP) KIT 1 each by Does not apply route as needed. 120 kit 11  . Continuous Blood Gluc Receiver (FREESTYLE LIBRE 2 READER) DEVI 1 each by Does not apply route daily. 2 each 11  . Continuous Blood Gluc Sensor (FREESTYLE LIBRE 2 SENSOR) MISC 1 each by Does not apply route daily. 2 each 11  . glucose blood (ACCU-CHEK GUIDE) test strip 1 each by Other route in the morning and at bedtime. Dx E11.9  100 strip 0  . phenazopyridine (PYRIDIUM) 95 MG tablet Take 1 tablet (95 mg total) by mouth 3 (three) times daily as needed for pain. (Patient not taking: Reported on 10/13/2020) 30 tablet 1   No Known Allergies Active Problems:   * No active hospital problems. *  Vitals: There were no vitals taken for this visit. Lab results:No results found for this or any previous visit (from the past 17 hour(s)). Radiology Results: No results found. General appearance: alert, cooperative and moderate distress Head: Normocephalic, without obvious abnormality, atraumatic Eyes: negative Nose: Nares normal. Septum midline. Mucosa normal. No drainage or sinus tenderness. Throat:  Edentulous maxilla with bulbous pink tissue left anterior. Tooth #11 exposed slightly on palatal of anterior maxilla. Lower teeth 21, 22, 28 and 30 only lower teeth remaining. Decay #22, 28. [place]il #30. No purulence, edema, fluctuance, trismus. Oral cancer screening negative. Pharynx clear. No lymphadenopathy. 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 decay.bone loss #21, 22, 28, 30.  Assessment:Assessment: ASA 3. Impacted #11 slightly erupted from denture rubbing. Non-restorable  teeth # 21, 22, 28, 30             Plan: 1. MD clearance  2. Extract # 11, 21, 22, 28, 30, alveoloplasty.   Hospital Day surgery.      Diona Browner 10/14/2020

## 2020-10-16 ENCOUNTER — Telehealth: Payer: Self-pay | Admitting: Pharmacist

## 2020-10-16 NOTE — Chronic Care Management (AMB) (Signed)
    Chronic Care Management Pharmacy Assistant   Name: ALEJANDRA HUNT  MRN: [id number] DOB: [birth year]  Spoke with patient on phone and reminded her of her appointment on 10/17/20 at 12pm by phone with Jeni Salles the clinical pharmacist. Advised patient to have all medications and any blood pressure or blood glucose readings available for review. Patient verbalized understanding. Patient thanked me for my call.  [place] [phone]

## 2020-10-17 ENCOUNTER — Encounter (HOSPITAL_COMMUNITY): Payer: Self-pay | Admitting: Oral Surgery

## 2020-10-17 ENCOUNTER — Ambulatory Visit (INDEPENDENT_AMBULATORY_CARE_PROVIDER_SITE_OTHER): Payer: Medicare Other | Admitting: Pharmacist

## 2020-10-17 ENCOUNTER — Other Ambulatory Visit (HOSPITAL_COMMUNITY)
Admission: RE | Admit: 2020-10-17 | Discharge: 2020-10-17 | Disposition: A | Discharge status: Home / Self Care | Payer: Medicare Other | Source: Ambulatory Visit | Attending: Oral Surgery | Admitting: Oral Surgery

## 2020-10-17 ENCOUNTER — Other Ambulatory Visit: Payer: Self-pay

## 2020-10-17 DIAGNOSIS — Z20822 Contact with and (suspected) exposure to covid-19: Secondary | ICD-10-CM | POA: Insufficient documentation

## 2020-10-17 DIAGNOSIS — E1169 Type 2 diabetes mellitus with other specified complication: Secondary | ICD-10-CM | POA: Diagnosis not present

## 2020-10-17 DIAGNOSIS — E785 Hyperlipidemia, unspecified: Secondary | ICD-10-CM

## 2020-10-17 DIAGNOSIS — E1149 Type 2 diabetes mellitus with other diabetic neurological complication: Secondary | ICD-10-CM | POA: Diagnosis not present

## 2020-10-17 DIAGNOSIS — Z01812 Encounter for preprocedural laboratory examination: Secondary | ICD-10-CM | POA: Diagnosis not present

## 2020-10-17 DIAGNOSIS — I10 Essential (primary) hypertension: Secondary | ICD-10-CM | POA: Diagnosis not present

## 2020-10-17 DIAGNOSIS — K7030 Alcoholic cirrhosis of liver without ascites: Secondary | ICD-10-CM

## 2020-10-17 NOTE — Progress Notes (Addendum)
Deborah Wise denies chest pain or shortness of breath. Patient denies any s/s of Covid in her home and has not been in contact with anyone who has s/s. Deborah Wise is going to have Covid test done this am. I spoke with patient that she will need to stay home, no one that doesn't live with her should come in this weekend.  I asked Deborah Wise if she has blockages in her heart, patient said no, I reminded patient of the cardiac catheterization she had 03/2020- patient said she has so many test that she did not know what they were for.  Deborah Wise has cardiac clearance  [facility]'s note also states that patient may hold ASA 3 -5 days prior to surgery.  Deborah Wise stated that he had stopped ASA, but she forgot and took it today, I instructed patient to not take it again, until Dr. Hoyt Koch gives you permission.  Deborah Wise has type II diabetics, patient rarely checks CBG.  Last A1C was 6.3 on 08/15/20.  I instructed patient to not take Metformin on Monday am. I instructed patient to check CBG after awaking and every 2 hours until arrival  to the hospital.  I Instructed patient if CBG is less than 70 to drink  1/2 cup of a clear juice, apple or cranberry Recheck CBG in 15 minutes if CBG is not over 70 call, pre- op desk at [phone] for further instructions.  I asked anesthesia PA-C to review chart.

## 2020-10-17 NOTE — Progress Notes (Signed)
Chronic Care Management Pharmacy Note  11/04/2020 Name:  Deborah Wise MRN:  [id number] DOB:  January 24, 1951  Subjective: Deborah Wise is an 70 y.o. year old female who is a primary patient of Isaac Bliss, Rayford Halsted, MD.  The CCM team was consulted for assistance with disease management and care coordination needs.    Engaged with patient by telephone for follow up visit in response to provider referral for pharmacy case management and/or care coordination services.   Consent to Services:  The patient was given information about Chronic Care Management services, agreed to services, and gave verbal consent prior to initiation of services.  Please see initial visit note for detailed documentation.   Patient Care Team: Isaac Bliss, Rayford Halsted, MD as PCP - General (Internal Medicine) Viona Gilmore, [facility] as Pharmacist (Pharmacist)  Recent office visits: 08/15/2020 [name], MD Internal Medicine. Presented for pre-operative clearance. Prescribed ferrous sulfate 235 mg: one tablet at breakfast and at bedtime.  07/17/20 [name], MD: Presented for DM follow up. A1c increased to 6.1%. Ordered CGM.   Recent consult visits: 09/29/20 [name] MD( Obstetrics and Gynecology) - seen for urinary urgency. Patient started on  Phenazopyridine HCL 65m three times daily as needed.  09/26/20 MJaquita FoldsMD (Obstetrics and Gynecology) - seen for acute cystitis without hematuria. Patient started on Sulfamethoxazole - Trimethoprim 800-1669mtwice daily for 3 days. Follow up in 1 week for cystoscopy.  09/03/20 ScJaquita FoldsMD Obstetrics and Gynecology. Prescribed lidocaine HCl 2 % as needed.  08/21/20 McFrann RiderNP Neurology   Hospital visits: None in previous 6 months  Objective:  Lab Results  Component Value Date   CREATININE 0.75 10/20/2020   BUN 11 10/20/2020   GFR 85.84 08/15/2020   GFRNONAA >60 10/20/2020    GFRAA 84 03/18/2020   NA 139 10/20/2020   K 3.7 10/20/2020   CALCIUM 9.7 10/20/2020   CO2 24 10/20/2020   GLUCOSE 120 (H) 10/20/2020    Lab Results  Component Value Date/Time   HGBA1C 6.3 08/15/2020 08:12 AM   HGBA1C 6.1 (A) 07/17/2020 02:19 PM   HGBA1C 5.5 02/20/2020 08:22 AM   GFR 85.84 08/15/2020 08:12 AM   GFR 86.68 09/07/2018 01:21 PM   MICROALBUR 52.8 (H) 06/22/2018 09:01 AM    Last diabetic Eye exam: No results found for: HMDIABEYEEXA  Last diabetic Foot exam: No results found for: HMDIABFOOTEX   Lab Results  Component Value Date   CHOL 147 08/15/2020   HDL 61.60 08/15/2020   LDLCALC 64 08/15/2020   TRIG 108.0 08/15/2020   CHOLHDL 2 08/15/2020    Hepatic Function Latest Ref Rng & Units 08/15/2020 02/14/2019 02/06/2019  Total Protein 6.0 - 8.3 g/dL 7.7 7.1 7.8  Albumin 3.5 - 5.2 g/dL 4.2 4.4 4.0  AST 0 - 37 U/L 37 38 77(H)  ALT 0 - 35 U/L 28 32 47(H)  Alk Phosphatase 39 - 117 U/L 79 100 75  Total Bilirubin 0.2 - 1.2 mg/dL 1.1 1.1 1.7(H)  Bilirubin, Direct 0.00 - 0.40 mg/dL - 0.53(H) -    Lab Results  Component Value Date/Time   TSH 3.314 06/28/2016 07:24 AM   TSH 2.038 07/21/2013 07:20 AM    CBC Latest Ref Rng & Units 10/20/2020 08/15/2020 03/18/2020  WBC 4.0 - 10.5 K/uL 7.2 6.6 6.2  Hemoglobin 12.0 - 15.0 g/dL 11.9(L) 11.5(L) 12.1  Hematocrit 36.0 - 46.0 % 37.9 35.6(L) 37.4  Platelets 150 - 400 K/uL 129(L)  139.0(L) 137(L)    No results found for: VD25OH  Clinical ASCVD: Yes  The ASCVD Risk score Mikey Bussing DC Jr., et al., 2013) failed to calculate for the following reasons:   The patient has a prior MI or stroke diagnosis    Depression screen [name] 2/9 01/03/2020 12/22/2018 08/03/2018  Decreased Interest 0 0 0  Down, Depressed, Hopeless 1 1 0  PHQ - 2 Score 1 1 0  Altered sleeping _0 Tired, decreased energy 1 2 0  Change in appetite 1 2 0  Feeling bad or failure about yourself  0 0 0  Trouble concentrating - 0 0  Moving slowly or fidgety/restless 0 0 0   Suicidal thoughts 0 0 0  PHQ-9 Score _1 Difficult doing work/chores Not difficult at all Not difficult at all Not difficult at all  Some recent data might be hidden      Social History   Tobacco Use  Smoking Status Never Smoker  Smokeless Tobacco Never Used   BP Readings from Last 3 Encounters:  10/20/20 (!) 173/75  09/29/20 (!) 199/84  09/26/20 (!) 187/77   Pulse Readings from Last 3 Encounters:  10/20/20 99  09/29/20 93  09/26/20 79   Wt Readings from Last 3 Encounters:  10/20/20 172 lb (78 kg)  09/29/20 174 lb (78.9 kg)  09/26/20 174 lb (78.9 kg)   BMI Readings from Last 3 Encounters:  10/20/20 29.52 kg/m  09/29/20 29.87 kg/m  09/26/20 31.83 kg/m    Assessment/Interventions: Review of patient past medical history, allergies, medications, health status, including review of consultants reports, laboratory and other test data, was performed as part of comprehensive evaluation and provision of chronic care management services.   SDOH:  (Social Determinants of Health) assessments and interventions performed: No  SDOH Screenings   Alcohol Screen: Not on file  Depression (PHQ2-9): Low Risk   . PHQ-2 Score: 4  Financial Resource Strain: Low Risk   . Difficulty of Paying Living Expenses: Not hard at all  Food Insecurity: Not on file  Housing: Not on file  Physical Activity: Not on file  Social Connections: Not on file  Stress: Not on file  Tobacco Use: Low Risk   . Smoking Tobacco Use: Never Smoker  . Smokeless Tobacco Use: Never Used  Transportation Needs: No Transportation Needs  . Lack of Transportation (Medical): No  . Lack of Transportation (Non-Medical): No    CCM Care Plan  No Known Allergies  Medications Reviewed Today    Reviewed by Desma Paganini, RN (Registered Nurse) on 10/20/20 at 1255  Med List Status: Complete  Medication Order Taking? Sig Documenting Provider Last Dose Status Informant  Accu-Chek FastClix Lancets MISC [id number]   1 each by Does not apply route daily. Isaac Bliss, Rayford Halsted, MD  Active Self  Accu-Chek Softclix Lancets lancets [id number]  USE AS DIRECTED TO CHECK BLOOD SUGAR TWICE A DAY Isaac Bliss, Rayford Halsted, MD  Active Self  amLODipine (NORVASC) 10 MG tablet [id number] Yes TAKE ONE TABLET BY MOUTH EVERY MORNING FOR BLOOD PRESSURE  Patient taking differently: Take 10 mg by mouth daily.   Isaac Bliss, Rayford Halsted, MD 10/20/2020 0830 Active   ASPIRIN LOW DOSE 81 MG EC tablet [id number] Yes TAKE 1 TABLET BY MOUTH EVERY DAY  Patient taking differently: Take 81 mg by mouth daily.   Isaac Bliss, Rayford Halsted, MD Past Week 0900 Active   Blood Glucose Monitoring Suppl (ACCU-CHEK GUIDE) w/Device KIT [id number]  Please specify directions, refills and quantity Isaac Bliss, Rayford Halsted, MD  Active Self  carboxymethylcellulose (REFRESH PLUS) 0.5 % SOLN [id number] Yes Place 1 drop into both eyes daily. [provider] Past Week 1200 Active Self  Catheters (SELF-CATH SYSTEM STRAIGHT TIP) KIT [id number]  1 each by Does not apply route as needed. [name], MD  Active Self  Cholecalciferol 25 MCG (1000 UT) capsule [id number] No Take 1,000 Units by mouth daily.  [provider] More than a month Unknown time Active Self  Continuous Blood Gluc Receiver (FREESTYLE LIBRE 2 READER) DEVI [id number]  1 each by Does not apply route daily. Isaac Bliss, Rayford Halsted, MD  Active Self  Continuous Blood Gluc Sensor (FREESTYLE LIBRE 2 SENSOR) [place] [id number]  1 each by Does not apply route daily. Isaac Bliss, Rayford Halsted, MD  Active Self  cyanocobalamin 1000 MCG tablet [id number] Yes Take 1,000 mcg by mouth daily.  [provider] 10/19/2020 1000 Active Self  estradiol (ESTRACE) 0.1 MG/GM vaginal cream [id number] Yes Place 0.5 g vaginally 2 (two) times a week. Place 0.5g nightly for two weeks then twice a week after  Patient taking differently: Place 0.5 g vaginally 2 (two) times a week.    [name], MD 10/19/2020 2100 Active Self  ferrous sulfate 325 (65 FE) MG EC tablet [id number] Yes Take 1 tablet (325 mg total) by mouth in the morning and at bedtime. Isaac Bliss, Rayford Halsted, MD 10/19/2020 0900 Active Self  glucose blood (ACCU-CHEK GUIDE) test strip [id number]  1 each by Other route in the morning and at bedtime. Dx E11.9 Isaac Bliss, Rayford Halsted, MD  Active Self  hydrochlorothiazide (HYDRODIURIL) 25 MG tablet [id number] Yes TAKE ONE TABLET BY MOUTH EVERY MORNING FOR fluid AND FOR BLOOD PRESSURE  Patient taking differently: Take 25 mg by mouth daily.   Isaac Bliss, Rayford Halsted, MD 10/19/2020 0900 Active   lidocaine (XYLOCAINE) 2 % jelly [id number] No Apply 1 application topically as needed.  Patient taking differently: Apply 1 application topically daily as needed (pain).   [name], MD Unknown Unknown time Active Self  meloxicam (MOBIC) 15 MG tablet [id number] Yes Take 1 tablet (15 mg total) by mouth daily.  Patient taking differently: Take 15 mg by mouth daily as needed for pain.   Criselda Peaches, DPM  Consider Medication Status and Discontinue Self  metFORMIN (GLUCOPHAGE) 500 MG tablet [id number] Yes TAKE ONE TABLET BY MOUTH EVERY MORNING FOR diabetes  Patient taking differently: Take 500 mg by mouth daily with breakfast.   Isaac Bliss, Rayford Halsted, MD 10/19/2020 0900 Active   phenazopyridine (PYRIDIUM) 95 MG tablet [id number] No Take 1 tablet (95 mg total) by mouth 3 (three) times daily as needed for pain.  Patient not taking: Reported on 10/13/2020   [name], MD Not Taking Unknown time Consider Medication Status and Discontinue (Completed Course) Self  REPATHA SURECLICK 572 MG/ML SOAJ [id number] Yes INJECT 172m into THE SKIN every 14 DAYS FOR cholesterol  Patient taking differently: Inject 140 mg into the skin every 14 (fourteen) days.   MFrann Rider NP Past Week 1200 Active   sodium chloride flush (NS) 0.9 % injection 3 mL [id number]   SJerline Pain MD  Active   tamsulosin (FLOMAX) 0.4 MG CAPS capsule [id number] Take 1 capsule (0.4 mg total) by mouth daily. SJaquita Folds MD Unknown Unknown time Active Self  triamcinolone cream (KENALOG) 0.1 % [id number] APPLY TO THE AFFECTED AREA(S) TWICE  DAILY FOR SKIN condition  Patient taking differently: Apply 1 application topically daily as needed (Dry skin).   Isaac Bliss, Rayford Halsted, MD  Active Self  Trospium Chloride 60 MG CP24 [id number] Yes Take 1 capsule (60 mg total) by mouth daily.  Patient taking differently: Take 60 mg by mouth daily.   [name], MD 10/19/2020 1200 Active Self          Patient Active Problem List   Diagnosis Date Noted  . Chronic hepatitis C ([place]) 07/02/2020  . Personal history of colonic polyps 07/02/2020  . Fracture of metacarpal bone 05/15/2020  . Peripheral arterial disease ([place]) 04/15/2020  . Coronary artery disease   . Chronic diastolic CHF (congestive heart failure) ([place]) 03/12/2020  . Dysuria 03/01/2020  . Chronic arthropathy 11/07/2019  . Hallux limitus of right foot 11/07/2019  . Diabetic neuropathy ([place]) 04/04/2019  . Cirrhosis of liver ([place]) 06/22/2018  . Chronic hepatitis ([place]) 06/22/2018  . Hyperlipidemia associated with type 2 diabetes mellitus ([place]) 06/22/2018  . Carotid artery disease ([place]) 06/22/2018  . 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[place]) 07/02/2016  . Aneurysm of middle cerebral artery   . Cerebrovascular accident (CVA) due to thrombosis of left vertebral artery ([place]) 06/28/2016  . Cocaine abuse ([place])   . Stroke (cerebrum) ([place]) 06/27/2016  . Microcytic anemia 12/27/2014  . Thrombocytopenia ([place]) 12/25/2014  . Hypoglycemia 07/21/2013  . Diabetes mellitus ([place]) 07/21/2013  . 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 Liver disease 07/21/2013  . Alcohol abuse 07/21/2013    Immunization History  Administered Date(s) Administered  . Influenza, High Dose Seasonal PF 06/22/2018  .  Influenza,inj,Quad PF,6+ Mos 06/29/2016  . Moderna SARS-COV2 Booster Vaccination 05/28/2020  . PFIZER(Purple Top)SARS-COV-2 Vaccination 09/03/2019, 10/04/2019  . Td 08/03/2017    Conditions to be addressed/monitored:  Hypertension, Hyperlipidemia, Diabetes, Overactive Bladder and Cirrhosis, Neuropathy  Care Plan : [place]  Updates made by Viona Gilmore, [place] since 11/04/2020 12:00 AM    Problem: Problem: Hypertension, Hyperlipidemia, Diabetes, Overactive Bladder and Cirrhosis, Neuropathy     Long-Range Goal: Patient-Specific Goal   Start Date: 10/17/2020  Expected End Date: 10/17/2021  This Visit's Progress: On track  Priority: High  Note:   Current Barriers:  . Unable to independently monitor therapeutic efficacy . Unable to achieve control of blood pressure   Pharmacist Clinical Goal(s):  [name] Kitchen Patient will achieve adherence to monitoring guidelines and medication adherence to achieve therapeutic efficacy . achieve control of blood pressure as evidenced by home readings  through collaboration with PharmD and provider.   Interventions: . 1:1 collaboration with Isaac Bliss, Rayford Halsted, MD regarding development and update of comprehensive plan of care as evidenced by provider attestation and co-signature . Inter-disciplinary care team collaboration (see longitudinal plan of care) . Comprehensive medication review performed; medication list updated in electronic medical record  Hypertension (BP goal <130/80) -Uncontrolled -Current treatment:  Amlodipine 10 mg daily   HCTZ 25 mg daily   Lisinopril 20 mg daily   Hydralazine 25 mg daily  -Medications previously tried: n/a  -Current home readings: does not check at home -Current dietary habits: did not discuss -Current exercise habits: did not discuss -Denies hypotensive/hypertensive symptoms -Educated on Daily salt intake goal < 2300 mg; Exercise goal of 150 minutes per week; Importance of home blood  pressure monitoring; Proper BP monitoring technique; -Counseled to monitor BP at home weekly, document, and provide log at future appointments -Counseled on diet and exercise extensively Recommended to continue current medication  Hyperlipidemia/history of stroke: (LDL goal < 70) -Controlled -Current treatment:  Aspirin 81 mg daily   Repatha 140 mg every 14 days - get its free (uses a calendar)  -Medications previously tried: atorvastatin (cirrhosis)  -Current dietary patterns: did not discuss -Current exercise habits: did not discuss -Educated on Cholesterol goals;  Benefits of statin for ASCVD risk reduction; Importance of limiting foods high in cholesterol; -Counseled on diet and exercise extensively Recommended to continue current medication  Diabetes (A1c goal <7%) -Controlled -Current medications: [name] Kitchen Metformin 500 mg 1 tablet daily  -Medications previously tried: none  -Current home glucose readings . fasting glucose: not checking every day; checks in mid afternoon when she thinks about it . post prandial glucose: n/a -Denies hypoglycemic/hyperglycemic symptoms -Current meal patterns:  . breakfast: did not discuss . lunch: did not discuss  . dinner: did not discuss . snacks: did not discuss . drinks: did not discuss -Current exercise: did not discuss -Educated on A1c and blood sugar goals; Benefits of routine self-monitoring of blood sugar; Continuous glucose monitoring; Carbohydrate counting and/or plate method -Counseled to check feet daily and get yearly eye exams -Counseled on diet and exercise extensively Recommended to continue current medication  Chronic pain (Goal: minimize symptoms of pain) -Not ideally controlled -Current treatment   Meloxicam 7.5 mg daily PRN (1-2x weekly)  Meloxicam 15 mg daily PRN (Not taking)  Oxycodone 4 mg PRN (Not taking)  -Medications previously tried: n/a  -Counseled on  avoiding taking meloxicam doses together and to  take with plenty of water and with a snack/meal due to DDI with aspirin.  Overactive bladder (Goal: minimize symptoms) -Not ideally controlled -Current treatment  . Trospium daily . Tamsulosin 0.4 mg daily -Medications previously tried: none  -Recommended to continue current medication   Health Maintenance -Vaccine gaps: pneumonia, shingles, COVID booster -Current therapy:   Vitamin D 2000 units daily   Vitamin B12 1000 mcg daily  -Educated on Cost vs benefit of each product must be carefully weighed by individual consumer -Patient is satisfied with current therapy and denies issues -Recommended to continue current medication  Patient Goals/Self-Care Activities . Patient will:  - take medications as prescribed focus on medication adherence by setting an alarm check glucose a few times a week, document, and provide at future appointments check blood pressure a few times a week, document, and provide at future appointments target a minimum of 150 minutes of moderate intensity exercise weekly  Follow Up Plan: Telephone follow up appointment with care management team member scheduled for: 4 months        Medication Assistance: None required.  Patient affirms current coverage meets needs.  Patient's preferred pharmacy is:  [facility] - [place], [place] - [address] [place] [address] Phone: [phone] Fax: [phone]  [place], [place] [address] Phone: [phone] Fax: [phone]  Uses pill box? No - adherence packaging Pt endorses 90% compliance  We discussed: Benefits of medication synchronization, packaging and delivery as well as enhanced pharmacist oversight with [facility]. Patient decided to: Utilize [facility] pharmacy for medication synchronization, packaging and delivery  Care Plan and Follow Up Patient  Decision:  Patient agrees to Care Plan and Follow-up.  Plan: Telephone follow up appointment with care management team member scheduled for:  4 months  Jeni Salles, PharmD [place] Pharmacist [place] [facility] [place] [phone]

## 2020-10-17 NOTE — Anesthesia Preprocedure Evaluation (Addendum)
Anesthesia Evaluation  Patient identified by MRN, date of birth, ID band Patient awake    Reviewed: Allergy & Precautions, NPO status , Patient's Chart, lab work & pertinent test results  Airway Mallampati: II  TM Distance: >3 FB Neck ROM: Full    Dental  (+) Poor Dentition, Missing   Pulmonary neg pulmonary ROS,    Pulmonary exam normal        Cardiovascular hypertension, Pt. on medications + CAD, + Peripheral Vascular Disease and +CHF   Rhythm:Regular Rate:Normal     Neuro/Psych CVA (2018) negative psych ROS   GI/Hepatic GERD  ,(+) Hepatitis -, C  Endo/Other  diabetes  Renal/GU negative Renal ROS  negative genitourinary   Musculoskeletal  (+) Arthritis ,   Abdominal (+)  Abdomen: soft.    Peds  Hematology  (+) anemia ,   Anesthesia Other Findings   Reproductive/Obstetrics                            Anesthesia Physical Anesthesia Plan  ASA: III  Anesthesia Plan: General   Post-op Pain Management:    Induction: Intravenous  PONV Risk Score and Plan: 3 and Ondansetron, Dexamethasone, Midazolam and Treatment may vary due to age or medical condition  Airway Management Planned: Mask and Oral ETT  Additional Equipment: None  Intra-op Plan:   Post-operative Plan: Extubation in OR  Informed Consent: I have reviewed the patients History and Physical, chart, labs and discussed the procedure including the risks, benefits and alternatives for the proposed anesthesia with the patient or authorized representative who has indicated his/her understanding and acceptance.     Dental advisory given  Plan Discussed with: CRNA  Anesthesia Plan Comments: (PAT note written 10/17/2020 by Myra Gianotti, PA-C.  Echo 03/12/20: IMPRESSIONS  1. Left ventricular ejection fraction, by estimation, is 60 to 65%. The  left ventricle has normal function. The left ventricle has no regional  wall  motion abnormalities. Left ventricular diastolic parameters are  consistent with Grade I diastolic  dysfunction (impaired relaxation).  2. Right ventricular systolic function is normal. The right ventricular  size is normal. There is normal pulmonary artery systolic pressure. The  estimated right ventricular systolic pressure is 82.4 mmHg.  3. Left atrial size was mildly dilated.  4. The mitral valve is degenerative. No evidence of mitral valve  regurgitation. No evidence of mitral stenosis.  5. The aortic valve is tricuspid. There is mild calcification of the  aortic valve. Aortic valve regurgitation is not visualized. Mild aortic  valve sclerosis is present, with no evidence of aortic valve stenosis.  6. The inferior vena cava is normal in size with greater than 50%  respiratory variability, suggesting right atrial pressure of 3 mmHg.  - Comparison(s): No significant change from prior study.    CT Coronary 03/11/20: IMPRESSION: 1. Coronary calcium score of 2566. This was 54 percentile for age and sex matched control. 2. Normal coronary origin with right dominance. 3. Severe coronary disease in LAD and RCA which appears flow limiting. Calcified plaque in LM and Circumflex. 4. Aortic atherosclerosis. 5. Recommend cardiac catheterization.)       Anesthesia Quick Evaluation

## 2020-10-17 NOTE — Progress Notes (Signed)
Anesthesia Chart Review: Deborah Wise   Case: [id number] Date/Time: 10/20/20 1421   Procedure: DENTAL RESTORATION/EXTRACTIONS (N/A )   Anesthesia type: General   Pre-op diagnosis: DENTAL CARIES   Location: [facility] OR ROOM 81 / [place] OR   Surgeons: Diona Browner, DMD      DISCUSSION: Patient is a 70 year old female scheduled for the above procedure.  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   Preoperative cardiology input by Kerin Ransom, PA-C 07/16/20, "Given past medical history and time since last visit, based on ACC/AHA guidelines, Deborah Wise would be at acceptable risk for the planned procedure without further cardiovascular testing.   Okay to hold aspirin 3 to 5 days preop.  The patient does not require SBE prophylaxis.  The patient was advised that if she develops new symptoms prior to surgery to contact our office to arrange for a follow-up visit, and she verbalized understanding."  10/17/20 preprocedure COVID-19 test is in process.  Anesthesia team to evaluate on the day of surgery.   VS: Ht 5' 4" (1.626 m)   Wt 78 kg   BMI 29.52 kg/m  BP Readings from Last 3 Encounters:  09/29/20 (!) 199/84  09/26/20 (!) 187/77  09/03/20 (!) 179/76    PROVIDERS: Isaac Bliss, Rayford Halsted, MD is PCP  Candee Furbish, MD is primary cardiologist Quay Burow, MD is [facility] cardiologist. Last visit 04/15/20. Brabham, V. Rock Nephew, MD is vascular surgeon. Last visit 07/16/20 with Dagoberto Ligas, PA-C. Six month follow-up planned for carotid artery stenosis. Antony Contras, MD is neurologist Sherlene Shams, MD is Leretha Dykes, NP is liver specialist provider ([place]). Last visit 07/18/20.    LABS: For day of  surgery as indicated. Comparison labs include: Lab Results  Component Value Date   WBC 6.6 08/15/2020   HGB 11.5 (L) 08/15/2020   HCT 35.6 (L) 08/15/2020   PLT 139.0 (L) 08/15/2020   GLUCOSE 108 (H) 08/15/2020   CHOL 147 08/15/2020   TRIG 108.0 08/15/2020   HDL 61.60 08/15/2020   LDLCALC 64 08/15/2020   ALT 28 08/15/2020   AST 37 08/15/2020   NA 138 08/15/2020   K 3.7 08/15/2020   CL 101 08/15/2020   CREATININE 0.72 08/15/2020   BUN 20 08/15/2020   CO2 25 08/15/2020   HGBA1C 6.3 08/15/2020     IMAGES: [place] Abd (limited) 08/05/20: IMPRESSION: No focal hepatic lesion.   EKG: 03/18/2020: Normal sinus rhythm, nonspecific ST-T wave changes   CV: Carotid US 07/09/20: Summary:  Right Carotid: Velocities in the right ICA are consistent with a 60-79% stenosis.  Left Carotid: Velocities in the left ICA are consistent with a 40-59% stenosis.  Vertebrals: Bilateral vertebral arteries demonstrate antegrade flow.  Subclavians: Normal flow hemodynamics were seen in bilateral subclavian arteries.    Cardiac cath 03/25/20:  Prox RCA lesion is 75% stenosed.  Dist RCA lesion is 50% stenosed.  Mid LAD lesion is 60% stenosed.  The left ventricular systolic function is normal.  LV end diastolic pressure is normal.  The left ventricular ejection fraction is 55-65% by visual estimate.  There is no aortic valve stenosis.  Some difficulty engaging the RCA from the right radial approach due to tortuosity. Consider groin approach if atherectomy needed.   Moderate diffuse calcific disease.  Proximal RCA is the most significant area.  D/w Dr. Marlou Porch.  Plan for medical therapy at this time.  If refractory sx on medical therapy, could consider atherectomy of RCA.    Echo 03/12/20: IMPRESSIONS  1. Left ventricular ejection fraction, by estimation, is 60 to 65%. The  left ventricle has normal function. The left ventricle has no regional  wall motion abnormalities. Left ventricular  diastolic parameters are  consistent with Grade I diastolic  dysfunction (impaired relaxation).  2. Right ventricular systolic function is normal. The right ventricular  size is normal. There is normal pulmonary artery systolic pressure. The  estimated right ventricular systolic pressure is 37.9 mmHg.  3. Left atrial size was mildly dilated.  4. The mitral valve is degenerative. No evidence of mitral valve  regurgitation. No evidence of mitral stenosis.  5. The aortic valve is tricuspid. There is mild calcification of the  aortic valve. Aortic valve regurgitation is not visualized. Mild aortic  valve sclerosis is present, with no evidence of aortic valve stenosis.  6. The inferior vena cava is normal in size with greater than 50%  respiratory variability, suggesting right atrial pressure of 3 mmHg.  - Comparison(s): No significant change from prior study.    CT Coronary 03/11/20: IMPRESSION: 1. Coronary calcium score of 2566. This was 79 percentile for age and sex matched control. 2. Normal coronary origin with right dominance. 3. Severe coronary disease in LAD and RCA which appears flow limiting. Calcified plaque in LM and Circumflex. 4. Aortic atherosclerosis. 5. Recommend cardiac catheterization.   Past Medical History:  Diagnosis Date  . Anemia   . Arthritis    denies  . Blood transfusion without reported diagnosis   . Carotid artery occlusion   . Cirrhosis ([place])   . Coronary artery disease   . Diabetes mellitus without complication ([place])   . Fibroid, uterine   . GERD (gastroesophageal reflux disease)   . Hepatitis    hep C- treated  . History of cerebral aneurysm   . Hypertension   . Peripheral vascular disease ([place])   . Stroke [facility])    no residual- Left Paramedian pontine infarct  . Thrombocytopenia ([place]) 12/25/2014  . Urinary incontinence    Patient noticed mild and is not currently a significant problem    Past Surgical History:  Procedure Laterality  Date  . ANKLE FRACTURE SURGERY Right 1999   x 2, rod  . CESAREAN SECTION     w/ tubal ligation  . COLONOSCOPY    . IR GENERIC HISTORICAL  06/30/2016   IR ANGIO VERTEBRAL SEL SUBCLAVIAN INNOMINATE UNI R MOD SED 06/30/2016 [name], MD [facility]  . IR GENERIC HISTORICAL  06/30/2016   IR ANGIO VERTEBRAL SEL VERTEBRAL UNI L MOD SED 06/30/2016 [name], MD [facility]  . IR GENERIC HISTORICAL  06/30/2016   IR ANGIO INTRA EXTRACRAN SEL COM CAROTID INNOMINATE BILAT MOD SED 06/30/2016 [name], MD [facility]  . LEFT HEART CATH AND CORONARY ANGIOGRAPHY N/A 03/25/2020   Procedure: LEFT HEART CATH AND CORONARY ANGIOGRAPHY;  Surgeon: Jettie Booze, MD;  Location: [place] CV LAB;  Service: Cardiovascular;  Laterality: N/A;  . MULTIPLE EXTRACTIONS WITH ALVEOLOPLASTY  10/25/2011   Procedure: MULTIPLE EXTRACION WITH ALVEOLOPLASTY;  Surgeon: Gae Bon, DDS;  Location: [place];  Service: Oral Surgery;  Laterality: Bilateral;  Extract teeth numbers one, two, six, seven, eight, nine, ten, eleven, twelve, fifteen, sixteen, eighteen, nineteen, twenty-three, twenty-four, twenty-five, twenty-seven, thirty-two and alveoplasty.  MEDICATIONS: . sodium chloride flush (NS) 0.9 % injection 3 mL   . amLODipine (NORVASC) 10 MG tablet  . ASPIRIN LOW DOSE 81 MG EC tablet  . carboxymethylcellulose (REFRESH PLUS) 0.5 % SOLN  . Cholecalciferol 25 MCG (1000 UT) capsule  . cyanocobalamin 1000 MCG tablet  . estradiol (ESTRACE) 0.1 MG/GM vaginal cream  . ferrous sulfate 325 (65 FE) MG EC tablet  . hydrochlorothiazide (HYDRODIURIL) 25 MG tablet  . lidocaine (XYLOCAINE) 2 % jelly  . meloxicam (MOBIC) 15 MG tablet  . metFORMIN (GLUCOPHAGE) 500 MG tablet  . REPATHA SURECLICK 175 MG/ML SOAJ  . tamsulosin (FLOMAX) 0.4 MG CAPS capsule  . triamcinolone cream (KENALOG) 0.1 %  . Trospium Chloride 60 MG CP24  . Accu-Chek FastClix Lancets MISC  . Accu-Chek Softclix Lancets lancets  .  Blood Glucose Monitoring Suppl (ACCU-CHEK GUIDE) w/Device KIT  . Catheters (SELF-CATH SYSTEM STRAIGHT TIP) KIT  . Continuous Blood Gluc Receiver (FREESTYLE LIBRE 2 READER) DEVI  . Continuous Blood Gluc Sensor (FREESTYLE LIBRE 2 SENSOR) MISC  . glucose blood (ACCU-CHEK GUIDE) test strip  . phenazopyridine (PYRIDIUM) 95 MG tablet  Lasts ASA 10/17/20.    Myra Gianotti, PA-C Surgical Short Stay/Anesthesiology [facility] Phone [phone] [facility] Phone [phone] 10/17/2020 2:09 PM

## 2020-10-18 LAB — SARS CORONAVIRUS 2 (TAT 6-24 HRS): SARS Coronavirus 2: NEGATIVE

## 2020-10-20 ENCOUNTER — Encounter (HOSPITAL_COMMUNITY)
Admission: RE | Disposition: A | Discharge status: Home / Self Care | Payer: Self-pay | Source: Home / Self Care | Attending: Oral Surgery

## 2020-10-20 ENCOUNTER — Other Ambulatory Visit: Payer: Self-pay

## 2020-10-20 ENCOUNTER — Ambulatory Visit (HOSPITAL_COMMUNITY)
Admission: RE | Admit: 2020-10-20 | Discharge: 2020-10-20 | Disposition: A | Discharge status: Home / Self Care | Payer: Medicare Other | Attending: Oral Surgery | Admitting: Oral Surgery

## 2020-10-20 ENCOUNTER — Ambulatory Visit (HOSPITAL_COMMUNITY): Payer: Medicare Other | Admitting: Vascular Surgery

## 2020-10-20 ENCOUNTER — Encounter (HOSPITAL_COMMUNITY): Payer: Self-pay | Admitting: Oral Surgery

## 2020-10-20 DIAGNOSIS — K011 Impacted teeth: Secondary | ICD-10-CM | POA: Diagnosis not present

## 2020-10-20 DIAGNOSIS — D649 Anemia, unspecified: Secondary | ICD-10-CM | POA: Insufficient documentation

## 2020-10-20 DIAGNOSIS — K7460 Unspecified cirrhosis of liver: Secondary | ICD-10-CM | POA: Diagnosis not present

## 2020-10-20 DIAGNOSIS — K056 Periodontal disease, unspecified: Secondary | ICD-10-CM | POA: Insufficient documentation

## 2020-10-20 DIAGNOSIS — K029 Dental caries, unspecified: Secondary | ICD-10-CM | POA: Diagnosis not present

## 2020-10-20 DIAGNOSIS — Z79899 Other long term (current) drug therapy: Secondary | ICD-10-CM | POA: Diagnosis not present

## 2020-10-20 DIAGNOSIS — E1151 Type 2 diabetes mellitus with diabetic peripheral angiopathy without gangrene: Secondary | ICD-10-CM | POA: Insufficient documentation

## 2020-10-20 DIAGNOSIS — I2510 Atherosclerotic heart disease of native coronary artery without angina pectoris: Secondary | ICD-10-CM | POA: Diagnosis not present

## 2020-10-20 DIAGNOSIS — Z7984 Long term (current) use of oral hypoglycemic drugs: Secondary | ICD-10-CM | POA: Insufficient documentation

## 2020-10-20 DIAGNOSIS — K219 Gastro-esophageal reflux disease without esophagitis: Secondary | ICD-10-CM | POA: Diagnosis not present

## 2020-10-20 DIAGNOSIS — Z7982 Long term (current) use of aspirin: Secondary | ICD-10-CM | POA: Diagnosis not present

## 2020-10-20 DIAGNOSIS — D696 Thrombocytopenia, unspecified: Secondary | ICD-10-CM | POA: Diagnosis not present

## 2020-10-20 DIAGNOSIS — Z8673 Personal history of transient ischemic attack (TIA), and cerebral infarction without residual deficits: Secondary | ICD-10-CM | POA: Insufficient documentation

## 2020-10-20 DIAGNOSIS — I10 Essential (primary) hypertension: Secondary | ICD-10-CM | POA: Insufficient documentation

## 2020-10-20 HISTORY — DX: Personal history of other diseases of the circulatory system: Z86.79

## 2020-10-20 HISTORY — PX: TOOTH EXTRACTION: SHX859

## 2020-10-20 HISTORY — DX: Peripheral vascular disease, unspecified: I73.9

## 2020-10-20 HISTORY — DX: Atherosclerotic heart disease of native coronary artery without angina pectoris: I25.10

## 2020-10-20 LAB — BASIC METABOLIC PANEL
Anion gap: 9 (ref 5–15)
BUN: 11 mg/dL (ref 8–23)
CO2: 24 mmol/L (ref 22–32)
Calcium: 9.7 mg/dL (ref 8.9–10.3)
Chloride: 106 mmol/L (ref 98–111)
Creatinine, Ser: 0.75 mg/dL (ref 0.44–1.00)
GFR, Estimated: >60 mL/min (ref >60)
Glucose, Bld: 120 mg/dL — ABNORMAL HIGH (ref 70–99)
Potassium: 3.7 mmol/L (ref 3.5–5.1)
Sodium: 139 mmol/L (ref 135–145)

## 2020-10-20 LAB — CBC WITH DIFFERENTIAL/PLATELET
Abs Immature Granulocytes: 0.03 10*3/uL (ref 0.00–0.07)
Basophils Absolute: 0 10*3/uL (ref 0.0–0.1)
Basophils Relative: 0 %
Eosinophils Absolute: 0 10*3/uL (ref 0.0–0.5)
Eosinophils Relative: 0 %
HCT: 37.9 % (ref 36.0–46.0)
Hemoglobin: 11.9 g/dL — ABNORMAL LOW (ref 12.0–15.0)
Immature Granulocytes: 0 %
Lymphocytes Relative: 35 %
Lymphs Abs: 2.5 10*3/uL (ref 0.7–4.0)
MCH: 24.1 pg — ABNORMAL LOW (ref 26.0–34.0)
MCHC: 31.4 g/dL (ref 30.0–36.0)
MCV: 76.9 fL — ABNORMAL LOW (ref 80.0–100.0)
Monocytes Absolute: 0.7 10*3/uL (ref 0.1–1.0)
Monocytes Relative: 9 %
Neutro Abs: 3.9 10*3/uL (ref 1.7–7.7)
Neutrophils Relative %: 56 %
Platelets: 129 10*3/uL — ABNORMAL LOW (ref 150–400)
RBC: 4.93 MIL/uL (ref 3.87–5.11)
RDW: 15.5 % (ref 11.5–15.5)
WBC: 7.2 10*3/uL (ref 4.0–10.5)
nRBC: 0.3 % — ABNORMAL HIGH (ref 0.0–0.2)

## 2020-10-20 LAB — GLUCOSE, CAPILLARY
Glucose-Capillary: 134 mg/dL — ABNORMAL HIGH (ref 70–99)
Glucose-Capillary: 168 mg/dL — ABNORMAL HIGH (ref 70–99)

## 2020-10-20 SURGERY — DENTAL RESTORATION/EXTRACTIONS
Anesthesia: General | Site: Mouth

## 2020-10-20 MED ORDER — OXYMETAZOLINE HCL 0.05 % NA SOLN
NASAL | Status: AC
  Filled 2020-10-20: qty 30

## 2020-10-20 MED ORDER — PROPOFOL 10 MG/ML IV BOLUS
INTRAVENOUS | Status: AC
  Filled 2020-10-20: qty 20

## 2020-10-20 MED ORDER — ONDANSETRON HCL 4 MG/2ML IJ SOLN
INTRAMUSCULAR | Status: AC
  Filled 2020-10-20: qty 2

## 2020-10-20 MED ORDER — SODIUM CHLORIDE 0.9 % IR SOLN
Status: DC | PRN
  Administered 2020-10-20: 1

## 2020-10-20 MED ORDER — MIDAZOLAM HCL 2 MG/2ML IJ SOLN
INTRAMUSCULAR | Status: DC | PRN
  Administered 2020-10-20: 2 mg via INTRAVENOUS

## 2020-10-20 MED ORDER — 0.9 % SODIUM CHLORIDE (POUR BTL) OPTIME
TOPICAL | Status: DC | PRN
  Administered 2020-10-20: 1000 mL

## 2020-10-20 MED ORDER — ORAL CARE MOUTH RINSE: 15.0000 mL | Freq: Once | OROMUCOSAL | Status: AC

## 2020-10-20 MED ORDER — LIDOCAINE-EPINEPHRINE 2 %-1:100000 IJ SOLN
INTRAMUSCULAR | Status: DC | PRN
  Administered 2020-10-20: 17 mL via INTRADERMAL

## 2020-10-20 MED ORDER — DEXAMETHASONE SODIUM PHOSPHATE 10 MG/ML IJ SOLN
INTRAMUSCULAR | Status: AC
  Filled 2020-10-20: qty 1

## 2020-10-20 MED ORDER — FENTANYL CITRATE (PF) 250 MCG/5ML IJ SOLN
INTRAMUSCULAR | Status: AC
  Filled 2020-10-20: qty 5

## 2020-10-20 MED ORDER — ONDANSETRON HCL 4 MG/2ML IJ SOLN
INTRAMUSCULAR | Status: DC | PRN
  Administered 2020-10-20: 4 mg via INTRAVENOUS

## 2020-10-20 MED ORDER — PHENYLEPHRINE 40 MCG/ML (10ML) SYRINGE FOR IV PUSH (FOR BLOOD PRESSURE SUPPORT)
PREFILLED_SYRINGE | INTRAVENOUS | Status: AC
  Filled 2020-10-20: qty 10

## 2020-10-20 MED ORDER — ROCURONIUM BROMIDE 10 MG/ML (PF) SYRINGE
PREFILLED_SYRINGE | INTRAVENOUS | Status: DC | PRN
  Administered 2020-10-20: 50 mg via INTRAVENOUS

## 2020-10-20 MED ORDER — LIDOCAINE-EPINEPHRINE 2 %-1:100000 IJ SOLN
INTRAMUSCULAR | Status: AC
  Filled 2020-10-20: qty 1

## 2020-10-20 MED ORDER — CEFAZOLIN SODIUM-DEXTROSE 2-4 GM/100ML-% IV SOLN
2.0000 g | INTRAVENOUS | Status: AC
  Administered 2020-10-20: 2 g via INTRAVENOUS

## 2020-10-20 MED ORDER — LACTATED RINGERS IV SOLN: INTRAVENOUS | Status: DC

## 2020-10-20 MED ORDER — FENTANYL CITRATE (PF) 250 MCG/5ML IJ SOLN
INTRAMUSCULAR | Status: DC | PRN
  Administered 2020-10-20: 50 ug via INTRAVENOUS
  Administered 2020-10-20: 100 ug via INTRAVENOUS

## 2020-10-20 MED ORDER — OXYMETAZOLINE HCL 0.05 % NA SOLN
NASAL | Status: DC | PRN
  Administered 2020-10-20: 1

## 2020-10-20 MED ORDER — SUGAMMADEX SODIUM 200 MG/2ML IV SOLN
INTRAVENOUS | Status: DC | PRN
  Administered 2020-10-20: 200 mg via INTRAVENOUS

## 2020-10-20 MED ORDER — AMOXICILLIN 500 MG PO CAPS: 500.0000 mg | ORAL_CAPSULE | Freq: Three times a day (TID) | ORAL | 0 refills | Status: DC

## 2020-10-20 MED ORDER — ROCURONIUM BROMIDE 10 MG/ML (PF) SYRINGE
PREFILLED_SYRINGE | INTRAVENOUS | Status: AC
  Filled 2020-10-20: qty 10

## 2020-10-20 MED ORDER — CHLORHEXIDINE GLUCONATE 0.12 % MT SOLN
OROMUCOSAL | Status: AC
  Administered 2020-10-20: 15 mL via OROMUCOSAL
  Filled 2020-10-20: qty 15

## 2020-10-20 MED ORDER — CEFAZOLIN SODIUM-DEXTROSE 2-4 GM/100ML-% IV SOLN
INTRAVENOUS | Status: AC
  Filled 2020-10-20: qty 100

## 2020-10-20 MED ORDER — LIDOCAINE 2% (20 MG/ML) 5 ML SYRINGE
INTRAMUSCULAR | Status: DC | PRN
  Administered 2020-10-20: 60 mg via INTRAVENOUS

## 2020-10-20 MED ORDER — SUGAMMADEX SODIUM 500 MG/5ML IV SOLN
INTRAVENOUS | Status: AC
  Filled 2020-10-20: qty 5

## 2020-10-20 MED ORDER — PHENYLEPHRINE HCL (PRESSORS) 10 MG/ML IV SOLN
INTRAVENOUS | Status: AC
  Filled 2020-10-20: qty 1

## 2020-10-20 MED ORDER — PROPOFOL 10 MG/ML IV BOLUS
INTRAVENOUS | Status: DC | PRN
  Administered 2020-10-20: 150 mg via INTRAVENOUS

## 2020-10-20 MED ORDER — MIDAZOLAM HCL 2 MG/2ML IJ SOLN
INTRAMUSCULAR | Status: AC
  Filled 2020-10-20: qty 2

## 2020-10-20 MED ORDER — DEXAMETHASONE SODIUM PHOSPHATE 10 MG/ML IJ SOLN
INTRAMUSCULAR | Status: DC | PRN
  Administered 2020-10-20: 5 mg via INTRAVENOUS

## 2020-10-20 MED ORDER — OXYCODONE HCL 5 MG PO TABS: 5.0000 mg | ORAL_TABLET | ORAL | 0 refills | Status: DC | PRN

## 2020-10-20 MED ORDER — CHLORHEXIDINE GLUCONATE 0.12 % MT SOLN: 15.0000 mL | Freq: Once | OROMUCOSAL | Status: AC

## 2020-10-20 MED ORDER — LIDOCAINE 2% (20 MG/ML) 5 ML SYRINGE
INTRAMUSCULAR | Status: AC
  Filled 2020-10-20: qty 5

## 2020-10-20 SURGICAL SUPPLY — 36 items
BLADE SURG 15 STRL LF DISP TIS (BLADE) ×1 IMPLANT
BLADE SURG 15 STRL SS (BLADE) ×2
BUR CROSS CUT FISSURE 1.6 (BURR) ×2 IMPLANT
BUR EGG ELITE 4.0 (BURR) ×2 IMPLANT
CANISTER SUCT 3000ML PPV (MISCELLANEOUS) ×2 IMPLANT
COVER SURGICAL LIGHT HANDLE (MISCELLANEOUS) ×2 IMPLANT
COVER WAND RF STERILE (DRAPES) IMPLANT
DECANTER SPIKE VIAL GLASS SM (MISCELLANEOUS) ×2 IMPLANT
DRAPE U-SHAPE 76X120 STRL (DRAPES) ×2 IMPLANT
GAUZE PACKING FOLDED 2  STR (GAUZE/BANDAGES/DRESSINGS) ×2
GAUZE PACKING FOLDED 2 STR (GAUZE/BANDAGES/DRESSINGS) ×1 IMPLANT
GLOVE BIO SURGEON STRL SZ 6.5 (GLOVE) ×1 IMPLANT
GLOVE BIO SURGEON STRL SZ7 (GLOVE) ×1 IMPLANT
GLOVE BIO SURGEON STRL SZ8 (GLOVE) ×2 IMPLANT
GLOVE SURG UNDER POLY LF SZ6.5 (GLOVE) IMPLANT
GLOVE SURG UNDER POLY LF SZ7 (GLOVE) IMPLANT
GOWN STRL REUS W/ TWL LRG LVL3 (GOWN DISPOSABLE) ×1 IMPLANT
GOWN STRL REUS W/ TWL XL LVL3 (GOWN DISPOSABLE) ×1 IMPLANT
GOWN STRL REUS W/TWL LRG LVL3 (GOWN DISPOSABLE) ×2
GOWN STRL REUS W/TWL XL LVL3 (GOWN DISPOSABLE) ×4
IV NS 1000ML (IV SOLUTION) ×2
IV NS 1000ML BAXH (IV SOLUTION) ×1 IMPLANT
KIT BASIN OR (CUSTOM PROCEDURE TRAY) ×2 IMPLANT
KIT TURNOVER KIT B (KITS) ×2 IMPLANT
NDL HYPO 25GX1X1/2 BEV (NEEDLE) ×2 IMPLANT
NEEDLE HYPO 25GX1X1/2 BEV (NEEDLE) ×4 IMPLANT
NS IRRIG 1000ML POUR BTL (IV SOLUTION) ×2 IMPLANT
PAD ARMBOARD 7.5X6 YLW CONV (MISCELLANEOUS) ×2 IMPLANT
SLEEVE IRRIGATION ELITE 7 (MISCELLANEOUS) ×2 IMPLANT
SPONGE SURGIFOAM ABS GEL 12-7 (HEMOSTASIS) IMPLANT
SUT CHROMIC 3 0 PS 2 (SUTURE) ×2 IMPLANT
SYR BULB IRRIG 60ML STRL (SYRINGE) ×2 IMPLANT
SYR CONTROL 10ML LL (SYRINGE) ×2 IMPLANT
TRAY ENT MC OR (CUSTOM PROCEDURE TRAY) ×2 IMPLANT
TUBING IRRIGATION (MISCELLANEOUS) ×2 IMPLANT
YANKAUER SUCT BULB TIP NO VENT (SUCTIONS) ×2 IMPLANT

## 2020-10-20 NOTE — H&P (Signed)
H&P documentation  -History and Physical Reviewed  -Patient has been re-examined  -No change in the plan of care  Deborah Wise  

## 2020-10-20 NOTE — Op Note (Signed)
10/20/2020  3:42 PM  PATIENT:  Deborah Wise  70 y.o. female  PRE-OPERATIVE DIAGNOSIS: IMPACTED TOOTH # 11, NON=RESTORABLE TEETH # 21, 22, 28, 30 SECONDARY TO DENTAL CARIES, PERIODONTAL DISEASE  POST-OPERATIVE DIAGNOSIS:  SAME  PROCEDURE:  Procedure(s): EXTRACTION TEETH # 11, 21, 22, 28, 30, AVELOPLASTY  SURGEON:  Surgeon(s): Diona Browner, DMD  ANESTHESIA:   local and general  EBL:  minimal  DRAINS: none   SPECIMEN:  No Specimen  COUNTS:  YES  PLAN OF CARE: Discharge to home after PACU  PATIENT DISPOSITION:  PACU - hemodynamically stable.   PROCEDURE DETAILS: Dictation # [id number]  Gae Bon, DMD 10/20/2020 3:42 PM

## 2020-10-20 NOTE — Transfer of Care (Addendum)
Immediate Anesthesia Transfer of Care Note  Patient: Deborah Wise  Procedure(s) Performed: MULTIPLE DENTAL RESTORATION/EXTRACTIONS, AVELOPLASTY (N/A Mouth)  Patient Location: PACU  Anesthesia Type:General  Level of Consciousness: awake and alert   Airway & Oxygen Therapy: Patient Spontanous Breathing  Post-op Assessment: Report given to RN and Post -op Vital signs reviewed and stable  Post vital signs: Reviewed and stable  Last Vitals:  Vitals Value Taken Time  BP 163/61 10/20/20 1550  Temp    Pulse 102 10/20/20 1551  Resp 18 10/20/20 1551  SpO2 99 % 10/20/20 1551  Vitals shown include unvalidated device data.  Last Pain:  Vitals:   10/20/20 1225  TempSrc:   PainSc: 0-No pain      Patients Stated Pain Goal: 3 (44/03/47 4259)  Complications: No complications documented.

## 2020-10-20 NOTE — Progress Notes (Signed)
Throatpack removed at [place]  placed again at 1530

## 2020-10-20 NOTE — Anesthesia Procedure Notes (Signed)
Procedure Name: Intubation Performed by: Eligha Bridegroom, CRNA Pre-anesthesia Checklist: Patient identified, Emergency Drugs available, Suction available and Patient being monitored Patient Re-evaluated:Patient Re-evaluated prior to induction Oxygen Delivery Method: Circle system utilized Preoxygenation: Pre-oxygenation with 100% oxygen Induction Type: IV induction Ventilation: Mask ventilation without difficulty Laryngoscope Size: Miller and 2 Grade View: Grade I Tube type: Oral Tube size: 7.0 mm Number of attempts: 1 Airway Equipment and Method: Stylet Placement Confirmation: ETT inserted through vocal cords under direct vision Secured at: 21 cm Tube secured with: Tape Dental Injury: Teeth and Oropharynx as per pre-operative assessment  Comments: Performed by Jacqualine Code

## 2020-10-21 ENCOUNTER — Encounter (HOSPITAL_COMMUNITY): Payer: Self-pay | Admitting: Oral Surgery

## 2020-10-21 NOTE — Anesthesia Postprocedure Evaluation (Signed)
Anesthesia Post Note  Patient: Deborah Wise  Procedure(s) Performed: MULTIPLE DENTAL RESTORATION/EXTRACTIONS, AVELOPLASTY (N/A Mouth)     Patient location during evaluation: PACU Anesthesia Type: General Level of consciousness: awake and alert Pain management: pain level controlled Vital Signs Assessment: post-procedure vital signs reviewed and stable Respiratory status: spontaneous breathing, nonlabored ventilation, respiratory function stable and patient connected to nasal cannula oxygen Cardiovascular status: blood pressure returned to baseline and stable Postop Assessment: no apparent nausea or vomiting Anesthetic complications: no   No complications documented.  Last Vitals:  Vitals:   10/20/20 1605 10/20/20 1622  BP: (!) 158/64 (!) 173/75  Pulse: 95 99  Resp: 16 (!) 21  Temp:  36.5 C  SpO2: 98% 96%    Last Pain:  Vitals:   10/20/20 1622  TempSrc:   PainSc: 0-No pain                 Belenda Cruise P An Schnabel

## 2020-10-21 NOTE — Op Note (Signed)
Deborah Wise, Deborah Wise MEDICAL RECORD NO: [id number] ACCOUNT NO: [id number] DATE OF BIRTH: 11/11/1950 FACILITY[facility] LOCATION: [facility] PHYSICIAN: Gae Bon, DDS  Operative Report   DATE OF PROCEDURE: 10/20/2020  PREOPERATIVE DIAGNOSES:  Impacted tooth #11, nonrestorable teeth numbers 21, 22, 28, 30 secondary to dental caries and periodontal disease.  POSTOPERATIVE DIAGNOSES:  Impacted tooth #11, nonrestorable teeth numbers 21, 22, 28, 30 secondary to dental caries and periodontal disease.  PROCEDURE PERFORMED:  Extraction of teeth #11, 21, 22, 28 and 30, alveoplasty left maxilla, right and left mandible.  SURGEON:  Diona Browner, DDS  ANESTHESIA:  General oral intubation.    DESCRIPTION OF PROCEDURE:  The patient was taken to the operating room and placed on the table in supine position.  General anesthesia was administered.  An oral endotracheal tube was placed and marked.  The patient was draped for surgery.  Eyes  protected. Timeout was performed.  The posterior pharynx was suctioned and a throat pack was placed.  2% lidocaine with 1:100,000 epinephrine was infiltrated in an inferior alveolar block on the right and left sides and a buccal and palatal infiltration  of the maxilla around tooth #11.  Additional anesthesia was administered in the anterior buccal vestibule of the mandible.  A bite block was placed on the right side of the mouth, a sweetheart retractor was used to retract the tongue.  A #15 blade was  used to make an incision from approximately tooth #13 area to tooth #9 area.  Periosteum was reflected.  Vertical incision was made at the posterior aspect of the incision to allow for access.  Tooth #11 was not visualized, but there was bone overlying  tooth #11 that was removed with a Stryker handpiece with fissure bur under irrigation and the crown was then identified.  The tooth was elevated.  The crown was sectioned and removed and then additional bone was removed, so  that the root could be removed  from #11.  Then, the socket was curetted.  Alveoplasty was required because the bone removal left sharp edges that needed to be smooth, so that the patient could continue to wear an upper denture.  Then, the area was irrigated and closed with 3-0  chromic.  The left mandible was operated next. A 15 blade was used to make an incision 1 cm proximal to tooth #21 on the alveolar crest, carried forward around the sulcus of teeth #21 and 22 and carried to the midline.  The periosteum was reflected.   Teeth were elevated and removed from the mouth with a dental forceps after elevating with a 301 elevator.  The sockets were curetted.  The bone was irregular in contour, so alveoplasty was required.  The periosteum was reflected to expose the alveolar  crest and then egg-shaped bur followed by the bone file was used to perform alveoplasty and then the area was irrigated and closed with 3-0 chromic.  Then, throat pack was removed.  The endotracheal tube was repositioned to exit the left side of the  mouth and the throat pack was repositioned.  Then, a 15 blade was used to make an incision around teeth #28 and 30 with proximal and distal extensions of 1 cm on the alveolar crest.  The teeth were elevated and removed with dental forceps.  Tissue was  trimmed and the periosteum was reflected to expose the alveolar crest, which required alveoplasty because of irregular borders and sharp edges.  Alveoplasty was performed using an egg bur followed  by the bone file and then the area was irrigated and  closed with 3-0 chromic.  Then, the oral cavity was irrigated and suctioned.  Additional local anesthesia was administered.  The patient was left under care after suctioning and removal of throat pack, in care of the anesthesia personnel for extubation  and transport to recovery room. Plans for discharge home through day surgery.  ESTIMATED BLOOD LOSS:  Minimum.  COMPLICATIONS:   None.  SPECIMENS:  None.   SHW D: 10/20/2020 3:47:32 pm T: 10/21/2020 4:50:00 am  JOB: [id number]/ [id number]

## 2020-10-28 ENCOUNTER — Telehealth: Payer: Self-pay | Admitting: Pharmacist

## 2020-10-28 NOTE — Chronic Care Management (AMB) (Addendum)
Chronic Care Management Pharmacy Assistant   Name: Deborah Wise  MRN: [id number] DOB: 30-Oct-1950   Reason for Encounter: Medication Review- Medication Question from Pharmacy    Recent office visits:  None  Recent consult visits:  None  Hospital visits:  Medication Reconciliation was completed by comparing discharge summary, patient's EMR and Pharmacy list, and upon discussion with patient.  Admitted to the hospital on 10/20/2020 due to Multiple Dental extractions . Discharge date was 05.16.2022. Discharged from [place]?Medications Started at [facility] Discharge:?? -started the following medication: Amoxicillin 500 mg oral 3 times a day Oxycodone 5 mg ( medication held until 05.25.2022)  Medication Changes at Hospital Discharge: -Changed None  Medications Discontinued at Hospital Discharge: -Stopped  Meloxicam 15 mg Phenazopyridine 95 mg    Medications that remain the same after Hospital Discharge:??  -All other medications will remain the same.    Medications: Outpatient Encounter Medications as of 10/28/2020  Medication Sig   Accu-Chek FastClix Lancets MISC 1 each by Does not apply route daily.   Accu-Chek Softclix Lancets lancets USE AS DIRECTED TO CHECK BLOOD SUGAR TWICE A DAY   amLODipine (NORVASC) 10 MG tablet TAKE ONE TABLET BY MOUTH EVERY MORNING FOR BLOOD PRESSURE (Patient taking differently: Take 10 mg by mouth daily.)   amoxicillin (AMOXIL) 500 MG capsule Take 1 capsule (500 mg total) by mouth 3 (three) times daily.   ASPIRIN LOW DOSE 81 MG EC tablet TAKE 1 TABLET BY MOUTH EVERY DAY (Patient taking differently: Take 81 mg by mouth daily.)   Blood Glucose Monitoring Suppl (ACCU-CHEK GUIDE) w/Device KIT Please specify directions, refills and quantity   carboxymethylcellulose (REFRESH PLUS) 0.5 % SOLN Place 1 drop into both eyes daily.   Catheters (SELF-CATH SYSTEM STRAIGHT TIP) KIT 1 each by Does not apply route as needed.    Cholecalciferol 25 MCG (1000 UT) capsule Take 1,000 Units by mouth daily.    Continuous Blood Gluc Receiver (FREESTYLE LIBRE 2 READER) DEVI 1 each by Does not apply route daily.   Continuous Blood Gluc Sensor (FREESTYLE LIBRE 2 SENSOR) MISC 1 each by Does not apply route daily.   cyanocobalamin 1000 MCG tablet Take 1,000 mcg by mouth daily.    estradiol (ESTRACE) 0.1 MG/GM vaginal cream Place 0.5 g vaginally 2 (two) times a week. Place 0.5g nightly for two weeks then twice a week after (Patient taking differently: Place 0.5 g vaginally 2 (two) times a week.)   ferrous sulfate 325 (65 FE) MG EC tablet Take 1 tablet (325 mg total) by mouth in the morning and at bedtime.   glucose blood (ACCU-CHEK GUIDE) test strip 1 each by Other route in the morning and at bedtime. Dx E11.9   hydrochlorothiazide (HYDRODIURIL) 25 MG tablet TAKE ONE TABLET BY MOUTH EVERY MORNING FOR fluid AND FOR BLOOD PRESSURE (Patient taking differently: Take 25 mg by mouth daily.)   lidocaine (XYLOCAINE) 2 % jelly Apply 1 application topically as needed. (Patient taking differently: Apply 1 application topically daily as needed (pain).)   metFORMIN (GLUCOPHAGE) 500 MG tablet TAKE ONE TABLET BY MOUTH EVERY MORNING FOR diabetes (Patient taking differently: Take 500 mg by mouth daily with breakfast.)   oxyCODONE (OXY IR/ROXICODONE) 5 MG immediate release tablet Take 1 tablet (5 mg total) by mouth every 4 (four) hours as needed.   REPATHA SURECLICK 759 MG/ML SOAJ INJECT 135m into THE SKIN every 14 DAYS FOR cholesterol (Patient taking differently: Inject 140 mg into the skin  every 14 (fourteen) days.)   tamsulosin (FLOMAX) 0.4 MG CAPS capsule Take 1 capsule (0.4 mg total) by mouth daily.   triamcinolone cream (KENALOG) 0.1 % APPLY TO THE AFFECTED AREA(S) TWICE DAILY FOR SKIN condition (Patient taking differently: Apply 1 application topically daily as needed (Dry skin).)   Trospium Chloride 60 MG CP24 Take 1 capsule (60 mg total) by mouth  daily. (Patient taking differently: Take 60 mg by mouth daily.)   Facility-Administered Encounter Medications as of 10/28/2020  Medication   sodium chloride flush (NS) 0.9 % injection 3 mL    I spoke with the patient about her medication. She must follow up with an oral surgeon tomorrow May 25 th. She's been on an antibiotic for the past seven days. Her oral surgeon originally sent pain medication, but he held it until her next appointment. She states that she will call back if she's having any issues or problems. The patient did not have any other concerns or questions currently.  Star Rating Drugs: None  Amilia Revonda Standard, [place] Pharmacist Assistant [phone]

## 2020-11-06 ENCOUNTER — Telehealth: Payer: Self-pay | Admitting: Pharmacist

## 2020-11-06 NOTE — Chronic Care Management (AMB) (Signed)
Chronic Care Management Pharmacy Assistant   Name: Deborah Wise  MRN: [id number] DOB: 06/21/1950  Reason for Encounter: Disease State and Medication Review   Conditions to be addressed/monitored: HTN  Recent office visits:  None  Recent consult visits:  None  Hospital visits:  None in previous 6 months  Medications: Outpatient Encounter Medications as of 11/06/2020  Medication Sig  . Accu-Chek FastClix Lancets MISC 1 each by Does not apply route daily.  . Accu-Chek Softclix Lancets lancets USE AS DIRECTED TO CHECK BLOOD SUGAR TWICE A DAY  . amLODipine (NORVASC) 10 MG tablet TAKE ONE TABLET BY MOUTH EVERY MORNING FOR BLOOD PRESSURE (Patient taking differently: Take 10 mg by mouth daily.)  . amoxicillin (AMOXIL) 500 MG capsule Take 1 capsule (500 mg total) by mouth 3 (three) times daily.  . ASPIRIN LOW DOSE 81 MG EC tablet TAKE 1 TABLET BY MOUTH EVERY DAY (Patient taking differently: Take 81 mg by mouth daily.)  . Blood Glucose Monitoring Suppl (ACCU-CHEK GUIDE) w/Device KIT Please specify directions, refills and quantity  . carboxymethylcellulose (REFRESH PLUS) 0.5 % SOLN Place 1 drop into both eyes daily.  . Catheters (SELF-CATH SYSTEM STRAIGHT TIP) KIT 1 each by Does not apply route as needed.  . Cholecalciferol 25 MCG (1000 UT) capsule Take 1,000 Units by mouth daily.   . Continuous Blood Gluc Receiver (FREESTYLE LIBRE 2 READER) DEVI 1 each by Does not apply route daily.  . Continuous Blood Gluc Sensor (FREESTYLE LIBRE 2 SENSOR) MISC 1 each by Does not apply route daily.  . cyanocobalamin 1000 MCG tablet Take 1,000 mcg by mouth daily.   [name] Kitchen estradiol (ESTRACE) 0.1 MG/GM vaginal cream Place 0.5 g vaginally 2 (two) times a week. Place 0.5g nightly for two weeks then twice a week after (Patient taking differently: Place 0.5 g vaginally 2 (two) times a week.)  . ferrous sulfate 325 (65 FE) MG EC tablet Take 1 tablet (325 mg total) by mouth in the morning and at bedtime.  [name] Kitchen  glucose blood (ACCU-CHEK GUIDE) test strip 1 each by Other route in the morning and at bedtime. Dx E11.9  . hydrochlorothiazide (HYDRODIURIL) 25 MG tablet TAKE ONE TABLET BY MOUTH EVERY MORNING FOR fluid AND FOR BLOOD PRESSURE (Patient taking differently: Take 25 mg by mouth daily.)  . lidocaine (XYLOCAINE) 2 % jelly Apply 1 application topically as needed. (Patient taking differently: Apply 1 application topically daily as needed (pain).)  . metFORMIN (GLUCOPHAGE) 500 MG tablet TAKE ONE TABLET BY MOUTH EVERY MORNING FOR diabetes (Patient taking differently: Take 500 mg by mouth daily with breakfast.)  . oxyCODONE (OXY IR/ROXICODONE) 5 MG immediate release tablet Take 1 tablet (5 mg total) by mouth every 4 (four) hours as needed.  [name] Kitchen REPATHA SURECLICK 175 MG/ML SOAJ INJECT 111m into THE SKIN every 14 DAYS FOR cholesterol (Patient taking differently: Inject 140 mg into the skin every 14 (fourteen) days.)  . tamsulosin (FLOMAX) 0.4 MG CAPS capsule Take 1 capsule (0.4 mg total) by mouth daily.  .[name] Kitchentriamcinolone cream (KENALOG) 0.1 % APPLY TO THE AFFECTED AREA(S) TWICE DAILY FOR SKIN condition (Patient taking differently: Apply 1 application topically daily as needed (Dry skin).)  . Trospium Chloride 60 MG CP24 Take 1 capsule (60 mg total) by mouth daily. (Patient taking differently: Take 60 mg by mouth daily.)   Facility-Administered Encounter Medications as of 11/06/2020  Medication  . sodium chloride flush (NS) 0.9 % injection 3 mL   Reviewed chart for medication changes ahead  of medication coordination call.   BP Readings from Last 3 Encounters:  10/20/20 (!) 173/75  09/29/20 (!) 199/84  09/26/20 (!) 187/77    Lab Results  Component Value Date   HGBA1C 6.3 08/15/2020     Patient obtains medications through Adherence Packaging  30 Days  Last adherence delivery included: [name] Kitchen Amlodipine 10 mg: one tablet at breakfast . Aspirin 81 mg: one tablet at breakfast . Vitamin D3 MCG 1000-unit: one  capsule at breakfast . Vitamin B-12 Daily 1000 MCG: one tablet at breakfast . Repatha Sureclick 093 MG/ML: inject every 14 days . Hydrochlorothiazide 25 mg: one tablet at breakfast . Metformin 500 mg: one tablet at breakfast . Triamcinolone cream (KENALOG) 0.1 %: apply to affected areas twice daily . Trospium Chloride 60 mg Oral Daily  . Tamsulosin HCl 0.4 mg: one capsule at breakfast  . Ferrous sulfate 325 (65 FE) MG EC tablet: one tablet at breakfast and one tablet at bedtime . Estradiol 0.5 g Vaginal 2 times weekly, Place 0.5 g nightly for two weeks then twice a week after  Patient is due for next adherence delivery on: 11/15/2019. Called patient and reviewed medications and coordinated delivery. This delivery to include: . Amlodipine 10 mg: one tablet at breakfast . Aspirin 81 mg: one tablet at breakfast . Vitamin D3 MCG 1000-unit: one capsule at breakfast . Vitamin B-12 Daily 1000 MCG: one tablet at breakfast . Repatha Sureclick 112 MG/ML: inject every 14 days . Hydrochlorothiazide 25 mg: one tablet at breakfast . Metformin 500 mg: one tablet at breakfast . Triamcinolone cream (KENALOG) 0.1 %: apply to affected areas twice daily . Trospium Chloride 60 mg Oral Daily  . Tamsulosin HCl 0.4 mg: one capsule at breakfast  . Ferrous sulfate 325 (65 FE) MG EC tablet: one tablet at breakfast and one tablet at bedtime . Estradiol 0.5 g Vaginal 2 times weekly, Place 0.5 g nightly for two weeks then twice a week after  She currently doe snot need refills Confirmed delivery date of 11/14/2020, advised patient that pharmacy will contact them the morning of delivery.  Reviewed chart prior to disease state call. Spoke with patient regarding BP  Recent Office Vitals: BP Readings from Last 3 Encounters:  10/20/20 (!) 173/75  09/29/20 (!) 199/84  09/26/20 (!) 187/77   Pulse Readings from Last 3 Encounters:  10/20/20 99  09/29/20 93  09/26/20 79    Wt Readings from Last 3 Encounters:   10/20/20 172 lb (78 kg)  09/29/20 174 lb (78.9 kg)  09/26/20 174 lb (78.9 kg)     Kidney Function Lab Results  Component Value Date/Time   CREATININE 0.75 10/20/2020 11:54 AM   CREATININE 0.72 08/15/2020 08:12 AM   CREATININE 0.8 12/31/2014 02:29 PM   GFR 85.84 08/15/2020 08:12 AM   GFRNONAA >60 10/20/2020 11:54 AM   GFRAA 84 03/18/2020 03:56 PM    BMP Latest Ref Rng & Units 10/20/2020 08/15/2020 03/18/2020  Glucose 70 - 99 mg/dL 120(H) 108(H) 87  BUN 8 - 23 mg/dL _0 Creatinine 0.44 - 1.00 mg/dL 0.75 0.72 0.83  BUN/Creat Ratio 12 - 28 - - 16  Sodium 135 - 145 mmol/L 139 138 143  Potassium 3.5 - 5.1 mmol/L 3.7 3.7 3.7  Chloride 98 - 111 mmol/L 106 101 103  CO2 22 - 32 mmol/L _1 Calcium 8.9 - 10.3 mg/dL 9.7 9.9 10.1   . Current antihypertensive regimen:  o Amlodipine 10 mg daily  o HCTZ  25 mg daily  o Hydralazine 25 mg daily . How often are you checking your Blood Pressure? Not checking . Current home BP readings: None . What recent interventions/DTPs have been made by any provider to improve Blood Pressure control since last CPP Visit: None . Any recent hospitalizations or ED visits since last visit with CPP? Yes . What diet changes have been made to improve Blood Pressure Control?  o She is eating soft food due to recent dental surgery . What exercise is being done to improve your Blood Pressure Control?  o None  Adherence Review: Is the patient currently on ACE/ARB medication? Yes Does the patient have >5 day gap between last estimated fill dates? No  I spoke with the patient about medication adherence. She stated that she has been doing okay. She recently had dental surgery. She has been prescribed Amoxicillin 500 mg for 7 days and Oxycodone 5 mg quantity 30 tablets. She has been on a soft diet since her surgery as well. There have been no other medication changes. The patient states that she has not been taking her blood pressure. It was suggested that she  try to take a blood pressure once or twice a week and get a notebook to record them. She understood. She states she is taking her medication as prescribed. There have been no other hospital visits or urgent care visits since her last PCP or CPP visit. She has scheduled her next CCM appointment for the September period this patient is part of [facility].  Star Rating Drugs: Medication Dispensed  Quantity Pharmacy  Metformin 500 mg  05.06.2022 30 [facility]     Lylia Karn Revonda Standard, [facility] Pharmacist Assistant [phone]

## 2020-11-11 DIAGNOSIS — R339 Retention of urine, unspecified: Secondary | ICD-10-CM | POA: Diagnosis not present

## 2020-12-03 ENCOUNTER — Encounter: Payer: Self-pay | Admitting: Internal Medicine

## 2020-12-05 ENCOUNTER — Telehealth: Payer: Self-pay | Admitting: Pharmacist

## 2020-12-05 NOTE — Chronic Care Management (AMB) (Signed)
Chronic Care Management Pharmacy Assistant   Name: Deborah Wise  MRN: [id number] DOB: 1950-09-11  Reason for Encounter: Medication Review/ Medication Coordination Call.    Recent office visits:  None.   Recent consult visits:  None.   Hospital visits:  None in previous 6 months  Medications: Outpatient Encounter Medications as of 12/05/2020  Medication Sig   Accu-Chek FastClix Lancets MISC 1 each by Does not apply route daily.   Accu-Chek Softclix Lancets lancets USE AS DIRECTED TO CHECK BLOOD SUGAR TWICE A DAY   amLODipine (NORVASC) 10 MG tablet TAKE ONE TABLET BY MOUTH EVERY MORNING FOR BLOOD PRESSURE (Patient taking differently: Take 10 mg by mouth daily.)   amoxicillin (AMOXIL) 500 MG capsule Take 1 capsule (500 mg total) by mouth 3 (three) times daily.   ASPIRIN LOW DOSE 81 MG EC tablet TAKE 1 TABLET BY MOUTH EVERY DAY (Patient taking differently: Take 81 mg by mouth daily.)   Blood Glucose Monitoring Suppl (ACCU-CHEK GUIDE) w/Device KIT Please specify directions, refills and quantity   carboxymethylcellulose (REFRESH PLUS) 0.5 % SOLN Place 1 drop into both eyes daily.   Catheters (SELF-CATH SYSTEM STRAIGHT TIP) KIT 1 each by Does not apply route as needed.   Cholecalciferol 25 MCG (1000 UT) capsule Take 1,000 Units by mouth daily.    Continuous Blood Gluc Receiver (FREESTYLE LIBRE 2 READER) DEVI 1 each by Does not apply route daily.   Continuous Blood Gluc Sensor (FREESTYLE LIBRE 2 SENSOR) MISC 1 each by Does not apply route daily.   cyanocobalamin 1000 MCG tablet Take 1,000 mcg by mouth daily.    estradiol (ESTRACE) 0.1 MG/GM vaginal cream Place 0.5 g vaginally 2 (two) times a week. Place 0.5g nightly for two weeks then twice a week after (Patient taking differently: Place 0.5 g vaginally 2 (two) times a week.)   ferrous sulfate 325 (65 FE) MG EC tablet Take 1 tablet (325 mg total) by mouth in the morning and at bedtime.   glucose blood (ACCU-CHEK GUIDE) test strip 1  each by Other route in the morning and at bedtime. Dx E11.9   hydrochlorothiazide (HYDRODIURIL) 25 MG tablet TAKE ONE TABLET BY MOUTH EVERY MORNING FOR fluid AND FOR BLOOD PRESSURE (Patient taking differently: Take 25 mg by mouth daily.)   lidocaine (XYLOCAINE) 2 % jelly Apply 1 application topically as needed. (Patient taking differently: Apply 1 application topically daily as needed (pain).)   metFORMIN (GLUCOPHAGE) 500 MG tablet TAKE ONE TABLET BY MOUTH EVERY MORNING FOR diabetes (Patient taking differently: Take 500 mg by mouth daily with breakfast.)   oxyCODONE (OXY IR/ROXICODONE) 5 MG immediate release tablet Take 1 tablet (5 mg total) by mouth every 4 (four) hours as needed.   REPATHA SURECLICK 449 MG/ML SOAJ INJECT 128m into THE SKIN every 14 DAYS FOR cholesterol (Patient taking differently: Inject 140 mg into the skin every 14 (fourteen) days.)   tamsulosin (FLOMAX) 0.4 MG CAPS capsule Take 1 capsule (0.4 mg total) by mouth daily.   triamcinolone cream (KENALOG) 0.1 % APPLY TO THE AFFECTED AREA(S) TWICE DAILY FOR SKIN condition (Patient taking differently: Apply 1 application topically daily as needed (Dry skin).)   Trospium Chloride 60 MG CP24 Take 1 capsule (60 mg total) by mouth daily. (Patient taking differently: Take 60 mg by mouth daily.)   Facility-Administered Encounter Medications as of 12/05/2020  Medication   sodium chloride flush (NS) 0.9 % injection 3 mL   Reviewed chart for medication changes ahead of medication coordination  call.  No OVs, Consults, or hospital visits since last care coordination call/Pharmacist visit. (If appropriate, list visit date, provider name)  No medication changes indicated OR if recent visit, treatment plan here.  BP Readings from Last 3 Encounters:  10/20/20 (!) 173/75  09/29/20 (!) 199/84  09/26/20 (!) 187/77    Lab Results  Component Value Date   HGBA1C 6.3 08/15/2020     Patient obtains medications through Adherence Packaging  30 Days    Last adherence delivery included: (medication name and frequency) Amlodipine 10 mg: one tablet at breakfast Aspirin 81 mg: one tablet at breakfast Vitamin D3 MCG 1000-unit: one capsule at breakfast Vitamin B-12 Daily 1000 MCG: one tablet at breakfast Repatha Sureclick 175 MG/ML: inject every 14 days Hydrochlorothiazide 25 mg: one tablet at breakfast Metformin 500 mg: one tablet at breakfast Triamcinolone cream (KENALOG) 0.1 %: apply to affected areas twice daily Trospium Chloride 60 mg Oral Daily Tamsulosin HCl 0.4 mg: one capsule at breakfast Ferrous sulfate 325 (65 FE) MG EC tablet: one tablet at breakfast and one tablet at bedtime Estradiol 0.5 g Vaginal 2 times weekly, Place 0.5 g nightly for two weeks then twice a week after  Patient declined (meds) last month due to PRN use/additional supply on hand. None.   Patient is due for next adherence delivery on: 12/16/20. Called patient and reviewed medications and coordinated delivery.  This delivery to include: Repatha Sureclick 301 MG/ML: inject every 14 days Triamcinolone cream (KENALOG) 0.1 %: apply to affected areas twice daily  Patient declined medications below due to having an extra box and had been skipping some. Patient stated she will not need medications below until next months delivery. Amlodipine 10 mg: one tablet at breakfast Aspirin 81 mg: one tablet at breakfast Vitamin D3 MCG 1000-unit: one capsule at breakfast Vitamin B-12 Daily 1000 MCG: one tablet at breakfast Hydrochlorothiazide 25 mg: one tablet at breakfast Metformin 500 mg: one tablet at breakfast Trospium Chloride 60 mg Oral Daily Tamsulosin HCl 0.4 mg: one capsule at breakfast Ferrous sulfate 325 (65 FE) MG EC tablet: one tablet at breakfast and one tablet at bedtime Estradiol 0.5 g Vaginal 2 times weekly, Place 0.5 g nightly for two weeks then twice a week after  Confirmed delivery date of 12/16/20, advised patient that pharmacy will contact them the  morning of delivery.   Star Rating Drugs:  Metformin 539m - [facility] ([phone]

## 2020-12-10 ENCOUNTER — Encounter: Payer: Self-pay | Admitting: Internal Medicine

## 2020-12-18 ENCOUNTER — Telehealth: Payer: Self-pay | Admitting: Internal Medicine

## 2020-12-18 NOTE — Telephone Encounter (Signed)
Pt is calling in to see if we have received a form for health care supplies.  Pt would like to have a call back.

## 2020-12-18 NOTE — Telephone Encounter (Signed)
Someone from [facility] is calling to see if we have received a prescription for the pt.  They would like to have a call back.

## 2020-12-18 NOTE — Telephone Encounter (Signed)
[facility] calling back to see if Dr. Jerilee Hoh receive fax for ncontinence supplies: pull-up, gloves, and pads for bed that was sent on 7/7. The call back number is [phone]. Also I advised the caller to refax the form for supplies and fax number was given.

## 2020-12-23 NOTE — Telephone Encounter (Signed)
Pt is calling in to check the status of the below msg

## 2020-12-23 NOTE — Telephone Encounter (Signed)
The patient is calling back to see if the order has been sent back for her incontinence supplies.  Please advise

## 2020-12-23 NOTE — Telephone Encounter (Signed)
In your folder to be signed.

## 2020-12-24 NOTE — Telephone Encounter (Signed)
Pt is calling to check the status of the below msg and she is aware that it is on the providers desktop to be addressed.  Pt would like to have a call back when it is completed so that she does not have to call the office everyday to check on it.

## 2020-12-25 ENCOUNTER — Telehealth: Payer: Self-pay | Admitting: Internal Medicine

## 2020-12-25 NOTE — Telephone Encounter (Signed)
Noted  

## 2020-12-25 NOTE — Telephone Encounter (Signed)
See phone note

## 2020-12-25 NOTE — Telephone Encounter (Signed)
Pt call and stated she don't want the incontinece  supplies so she stated that she want you to cancel that because she don't want it.

## 2020-12-29 ENCOUNTER — Ambulatory Visit (INDEPENDENT_AMBULATORY_CARE_PROVIDER_SITE_OTHER): Payer: Medicare Other | Admitting: Obstetrics and Gynecology

## 2020-12-29 ENCOUNTER — Encounter: Payer: Self-pay | Admitting: Obstetrics and Gynecology

## 2020-12-29 ENCOUNTER — Other Ambulatory Visit: Payer: Self-pay

## 2020-12-29 VITALS — BP 180/72 | HR 79

## 2020-12-29 DIAGNOSIS — N3289 Other specified disorders of bladder: Secondary | ICD-10-CM | POA: Diagnosis not present

## 2020-12-29 DIAGNOSIS — N320 Bladder-neck obstruction: Secondary | ICD-10-CM

## 2020-12-29 DIAGNOSIS — N3281 Overactive bladder: Secondary | ICD-10-CM

## 2020-12-29 NOTE — Progress Notes (Signed)
[facility] Return Visit  SUBJECTIVE  History of Present Illness: Deborah Wise is a 70 y.o. female seen in follow-up for incomplete bladder emptying and vaginal atrophy.  Last visit, cystoscopy in office was attempted due to bladder wall thickening but could not bypass the urethra.   Has been catheterizing at least 4 times a day, sometimes more. Sometimes has difficulty with catheter insertion. Has not been using lidocaine jelly- never picked it up. Taking Tropsium '60mg'$  daily and Flomax 0.'4mg'$ . Urgency and leakage has improved. Does not feel the flomax is helping with emptying.  Has not been using the estrogen cream- used it once but filled up the whole syringe and it was too much cream.   Past Medical History: Patient  has a past medical history of Anemia, Arthritis, Blood transfusion without reported diagnosis, Carotid artery occlusion, Cirrhosis ([place]), Coronary artery disease, Diabetes mellitus without complication ([place]), Fibroid, uterine, GERD (gastroesophageal reflux disease), Hepatitis, History of cerebral aneurysm, Hypertension, Peripheral vascular disease ([place]), Stroke ([place]), Thrombocytopenia ([place]) (12/25/2014), and Urinary incontinence.   Past Surgical History: She  has a past surgical history that includes Multiple extractions with alveoloplasty (10/25/2011); ir generic historical (06/30/2016); ir generic historical (06/30/2016); ir generic historical (06/30/2016); Ankle fracture surgery (Right, 1999); Colonoscopy; LEFT HEART CATH AND CORONARY ANGIOGRAPHY (N/A, 03/25/2020); Cesarean section; and Tooth Extraction (N/A, 10/20/2020).   Medications: She has a current medication list which includes the following prescription(s): accu-chek fastclix lancets, accu-chek softclix lancets, amlodipine, amoxicillin, aspirin low dose, accu-chek guide, carboxymethylcellulose, self-cath system straight tip, chlorhexidine, cholecalciferol, freestyle libre 2 reader, freestyle libre 2 sensor,  cyanocobalamin, estradiol, ferrous sulfate, accu-chek guide, hydrochlorothiazide, lidocaine, metformin, oxycodone, repatha sureclick, tamsulosin, triamcinolone cream, and trospium chloride, and the following Facility-Administered Medications: sodium chloride flush.   Allergies: Patient has No Known Allergies.   Social History: Patient  reports that she has never smoked. She has never used smokeless tobacco. She reports previous alcohol use. She reports previous drug use. Frequency: 1.00 time per week.      OBJECTIVE     Physical Exam: Vitals:   12/29/20 0811  BP: (!) 204/68  Pulse: 79   Gen: No apparent distress, A&O x 3.  Detailed Urogynecologic Evaluation:  Deferred.    ASSESSMENT AND PLAN    [name] is a 70 y.o. with:  1. Bladder outlet obstruction   2. Detrusor overactivity   3. Bladder wall thickening      1. BOO/ Detrusor overactivity/ elevated detrusor pressure - Continue to self catheterize at least 4 times daily. Has been getting supplies from [facility].  - Stop flomax as this has not improved her ability to void.  - Continue Trospium '60mg'$  daily to reduce urgency and detrusor pressures.  - Renal US showed bladder wall thickening and cystoscopy could not be done in the office. Will plan for cystoscopy in the OR with possible biopsy.  We discussed risks of bleeding, infection, damage to surrounding organs, and the rarer risks of blood clot, heart attack, pneumonia, death with surgery. All her questions were answered and she verbalized understanding.  We will call her to schedule the procedure.    2. Vaginal atrophy/ irritation - Use estrace cream 0.5g twice a week - Can use coconut oil in between for irritation.   Return 6 months or sooner if needed. Plan for cystoscopy in OR next month.   [name], MD   Time spent: I spent 20 minutes dedicated to the care of this patient on the date of  this encounter to include pre-visit review of records,  face-to-face time with the patient and post visit documentation and ordering medication/ testing.

## 2020-12-29 NOTE — Patient Instructions (Addendum)
Stop Flomax (orange and brown capsule).   For vaginal irritation, use a pea sized amount of vaginal estrogen cream twice a week. On other days, you can use coconut oil.   You can use lidocaine jelly around the urethra to help with pain- this was sent to your pharmacy.

## 2020-12-30 ENCOUNTER — Other Ambulatory Visit: Payer: Self-pay | Admitting: Obstetrics and Gynecology

## 2020-12-30 DIAGNOSIS — N320 Bladder-neck obstruction: Secondary | ICD-10-CM

## 2021-01-06 ENCOUNTER — Ambulatory Visit: Payer: Medicare Other | Admitting: Obstetrics and Gynecology

## 2021-01-06 ENCOUNTER — Telehealth: Payer: Self-pay | Admitting: Pharmacist

## 2021-01-06 NOTE — Chronic Care Management (AMB) (Signed)
Chronic Care Management Pharmacy Assistant   Name: Deborah Wise  MRN: [id number] DOB: [birth year]  Reason for Encounter: Medication Review/ Medication Coordination Call.    Recent office visits:  None.   Recent consult visits:  12/29/20 Sherlene Shams MD (Obstetrics and Gynecology) - presented to clinic for follow up on bladder outlet obstruction and other issues. No medication changes and follow up in 6 months.  Hospital visits:  None in previous 6 months  Medications: Outpatient Encounter Medications as of 01/06/2021  Medication Sig   Accu-Chek FastClix Lancets MISC 1 each by Does not apply route daily.   Accu-Chek Softclix Lancets lancets USE AS DIRECTED TO CHECK BLOOD SUGAR TWICE A DAY   amLODipine (NORVASC) 10 MG tablet TAKE ONE TABLET BY MOUTH EVERY MORNING FOR BLOOD PRESSURE (Patient taking differently: Take 10 mg by mouth daily.)   amoxicillin (AMOXIL) 500 MG capsule Take 1 capsule (500 mg total) by mouth 3 (three) times daily.   ASPIRIN LOW DOSE 81 MG EC tablet TAKE 1 TABLET BY MOUTH EVERY DAY (Patient taking differently: Take 81 mg by mouth daily.)   Blood Glucose Monitoring Suppl (ACCU-CHEK GUIDE) w/Device KIT Please specify directions, refills and quantity   carboxymethylcellulose (REFRESH PLUS) 0.5 % SOLN Place 1 drop into both eyes daily.   Catheters (SELF-CATH SYSTEM STRAIGHT TIP) KIT 1 each by Does not apply route as needed.   chlorhexidine (PERIDEX) 0.12 % solution 15 mLs 2 (two) times daily.   Cholecalciferol 25 MCG (1000 UT) capsule Take 1,000 Units by mouth daily.    Continuous Blood Gluc Receiver (FREESTYLE LIBRE 2 READER) DEVI 1 each by Does not apply route daily.   Continuous Blood Gluc Sensor (FREESTYLE LIBRE 2 SENSOR) MISC 1 each by Does not apply route daily.   cyanocobalamin 1000 MCG tablet Take 1,000 mcg by mouth daily.    estradiol (ESTRACE) 0.1 MG/GM vaginal cream Place 0.5 g vaginally 2 (two) times a week. Place 0.5g nightly for two weeks then  twice a week after (Patient taking differently: Place 0.5 g vaginally 2 (two) times a week.)   ferrous sulfate 325 (65 FE) MG EC tablet Take 1 tablet (325 mg total) by mouth in the morning and at bedtime.   glucose blood (ACCU-CHEK GUIDE) test strip 1 each by Other route in the morning and at bedtime. Dx E11.9   hydrochlorothiazide (HYDRODIURIL) 25 MG tablet TAKE ONE TABLET BY MOUTH EVERY MORNING FOR fluid AND FOR BLOOD PRESSURE (Patient taking differently: Take 25 mg by mouth daily.)   lidocaine (XYLOCAINE) 2 % jelly Apply 1 application topically as needed. (Patient taking differently: Apply 1 application topically daily as needed (pain).)   metFORMIN (GLUCOPHAGE) 500 MG tablet TAKE ONE TABLET BY MOUTH EVERY MORNING FOR diabetes (Patient taking differently: Take 500 mg by mouth daily with breakfast.)   oxyCODONE (OXY IR/ROXICODONE) 5 MG immediate release tablet Take 1 tablet (5 mg total) by mouth every 4 (four) hours as needed.   REPATHA SURECLICK 622 MG/ML SOAJ INJECT 113m into THE SKIN every 14 DAYS FOR cholesterol (Patient taking differently: Inject 140 mg into the skin every 14 (fourteen) days.)   tamsulosin (FLOMAX) 0.4 MG CAPS capsule Take 1 capsule (0.4 mg total) by mouth daily.   triamcinolone cream (KENALOG) 0.1 % APPLY TO THE AFFECTED AREA(S) TWICE DAILY FOR SKIN condition (Patient taking differently: Apply 1 application topically daily as needed (Dry skin).)   Trospium Chloride 60 MG CP24 Take 1 capsule (60 mg total) by mouth daily. (  Patient taking differently: Take 60 mg by mouth daily.)   Facility-Administered Encounter Medications as of 01/06/2021  Medication   sodium chloride flush (NS) 0.9 % injection 3 mL   Fill History: ASPIRIN EC 81 MG TABLET 09/25/2019 90   estradiol 0.01% (0.1 mg/gram) vaginal cream 11/12/2020 30   Repatha SureClick 140 mg/mL subcutaneous pen injector 12/10/2020 28   lidocaine HCl 2 % mucosal jelly 09/04/2020 30   MELOXICAM  15 MG TABS 06/13/2020 30    tamsulosin 0.4 mg capsule 12/10/2020 30   triamcinolone acetonide 0.1 % topical cream 12/10/2020 30   trospium ER 60 mg capsule,extended release 24 hr 12/10/2020 30   amlodipine 10 mg tablet 11/12/2020 30   hydrochlorothiazide 25 mg tablet 11/12/2020 30   metformin 500 mg tablet 11/12/2020 30   OXYCODONE HCL (IR) 5 MG TABLET 10/20/2020 5    Reviewed chart for medication changes ahead of medication coordination call.  No OVs, Consults, or hospital visits since last care coordination call/Pharmacist visit. (If appropriate, list visit date, provider name)  No medication changes indicated OR if recent visit, treatment plan here.  BP Readings from Last 3 Encounters:  12/29/20 (!) 180/72  10/20/20 (!) 173/75  09/29/20 (!) 199/84    Lab Results  Component Value Date   HGBA1C 6.3 08/15/2020     Patient obtains medications through Adherence Packaging  30 Days   Last adherence delivery included: Repatha Sureclick 140 MG/ML: inject every 14 days Triamcinolone cream (KENALOG) 0.1 %: apply to affected areas twice daily   Patient declined (meds) last month due to PRN use/additional supply on hand due to having an extra box and had been skipping some. Patient stated she will not need medications below until next months delivery. Amlodipine 10 mg: one tablet at breakfast Aspirin 81 mg: one tablet at breakfast Vitamin D3 MCG 1000-unit: one capsule at breakfast Vitamin B-12 Daily 1000 MCG: one tablet at breakfast Hydrochlorothiazide 25 mg: one tablet at breakfast Metformin 500 mg: one tablet at breakfast Trospium Chloride 60 mg Oral Daily Tamsulosin HCl 0.4 mg: one capsule at breakfast Ferrous sulfate 325 (65 FE) MG EC tablet: one tablet at breakfast and one tablet at bedtime Estradiol 0.5 g Vaginal 2 times weekly, Place 0.5 g nightly for two weeks then twice a week after  Patient is due for next adherence delivery on: 01/14/21. Called patient and reviewed medications and  coordinated delivery.  This delivery to include: Repatha Sureclick 140 MG/ML: inject every 14 days Triamcinolone cream (KENALOG) 0.1 %: apply to affected areas twice daily  Patient declined the following medications due to having an extra box and had been skipping some. Patient stated she will not need medications below until next months delivery. Amlodipine 10 mg: one tablet at breakfast Aspirin 81 mg: one tablet at breakfast Vitamin D3 MCG 1000-unit: one capsule at breakfast Vitamin B-12 Daily 1000 MCG: one tablet at breakfast Hydrochlorothiazide 25 mg: one tablet at breakfast Metformin 500 mg: one tablet at breakfast Trospium Chloride 60 mg Oral Daily Tamsulosin HCl 0.4 mg: one capsule at breakfast Ferrous sulfate 325 (65 FE) MG EC tablet: one tablet at breakfast and one tablet at bedtime Estradiol 0.5 g Vaginal 2 times weekly, Place 0.5 g nightly for two weeks then twice a week after  Confirmed delivery date of 01/14/21, advised patient that pharmacy will contact them the morning of delivery.   Care Gaps:  AWV - message sent to Robin Hayes CMA to schedule.  Ophthalmology exam - never done Zoster   vaccines [facility]) - never done DEXA SCAN -  never done Urine microalbumin - overdue since 06/23/19 Foot exam - overdue since 10/28//21 Covid-19 vaccine -  booster 4 overdue since 09/26/20 Influenza vaccine - overdue since 01/05/21  Star Rating Drugs:  Metformin 565m - [facility]20 [facility] [place] [place] ([phone]

## 2021-01-08 ENCOUNTER — Telehealth: Payer: Self-pay | Admitting: Internal Medicine

## 2021-01-08 NOTE — Telephone Encounter (Signed)
Angel from [place] called to inquire about a fax sent over for a incontinence refill. She advise it was sent 3 times to our fax in July and stated she will be sending it again in case we did not get it.

## 2021-01-09 NOTE — Telephone Encounter (Signed)
Spoke with patient and she does not want the supplies requested.

## 2021-01-14 ENCOUNTER — Other Ambulatory Visit: Payer: Self-pay

## 2021-01-14 ENCOUNTER — Encounter (HOSPITAL_BASED_OUTPATIENT_CLINIC_OR_DEPARTMENT_OTHER): Payer: Self-pay | Admitting: Obstetrics and Gynecology

## 2021-01-14 NOTE — Progress Notes (Signed)
Spoke w/ via phone for pre-op interview--- Pt Lab needs dos----  CBC, CMP (per anes)/  pre-op orders pending   Pt has appt w/ liver care 01-15-2021 possible she may have labs done, check in epic/ care everywhere, if so she will not need lab done.            Lab results------ current ekg in epic/ chart COVID test -----patient states asymptomatic no test needed Arrive at -------  1000 on 01-19-2021 NPO after MN NO Solid Food.  Clear liquids from MN until--- 0900 Med rec completed Medications to take morning of surgery ----- Norvasc Diabetic medication ----- do not take metformin morning of surgery  Patient instructed no nail polish to be worn day of surgery Patient instructed to bring photo id and insurance card day of surgery Patient aware to have Driver (ride ) / caregiver  for 24 hours after surgery --husband, Gregary Signs Patient Special Instructions ----- n/a Pre-Op special Istructions ----- sent inbox message to dr schroeder in epic, requested orders Patient verbalized understanding of instructions that were given at this phone interview. Patient denies shortness of breath, chest pain, fever, cough at this phone interview.    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  PCP: Dr Rockney Ghee (lov 08-15-2020 epic) Cardiologist : Dr Gwenlyn Found (lov 04-15-2020 epic) Vascular:  Dagoberto Ligas PA (lov 07-16-2020 epic) [facility]:  Roosevelt L[place] NP (lov 07-18-2020 epic,  pt has next appt 01-15-2021 note will be in epic)  Chest x-ray : 02-06-2019 epic EKG : 03-18-2020 epic Echo : 03-12-2020 epic Stress test: no Cardiac Cath :  03-25-2020  epic Abd [place]:  08-05-2020 epic  Activity level: denies sob w/ any activity Sleep Study/ CPAP : NO Fasting Blood Sugar :  unsure    / Checks Blood Sugar -- times a day:  twice weekly  Blood Thinner/ Instructions Maryjane Hurter Dose: no ASA / Instructions/ Last Dose :  ASA '81mg'$  Per pt was not given any instructions if need to stop taking prior to surgery, advised to call Dr Wannetta Sender office

## 2021-01-15 DIAGNOSIS — I8500 Esophageal varices without bleeding: Secondary | ICD-10-CM | POA: Diagnosis not present

## 2021-01-15 DIAGNOSIS — K7469 Other cirrhosis of liver: Secondary | ICD-10-CM | POA: Diagnosis not present

## 2021-01-15 NOTE — H&P (Signed)
[facility] Pre-Operative H&P  Subjective Chief Complaint: Deborah Wise presents for a preoperative encounter.   History of Present Illness: Deborah Wise is a 70 y.o. female who presents for preoperative visit.  She has neurogenic bladder and bladder outlet obstruction.  Routine bladder US showed bladder wall thickening. Cystoscopy in the office was unable to be performed and decision was made for cystoscopy under anesthesia.   Past Medical History:  Diagnosis Date   Bilateral carotid artery stenosis    vascular-- Dagoberto Ligas PA --- last duplex in epic 10-06-2020 right ICA 60-79% and left ICA 40-59%   Bladder outlet obstruction    self caths   Bladder wall thickening    Coronary artery disease    cardiologist--- dr Gwenlyn Found---- cath 03-25-2020 showed proxRCA 75%, distal RCA 50%, and midLAD 60%,  managed medically   Detrusor overactivity    urinary bladder   Esophageal varices ([place])    followed by dr Henrene Pastor---  nonbleeding   Fibroid, uterine    Full dentures    Hepatic cirrhosis due to chronic hepatitis C infection ([place])    followed by [place] NP---  s/p treated chronic hep c with cure and complicated by portal hypertension and esophageal varies   History of CVA (cerebrovascular accident) without residual deficits 06/2016   neurologist--- Frann Rider---  hospital admission 06-27-2016 right Pontine infart with  left MCA aneurysm,  no residual per pt   History of hepatitis C    post treatement and cured   Hypertension    IDA (iron deficiency anemia)    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 ;  followed by neurologist   Peripheral vascular disease ([place])    Self-catheterizes urinary bladder    Thrombocytopenia ([place]) 12/25/2014   Type 2 diabetes mellitus ([place])    followed by pcp---- (01-14-2021 pt stated check blood sugar at home twice weekly fasting, did not know average)   Vaginal atrophy      Past Surgical History:   Procedure Laterality Date   CESAREAN SECTION  1973   WITH BILATERAL TUBAL LIGATION   COLONOSCOPY  2017   PER PT SCHEDULED FOR NEXT ONE 02-02-2021   ESOPHAGOGASTRODUODENOSCOPY  11/27/2018   LAST ONE BY DR PERRY   IR GENERIC HISTORICAL  06/30/2016   IR ANGIO VERTEBRAL SEL SUBCLAVIAN INNOMINATE UNI R MOD SED 06/30/2016 [name], MD [facility]   IR GENERIC HISTORICAL  06/30/2016   IR ANGIO VERTEBRAL SEL VERTEBRAL UNI L MOD SED 06/30/2016 [name], MD [facility]   IR GENERIC HISTORICAL  06/30/2016   IR ANGIO INTRA EXTRACRAN SEL COM CAROTID INNOMINATE BILAT MOD SED 06/30/2016 [name], MD [facility]   LEFT HEART CATH AND CORONARY ANGIOGRAPHY N/A 03/25/2020   Procedure: LEFT HEART CATH AND CORONARY ANGIOGRAPHY;  Surgeon: Jettie Booze, MD;  Location: [facility];  Service: Cardiovascular;  Laterality: N/A;   MULTIPLE EXTRACTIONS WITH ALVEOLOPLASTY  10/25/2011   Procedure: MULTIPLE EXTRACION WITH ALVEOLOPLASTY;  Surgeon: Gae Bon, DDS;  Location: [facility];  Service: Oral Surgery;  Laterality: Bilateral;  Extract teeth numbers one, two, six, seven, eight, nine, ten, eleven, twelve, fifteen, sixteen, eighteen, nineteen, twenty-three, twenty-four, twenty-five, twenty-seven, thirty-two and alveoplasty.   ORIF ANKLE FRACTURE Right 11/25/2007   TOOTH EXTRACTION N/A 10/20/2020   Procedure: MULTIPLE DENTAL RESTORATION/EXTRACTIONS, AVELOPLASTY;  Surgeon: Diona Browner, DMD;  Location: [facility];  Service: Oral Surgery;  Laterality: N/A;    has No Known Allergies.  Family History  Problem Relation Age of Onset   Diabetes type II Mother    Hypertension Mother    Stroke Mother    Cancer Father        patient does not know what type of cancer   Diabetes Daughter    Breast cancer Neg Hx    Colon polyps Neg Hx    Crohn's disease Neg Hx    Esophageal cancer Neg Hx    Rectal cancer Neg Hx    Stomach cancer Neg Hx    Colon cancer Neg Hx     Social  History   Tobacco Use   Smoking status: Never   Smokeless tobacco: Never  Vaping Use   Vaping Use: Never used  Substance Use Topics   Alcohol use: Yes    Alcohol/week: 2.0 standard drinks    Types: 2 Cans of beer per week    Comment: 01-14-2021 per pt on the weekend, two beers   Drug use: Not Currently    Comment: 01-14-2021 last use cocaine last year 2018     Review of Systems was negative for a full 10 system review except as noted in the History of Present Illness.  No current facility-administered medications for this encounter.  Current Outpatient Medications:    amLODipine (NORVASC) 10 MG tablet, TAKE ONE TABLET BY MOUTH EVERY MORNING FOR BLOOD PRESSURE (Patient taking differently: Take 10 mg by mouth daily.), Disp: 90 tablet, Rfl: 1   ASPIRIN LOW DOSE 81 MG EC tablet, TAKE 1 TABLET BY MOUTH EVERY DAY (Patient taking differently: Take 81 mg by mouth daily.), Disp: 90 tablet, Rfl: 1   carboxymethylcellulose (REFRESH PLUS) 0.5 % SOLN, Place 1 drop into both eyes daily., Disp: , Rfl:    chlorhexidine (PERIDEX) 0.12 % solution, Use as directed 15 mLs in the mouth or throat 2 (two) times daily., Disp: , Rfl:    Cholecalciferol 25 MCG (1000 UT) capsule, Take 1,000 Units by mouth daily. , Disp: , Rfl:    cyanocobalamin 1000 MCG tablet, Take 1,000 mcg by mouth daily. , Disp: , Rfl:    estradiol (ESTRACE) 0.1 MG/GM vaginal cream, Place 0.5 g vaginally 2 (two) times a week. Place 0.5g nightly for two weeks then twice a week after (Patient taking differently: Place 0.5 g vaginally 2 (two) times a week.), Disp: 30 g, Rfl: 11   ferrous sulfate 325 (65 FE) MG EC tablet, Take 1 tablet (325 mg total) by mouth in the morning and at bedtime. (Patient taking differently: Take 325 mg by mouth in the morning and at bedtime.), Disp: , Rfl: 3   hydrochlorothiazide (HYDRODIURIL) 25 MG tablet, TAKE ONE TABLET BY MOUTH EVERY MORNING FOR fluid AND FOR BLOOD PRESSURE (Patient taking differently: Take 25 mg  by mouth daily.), Disp: 90 tablet, Rfl: 1   lidocaine (XYLOCAINE) 2 % jelly, Apply 1 application topically as needed. (Patient taking differently: Apply 1 application topically as needed (pain).), Disp: 30 mL, Rfl: 5   metFORMIN (GLUCOPHAGE) 500 MG tablet, TAKE ONE TABLET BY MOUTH EVERY MORNING FOR diabetes (Patient taking differently: Take 500 mg by mouth daily with breakfast.), Disp: 180 tablet, Rfl: 1   REPATHA SURECLICK 097 MG/ML SOAJ, INJECT 155m into THE SKIN every 14 DAYS FOR cholesterol (Patient taking differently: Inject 140 mg into the skin every 14 (fourteen) days.), Disp: 2 mL, Rfl: 11   triamcinolone cream (KENALOG) 0.1 %, APPLY TO THE AFFECTED AREA(S) TWICE DAILY FOR SKIN condition (Patient taking differently: Apply 1 application  topically 2 (two) times daily as needed (Dry skin).), Disp: 80 g, Rfl: 4   Trospium Chloride 60 MG CP24, Take 1 capsule (60 mg total) by mouth daily. (Patient taking differently: Take 60 mg by mouth daily.), Disp: 30 capsule, Rfl: 5   Accu-Chek FastClix Lancets MISC, 1 each by Does not apply route daily., Disp: 100 each, Rfl: 3   Accu-Chek Softclix Lancets lancets, USE AS DIRECTED TO CHECK BLOOD SUGAR TWICE A DAY, Disp: 100 each, Rfl: 0   Blood Glucose Monitoring Suppl (ACCU-CHEK GUIDE) w/Device KIT, Please specify directions, refills and quantity, Disp: 1 kit, Rfl: 0   Catheters (SELF-CATH SYSTEM STRAIGHT TIP) KIT, 1 each by Does not apply route as needed., Disp: 120 kit, Rfl: 11   Continuous Blood Gluc Receiver (FREESTYLE LIBRE 2 READER) DEVI, 1 each by Does not apply route daily. (Patient not taking: Reported on 01/14/2021), Disp: 2 each, Rfl: 11   Continuous Blood Gluc Sensor (FREESTYLE LIBRE 2 SENSOR) MISC, 1 each by Does not apply route daily. (Patient not taking: Reported on 01/14/2021), Disp: 2 each, Rfl: 11   glucose blood (ACCU-CHEK GUIDE) test strip, 1 each by Other route in the morning and at bedtime. Dx E11.9, Disp: 100 strip, Rfl: 0   tamsulosin  (FLOMAX) 0.4 MG CAPS capsule, Take 1 capsule (0.4 mg total) by mouth daily. (Patient not taking: Reported on 01/14/2021), Disp: 30 capsule, Rfl: 5   Objective   Previous Exam showed: BP: (!) 188/82  Pulse: 84  Weight: 174 lb (78.9 kg)  Height: _0  (1.651 m)    Gen: No apparent distress, A&O x 3.    Assessment/ Plan  Assessment: The patient is a 70 y.o. year old with neurogenic bladder and BOO.   Plan: Cystoscopy with possible biopsy  [name], MD

## 2021-01-19 ENCOUNTER — Ambulatory Visit (HOSPITAL_BASED_OUTPATIENT_CLINIC_OR_DEPARTMENT_OTHER): Payer: Medicare Other | Admitting: Anesthesiology

## 2021-01-19 ENCOUNTER — Encounter (HOSPITAL_BASED_OUTPATIENT_CLINIC_OR_DEPARTMENT_OTHER): Payer: Self-pay | Admitting: Obstetrics and Gynecology

## 2021-01-19 ENCOUNTER — Telehealth: Payer: Self-pay | Admitting: Obstetrics and Gynecology

## 2021-01-19 ENCOUNTER — Other Ambulatory Visit: Payer: Self-pay

## 2021-01-19 ENCOUNTER — Encounter (HOSPITAL_BASED_OUTPATIENT_CLINIC_OR_DEPARTMENT_OTHER)
Admission: RE | Disposition: A | Discharge status: Home / Self Care | Payer: Self-pay | Source: Ambulatory Visit | Attending: Obstetrics and Gynecology

## 2021-01-19 ENCOUNTER — Ambulatory Visit (HOSPITAL_BASED_OUTPATIENT_CLINIC_OR_DEPARTMENT_OTHER)
Admission: RE | Admit: 2021-01-19 | Discharge: 2021-01-19 | Disposition: A | Discharge status: Home / Self Care | Payer: Medicare Other | Source: Ambulatory Visit | Attending: Obstetrics and Gynecology | Admitting: Obstetrics and Gynecology

## 2021-01-19 DIAGNOSIS — Z7982 Long term (current) use of aspirin: Secondary | ICD-10-CM | POA: Insufficient documentation

## 2021-01-19 DIAGNOSIS — I2510 Atherosclerotic heart disease of native coronary artery without angina pectoris: Secondary | ICD-10-CM | POA: Diagnosis not present

## 2021-01-19 DIAGNOSIS — Z8619 Personal history of other infectious and parasitic diseases: Secondary | ICD-10-CM | POA: Diagnosis not present

## 2021-01-19 DIAGNOSIS — Z8673 Personal history of transient ischemic attack (TIA), and cerebral infarction without residual deficits: Secondary | ICD-10-CM | POA: Diagnosis not present

## 2021-01-19 DIAGNOSIS — I10 Essential (primary) hypertension: Secondary | ICD-10-CM | POA: Diagnosis not present

## 2021-01-19 DIAGNOSIS — Z7984 Long term (current) use of oral hypoglycemic drugs: Secondary | ICD-10-CM | POA: Insufficient documentation

## 2021-01-19 DIAGNOSIS — N3289 Other specified disorders of bladder: Secondary | ICD-10-CM | POA: Diagnosis not present

## 2021-01-19 DIAGNOSIS — K7460 Unspecified cirrhosis of liver: Secondary | ICD-10-CM | POA: Diagnosis not present

## 2021-01-19 DIAGNOSIS — N319 Neuromuscular dysfunction of bladder, unspecified: Secondary | ICD-10-CM | POA: Diagnosis not present

## 2021-01-19 DIAGNOSIS — Z79899 Other long term (current) drug therapy: Secondary | ICD-10-CM | POA: Insufficient documentation

## 2021-01-19 DIAGNOSIS — E1151 Type 2 diabetes mellitus with diabetic peripheral angiopathy without gangrene: Secondary | ICD-10-CM | POA: Insufficient documentation

## 2021-01-19 DIAGNOSIS — R339 Retention of urine, unspecified: Secondary | ICD-10-CM | POA: Diagnosis not present

## 2021-01-19 DIAGNOSIS — Z8249 Family history of ischemic heart disease and other diseases of the circulatory system: Secondary | ICD-10-CM | POA: Insufficient documentation

## 2021-01-19 DIAGNOSIS — E1140 Type 2 diabetes mellitus with diabetic neuropathy, unspecified: Secondary | ICD-10-CM | POA: Diagnosis not present

## 2021-01-19 DIAGNOSIS — Z833 Family history of diabetes mellitus: Secondary | ICD-10-CM | POA: Diagnosis not present

## 2021-01-19 DIAGNOSIS — N320 Bladder-neck obstruction: Secondary | ICD-10-CM | POA: Insufficient documentation

## 2021-01-19 DIAGNOSIS — B182 Chronic viral hepatitis C: Secondary | ICD-10-CM | POA: Diagnosis not present

## 2021-01-19 HISTORY — DX: Other specified disorders of bladder: N32.89

## 2021-01-19 HISTORY — DX: Presence of dental prosthetic device (complete) (partial): K08.109

## 2021-01-19 HISTORY — DX: Other specified health status: Z78.9

## 2021-01-19 HISTORY — DX: Postmenopausal atrophic vaginitis: N95.2

## 2021-01-19 HISTORY — DX: Type 2 diabetes mellitus without complications: E11.9

## 2021-01-19 HISTORY — DX: Occlusion and stenosis of bilateral carotid arteries: I65.23

## 2021-01-19 HISTORY — DX: Overactive bladder: N32.81

## 2021-01-19 HISTORY — DX: Bladder-neck obstruction: N32.0

## 2021-01-19 HISTORY — DX: Unspecified cirrhosis of liver: B18.2

## 2021-01-19 HISTORY — DX: Esophageal varices without bleeding: I85.00

## 2021-01-19 HISTORY — PX: CYSTOSCOPY: SHX5120

## 2021-01-19 HISTORY — DX: Iron deficiency anemia, unspecified: D50.9

## 2021-01-19 HISTORY — DX: Personal history of other infectious and parasitic diseases: Z86.19

## 2021-01-19 LAB — COMPREHENSIVE METABOLIC PANEL
ALT: 103 U/L — ABNORMAL HIGH (ref 0–44)
AST: 143 U/L — ABNORMAL HIGH (ref 15–41)
Albumin: 4.2 g/dL (ref 3.5–5.0)
Alkaline Phosphatase: 85 U/L (ref 38–126)
Anion gap: 7 (ref 5–15)
BUN: 13 mg/dL (ref 8–23)
CO2: 24 mmol/L (ref 22–32)
Calcium: 9.6 mg/dL (ref 8.9–10.3)
Chloride: 107 mmol/L (ref 98–111)
Creatinine, Ser: 0.76 mg/dL (ref 0.44–1.00)
GFR, Estimated: >60 mL/min (ref >60)
Glucose, Bld: 124 mg/dL — ABNORMAL HIGH (ref 70–99)
Potassium: 4.8 mmol/L (ref 3.5–5.1)
Sodium: 138 mmol/L (ref 135–145)
Total Bilirubin: 1.3 mg/dL — ABNORMAL HIGH (ref 0.3–1.2)
Total Protein: 7.9 g/dL (ref 6.5–8.1)

## 2021-01-19 LAB — CBC
HCT: 38.8 % (ref 36.0–46.0)
Hemoglobin: 12.4 g/dL (ref 12.0–15.0)
MCH: 24.6 pg — ABNORMAL LOW (ref 26.0–34.0)
MCHC: 32 g/dL (ref 30.0–36.0)
MCV: 77 fL — ABNORMAL LOW (ref 80.0–100.0)
Platelets: 133 10*3/uL — ABNORMAL LOW (ref 150–400)
RBC: 5.04 MIL/uL (ref 3.87–5.11)
RDW: 15.2 % (ref 11.5–15.5)
WBC: 4.8 10*3/uL (ref 4.0–10.5)
nRBC: 0 % (ref 0.0–0.2)

## 2021-01-19 LAB — GLUCOSE, CAPILLARY: Glucose-Capillary: 137 mg/dL — ABNORMAL HIGH (ref 70–99)

## 2021-01-19 SURGERY — CYSTOSCOPY
Anesthesia: General | Site: Bladder

## 2021-01-19 MED ORDER — PHENYLEPHRINE 40 MCG/ML (10ML) SYRINGE FOR IV PUSH (FOR BLOOD PRESSURE SUPPORT)
PREFILLED_SYRINGE | INTRAVENOUS | Status: DC | PRN
  Administered 2021-01-19: 40 ug via INTRAVENOUS

## 2021-01-19 MED ORDER — CEFAZOLIN SODIUM-DEXTROSE 2-4 GM/100ML-% IV SOLN
INTRAVENOUS | Status: AC
  Filled 2021-01-19: qty 100

## 2021-01-19 MED ORDER — LIDOCAINE HCL (PF) 2 % IJ SOLN
INTRAMUSCULAR | Status: AC
  Filled 2021-01-19: qty 5

## 2021-01-19 MED ORDER — POVIDONE-IODINE 10 % EX SWAB: 2.0000 "application " | Freq: Once | CUTANEOUS | Status: DC

## 2021-01-19 MED ORDER — PROPOFOL 10 MG/ML IV BOLUS
INTRAVENOUS | Status: DC | PRN
  Administered 2021-01-19: 120 mg via INTRAVENOUS

## 2021-01-19 MED ORDER — DEXAMETHASONE SODIUM PHOSPHATE 10 MG/ML IJ SOLN
INTRAMUSCULAR | Status: AC
  Filled 2021-01-19: qty 1

## 2021-01-19 MED ORDER — CEFAZOLIN SODIUM-DEXTROSE 2-4 GM/100ML-% IV SOLN
2.0000 g | INTRAVENOUS | Status: AC
  Administered 2021-01-19: 2 g via INTRAVENOUS

## 2021-01-19 MED ORDER — WATER FOR IRRIGATION, STERILE IR SOLN
Status: DC | PRN
  Administered 2021-01-19: 3000 mL

## 2021-01-19 MED ORDER — FENTANYL CITRATE (PF) 100 MCG/2ML IJ SOLN: 25.0000 ug | INTRAMUSCULAR | Status: DC | PRN

## 2021-01-19 MED ORDER — LACTATED RINGERS IV SOLN: INTRAVENOUS | Status: DC

## 2021-01-19 MED ORDER — ONDANSETRON HCL 4 MG/2ML IJ SOLN
INTRAMUSCULAR | Status: AC
  Filled 2021-01-19: qty 2

## 2021-01-19 MED ORDER — ONDANSETRON HCL 4 MG/2ML IJ SOLN: 4.0000 mg | Freq: Once | INTRAMUSCULAR | Status: DC | PRN

## 2021-01-19 MED ORDER — OXYCODONE HCL 5 MG/5ML PO SOLN: 5.0000 mg | Freq: Once | ORAL | Status: DC | PRN

## 2021-01-19 MED ORDER — DEXAMETHASONE SODIUM PHOSPHATE 10 MG/ML IJ SOLN
INTRAMUSCULAR | Status: DC | PRN
  Administered 2021-01-19: 10 mg via INTRAVENOUS

## 2021-01-19 MED ORDER — OXYCODONE HCL 5 MG PO TABS: 5.0000 mg | ORAL_TABLET | Freq: Once | ORAL | Status: DC | PRN

## 2021-01-19 MED ORDER — PROPOFOL 10 MG/ML IV BOLUS
INTRAVENOUS | Status: AC
  Filled 2021-01-19: qty 20

## 2021-01-19 MED ORDER — HYDRALAZINE HCL 20 MG/ML IJ SOLN
INTRAMUSCULAR | Status: AC
  Filled 2021-01-19: qty 1

## 2021-01-19 MED ORDER — LIDOCAINE 2% (20 MG/ML) 5 ML SYRINGE
INTRAMUSCULAR | Status: DC | PRN
  Administered 2021-01-19: 80 mg via INTRAVENOUS

## 2021-01-19 MED ORDER — FENTANYL CITRATE (PF) 100 MCG/2ML IJ SOLN
INTRAMUSCULAR | Status: AC
  Filled 2021-01-19: qty 2

## 2021-01-19 MED ORDER — FENTANYL CITRATE (PF) 100 MCG/2ML IJ SOLN
INTRAMUSCULAR | Status: DC | PRN
  Administered 2021-01-19 (×2): 25 ug via INTRAVENOUS
  Administered 2021-01-19: 50 ug via INTRAVENOUS

## 2021-01-19 MED ORDER — HYDRALAZINE HCL 20 MG/ML IJ SOLN
5.0000 mg | INTRAMUSCULAR | Status: DC | PRN
  Administered 2021-01-19 (×2): 5 mg via INTRAVENOUS

## 2021-01-19 MED ORDER — ONDANSETRON HCL 4 MG/2ML IJ SOLN
INTRAMUSCULAR | Status: DC | PRN
  Administered 2021-01-19: 4 mg via INTRAVENOUS

## 2021-01-19 SURGICAL SUPPLY — 8 items
ELECT REM PT RETURN 9FT ADLT (ELECTROSURGICAL) ×2
ELECTRODE REM PT RTRN 9FT ADLT (ELECTROSURGICAL) ×1 IMPLANT
GLOVE SURG ENC MOIS LTX SZ6 (GLOVE) IMPLANT
GLOVE SURG UNDER POLY LF SZ6.5 (GLOVE) IMPLANT
GOWN STRL REUS W/TWL LRG LVL3 (GOWN DISPOSABLE) IMPLANT
KIT TURNOVER CYSTO (KITS) ×2 IMPLANT
MANIFOLD NEPTUNE II (INSTRUMENTS) ×2 IMPLANT
PACK CYSTO (CUSTOM PROCEDURE TRAY) IMPLANT

## 2021-01-19 NOTE — Discharge Instructions (Addendum)
POST OPERATIVE INSTRUCTIONS  General Instructions You can return to your normal activity tomorrow Continue to self catheterize at least 4-5 times per day  Taking your medications - If you have any burning pain after surgery, you can take Azo (pyridium) over the counter- this turns your urine an orange color.  Reasons to Call the Nurse (see last page for phone numbers) Heavy Bleeding  Persistent nausea/vomiting Fever (100.4 degrees or more)  Post op concerns  For non-emergent issues, please call the Urogynecology Nurse. Please leave a message and someone will contact you within one business day.  You can also send a message through [place].   AFTER HOURS (After 5:00 PM and on weekends):  For urgent matters that cannot wait until the next business day. Call our office [phone] and connect to the doctor on call.  Please reserve this for important issues.   **FOR ANY TRUE EMERGENCY ISSUES CALL 911 OR GO TO THE NEAREST EMERGENCY ROOM.** Please inform our office or the doctor on call of any emergency.     APPOINTMENTS: Call [phone]     Post Anesthesia Home Care Instructions  Activity: Get plenty of rest for the remainder of the day. A responsible individual must stay with you for 24 hours following the procedure.  For the next 24 hours, DO NOT: -Drive a car -Paediatric nurse -Drink alcoholic beverages -Take any medication unless instructed by your physician -Make any legal decisions or sign important papers.  Meals: Start with liquid foods such as gelatin or soup. Progress to regular foods as tolerated. Avoid greasy, spicy, heavy foods. If nausea and/or vomiting occur, drink only clear liquids until the nausea and/or vomiting subsides. Call your physician if vomiting continues.  Special Instructions/Symptoms: Your throat may feel dry or sore from the anesthesia or the breathing tube placed in your throat during surgery. If this causes discomfort, gargle with warm salt  water. The discomfort should disappear within 24 hours.

## 2021-01-19 NOTE — Interval H&P Note (Signed)
History and Physical Interval Note:  01/19/2021 11:01 AM  Deborah Wise  has presented today for surgery, with the diagnosis of bladder wall thickening.  The various methods of treatment have been discussed with the patient and family. After consideration of risks, benefits and other options for treatment, the patient has consented to  Procedure(s): CYSTOSCOPY (N/A) with possible biopsy as a surgical intervention.  The patient's history has been reviewed, patient examined, no change in status, stable for surgery.  I have reviewed the patient's chart and labs.  Questions were answered to the patient's satisfaction.     [name]

## 2021-01-19 NOTE — Telephone Encounter (Signed)
Deborah Wise underwent cystoscopy on 01/19/21.   She did not have a voiding trial- she self-catheterizes.   Please call her for a routine post op check on 01/20/21. Thanks!  [name], MD

## 2021-01-19 NOTE — Op Note (Signed)
Operative Note  Preoperative Diagnosis:  urinary retention with bladder wall thickening  Postoperative Diagnosis: same  Procedures performed:  Cystoscopy  Attending Surgeon: Sherlene Shams, MD   Anesthesia: General LMA  Findings: 1. 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   2. Normal bladder mucosa with trabeculations throughout  3. Brisk bilateral ureteral efflux   Specimens: none  Estimated blood loss: 1 mL  IV fluids: 100 mL  Urine output: [id number] mL  Complications: none  Indications: Patient has a history of incomplete bladder emptying due to bladder outlet obstruction. Routine bladder ultrasound showed bladder wall thickening. Cystoscopy was attempted in the office but could not be passed. Therefore, she was taken to the operating room for a cystoscopy under anesthesia.   Procedure in Detail: After informed consent was obtained, the patient was taken to the operating room where she was placed under anesthesia.  She was then placed in the dorsal lithotomy position with allen stirrups and prepped and draped in the usual sterile fashion. Care was taken to avoid hyperflexion or hyperextension of her upper and lower extremities. A flexible cystoscopy was introduced into the urethra. An area of urethral dilation was noted distally, with a more strictured area proximally. A 360-degree inspection revealed no bladder mucosal lesion. Trabeculations were noted throughout. There was brisk bilateral ureteral efflux. The cystoscope was removed and the bladder was drained with a red rubber catheter.  She was awakened from anesthesia and transferred to the recovery room in stable condition. All counts were correct x 2.    [name], MD

## 2021-01-19 NOTE — Anesthesia Procedure Notes (Signed)
Procedure Name: LMA Insertion Date/Time: 01/19/2021 11:11 AM Performed by: Aviyana Sonntag D, CRNA Pre-anesthesia Checklist: Patient identified, Emergency Drugs available, Suction available and Patient being monitored Patient Re-evaluated:Patient Re-evaluated prior to induction Oxygen Delivery Method: Circle system utilized Preoxygenation: Pre-oxygenation with 100% oxygen Induction Type: IV induction Ventilation: Mask ventilation without difficulty LMA: LMA inserted LMA Size: 4.0 Tube type: Oral Number of attempts: 1 Placement Confirmation: positive ETCO2 and breath sounds checked- equal and bilateral Tube secured with: Tape Dental Injury: Teeth and Oropharynx as per pre-operative assessment

## 2021-01-19 NOTE — Anesthesia Preprocedure Evaluation (Addendum)
Anesthesia Evaluation  Patient identified by MRN, date of birth, ID band Patient awake    Reviewed: Allergy & Precautions, NPO status , Patient's Chart, lab work & pertinent test results  History of Anesthesia Complications Negative for: history of anesthetic complications  Airway Mallampati: II  TM Distance: >3 FB Neck ROM: Full    Dental  (+) Edentulous Upper, Edentulous Lower   Pulmonary neg pulmonary ROS,    Pulmonary exam normal        Cardiovascular hypertension, Pt. on medications + CAD and + Peripheral Vascular Disease  Normal cardiovascular exam   '22 Carotid US - 60-79% right ICAS, 40-59% left ICAS  '21 Cath - Prox RCA lesion is 75% stenosed. Dist RCA lesion is 50% stenosed. Mid LAD lesion is 60% stenosed. The left ventricular systolic function is normal. LV end diastolic pressure is normal. The left ventricular ejection fraction is 55-65% by visual estimate.  '21 TTE - EF 60 to 65%. Grade I diastolic dysfunction (impaired relaxation). Left atrial size was mildly dilated.  Mild aortic valve sclerosis is present, with no evidence of aortic valve stenosis.     Neuro/Psych CVA, No Residual Symptoms negative psych ROS   GI/Hepatic negative GI ROS, (+) Cirrhosis   Esophageal Varices    , Hepatitis -, C  Endo/Other  diabetes, Type 2, Oral Hypoglycemic Agents  Renal/GU negative Renal ROS     Musculoskeletal negative musculoskeletal ROS (+)   Abdominal   Peds  Hematology  (+) anemia ,  Thrombocytopenia    Anesthesia Other Findings   Reproductive/Obstetrics                           Anesthesia Physical Anesthesia Plan  ASA: 3  Anesthesia Plan: General   Post-op Pain Management:    Induction: Intravenous  PONV Risk Score and Plan: 3 and Treatment may vary due to age or medical condition, Ondansetron and Dexamethasone  Airway Management Planned: LMA  Additional  Equipment: None  Intra-op Plan:   Post-operative Plan: Extubation in OR  Informed Consent: I have reviewed the patients History and Physical, chart, labs and discussed the procedure including the risks, benefits and alternatives for the proposed anesthesia with the patient or authorized representative who has indicated his/her understanding and acceptance.     Dental advisory given  Plan Discussed with: CRNA and Anesthesiologist  Anesthesia Plan Comments:        Anesthesia Quick Evaluation

## 2021-01-19 NOTE — Transfer of Care (Signed)
Immediate Anesthesia Transfer of Care Note  Patient: Deborah Wise  Procedure(s) Performed: CYSTOSCOPY (Bladder)  Patient Location: PACU  Anesthesia Type:General  Level of Consciousness: awake, alert  and oriented  Airway & Oxygen Therapy: Patient Spontanous Breathing and Patient connected to nasal cannula oxygen  Post-op Assessment: Report given to RN and Post -op Vital signs reviewed and stable  Post vital signs: Reviewed and stable  Last Vitals:  Vitals Value Taken Time  BP 197/71 01/19/21 1215  Temp 36.4 C 01/19/21 1132  Pulse 63 01/19/21 1219  Resp 18 01/19/21 1219  SpO2 97 % 01/19/21 1219  Vitals shown include unvalidated device data.  Last Pain:  Vitals:   01/19/21 1132  TempSrc:   PainSc: 0-No pain         Complications: No notable events documented.

## 2021-01-19 NOTE — Anesthesia Postprocedure Evaluation (Signed)
Anesthesia Post Note  Patient: Deborah Wise  Procedure(s) Performed: CYSTOSCOPY (Bladder)     Patient location during evaluation: PACU Anesthesia Type: General Level of consciousness: awake and alert Pain management: pain level controlled Vital Signs Assessment: post-procedure vital signs reviewed and stable Respiratory status: spontaneous breathing, nonlabored ventilation and respiratory function stable Cardiovascular status: stable and blood pressure returned to baseline Anesthetic complications: no   No notable events documented.  Last Vitals:  Vitals:   01/19/21 1200 01/19/21 1215  BP: (!) 190/69 (!) 197/71  Pulse: 61 62  Resp: 15 20  Temp:    SpO2: 100% 98%    Last Pain:  Vitals:   01/19/21 1132  TempSrc:   PainSc: 0-No pain                 Audry Pili

## 2021-01-20 ENCOUNTER — Encounter (HOSPITAL_BASED_OUTPATIENT_CLINIC_OR_DEPARTMENT_OTHER): Payer: Self-pay | Admitting: Obstetrics and Gynecology

## 2021-01-20 NOTE — Telephone Encounter (Signed)
Pt underwent cystoscopy on 01/19/21. She denies any issues other than a headache. She states that it is hurting on the same side that she previously had an aneurysm on. No other complaints. Pt reports BP as 142/88 and has taken Tylenol for the headache. Advised pt to call us back if the headache does not improve. Pt verbalized understanding.

## 2021-01-22 ENCOUNTER — Ambulatory Visit (AMBULATORY_SURGERY_CENTER): Payer: Medicare Other | Admitting: *Deleted

## 2021-01-22 ENCOUNTER — Other Ambulatory Visit: Payer: Self-pay

## 2021-01-22 VITALS — Ht 64.0 in | Wt 167.0 lb

## 2021-01-22 DIAGNOSIS — K317 Polyp of stomach and duodenum: Secondary | ICD-10-CM

## 2021-01-22 DIAGNOSIS — I864 Gastric varices: Secondary | ICD-10-CM

## 2021-01-22 NOTE — Progress Notes (Signed)
No egg or soy allergy known to patient  No issues with past sedation with any surgeries or procedures Patient denies ever being told they had issues or difficulty with intubation  No FH of Malignant Hyperthermia No diet pills per patient No home 02 use per patient  No blood thinners per patient  Pt denies issues with constipation  No A fib or A flutter  EMMI video to pt or via [place] 19 guidelines implemented in [place] today with Pt and RN  Pt is fully vaccinated  for Covid   Due to the COVID-19 pandemic we are asking patients to follow certain guidelines.  Pt aware of COVID protocols and [facility] guidelines   Pt verified name, DOB, address and insurance during PV today. Pt mailed instruction packet to included paper to complete and mail back to [facility] with addressed and stamped envelope, Emmi video, copy of consent form to read and not return, and instructions. Pt encouraged to call with questions or issues.  My Chart instructions to pt as well

## 2021-01-23 ENCOUNTER — Encounter: Payer: Self-pay | Admitting: *Deleted

## 2021-01-28 ENCOUNTER — Other Ambulatory Visit: Payer: Self-pay | Admitting: Nurse Practitioner

## 2021-01-28 DIAGNOSIS — K7469 Other cirrhosis of liver: Secondary | ICD-10-CM

## 2021-02-02 ENCOUNTER — Encounter: Payer: Self-pay | Admitting: Internal Medicine

## 2021-02-02 ENCOUNTER — Other Ambulatory Visit: Payer: Self-pay | Admitting: Internal Medicine

## 2021-02-02 ENCOUNTER — Ambulatory Visit (AMBULATORY_SURGERY_CENTER): Payer: Medicare Other | Admitting: Internal Medicine

## 2021-02-02 ENCOUNTER — Other Ambulatory Visit: Payer: Self-pay

## 2021-02-02 VITALS — BP 186/87 | HR 84 | Temp 98.1°F | Resp 24

## 2021-02-02 DIAGNOSIS — I8500 Esophageal varices without bleeding: Secondary | ICD-10-CM

## 2021-02-02 DIAGNOSIS — D3A8 Other benign neuroendocrine tumors: Secondary | ICD-10-CM | POA: Diagnosis not present

## 2021-02-02 DIAGNOSIS — I864 Gastric varices: Secondary | ICD-10-CM | POA: Diagnosis not present

## 2021-02-02 DIAGNOSIS — K31A0 Gastric intestinal metaplasia, unspecified: Secondary | ICD-10-CM | POA: Diagnosis not present

## 2021-02-02 DIAGNOSIS — K317 Polyp of stomach and duodenum: Secondary | ICD-10-CM

## 2021-02-02 DIAGNOSIS — K2970 Gastritis, unspecified, without bleeding: Secondary | ICD-10-CM | POA: Diagnosis not present

## 2021-02-02 DIAGNOSIS — I10 Essential (primary) hypertension: Secondary | ICD-10-CM | POA: Diagnosis not present

## 2021-02-02 DIAGNOSIS — K7460 Unspecified cirrhosis of liver: Secondary | ICD-10-CM | POA: Diagnosis not present

## 2021-02-02 MED ORDER — SODIUM CHLORIDE 0.9 % IV SOLN: 500.0000 mL | Freq: Once | INTRAVENOUS | Status: DC

## 2021-02-02 MED ORDER — OMEPRAZOLE 40 MG PO CPDR: 40.0000 mg | DELAYED_RELEASE_CAPSULE | Freq: Every day | ORAL | 0 refills | Status: DC

## 2021-02-02 NOTE — Progress Notes (Signed)
Report to PACU, RN, vss, BBS= Clear.  

## 2021-02-02 NOTE — Progress Notes (Signed)
C.w. VITAL SIGNS. 

## 2021-02-02 NOTE — Progress Notes (Signed)
Pt's states no medical or surgical changes since previsit or office visit. 

## 2021-02-02 NOTE — Op Note (Signed)
[place] Patient Name: Deborah Wise Procedure Date: 02/02/2021 9:51 AM MRN: [id number] Endoscopist: Docia Chuck. Henrene Pastor , MD Age: 70 Referring MD:  Date of Birth: 09/05/1950 Gender: Female Account #: [id number] Procedure:                Upper GI endoscopy with snare polypectomy x 1 Indications:              Follow-up of esophageal varices. Previous                            examination June 2020 with trace varices. 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 Medicines:                Monitored Anesthesia Care Procedure:                Pre-Anesthesia Assessment:                           - Prior to the procedure, a History and Physical                            was performed, and patient medications and                            allergies were reviewed. The patient's tolerance of                            previous anesthesia was also reviewed. The risks                            and benefits of the procedure and the sedation                            options and risks were discussed with the patient.                            All questions were answered, and informed consent                            was obtained. Prior Anticoagulants: The patient has                            taken no previous anticoagulant or antiplatelet                            agents. ASA Grade Assessment: III - A patient with                            severe systemic disease. After reviewing the risks  and benefits, the patient was deemed in                            satisfactory condition to undergo the procedure.                           After obtaining informed consent, the endoscope was                            passed under direct vision. Throughout the                            procedure, the patient's blood pressure, pulse, and                            oxygen saturations were  monitored continuously. The                            Endoscope was introduced through the mouth, and                            advanced to the second part of duodenum. The upper                            GI endoscopy was accomplished without difficulty.                            The patient tolerated the procedure well. Scope In: Scope Out: Findings:                 The esophagus was normal. No appreciable varices                           The stomach revealed somewhat atrophic mucosa.                            There were several small inflammatory appearing                            polyps. There was a hyperemic polyp in the gastric                            body along the lesser curvature. This was removed                            and submitted for pathology.                           The examined duodenum was normal.                           The cardia and gastric fundus were normal on                            retroflexion. No varices.  Complications:            No immediate complications. Estimated Blood Loss:     Estimated blood loss: none. Impression:               1. No esophagogastric varices                           2. Gastric polyp status post polypectomy. Rule out                            carcinoid                           3. Otherwise unremarkable exam. Recommendation:           - Patient has a contact number available for                            emergencies. The signs and symptoms of potential                            delayed complications were discussed with the                            patient. Return to normal activities tomorrow.                            Written discharge instructions were provided to the                            patient.                           - Resume previous diet.                           - Continue present medications.                           - Await pathology results.                           - Repeat EGD in 2  years                           - Prescribe omeprazole 40 mg daily; #14. Take 1                            daily for 2 weeks post polypectomy Dayana Dalporto N. Henrene Pastor, MD 02/02/2021 10:15:36 AM This report has been signed electronically.

## 2021-02-02 NOTE — Progress Notes (Signed)
Called to room to assist during endoscopic procedure.  Patient ID and intended procedure confirmed with present staff. Received instructions for my participation in the procedure from the performing physician.  

## 2021-02-02 NOTE — Patient Instructions (Addendum)
Await pathology results for gastric polyp. Resume normal diet and repeat EGD in 2 years. Prescription for Omeprazole sent to pharmacy (once a day for 14 days).     YOU HAD AN ENDOSCOPIC PROCEDURE TODAY AT [facility]:   Refer to the procedure report that was given to you for any specific questions about what was found during the examination.  If the procedure report does not answer your questions, please call your gastroenterologist to clarify.  If you requested that your care partner not be given the details of your procedure findings, then the procedure report has been included in a sealed envelope for you to review at your convenience later.  YOU SHOULD EXPECT: Some feelings of bloating in the abdomen. Passage of more gas than usual.  Walking can help get rid of the air that was put into your GI tract during the procedure and reduce the bloating. If you had a lower endoscopy (such as a colonoscopy or flexible sigmoidoscopy) you may notice spotting of blood in your stool or on the toilet paper. If you underwent a bowel prep for your procedure, you may not have a normal bowel movement for a few days.  Please Note:  You might notice some irritation and congestion in your nose or some drainage.  This is from the oxygen used during your procedure.  There is no need for concern and it should clear up in a day or so.  SYMPTOMS TO REPORT IMMEDIATELY:  Following upper endoscopy (EGD)  Vomiting of blood or coffee ground material  New chest pain or pain under the shoulder blades  Painful or persistently difficult swallowing  New shortness of breath  Fever of 100F or higher  Black, tarry-looking stools  For urgent or emergent issues, a gastroenterologist can be reached at any hour by calling [phone]. Do not use MyChart messaging for urgent concerns.    DIET:  We do recommend a small meal at first, but then you may proceed to your regular diet.  Drink plenty of fluids but you  should avoid alcoholic beverages for 24 hours.  ACTIVITY:  You should plan to take it easy for the rest of today and you should NOT DRIVE or use heavy machinery until tomorrow (because of the sedation medicines used during the test).    FOLLOW UP: Our staff will call the number listed on your records 48-72 hours following your procedure to check on you and address any questions or concerns that you may have regarding the information given to you following your procedure. If we do not reach you, we will leave a message.  We will attempt to reach you two times.  During this call, we will ask if you have developed any symptoms of COVID 19. If you develop any symptoms (ie: fever, flu-like symptoms, shortness of breath, cough etc.) before then, please call [phone].  If you test positive for Covid 19 in the 2 weeks post procedure, please call and report this information to [place].    If any biopsies were taken you will be contacted by phone or by letter within the next 1-3 weeks.  Please call us at [phone] if you have not heard about the biopsies in 3 weeks.    SIGNATURES/CONFIDENTIALITY: You and/or your care partner have signed paperwork which will be entered into your electronic medical record.  These signatures attest to the fact that that the information above on your After Visit Summary has been reviewed and is understood.  Full responsibility of the confidentiality of this discharge information lies with you and/or your care-partner.  

## 2021-02-02 NOTE — Progress Notes (Signed)
GI PREPROCEDURE H&P  HISTORY OF PRESENT ILLNESS:  Deborah Wise is a 70 y.o. female who presents today for surveillance upper endoscopy.  She has a history of cirrhosis and small gastric varices.  Last upper endoscopy June 2020.  Also history of hyperplastic gastric polyp and small gastric carcinoid.  REVIEW OF SYSTEMS:  All non-GI ROS negative. Past Medical History:  Diagnosis Date   Bilateral carotid artery stenosis    vascular-- Deborah Ligas PA --- last duplex in epic 10-06-2020 right ICA 60-79% and left ICA 40-59%   Bladder outlet obstruction    self caths   Bladder wall thickening    Blood transfusion without reported diagnosis    Coronary artery disease    cardiologist--- dr Deborah Wise---- cath 03-25-2020 showed proxRCA 75%, distal RCA 50%, and midLAD 60%,  managed medically   Detrusor overactivity    urinary bladder   Esophageal varices ([place])    followed by dr Deborah Wise---  nonbleeding   Fibroid, uterine    Full dentures    Hepatic cirrhosis due to chronic hepatitis C infection (Deborah Wise)    followed by Deborah Wise---  s/p treated chronic hep c with cure and complicated by portal hypertension and esophageal varies   History of CVA (cerebrovascular accident) without residual deficits 06/2016   neurologist--- Deborah Wise---  hospital admission 06-27-2016 right [place] infart with  left MCA aneurysm,  no residual per pt   History of hepatitis C    post treatement and cured   Hyperlipidemia    Hypertension    IDA (iron deficiency anemia)    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 ;  followed by neurologist   Peripheral vascular disease (Deborah Wise)    Self-catheterizes urinary bladder    Stroke (Deborah Wise) 2018   Thrombocytopenia (Deborah Wise) 12/25/2014   Type 2 diabetes mellitus (Deborah Wise)    followed by pcp---- (01-14-2021 pt stated check blood sugar at home twice weekly fasting, did not know average)   Vaginal atrophy     Past Surgical History:  Procedure Laterality  Date   CESAREAN SECTION  1973   WITH BILATERAL TUBAL LIGATION   COLONOSCOPY  2017   PER PT SCHEDULED FOR NEXT ONE 02-02-2021   CYSTOSCOPY N/A 01/19/2021   Procedure: CYSTOSCOPY;  Surgeon: Deborah Folds, Wise;  Location: [facility];  Service: Gynecology;  Laterality: N/A;   ESOPHAGOGASTRODUODENOSCOPY  11/27/2018   LAST ONE BY DR Deborah Wise   IR GENERIC HISTORICAL  06/30/2016   IR ANGIO VERTEBRAL SEL SUBCLAVIAN INNOMINATE UNI R MOD SED 06/30/2016 Deborah Wise [facility]   IR GENERIC HISTORICAL  06/30/2016   IR ANGIO VERTEBRAL SEL VERTEBRAL UNI L MOD SED 06/30/2016 Deborah Wise [facility]   IR GENERIC HISTORICAL  06/30/2016   IR ANGIO INTRA EXTRACRAN SEL COM CAROTID INNOMINATE BILAT MOD SED 06/30/2016 Deborah Wise [facility]   LEFT HEART CATH AND CORONARY ANGIOGRAPHY N/A 03/25/2020   Procedure: LEFT HEART CATH AND CORONARY ANGIOGRAPHY;  Surgeon: Deborah Booze, Wise;  Location: [facility];  Service: Cardiovascular;  Laterality: N/A;   MULTIPLE EXTRACTIONS WITH ALVEOLOPLASTY  10/25/2011   Procedure: MULTIPLE EXTRACION WITH ALVEOLOPLASTY;  Surgeon: Deborah Wise, DDS;  Location: [place];  Service: Oral Surgery;  Laterality: Bilateral;  Extract teeth numbers one, two, six, seven, eight, nine, ten, eleven, twelve, fifteen, sixteen, eighteen, nineteen, twenty-three, twenty-four, twenty-five, twenty-seven, thirty-two and alveoplasty.   ORIF ANKLE FRACTURE Right 11/25/2007   TOOTH EXTRACTION  N/A 10/20/2020   Procedure: MULTIPLE DENTAL RESTORATION/EXTRACTIONS, AVELOPLASTY;  Surgeon: Deborah Wise, DMD;  Location: [facility];  Service: Oral Surgery;  Laterality: N/A;   UPPER GASTROINTESTINAL ENDOSCOPY      Social History Deborah Wise  reports that she has never smoked. She has never used smokeless tobacco. She reports that she does not currently use alcohol after a past usage of about 2.0 standard drinks per week. She reports that she does  not currently use drugs.  family history includes Cancer in her father; Diabetes in her daughter; Diabetes type II in her mother; Hypertension in her mother; Stroke in her mother.  No Known Allergies     PHYSICAL EXAMINATION: Vital signs: Temp 98.1 F (36.7 C)  General: Well-developed, well-nourished, no acute distress HEENT: Sclerae are anicteric, conjunctiva pink. Oral mucosa intact Lungs: Clear Heart: Regular Abdomen: soft, nontender, nondistended, no obvious ascites, no peritoneal signs, normal bowel sounds. No organomegaly. Extremities: No edema Psychiatric: alert and oriented x3. Cooperative     ASSESSMENT:  1.  Cirrhosis with a history of small varices under active surveillance. 2.  History of incidental small gastric carcinoid and hyperplastic polyp   PLAN:  1.  EGD today.

## 2021-02-04 ENCOUNTER — Telehealth: Payer: Self-pay | Admitting: *Deleted

## 2021-02-04 ENCOUNTER — Other Ambulatory Visit: Payer: Self-pay

## 2021-02-04 ENCOUNTER — Ambulatory Visit
Admission: RE | Admit: 2021-02-04 | Discharge: 2021-02-04 | Disposition: A | Discharge status: Home / Self Care | Payer: Medicare Other | Source: Ambulatory Visit | Attending: Nurse Practitioner | Admitting: Nurse Practitioner

## 2021-02-04 DIAGNOSIS — K7460 Unspecified cirrhosis of liver: Secondary | ICD-10-CM | POA: Diagnosis not present

## 2021-02-04 DIAGNOSIS — K7469 Other cirrhosis of liver: Secondary | ICD-10-CM

## 2021-02-04 NOTE — Telephone Encounter (Signed)
  Follow up Call-  Call back number 02/02/2021 11/27/2018  Post procedure Call Back phone  # [phone]  Permission to leave phone message Yes Yes  Some recent data might be hidden   [facility]

## 2021-02-04 NOTE — Telephone Encounter (Signed)
  Follow up Call-  Call back number 02/02/2021 11/27/2018  Post procedure Call Back phone  # [phone]  Permission to leave phone message Yes Yes  Some recent data might be hidden   LMOM to call back with any questions or concerns.  Also, call back if patient has developed fever, respiratory issues or been dx with COVID or had any family members or close contacts diagnosed since her procedure.

## 2021-02-06 ENCOUNTER — Telehealth: Payer: Self-pay | Admitting: Pharmacist

## 2021-02-06 NOTE — Chronic Care Management (AMB) (Signed)
Chronic Care Management Pharmacy Assistant   Name: PENNIE VANBLARCOM  MRN: [id number] DOB: 12-16-1950  Reason for Encounter: Medication Review/ Medication Coordination Call.   Recent office visits:  None.  Recent consult visits:  01/15/21 Roosevelt Locks NP [facility]) - presented to clinic for US Abdomen Limited due to cirrhosis of liver. No medication changes. Schedule follow up upper endoscopy.  Follow up in 6 months.  Hospital visits:  None in previous 6 months  Medications: Outpatient Encounter Medications as of 02/06/2021  Medication Sig Note   Accu-Chek FastClix Lancets MISC 1 each by Does not apply route daily.    Accu-Chek Softclix Lancets lancets USE AS DIRECTED TO CHECK BLOOD SUGAR TWICE A DAY    amLODipine (NORVASC) 10 MG tablet TAKE ONE TABLET BY MOUTH EVERY MORNING FOR BLOOD PRESSURE (Patient taking differently: Take 10 mg by mouth daily.)    ASPIRIN LOW DOSE 81 MG EC tablet TAKE 1 TABLET BY MOUTH EVERY DAY (Patient taking differently: Take 81 mg by mouth daily.)    Blood Glucose Monitoring Suppl (ACCU-CHEK GUIDE) w/Device KIT Please specify directions, refills and quantity    carboxymethylcellulose (REFRESH PLUS) 0.5 % SOLN Place 1 drop into both eyes daily.    Catheters (SELF-CATH SYSTEM STRAIGHT TIP) KIT 1 each by Does not apply route as needed.    chlorhexidine (PERIDEX) 0.12 % solution Use as directed 15 mLs in the mouth or throat 2 (two) times daily.    Cholecalciferol 25 MCG (1000 UT) capsule Take 1,000 Units by mouth daily.     Continuous Blood Gluc Receiver (FREESTYLE LIBRE 2 READER) DEVI 1 each by Does not apply route daily.    Continuous Blood Gluc Sensor (FREESTYLE LIBRE 2 SENSOR) MISC 1 each by Does not apply route daily.    cyanocobalamin 1000 MCG tablet Take 1,000 mcg by mouth daily.     estradiol (ESTRACE) 0.1 MG/GM vaginal cream Place 0.5 g vaginally 2 (two) times a week. Place 0.5g nightly for two weeks then twice a week after  01/14/2021: Per pt uses as needed   ferrous sulfate 325 (65 FE) MG EC tablet Take 1 tablet (325 mg total) by mouth in the morning and at bedtime. (Patient taking differently: Take 325 mg by mouth in the morning and at bedtime.)    glucose blood (ACCU-CHEK GUIDE) test strip 1 each by Other route in the morning and at bedtime. Dx E11.9    hydrochlorothiazide (HYDRODIURIL) 25 MG tablet TAKE ONE TABLET BY MOUTH EVERY MORNING FOR fluid AND FOR BLOOD PRESSURE (Patient taking differently: Take 25 mg by mouth daily.)    lidocaine (XYLOCAINE) 2 % jelly Apply 1 application topically as needed. (Patient taking differently: Apply 1 application topically as needed (pain).)    metFORMIN (GLUCOPHAGE) 500 MG tablet TAKE ONE TABLET BY MOUTH EVERY MORNING FOR diabetes (Patient taking differently: Take 500 mg by mouth daily with breakfast.)    omeprazole (PRILOSEC) 40 MG capsule Take 1 capsule (40 mg total) by mouth daily for 14 days.    REPATHA SURECLICK 209 MG/ML SOAJ INJECT 170m into THE SKIN every 14 DAYS FOR cholesterol (Patient taking differently: Inject 140 mg into the skin every 14 (fourteen) days.)    triamcinolone cream (KENALOG) 0.1 % APPLY TO THE AFFECTED AREA(S) TWICE DAILY FOR SKIN condition (Patient taking differently: Apply 1 application topically 2 (two) times daily as needed (Dry skin).)    Trospium Chloride 60 MG CP24 Take 1 capsule (60 mg total)  by mouth daily.    No facility-administered encounter medications on file as of 02/06/2021.   Fill History: estradiol 0.01% (0.1 mg/gram) vaginal cream 01/13/2021 30   Repatha SureClick 854 mg/mL subcutaneous pen injector 01/13/2021 28   omeprazole 40 mg capsule,delayed release 02/03/2021 14   tamsulosin 0.4 mg capsule 12/10/2020 30   triamcinolone acetonide 0.1 % topical cream 01/13/2021 30   trospium ER 60 mg capsule,extended release 24 hr 12/10/2020 30   amlodipine 10 mg tablet 11/12/2020 30   hydrochlorothiazide 25 mg tablet 11/12/2020 30    metformin 500 mg tablet 11/12/2020 30   Reviewed chart for medication changes ahead of medication coordination call.  No OVs, Consults, or hospital visits since last care coordination call/Pharmacist visit. (If appropriate, list visit date, provider name)  No medication changes indicated OR if recent visit, treatment plan here.  BP Readings from Last 3 Encounters:  02/02/21 (!) 186/87  01/19/21 (!) 236/83  12/29/20 (!) 180/72    Lab Results  Component Value Date   HGBA1C 6.3 08/15/2020     Patient obtains medications through Vials  30 Days   Last adherence delivery included: (medication name and frequency) Repatha Sureclick 627 MG/ML: inject every 14 days Triamcinolone cream (KENALOG) 0.1 %: apply to affected areas twice daily  Patient declined the following medications due to having an extra box and had been skipping some. Patient stated she will not need medications below until next months delivery. Amlodipine 10 mg: one tablet at breakfast Aspirin 81 mg: one tablet at breakfast Vitamin D3 MCG 1000-unit: one capsule at breakfast Vitamin B-12 Daily 1000 MCG: one tablet at breakfast Hydrochlorothiazide 25 mg: one tablet at breakfast Metformin 500 mg: one tablet at breakfast Trospium Chloride 60 mg Oral Daily Tamsulosin HCl 0.4 mg: one capsule at breakfast Ferrous sulfate 325 (65 FE) MG EC tablet: one tablet at breakfast and one tablet at bedtime Estradiol 0.5 g Vaginal 2 times weekly, Place 0.5 g nightly for two weeks then twice a week after  Explanation of abundance on hand is above.  Patient is due for next adherence delivery on: 02/12/21. Called patient and reviewed medications and coordinated delivery.  This delivery to include: Repatha Sureclick 035 MG/ML: inject every 14 days Triamcinolone cream (KENALOG) 0.1 %: apply to affected areas twice daily Amlodipine 10 mg: one tablet at breakfast Aspirin 81 mg: one tablet at breakfast Vitamin D3 MCG 1000-unit: one  capsule at breakfast Vitamin B-12 Daily 1000 MCG: one tablet at breakfast Hydrochlorothiazide 25 mg: one tablet at breakfast Metformin 500 mg: one tablet at breakfast Tamsulosin HCl 0.4 mg: one capsule at breakfast Ferrous sulfate 325 (65 FE) MG EC tablet: one tablet at breakfast and one tablet at bedtime Estradiol 0.5 g Vaginal 2 times weekly, Place 0.5 g nightly for two weeks then twice a week after Accu chek lancets - use to check blood sugar twice daily Accu chek test strips - use to check blood sugar twice daily.   Patient declined the following medications (meds) due to (reason) Trospium Chloride 60 mg Oral Daily  Confirmed delivery date of 02/12/21, advised patient that pharmacy will contact them the morning of delivery.   Care Gaps:  AWV - message sent to Ramond Craver CMA to schedule.  Ophthalmology exam - never done Zoster vaccines [facility]) - never done DEXA SCAN -  never done Urine microalbumin - overdue since 06/23/19 Foot exam - overdue since 10/28//21 Covid-19 vaccine -  booster 4 overdue since 09/26/20 Influenza vaccine - overdue since  01/05/21  Star Rating Drugs:  Metformin 59m - [facility]12/20 [facility]DS [facility] ([phone]

## 2021-02-11 ENCOUNTER — Other Ambulatory Visit: Payer: Self-pay | Admitting: *Deleted

## 2021-02-11 ENCOUNTER — Telehealth: Payer: Self-pay | Admitting: Pharmacist

## 2021-02-11 ENCOUNTER — Encounter: Payer: Self-pay | Admitting: Internal Medicine

## 2021-02-11 DIAGNOSIS — I6523 Occlusion and stenosis of bilateral carotid arteries: Secondary | ICD-10-CM

## 2021-02-11 NOTE — Chronic Care Management (AMB) (Signed)
    Chronic Care Management Pharmacy Assistant   Name: Deborah Wise  MRN: [id number] DOB: 1950/06/22  02/12/21 APPOINTMENT REMINDER   Called [name], No answer, left message of appointment on 02/12/21 at 2pm via telephone visit with Jeni Salles, Pharm D. Notified to have all medications, supplements, blood pressure and/or blood sugar logs available during appointment and to return call if need to reschedule.  Care Gaps:  AWV - message sent to Ramond Craver CMA to schedule Ophthalmology exam - never done Zoster vaccines - never done Dexa scan - never done Urine microalbumin - overdue since 06/23/19 Foot exam - overdue since 04/03/20 Covid-19 vaccine booster 4 - overdue since 09/26/20 Flu vaccine - due  Star Rating Drug:  Metformin '500mg'$  - last filled on 11/12/20 30DS at [facility]  Any gaps in medications fill history? Yes. Patient has an extra box of medication from [facility] and has been skipping some. Patient will not need medications until next months delivery from [facility]. See 02/06/21 monthly call encounter.  [place]  Clinical Pharmacist Assistant [phone]

## 2021-02-12 ENCOUNTER — Other Ambulatory Visit: Payer: Self-pay | Admitting: Internal Medicine

## 2021-02-12 ENCOUNTER — Ambulatory Visit (INDEPENDENT_AMBULATORY_CARE_PROVIDER_SITE_OTHER): Payer: Medicare Other | Admitting: Pharmacist

## 2021-02-12 ENCOUNTER — Other Ambulatory Visit: Payer: Self-pay | Admitting: Obstetrics and Gynecology

## 2021-02-12 DIAGNOSIS — N320 Bladder-neck obstruction: Secondary | ICD-10-CM

## 2021-02-12 DIAGNOSIS — E1169 Type 2 diabetes mellitus with other specified complication: Secondary | ICD-10-CM

## 2021-02-12 DIAGNOSIS — I10 Essential (primary) hypertension: Secondary | ICD-10-CM

## 2021-02-12 NOTE — Progress Notes (Signed)
Chronic Care Management Pharmacy Note  02/12/2021 Name:  Deborah Wise MRN:  [id number] DOB:  05/18/1951  Summary: Pt has not been adherent to medications lately due to busier schedule BP is not at goal < 130/80 per home or office readings   Recommendations/Changes made from today's visit: -Recommended for pt to bring wrist cuff to appt on Monday with vascular to compare readings -Educated on the importance of taking medications daily to prevent heart attacks/strokes -Pt will restart taking medications consistently and her daughter will call for reminders   Plan: Adherence and BP assessment in 2-3 weeks  Subjective: Deborah Wise is an 70 y.o. year old female who is a primary patient of Isaac Bliss, Rayford Halsted, MD.  The CCM team was consulted for assistance with disease management and care coordination needs.    Engaged with patient by telephone for follow up visit in response to provider referral for pharmacy case management and/or care coordination services.   Consent to Services:  The patient was given information about Chronic Care Management services, agreed to services, and gave verbal consent prior to initiation of services.  Please see initial visit note for detailed documentation.   Patient Care Team: Isaac Bliss, Rayford Halsted, MD as PCP - General (Internal Medicine) Viona Gilmore, [facility] as Pharmacist (Pharmacist)  Recent office visits: 08/15/2020 [name], MD Internal Medicine. Presented for pre-operative clearance. Prescribed ferrous sulfate 235 mg: one tablet at breakfast and at bedtime.  07/17/20 [name], MD: Presented for DM follow up. A1c increased to 6.1%. Ordered CGM.  Recent consult visits: 01/22/21 Hetty Ely, RN: Patient presented for EGD.  01/15/21 Roosevelt Locks NP ([place]) - presented to clinic for US Abdomen Limited due to cirrhosis of liver. No medication changes. Schedule  follow up upper endoscopy.  Follow up in 6 months.  12/29/20 Sherlene Shams MD (Obstetrics and Gynecology) - presented to clinic for follow up on bladder outlet obstruction and other issues. No medication changes and follow up in 6 months.  09/29/20 [name] MD( Obstetrics and Gynecology) - seen for urinary urgency. Patient started on  Phenazopyridine HCL 18m three times daily as needed.  09/26/20 MJaquita FoldsMD (Obstetrics and Gynecology) - seen for acute cystitis without hematuria. Patient started on Sulfamethoxazole - Trimethoprim [id number] daily for 3 days. Follow up in 1 week for cystoscopy.  09/03/20 ScJaquita FoldsMD Obstetrics and Gynecology. Prescribed lidocaine HCl 2 % as needed.  08/21/20 McFrann RiderNP Neurology   [facility]: [facility]  Medication Reconciliation was completed by comparing discharge summary, patient's EMR and Pharmacy list, and upon discussion with patient.   Admitted to the hospital on 10/20/2020 due to Multiple Dental extractions . Discharge date was 05.16.2022. [facility] -started the following medication: Amoxicillin 500 mg oral 3 times a day Oxycodone 5 mg ( medication held until 05.25.2022)   Medication Changes at Hospital Discharge: -Changed None   Medications Discontinued at Hospital Discharge: -Stopped  Meloxicam 15 mg Phenazopyridine 95 mg      Medications that remain the same after Hospital Discharge:??  -All other medications will remain the same.   Objective:  Lab Results  Component Value Date   CREATININE 0.76 01/19/2021   BUN 13 01/19/2021   GFR 85.84 08/15/2020   GFRNONAA >60 01/19/2021   GFRAA 84  03/18/2020   NA 138 01/19/2021   K 4.8 01/19/2021   CALCIUM 9.6 01/19/2021   CO2 24 01/19/2021   GLUCOSE 124 (H) 01/19/2021    Lab  Results  Component Value Date/Time   HGBA1C 6.3 08/15/2020 08:12 AM   HGBA1C 6.1 (A) 07/17/2020 02:19 PM   HGBA1C 5.5 02/20/2020 08:22 AM   GFR 85.84 08/15/2020 08:12 AM   GFR 86.68 09/07/2018 01:21 PM   MICROALBUR 52.8 (H) 06/22/2018 09:01 AM    Last diabetic Eye exam: No results found for: HMDIABEYEEXA  Last diabetic Foot exam: No results found for: HMDIABFOOTEX   Lab Results  Component Value Date   CHOL 147 08/15/2020   HDL 61.60 08/15/2020   LDLCALC 64 08/15/2020   TRIG 108.0 08/15/2020   CHOLHDL 2 08/15/2020    Hepatic Function Latest Ref Rng & Units 01/19/2021 08/15/2020 02/14/2019  Total Protein 6.5 - 8.1 g/dL 7.9 7.7 7.1  Albumin 3.5 - 5.0 g/dL 4.2 4.2 4.4  AST 15 - 41 U/L 143(H) 37 38  ALT 0 - 44 U/L 103(H) 28 32  Alk Phosphatase 38 - 126 U/L 85 79 100  Total Bilirubin 0.3 - 1.2 mg/dL 1.3(H) 1.1 1.1  Bilirubin, Direct 0.00 - 0.40 mg/dL - - 0.53(H)    Lab Results  Component Value Date/Time   TSH 3.314 06/28/2016 07:24 AM   TSH 2.038 07/21/2013 07:20 AM    CBC Latest Ref Rng & Units 01/19/2021 10/20/2020 08/15/2020  WBC 4.0 - 10.5 K/uL 4.8 7.2 6.6  Hemoglobin 12.0 - 15.0 g/dL 12.4 11.9(L) 11.5(L)  Hematocrit 36.0 - 46.0 % 38.8 37.9 35.6(L)  Platelets 150 - 400 K/uL 133(L) 129(L) 139.0(L)    No results found for: VD25OH  Clinical ASCVD: Yes  The ASCVD Risk score (Arnett DK, et al., 2019) failed to calculate for the following reasons:   The patient has a prior MI or stroke diagnosis    Depression screen [facility] 2/9 01/03/2020 12/22/2018 08/03/2018  Decreased Interest 0 0 0  Down, Depressed, Hopeless 1 1 0  PHQ - 2 Score 1 1 0  Altered sleeping 1 2 1   Tired, decreased energy 1 2 0  Change in appetite 1 2 0  Feeling bad or failure about yourself  0 0 0  Trouble concentrating - 0 0  Moving slowly or fidgety/restless 0 0 0  Suicidal thoughts 0 0 0  PHQ-9 Score 4 7 1   Difficult doing work/chores Not difficult at all Not difficult at all Not difficult at all  Some  recent data might be hidden      Social History   Tobacco Use  Smoking Status Never  Smokeless Tobacco Never   BP Readings from Last 3 Encounters:  02/02/21 (!) 186/87  01/19/21 (!) 236/83  12/29/20 (!) 180/72   Pulse Readings from Last 3 Encounters:  02/02/21 84  01/19/21 72  12/29/20 79   Wt Readings from Last 3 Encounters:  01/22/21 167 lb (75.8 kg)  01/19/21 167 lb 11.2 oz (76.1 kg)  10/20/20 172 lb (78 kg)   BMI Readings from Last 3 Encounters:  01/22/21 28.67 kg/m  01/19/21 28.79 kg/m  10/20/20 29.52 kg/m    Assessment/Interventions: Review of patient past medical history, allergies, medications, health status, including review of consultants reports, laboratory and other test data, was performed as part of comprehensive evaluation and provision of chronic care management services.   SDOH:  (Social Determinants of Health) assessments and interventions performed: No  SDOH Screenings   Alcohol  Screen: Not on file  Depression (PHQ2-9): Not on file  Financial Resource Strain: Not on file  Food Insecurity: Not on file  Housing: Not on file  Physical Activity: Not on file  Social Connections: Not on file  Stress: Not on file  Tobacco Use: Low Risk    Smoking Tobacco Use: Never   Smokeless Tobacco Use: Never  Transportation Needs: Not on file    [place]  No Known Allergies  Medications Reviewed Today     Reviewed by Monday, Joshua, CRNA (Certified Registered Nurse Anesthetist) on 02/02/21 at [phone]  Med List Status: <None>   Medication Order Taking? Sig Documenting Provider Last Dose Status Informant  0.9 %  sodium chloride infusion [id number]   Irene Shipper, MD  Active   Accu-Chek FastClix Lancets MISC [id number] Yes 1 each by Does not apply route daily. Isaac Bliss, Rayford Halsted, MD Past Week Active Self  Accu-Chek Softclix Lancets lancets [id number] Yes USE AS DIRECTED TO CHECK BLOOD SUGAR TWICE A DAY Isaac Bliss, Rayford Halsted, MD Past Week  Active Self  amLODipine (NORVASC) 10 MG tablet [id number] Yes TAKE ONE TABLET BY MOUTH EVERY MORNING FOR BLOOD PRESSURE  Patient taking differently: Take 10 mg by mouth daily.   Isaac Bliss, Rayford Halsted, MD Past Week Active Self  ASPIRIN LOW DOSE 81 MG EC tablet [id number] Yes TAKE 1 TABLET BY MOUTH EVERY DAY  Patient taking differently: Take 81 mg by mouth daily.   Isaac Bliss, Rayford Halsted, MD Past Week Active   Blood Glucose Monitoring Suppl (ACCU-CHEK GUIDE) w/Device Drucie Opitz [id number] Yes Please specify directions, refills and quantity Isaac Bliss, Rayford Halsted, MD Past Week Active Self  carboxymethylcellulose (REFRESH PLUS) 0.5 % SOLN [id number] Yes Place 1 drop into both eyes daily. [provider] 02/02/2021 Active Self  Catheters (SELF-CATH SYSTEM STRAIGHT TIP) KIT [id number] Yes 1 each by Does not apply route as needed. [name], MD 02/02/2021 Active Self  chlorhexidine (PERIDEX) 0.12 % solution [id number] Yes Use as directed 15 mLs in the mouth or throat 2 (two) times daily. [provider] 02/01/2021 Active Self  Cholecalciferol 25 MCG (1000 UT) capsule [id number] Yes Take 1,000 Units by mouth daily.  [provider] 02/01/2021 Active Self  Continuous Blood Gluc Receiver (FREESTYLE LIBRE 2 READER) DEVI [id number] Yes 1 each by Does not apply route daily. Isaac Bliss, Rayford Halsted, MD Past Week Active Self  Continuous Blood Gluc Sensor (FREESTYLE LIBRE 2 SENSOR) [place] [id number] Yes 1 each by Does not apply route daily. Isaac Bliss, Rayford Halsted, MD Past Week Active Self  cyanocobalamin 1000 MCG tablet [id number] Yes Take 1,000 mcg by mouth daily.  [provider] Past Week Active Self  estradiol (ESTRACE) 0.1 MG/GM vaginal cream [id number] No Place 0.5 g vaginally 2 (two) times a week. Place 0.5g nightly for two weeks then twice a week after [name], MD Unknown Active Self           Med Note Linus Mako, DENISE P   Wed Jan 14, 2021  4:52 PM)  Per pt uses as needed  ferrous sulfate 325 (65 FE) MG EC tablet [id number] Yes Take 1 tablet (325 mg total) by mouth in the morning and at bedtime.  Patient taking differently: Take 325 mg by mouth in the morning and at bedtime.   Isaac Bliss, Rayford Halsted, MD Past Week Active   glucose blood (ACCU-CHEK GUIDE) test strip [id number] Yes 1 each by Other route in the morning and at  bedtime. Dx E11.9 Isaac Bliss, Rayford Halsted, MD Past Week Active Self  hydrochlorothiazide (HYDRODIURIL) 25 MG tablet [id number] Yes TAKE ONE TABLET BY MOUTH EVERY MORNING FOR fluid AND FOR BLOOD PRESSURE  Patient taking differently: Take 25 mg by mouth daily.   Isaac Bliss, Rayford Halsted, MD Past Week Active Self  lidocaine (XYLOCAINE) 2 % jelly [id number] No Apply 1 application topically as needed.  Patient taking differently: Apply 1 application topically as needed (pain).   [name], MD Unknown Active   metFORMIN (GLUCOPHAGE) 500 MG tablet [id number] Yes TAKE ONE TABLET BY MOUTH EVERY MORNING FOR diabetes  Patient taking differently: Take 500 mg by mouth daily with breakfast.   Isaac Bliss, Rayford Halsted, MD Past Week Active Self  REPATHA SURECLICK 387 MG/ML Darden Palmer [id number] No INJECT 150m into THE SKIN every 14 DAYS FOR cholesterol  Patient taking differently: Inject 140 mg into the skin every 14 (fourteen) days.   MFrann Rider NP Unknown Active   triamcinolone cream (KENALOG) 0.1 % [id number] APPLY TO THE AFFECTED AREA(S) TWICE DAILY FOR SKIN condition  Patient taking differently: Apply 1 application topically 2 (two) times daily as needed (Dry skin).   HIsaac Bliss ERayford Halsted MD Unknown Active   Trospium Chloride 60 MG CP24 [id number] Take 1 capsule (60 mg total) by mouth daily. SJaquita Folds MD Unknown Active             Patient Active Problem List   Diagnosis Date Noted   Chronic hepatitis C ([place] 07/02/2020   Personal history of colonic polyps 07/02/2020   Fracture of  metacarpal bone 05/15/2020   Peripheral arterial disease ([place] 04/15/2020   Coronary artery disease    Chronic diastolic CHF (congestive heart failure) ([place] 03/12/2020   Dysuria 03/01/2020   Chronic arthropathy 11/07/2019   Hallux limitus of right foot 11/07/2019   Diabetic neuropathy ([place] 04/04/2019   Cirrhosis of liver (HC[place] 06/22/2018   Chronic hepatitis ([place] 06/22/2018   Hyperlipidemia associated with type 2 diabetes mellitus ([place] 06/22/2018   Carotid artery disease (HR[place] 06/22/2018   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H[place] 07/02/2016   Aneurysm of middle cerebral artery    Cerebrovascular accident (CVA) due to thrombosis of left vertebral artery (HBoy[place] 06/28/2016   Cocaine abuse (HP[place]    Stroke (cerebrum) ([place] 06/27/2016   Microcytic anemia 12/27/2014   Thrombocytopenia (HMart[place] 12/25/2014   Hypoglycemia 07/21/2013   Diabetes mellitus (HW[place] 07/21/2013   Malignant hypertension 07/21/2013   Liver disease 07/21/2013   Alcohol abuse 07/21/2013    Immunization History  Administered Date(s) Administered   Influenza, High Dose Seasonal PF 06/22/2018   Influenza,inj,Quad PF,6+ Mos 06/29/2016   Moderna SARS-COV2 Booster Vaccination 05/28/2020   PFIZER(Purple Top)SARS-COV-2 Vaccination 09/03/2019, 10/04/2019   Td 08/03/2017   Patient hasn't been taking her medications consistently and she just restarted taking her medications more consistently now. Patient's daughter is going to call her to remind her to take her medications. Patient just restarted consistently taking her medications last week.  Conditions to be addressed/monitored:  Hypertension, Hyperlipidemia, Diabetes, Overactive Bladder and Cirrhosis, Neuropathy  Conditions addressed this visit: Hypertension, hyperlipidemia  Care Plan : CCM Pharmacy Care Plan  Updates made by PViona Gilmore RMoorlandsince 02/12/2021 12:00 AM     Problem: Problem: Hypertension, Hyperlipidemia, Diabetes, Overactive Bladder and Cirrhosis,  Neuropathy      Long-Range Goal: Patient-Specific Goal   Start Date: 10/17/2020  Expected End Date: 10/17/2021  Recent Progress: On track  Priority: High  Note:   Current Barriers:  Unable to independently monitor therapeutic efficacy Unable to achieve control of blood pressure   Pharmacist Clinical Goal(s):  Patient will achieve adherence to monitoring guidelines and medication adherence to achieve therapeutic efficacy achieve control of blood pressure as evidenced by home readings  through collaboration with PharmD and provider.   Interventions: 1:1 collaboration with Isaac Bliss, Rayford Halsted, MD regarding development and update of comprehensive plan of care as evidenced by provider attestation and co-signature Inter-disciplinary care team collaboration (see longitudinal plan of care) Comprehensive medication review performed; medication list updated in electronic medical record  Hypertension (BP goal <130/80) -Uncontrolled -Current treatment: Amlodipine 10 mg daily  HCTZ 25 mg daily  Lisinopril 20 mg daily  Hydralazine 25 mg daily  -Medications previously tried: n/a  -Current home readings: 140s/90s -Current dietary habits: pays attention to sodium content on package labels; uses garlic powder and onion powder and Mrs. Dash more than salt -Current exercise habits: more active lately on the treadmill and walking in stores -Denies hypotensive/hypertensive symptoms -Educated on Daily salt intake goal < 2300 mg; Exercise goal of 150 minutes per week; Importance of home blood pressure monitoring; Proper BP monitoring technique; -Counseled to monitor BP at home weekly, document, and provide log at future appointments -Counseled on diet and exercise extensively Recommended to continue current medication  Hyperlipidemia/history of stroke: (LDL goal < 70) -Controlled -Current treatment: Aspirin 81 mg daily  Repatha 140 mg every 14 days - get its free (uses a calendar)   -Medications previously tried: atorvastatin (cirrhosis)  -Current dietary patterns: did not discuss -Current exercise habits: more active lately -Educated on Cholesterol goals;  Benefits of statin for ASCVD risk reduction; Importance of limiting foods high in cholesterol; -Counseled on diet and exercise extensively Recommended to continue current medication -Counseled on the importance of taking her medications consistently to prevent furture strokes/heart attacks  Diabetes (A1c goal <7%) -Controlled -Current medications: Metformin 500 mg 1 tablet daily  -Medications previously tried: none  -Current home glucose readings fasting glucose: using CGM - unsure of current readings post prandial glucose: n/a -Denies hypoglycemic/hyperglycemic symptoms -Current meal patterns:  breakfast: did not discuss lunch: did not discuss  dinner: did not discuss snacks: did not discuss drinks: did not discuss -Current exercise: did not discuss -Educated on A1c and blood sugar goals; Benefits of routine self-monitoring of blood sugar; Continuous glucose monitoring; Carbohydrate counting and/or plate method -Counseled to check feet daily and get yearly eye exams -Counseled on diet and exercise extensively Recommended to continue current medication  Chronic pain (Goal: minimize symptoms of pain) -Not ideally controlled -Current treatment  Meloxicam 7.5 mg daily PRN (1-2x weekly) Meloxicam 15 mg daily PRN (Not taking) Oxycodone 4 mg PRN (Not taking)  -Medications previously tried: n/a  -Counseled on  avoiding taking meloxicam doses together and to take with plenty of water and with a snack/meal due to DDI with aspirin.  Overactive bladder (Goal: minimize symptoms) -Not ideally controlled -Current treatment  Trospium daily Tamsulosin 0.4 mg daily -Medications previously tried: none  -Recommended to continue current medication   Health Maintenance -Vaccine gaps: pneumonia, shingles,  COVID booster -Current therapy:  Vitamin D 2000 units daily  Vitamin B12 1000 mcg daily  -Educated on Cost vs benefit of each product must be carefully weighed by individual consumer -Patient is satisfied with current therapy and denies issues -Recommended to continue current medication  Patient Goals/Self-Care Activities Patient will:  - take medications as prescribed focus on medication adherence by  setting an alarm check glucose a few times a week, document, and provide at future appointments check blood pressure a few times a week, document, and provide at future appointments target a minimum of 150 minutes of moderate intensity exercise weekly  Follow Up Plan: Telephone follow up appointment with care management team member scheduled for: 4 months        Medication Assistance: None required.  Patient affirms current coverage meets needs.  Compliance/Adherence/Medication fill history: Care Gaps: COVID booster, influenza vaccine, eye exam, shingrix, DEXA, foot exam, urine microalbumin   Star-Rating Drugs: Metformin 538m - last filled on 11/12/20 30DS at [facility]  [place]'s [facility] [place]:  [place] [address] [place] [place]Phone: [phone]: [phone] [place]- [address]Phone: [phone]: [phone] Uses pill box? No - adherence packaging Pt endorses 50% compliance  We discussed: Benefits of medication synchronization, packaging and delivery as well as enhanced pharmacist oversight with [facility]. Patient decided to: Utilize [facility] pharmacy for medication synchronization, packaging and delivery  Care Plan and Follow Up Patient Decision:  Patient agrees to Care Plan and Follow-up.  Plan: Telephone follow up appointment with care management team member  scheduled for:  4 months  MJeni Salles PharmD BHoldenPharmacist LNewburgat [phone]

## 2021-02-16 ENCOUNTER — Other Ambulatory Visit: Payer: Self-pay

## 2021-02-16 ENCOUNTER — Encounter: Payer: Self-pay | Admitting: Physician Assistant

## 2021-02-16 ENCOUNTER — Ambulatory Visit (INDEPENDENT_AMBULATORY_CARE_PROVIDER_SITE_OTHER): Payer: Medicare Other | Admitting: Physician Assistant

## 2021-02-16 ENCOUNTER — Ambulatory Visit (HOSPITAL_COMMUNITY)
Admission: RE | Admit: 2021-02-16 | Discharge: 2021-02-16 | Disposition: A | Discharge status: Home / Self Care | Payer: Medicare Other | Source: Ambulatory Visit | Attending: Surgery | Admitting: Surgery

## 2021-02-16 VITALS — BP 177/88 | HR 100 | Temp 97.6°F | Resp 16 | Ht 64.0 in | Wt 167.0 lb

## 2021-02-16 DIAGNOSIS — I6523 Occlusion and stenosis of bilateral carotid arteries: Secondary | ICD-10-CM | POA: Insufficient documentation

## 2021-02-16 NOTE — Progress Notes (Signed)
History of Present Illness:  Patient is a 70 y.o. year old female who presents for evaluation of carotid stenosis.  She has had a history of a left brain stroke based on MRI.  Surgical history also significant for cerebral angiogram in January 2018 demonstrating 60 to 65% right ICA stenosis with no intervention.   She denies any strokelike symptoms including slurring speech, changes in vision, or one-sided weakness.  She is taking an aspirin daily.  She does not take a statin her primary MD is following Lipid studies.    Past Medical History:  Diagnosis Date   Bilateral carotid artery stenosis    vascular-- Dagoberto Ligas PA --- last duplex in epic 10-06-2020 right ICA 60-79% and left ICA 40-59%   Bladder outlet obstruction    self caths   Bladder wall thickening    Blood transfusion without reported diagnosis    Coronary artery disease    cardiologist--- dr Gwenlyn Found---- cath 03-25-2020 showed proxRCA 75%, distal RCA 50%, and midLAD 60%,  managed medically   Detrusor overactivity    urinary bladder   Esophageal varices ([place])    followed by dr Henrene Pastor---  nonbleeding   Fibroid, uterine    Full dentures    Hepatic cirrhosis due to chronic hepatitis C infection ([place])    followed by [place] NP---  s/p treated chronic hep c with cure and complicated by portal hypertension and esophageal varies   History of CVA (cerebrovascular accident) without residual deficits 06/2016   neurologist--- Frann Rider---  hospital admission 06-27-2016 right [place] infart with  left MCA aneurysm,  no residual per pt   History of hepatitis C    post treatement and cured   Hyperlipidemia    Hypertension    IDA (iron deficiency anemia)    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 ;  followed by neurologist   Peripheral vascular disease ([place])    Self-catheterizes urinary bladder    Stroke ([place]) 2018   Thrombocytopenia ([place]) 12/25/2014   Type 2 diabetes mellitus ([place])     followed by pcp---- (01-14-2021 pt stated check blood sugar at home twice weekly fasting, did not know average)   Vaginal atrophy     Past Surgical History:  Procedure Laterality Date   CESAREAN SECTION  1973   WITH BILATERAL TUBAL LIGATION   COLONOSCOPY  2017   PER PT SCHEDULED FOR NEXT ONE 02-02-2021   CYSTOSCOPY N/A 01/19/2021   Procedure: CYSTOSCOPY;  Surgeon: [name], MD;  Location: [facility];  Service: Gynecology;  Laterality: N/A;   ESOPHAGOGASTRODUODENOSCOPY  11/27/2018   LAST ONE BY DR PERRY   IR GENERIC HISTORICAL  06/30/2016   IR ANGIO VERTEBRAL SEL SUBCLAVIAN INNOMINATE UNI R MOD SED 06/30/2016 [name], MD [facility]   IR GENERIC HISTORICAL  06/30/2016   IR ANGIO VERTEBRAL SEL VERTEBRAL UNI L MOD SED 06/30/2016 [name], MD [facility]   IR GENERIC HISTORICAL  06/30/2016   IR ANGIO INTRA EXTRACRAN SEL COM CAROTID INNOMINATE BILAT MOD SED 06/30/2016 [name], MD [facility]   LEFT HEART CATH AND CORONARY ANGIOGRAPHY N/A 03/25/2020   Procedure: LEFT HEART CATH AND CORONARY ANGIOGRAPHY;  Surgeon: Jettie Booze, MD;  Location: [facility];  Service: Cardiovascular;  Laterality: N/A;   MULTIPLE EXTRACTIONS WITH ALVEOLOPLASTY  10/25/2011   Procedure: MULTIPLE EXTRACION WITH ALVEOLOPLASTY;  Surgeon: Gae Bon, DDS;  Location: [facility] [facility];  Service: Oral Surgery;  Laterality: Bilateral;  Extract teeth numbers one, two, six, seven, eight, nine, ten, eleven, twelve, fifteen, sixteen, eighteen, nineteen, twenty-three, twenty-four, twenty-five, twenty-seven, thirty-two and alveoplasty.   ORIF ANKLE FRACTURE Right 11/25/2007   TOOTH EXTRACTION N/A 10/20/2020   Procedure: MULTIPLE DENTAL RESTORATION/EXTRACTIONS, AVELOPLASTY;  Surgeon: Diona Browner, DMD;  Location: [facility];  Service: Oral Surgery;  Laterality: N/A;   UPPER GASTROINTESTINAL ENDOSCOPY       Social History Social History   Tobacco Use   Smoking  status: Never   Smokeless tobacco: Never  Vaping Use   Vaping Use: Never used  Substance Use Topics   Alcohol use: Not Currently    Alcohol/week: 2.0 standard drinks    Types: 2 Cans of beer per week    Comment: 01-14-2021 per pt on the weekend, two beers   Drug use: Not Currently    Comment: 01-14-2021 last use cocaine last year 2018    Family History Family History  Problem Relation Age of Onset   Diabetes type II Mother    Hypertension Mother    Stroke Mother    Cancer Father        patient does not know what type of cancer   Diabetes Daughter    Breast cancer Neg Hx    Colon polyps Neg Hx    Crohn's disease Neg Hx    Esophageal cancer Neg Hx    Rectal cancer Neg Hx    Stomach cancer Neg Hx    Colon cancer Neg Hx     Allergies  No Known Allergies   Current Outpatient Medications  Medication Sig Dispense Refill   Accu-Chek FastClix Lancets MISC 1 each by Does not apply route daily. 100 each 3   ACCU-CHEK GUIDE test strip USE TO test AT BREAKFAST AND AT BEDTIME 100 strip 0   Accu-Chek Softclix Lancets lancets USE AS DIRECTED TO CHECK BLOOD SUGAR TWICE A DAY 100 each 0   amLODipine (NORVASC) 10 MG tablet TAKE ONE TABLET BY MOUTH EVERY MORNING FOR BLOOD PRESSURE (Patient taking differently: Take 10 mg by mouth daily.) 90 tablet 1   ASPIRIN LOW DOSE 81 MG EC tablet TAKE 1 TABLET BY MOUTH EVERY DAY (Patient taking differently: Take 81 mg by mouth daily.) 90 tablet 1   Blood Glucose Monitoring Suppl (ACCU-CHEK GUIDE) w/Device KIT Please specify directions, refills and quantity 1 kit 0   carboxymethylcellulose (REFRESH PLUS) 0.5 % SOLN Place 1 drop into both eyes daily.     Catheters (SELF-CATH SYSTEM STRAIGHT TIP) KIT 1 each by Does not apply route as needed. 120 kit 11   chlorhexidine (PERIDEX) 0.12 % solution Use as directed 15 mLs in the mouth or throat 2 (two) times daily.     Cholecalciferol 25 MCG (1000 UT) capsule Take 1,000 Units by mouth daily.      Continuous  Blood Gluc Receiver (FREESTYLE LIBRE 2 READER) DEVI 1 each by Does not apply route daily. 2 each 11   Continuous Blood Gluc Sensor (FREESTYLE LIBRE 2 SENSOR) MISC 1 each by Does not apply route daily. 2 each 11   cyanocobalamin 1000 MCG tablet Take 1,000 mcg by mouth daily.      estradiol (ESTRACE) 0.1 MG/GM vaginal cream Place 0.5 g vaginally 2 (two) times a week. Place 0.5g nightly for two weeks then twice a week after 30 g 11   ferrous sulfate 325 (65 FE) MG EC tablet Take 1 tablet (325 mg total) by mouth in the morning and at bedtime. (Patient  taking differently: Take 325 mg by mouth in the morning and at bedtime.)  3   hydrochlorothiazide (HYDRODIURIL) 25 MG tablet TAKE ONE TABLET BY MOUTH EVERY MORNING FOR fluid AND FOR BLOOD PRESSURE (Patient taking differently: Take 25 mg by mouth daily.) 90 tablet 1   lidocaine (XYLOCAINE) 2 % jelly Apply 1 application topically as needed. (Patient taking differently: Apply 1 application topically as needed (pain).) 30 mL 5   metFORMIN (GLUCOPHAGE) 500 MG tablet TAKE ONE TABLET BY MOUTH EVERY MORNING FOR diabetes (Patient taking differently: Take 500 mg by mouth daily with breakfast.) 180 tablet 1   omeprazole (PRILOSEC) 40 MG capsule Take 1 capsule (40 mg total) by mouth daily for 14 days. 14 capsule 0   REPATHA SURECLICK 161 MG/ML SOAJ INJECT 144m into THE SKIN every 14 DAYS FOR cholesterol (Patient taking differently: Inject 140 mg into the skin every 14 (fourteen) days.) 2 mL 11   triamcinolone cream (KENALOG) 0.1 % APPLY TO THE AFFECTED AREA(S) TWICE DAILY FOR SKIN condition (Patient taking differently: Apply 1 application topically 2 (two) times daily as needed (Dry skin).) 80 g 4   Trospium Chloride 60 MG CP24 Take 1 capsule (60 mg total) by mouth daily. 30 capsule 5   No current facility-administered medications for this visit.    ROS:   General:  No weight loss, Fever, chills  HEENT: No recent headaches, no nasal bleeding, no visual changes, no  sore throat  Neurologic: No dizziness, blackouts, seizures. No recent symptoms of stroke or mini- stroke. No recent episodes of slurred speech, or temporary blindness.  Cardiac: No recent episodes of chest pain/pressure, no shortness of breath at rest.  No shortness of breath with exertion.  Denies history of atrial fibrillation or irregular heartbeat  Vascular: No history of rest pain in feet.  No history of claudication.  No history of non-healing ulcer, No history of DVT   Pulmonary: No home oxygen, no productive cough, no hemoptysis,  No asthma or wheezing  Musculoskeletal:  [ ]  Arthritis, [ ]  Low back pain,  [ ]  Joint pain  Hematologic:No history of hypercoagulable state.  No history of easy bleeding.  No history of anemia  Gastrointestinal: No hematochezia or melena,  No gastroesophageal reflux, no trouble swallowing  Urinary: [ ]  chronic Kidney disease, [ ]  on HD - [ ]  MWF or [ ]  TTHS, [ ]  Burning with urination, [ ]  Frequent urination, [ ]  Difficulty urinating;   Skin: No rashes  Psychological: No history of anxiety,  No history of depression   Physical Examination  Vitals:   02/16/21 0903 02/16/21 0909  BP: (!) 175/80 (!) 177/88  Pulse: 85 100  Resp: 16   Temp: 97.6 F (36.4 C)   TempSrc: Temporal   SpO2: 100%   Weight: 167 lb (75.8 kg)   Height: 5' 4"  (1.626 m)     Body mass index is 28.67 kg/m.  General:  Alert and oriented, no acute distress HEENT: Normal Neck: No bruit or JVD Pulmonary: Clear to auscultation bilaterally Cardiac: Regular Rate and Rhythm without murmur Gastrointestinal: Soft, non-tender, non-distended, no mass, no scars Skin: No rash Extremity Pulses:  2+ radial, brachial, femoral, dorsalis pedis,  pulses bilaterally Musculoskeletal: No deformity or edema  Neurologic: Upper and lower extremity motor 5/5 and symmetric  DATA:     Right Carotid Findings:  +----------+-------+--------+--------+-----------------+-------------------  ----+             PSV    EDV cm/sStenosisPlaque  Comments                            cm/s                   Description                                +----------+-------+--------+--------+-----------------+-------------------  ----+  CCA Prox  69     8                                                          +----------+-------+--------+--------+-----------------+-------------------  ----+  CCA Mid   47     10                                                         +----------+-------+--------+--------+-----------------+-------------------  ----+  CCA Distal27     10              heterogenous                               +----------+-------+--------+--------+-----------------+-------------------  ----+  ICA Prox  177    43      40-59%  calcific                                   +----------+-------+--------+--------+-----------------+-------------------  ----+  ICA Mid   161    20                               post stenotic                                                                turbulence                +----------+-------+--------+--------+-----------------+-------------------  ----+  ICA Distal82     22                                                         +----------+-------+--------+--------+-----------------+-------------------  ----+  ECA       117    0               calcific                                   +----------+-------+--------+--------+-----------------+-------------------  ----+   +----------+--------+-------+----------------+-------------------+  PSV cm/sEDV cmsDescribe        Arm Pressure (mmHG)  +----------+--------+-------+----------------+-------------------+  [id number]            Multiphasic, WNL                     +----------+--------+-------+----------------+-------------------+   +---------+--------+--+--------+--+---------+   VertebralPSV cm/s64EDV cm/s11Antegrade  +---------+--------+--+--------+--+---------+       Left Carotid Findings:  +----------+--------+--------+--------+------------------+--------+            PSV cm/sEDV cm/sStenosisPlaque DescriptionComments  +----------+--------+--------+--------+------------------+--------+  CCA Prox  124     10                                          +----------+--------+--------+--------+------------------+--------+  CCA Mid   121     20              calcific                    +----------+--------+--------+--------+------------------+--------+  CCA Distal97      19              heterogenous                +----------+--------+--------+--------+------------------+--------+  ICA Prox  102     21              calcific                    +----------+--------+--------+--------+------------------+--------+  ICA Mid   109     31                                          +----------+--------+--------+--------+------------------+--------+  ICA Distal128     28                                          +----------+--------+--------+--------+------------------+--------+  ECA       201     20      >50%    calcific                    +----------+--------+--------+--------+------------------+--------+   +----------+--------+--------+----------------+-------------------+            PSV cm/sEDV cm/sDescribe        Arm Pressure (mmHG)  +----------+--------+--------+----------------+-------------------+  [id number]             Multiphasic, WNL                     +----------+--------+--------+----------------+-------------------+   +---------+--------+--+--------+--+---------+  VertebralPSV cm/s84EDV cm/s20Antegrade  +---------+--------+--+--------+--+---------+     Summary:  Right Carotid: Velocities in the right ICA are consistent with a 40-59%                 stenosis. Shadowing from  calcified plaque obsures higher                 velocities.   Left Carotid: Velocities in the left ICA are consistent with a 1-39%  stenosis.                Shadowing from calcified plaque obsures higher velocities.  The ECA appears >50% stenosed.   Vertebrals:  Bilateral vertebral arteries demonstrate antegrade flow.  Subclavians: Normal flow hemodynamics were seen in bilateral subclavian               arteries.   ASSESSMENT/PLAN: Asymptomatic carotid stenosis She remains asymptomatic.    Right Carotid: Velocities in the right ICA are consistent with a 40-59%                 stenosis. Shadowing from calcified plaque obsures higher                 velocities.   Left Carotid: Velocities in the left ICA are consistent with a 1-39%  stenosis.                Shadowing from calcified plaque obsures higher velocities.   She will continue to exercise and maintain a healthy weight.  Continue ASA daily and keep a close eye on her DM.  She will f/u in 1 year for repeat carotid duplex.  If she has symptoms of stroke/TIA  She will call 911.    Roxy Horseman PA-C [facility] Office: [phone]  MD in clinic [place]

## 2021-02-26 ENCOUNTER — Encounter: Payer: Self-pay | Admitting: Adult Health

## 2021-02-26 ENCOUNTER — Ambulatory Visit (INDEPENDENT_AMBULATORY_CARE_PROVIDER_SITE_OTHER): Payer: Medicare Other | Admitting: Adult Health

## 2021-02-26 VITALS — BP 170/82 | HR 88 | Ht 64.0 in | Wt 168.2 lb

## 2021-02-26 DIAGNOSIS — G5731 Lesion of lateral popliteal nerve, right lower limb: Secondary | ICD-10-CM | POA: Diagnosis not present

## 2021-02-26 DIAGNOSIS — Z8673 Personal history of transient ischemic attack (TIA), and cerebral infarction without residual deficits: Secondary | ICD-10-CM | POA: Diagnosis not present

## 2021-02-26 NOTE — Progress Notes (Signed)
[facility] [address] [place]. [place] [address] [phone]       OFFICE FOLLOW UP NOTE  Ms. PHOENICIA PIRIE Date of Birth:  1951/01/29 Medical Record Number:  [id number]   Referring MD: Dr. Jerilee Hoh  Reason for Referral: Falls and balance issues  Chief complaint: Chief Complaint  Patient presents with   Follow-up    Rm 2       HPI:   Today, 02/26/2021, Ms. Divelbiss returns for 48-monthfollow-up.  Overall stable from neurological standpoint.  No stroke/TIA symptoms since prior visit.  Continues on aspirin and Repatha without side effects.  Blood pressure today elevated routinely monitors at home and typically stable.  Peroneal neuropathy stable.  Denies any recent falls.  Continues to maintain ADLs and IADLs independently.  Reports routine lab work by PCP which has been satisfactory.  No concerns at this time.     History provided for reference purposes only Update 08/21/2020 JM: Ms. GHirtreturns for 6 months scheduled follow-up with prior stroke history and right peroneal neuropathy  Hx of Stroke:  Stable without new stroke/TIA symptoms Compliant on aspirin 81 mg daily and Repatha -denies side effects Blood pressure today 178/86 - blood pressure has been elevated more recently per patient but recent OV with PCP BP 135/80 -she plans on scheduling follow-up with cardiology Recent lab work: LDL 64, A1c 6.1  Peroneal neuropathy: No recent falls Relatively stable since prior visit  reports having a test tomorrow "to look at flow in my legs" - reports symptoms consistent with claudication  Also following with urology for urinary retention - she has been having to straight cath -personally reviewed urology OV notes and 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 GSabanreturns for 691-monthollow-up  Right peroneal neuropathy has been stable without worsening.  She does report a couple  falls since prior visit with a recent fall last week with patient reporting " my whole body went weak and I wasn't able to get myself up without help".  PCP is currently working her up for cardiac condition  History of stroke without new stroke/TIA symptoms remaining on aspirin and Repatha for secondary stroke prevention without side effects.  She apparently self discontinued atorvastatin over 1 month ago due to causing nausea.  Blood pressure today 140/88.  Glucose levels not routinely monitored at home.  No further concerns at this time  Update 08/08/2019 JM: Ms. GlWessmans a [age]ear old female who is being seen today, 08/08/2019, for routine follow-up. 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  She has not had any additional falls.  She has been stable from a stroke standpoint with continuation of aspirin 81 mg daily, atorvastatin 80 mg daily and Repatha without side effects for secondary stroke prevention.  Lipid panel obtained after prior visit with LDL 59.  Blood pressure today 174/87 - hasn't taken medications yet today as she normally takes around 12pm.  Patient reports typically at home SBPs 140s. No further neurological concerns at this time.  Update 02/14/2019 Dr. SeLeonie Man she returns for follow-up after last visit 6 months ago.  Patient states about a week or 10 days ago she twisted her back when she stood up and had sudden onset of severe muscle tightness in cramp-like pain in the right posterior back extending to the flank and upper  abdomen.  She in fact was seen in the ER on 02/06/2019 for this and thought to have a muscle spasm.  She had an ultrasound of the right upper quadrant which showed no evidence of cholecystitis or any acute hepatic abnormality.  Chest x-ray showed no evidence of pneumonia.  Patient has been started on muscle relaxant Flexeril as well as Mobic and tramadol as needed.   She states her medicines are not obtaining substantial relief.  She did undergo EMG nerve conduction study by Dr. 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  No evidence of generalized peripheral neuropathy or radiculopathy was found.  Patient denies habitual squatting or recurrent injuries to her right knee.  She however does admit that she sleeps on her side mostly on the right side.  She also had a follow-up carotid ultrasound in 11/06/2018 which showed moderate for 40 to 59% right ICA and 1-39% left ICA stenosis.  Lipid profile on 08/02/2018 showed LDL cholesterol of 125 and hemoglobin A1c of 5.8.  Patient was started on Repatha injections 140 mg every 2 weeks and has been tolerating it well without injection site reactions or other side effects.  She however remains on Lipitor 80 mg as well.  She has not had any follow-up lipid profile checked.    Initial consult visit 08/02/2018 Dr. Leonie Man: Ms. Boule is a 70 year old African-American lady seen today for office consultation visit for frequent falls.  History is obtained from the patient, review of electronic medical records and have personally reviewed imaging films where available. She states that she has had a couple of falls since January.  She states the falls are minor they were all unprovoked.  She had no time to catch herself.  She did not lose consciousness.  She bruised her knee.  She was unable to get up without assistance.  She denies any significant pain in the back, radicular pain or pain in the feet.  She does have chronic numbness in her right foot.  She is has remote history of stroke in January 2018 when she had a right paramedian pontine lacunar infarct.  She did have some left leg incoordination but she did improve.  She denies any previous back surgeries.  She denies any lack of bowel or bladder control.  She does admit that she cannot walk far and has to sit down because her legs  hurt.  She has not had any follow-up neurovascular studies done since her stroke.  She did have a cerebral catheter angiogram on 06/30/2016 by Dr. 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  She was subsequently followed at vascular surgery clinic and last carotid ultrasound on 05/26/2018 had shown stable 60 to 79% right ICA stenosis.  She has not had any recurrent stroke or TIA symptoms.  She states she is on aspirin and tolerating it well without bleeding or bruising.  Her blood pressure usually is better controlled but today it is elevated in office at 156/85.  She is unable to tell me when she had her last lipid profile or hemoglobin A1c checked.  She does have diabetes and states that her control is adequate.  She has had some minor paresthesias in the feet and does not feel that this is getting worse.     ROS:   14 system review of systems is  positive for those listed in HPI and all other systems negative  PMH:  Past Medical History:  Diagnosis Date   Bilateral carotid artery stenosis    vascular-- Dagoberto Ligas PA --- last duplex in epic 10-06-2020 right ICA 60-79% and left ICA 40-59%   Bladder outlet obstruction    self caths   Bladder wall thickening    Blood transfusion without reported diagnosis    Coronary artery disease    cardiologist--- dr berry---- cath 03-25-2020 showed proxRCA 75%, distal RCA 50%, and midLAD 60%,  managed medically   Detrusor overactivity    urinary bladder   Esophageal varices ([place])    followed by dr Henrene Pastor---  nonbleeding   Fibroid, uterine    Full dentures    Hepatic cirrhosis due to chronic hepatitis C infection ([place])    followed by [place] NP---  s/p treated chronic hep c with cure and complicated by portal hypertension and esophageal varies   History of CVA (cerebrovascular accident) without residual deficits 06/2016    neurologist--- Frann Rider---  hospital admission 06-27-2016 right Pontine infart with  left MCA aneurysm,  no residual per pt   History of hepatitis C    post treatement and cured   Hyperlipidemia    Hypertension    IDA (iron deficiency anemia)    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 ;  followed by neurologist   Peripheral vascular disease ([place])    Self-catheterizes urinary bladder    Stroke ([place]) 2018   Thrombocytopenia ([place]) 12/25/2014   Type 2 diabetes mellitus ([place])    followed by pcp---- (01-14-2021 pt stated check blood sugar at home twice weekly fasting, did not know average)   Vaginal atrophy     Social History:  Social History   Socioeconomic History   Marital status: Married    Spouse name: Not on file   Number of children: 3   Years of education: Not on file   Highest education level: Not on file  Occupational History   Occupation: disabled  Tobacco Use   Smoking status: Never   Smokeless tobacco: Never  Vaping Use   Vaping Use: Never used  Substance and Sexual Activity   Alcohol use: Not Currently    Alcohol/week: 2.0 standard drinks    Types: 2 Cans of beer per week    Comment: 01-14-2021 per pt on the weekend, two beers   Drug use: Not Currently    Comment: 01-14-2021 last use cocaine last year 2018   Sexual activity: Yes    Partners: Male    Comment: married- 11 yrs   Other Topics Concern   Not on file  Social History Narrative   Not on file   Social Determinants of Health   Financial Resource Strain: Not on file  Food Insecurity: Not on file  Transportation Needs: Not on file  Physical Activity: Not on file  Stress: Not on file  Social Connections: Not on file  Intimate Partner Violence: Not on file    Medications:   Current Outpatient Medications on File Prior to Visit  Medication Sig Dispense Refill   Accu-Chek FastClix Lancets MISC 1 each by Does not apply route daily. 100 each 3   ACCU-CHEK GUIDE test strip USE TO test  AT BREAKFAST AND AT BEDTIME 100 strip 0   Accu-Chek Softclix Lancets lancets USE AS DIRECTED TO CHECK BLOOD SUGAR TWICE A DAY 100 each 0   amLODipine (NORVASC) 10 MG tablet TAKE ONE  TABLET BY MOUTH EVERY MORNING FOR BLOOD PRESSURE (Patient taking differently: Take 10 mg by mouth daily.) 90 tablet 1   ASPIRIN LOW DOSE 81 MG EC tablet TAKE 1 TABLET BY MOUTH EVERY DAY (Patient taking differently: Take 81 mg by mouth daily.) 90 tablet 1   Blood Glucose Monitoring Suppl (ACCU-CHEK GUIDE) w/Device KIT Please specify directions, refills and quantity 1 kit 0   carboxymethylcellulose (REFRESH PLUS) 0.5 % SOLN Place 1 drop into both eyes daily.     Catheters (SELF-CATH SYSTEM STRAIGHT TIP) KIT 1 each by Does not apply route as needed. 120 kit 11   chlorhexidine (PERIDEX) 0.12 % solution Use as directed 15 mLs in the mouth or throat 2 (two) times daily.     Cholecalciferol 25 MCG (1000 UT) capsule Take 1,000 Units by mouth daily.      Continuous Blood Gluc Receiver (FREESTYLE LIBRE 2 READER) DEVI 1 each by Does not apply route daily. 2 each 11   Continuous Blood Gluc Sensor (FREESTYLE LIBRE 2 SENSOR) MISC 1 each by Does not apply route daily. 2 each 11   cyanocobalamin 1000 MCG tablet Take 1,000 mcg by mouth daily.      estradiol (ESTRACE) 0.1 MG/GM vaginal cream Place 0.5 g vaginally 2 (two) times a week. Place 0.5g nightly for two weeks then twice a week after 30 g 11   ferrous sulfate 325 (65 FE) MG EC tablet Take 1 tablet (325 mg total) by mouth in the morning and at bedtime. (Patient taking differently: Take 325 mg by mouth in the morning and at bedtime.)  3   hydrochlorothiazide (HYDRODIURIL) 25 MG tablet TAKE ONE TABLET BY MOUTH EVERY MORNING FOR fluid AND FOR BLOOD PRESSURE (Patient taking differently: Take 25 mg by mouth daily.) 90 tablet 1   lidocaine (XYLOCAINE) 2 % jelly Apply 1 application topically as needed. (Patient taking differently: Apply 1 application topically as needed (pain).) 30 mL 5    metFORMIN (GLUCOPHAGE) 500 MG tablet TAKE ONE TABLET BY MOUTH EVERY MORNING FOR diabetes (Patient taking differently: Take 500 mg by mouth daily with breakfast.) 180 tablet 1   REPATHA SURECLICK 527 MG/ML SOAJ INJECT 139m into THE SKIN every 14 DAYS FOR cholesterol (Patient taking differently: Inject 140 mg into the skin every 14 (fourteen) days.) 2 mL 11   triamcinolone cream (KENALOG) 0.1 % APPLY TO THE AFFECTED AREA(S) TWICE DAILY FOR SKIN condition (Patient taking differently: Apply 1 application topically 2 (two) times daily as needed (Dry skin).) 80 g 4   Trospium Chloride 60 MG CP24 Take 1 capsule (60 mg total) by mouth daily. 30 capsule 5   omeprazole (PRILOSEC) 40 MG capsule Take 1 capsule (40 mg total) by mouth daily for 14 days. 14 capsule 0   No current facility-administered medications on file prior to visit.    Allergies:  No Known Allergies  Physical Exam  Today's Vitals   02/26/21 1425  BP: (!) 170/82  Pulse: 88  SpO2: 97%  Weight: 168 lb 4 oz (76.3 kg)  Height: 5' 4"  (1.626 m)   Body mass index is 28.88 kg/m.  General: Very Pleasant middle-aged African-American lady, seated, in no evident distress Head: head normocephalic and atraumatic.   Neck: supple with no carotid or supraclavicular bruits Cardiovascular: regular rate and rhythm, no murmurs Musculoskeletal: no deformity Skin:  no rash/petichiae Vascular:  Normal pulses all extremities  Neurologic Exam Mental Status: Awake and fully alert. Mild dysarthria although poor denture noted.  Oriented to place and  time. Recent and remote memory intact. Attention span, concentration and fund of knowledge appropriate. Mood and affect appropriate.  Cranial Nerves: Pupils equal, briskly reactive to light. Extraocular movements full without nystagmus. Visual fields full to confrontation. Hearing intact. Facial sensation intact. Face, tongue, palate moves normally and symmetrically.  Motor: Normal bulk and tone. Normal  strength in all tested extremity muscles  Sensory.: intact to touch , pinprick , position but diminished vibratory sensation over toes bilaterally.  Coordination: Rapid alternating movements normal in all extremities. Finger-to-nose and heel-to-shin performed accurately bilaterally. Gait and Station: Arises from chair without difficulty. Stance is normal. Gait demonstrates decreased stride length and step height without use of assistive device Reflexes: 1+ and symmetric except ankle jerks are diminished. Toes downgoing.      ASSESSMENT/PLAN: 70 year old African-American lady with recent episodes of falls likely multifactorial due to combination of underlying right peroneal neuropathy which has been stable as well as prior right pontine stroke in January 2018 and asymptomatic moderate right carotid stenosis.  Vascular risk factors of diabetes, hypertension, hyperlipidemia, stroke and carotid stenosis.      Hx R pontine stroke -Continue aspirin and Repatha for secondary stroke prevention  -Discussed secondary stroke prevention and importance of close PCP follow-up for aggressive stroke risk factor management -HTN: BP goal<130/90 - on amlodipine and hydrochlorothiazide -elevated today but per pt, stable at home.  Continue to follow with PCP for monitoring and management -HLD: LDL goal<70.  LDL 64 (08/2020) on Repatha -DM: A1c goal<7. A1c 6.3 (08/2020) on Metformin per PCP  B/l carotid stenosis -Continue yearly carotid ultrasound studies to follow her moderate right carotid stenosis with vascular surgery  -CUS 02/16/2021 right ICA 40 to 59% stenosis in left ICA 1 to 39% stenosis (per summary, shadowing from calcified plaque obscures higher velocities bilaterally) -CUS 07/09/2020 and 01/02/2020 stable R ICA 60 to 79% stenosis and L ICA 40 to 59% stenosis  Right peroneal neuropathy -stable since prior visit -No recent falls     Follow-up in 1 year or call earlier if needed   CC:  Isaac Bliss, Rayford Halsted, MD    I spent 26 minutes of face-to-face and non-face-to-face time with patient.  This included previsit chart review, lab review, study review, electronic health record documentation, patient education/discussion regarding right peroneal neuropathy, history of prior stroke and residual deficits, importance of managing stroke risk factors and routine surveillance monitoring of carotid stenosis and answered all other questions to patient satisfaction  Frann Rider, AGNP-BC  [facility] [address] [place], [place] [address]  Phone [phone] Fax [phone] Note: This document was prepared with digital dictation and possible smart phrase technology. Any transcriptional errors that result from this process are unintentional.

## 2021-02-27 DIAGNOSIS — R339 Retention of urine, unspecified: Secondary | ICD-10-CM | POA: Diagnosis not present

## 2021-03-04 ENCOUNTER — Other Ambulatory Visit: Payer: Self-pay | Admitting: Internal Medicine

## 2021-03-04 ENCOUNTER — Other Ambulatory Visit: Payer: Self-pay | Admitting: Obstetrics and Gynecology

## 2021-03-04 DIAGNOSIS — L2082 Flexural eczema: Secondary | ICD-10-CM

## 2021-03-04 DIAGNOSIS — N320 Bladder-neck obstruction: Secondary | ICD-10-CM

## 2021-03-05 ENCOUNTER — Telehealth: Payer: Self-pay | Admitting: Pharmacist

## 2021-03-05 NOTE — Chronic Care Management (AMB) (Signed)
Chronic Care Management Pharmacy Assistant   Name: Deborah Wise  MRN: [id number] DOB: 1950-11-22   Reason for Encounter: Medication Review/ Medication Coordination Call.   Recent office visits:  None.  Recent consult visits:  02/26/21 Frann Rider NP (Neurology) - seen for peroneal neuropathy. No medication changes. Follow up in 6 months.   02/16/21 Laurence Slate PA-C ( [facility]) - seen for bilateral extracranial carotid artery stenosis. No medication changes. Follow up in 1 year.   Hospital visits:  None in previous 6 months  Medications: Outpatient Encounter Medications as of 03/05/2021  Medication Sig Note   Accu-Chek FastClix Lancets MISC 1 each by Does not apply route daily.    ACCU-CHEK GUIDE test strip USE TO test AT BREAKFAST AND AT BEDTIME    Accu-Chek Softclix Lancets lancets USE AS DIRECTED TO CHECK BLOOD SUGAR TWICE A DAY    amLODipine (NORVASC) 10 MG tablet TAKE ONE TABLET BY MOUTH EVERY MORNING FOR BLOOD PRESSURE (Patient taking differently: Take 10 mg by mouth daily.)    ASPIRIN LOW DOSE 81 MG EC tablet TAKE 1 TABLET BY MOUTH EVERY DAY (Patient taking differently: Take 81 mg by mouth daily.)    Blood Glucose Monitoring Suppl (ACCU-CHEK GUIDE) w/Device KIT Please specify directions, refills and quantity    carboxymethylcellulose (REFRESH PLUS) 0.5 % SOLN Place 1 drop into both eyes daily.    Catheters (SELF-CATH SYSTEM STRAIGHT TIP) KIT 1 each by Does not apply route as needed.    chlorhexidine (PERIDEX) 0.12 % solution Use as directed 15 mLs in the mouth or throat 2 (two) times daily.    Cholecalciferol 25 MCG (1000 UT) capsule Take 1,000 Units by mouth daily.     Continuous Blood Gluc Receiver (FREESTYLE LIBRE 2 READER) DEVI 1 each by Does not apply route daily.    Continuous Blood Gluc Sensor (FREESTYLE LIBRE 2 SENSOR) MISC 1 each by Does not apply route daily.    cyanocobalamin 1000 MCG tablet Take 1,000 mcg by mouth daily.      estradiol (ESTRACE) 0.1 MG/GM vaginal cream Place 0.5 g vaginally 2 (two) times a week. Place 0.5g nightly for two weeks then twice a week after 01/14/2021: Per pt uses as needed   ferrous sulfate 325 (65 FE) MG EC tablet Take 1 tablet (325 mg total) by mouth in the morning and at bedtime. (Patient taking differently: Take 325 mg by mouth in the morning and at bedtime.)    hydrochlorothiazide (HYDRODIURIL) 25 MG tablet TAKE ONE TABLET BY MOUTH EVERY MORNING FOR fluid AND FOR BLOOD PRESSURE (Patient taking differently: Take 25 mg by mouth daily.)    lidocaine (XYLOCAINE) 2 % jelly Apply 1 application topically as needed. (Patient taking differently: Apply 1 application topically as needed (pain).)    metFORMIN (GLUCOPHAGE) 500 MG tablet TAKE ONE TABLET BY MOUTH EVERY MORNING FOR diabetes (Patient taking differently: Take 500 mg by mouth daily with breakfast.)    omeprazole (PRILOSEC) 40 MG capsule Take 1 capsule (40 mg total) by mouth daily for 14 days.    REPATHA SURECLICK 003 MG/ML SOAJ INJECT 15m into THE SKIN every 14 DAYS FOR cholesterol (Patient taking differently: Inject 140 mg into the skin every 14 (fourteen) days.)    triamcinolone cream (KENALOG) 0.1 % APPLY TO THE AFFECTED AREA(S) TWICE DAILY FOR SKIN condition    Trospium Chloride 60 MG CP24 Take 1 capsule (60 mg total) by mouth daily.    No facility-administered encounter medications on file  as of 03/05/2021.   Fill History: chlorhexidine gluconate 0.12 % mouthwash 11/12/2020 15   estradiol 0.01% (0.1 mg/gram) vaginal cream 02/06/2021 30   Repatha SureClick 998 mg/mL subcutaneous pen injector 02/06/2021 28   Accu-Chek Guide test strips 02/12/2021 50   omeprazole 40 mg capsule,delayed release 02/03/2021 14   tamsulosin 0.4 mg capsule 02/12/2021 30   triamcinolone acetonide 0.1 % topical cream 02/06/2021 30   trospium ER 60 mg capsule,extended release 24 hr 12/10/2020 30   amlodipine 10 mg tablet 02/12/2021 30    hydrochlorothiazide 25 mg tablet 02/12/2021 30   metformin 500 mg tablet 02/12/2021 30   Reviewed chart for medication changes ahead of medication coordination call.  No OVs, Consults, or hospital visits since last care coordination call/Pharmacist visit. (If appropriate, list visit date, provider name)  No medication changes indicated OR if recent visit, treatment plan here.  BP Readings from Last 3 Encounters:  02/26/21 (!) 170/82  02/16/21 (!) 177/88  02/02/21 (!) 186/87    Lab Results  Component Value Date   HGBA1C 6.3 08/15/2020     Patient obtains medications through Vials and packages.  30 Days   Last adherence delivery included:  Repatha Sureclick 338 MG/ML: inject every 14 days Triamcinolone cream (KENALOG) 0.1 %: apply to affected areas twice daily Amlodipine 10 mg: one tablet at breakfast Aspirin 81 mg: one tablet at breakfast Vitamin D3 MCG 1000-unit: one capsule at breakfast Vitamin B-12 Daily 1000 MCG: one tablet at breakfast Hydrochlorothiazide 25 mg: one tablet at breakfast Metformin 500 mg: one tablet at breakfast Tamsulosin HCl 0.4 mg: one capsule at breakfast Ferrous sulfate 325 (65 FE) MG EC tablet: one tablet at breakfast and one tablet at bedtime Estradiol 0.5 g Vaginal 2 times weekly, Place 0.5 g nightly for two weeks then twice a week after Accu chek lancets - use to check blood sugar twice daily Accu chek test strips - use to check blood sugar twice daily.    Patient declined: Trospium Chloride 60 mg Oral Daily  Patient is due for next adherence delivery on: 03/16/21. Called patient and reviewed medications and coordinated delivery.  This delivery to include: Repatha Sureclick 250 MG/ML: inject every 14 days Triamcinolone cream (KENALOG) 0.1 %: apply to affected areas twice daily Amlodipine 10 mg: one tablet at breakfast Aspirin 81 mg: one tablet at breakfast Vitamin D3 MCG 1000-unit: one capsule at breakfast Vitamin B-12 Daily 1000 MCG:  one tablet at breakfast Hydrochlorothiazide 25 mg: one tablet at breakfast Metformin 500 mg: one tablet at breakfast Tamsulosin HCl 0.4 mg: one capsule at breakfast Ferrous sulfate 325 (65 FE) MG EC tablet: one tablet at breakfast and one tablet at bedtime Estradiol 0.5 g Vaginal 2 times weekly, Place 0.5 g nightly for two weeks then twice a week after Trospium Chloride 60 mg Oral Daily  Patient declined the following medication due to having plenty of test strips and lancets at home. Accu chek test strips - use to check blood sugar twice daily.  Accu chek lancets - use to check blood sugar twice daily.  Confirmed delivery date of 03/16/21, advised patient that pharmacy will contact them the morning of delivery.   Care Gaps:  AWV - message sent to Ramond Craver CMA to schedule.  Ophthalmology exam - never done Zoster vaccines [facility]) - never done DEXA SCAN -  never done Urine microalbumin - overdue since 06/23/19 Foot exam - overdue since 10/28//21 Covid-19 vaccine -  booster 4 overdue since 09/26/20 Influenza vaccine -  overdue since 01/05/21   Star Rating Drugs:  Metformin 527m - [facility]21 [facility] ([phone]

## 2021-03-06 DIAGNOSIS — I10 Essential (primary) hypertension: Secondary | ICD-10-CM | POA: Diagnosis not present

## 2021-03-06 DIAGNOSIS — E785 Hyperlipidemia, unspecified: Secondary | ICD-10-CM | POA: Diagnosis not present

## 2021-03-06 DIAGNOSIS — E1169 Type 2 diabetes mellitus with other specified complication: Secondary | ICD-10-CM | POA: Diagnosis not present

## 2021-03-09 ENCOUNTER — Other Ambulatory Visit: Payer: Self-pay | Admitting: Obstetrics and Gynecology

## 2021-03-09 DIAGNOSIS — N320 Bladder-neck obstruction: Secondary | ICD-10-CM

## 2021-03-16 ENCOUNTER — Telehealth: Payer: Self-pay

## 2021-03-16 NOTE — Telephone Encounter (Signed)
Patient called asking that authorization for transportation to Dr's appt  phone # [phone] pt has appt scheduled on 10/11

## 2021-03-16 NOTE — Telephone Encounter (Signed)
Patient called again to follow up on authorization for transportation to appointment tomorrow. I let patient know Dr.Hernandez was out of the office, but I could send another message back. Patient states she just needs someone from the office to authorize the transportation by calling the number in the previous message. States it just needs to be someone [facility] affiliated.     Please Advise

## 2021-03-17 DIAGNOSIS — H40033 Anatomical narrow angle, bilateral: Secondary | ICD-10-CM | POA: Diagnosis not present

## 2021-03-17 DIAGNOSIS — H43392 Other vitreous opacities, left eye: Secondary | ICD-10-CM | POA: Diagnosis not present

## 2021-03-17 DIAGNOSIS — H2513 Age-related nuclear cataract, bilateral: Secondary | ICD-10-CM | POA: Diagnosis not present

## 2021-03-17 DIAGNOSIS — H4322 Crystalline deposits in vitreous body, left eye: Secondary | ICD-10-CM | POA: Diagnosis not present

## 2021-03-17 DIAGNOSIS — H40023 Open angle with borderline findings, high risk, bilateral: Secondary | ICD-10-CM | POA: Diagnosis not present

## 2021-03-17 NOTE — Telephone Encounter (Signed)
I spoke with a representative for transportation services and she stated she would contact the pt to gather more information on where the pt needs to be transported. The representative stated she would return my call to the office if any further information is required.

## 2021-03-20 DIAGNOSIS — R339 Retention of urine, unspecified: Secondary | ICD-10-CM | POA: Diagnosis not present

## 2021-03-26 ENCOUNTER — Telehealth: Payer: Self-pay

## 2021-03-26 NOTE — Telephone Encounter (Signed)
Pt states she requested a refill of medication last week.  Pt last OV 08/15/20, PCP instructed return in 4mo & no appt noted. Appt scheduled for 04/08/21.

## 2021-04-01 ENCOUNTER — Other Ambulatory Visit: Payer: Self-pay | Admitting: Internal Medicine

## 2021-04-01 ENCOUNTER — Other Ambulatory Visit: Payer: Self-pay | Admitting: Obstetrics and Gynecology

## 2021-04-01 DIAGNOSIS — N3281 Overactive bladder: Secondary | ICD-10-CM

## 2021-04-01 DIAGNOSIS — N320 Bladder-neck obstruction: Secondary | ICD-10-CM

## 2021-04-02 ENCOUNTER — Telehealth: Payer: Self-pay | Admitting: Pharmacist

## 2021-04-02 NOTE — Chronic Care Management (AMB) (Signed)
Chronic Care Management Pharmacy Assistant   Name: Deborah Wise  MRN: [id number] DOB: Apr 10, 1951  Reason for Encounter: Disease State and Medication Review   Conditions to be addressed/monitored: HTN  Recent office visits:  None.  Recent consult visits:  None.  Hospital visits:  None in previous 6 months  Medications: Outpatient Encounter Medications as of 04/02/2021  Medication Sig Note   Accu-Chek FastClix Lancets MISC 1 each by Does not apply route daily.    ACCU-CHEK GUIDE test strip USE TO test DAILY EVERY MORNING and EVERY EVENING    Accu-Chek Softclix Lancets lancets USE AS DIRECTED TO CHECK BLOOD SUGAR TWICE A DAY    amLODipine (NORVASC) 10 MG tablet TAKE ONE TABLET BY MOUTH EVERY MORNING FOR BLOOD PRESSURE (Patient taking differently: Take 10 mg by mouth daily.)    ASPIRIN LOW DOSE 81 MG EC tablet TAKE 1 TABLET BY MOUTH EVERY DAY (Patient taking differently: Take 81 mg by mouth daily.)    Blood Glucose Monitoring Suppl (ACCU-CHEK GUIDE) w/Device KIT Please specify directions, refills and quantity    carboxymethylcellulose (REFRESH PLUS) 0.5 % SOLN Place 1 drop into both eyes daily.    Catheters (SELF-CATH SYSTEM STRAIGHT TIP) KIT 1 each by Does not apply route as needed.    chlorhexidine (PERIDEX) 0.12 % solution Use as directed 15 mLs in the mouth or throat 2 (two) times daily.    Cholecalciferol 25 MCG (1000 UT) capsule Take 1,000 Units by mouth daily.     Continuous Blood Gluc Receiver (FREESTYLE LIBRE 2 READER) DEVI 1 each by Does not apply route daily.    Continuous Blood Gluc Sensor (FREESTYLE LIBRE 2 SENSOR) MISC 1 each by Does not apply route daily.    cyanocobalamin 1000 MCG tablet Take 1,000 mcg by mouth daily.     estradiol (ESTRACE) 0.1 MG/GM vaginal cream Place 0.5 g vaginally 2 (two) times a week. Place 0.5g nightly for two weeks then twice a week after 01/14/2021: Per pt uses as needed   ferrous sulfate 325 (65 FE) MG EC tablet Take 1 tablet (325  mg total) by mouth in the morning and at bedtime. (Patient taking differently: Take 325 mg by mouth in the morning and at bedtime.)    hydrochlorothiazide (HYDRODIURIL) 25 MG tablet TAKE ONE TABLET BY MOUTH EVERY MORNING FOR fluid AND FOR BLOOD PRESSURE (Patient taking differently: Take 25 mg by mouth daily.)    lidocaine (XYLOCAINE) 2 % jelly Apply 1 application topically as needed. (Patient taking differently: Apply 1 application topically as needed (pain).)    metFORMIN (GLUCOPHAGE) 500 MG tablet TAKE ONE TABLET BY MOUTH EVERY MORNING FOR diabetes (Patient taking differently: Take 500 mg by mouth daily with breakfast.)    omeprazole (PRILOSEC) 40 MG capsule Take 1 capsule (40 mg total) by mouth daily for 14 days.    REPATHA SURECLICK 494 MG/ML SOAJ INJECT 144m into THE SKIN every 14 DAYS FOR cholesterol (Patient taking differently: Inject 140 mg into the skin every 14 (fourteen) days.)    tamsulosin (FLOMAX) 0.4 MG CAPS capsule TAKE ONE CAPSULE BY MOUTH DAILY FOR FOR BLADDER    triamcinolone cream (KENALOG) 0.1 % APPLY TO THE AFFECTED AREA(S) TWICE DAILY FOR SKIN condition    Trospium Chloride 60 MG CP24 TAKE ONE CAPSULE BY MOUTH EVERY MORNING    No facility-administered encounter medications on file as of 04/02/2021.   Fill History: estradiol 0.01% (0.1 mg/gram) vaginal cream 03/09/2021 30   Repatha SureClick 1496mg/mL subcutaneous pen  injector 03/09/2021 28   Accu-Chek Guide test strips 02/12/2021 50   tamsulosin 0.4 mg capsule 02/12/2021 30   triamcinolone acetonide 0.1 % topical cream 03/09/2021 30   trospium ER 60 mg capsule,extended release 24 hr 03/09/2021 30   amlodipine 10 mg tablet 03/09/2021 30    hydrochlorothiazide 25 mg tablet 03/09/2021 30   metformin 500 mg tablet 03/09/2021 30   Reviewed chart prior to disease state call. Spoke with patient regarding BP  Recent Office Vitals: BP Readings from Last 3 Encounters:  02/26/21 (!) 170/82  02/16/21 (!) 177/88   02/02/21 (!) 186/87   Pulse Readings from Last 3 Encounters:  02/26/21 88  02/16/21 100  02/02/21 84    Wt Readings from Last 3 Encounters:  02/26/21 168 lb 4 oz (76.3 kg)  02/16/21 167 lb (75.8 kg)  01/22/21 167 lb (75.8 kg)     Kidney Function Lab Results  Component Value Date/Time   CREATININE 0.76 01/19/2021 10:16 AM   CREATININE 0.75 10/20/2020 11:54 AM   CREATININE 0.8 12/31/2014 02:29 PM   GFR 85.84 08/15/2020 08:12 AM   GFRNONAA >60 01/19/2021 10:16 AM   GFRAA 84 03/18/2020 03:56 PM    BMP Latest Ref Rng & Units 01/19/2021 10/20/2020 08/15/2020  Glucose 70 - 99 mg/dL 124(H) 120(H) 108(H)  BUN 8 - 23 mg/dL 13 11 20   Creatinine 0.44 - 1.00 mg/dL 0.76 0.75 0.72  BUN/Creat Ratio 12 - 28 - - -  Sodium 135 - 145 mmol/L 138 139 138  Potassium 3.5 - 5.1 mmol/L 4.8 3.7 3.7  Chloride 98 - 111 mmol/L 107 106 101  CO2 22 - 32 mmol/L 24 24 25   Calcium 8.9 - 10.3 mg/dL 9.6 9.7 9.9    Current antihypertensive regimen:  Amlodipine 53m - take 1 tablet by mouth every morning. Hydrochlorothiazide 266m- take 1 tablet by mouth every morning for blood pressure and fluid.  How often are you checking your Blood Pressure? infrequently Current home BP readings: 130/80 on 03/30/21. What recent interventions/DTPs have been made by any provider to improve Blood Pressure control since last CPP Visit: None. Any recent hospitalizations or ED visits since last visit with CPP? No  Adherence Review: Is the patient currently on ACE/ARB medication? No Does the patient have >5 day gap between last estimated fill dates? No   Reviewed chart for medication changes ahead of medication coordination call.  No OVs, Consults, or hospital visits since last care coordination call/Pharmacist visit. (If appropriate, list visit date, provider name)  No medication changes indicated OR if recent visit, treatment plan here.  BP Readings from Last 3 Encounters:  02/26/21 (!) 170/82  02/16/21 (!) 177/88   02/02/21 (!) 186/87    Lab Results  Component Value Date   HGBA1C 6.3 08/15/2020     Patient obtains medications through Vials  30 Days   Last adherence delivery included:  Repatha Sureclick 14572G/ML: inject every 14 days Triamcinolone cream (KENALOG) 0.1 %: apply to affected areas twice daily Amlodipine 10 mg: one tablet at breakfast Aspirin 81 mg: one tablet at breakfast Vitamin D3 MCG 1000-unit: one capsule at breakfast Vitamin B-12 Daily 1000 MCG: one tablet at breakfast Hydrochlorothiazide 25 mg: one tablet at breakfast Metformin 500 mg: one tablet at breakfast Tamsulosin HCl 0.4 mg: one capsule at breakfast Ferrous sulfate 325 (65 FE) MG EC tablet: one tablet at breakfast and one tablet at bedtime Estradiol 0.5 g Vaginal 2 times weekly, Place 0.5 g nightly for two weeks  then twice a week after Trospium Chloride 60 mg Oral Daily  Patient declined the following medications due to having plenty of test strips and lancets at home. Accu chek test strips - use to check blood sugar twice daily.  Accu chek lancets - use to check blood sugar twice daily.  Patient is due for next adherence delivery on: 04/14/21. Called patient and reviewed medications and coordinated delivery.  This delivery to include: Repatha Sureclick 833 MG/ML: inject every 14 days Triamcinolone cream (KENALOG) 0.1 %: apply to affected areas twice daily Amlodipine 10 mg: one tablet at breakfast Aspirin 81 mg: one tablet at breakfast Vitamin D3 MCG 1000-unit: one capsule at breakfast Vitamin B-12 Daily 1000 MCG: one tablet at breakfast Hydrochlorothiazide 25 mg: one tablet at breakfast Metformin 500 mg: one tablet at breakfast Tamsulosin HCl 0.4 mg: one capsule at breakfast Ferrous sulfate 325 (65 FE) MG EC tablet: one tablet at breakfast and one tablet at bedtime Estradiol 0.5 g Vaginal 2 times weekly, Place 0.5 g nightly for two weeks then twice a week after Trospium Chloride 60 mg Oral Daily Accu chek  test strips - use to check blood sugar twice daily.   Patient declined the following medications (meds) due to (reason) Accu chek lancets - use to check blood sugar twice daily.   Confirmed delivery date of 04/14/21, advised patient that pharmacy will contact them the morning of delivery.   Notes: Spoke with patient and reviewed medications as prescribed. Patient reports taking all medications and no issues at this time. Patient requested her medications be delivered in vials with safety caps moving forward. Patient checks her blood pressure when she gets a headache. I reminded patient to check weekly and keep a log. Patient was agreeable. Patient does add salt to her food but she does not eat red meat but once in a while. Patient drinks plenty of water. Patient gets on her treadmill for about 30 minutes everyday. Patient thanked me for my call.  Care Gaps:  AWV - message sent to Ramond Craver CMA to schedule.  Ophthalmology exam - never done Zoster vaccines [facility]) - never done DEXA SCAN -  never done Urine microalbumin - overdue since 06/23/19 Foot exam - overdue since 10/28//21 Covid-19 vaccine -  booster 4 overdue since 09/26/20 Influenza vaccine - overdue since 01/05/21  Star Rating Drugs:  Metformin 555m - last [facility] on 03/09/21 [facility] [phone]

## 2021-04-07 ENCOUNTER — Other Ambulatory Visit: Payer: Self-pay

## 2021-04-08 ENCOUNTER — Ambulatory Visit: Payer: Medicare Other | Admitting: Internal Medicine

## 2021-04-27 ENCOUNTER — Telehealth: Payer: Self-pay | Admitting: Pharmacist

## 2021-04-27 NOTE — Chronic Care Management (AMB) (Signed)
    Chronic Care Management Pharmacy Assistant   Name: Deborah Wise  MRN: [id number] DOB: 10-19-1950  Reason for Encounter: No fill report for 04/14/2021 delivery.  Medications showing as not delivered: HCTZ, Amlodipine and Repatha. In Epic, medication dispense history:  HCTZ was filled 04/07/2021 30 DS Amlodipine filled 04/07/2021 30 DS Repatha filled 04/07/2021 28 DS  Spoke with patient, she states she received Repatha with her last delivery and found old prescriptions of HCTZ and Amlodipine and asked for these two to not be delivered, she states she won't need anymore medications until her next delivery date.   04/14/2021 delivery to include: Repatha Sureclick 141 MG/ML: inject every 14 days Triamcinolone cream (KENALOG) 0.1 %: apply to affected areas twice daily Amlodipine 10 mg: one tablet at breakfast Aspirin 81 mg: one tablet at breakfast Vitamin D3 MCG 1000-unit: one capsule at breakfast Vitamin B-12 Daily 1000 MCG: one tablet at breakfast Hydrochlorothiazide 25 mg: one tablet at breakfast Metformin 500 mg: one tablet at breakfast Tamsulosin HCl 0.4 mg: one capsule at breakfast Ferrous sulfate 325 (65 FE) MG EC tablet: one tablet at breakfast and one tablet at bedtime Estradiol 0.5 g Vaginal 2 times weekly, Place 0.5 g nightly for two weeks then twice a week after Trospium Chloride 60 mg Oral Daily Accu chek test strips - use to check blood sugar twice daily.     Care Gaps: AWV - message sent to Ramond Craver CMA to schedule.  Ophthalmology exam - never done Zoster vaccines [facility]) - never done DEXA SCAN -  never done Urine microalbumin - overdue since 06/23/19 Foot exam - overdue since 10/28//21 Covid-19 vaccine -  booster 4 overdue since 09/26/20 Influenza vaccine - overdue since 01/05/21  Star Rating Drugs: Metformin 500mg  - last filled on 04/07/2021 30DS at [place] [phone]

## 2021-04-29 DIAGNOSIS — R3914 Feeling of incomplete bladder emptying: Secondary | ICD-10-CM | POA: Diagnosis not present

## 2021-05-04 ENCOUNTER — Telehealth: Payer: Self-pay | Admitting: Pharmacist

## 2021-05-04 NOTE — Chronic Care Management (AMB) (Signed)
  Chronic Care Management Pharmacy Assistant   Name: Deborah Wise  MRN: [id number] DOB: 02/23/1951  Reason for Encounter: Medication Review / Medication Coordination Call   Conditions to be addressed/monitored: HTN  Recent office visits:  None  Recent consult visits:  None  Hospital visits:  None  Medications: Outpatient Encounter Medications as of 05/04/2021  Medication Sig Note   Accu-Chek FastClix Lancets MISC 1 each by Does not apply route daily.    ACCU-CHEK GUIDE test strip USE TO test DAILY EVERY MORNING and EVERY EVENING    Accu-Chek Softclix Lancets lancets USE AS DIRECTED TO CHECK BLOOD SUGAR TWICE A DAY    amLODipine (NORVASC) 10 MG tablet TAKE ONE TABLET BY MOUTH EVERY MORNING FOR BLOOD PRESSURE (Patient taking differently: Take 10 mg by mouth daily.)    ASPIRIN LOW DOSE 81 MG EC tablet TAKE 1 TABLET BY MOUTH EVERY DAY (Patient taking differently: Take 81 mg by mouth daily.)    Blood Glucose Monitoring Suppl (ACCU-CHEK GUIDE) w/Device KIT Please specify directions, refills and quantity    carboxymethylcellulose (REFRESH PLUS) 0.5 % SOLN Place 1 drop into both eyes daily.    Catheters (SELF-CATH SYSTEM STRAIGHT TIP) KIT 1 each by Does not apply route as needed.    chlorhexidine (PERIDEX) 0.12 % solution Use as directed 15 mLs in the mouth or throat 2 (two) times daily.    Cholecalciferol 25 MCG (1000 UT) capsule Take 1,000 Units by mouth daily.     Continuous Blood Gluc Receiver (FREESTYLE LIBRE 2 READER) DEVI 1 each by Does not apply route daily.    Continuous Blood Gluc Sensor (FREESTYLE LIBRE 2 SENSOR) MISC 1 each by Does not apply route daily.    cyanocobalamin 1000 MCG tablet Take 1,000 mcg by mouth daily.     estradiol (ESTRACE) 0.1 MG/GM vaginal cream Place 0.5 g vaginally 2 (two) times a week. Place 0.5g nightly for two weeks then twice a week after 01/14/2021: Per pt uses as needed   ferrous sulfate 325 (65 FE) MG EC tablet Take 1 tablet (325 mg total)  by mouth in the morning and at bedtime. (Patient taking differently: Take 325 mg by mouth in the morning and at bedtime.)    hydrochlorothiazide (HYDRODIURIL) 25 MG tablet TAKE ONE TABLET BY MOUTH EVERY MORNING FOR fluid AND FOR BLOOD PRESSURE (Patient taking differently: Take 25 mg by mouth daily.)    lidocaine (XYLOCAINE) 2 % jelly Apply 1 application topically as needed. (Patient taking differently: Apply 1 application topically as needed (pain).)    metFORMIN (GLUCOPHAGE) 500 MG tablet TAKE ONE TABLET BY MOUTH EVERY MORNING FOR diabetes (Patient taking differently: Take 500 mg by mouth daily with breakfast.)    omeprazole (PRILOSEC) 40 MG capsule Take 1 capsule (40 mg total) by mouth daily for 14 days.    REPATHA SURECLICK 140 MG/ML SOAJ INJECT 140mg into THE SKIN every 14 DAYS FOR cholesterol (Patient taking differently: Inject 140 mg into the skin every 14 (fourteen) days.)    tamsulosin (FLOMAX) 0.4 MG CAPS capsule TAKE ONE CAPSULE BY MOUTH DAILY FOR FOR BLADDER    triamcinolone cream (KENALOG) 0.1 % APPLY TO THE AFFECTED AREA(S) TWICE DAILY FOR SKIN condition    Trospium Chloride 60 MG CP24 TAKE ONE CAPSULE BY MOUTH EVERY MORNING    No facility-administered encounter medications on file as of 05/04/2021.  Reviewed chart for medication changes ahead of medication coordination call.  No OVs, Consults, or hospital visits since last care coordination call/Pharmacist   visit. (If appropriate, list visit date, provider name)  No medication changes indicated OR if recent visit, treatment plan here.  BP Readings from Last 3 Encounters:  02/26/21 (!) 170/82  02/16/21 (!) 177/88  02/02/21 (!) 186/87    Lab Results  Component Value Date   HGBA1C 6.3 08/15/2020     Patient obtains medications through Adherence Packaging  30 Days   Last adherence delivery included: Repatha Sureclick 165 MG/ML: inject every 14 days Triamcinolone cream (KENALOG) 0.1 %: apply to affected areas twice  daily Aspirin 81 mg: one tablet at breakfast Vitamin D3 MCG 1000-unit: one capsule at breakfast Vitamin B-12 Daily 1000 MCG: one tablet at breakfast Metformin 500 mg: one tablet at breakfast Tamsulosin HCl 0.4 mg: one capsule at breakfast Ferrous sulfate 325 (65 FE) MG EC tablet: one tablet at breakfast and one tablet at bedtime Estradiol 0.5 g Vaginal 2 times weekly, Place 0.5 g nightly for two weeks then twice a week after Trospium Chloride 60 mg Oral Daily Accu chek test strips - use to check blood sugar twice daily.   Patient declined (meds) last month: Amlodipine and HCTZ as she found old prescriptions and would not need them til next delivery.   Patient is due for next adherence delivery on: 05/14/2021.  Called patient and reviewed medications and coordinated delivery.  This delivery to include: Repatha Sureclick 790 MG/ML: inject every 14 days Triamcinolone cream (KENALOG) 0.1 %: apply to affected areas twice daily Amlodipine 10 mg: one tablet at breakfast Aspirin 81 mg: one tablet at breakfast Vitamin D3 MCG 1000-unit: one capsule at breakfast Vitamin B-12 Daily 1000 MCG: one tablet at breakfast Hydrochlorothiazide 25 mg: one tablet at breakfast Metformin 500 mg: one tablet at breakfast Tamsulosin HCl 0.4 mg: one capsule at breakfast Ferrous sulfate 325 (65 FE) MG EC tablet: one tablet at breakfast and one tablet at bedtime Trospium Chloride 60 mg Oral Daily Accu chek test strips - use to check blood sugar twice daily.   Patient will need a short fill:None  Coordinated acute fill:None  Patient declined the following medications: Estradiol 0.5 g Vaginal she states she has plenty on hand to last til next delivery.  Confirmed delivery date of 05/14/2021, advised patient that pharmacy will contact them the morning of delivery.   Care Gaps: AWV - message sent to Ramond Craver CMA to schedule.  Ophthalmology exam - never done Zoster vaccines [facility]) - never done DEXA  SCAN -  never done Urine microalbumin - overdue  Foot exam - overdue  Covid-19 vaccine -  overdue Influenza vaccine - overdue Last BP 170/82 on 02/26/2021  Star Rating Drugs: Metformin 580m - last [facility] [place] 04[place]07/2021 [facility] [phone]

## 2021-05-22 DIAGNOSIS — R339 Retention of urine, unspecified: Secondary | ICD-10-CM | POA: Diagnosis not present

## 2021-06-02 ENCOUNTER — Telehealth: Payer: Self-pay | Admitting: Pharmacist

## 2021-06-02 NOTE — Chronic Care Management (AMB) (Signed)
Chronic Care Management Pharmacy Assistant   Name: Deborah Wise  MRN: [id number] DOB: 1950/07/28  Reason for Encounter: Medication Review / Medication Coordination Call   Conditions to be addressed/monitored: HTN  Recent office visits:  None  Recent consult visits:  None  Hospital visits:  None  Medications: Outpatient Encounter Medications as of 06/02/2021  Medication Sig Note   Accu-Chek FastClix Lancets MISC 1 each by Does not apply route daily.    ACCU-CHEK GUIDE test strip USE TO test DAILY EVERY MORNING and EVERY EVENING    Accu-Chek Softclix Lancets lancets USE AS DIRECTED TO CHECK BLOOD SUGAR TWICE A DAY    amLODipine (NORVASC) 10 MG tablet TAKE ONE TABLET BY MOUTH EVERY MORNING FOR BLOOD PRESSURE (Patient taking differently: Take 10 mg by mouth daily.)    ASPIRIN LOW DOSE 81 MG EC tablet TAKE 1 TABLET BY MOUTH EVERY DAY (Patient taking differently: Take 81 mg by mouth daily.)    Blood Glucose Monitoring Suppl (ACCU-CHEK GUIDE) w/Device KIT Please specify directions, refills and quantity    carboxymethylcellulose (REFRESH PLUS) 0.5 % SOLN Place 1 drop into both eyes daily.    Catheters (SELF-CATH SYSTEM STRAIGHT TIP) KIT 1 each by Does not apply route as needed.    chlorhexidine (PERIDEX) 0.12 % solution Use as directed 15 mLs in the mouth or throat 2 (two) times daily.    Cholecalciferol 25 MCG (1000 UT) capsule Take 1,000 Units by mouth daily.     Continuous Blood Gluc Receiver (FREESTYLE LIBRE 2 READER) DEVI 1 each by Does not apply route daily.    Continuous Blood Gluc Sensor (FREESTYLE LIBRE 2 SENSOR) MISC 1 each by Does not apply route daily.    cyanocobalamin 1000 MCG tablet Take 1,000 mcg by mouth daily.     estradiol (ESTRACE) 0.1 MG/GM vaginal cream Place 0.5 g vaginally 2 (two) times a week. Place 0.5g nightly for two weeks then twice a week after 01/14/2021: Per pt uses as needed   ferrous sulfate 325 (65 FE) MG EC tablet Take 1 tablet (325 mg total)  by mouth in the morning and at bedtime. (Patient taking differently: Take 325 mg by mouth in the morning and at bedtime.)    hydrochlorothiazide (HYDRODIURIL) 25 MG tablet TAKE ONE TABLET BY MOUTH EVERY MORNING FOR fluid AND FOR BLOOD PRESSURE (Patient taking differently: Take 25 mg by mouth daily.)    lidocaine (XYLOCAINE) 2 % jelly Apply 1 application topically as needed. (Patient taking differently: Apply 1 application topically as needed (pain).)    metFORMIN (GLUCOPHAGE) 500 MG tablet TAKE ONE TABLET BY MOUTH EVERY MORNING FOR diabetes (Patient taking differently: Take 500 mg by mouth daily with breakfast.)    omeprazole (PRILOSEC) 40 MG capsule Take 1 capsule (40 mg total) by mouth daily for 14 days.    REPATHA SURECLICK 662 MG/ML SOAJ INJECT 143m into THE SKIN every 14 DAYS FOR cholesterol (Patient taking differently: Inject 140 mg into the skin every 14 (fourteen) days.)    tamsulosin (FLOMAX) 0.4 MG CAPS capsule TAKE ONE CAPSULE BY MOUTH DAILY FOR FOR BLADDER    triamcinolone cream (KENALOG) 0.1 % APPLY TO THE AFFECTED AREA(S) TWICE DAILY FOR SKIN condition    Trospium Chloride 60 MG CP24 TAKE ONE CAPSULE BY MOUTH EVERY MORNING    No facility-administered encounter medications on file as of 06/02/2021.   Reviewed chart for medication changes ahead of medication coordination call.  No OVs, Consults, or hospital visits since last care  coordination call/Pharmacist visit. (If appropriate, list visit date, provider name)  No medication changes indicated OR if recent visit, treatment plan here.  BP Readings from Last 3 Encounters:  02/26/21 (!) 170/82  02/16/21 (!) 177/88  02/02/21 (!) 186/87    Lab Results  Component Value Date   HGBA1C 6.3 08/15/2020     Patient obtains medications through Adherence Packaging  30 Days    Last adherence delivery included: Repatha Sureclick 834 MG/ML: inject every 14 days Triamcinolone cream (KENALOG) 0.1 %: apply to affected areas twice  daily Amlodipine 10 mg: one tablet at breakfast Aspirin 81 mg: one tablet at breakfast Vitamin D3 MCG 1000-unit: one capsule at breakfast Vitamin B-12 Daily 1000 MCG: one tablet at breakfast Hydrochlorothiazide 25 mg: one tablet at breakfast Metformin 500 mg: one tablet at breakfast Tamsulosin HCl 0.4 mg: one capsule at breakfast Ferrous sulfate 325 (65 FE) MG EC tablet: one tablet at breakfast and one tablet at bedtime Trospium Chloride 60 mg Oral Daily Accu chek test strips - use to check blood sugar twice daily.    Patient declined (meds) last month:Estradiol 0.5 g Vaginal she states she has plenty on hand to last til next delivery.   Patient is due for next adherence delivery on: 06/12/2021.   Called patient and reviewed medications and coordinated delivery.   This delivery to include: Repatha Sureclick 196 MG/ML: inject every 14 days Triamcinolone cream (KENALOG) 0.1 %: apply to affected areas twice daily Amlodipine 10 mg: one tablet at breakfast Aspirin 81 mg: one tablet at breakfast Vitamin D3 MCG 1000-unit: one capsule at breakfast Vitamin B-12 Daily 1000 MCG: one tablet at breakfast Hydrochlorothiazide 25 mg: one tablet at breakfast Metformin 500 mg: one tablet at breakfast Tamsulosin HCl 0.4 mg: one capsule at breakfast Ferrous sulfate 325 (65 FE) MG EC tablet: one tablet at breakfast and one tablet at bedtime Trospium Chloride 60 mg Oral Daily Accu chek test strips - use to check blood sugar twice daily.    Patient will need a short fill:None   Coordinated acute fill:None   Patient declined the following medications:    Confirmed delivery date of 06/12/2021, advised patient that pharmacy will contact them the morning of delivery.  Care Gaps: AWV - message previously sent to Ramond Craver.  Ophthalmology exam - never done Zoster vaccines [facility]) - never done DEXA SCAN -  never done Urine microalbumin - overdue  Foot exam - overdue  Covid-19 vaccine -   overdue Influenza vaccine - overdue Last BP 170/82 on 02/26/2021 Last A1C - 6.3 on 08/15/2020  Star Rating Drugs: Metformin 530m - [phone]

## 2021-06-04 ENCOUNTER — Telehealth: Payer: Self-pay | Admitting: Pharmacist

## 2021-06-04 NOTE — Chronic Care Management (AMB) (Addendum)
° ° °  Chronic Care Management Pharmacy Assistant   Name: Deborah Wise  MRN: [id number] DOB: 1950/12/30  06/09/2021 APPOINTMENT REMINDER  [name] was reminded to have all medications, supplements and any blood glucose and blood pressure readings available for review with Jeni Salles, Pharm. D, at her telephone visit on 06/09/2021 at 3:00.   Questions: Have you had any recent office visit or specialist visit outside of [facility]? Patient denies any visits outside of [facility].  Are there any concerns you would like to discuss during your office visit? Patient denies any concerns at this time.  Are you having any problems obtaining your medications? (Whether it pharmacy issues or cost) Patient denies any issues obtaining medications.  If patient has any PAP medications ask if they are having any problems getting their PAP medication or refill? Patient denies getting any medications via PAP.   Care Gaps: AWV - message previously sent to Ramond Craver.  Ophthalmology exam - never done Zoster vaccines [facility]) - never done DEXA SCAN -  never done Urine microalbumin - overdue  Foot exam - overdue  Covid-19 vaccine -  overdue Influenza vaccine - overdue Last BP 170/82 on 02/26/2021 Last A1C - 6.3 on 08/15/2020  Star Rating Drug: Metformin 500mg  - last filled on 05/07/2021 30DS at [facility]  Any gaps in medications fill history?  No  Gennie Alma [facility]  Catering manager [phone]

## 2021-06-09 ENCOUNTER — Telehealth: Payer: Medicare Other

## 2021-06-09 NOTE — Progress Notes (Deleted)
Chronic Care Management Pharmacy Note  06/09/2021 Name:  ALAISA MOFFITT MRN:  [id number] DOB:  Mar 10, 1951  Summary: Pt has not been adherent to medications lately due to busier schedule BP is not at goal < 130/80 per home or office readings   Recommendations/Changes made from today's visit: -Recommended for pt to bring wrist cuff to appt on Monday with vascular to compare readings -Educated on the importance of taking medications daily to prevent heart attacks/strokes -Pt will restart taking medications consistently and her daughter will call for reminders   Plan: Adherence and BP assessment in 2-3 weeks  Subjective: Belvia L Mayor is an 71 y.o. year old female who is a primary patient of Isaac Bliss, Rayford Halsted, MD.  The CCM team was consulted for assistance with disease management and care coordination needs.    Engaged with patient by telephone for follow up visit in response to provider referral for pharmacy case management and/or care coordination services.   Consent to Services:  The patient was given information about Chronic Care Management services, agreed to services, and gave verbal consent prior to initiation of services.  Please see initial visit note for detailed documentation.   Patient Care Team: Isaac Bliss, Rayford Halsted, MD as PCP - General (Internal Medicine) Viona Gilmore, [facility] as Pharmacist (Pharmacist)  Recent office visits: 08/15/2020 [name], MD Internal Medicine. Presented for pre-operative clearance. Prescribed ferrous sulfate 235 mg: one tablet at breakfast and at bedtime.  Recent consult visits: 02/26/21 Frann Rider NP (Neurology) - seen for peroneal neuropathy. No medication changes. Follow up in 6 months.    02/16/21 Laurence Slate PA-C ( [facility]) - seen for bilateral extracranial carotid artery stenosis. No medication changes. Follow up in 1 year.   01/22/21 Hetty Ely, RN: Patient presented  for EGD.  01/15/21 Roosevelt Locks NP ([place]) - presented to clinic for US Abdomen Limited due to cirrhosis of liver. No medication changes. Schedule follow up upper endoscopy.  Follow up in 6 months.  12/29/20 Sherlene Shams MD (Obstetrics and Gynecology) - presented to clinic for follow up on bladder outlet obstruction and other issues. No medication changes and follow up in 6 months.  Hospital visits: 01/19/21 Patient presented to [facility] cyst[facility].  Medication Reconciliation was completed by comparing discharge summary, patients EMR and Pharmacy list, and upon discussion with patient.   Admitted to the hospital on 10/20/2020 due to Multiple Dental extractions . Discharge date was 05.16.2022. Discharged from [place]?Medications Started at [facility] Discharge:?? -started the following medication: Amoxicillin 500 mg oral 3 times a day Oxycodone 5 mg ( medication held until 05.25.2022)   Medication Changes at Hospital Discharge: -Changed None   Medications Discontinued at Hospital Discharge: -Stopped  Meloxicam 15 mg Phenazopyridine 95 mg      Medications that remain the same after Hospital Discharge:??  -All other medications will remain the same.   Objective:  Lab Results  Component Value Date   CREATININE 0.76 01/19/2021   BUN 13 01/19/2021   GFR 85.84 08/15/2020   GFRNONAA >60 01/19/2021   GFRAA 84 03/18/2020   NA 138 01/19/2021   K 4.8 01/19/2021   CALCIUM 9.6 01/19/2021   CO2 24 01/19/2021   GLUCOSE 124 (H) 01/19/2021    Lab Results  Component Value Date/Time   HGBA1C 6.3 08/15/2020 08:12 AM   HGBA1C 6.1 (A) 07/17/2020 02:19 PM  HGBA1C 5.5 02/20/2020 08:22 AM   GFR 85.84 08/15/2020 08:12 AM   GFR 86.68 09/07/2018 01:21 PM   MICROALBUR 52.8 (H) 06/22/2018 09:01 AM    Last diabetic Eye exam: No results found for: HMDIABEYEEXA  Last diabetic Foot exam: No results found for:  HMDIABFOOTEX   Lab Results  Component Value Date   CHOL 147 08/15/2020   HDL 61.60 08/15/2020   LDLCALC 64 08/15/2020   TRIG 108.0 08/15/2020   CHOLHDL 2 08/15/2020    Hepatic Function Latest Ref Rng & Units 01/19/2021 08/15/2020 02/14/2019  Total Protein 6.5 - 8.1 g/dL 7.9 7.7 7.1  Albumin 3.5 - 5.0 g/dL 4.2 4.2 4.4  AST 15 - 41 U/L 143(H) 37 38  ALT 0 - 44 U/L 103(H) 28 32  Alk Phosphatase 38 - 126 U/L 85 79 100  Total Bilirubin 0.3 - 1.2 mg/dL 1.3(H) 1.1 1.1  Bilirubin, Direct 0.00 - 0.40 mg/dL - - 0.53(H)    Lab Results  Component Value Date/Time   TSH 3.314 06/28/2016 07:24 AM   TSH 2.038 07/21/2013 07:20 AM    CBC Latest Ref Rng & Units 01/19/2021 10/20/2020 08/15/2020  WBC 4.0 - 10.5 K/uL 4.8 7.2 6.6  Hemoglobin 12.0 - 15.0 g/dL 12.4 11.9(L) 11.5(L)  Hematocrit 36.0 - 46.0 % 38.8 37.9 35.6(L)  Platelets 150 - 400 K/uL 133(L) 129(L) 139.0(L)    No results found for: VD25OH  Clinical ASCVD: Yes  The ASCVD Risk score (Arnett DK, et al., 2019) failed to calculate for the following reasons:   The patient has a prior MI or stroke diagnosis    Depression screen [facility] 2/9 01/03/2020 12/22/2018 08/03/2018  Decreased Interest 0 0 0  Down, Depressed, Hopeless 1 1 0  PHQ - 2 Score 1 1 0  Altered sleeping 1 2 1   Tired, decreased energy 1 2 0  Change in appetite 1 2 0  Feeling bad or failure about yourself  0 0 0  Trouble concentrating - 0 0  Moving slowly or fidgety/restless 0 0 0  Suicidal thoughts 0 0 0  PHQ-9 Score 4 7 1   Difficult doing work/chores Not difficult at all Not difficult at all Not difficult at all  Some recent data might be hidden      Social History   Tobacco Use  Smoking Status Never  Smokeless Tobacco Never   BP Readings from Last 3 Encounters:  02/26/21 (!) 170/82  02/16/21 (!) 177/88  02/02/21 (!) 186/87   Pulse Readings from Last 3 Encounters:  02/26/21 88  02/16/21 100  02/02/21 84   Wt Readings from Last 3 Encounters:  02/26/21 168 lb 4  oz (76.3 kg)  02/16/21 167 lb (75.8 kg)  01/22/21 167 lb (75.8 kg)   BMI Readings from Last 3 Encounters:  02/26/21 28.88 kg/m  02/16/21 28.67 kg/m  01/22/21 28.67 kg/m    Assessment/Interventions: Review of patient past medical history, allergies, medications, health status, including review of consultants reports, laboratory and other test data, was performed as part of comprehensive evaluation and provision of chronic care management services.   SDOH:  (Social Determinants of Health) assessments and interventions performed: No  SDOH Screenings   Alcohol Screen: Not on file  Depression (JME2-6): Not on file  Financial Resource Strain: Not on file  Food Insecurity: Not on file  Housing: Not on file  Physical Activity: Not on file  Social Connections: Not on file  Stress: Not on file  Tobacco Use: Low Risk  Smoking Tobacco Use: Never   Smokeless Tobacco Use: Never   Passive Exposure: Not on file  Transportation Needs: Not on file    [place]  No Known Allergies  Medications Reviewed Today     Reviewed by Frann Rider, NP (Nurse Practitioner) on 02/26/21 at 1447  Med List Status: <None>   Medication Order Taking? Sig Documenting Provider Last Dose Status Informant  Accu-Chek FastClix Lancets MISC [id number] Yes 1 each by Does not apply route daily. Isaac Bliss, Rayford Halsted, MD Taking Active Self  ACCU-CHEK GUIDE test strip [id number] Yes USE TO test AT [facility] AND AT BEDTIME Isaac Bliss, Rayford Halsted, MD Taking Active   Accu-Chek Softclix Lancets lancets [id number] Yes USE AS DIRECTED TO CHECK BLOOD SUGAR TWICE A DAY Isaac Bliss, Rayford Halsted, MD Taking Active Self  amLODipine (NORVASC) 10 MG tablet [id number] Yes TAKE ONE TABLET BY MOUTH EVERY MORNING FOR BLOOD PRESSURE  Patient taking differently: Take 10 mg by mouth daily.   Isaac Bliss, Rayford Halsted, MD Taking Active Self  ASPIRIN LOW DOSE 81 MG EC tablet [id number] Yes TAKE 1 TABLET BY MOUTH EVERY DAY   Patient taking differently: Take 81 mg by mouth daily.   Isaac Bliss, Rayford Halsted, MD Taking Active   Blood Glucose Monitoring Suppl (ACCU-CHEK GUIDE) w/Device Drucie Opitz [id number] Yes Please specify directions, refills and quantity Isaac Bliss, Rayford Halsted, MD Taking Active Self  carboxymethylcellulose (REFRESH PLUS) 0.5 % SOLN [id number] Yes Place 1 drop into both eyes daily. [provider] Taking Active Self  Catheters (SELF-CATH SYSTEM STRAIGHT TIP) KIT [id number] Yes 1 each by Does not apply route as needed. [name], MD Taking Active Self  chlorhexidine (PERIDEX) 0.12 % solution [id number] Yes Use as directed 15 mLs in the mouth or throat 2 (two) times daily. [provider] Taking Active Self  Cholecalciferol 25 MCG (1000 UT) capsule [id number] Yes Take 1,000 Units by mouth daily.  [provider] Taking Active Self  Continuous Blood Gluc Receiver (FREESTYLE LIBRE 2 READER) DEVI [id number] Yes 1 each by Does not apply route daily. Isaac Bliss, Rayford Halsted, MD Taking Active Self  Continuous Blood Gluc Sensor (FREESTYLE LIBRE 2 SENSOR) [place] [id number] Yes 1 each by Does not apply route daily. Isaac Bliss, Rayford Halsted, MD Taking Active Self  cyanocobalamin 1000 MCG tablet [id number] Yes Take 1,000 mcg by mouth daily.  [provider] Taking Active Self  estradiol (ESTRACE) 0.1 MG/GM vaginal cream [id number] Yes Place 0.5 g vaginally 2 (two) times a week. Place 0.5g nightly for two weeks then twice a week after [name], MD Taking Active Self           Med Note Linus Mako, DENISE P   Wed Jan 14, 2021  4:52 PM) Per pt uses as needed  ferrous sulfate 325 (65 FE) MG EC tablet [id number] Yes Take 1 tablet (325 mg total) by mouth in the morning and at bedtime.  Patient taking differently: Take 325 mg by mouth in the morning and at bedtime.   Isaac Bliss, Rayford Halsted, MD Taking Active   hydrochlorothiazide (HYDRODIURIL) 25 MG tablet [id number] Yes  TAKE ONE TABLET BY MOUTH EVERY MORNING FOR fluid AND FOR BLOOD PRESSURE  Patient taking differently: Take 25 mg by mouth daily.   Isaac Bliss, Rayford Halsted, MD Taking Active Self  lidocaine (XYLOCAINE) 2 % jelly [id number] Yes Apply 1 application topically as needed.  Patient taking differently: Apply 1 application topically as needed (pain).   Sherlene Shams  N, MD Taking Active   metFORMIN (GLUCOPHAGE) 500 MG tablet [id number] Yes TAKE ONE TABLET BY MOUTH EVERY MORNING FOR diabetes  Patient taking differently: Take 500 mg by mouth daily with breakfast.   Isaac Bliss, Rayford Halsted, MD Taking Active Self  omeprazole (PRILOSEC) 40 MG capsule [id number]  Take 1 capsule (40 mg total) by mouth daily for 14 days. Irene Shipper, MD  Expired 02/16/21 1941   REPATHA SURECLICK 740 MG/ML Darden Palmer [id number] Yes INJECT 149m into THE SKIN every 14 DAYS FOR cholesterol  Patient taking differently: Inject 140 mg into the skin every 14 (fourteen) days.   MFrann Rider NP Taking Active   triamcinolone cream (KENALOG) 0.1 % [id number] APPLY TO THE AFFECTED AREA(S) TWICE DAILY FOR SKIN condition  Patient taking differently: Apply 1 application topically 2 (two) times daily as needed (Dry skin).   HIsaac Bliss ERayford Halsted MD Taking Active   Trospium Chloride 60 MG CP24 [id number] Take 1 capsule (60 mg total) by mouth daily. SJaquita Folds MD Taking Active             Patient Active Problem List   Diagnosis Date Noted   Chronic hepatitis C (HM[place]i 07/02/2020   Personal history of colonic polyps 07/02/2020   Fracture of metacarpal bone 05/15/2020   Peripheral arterial disease ([place] 04/15/2020   Coronary artery disease    Chronic diastolic CHF (congestive heart failure) ([place] 03/12/2020   Dysuria 03/01/2020   Chronic arthropathy 11/07/2019   Hallux limitus of right foot 11/07/2019   Diabetic neuropathy ([place] 04/04/2019   Cirrhosis of liver (HVenturia 06/22/2018   Chronic hepatitis (HF[place]  06/22/2018   Hyperlipidemia associated with type 2 diabetes mellitus (HA[place] 06/22/2018   Carotid artery disease (HF[place] 06/22/2018   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H[place] 07/02/2016   Aneurysm of middle cerebral artery    Cerebrovascular accident (CVA) due to thrombosis of left vertebral artery (H[place] 06/28/2016   Cocaine abuse (HW[place]    Stroke (cerebrum) ([place] 06/27/2016   Microcytic anemia 12/27/2014   Thrombocytopenia (HH[place] 12/25/2014   Hypoglycemia 07/21/2013   Diabetes mellitus (H[place] 07/21/2013   Malignant hypertension 07/21/2013   Liver disease 07/21/2013   Alcohol abuse 07/21/2013    Immunization History  Administered Date(s) Administered   Influenza, High Dose Seasonal PF 06/22/2018   Influenza,inj,Quad PF,6+ Mos 06/29/2016   Moderna SARS-COV2 Booster Vaccination 05/28/2020   PFIZER(Purple Top)SARS-COV-2 Vaccination 09/03/2019, 10/04/2019   Td 08/03/2017   Patient hasn't been taking her medications consistently and she just restarted taking her medications more consistently now. Patient's daughter is going to call her to remind her to take her medications. Patient just restarted consistently taking her medications last week.  Conditions to be addressed/monitored:  Hypertension, Hyperlipidemia, Diabetes, Overactive Bladder and Cirrhosis, Neuropathy  Conditions addressed this visit: Hypertension, hyperlipidemia***  There are no care plans that you recently modified to display for this patient.   Medication Assistance: None required.  Patient affirms current coverage meets needs.  Compliance/Adherence/Medication fill history: Care Gaps: COVID booster, influenza vaccine, eye exam, shingrix, DEXA, foot exam, urine microalbumin Last BP 170/82 on 02/26/2021 Last A1C - 6.3 on 08/15/2020   Star-Rating Drugs: Metformin 5052m- last filled on 05/07/2021 30DS at [facility]  Patient's preferred pharmacy is:  [facility] [place] [address]  [address] [place]: [phone]: [phone]  [place] [address] Phone: [phone] Fax: [phone]   Uses pill box? No - adherence packaging Pt endorses 50% compliance  We discussed: Benefits of medication synchronization, packaging and delivery as well as enhanced pharmacist oversight with [facility]. Patient decided to: Utilize [facility] pharmacy for medication synchronization, packaging and delivery  Care Plan and Follow Up Patient Decision:  Patient agrees to Care Plan and Follow-up.  Plan: Telephone follow up appointment with care management team member scheduled for:  4 months  Jeni Salles, PharmD [place] Pharmacist [place] at [place] [phone]

## 2021-06-10 ENCOUNTER — Ambulatory Visit (INDEPENDENT_AMBULATORY_CARE_PROVIDER_SITE_OTHER): Payer: Medicare Other | Admitting: Pharmacist

## 2021-06-10 DIAGNOSIS — K7030 Alcoholic cirrhosis of liver without ascites: Secondary | ICD-10-CM

## 2021-06-10 DIAGNOSIS — E1149 Type 2 diabetes mellitus with other diabetic neurological complication: Secondary | ICD-10-CM

## 2021-06-10 DIAGNOSIS — I10 Essential (primary) hypertension: Secondary | ICD-10-CM

## 2021-06-10 NOTE — Progress Notes (Signed)
Chronic Care Management Pharmacy Note  06/16/2021 Name:  DONNI OGLESBY MRN:  [id number] DOB:  [birth year]  Summary: Pt has not been adherent to medications lately BP is not at goal < 130/80 per home or office readings   Recommendations/Changes made from today's visit: -Recommended for pt to bring wrist cuff to appt with Dr. Jerilee Hoh -Educated on the importance of taking medications daily to prevent heart attacks/strokes -Pt will restart taking medications consistently and she will use alarms or mark the calendar for daily medications -Consider switching HCTZ to chlorthalidone for better BP lowering   Plan: Adherence and BP assessment in 2-3 weeks Scheduled PCP follow up in 1 week  Subjective: Marla L Hammersmith is an 71 y.o. year old female who is a primary patient of Isaac Bliss, Rayford Halsted, MD.  The CCM team was consulted for assistance with disease management and care coordination needs.    Engaged with patient by telephone for follow up visit in response to provider referral for pharmacy case management and/or care coordination services.   Consent to Services:  The patient was given information about Chronic Care Management services, agreed to services, and gave verbal consent prior to initiation of services.  Please see initial visit note for detailed documentation.   Patient Care Team: Isaac Bliss, Rayford Halsted, MD as PCP - General (Internal Medicine) Viona Gilmore, [facility] as Pharmacist (Pharmacist)  Recent office visits: 08/15/2020 [name], MD Internal Medicine. Presented for pre-operative clearance. Prescribed ferrous sulfate 235 mg: one tablet at breakfast and at bedtime.  Recent consult visits: 02/26/21 Frann Rider NP (Neurology) - seen for peroneal neuropathy. No medication changes. Follow up in 6 months.    02/16/21 Laurence Slate PA-C ( [facility]) - seen for bilateral extracranial carotid artery stenosis. No medication  changes. Follow up in 1 year.   01/22/21 Hetty Ely, RN: Patient presented for EGD.  01/15/21 Roosevelt Locks NP ([place]) - presented to clinic for US Abdomen Limited due to cirrhosis of liver. No medication changes. Schedule follow up upper endoscopy.  Follow up in 6 months.  12/29/20 Sherlene Shams MD (Obstetrics and Gynecology) - presented to clinic for follow up on bladder outlet obstruction and other issues. No medication changes and follow up in 6 months.  Hospital visits: 01/19/21 Patient presented to [facility] for cystoscopy.  Medication Reconciliation was completed by comparing discharge summary, patients EMR and Pharmacy list, and upon discussion with patient.   Admitted to the hospital on 10/20/2020 due to Multiple Dental extractions . Discharge date was 05.16.2022. Discharged from [facility]?Medications Started at [facility] Discharge:?? -started the following medication: Amoxicillin 500 mg oral 3 times a day Oxycodone 5 mg ( medication held until 05.25.2022)   Medication Changes at Hospital Discharge: -Changed None   Medications Discontinued at Hospital Discharge: -Stopped  Meloxicam 15 mg Phenazopyridine 95 mg      Medications that remain the same after Hospital Discharge:??  -All other medications will remain the same.   Objective:  Lab Results  Component Value Date   CREATININE 0.76 01/19/2021   BUN 13 01/19/2021   GFR 85.84 08/15/2020   GFRNONAA >60 01/19/2021   GFRAA 84 03/18/2020   NA 138 01/19/2021   K 4.8 01/19/2021   CALCIUM 9.6 01/19/2021   CO2 24 01/19/2021   GLUCOSE 124 (H) 01/19/2021    Lab Results  Component Value Date/Time   HGBA1C 6.3  08/15/2020 08:12 AM   HGBA1C 6.1 (A) 07/17/2020 02:19 PM   HGBA1C 5.5 02/20/2020 08:22 AM   GFR 85.84 08/15/2020 08:12 AM   GFR 86.68 09/07/2018 01:21 PM   MICROALBUR 52.8 (H) 06/22/2018 09:01 AM    Last diabetic Eye exam: No  results found for: HMDIABEYEEXA  Last diabetic Foot exam: No results found for: HMDIABFOOTEX   Lab Results  Component Value Date   CHOL 147 08/15/2020   HDL 61.60 08/15/2020   LDLCALC 64 08/15/2020   TRIG 108.0 08/15/2020   CHOLHDL 2 08/15/2020    Hepatic Function Latest Ref Rng & Units 01/19/2021 08/15/2020 02/14/2019  Total Protein 6.5 - 8.1 g/dL 7.9 7.7 7.1  Albumin 3.5 - 5.0 g/dL 4.2 4.2 4.4  AST 15 - 41 U/L 143(H) 37 38  ALT 0 - 44 U/L 103(H) 28 32  Alk Phosphatase 38 - 126 U/L 85 79 100  Total Bilirubin 0.3 - 1.2 mg/dL 1.3(H) 1.1 1.1  Bilirubin, Direct 0.00 - 0.40 mg/dL - - 0.53(H)    Lab Results  Component Value Date/Time   TSH 3.314 06/28/2016 07:24 AM   TSH 2.038 07/21/2013 07:20 AM    CBC Latest Ref Rng & Units 01/19/2021 10/20/2020 08/15/2020  WBC 4.0 - 10.5 K/uL 4.8 7.2 6.6  Hemoglobin 12.0 - 15.0 g/dL 12.4 11.9(L) 11.5(L)  Hematocrit 36.0 - 46.0 % 38.8 37.9 35.6(L)  Platelets 150 - 400 K/uL 133(L) 129(L) 139.0(L)    No results found for: VD25OH  Clinical ASCVD: Yes  The ASCVD Risk score (Arnett DK, et al., 2019) failed to calculate for the following reasons:   The patient has a prior MI or stroke diagnosis    Depression screen [facility] 2/9 01/03/2020 12/22/2018 08/03/2018  Decreased Interest 0 0 0  Down, Depressed, Hopeless 1 1 0  PHQ - 2 Score 1 1 0  Altered sleeping 1 2 1   Tired, decreased energy 1 2 0  Change in appetite 1 2 0  Feeling bad or failure about yourself  0 0 0  Trouble concentrating - 0 0  Moving slowly or fidgety/restless 0 0 0  Suicidal thoughts 0 0 0  PHQ-9 Score 4 7 1   Difficult doing work/chores Not difficult at all Not difficult at all Not difficult at all  Some recent data might be hidden      Social History   Tobacco Use  Smoking Status Never  Smokeless Tobacco Never   BP Readings from Last 3 Encounters:  02/26/21 (!) 170/82  02/16/21 (!) 177/88  02/02/21 (!) 186/87   Pulse Readings from Last 3 Encounters:  02/26/21 88   02/16/21 100  02/02/21 84   Wt Readings from Last 3 Encounters:  02/26/21 168 lb 4 oz (76.3 kg)  02/16/21 167 lb (75.8 kg)  01/22/21 167 lb (75.8 kg)   BMI Readings from Last 3 Encounters:  02/26/21 28.88 kg/m  02/16/21 28.67 kg/m  01/22/21 28.67 kg/m    Assessment/Interventions: Review of patient past medical history, allergies, medications, health status, including review of consultants reports, laboratory and other test data, was performed as part of comprehensive evaluation and provision of chronic care management services.   SDOH:  (Social Determinants of Health) assessments and interventions performed: No  SDOH Screenings   Alcohol Screen: Not on file  Depression (YBO1-7): Not on file  Financial Resource Strain: Not on file  Food Insecurity: Not on file  Housing: Not on file  Physical Activity: Not on file  Social Connections: Not on file  Stress: Not on file  Tobacco Use: Low Risk    Smoking Tobacco Use: Never   Smokeless Tobacco Use: Never   Passive Exposure: Not on file  Transportation Needs: Not on file    [place]  No Known Allergies  Medications Reviewed Today     Reviewed by Frann Rider, NP (Nurse Practitioner) on 02/26/21 at 1447  Med List Status: <None>   Medication Order Taking? Sig Documenting Provider Last Dose Status Informant  Accu-Chek FastClix Lancets MISC [id number] Yes 1 each by Does not apply route daily. Isaac Bliss, Rayford Halsted, MD Taking Active Self  ACCU-CHEK GUIDE test strip [id number] Yes USE TO test AT [facility] AND AT BEDTIME Isaac Bliss, Rayford Halsted, MD Taking Active   Accu-Chek Softclix Lancets lancets [id number] Yes USE AS DIRECTED TO CHECK BLOOD SUGAR TWICE A DAY Isaac Bliss, Rayford Halsted, MD Taking Active Self  amLODipine (NORVASC) 10 MG tablet [id number] Yes TAKE ONE TABLET BY MOUTH EVERY MORNING FOR BLOOD PRESSURE  Patient taking differently: Take 10 mg by mouth daily.   Isaac Bliss, Rayford Halsted, MD Taking Active  Self  ASPIRIN LOW DOSE 81 MG EC tablet [id number] Yes TAKE 1 TABLET BY MOUTH EVERY DAY  Patient taking differently: Take 81 mg by mouth daily.   Isaac Bliss, Rayford Halsted, MD Taking Active   Blood Glucose Monitoring Suppl (ACCU-CHEK GUIDE) w/Device Drucie Opitz [id number] Yes Please specify directions, refills and quantity Isaac Bliss, Rayford Halsted, MD Taking Active Self  carboxymethylcellulose (REFRESH PLUS) 0.5 % SOLN [id number] Yes Place 1 drop into both eyes daily. [provider] Taking Active Self  Catheters (SELF-CATH SYSTEM STRAIGHT TIP) KIT [id number] Yes 1 each by Does not apply route as needed. [name], MD Taking Active Self  chlorhexidine (PERIDEX) 0.12 % solution [id number] Yes Use as directed 15 mLs in the mouth or throat 2 (two) times daily. [provider] Taking Active Self  Cholecalciferol 25 MCG (1000 UT) capsule [id number] Yes Take 1,000 Units by mouth daily.  [provider] Taking Active Self  Continuous Blood Gluc Receiver (FREESTYLE LIBRE 2 READER) DEVI [id number] Yes 1 each by Does not apply route daily. Isaac Bliss, Rayford Halsted, MD Taking Active Self  Continuous Blood Gluc Sensor (FREESTYLE LIBRE 2 SENSOR) [place] [id number] Yes 1 each by Does not apply route daily. Isaac Bliss, Rayford Halsted, MD Taking Active Self  cyanocobalamin 1000 MCG tablet [id number] Yes Take 1,000 mcg by mouth daily.  [provider] Taking Active Self  estradiol (ESTRACE) 0.1 MG/GM vaginal cream [id number] Yes Place 0.5 g vaginally 2 (two) times a week. Place 0.5g nightly for two weeks then twice a week after [name], MD Taking Active Self           Med Note Linus Mako, DENISE P   Wed Jan 14, 2021  4:52 PM) Per pt uses as needed  ferrous sulfate 325 (65 FE) MG EC tablet [id number] Yes Take 1 tablet (325 mg total) by mouth in the morning and at bedtime.  Patient taking differently: Take 325 mg by mouth in the morning and at bedtime.   Isaac Bliss,  Rayford Halsted, MD Taking Active   hydrochlorothiazide (HYDRODIURIL) 25 MG tablet [id number] Yes TAKE ONE TABLET BY MOUTH EVERY MORNING FOR fluid AND FOR BLOOD PRESSURE  Patient taking differently: Take 25 mg by mouth daily.   Isaac Bliss, Rayford Halsted, MD Taking Active Self  lidocaine (XYLOCAINE) 2 % jelly [id number] Yes Apply 1 application topically as needed.  Patient taking  differently: Apply 1 application topically as needed (pain).   [name], MD Taking Active   metFORMIN (GLUCOPHAGE) 500 MG tablet [id number] Yes TAKE ONE TABLET BY MOUTH EVERY MORNING FOR diabetes  Patient taking differently: Take 500 mg by mouth daily with breakfast.   Isaac Bliss, Rayford Halsted, MD Taking Active Self  omeprazole (PRILOSEC) 40 MG capsule [id number]  Take 1 capsule (40 mg total) by mouth daily for 14 days. Irene Shipper, MD  Expired 02/16/21 [id number]   REPATHA SURECLICK 132 MG/ML Darden Palmer [id number] Yes INJECT 137m into THE SKIN every 14 DAYS FOR cholesterol  Patient taking differently: Inject 140 mg into the skin every 14 (fourteen) days.   MFrann Rider NP Taking Active   triamcinolone cream (KENALOG) 0.1 % [id number] APPLY TO THE AFFECTED AREA(S) TWICE DAILY FOR SKIN condition  Patient taking differently: Apply 1 application topically 2 (two) times daily as needed (Dry skin).   HIsaac Bliss ERayford Halsted MD Taking Active   Trospium Chloride 60 MG CP24 [id number] Take 1 capsule (60 mg total) by mouth daily. SJaquita Folds MD Taking Active             Patient Active Problem List   Diagnosis Date Noted   Chronic hepatitis C ([place] 07/02/2020   Personal history of colonic polyps 07/02/2020   Fracture of metacarpal bone 05/15/2020   Peripheral arterial disease (HP[place] 04/15/2020   Coronary artery disease    Chronic diastolic CHF (congestive heart failure) ([place] 03/12/2020   Dysuria 03/01/2020   Chronic arthropathy 11/07/2019   Hallux limitus of right foot 11/07/2019   Diabetic  neuropathy (HL[place] 04/04/2019   Cirrhosis of liver ([place] 06/22/2018   Chronic hepatitis (HD[place] 06/22/2018   Hyperlipidemia associated with type 2 diabetes mellitus (HGerald[place] 06/22/2018   Carotid artery disease (H[place] 06/22/2018   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HOd[place] 07/02/2016   Aneurysm of middle cerebral artery    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 12/25/2014   Hypoglycemia 07/21/2013   Diabetes mellitus (HCop[place] 07/21/2013   Malignant hypertension 07/21/2013   Liver disease 07/21/2013   Alcohol abuse 07/21/2013    Immunization History  Administered Date(s) Administered   Influenza, High Dose Seasonal PF 06/22/2018   Influenza,inj,Quad PF,6+ Mos 06/29/2016   Moderna SARS-COV2 Booster Vaccination 05/28/2020   PFIZER(Purple Top)SARS-COV-2 Vaccination 09/03/2019, 10/04/2019   Td 08/03/2017   Patient hasn't been taking her medications consistently and again she she got behind on them. She isn't sure using an alarm would be helpful but she will try. She also liked the idea of marking a calendar for the dates that she took her medication as she uses a calendar already to remember to take the [place] She thinks her husband can also help with accountability. Patient reports that lunch time is the hardest time of day to remember.   Conditions to be addressed/monitored:  Hypertension, Hyperlipidemia, Diabetes, Overactive Bladder and Cirrhosis, Neuropathy  Conditions addressed this visit: Hypertension, hyperlipidemia  Care Plan : CCM Pharmacy Care Plan  Updates made by PViona Gilmore RBovillsince 06/16/2021 12:00 AM     Problem: Problem: Hypertension, Hyperlipidemia, Diabetes, Overactive Bladder and Cirrhosis, Neuropathy      Long-Range Goal: Patient-Specific Goal   Start Date: 10/17/2020  Expected End Date:  10/17/2021  Recent Progress: On track  Priority: High  Note:  Current Barriers:  Unable to independently monitor therapeutic efficacy Unable to achieve control of blood pressure   Pharmacist Clinical Goal(s):  Patient will achieve adherence to monitoring guidelines and medication adherence to achieve therapeutic efficacy achieve control of blood pressure as evidenced by home readings  through collaboration with PharmD and provider.   Interventions: 1:1 collaboration with Isaac Bliss, Rayford Halsted, MD regarding development and update of comprehensive plan of care as evidenced by provider attestation and co-signature Inter-disciplinary care team collaboration (see longitudinal plan of care) Comprehensive medication review performed; medication list updated in electronic medical record  Hypertension (BP goal <130/80) -Uncontrolled -Current treatment: Amlodipine 10 mg daily - appropriate, query effective Hydrochlorothiazide 25 mg daily - appropriate, query effective Lisinopril 20 mg daily - appropriate, query effective -Medications previously tried: hydralazine -Current home readings: 160; once a week (wrist cuff) -Current dietary habits: pays attention to sodium content on package labels; uses garlic powder and onion powder and Mrs. Dash more than salt -Current exercise habits: more active lately on the treadmill and walking in stores -Denies hypotensive/hypertensive symptoms -Educated on Daily salt intake goal < 2300 mg; Exercise goal of 150 minutes per week; Importance of home blood pressure monitoring; Proper BP monitoring technique; -Counseled to monitor BP at home weekly, document, and provide log at future appointments -Counseled on diet and exercise extensively Recommended to continue current medication  Hyperlipidemia/history of stroke: (LDL goal < 70) -Controlled -Current treatment: Aspirin 81 mg daily - appropriate, effective, safe, accessible Repatha 140 mg every 14  days - get its free (uses a calendar) -  appropriate, effective, safe, accessible -Medications previously tried: atorvastatin (cirrhosis)  -Current dietary patterns: did not discuss -Current exercise habits: more active lately -Educated on Cholesterol goals;  Benefits of statin for ASCVD risk reduction; Importance of limiting foods high in cholesterol; -Counseled on diet and exercise extensively Recommended to continue current medication -Counseled on the importance of taking her medications consistently to prevent furture strokes/heart attacks  Diabetes (A1c goal <7%) -Controlled -Current medications: Metformin 500 mg 1 tablet daily - appropriate, effective, safe, accessible -Medications previously tried: none  -Current home glucose readings fasting glucose: not using CGM - unsure of current readings post prandial glucose: n/a -Denies hypoglycemic/hyperglycemic symptoms -Current meal patterns:  breakfast: did not discuss lunch: did not discuss  dinner: did not discuss snacks: did not discuss drinks: did not discuss -Current exercise: did not discuss -Educated on A1c and blood sugar goals; Benefits of routine self-monitoring of blood sugar; Continuous glucose monitoring; Carbohydrate counting and/or plate method -Counseled to check feet daily and get yearly eye exams -Counseled on diet and exercise extensively Recommended to continue current medication  Chronic pain (Goal: minimize symptoms of pain) -Not ideally controlled -Current treatment  Meloxicam 7.5 mg daily PRN (1-2x weekly)  - appropriate, effective, safe, accessible Meloxicam 15 mg daily PRN (Not taking) Oxycodone 4 mg PRN (Not taking)  -Medications previously tried: n/a  -Counseled on  avoiding taking meloxicam doses together and to take with plenty of water and with a snack/meal due to DDI with aspirin.  Overactive bladder (Goal: minimize symptoms) -Not ideally controlled -Current treatment  Trospium daily -  appropriate, effective, safe, accessible Tamsulosin 0.4 mg daily - appropriate, effective, safe, accessible -Medications previously tried: none  -Recommended to continue current medication  Health Maintenance -Vaccine gaps: pneumonia, shingles, COVID booster -Current therapy:  Vitamin D 2000 units daily  Vitamin B12 1000 mcg daily  -Educated on Cost vs benefit of each product must be carefully weighed  by individual consumer -Patient is satisfied with current therapy and denies issues -Recommended to continue current medication  Patient Goals/Self-Care Activities Patient will:  - take medications as prescribed focus on medication adherence by setting an alarm check glucose a few times a week, document, and provide at future appointments check blood pressure a few times a week, document, and provide at future appointments target a minimum of 150 minutes of moderate intensity exercise weekly  Follow Up Plan: Telephone follow up appointment with care management team member scheduled for: 4 months      Medication Assistance: None required.  Patient affirms current coverage meets needs.  Compliance/Adherence/Medication fill history: Care Gaps: COVID booster, influenza vaccine, eye exam, shingrix, DEXA, foot exam, urine microalbumin Last BP 170/82 on 02/26/2021 Last A1C - 6.3 on 08/15/2020   [facility]: Metformin 512m - last filled on 05/07/2021 30DS at [facility]  [facility]  [facility] [place] [address] [place]Phone: [phone]: [phone] [facility] [place] [address]Phone: [phone]: [phone]  Uses pill box? No - adherence packaging Pt endorses 50% compliance  We discussed: Benefits of medication synchronization, packaging and delivery as well as  enhanced pharmacist oversight with [facility]. Patient decided to: Utilize [facility] pharmacy for medication synchronization, packaging and delivery  Care Plan and Follow Up Patient Decision:  Patient agrees to Care Plan and Follow-up.  Plan: Telephone follow up appointment with care management team member scheduled for:  4 months  MJeni Salles PharmD BAmagansettPharmacist LBanderaat [phone]

## 2021-06-12 DIAGNOSIS — H4322 Crystalline deposits in vitreous body, left eye: Secondary | ICD-10-CM | POA: Diagnosis not present

## 2021-06-12 DIAGNOSIS — H0102A Squamous blepharitis right eye, upper and lower eyelids: Secondary | ICD-10-CM | POA: Diagnosis not present

## 2021-06-12 DIAGNOSIS — H43392 Other vitreous opacities, left eye: Secondary | ICD-10-CM | POA: Diagnosis not present

## 2021-06-12 DIAGNOSIS — H0102B Squamous blepharitis left eye, upper and lower eyelids: Secondary | ICD-10-CM | POA: Diagnosis not present

## 2021-06-12 DIAGNOSIS — H40033 Anatomical narrow angle, bilateral: Secondary | ICD-10-CM | POA: Diagnosis not present

## 2021-06-12 DIAGNOSIS — H2513 Age-related nuclear cataract, bilateral: Secondary | ICD-10-CM | POA: Diagnosis not present

## 2021-06-16 ENCOUNTER — Other Ambulatory Visit: Payer: Self-pay | Admitting: Internal Medicine

## 2021-06-18 ENCOUNTER — Encounter: Payer: Self-pay | Admitting: Internal Medicine

## 2021-06-18 ENCOUNTER — Ambulatory Visit (INDEPENDENT_AMBULATORY_CARE_PROVIDER_SITE_OTHER): Payer: Commercial Managed Care - HMO | Admitting: Internal Medicine

## 2021-06-18 VITALS — BP 128/70 | HR 84 | Temp 97.6°F | Ht 64.0 in | Wt 170.1 lb

## 2021-06-18 DIAGNOSIS — I5032 Chronic diastolic (congestive) heart failure: Secondary | ICD-10-CM

## 2021-06-18 DIAGNOSIS — I2510 Atherosclerotic heart disease of native coronary artery without angina pectoris: Secondary | ICD-10-CM | POA: Diagnosis not present

## 2021-06-18 DIAGNOSIS — K7030 Alcoholic cirrhosis of liver without ascites: Secondary | ICD-10-CM

## 2021-06-18 DIAGNOSIS — Z13820 Encounter for screening for osteoporosis: Secondary | ICD-10-CM

## 2021-06-18 DIAGNOSIS — I10 Essential (primary) hypertension: Secondary | ICD-10-CM

## 2021-06-18 DIAGNOSIS — E785 Hyperlipidemia, unspecified: Secondary | ICD-10-CM

## 2021-06-18 DIAGNOSIS — E1169 Type 2 diabetes mellitus with other specified complication: Secondary | ICD-10-CM

## 2021-06-18 DIAGNOSIS — E1149 Type 2 diabetes mellitus with other diabetic neurological complication: Secondary | ICD-10-CM | POA: Diagnosis not present

## 2021-06-18 DIAGNOSIS — Z23 Encounter for immunization: Secondary | ICD-10-CM | POA: Diagnosis not present

## 2021-06-18 LAB — CBC WITH DIFFERENTIAL/PLATELET
Basophils Absolute: 0 10*3/uL (ref 0.0–0.1)
Basophils Relative: 0.4 % (ref 0.0–3.0)
Eosinophils Absolute: 0 10*3/uL (ref 0.0–0.7)
Eosinophils Relative: 0.7 % (ref 0.0–5.0)
HCT: 37.3 % (ref 36.0–46.0)
Hemoglobin: 11.8 g/dL — ABNORMAL LOW (ref 12.0–15.0)
Lymphocytes Relative: 38.6 % (ref 12.0–46.0)
Lymphs Abs: 2.4 10*3/uL (ref 0.7–4.0)
MCHC: 31.6 g/dL (ref 30.0–36.0)
MCV: 74.4 fl — ABNORMAL LOW (ref 78.0–100.0)
Monocytes Absolute: 0.6 10*3/uL (ref 0.1–1.0)
Monocytes Relative: 10 % (ref 3.0–12.0)
Neutro Abs: 3.2 10*3/uL (ref 1.4–7.7)
Neutrophils Relative %: 50.3 % (ref 43.0–77.0)
Platelets: 131 10*3/uL — ABNORMAL LOW (ref 150.0–400.0)
RBC: 5.02 Mil/uL (ref 3.87–5.11)
RDW: 16.6 % — ABNORMAL HIGH (ref 11.5–15.5)
WBC: 6.3 10*3/uL (ref 4.0–10.5)

## 2021-06-18 LAB — COMPREHENSIVE METABOLIC PANEL
ALT: 29 U/L (ref 0–35)
AST: 40 U/L — ABNORMAL HIGH (ref 0–37)
Albumin: 4.4 g/dL (ref 3.5–5.2)
Alkaline Phosphatase: 92 U/L (ref 39–117)
BUN: 17 mg/dL (ref 6–23)
CO2: 28 mEq/L (ref 19–32)
Calcium: 10.4 mg/dL (ref 8.4–10.5)
Chloride: 101 mEq/L (ref 96–112)
Creatinine, Ser: 0.8 mg/dL (ref 0.40–1.20)
GFR: 75.2 mL/min (ref >60.00)
Glucose, Bld: 120 mg/dL — ABNORMAL HIGH (ref 70–99)
Potassium: 3.5 mEq/L (ref 3.5–5.1)
Sodium: 138 mEq/L (ref 135–145)
Total Bilirubin: 1.1 mg/dL (ref 0.2–1.2)
Total Protein: 8 g/dL (ref 6.0–8.3)

## 2021-06-18 LAB — LIPID PANEL
Cholesterol: 160 mg/dL (ref 0–200)
HDL: 69.6 mg/dL (ref >39.00)
LDL Cholesterol: 75 mg/dL (ref 0–99)
NonHDL: 90.49
Total CHOL/HDL Ratio: 2
Triglycerides: 79 mg/dL (ref 0.0–149.0)
VLDL: 15.8 mg/dL (ref 0.0–40.0)

## 2021-06-18 LAB — HEMOGLOBIN A1C: Hgb A1c MFr Bld: 6.2 % (ref 4.6–6.5)

## 2021-06-18 NOTE — Patient Instructions (Signed)
-  Nice seeing you today!!  -Lab work today; will notify you once results are available.  -Flu and pneumonia vaccines today.  -Remember your COVID booster and shingles vaccines at the pharmacy.  -Schedule follow up in 3-4 months.

## 2021-06-18 NOTE — Progress Notes (Signed)
Established Patient Office Visit     This visit occurred during the SARS-CoV-2 public health emergency.  Safety protocols were in place, including screening questions prior to the visit, additional usage of staff PPE, and extensive cleaning of exam room while observing appropriate contact time as indicated for disinfecting solutions.    CC/Reason for Visit: Follow-up chronic medical conditions  HPI: Deborah Wise is a 71 y.o. female who is coming in today for the above mentioned reasons. Past Medical History is significant for: Coronary artery disease followed by cardiology with a recent catheterization in October 2021 that showed a proximal RCA occlusion of 75%, distal RCA 50% and mid LAD 60%.  Medical management was recommended.  She has a history of carotid artery disease and follows on a yearly basis with vascular surgery. Was seen by them recently and 12 month follow up US was recommended due to stability. 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  She has some mild thrombocytopenia due to splenomegaly from this. She also has PAD.  She is overdue for flu, pneumonia, COVID booster, shingles vaccines.  Colonoscopy and mammogram are up-to-date, she is due for a bone density test.  She has not seen her cardiologist in over a year.   Past Medical/Surgical History: Past Medical History:  Diagnosis Date   Bilateral carotid artery stenosis    vascular-- Dagoberto Ligas PA --- last duplex in epic 10-06-2020 right ICA 60-79% and left ICA 40-59%   Bladder outlet obstruction    self caths   Bladder wall thickening    Blood transfusion without reported diagnosis    Coronary artery disease     cardiologist--- dr Gwenlyn Found---- cath 03-25-2020 showed proxRCA 75%, distal RCA 50%, and midLAD 60%,  managed medically   Detrusor overactivity    urinary bladder   Esophageal varices ([place])    followed by dr Henrene Pastor---  nonbleeding   Fibroid, uterine    Full dentures    Hepatic cirrhosis due to chronic hepatitis C infection ([place])    followed by [place] NP---  s/p treated chronic hep c with cure and complicated by portal hypertension and esophageal varies   History of CVA (cerebrovascular accident) without residual deficits 06/2016   neurologist--- Frann Rider---  hospital admission 06-27-2016 right [place] infart with  left MCA aneurysm,  no residual per pt   History of hepatitis C    post treatement and cured   Hyperlipidemia    Hypertension    IDA (iron deficiency anemia)    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 ;  followed by neurologist   Peripheral vascular disease ([place])    Self-catheterizes urinary bladder    Stroke ([place]) 2018   Thrombocytopenia ([place]) 12/25/2014   Type 2 diabetes mellitus ([place])    followed by pcp---- (01-14-2021 pt stated check blood sugar at home twice weekly fasting, did not know average)   Vaginal atrophy     Past Surgical History:  Procedure Laterality Date   CESAREAN SECTION  1973   WITH BILATERAL TUBAL LIGATION   COLONOSCOPY  2017   PER PT SCHEDULED FOR NEXT ONE 02-02-2021   CYSTOSCOPY N/A 01/19/2021  Procedure: CYSTOSCOPY;  Surgeon: [name], MD;  Location: [facility];  Service: Gynecology;  Laterality: N/A;   ESOPHAGOGASTRODUODENOSCOPY  11/27/2018   LAST ONE BY DR PERRY   IR GENERIC HISTORICAL  06/30/2016   IR ANGIO VERTEBRAL SEL SUBCLAVIAN INNOMINATE UNI R MOD SED 06/30/2016 [name], MD [facility]   IR GENERIC HISTORICAL  06/30/2016   IR ANGIO VERTEBRAL SEL VERTEBRAL UNI L MOD SED 06/30/2016 [name], MD [facility]   IR GENERIC HISTORICAL  06/30/2016   IR ANGIO  INTRA EXTRACRAN SEL COM CAROTID INNOMINATE BILAT MOD SED 06/30/2016 [name], MD [facility]   LEFT HEART CATH AND CORONARY ANGIOGRAPHY N/A 03/25/2020   Procedure: LEFT HEART CATH AND CORONARY ANGIOGRAPHY;  Surgeon: Jettie Booze, MD;  Location: [facility];  Service: Cardiovascular;  Laterality: N/A;   MULTIPLE EXTRACTIONS WITH ALVEOLOPLASTY  10/25/2011   Procedure: MULTIPLE EXTRACION WITH ALVEOLOPLASTY;  Surgeon: Gae Bon, DDS;  Location: [place];  Service: Oral Surgery;  Laterality: Bilateral;  Extract teeth numbers one, two, six, seven, eight, nine, ten, eleven, twelve, fifteen, sixteen, eighteen, nineteen, twenty-three, twenty-four, twenty-five, twenty-seven, thirty-two and alveoplasty.   ORIF ANKLE FRACTURE Right 11/25/2007   TOOTH EXTRACTION N/A 10/20/2020   Procedure: MULTIPLE DENTAL RESTORATION/EXTRACTIONS, AVELOPLASTY;  Surgeon: Diona Browner, DMD;  Location: [facility];  Service: Oral Surgery;  Laterality: N/A;   UPPER GASTROINTESTINAL ENDOSCOPY      Social History:  reports that she has never smoked. She has never used smokeless tobacco. She reports that she does not currently use alcohol after a past usage of about 2.0 standard drinks per week. She reports that she does not currently use drugs.  Allergies: No Known Allergies  Family History:  Family History  Problem Relation Age of Onset   Diabetes type II Mother    Hypertension Mother    Stroke Mother    Cancer Father        patient does not know what type of cancer   Diabetes Daughter    Breast cancer Neg Hx    Colon polyps Neg Hx    Crohn's disease Neg Hx    Esophageal cancer Neg Hx    Rectal cancer Neg Hx    Stomach cancer Neg Hx    Colon cancer Neg Hx      Current Outpatient Medications:    Accu-Chek FastClix Lancets MISC, 1 each by Does not apply route daily., Disp: 100 each, Rfl: 3   ACCU-CHEK GUIDE test strip, USE TO check blood glucose twice daily, Disp: 100 strip, Rfl: 2   Accu-Chek  Softclix Lancets lancets, USE AS DIRECTED TO CHECK BLOOD SUGAR TWICE A DAY, Disp: 100 each, Rfl: 0   amLODipine (NORVASC) 10 MG tablet, TAKE ONE TABLET BY MOUTH EVERY MORNING FOR BLOOD PRESSURE (Patient taking differently: Take 10 mg by mouth daily.), Disp: 90 tablet, Rfl: 1   ASPIRIN LOW DOSE 81 MG EC tablet, TAKE 1 TABLET BY MOUTH EVERY DAY (Patient taking differently: Take 81 mg by mouth daily.), Disp: 90 tablet, Rfl: 1   Blood Glucose Monitoring Suppl (ACCU-CHEK GUIDE) w/Device KIT, Please specify directions, refills and quantity, Disp: 1 kit, Rfl: 0   carboxymethylcellulose (REFRESH PLUS) 0.5 % SOLN, Place 1 drop into both eyes daily., Disp: , Rfl:    Catheters (SELF-CATH SYSTEM STRAIGHT TIP) KIT, 1 each by Does not apply route as needed., Disp: 120 kit, Rfl: 11   chlorhexidine (PERIDEX) 0.12 % solution, Use as directed 15 mLs  in the mouth or throat 2 (two) times daily., Disp: , Rfl:    Cholecalciferol 25 MCG (1000 UT) capsule, Take 1,000 Units by mouth daily. , Disp: , Rfl:    Continuous Blood Gluc Receiver (FREESTYLE LIBRE 2 READER) DEVI, 1 each by Does not apply route daily., Disp: 2 each, Rfl: 11   Continuous Blood Gluc Sensor (FREESTYLE LIBRE 2 SENSOR) MISC, 1 each by Does not apply route daily., Disp: 2 each, Rfl: 11   cyanocobalamin 1000 MCG tablet, Take 1,000 mcg by mouth daily. , Disp: , Rfl:    estradiol (ESTRACE) 0.1 MG/GM vaginal cream, Place 0.5 g vaginally 2 (two) times a week. Place 0.5g nightly for two weeks then twice a week after, Disp: 30 g, Rfl: 11   ferrous sulfate 325 (65 FE) MG EC tablet, Take 1 tablet (325 mg total) by mouth in the morning and at bedtime. (Patient taking differently: Take 325 mg by mouth in the morning and at bedtime.), Disp: , Rfl: 3   hydrochlorothiazide (HYDRODIURIL) 25 MG tablet, TAKE ONE TABLET BY MOUTH EVERY MORNING FOR fluid AND FOR BLOOD PRESSURE (Patient taking differently: Take 25 mg by mouth daily.), Disp: 90 tablet, Rfl: 1   lidocaine  (XYLOCAINE) 2 % jelly, Apply 1 application topically as needed. (Patient taking differently: Apply 1 application topically as needed (pain).), Disp: 30 mL, Rfl: 5   metFORMIN (GLUCOPHAGE) 500 MG tablet, TAKE ONE TABLET BY MOUTH EVERY MORNING FOR diabetes (Patient taking differently: Take 500 mg by mouth daily with breakfast.), Disp: 180 tablet, Rfl: 1   REPATHA SURECLICK 409 MG/ML SOAJ, INJECT 171m into THE SKIN every 14 DAYS FOR cholesterol (Patient taking differently: Inject 140 mg into the skin every 14 (fourteen) days.), Disp: 2 mL, Rfl: 11   tamsulosin (FLOMAX) 0.4 MG CAPS capsule, TAKE ONE CAPSULE BY MOUTH DAILY FOR FOR BLADDER, Disp: 30 capsule, Rfl: 5   triamcinolone cream (KENALOG) 0.1 %, APPLY TO THE AFFECTED AREA(S) TWICE DAILY FOR SKIN condition, Disp: 80 g, Rfl: 4   Trospium Chloride 60 MG CP24, TAKE ONE CAPSULE BY MOUTH EVERY MORNING, Disp: 30 capsule, Rfl: 5   omeprazole (PRILOSEC) 40 MG capsule, Take 1 capsule (40 mg total) by mouth daily for 14 days., Disp: 14 capsule, Rfl: 0  Review of Systems:  Constitutional: Denies fever, chills, diaphoresis, appetite change and fatigue.  HEENT: Denies photophobia, eye pain, redness, hearing loss, ear pain, congestion, sore throat, rhinorrhea, sneezing, mouth sores, trouble swallowing, neck pain, neck stiffness and tinnitus.   Respiratory: Denies SOB, DOE, cough, chest tightness,  and wheezing.   Cardiovascular: Denies chest pain, palpitations and leg swelling.  Gastrointestinal: Denies nausea, vomiting, abdominal pain, diarrhea, constipation, blood in stool and abdominal distention.  Genitourinary: Denies dysuria, urgency, frequency, hematuria, flank pain and difficulty urinating.  Endocrine: Denies: hot or cold intolerance, sweats, changes in hair or nails, polyuria, polydipsia. Musculoskeletal: Denies myalgias, back pain, joint swelling, arthralgias and gait problem.  Skin: Denies pallor, rash and wound.  Neurological: Denies dizziness,  seizures, syncope, weakness, light-headedness, numbness and headaches.  Hematological: Denies adenopathy. Easy bruising, personal or family bleeding history  Psychiatric/Behavioral: Denies suicidal ideation, mood changes, confusion, nervousness, sleep disturbance and agitation    Physical Exam: Vitals:   06/18/21 0921  BP: 128/70  Pulse: 84  Temp: 97.6 F (36.4 C)  TempSrc: Oral  SpO2: 97%  Weight: 170 lb 2 oz (77.2 kg)  Height: 5' 4"  (1.626 m)    Body mass index is 29.2 kg/m.  Constitutional: NAD, calm, comfortable Eyes: PERRL, lids and conjunctivae normal ENMT: Mucous membranes are moist.  Respiratory: clear to auscultation bilaterally, no wheezing, no crackles. Normal respiratory effort. No accessory muscle use.  Cardiovascular: Regular rate and rhythm, no murmurs / rubs / gallops. No extremity edema. Neurologic: Grossly intact and nonfocal Psychiatric: Normal judgment and insight. Alert and oriented x 3. Normal mood.    Impression and Plan:  Need for influenza vaccination -Flu vaccine administered today.  Need for vaccination against Streptococcus pneumoniae -PCV 20 administered today.  Encounter for screening for osteoporosis -DEXA scan requested.  Malignant hypertension -Blood pressures well controlled today.  Type 2 diabetes mellitus with other neurologic complication, without long-term current use of insulin ([place])  - Plan: CBC with Differential/Platelet, Comprehensive metabolic panel, Hemoglobin A1c -Check A1c, refer to recovery in March 2022.  Hyperlipidemia associated with type 2 diabetes mellitus ([place])  - Plan: Lipid panel -Last lipid panel in March 2022 with a total cholesterol of 147, triglycerides 108 and LDL 64, she is on Repatha.  Coronary artery disease involving native coronary artery of native heart without angina pectoris Chronic diastolic CHF (congestive heart failure) ([place]) -Advised to schedule follow-up with cardiology as she has not  seen them in over a year.  Alcoholic cirrhosis of liver without ascites ([place]) -Presumably due to alcohol usage and hepatitis C. -She follows with GI and had a surveillance endoscopy for gastric varices last year.  Time spent: 33 minutes reviewing chart, interviewing and examining patient and formulating plan of care.   Patient Instructions  -Nice seeing you today!!  -Lab work today; will notify you once results are available.  -Flu and pneumonia vaccines today.  -Remember your COVID booster and shingles vaccines at the pharmacy.  -Schedule follow up in 3-4 months.    [name], MD [facility]

## 2021-06-18 NOTE — Addendum Note (Signed)
Addended by: Rodrigo Ran on: 06/18/2021 09:54 AM   Modules accepted: Orders

## 2021-06-23 DIAGNOSIS — R339 Retention of urine, unspecified: Secondary | ICD-10-CM | POA: Diagnosis not present

## 2021-06-24 ENCOUNTER — Telehealth: Payer: Self-pay | Admitting: Internal Medicine

## 2021-06-24 NOTE — Telephone Encounter (Signed)
Patient called to ask if a referral was placed for a heart doctor by Dr. Jerilee Hoh. Asked patient if a referral was supposed to be placed for one and she stated that she did not know and could not remember what Dr. Jerilee Hoh said during her last visit.  Patient would like a call at [phone] if a referral is needing to be placed.  Please advise.

## 2021-06-24 NOTE — Telephone Encounter (Signed)
Spoke with patient and advised that she needs to call and schedule cardiology appt. Provided phone number. Also advised that someone would be contacting her with date for dexa scan.

## 2021-06-30 ENCOUNTER — Telehealth: Payer: Self-pay | Admitting: Pharmacist

## 2021-06-30 ENCOUNTER — Other Ambulatory Visit: Payer: Self-pay | Admitting: Internal Medicine

## 2021-06-30 DIAGNOSIS — I10 Essential (primary) hypertension: Secondary | ICD-10-CM

## 2021-06-30 NOTE — Chronic Care Management (AMB) (Signed)
Chronic Care Management Pharmacy Assistant   Name: Deborah Wise  MRN: [id number] DOB: [birth year]   Reason for Encounter: Disease State and Medication Review / Hypertension Assessment and Medication Coordination Call   Conditions to be addressed/monitored: HTN  Recent office visits:  06/18/2021 Deborah Frohlich MD - Patient was seen for need for influenza vaccination and additional issues. No medication changes.  Follow up in 3-4 months.  Recent consult visits:  None  Hospital visits:  None  Medications: Outpatient Encounter Medications as of 06/30/2021  Medication Sig Note   Accu-Chek FastClix Lancets MISC 1 each by Does not apply route daily.    ACCU-CHEK GUIDE test strip USE TO check blood glucose twice daily    Accu-Chek Softclix Lancets lancets USE AS DIRECTED TO CHECK BLOOD SUGAR TWICE A DAY    amLODipine (NORVASC) 10 MG tablet TAKE ONE TABLET BY MOUTH EVERY MORNING FOR BLOOD PRESSURE (Patient taking differently: Take 10 mg by mouth daily.)    ASPIRIN LOW DOSE 81 MG EC tablet TAKE 1 TABLET BY MOUTH EVERY DAY (Patient taking differently: Take 81 mg by mouth daily.)    Blood Glucose Monitoring Suppl (ACCU-CHEK GUIDE) w/Device KIT Please specify directions, refills and quantity    carboxymethylcellulose (REFRESH PLUS) 0.5 % SOLN Place 1 drop into both eyes daily.    Catheters (SELF-CATH SYSTEM STRAIGHT TIP) KIT 1 each by Does not apply route as needed.    chlorhexidine (PERIDEX) 0.12 % solution Use as directed 15 mLs in the mouth or throat 2 (two) times daily.    Cholecalciferol 25 MCG (1000 UT) capsule Take 1,000 Units by mouth daily.     Continuous Blood Gluc Receiver (FREESTYLE LIBRE 2 READER) DEVI 1 each by Does not apply route daily.    Continuous Blood Gluc Sensor (FREESTYLE LIBRE 2 SENSOR) MISC 1 each by Does not apply route daily.    cyanocobalamin 1000 MCG tablet Take 1,000 mcg by mouth daily.     estradiol (ESTRACE) 0.1 MG/GM vaginal cream Place 0.5 g  vaginally 2 (two) times a week. Place 0.5g nightly for two weeks then twice a week after 01/14/2021: Per pt uses as needed   ferrous sulfate 325 (65 FE) MG EC tablet Take 1 tablet (325 mg total) by mouth in the morning and at bedtime. (Patient taking differently: Take 325 mg by mouth in the morning and at bedtime.)    hydrochlorothiazide (HYDRODIURIL) 25 MG tablet TAKE ONE TABLET BY MOUTH EVERY MORNING FOR fluid AND FOR BLOOD PRESSURE (Patient taking differently: Take 25 mg by mouth daily.)    lidocaine (XYLOCAINE) 2 % jelly Apply 1 application topically as needed. (Patient taking differently: Apply 1 application topically as needed (pain).)    metFORMIN (GLUCOPHAGE) 500 MG tablet TAKE ONE TABLET BY MOUTH EVERY MORNING FOR diabetes (Patient taking differently: Take 500 mg by mouth daily with breakfast.)    omeprazole (PRILOSEC) 40 MG capsule Take 1 capsule (40 mg total) by mouth daily for 14 days.    REPATHA SURECLICK 728 MG/ML SOAJ INJECT 142m into THE SKIN every 14 DAYS FOR cholesterol (Patient taking differently: Inject 140 mg into the skin every 14 (fourteen) days.)    tamsulosin (FLOMAX) 0.4 MG CAPS capsule TAKE ONE CAPSULE BY MOUTH DAILY FOR FOR BLADDER    triamcinolone cream (KENALOG) 0.1 % APPLY TO THE AFFECTED AREA(S) TWICE DAILY FOR SKIN condition    Trospium Chloride 60 MG CP24 TAKE ONE CAPSULE BY MOUTH EVERY MORNING    No facility-administered  encounter medications on file as of 06/30/2021.  Reviewed chart prior to disease state call. Spoke with patient regarding BP  Recent Office Vitals: BP Readings from Last 3 Encounters:  06/18/21 128/70  02/26/21 (!) 170/82  02/16/21 (!) 177/88   Pulse Readings from Last 3 Encounters:  06/18/21 84  02/26/21 88  02/16/21 100    Wt Readings from Last 3 Encounters:  06/18/21 170 lb 2 oz (77.2 kg)  02/26/21 168 lb 4 oz (76.3 kg)  02/16/21 167 lb (75.8 kg)     Kidney Function Lab Results  Component Value Date/Time   CREATININE 0.80  06/18/2021 09:48 AM   CREATININE 0.76 01/19/2021 10:16 AM   CREATININE 0.8 12/31/2014 02:29 PM   GFR 75.20 06/18/2021 09:48 AM   GFRNONAA >60 01/19/2021 10:16 AM   GFRAA 84 03/18/2020 03:56 PM    BMP Latest Ref Rng & Units 06/18/2021 01/19/2021 10/20/2020  Glucose 70 - 99 mg/dL 120(H) 124(H) 120(H)  BUN 6 - 23 mg/dL 17 13 11   Creatinine 0.40 - 1.20 mg/dL 0.80 0.76 0.75  BUN/Creat Ratio 12 - 28 - - -  Sodium 135 - 145 mEq/L 138 138 139  Potassium 3.5 - 5.1 mEq/L 3.5 4.8 3.7  Chloride 96 - 112 mEq/L 101 107 106  CO2 19 - 32 mEq/L 28 24 24   Calcium 8.4 - 10.5 mg/dL 10.4 9.6 9.7    Current antihypertensive regimen:  Amlodipine 10 mg daily  How often are you checking your Blood Pressure?         NOTE: Patient is to be checking BP's regular   Current home BP readings:   What recent interventions/DTPs have been made by any provider to improve Blood Pressure control since last CPP Visit: Patient is to set a reminder to check her blood pressures and check them more regular.  Any recent hospitalizations or ED visits since last visit with CPP? No recent hospital visits.   What diet changes have been made to improve Blood Pressure Control?    What exercise is being done to improve your Blood Pressure Control?    Adherence Review: Is the patient currently on ACE/ARB medication? No Does the patient have >5 day gap between last estimated fill dates? No  Reviewed chart for medication changes ahead of medication coordination call.  No OVs, Consults, or hospital visits since last care coordination call/Pharmacist visit. (If appropriate, list visit date, provider name)  No medication changes indicated OR if recent visit, treatment plan here.  BP Readings from Last 3 Encounters:  06/18/21 128/70  02/26/21 (!) 170/82  02/16/21 (!) 177/88    Lab Results  Component Value Date   HGBA1C 6.2 06/18/2021     Patient obtains medications through Adherence Packaging  30 Days    Last  adherence delivery included: Repatha Sureclick 915 MG/ML: inject every 14 days Triamcinolone cream (KENALOG) 0.1 %: apply to affected areas twice daily Amlodipine 10 mg: one tablet at breakfast Aspirin 81 mg: one tablet at breakfast Vitamin D3 25 mcg: one capsule at breakfast Vitamin B-12 Daily 1000 MCG: one tablet at breakfast Hydrochlorothiazide 25 mg: one tablet at breakfast Metformin 500 mg: one tablet at breakfast Tamsulosin HCl 0.4 mg: one capsule at breakfast Ferrous sulfate 325 (65 FE) MG EC tablet: one tablet at breakfast and one tablet at bedtime Trospium Chloride 60 mg Oral Daily Accu chek test strips - use to check blood sugar twice daily   Patient declined (meds) last month:No medications declined  Patient is due for next adherence delivery on: 06/12/2021.   Unable to reach patient and reviewed medications and coordinated delivery.   This delivery to include: Repatha Sureclick 615 MG/ML: inject every 14 days Triamcinolone cream (KENALOG) 0.1 %: apply to affected areas twice daily Amlodipine 10 mg: one tablet at breakfast Aspirin 81 mg: one tablet at breakfast Vitamin D3 MCG 1000-unit: one capsule at breakfast Vitamin B-12 Daily 1000 MCG: one tablet at breakfast Hydrochlorothiazide 25 mg: one tablet at breakfast Metformin 500 mg: one tablet at breakfast Tamsulosin HCl 0.4 mg: one capsule at breakfast Ferrous sulfate 325 (65 FE) MG EC tablet: one tablet at breakfast and one tablet at bedtime Trospium Chloride 60 mg Oral Daily Accu chek test strips - use to check blood sugar twice daily.    Patient will need a short fill:    Coordinated acute fill:    Patient declined the following medications:     Unable to reach patient after multiple attempts to confirmed delivery date of 06/12/2021.   Care Gaps: AWV - message previously sent to Ramond Craver.  Ophthalmology exam - never done Zoster vaccines [facility]) - never done DEXA SCAN -  never done Urine  microalbumin - overdue  Foot exam - overdue  Covid-19 vaccine -  overdue Influenza vaccine - overdue Last BP 128/70 on 06/18/2021 Last A1C - 6.2 on 06/18/2021  Star Rating Drugs: Metformin 526m - last [place]/20[phone]

## 2021-07-01 ENCOUNTER — Ambulatory Visit (INDEPENDENT_AMBULATORY_CARE_PROVIDER_SITE_OTHER): Payer: Commercial Managed Care - HMO | Admitting: Obstetrics and Gynecology

## 2021-07-01 ENCOUNTER — Encounter: Payer: Self-pay | Admitting: Obstetrics and Gynecology

## 2021-07-01 ENCOUNTER — Other Ambulatory Visit: Payer: Self-pay

## 2021-07-01 VITALS — BP 192/78 | HR 70

## 2021-07-01 DIAGNOSIS — N319 Neuromuscular dysfunction of bladder, unspecified: Secondary | ICD-10-CM

## 2021-07-01 DIAGNOSIS — K5900 Constipation, unspecified: Secondary | ICD-10-CM | POA: Diagnosis not present

## 2021-07-01 DIAGNOSIS — N3281 Overactive bladder: Secondary | ICD-10-CM

## 2021-07-01 DIAGNOSIS — R350 Frequency of micturition: Secondary | ICD-10-CM | POA: Diagnosis not present

## 2021-07-01 DIAGNOSIS — I69398 Other sequelae of cerebral infarction: Secondary | ICD-10-CM

## 2021-07-01 DIAGNOSIS — N952 Postmenopausal atrophic vaginitis: Secondary | ICD-10-CM | POA: Diagnosis not present

## 2021-07-01 LAB — POCT URINALYSIS DIPSTICK
Appearance: ABNORMAL
Bilirubin, UA: NEGATIVE
Glucose, UA: NEGATIVE
Ketones, UA: NEGATIVE
Leukocytes, UA: NEGATIVE
Nitrite, UA: NEGATIVE
Protein, UA: POSITIVE — AB
Spec Grav, UA: 1.025 (ref 1.010–1.025)
Urobilinogen, UA: 1 U/dL
pH, UA: 5 (ref 5.0–8.0)

## 2021-07-01 MED ORDER — ESTRADIOL 0.1 MG/GM VA CREA: 0.5000 g | TOPICAL_CREAM | VAGINAL | 11 refills | Status: AC

## 2021-07-01 MED ORDER — DOCUSATE SODIUM 100 MG PO CAPS: 100.0000 mg | ORAL_CAPSULE | Freq: Two times a day (BID) | ORAL | 11 refills | Status: AC

## 2021-07-01 MED ORDER — TROSPIUM CHLORIDE ER 60 MG PO CP24: 1.0000 | ORAL_CAPSULE | Freq: Every morning | ORAL | 11 refills | Status: AC

## 2021-07-01 NOTE — Progress Notes (Signed)
[facility] Return Visit  SUBJECTIVE  History of Present Illness: Deborah Wise is a 71 y.o. female seen in follow-up for incomplete bladder emptying and vaginal atrophy.  Cystoscopy in OR 01/2021 did not show any bladder abnormalities.   Has been using trospium 60mg  daily. Currently self-catheterizing after every time she urinates. She feels today like she might have a urinary tract infection because she sometimes has burning with urination. Has been drinking soda, milk, juice but not a lot of water. Not using lidocaine jelly.   Has been using the estrogen cream but not often.   Has bowel movements 1-2 times per week. Has been doing miralax sometimes, but not consistent.   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   Past Surgical History: She  has a past surgical history that includes Multiple extractions with alveoloplasty (10/25/2011); ir generic historical (06/30/2016); ir generic historical (06/30/2016); ir generic historical (06/30/2016); ORIF ankle fracture (Right, 11/25/2007); Colonoscopy (2017); LEFT HEART CATH AND CORONARY ANGIOGRAPHY (N/A, 03/25/2020); Cesarean section (1973); Tooth Extraction (N/A, 10/20/2020); Esophagogastroduodenoscopy (11/27/2018); Cystoscopy (N/A,  01/19/2021); and Upper gastrointestinal endoscopy.   Medications: She has a current medication list which includes the following prescription(s): accu-chek fastclix lancets, accu-chek guide, accu-chek softclix lancets, amlodipine, aspirin low dose, accu-chek guide, carboxymethylcellulose, self-cath system straight tip, chlorhexidine, cholecalciferol, freestyle libre 2 reader, freestyle libre 2 sensor, cyanocobalamin, docusate sodium, ferrous sulfate, hydrochlorothiazide, lidocaine, metformin, repatha sureclick, tamsulosin, triamcinolone cream, [START ON 07/02/2021] estradiol, omeprazole, and trospium chloride.   Allergies: Patient has No Known Allergies.   Social History: Patient  reports that she has never smoked. She has never used smokeless tobacco. She reports that she does not currently use alcohol after a past usage of about 2.0 standard drinks per week. She reports that she does not currently use drugs.      OBJECTIVE     Physical Exam: Vitals:   07/01/21 0820  BP: (!) 192/78  Pulse: 70    Gen: No apparent distress, A&O x 3.  Detailed Urogynecologic Evaluation:  Deferred.   Self catheterized specimen: trace blood otherwise negative.    ASSESSMENT AND PLAN    [name] is a 71 y.o. with:  1. Urinary frequency   2. Constipation, unspecified constipation type   3. Vaginal atrophy   4. Detrusor overactivity   5. Neurogenic bladder as late effect of cerebrovascular accident (CVA)       1. BOO/ Detrusor overactivity/ elevated detrusor pressure - Continue to self catheterize at least 4-5 times daily. Has been getting supplies from [employer]. Requesting gloves as well- will contact them for the prescription.  - Continue Trospium 60mg  daily to reduce urgency and detrusor pressures.  - Annual upper tract imaging- last 09/2020. Renal US ordered to be done on 09/2021 - Annual renal function assessment: 06/18/21 Cr 0.8, GFR 75   2. Vaginal atrophy/ irritation - Use estrace  cream 0.5g twice a week - Can use coconut oil in between for irritation.  - can use lidocaine jelly on  the urethra as needed- previously ordered, should have refills available.   3. Constipation - increase water intake  - continue miralax but take it daily with large glass of water - colace 100mg  prescribed to take twice a day  Return 6 months   [name], MD   Time spent: I spent 25 minutes dedicated to the care of this patient on the date of this encounter to include pre-visit review of records, face-to-face time with the patient and post visit documentation and ordering medication/ testing.

## 2021-07-01 NOTE — Patient Instructions (Addendum)
For constipation, take miralax daily in a glass of water. Take colace twice a day. Make sure you are drinking water throughout the day.   Use estrogen cream twice a week- a small pea sized amount in the vagina.   Use lidocaine jelly on the urethra if you are having some burning.   Continue to take the trospium 60mg  daily for the bladder.

## 2021-07-06 ENCOUNTER — Telehealth: Payer: Self-pay | Admitting: Internal Medicine

## 2021-07-06 NOTE — Telephone Encounter (Signed)
I spoke with the pt and she stated she scheduled an appointment herself to see ortho and referral is no longer necessary.

## 2021-07-06 NOTE — Telephone Encounter (Signed)
Patient called in stating that she and Dr.Hernandez discussed her being referred to an ankle doctor. Patient wants to know which ankle doctor she was referred to.  Patient could be contacted at [phone].  Please advise.

## 2021-07-07 DIAGNOSIS — E1149 Type 2 diabetes mellitus with other diabetic neurological complication: Secondary | ICD-10-CM

## 2021-07-07 DIAGNOSIS — I10 Essential (primary) hypertension: Secondary | ICD-10-CM

## 2021-07-09 ENCOUNTER — Telehealth: Payer: Self-pay | Admitting: *Deleted

## 2021-07-09 MED ORDER — ACCU-CHEK GUIDE W/DEVICE KIT: PACK | 0 refills | Status: DC

## 2021-07-09 NOTE — Telephone Encounter (Signed)
Rx sent 

## 2021-07-09 NOTE — Telephone Encounter (Signed)
-----   Message from Viona Gilmore, [facility] sent at 07/07/2021 11:28 AM EST ----- Regarding: New glucometer Hi,  Ms. Cumpian requested a new glucometer. Can you please send in a prescription for an accu-chek glucometer to [facility]?  Thank you! Maddie

## 2021-07-15 ENCOUNTER — Ambulatory Visit (INDEPENDENT_AMBULATORY_CARE_PROVIDER_SITE_OTHER): Payer: Medicare Other | Admitting: Podiatry

## 2021-07-15 DIAGNOSIS — Z91199 Patient's noncompliance with other medical treatment and regimen due to unspecified reason: Secondary | ICD-10-CM

## 2021-07-15 NOTE — Progress Notes (Signed)
No show

## 2021-07-22 DIAGNOSIS — K7469 Other cirrhosis of liver: Secondary | ICD-10-CM | POA: Diagnosis not present

## 2021-07-22 DIAGNOSIS — I8510 Secondary esophageal varices without bleeding: Secondary | ICD-10-CM | POA: Diagnosis not present

## 2021-07-23 ENCOUNTER — Other Ambulatory Visit: Payer: Self-pay | Admitting: Nurse Practitioner

## 2021-07-23 DIAGNOSIS — K7469 Other cirrhosis of liver: Secondary | ICD-10-CM

## 2021-07-27 DIAGNOSIS — R0600 Dyspnea, unspecified: Secondary | ICD-10-CM | POA: Diagnosis not present

## 2021-07-27 DIAGNOSIS — R059 Cough, unspecified: Secondary | ICD-10-CM | POA: Diagnosis not present

## 2021-07-27 DIAGNOSIS — J209 Acute bronchitis, unspecified: Secondary | ICD-10-CM | POA: Diagnosis not present

## 2021-07-27 DIAGNOSIS — R339 Retention of urine, unspecified: Secondary | ICD-10-CM | POA: Diagnosis not present

## 2021-07-28 ENCOUNTER — Telehealth: Payer: Self-pay | Admitting: Internal Medicine

## 2021-07-28 ENCOUNTER — Other Ambulatory Visit: Payer: Self-pay | Admitting: Internal Medicine

## 2021-07-28 MED ORDER — ACCU-CHEK SOFTCLIX LANCETS MISC: 0 refills | Status: DC

## 2021-07-28 NOTE — Telephone Encounter (Signed)
Pt is calling and needs a refill on Accu-Chek Softclix Lancets lancets send to  [facility] - [place], [place] - [address] Phone:  [phone]  Fax:  [phone]

## 2021-07-28 NOTE — Telephone Encounter (Signed)
Rx sent 

## 2021-07-30 ENCOUNTER — Telehealth: Payer: Self-pay | Admitting: Pharmacist

## 2021-07-30 NOTE — Chronic Care Management (AMB) (Addendum)
Chronic Care Management Pharmacy Assistant   Name: Deborah Wise  MRN: [id number] DOB: 1951/05/10  Reason for Encounter: Medication Review / Medication Coordination Call   Conditions to be addressed/monitored: HTN  Recent office visits:  None  Recent consult visits:  07/01/2021 Sherlene Shams MD (OBGYN) - Patient was seen for urinary frequency and additional issues.  Started Docusate sodium 100 mg twice daily. No follow up noted.  Hospital visits:  None  Medications: Outpatient Encounter Medications as of 07/30/2021  Medication Sig   ACCU-CHEK GUIDE test strip USE TO check blood glucose twice daily   Accu-Chek Softclix Lancets lancets USE AS DIRECTED ONCE DAILY   Accu-Chek Softclix Lancets lancets USE AS DIRECTED TO CHECK BLOOD SUGAR TWICE A DAY   amLODipine (NORVASC) 10 MG tablet TAKE ONE TABLET BY MOUTH EVERY MORNING FOR BLOOD PRESSURE   ASPIRIN LOW DOSE 81 MG EC tablet TAKE 1 TABLET BY MOUTH EVERY DAY (Patient taking differently: Take 81 mg by mouth daily.)   Blood Glucose Monitoring Suppl (ACCU-CHEK GUIDE) w/Device KIT Dx E11.9   carboxymethylcellulose (REFRESH PLUS) 0.5 % SOLN Place 1 drop into both eyes daily.   Catheters (SELF-CATH SYSTEM STRAIGHT TIP) KIT 1 each by Does not apply route as needed.   chlorhexidine (PERIDEX) 0.12 % solution Use as directed 15 mLs in the mouth or throat 2 (two) times daily.   Cholecalciferol 25 MCG (1000 UT) capsule Take 1,000 Units by mouth daily.    Continuous Blood Gluc Receiver (FREESTYLE LIBRE 2 READER) DEVI 1 each by Does not apply route daily.   Continuous Blood Gluc Sensor (FREESTYLE LIBRE 2 SENSOR) MISC 1 each by Does not apply route daily.   cyanocobalamin 1000 MCG tablet Take 1,000 mcg by mouth daily.    docusate sodium (COLACE) 100 MG capsule Take 1 capsule (100 mg total) by mouth 2 (two) times daily.   estradiol (ESTRACE) 0.1 MG/GM vaginal cream Place 0.5 g vaginally 2 (two) times a week. Place 0.5g nightly for two  weeks then twice a week after   ferrous sulfate 325 (65 FE) MG EC tablet Take 1 tablet (325 mg total) by mouth in the morning and at bedtime. (Patient taking differently: Take 325 mg by mouth in the morning and at bedtime.)   hydrochlorothiazide (HYDRODIURIL) 25 MG tablet TAKE ONE TABLET BY MOUTH EVERY MORNING FOR fluid & FOR BLOOD PRESSURE   lidocaine (XYLOCAINE) 2 % jelly Apply 1 application topically as needed. (Patient taking differently: Apply 1 application topically as needed (pain).)   metFORMIN (GLUCOPHAGE) 500 MG tablet TAKE ONE TABLET BY MOUTH EVERY MORNING FOR diabetes (Patient taking differently: Take 500 mg by mouth daily with breakfast.)   omeprazole (PRILOSEC) 40 MG capsule Take 1 capsule (40 mg total) by mouth daily for 14 days.   REPATHA SURECLICK 409 MG/ML SOAJ INJECT 172m into THE SKIN every 14 DAYS FOR cholesterol (Patient taking differently: Inject 140 mg into the skin every 14 (fourteen) days.)   tamsulosin (FLOMAX) 0.4 MG CAPS capsule TAKE ONE CAPSULE BY MOUTH DAILY FOR FOR BLADDER   triamcinolone cream (KENALOG) 0.1 % APPLY TO THE AFFECTED AREA(S) TWICE DAILY FOR SKIN condition   Trospium Chloride 60 MG CP24 Take 1 capsule (60 mg total) by mouth every morning.   No facility-administered encounter medications on file as of 07/30/2021.   Reviewed chart for medication changes ahead of medication coordination call.  No OVs, Consults, or hospital visits since last care coordination call/Pharmacist visit. (If appropriate, list visit date,  provider name)  No medication changes indicated OR if recent visit, treatment plan here.  BP Readings from Last 3 Encounters:  07/01/21 (!) 192/78  06/18/21 128/70  02/26/21 (!) 170/82    Lab Results  Component Value Date   HGBA1C 6.2 06/18/2021     Patient obtains medications through Adherence Packaging  30 Days    Last adherence delivery included: Repatha Sureclick 382 MG/ML: inject every 14 days Triamcinolone cream (KENALOG) 0.1  %: apply to affected areas twice daily Amlodipine 10 mg: one tablet at breakfast Aspirin 81 mg: one tablet at breakfast Vitamin D3 MCG 1000-unit: one capsule at breakfast Vitamin B-12 Daily 1000 MCG: one tablet at breakfast Hydrochlorothiazide 25 mg: one tablet at breakfast Metformin 500 mg: one tablet at breakfast Tamsulosin HCl 0.4 mg: one capsule at breakfast Ferrous sulfate 325 (65 FE) MG EC tablet: one tablet at breakfast and one tablet at bedtime Trospium Chloride 60 mg Oral Daily Accu chek test strips - use to check blood sugar twice daily.    Patient declined (meds) last month:No medications declined       Patient is due for next adherence delivery on: 08/11/2021   Unable to reach patient and reviewed medications and coordinated delivery.   This delivery to include: Repatha Sureclick 505 MG/ML: inject every 14 days Triamcinolone cream (KENALOG) 0.1 %: apply to affected areas twice daily Amlodipine 10 mg: one tablet at breakfast Aspirin 81 mg: one tablet at breakfast Vitamin D3 25 MCG 1000-unit: one capsule at breakfast Vitamin B-12 Daily 1000 MCG: one tablet at breakfast Hydrochlorothiazide 25 mg: one tablet at breakfast Metformin 500 mg: one tablet at breakfast Tamsulosin HCl 0.4 mg: one capsule at breakfast Ferrous sulfate 325 (65 FE) MG EC tablet: one tablet at breakfast and one tablet at bedtime Trospium Chloride 60 mg: one tablet at breakfast  Accu chek test strips - use to check blood sugar twice daily.  chlorhexidine (PERIDEX) 0.12 % solution 15 mLs in the mouth or throat 2 (two) times daily estradiol (ESTRACE) 0.1 MG/GM vaginal cream 0.5 mg twice weekly at bedtime   Patient will need a short fill: No short fills needed   Coordinated acute fill: No acute fills needed   Patient declined the following medications:  No medications declined   Confirmed with patient delivery date of 08/11/2021    Note: Checked with patient to see if she was checking her blood  pressures at home.  She states she just started checking this past Sunday and her reading was 146/80.  She was encouraged to continue checking at least 3 times per week and keeping a log on paper.  Patient agreed.   Care Gaps: AWV - message previously sent to Ramond Craver.  Last BP 192/78 on 07/01/2021 Last A1C - 6.2 on 06/18/2021 Ophthalmology exam - never done Zoster vaccines [facility]) - never done DEXA SCAN -  never done Urine microalbumin - overdue  Foot exam - overdue  Covid-19 vaccine -  overdue Influenza vaccine - overdue  Star Rating Drugs: Metformin 553m - last filled on 07/06/2021 [phone]

## 2021-08-04 NOTE — Chronic Care Management (AMB) (Signed)
Called patient to let her know Accu chek strips will not be able to go with her order as they are not due til 08/25/2021 and will not be able to go through her insurance.  Patient states she has plenty and this will be fine.  Patient requested Colace to be added to her order. Deborah Wise with [facility] notified to add this to patients current order.

## 2021-08-05 ENCOUNTER — Ambulatory Visit
Admission: RE | Admit: 2021-08-05 | Discharge: 2021-08-05 | Disposition: A | Discharge status: Home / Self Care | Payer: Medicare Other | Source: Ambulatory Visit | Attending: Nurse Practitioner | Admitting: Nurse Practitioner

## 2021-08-05 DIAGNOSIS — K7460 Unspecified cirrhosis of liver: Secondary | ICD-10-CM | POA: Diagnosis not present

## 2021-08-05 DIAGNOSIS — K824 Cholesterolosis of gallbladder: Secondary | ICD-10-CM | POA: Diagnosis not present

## 2021-08-05 DIAGNOSIS — K7469 Other cirrhosis of liver: Secondary | ICD-10-CM

## 2021-08-27 ENCOUNTER — Other Ambulatory Visit: Payer: Self-pay | Admitting: Internal Medicine

## 2021-08-27 DIAGNOSIS — L2082 Flexural eczema: Secondary | ICD-10-CM

## 2021-08-31 ENCOUNTER — Emergency Department (HOSPITAL_COMMUNITY): Payer: Medicare Other

## 2021-08-31 ENCOUNTER — Inpatient Hospital Stay (HOSPITAL_COMMUNITY): Payer: Medicare Other

## 2021-08-31 ENCOUNTER — Telehealth: Payer: Self-pay | Admitting: Pharmacist

## 2021-08-31 ENCOUNTER — Encounter (HOSPITAL_COMMUNITY): Payer: Self-pay | Admitting: Critical Care Medicine

## 2021-08-31 DIAGNOSIS — Z95810 Presence of automatic (implantable) cardiac defibrillator: Secondary | ICD-10-CM | POA: Diagnosis not present

## 2021-08-31 DIAGNOSIS — I671 Cerebral aneurysm, nonruptured: Secondary | ICD-10-CM | POA: Diagnosis not present

## 2021-08-31 DIAGNOSIS — J80 Acute respiratory distress syndrome: Secondary | ICD-10-CM

## 2021-08-31 DIAGNOSIS — I214 Non-ST elevation (NSTEMI) myocardial infarction: Secondary | ICD-10-CM | POA: Diagnosis not present

## 2021-08-31 DIAGNOSIS — I480 Paroxysmal atrial fibrillation: Secondary | ICD-10-CM

## 2021-08-31 DIAGNOSIS — I499 Cardiac arrhythmia, unspecified: Secondary | ICD-10-CM | POA: Diagnosis not present

## 2021-08-31 DIAGNOSIS — R570 Cardiogenic shock: Secondary | ICD-10-CM | POA: Diagnosis not present

## 2021-08-31 DIAGNOSIS — I442 Atrioventricular block, complete: Secondary | ICD-10-CM

## 2021-08-31 DIAGNOSIS — J90 Pleural effusion, not elsewhere classified: Secondary | ICD-10-CM | POA: Diagnosis not present

## 2021-08-31 DIAGNOSIS — N320 Bladder-neck obstruction: Secondary | ICD-10-CM | POA: Diagnosis present

## 2021-08-31 DIAGNOSIS — I517 Cardiomegaly: Secondary | ICD-10-CM | POA: Diagnosis not present

## 2021-08-31 DIAGNOSIS — R0689 Other abnormalities of breathing: Secondary | ICD-10-CM | POA: Diagnosis not present

## 2021-08-31 DIAGNOSIS — F1411 Cocaine abuse, in remission: Secondary | ICD-10-CM | POA: Diagnosis present

## 2021-08-31 DIAGNOSIS — J690 Pneumonitis due to inhalation of food and vomit: Secondary | ICD-10-CM | POA: Diagnosis not present

## 2021-08-31 DIAGNOSIS — R918 Other nonspecific abnormal finding of lung field: Secondary | ICD-10-CM | POA: Diagnosis not present

## 2021-08-31 DIAGNOSIS — E1165 Type 2 diabetes mellitus with hyperglycemia: Secondary | ICD-10-CM | POA: Diagnosis present

## 2021-08-31 DIAGNOSIS — E878 Other disorders of electrolyte and fluid balance, not elsewhere classified: Secondary | ICD-10-CM | POA: Diagnosis present

## 2021-08-31 DIAGNOSIS — G931 Anoxic brain damage, not elsewhere classified: Secondary | ICD-10-CM | POA: Diagnosis not present

## 2021-08-31 DIAGNOSIS — I8510 Secondary esophageal varices without bleeding: Secondary | ICD-10-CM | POA: Diagnosis present

## 2021-08-31 DIAGNOSIS — I5041 Acute combined systolic (congestive) and diastolic (congestive) heart failure: Secondary | ICD-10-CM

## 2021-08-31 DIAGNOSIS — R34 Anuria and oliguria: Secondary | ICD-10-CM | POA: Diagnosis present

## 2021-08-31 DIAGNOSIS — N179 Acute kidney failure, unspecified: Secondary | ICD-10-CM | POA: Diagnosis present

## 2021-08-31 DIAGNOSIS — I110 Hypertensive heart disease with heart failure: Secondary | ICD-10-CM | POA: Diagnosis not present

## 2021-08-31 DIAGNOSIS — R0902 Hypoxemia: Secondary | ICD-10-CM | POA: Diagnosis not present

## 2021-08-31 DIAGNOSIS — D509 Iron deficiency anemia, unspecified: Secondary | ICD-10-CM

## 2021-08-31 DIAGNOSIS — E785 Hyperlipidemia, unspecified: Secondary | ICD-10-CM

## 2021-08-31 DIAGNOSIS — F1011 Alcohol abuse, in remission: Secondary | ICD-10-CM | POA: Diagnosis present

## 2021-08-31 DIAGNOSIS — Z8673 Personal history of transient ischemic attack (TIA), and cerebral infarction without residual deficits: Secondary | ICD-10-CM

## 2021-08-31 DIAGNOSIS — E872 Acidosis, unspecified: Secondary | ICD-10-CM | POA: Diagnosis present

## 2021-08-31 DIAGNOSIS — I462 Cardiac arrest due to underlying cardiac condition: Secondary | ICD-10-CM | POA: Diagnosis not present

## 2021-08-31 DIAGNOSIS — R4020 Unspecified coma: Secondary | ICD-10-CM | POA: Diagnosis not present

## 2021-08-31 DIAGNOSIS — R404 Transient alteration of awareness: Secondary | ICD-10-CM | POA: Diagnosis not present

## 2021-08-31 DIAGNOSIS — Z79890 Hormone replacement therapy (postmenopausal): Secondary | ICD-10-CM

## 2021-08-31 DIAGNOSIS — K7460 Unspecified cirrhosis of liver: Secondary | ICD-10-CM | POA: Diagnosis present

## 2021-08-31 DIAGNOSIS — E8721 Acute metabolic acidosis: Secondary | ICD-10-CM | POA: Diagnosis not present

## 2021-08-31 DIAGNOSIS — G9341 Metabolic encephalopathy: Secondary | ICD-10-CM | POA: Diagnosis not present

## 2021-08-31 DIAGNOSIS — Z66 Do not resuscitate: Secondary | ICD-10-CM | POA: Diagnosis not present

## 2021-08-31 DIAGNOSIS — Z20822 Contact with and (suspected) exposure to covid-19: Secondary | ICD-10-CM | POA: Diagnosis not present

## 2021-08-31 DIAGNOSIS — J811 Chronic pulmonary edema: Secondary | ICD-10-CM | POA: Diagnosis not present

## 2021-08-31 DIAGNOSIS — Z4682 Encounter for fitting and adjustment of non-vascular catheter: Secondary | ICD-10-CM | POA: Diagnosis not present

## 2021-08-31 DIAGNOSIS — E1140 Type 2 diabetes mellitus with diabetic neuropathy, unspecified: Secondary | ICD-10-CM | POA: Diagnosis not present

## 2021-08-31 DIAGNOSIS — I469 Cardiac arrest, cause unspecified: Principal | ICD-10-CM | POA: Diagnosis present

## 2021-08-31 DIAGNOSIS — R7401 Elevation of levels of liver transaminase levels: Secondary | ICD-10-CM | POA: Diagnosis present

## 2021-08-31 DIAGNOSIS — R55 Syncope and collapse: Secondary | ICD-10-CM | POA: Diagnosis not present

## 2021-08-31 DIAGNOSIS — I6523 Occlusion and stenosis of bilateral carotid arteries: Secondary | ICD-10-CM

## 2021-08-31 DIAGNOSIS — Z9911 Dependence on respirator [ventilator] status: Secondary | ICD-10-CM

## 2021-08-31 DIAGNOSIS — Z515 Encounter for palliative care: Secondary | ICD-10-CM

## 2021-08-31 DIAGNOSIS — I2510 Atherosclerotic heart disease of native coronary artery without angina pectoris: Secondary | ICD-10-CM | POA: Diagnosis present

## 2021-08-31 DIAGNOSIS — Z7982 Long term (current) use of aspirin: Secondary | ICD-10-CM

## 2021-08-31 DIAGNOSIS — Z8249 Family history of ischemic heart disease and other diseases of the circulatory system: Secondary | ICD-10-CM

## 2021-08-31 DIAGNOSIS — Z743 Need for continuous supervision: Secondary | ICD-10-CM | POA: Diagnosis not present

## 2021-08-31 DIAGNOSIS — B182 Chronic viral hepatitis C: Secondary | ICD-10-CM

## 2021-08-31 DIAGNOSIS — J9601 Acute respiratory failure with hypoxia: Secondary | ICD-10-CM | POA: Diagnosis not present

## 2021-08-31 DIAGNOSIS — I8500 Esophageal varices without bleeding: Secondary | ICD-10-CM

## 2021-08-31 DIAGNOSIS — I4891 Unspecified atrial fibrillation: Secondary | ICD-10-CM | POA: Diagnosis not present

## 2021-08-31 DIAGNOSIS — E871 Hypo-osmolality and hyponatremia: Secondary | ICD-10-CM | POA: Diagnosis not present

## 2021-08-31 DIAGNOSIS — K766 Portal hypertension: Secondary | ICD-10-CM | POA: Diagnosis not present

## 2021-08-31 DIAGNOSIS — Z79899 Other long term (current) drug therapy: Secondary | ICD-10-CM

## 2021-08-31 DIAGNOSIS — Z833 Family history of diabetes mellitus: Secondary | ICD-10-CM

## 2021-08-31 DIAGNOSIS — I219 Acute myocardial infarction, unspecified: Secondary | ICD-10-CM

## 2021-08-31 DIAGNOSIS — E119 Type 2 diabetes mellitus without complications: Secondary | ICD-10-CM

## 2021-08-31 DIAGNOSIS — Z8679 Personal history of other diseases of the circulatory system: Secondary | ICD-10-CM

## 2021-08-31 DIAGNOSIS — E1151 Type 2 diabetes mellitus with diabetic peripheral angiopathy without gangrene: Secondary | ICD-10-CM | POA: Diagnosis present

## 2021-08-31 DIAGNOSIS — Z7984 Long term (current) use of oral hypoglycemic drugs: Secondary | ICD-10-CM

## 2021-08-31 DIAGNOSIS — Z823 Family history of stroke: Secondary | ICD-10-CM

## 2021-08-31 DIAGNOSIS — R0600 Dyspnea, unspecified: Secondary | ICD-10-CM | POA: Diagnosis not present

## 2021-08-31 DIAGNOSIS — J9600 Acute respiratory failure, unspecified whether with hypoxia or hypercapnia: Secondary | ICD-10-CM | POA: Diagnosis present

## 2021-08-31 DIAGNOSIS — J9690 Respiratory failure, unspecified, unspecified whether with hypoxia or hypercapnia: Secondary | ICD-10-CM | POA: Diagnosis not present

## 2021-08-31 HISTORY — DX: Cardiac arrest, cause unspecified: I46.9

## 2021-08-31 HISTORY — DX: Cardiogenic shock: R57.0

## 2021-08-31 HISTORY — DX: Atrioventricular block, complete: I44.2

## 2021-08-31 LAB — BASIC METABOLIC PANEL
Anion gap: 10 (ref 5–15)
BUN: 13 mg/dL (ref 8–23)
CO2: 21 mmol/L — ABNORMAL LOW (ref 22–32)
Calcium: 8.7 mg/dL — ABNORMAL LOW (ref 8.9–10.3)
Chloride: 108 mmol/L (ref 98–111)
Creatinine, Ser: 1.25 mg/dL — ABNORMAL HIGH (ref 0.44–1.00)
GFR, Estimated: 47 mL/min — ABNORMAL LOW (ref >60)
Glucose, Bld: 316 mg/dL — ABNORMAL HIGH (ref 70–99)
Potassium: 3.2 mmol/L — ABNORMAL LOW (ref 3.5–5.1)
Sodium: 139 mmol/L (ref 135–145)

## 2021-08-31 LAB — GLUCOSE, CAPILLARY
Glucose-Capillary: 273 mg/dL — ABNORMAL HIGH (ref 70–99)
Glucose-Capillary: 244 mg/dL — ABNORMAL HIGH (ref 70–99)
Glucose-Capillary: 213 mg/dL — ABNORMAL HIGH (ref 70–99)
Glucose-Capillary: 231 mg/dL — ABNORMAL HIGH (ref 70–99)
Glucose-Capillary: 218 mg/dL — ABNORMAL HIGH (ref 70–99)

## 2021-08-31 LAB — URINALYSIS, ROUTINE W REFLEX MICROSCOPIC
Bilirubin Urine: NEGATIVE
Glucose, UA: >=500 mg/dL — AB
Ketones, ur: NEGATIVE mg/dL
Leukocytes,Ua: NEGATIVE
Nitrite: NEGATIVE
Protein, ur: 100 mg/dL — AB
Specific Gravity, Urine: 1.008 (ref 1.005–1.030)
pH: 6 (ref 5.0–8.0)

## 2021-08-31 LAB — AMMONIA: Ammonia: 101 umol/L — ABNORMAL HIGH (ref 9–35)

## 2021-08-31 LAB — CBC WITH DIFFERENTIAL/PLATELET
Abs Immature Granulocytes: 0.13 10*3/uL — ABNORMAL HIGH (ref 0.00–0.07)
Abs Immature Granulocytes: 0.08 10*3/uL — ABNORMAL HIGH (ref 0.00–0.07)
Basophils Absolute: 0 10*3/uL (ref 0.0–0.1)
Basophils Absolute: 0 10*3/uL (ref 0.0–0.1)
Basophils Relative: 0 %
Basophils Relative: 0 %
Eosinophils Absolute: 0 10*3/uL (ref 0.0–0.5)
Eosinophils Absolute: 0 10*3/uL (ref 0.0–0.5)
Eosinophils Relative: 1 %
Eosinophils Relative: 0 %
HCT: 39.7 % (ref 36.0–46.0)
HCT: 32 % — ABNORMAL LOW (ref 36.0–46.0)
Hemoglobin: 11.3 g/dL — ABNORMAL LOW (ref 12.0–15.0)
Hemoglobin: 10.4 g/dL — ABNORMAL LOW (ref 12.0–15.0)
Immature Granulocytes: 2 %
Immature Granulocytes: 1 %
Lymphocytes Relative: 60 %
Lymphocytes Relative: 4 %
Lymphs Abs: 4.8 10*3/uL — ABNORMAL HIGH (ref 0.7–4.0)
Lymphs Abs: 0.6 10*3/uL — ABNORMAL LOW (ref 0.7–4.0)
MCH: 24.8 pg — ABNORMAL LOW (ref 26.0–34.0)
MCH: 25.1 pg — ABNORMAL LOW (ref 26.0–34.0)
MCHC: 28.5 g/dL — ABNORMAL LOW (ref 30.0–36.0)
MCHC: 32.5 g/dL (ref 30.0–36.0)
MCV: 87.3 fL (ref 80.0–100.0)
MCV: 77.1 fL — ABNORMAL LOW (ref 80.0–100.0)
Monocytes Absolute: 0.3 10*3/uL (ref 0.1–1.0)
Monocytes Absolute: 0.7 10*3/uL (ref 0.1–1.0)
Monocytes Relative: 4 %
Monocytes Relative: 4 %
Neutro Abs: 2.7 10*3/uL (ref 1.7–7.7)
Neutro Abs: 15.6 10*3/uL — ABNORMAL HIGH (ref 1.7–7.7)
Neutrophils Relative %: 33 %
Neutrophils Relative %: 91 %
Platelets: 185 10*3/uL (ref 150–400)
Platelets: 111 10*3/uL — ABNORMAL LOW (ref 150–400)
RBC: 4.55 MIL/uL (ref 3.87–5.11)
RBC: 4.15 MIL/uL (ref 3.87–5.11)
RDW: 18.9 % — ABNORMAL HIGH (ref 11.5–15.5)
RDW: 17.9 % — ABNORMAL HIGH (ref 11.5–15.5)
WBC: 8 10*3/uL (ref 4.0–10.5)
WBC: 17 10*3/uL — ABNORMAL HIGH (ref 4.0–10.5)
nRBC: 3.6 % — ABNORMAL HIGH (ref 0.0–0.2)
nRBC: 0.2 % (ref 0.0–0.2)

## 2021-08-31 LAB — POCT I-STAT 7, (LYTES, BLD GAS, ICA,H+H)
Acid-base deficit: 5 mmol/L — ABNORMAL HIGH (ref 0.0–2.0)
Acid-base deficit: 9 mmol/L — ABNORMAL HIGH (ref 0.0–2.0)
Bicarbonate: 20 mmol/L (ref 20.0–28.0)
Bicarbonate: 14.2 mmol/L — ABNORMAL LOW (ref 20.0–28.0)
Calcium, Ion: 1.21 mmol/L (ref 1.15–1.40)
Calcium, Ion: 0.95 mmol/L — ABNORMAL LOW (ref 1.15–1.40)
HCT: 35 % — ABNORMAL LOW (ref 36.0–46.0)
HCT: 24 % — ABNORMAL LOW (ref 36.0–46.0)
Hemoglobin: 11.9 g/dL — ABNORMAL LOW (ref 12.0–15.0)
Hemoglobin: 8.2 g/dL — ABNORMAL LOW (ref 12.0–15.0)
O2 Saturation: 89 %
O2 Saturation: 99 %
Patient temperature: 97.9
Patient temperature: 97.9
Potassium: 3 mmol/L — ABNORMAL LOW (ref 3.5–5.1)
Potassium: 2.3 mmol/L — CL (ref 3.5–5.1)
Sodium: 137 mmol/L (ref 135–145)
Sodium: 144 mmol/L (ref 135–145)
TCO2: 21 mmol/L — ABNORMAL LOW (ref 22–32)
TCO2: 15 mmol/L — ABNORMAL LOW (ref 22–32)
pCO2 arterial: 36.1 mmHg (ref 32–48)
pCO2 arterial: 21.7 mmHg — ABNORMAL LOW (ref 32–48)
pH, Arterial: 7.349 — ABNORMAL LOW (ref 7.35–7.45)
pH, Arterial: 7.421 (ref 7.35–7.45)
pO2, Arterial: 58 mmHg — ABNORMAL LOW (ref 83–108)
pO2, Arterial: 130 mmHg — ABNORMAL HIGH (ref 83–108)

## 2021-08-31 LAB — LACTIC ACID, PLASMA
Lactic Acid, Venous: >9 mmol/L (ref 0.5–1.9)
Lactic Acid, Venous: 4.9 mmol/L (ref 0.5–1.9)

## 2021-08-31 LAB — I-STAT CHEM 8, ED
BUN: 12 mg/dL (ref 8–23)
Calcium, Ion: 1.12 mmol/L — ABNORMAL LOW (ref 1.15–1.40)
Chloride: 110 mmol/L (ref 98–111)
Creatinine, Ser: 1 mg/dL (ref 0.44–1.00)
Glucose, Bld: 399 mg/dL — ABNORMAL HIGH (ref 70–99)
HCT: 38 % (ref 36.0–46.0)
Hemoglobin: 12.9 g/dL (ref 12.0–15.0)
Potassium: 4.5 mmol/L (ref 3.5–5.1)
Sodium: 137 mmol/L (ref 135–145)
TCO2: 11 mmol/L — ABNORMAL LOW (ref 22–32)

## 2021-08-31 LAB — COMPREHENSIVE METABOLIC PANEL
ALT: 18 U/L (ref 0–44)
AST: 46 U/L — ABNORMAL HIGH (ref 15–41)
Albumin: 3 g/dL — ABNORMAL LOW (ref 3.5–5.0)
Alkaline Phosphatase: 79 U/L (ref 38–126)
Anion gap: 20 — ABNORMAL HIGH (ref 5–15)
BUN: 13 mg/dL (ref 8–23)
CO2: 8 mmol/L — ABNORMAL LOW (ref 22–32)
Calcium: 8.9 mg/dL (ref 8.9–10.3)
Chloride: 109 mmol/L (ref 98–111)
Creatinine, Ser: 1.26 mg/dL — ABNORMAL HIGH (ref 0.44–1.00)
GFR, Estimated: 46 mL/min — ABNORMAL LOW (ref >60)
Glucose, Bld: 396 mg/dL — ABNORMAL HIGH (ref 70–99)
Potassium: 4.6 mmol/L (ref 3.5–5.1)
Sodium: 137 mmol/L (ref 135–145)
Total Bilirubin: 1 mg/dL (ref 0.3–1.2)
Total Protein: 6.3 g/dL — ABNORMAL LOW (ref 6.5–8.1)

## 2021-08-31 LAB — RAPID URINE DRUG SCREEN, HOSP PERFORMED
Amphetamines: NOT DETECTED
Barbiturates: NOT DETECTED
Benzodiazepines: NOT DETECTED
Cocaine: NOT DETECTED
Opiates: NOT DETECTED
Tetrahydrocannabinol: NOT DETECTED

## 2021-08-31 LAB — I-STAT ARTERIAL BLOOD GAS, ED
Acid-base deficit: 14 mmol/L — ABNORMAL HIGH (ref 0.0–2.0)
Bicarbonate: 14.3 mmol/L — ABNORMAL LOW (ref 20.0–28.0)
Calcium, Ion: 1.21 mmol/L (ref 1.15–1.40)
HCT: 36 % (ref 36.0–46.0)
Hemoglobin: 12.2 g/dL (ref 12.0–15.0)
O2 Saturation: 94 %
Potassium: 3.5 mmol/L (ref 3.5–5.1)
Sodium: 138 mmol/L (ref 135–145)
TCO2: 16 mmol/L — ABNORMAL LOW (ref 22–32)
pCO2 arterial: 40.1 mmHg (ref 32–48)
pH, Arterial: 7.16 — CL (ref 7.35–7.45)
pO2, Arterial: 90 mmHg (ref 83–108)

## 2021-08-31 LAB — PROTIME-INR
INR: 1.2 (ref 0.8–1.2)
Prothrombin Time: 15.1 seconds (ref 11.4–15.2)

## 2021-08-31 LAB — PROCALCITONIN: Procalcitonin: 1.04 ng/mL

## 2021-08-31 LAB — ECHOCARDIOGRAM COMPLETE
AR max vel: 2.75 cm2
AV Area VTI: 2.16 cm2
AV Area mean vel: 2.67 cm2
AV Mean grad: 5 mmHg
AV Peak grad: 9.6 mmHg
Ao pk vel: 1.55 m/s
Area-P 1/2: 5.27 cm2
Calc EF: 30.8 %
S' Lateral: 3.9 cm
Single Plane A2C EF: 37 %
Single Plane A4C EF: 18.9 %
Weight: 2720 oz

## 2021-08-31 LAB — CBG MONITORING, ED: Glucose-Capillary: 243 mg/dL — ABNORMAL HIGH (ref 70–99)

## 2021-08-31 LAB — RESP PANEL BY RT-PCR (FLU A&B, COVID) ARPGX2
Influenza A by PCR: NEGATIVE
Influenza B by PCR: NEGATIVE
SARS Coronavirus 2 by RT PCR: NEGATIVE

## 2021-08-31 LAB — TROPONIN I (HIGH SENSITIVITY)
Troponin I (High Sensitivity): 95 ng/L — ABNORMAL HIGH (ref <18)
Troponin I (High Sensitivity): 797 ng/L (ref <18)
Troponin I (High Sensitivity): 3883 ng/L (ref <18)

## 2021-08-31 LAB — HEPARIN LEVEL (UNFRACTIONATED): Heparin Unfractionated: 0.53 IU/mL (ref 0.30–0.70)

## 2021-08-31 LAB — D-DIMER, QUANTITATIVE: D-Dimer, Quant: 3.26 ug/mL-FEU — ABNORMAL HIGH (ref 0.00–0.50)

## 2021-08-31 LAB — HIV ANTIBODY (ROUTINE TESTING W REFLEX): HIV Screen 4th Generation wRfx: NONREACTIVE

## 2021-08-31 LAB — MRSA NEXT GEN BY PCR, NASAL: MRSA by PCR Next Gen: NOT DETECTED

## 2021-08-31 LAB — BETA-HYDROXYBUTYRIC ACID: Beta-Hydroxybutyric Acid: 0.22 mmol/L (ref 0.05–0.27)

## 2021-08-31 MED ORDER — NOREPINEPHRINE 4 MG/250ML-% IV SOLN
0.0000 ug/min | INTRAVENOUS | Status: DC
  Administered 2021-08-31: 2 ug/min via INTRAVENOUS
  Filled 2021-08-31: qty 250

## 2021-08-31 MED ORDER — IPRATROPIUM-ALBUTEROL 0.5-2.5 (3) MG/3ML IN SOLN
3.0000 mL | RESPIRATORY_TRACT | Status: DC
  Administered 2021-08-31 – 2021-09-04 (×25): 3 mL via RESPIRATORY_TRACT
  Filled 2021-08-31 (×25): qty 3

## 2021-08-31 MED ORDER — SODIUM BICARBONATE 8.4 % IV SOLN
100.0000 meq | Freq: Once | INTRAVENOUS | Status: DC
Start: 2021-08-31 — End: 2021-09-01

## 2021-08-31 MED ORDER — ORAL CARE MOUTH RINSE
15.0000 mL | OROMUCOSAL | Status: DC
  Administered 2021-08-31 – 2021-09-04 (×37): 15 mL via OROMUCOSAL

## 2021-08-31 MED ORDER — DOCUSATE SODIUM 50 MG/5ML PO LIQD
100.0000 mg | Freq: Two times a day (BID) | ORAL | Status: DC | PRN
Start: 2021-08-31 — End: 2021-09-04

## 2021-08-31 MED ORDER — FENTANYL CITRATE PF 50 MCG/ML IJ SOSY: 25.0000 ug | PREFILLED_SYRINGE | INTRAMUSCULAR | Status: DC | PRN

## 2021-08-31 MED ORDER — POLYETHYLENE GLYCOL 3350 17 G PO PACK
17.0000 g | PACK | Freq: Every day | ORAL | Status: DC
  Administered 2021-08-31 – 2021-09-01 (×2): 17 g
  Filled 2021-08-31 (×3): qty 1

## 2021-08-31 MED ORDER — FENTANYL CITRATE PF 50 MCG/ML IJ SOSY
25.0000 ug | PREFILLED_SYRINGE | INTRAMUSCULAR | Status: DC | PRN
  Administered 2021-08-31: 100 ug via INTRAVENOUS
  Administered 2021-08-31 – 2021-09-01 (×5): 50 ug via INTRAVENOUS
  Filled 2021-08-31 (×2): qty 1

## 2021-08-31 MED ORDER — ACETAMINOPHEN 160 MG/5ML PO SOLN
ORAL | Status: AC
  Administered 2021-08-31: 650 mg
  Filled 2021-08-31: qty 20.3

## 2021-08-31 MED ORDER — DOCUSATE SODIUM 50 MG/5ML PO LIQD
100.0000 mg | Freq: Two times a day (BID) | ORAL | Status: DC
  Administered 2021-08-31 – 2021-09-01 (×4): 100 mg
  Filled 2021-08-31 (×5): qty 10

## 2021-08-31 MED ORDER — POTASSIUM CHLORIDE 10 MEQ/50ML IV SOLN: 10.0000 meq | INTRAVENOUS | Status: DC

## 2021-08-31 MED ORDER — ATROPINE SULFATE 1 MG/ML IV SOLN
INTRAVENOUS | Status: AC | PRN
  Administered 2021-08-31: 1 mg via INTRAVENOUS

## 2021-08-31 MED ORDER — DOCUSATE SODIUM 100 MG PO CAPS: 100.0000 mg | ORAL_CAPSULE | Freq: Two times a day (BID) | ORAL | Status: DC | PRN

## 2021-08-31 MED ORDER — EPINEPHRINE 1 MG/10ML IJ SOSY
PREFILLED_SYRINGE | INTRAMUSCULAR | Status: AC | PRN
  Administered 2021-08-31: 1 mg via INTRAVENOUS

## 2021-08-31 MED ORDER — ACETAMINOPHEN 160 MG/5ML PO SOLN
650.0000 mg | ORAL | Status: DC | PRN
Start: 2021-08-31 — End: 2021-09-04
  Administered 2021-09-02 – 2021-09-03 (×2): 650 mg
  Filled 2021-08-31 (×2): qty 20.3

## 2021-08-31 MED ORDER — FENTANYL CITRATE PF 50 MCG/ML IJ SOSY
25.0000 ug | PREFILLED_SYRINGE | INTRAMUSCULAR | Status: DC | PRN
  Filled 2021-08-31 (×2): qty 1
  Filled 2021-08-31: qty 2
  Filled 2021-08-31 (×2): qty 1

## 2021-08-31 MED ORDER — CHLORHEXIDINE GLUCONATE 0.12% ORAL RINSE (MEDLINE KIT)
15.0000 mL | Freq: Two times a day (BID) | OROMUCOSAL | Status: DC
  Administered 2021-08-31 – 2021-09-04 (×9): 15 mL via OROMUCOSAL

## 2021-08-31 MED ORDER — ASPIRIN 300 MG RE SUPP: 300.0000 mg | RECTAL | Status: AC

## 2021-08-31 MED ORDER — POTASSIUM CHLORIDE 20 MEQ PO PACK
40.0000 meq | PACK | Freq: Once | ORAL | Status: AC
  Administered 2021-08-31: 40 meq
  Filled 2021-08-31: qty 2

## 2021-08-31 MED ORDER — ASPIRIN 81 MG PO CHEW
324.0000 mg | CHEWABLE_TABLET | ORAL | Status: AC
  Administered 2021-08-31: 324 mg
  Filled 2021-08-31: qty 4

## 2021-08-31 MED ORDER — STERILE WATER FOR INJECTION IV SOLN
INTRAVENOUS | Status: DC
  Filled 2021-08-31 (×4): qty 1000

## 2021-08-31 MED ORDER — HEPARIN (PORCINE) 25000 UT/250ML-% IV SOLN
1250.0000 [IU]/h | INTRAVENOUS | Status: DC
  Administered 2021-08-31 – 2021-09-01 (×2): 1000 [IU]/h via INTRAVENOUS
  Administered 2021-09-02: 1150 [IU]/h via INTRAVENOUS
  Administered 2021-09-03: 1300 [IU]/h via INTRAVENOUS
  Filled 2021-08-31 (×5): qty 250

## 2021-08-31 MED ORDER — POTASSIUM CHLORIDE 10 MEQ/50ML IV SOLN
10.0000 meq | INTRAVENOUS | Status: AC
  Administered 2021-08-31 (×4): 10 meq via INTRAVENOUS
  Filled 2021-08-31 (×4): qty 50

## 2021-08-31 MED ORDER — IOHEXOL 350 MG/ML SOLN
100.0000 mL | Freq: Once | INTRAVENOUS | Status: AC | PRN
  Administered 2021-08-31: 100 mL via INTRAVENOUS

## 2021-08-31 MED ORDER — CHLORHEXIDINE GLUCONATE CLOTH 2 % EX PADS
6.0000 | MEDICATED_PAD | Freq: Every day | CUTANEOUS | Status: DC
Start: 2021-09-01 — End: 2021-09-04
  Administered 2021-08-31 – 2021-09-04 (×5): 6 via TOPICAL

## 2021-08-31 MED ORDER — SUCCINYLCHOLINE CHLORIDE 20 MG/ML IJ SOLN
INTRAMUSCULAR | Status: AC | PRN
  Administered 2021-08-31: 100 mg via INTRAVENOUS

## 2021-08-31 MED ORDER — EPINEPHRINE HCL 5 MG/250ML IV SOLN IN NS
0.5000 ug/min | INTRAVENOUS | Status: DC
  Administered 2021-08-31: 5 ug/min via INTRAVENOUS

## 2021-08-31 MED ORDER — PROPOFOL 1000 MG/100ML IV EMUL
0.0000 ug/kg/min | INTRAVENOUS | Status: DC
  Administered 2021-08-31: 30 ug/kg/min via INTRAVENOUS
  Administered 2021-08-31: 40 ug/kg/min via INTRAVENOUS
  Administered 2021-08-31: 20 ug/kg/min via INTRAVENOUS
  Administered 2021-09-01: 45 ug/kg/min via INTRAVENOUS
  Filled 2021-08-31 (×2): qty 100
  Filled 2021-08-31: qty 200
  Filled 2021-08-31: qty 100

## 2021-08-31 MED ORDER — AMIODARONE HCL IN DEXTROSE 360-4.14 MG/200ML-% IV SOLN
30.0000 mg/h | INTRAVENOUS | Status: DC
  Administered 2021-08-31 – 2021-09-04 (×7): 30 mg/h via INTRAVENOUS
  Filled 2021-08-31 (×8): qty 200

## 2021-08-31 MED ORDER — INSULIN ASPART 100 UNIT/ML IJ SOLN
2.0000 [IU] | INTRAMUSCULAR | Status: DC
  Administered 2021-08-31 (×4): 6 [IU] via SUBCUTANEOUS
  Administered 2021-09-01: 4 [IU] via SUBCUTANEOUS
  Administered 2021-09-01: 2 [IU] via SUBCUTANEOUS
  Administered 2021-09-01: 6 [IU] via SUBCUTANEOUS
  Administered 2021-09-01 (×2): 4 [IU] via SUBCUTANEOUS
  Administered 2021-09-02: 2 [IU] via SUBCUTANEOUS
  Administered 2021-09-02 – 2021-09-03 (×6): 4 [IU] via SUBCUTANEOUS
  Administered 2021-09-03: 6 [IU] via SUBCUTANEOUS
  Administered 2021-09-03 (×3): 4 [IU] via SUBCUTANEOUS
  Administered 2021-09-03: 6 [IU] via SUBCUTANEOUS
  Administered 2021-09-03 – 2021-09-04 (×3): 4 [IU] via SUBCUTANEOUS

## 2021-08-31 MED ORDER — SODIUM CHLORIDE 0.9 % IV SOLN
3.0000 g | Freq: Three times a day (TID) | INTRAVENOUS | Status: DC
  Administered 2021-08-31 – 2021-09-03 (×9): 3 g via INTRAVENOUS
  Filled 2021-08-31 (×12): qty 8

## 2021-08-31 MED ORDER — HEPARIN BOLUS VIA INFUSION
4000.0000 [IU] | Freq: Once | INTRAVENOUS | Status: AC
  Administered 2021-08-31: 4000 [IU] via INTRAVENOUS
  Filled 2021-08-31: qty 4000

## 2021-08-31 MED ORDER — AMIODARONE HCL IN DEXTROSE 360-4.14 MG/200ML-% IV SOLN
60.0000 mg/h | INTRAVENOUS | Status: AC
Start: 2021-08-31 — End: 2021-08-31
  Administered 2021-08-31 (×2): 60 mg/h via INTRAVENOUS
  Filled 2021-08-31 (×2): qty 200

## 2021-08-31 MED ORDER — CALCIUM GLUCONATE-NACL 1-0.675 GM/50ML-% IV SOLN
1.0000 g | Freq: Once | INTRAVENOUS | Status: AC
  Administered 2021-08-31: 1000 mg via INTRAVENOUS
  Filled 2021-08-31: qty 50

## 2021-08-31 MED ORDER — PANTOPRAZOLE 2 MG/ML SUSPENSION
40.0000 mg | Freq: Every day | ORAL | Status: DC
  Administered 2021-08-31 – 2021-09-04 (×5): 40 mg
  Filled 2021-08-31 (×6): qty 20

## 2021-08-31 MED ORDER — LABETALOL HCL 5 MG/ML IV SOLN: 10.0000 mg | INTRAVENOUS | Status: DC | PRN

## 2021-08-31 MED ORDER — POLYETHYLENE GLYCOL 3350 17 G PO PACK: 17.0000 g | PACK | Freq: Every day | ORAL | Status: DC | PRN

## 2021-08-31 MED ORDER — AMIODARONE LOAD VIA INFUSION
150.0000 mg | Freq: Once | INTRAVENOUS | Status: AC
Start: 2021-08-31 — End: 2021-08-31
  Administered 2021-08-31: 150 mg via INTRAVENOUS
  Filled 2021-08-31: qty 83.34

## 2021-08-31 NOTE — Progress Notes (Signed)
?  Echocardiogram ?2D Echocardiogram has been performed. ? ?Roseanna Rainbow R ?08/08/2021, 11:11 AM ?

## 2021-08-31 NOTE — Progress Notes (Signed)
eLink Physician-Brief Progress Note ?Patient Name: Deborah Wise ?DOB: 1950-11-15 ?MRN: [id number] ? ? ?Date of Service ? 09/01/2021  ?HPI/Events of Note ? BP  100/46, MAP 62.  ?eICU Interventions ? Norepinephrine gtt 0-10 mcg ordered to be started for MAP < 60 or SBP < 90.  ? ? ? ?  ? ?Kerry Kass Rakiyah Esch ?08/15/2021, 10:29 PM ?

## 2021-08-31 NOTE — Consult Note (Signed)
.  ANTICOAGULATION CONSULT NOTE - Initial Consult ? ?Pharmacy Consult for Heparin dosing ?Indication: ACS ? ?No Known Allergies ? ?Patient Measurements: ?Weight: 77.1 kg (170 lb) ?Heparin Dosing Weight: 70.9kg ? ?Vital Signs: ?BP: 147/68 (03/27 0800) ?Pulse Rate: 84 (03/27 0746) ? ?Labs: ?Recent Labs  ?  08/16/2021 ?0800 08/23/2021 ?0801 08/17/2021 ?9233  ?HGB 12.9 11.3* 12.2  ?HCT 38.0 39.7 36.0  ?PLT  --  185  --   ?LABPROT  --  15.1  --   ?INR  --  1.2  --   ?CREATININE 1.00 1.26*  --   ?TROPONINIHS  --  95*  --   ? ? ?Estimated Creatinine Clearance: 42.4 mL/min (A) (by C-G formula based on SCr of 1.26 mg/dL (H)). ? ? ?Medical History: ?Past Medical History:  ?Diagnosis Date  ? Bilateral carotid artery stenosis   ? vascular-- Dagoberto Ligas PA --- last duplex in epic 10-06-2020 right ICA 60-79% and left ICA 40-59%  ? Bladder outlet obstruction   ? self caths  ? Bladder wall thickening   ? Blood transfusion without reported diagnosis   ? Coronary artery disease   ? cardiologist--- dr Gwenlyn Found---- cath 03-25-2020 showed proxRCA 75%, distal RCA 50%, and midLAD 60%,  managed medically  ? Detrusor overactivity   ? urinary bladder  ? Esophageal varices ([facility])   ? followed by dr Henrene Pastor---  nonbleeding  ? Fibroid, uterine   ? Full dentures   ? Hepatic cirrhosis due to chronic hepatitis C infection ([place])   ? followed by [place] NP---  s/p treated chronic hep c with cure and complicated by portal hypertension and esophageal varies  ? History of CVA (cerebrovascular accident) without residual deficits 06/2016  ? neurologist--- Frann Rider---  hospital admission 06-27-2016 right [place] infart with  left MCA aneurysm,  no residual per pt  ? History of hepatitis C   ? post treatement and cured  ? Hyperlipidemia   ? Hypertension   ? IDA (iron deficiency anemia)   ? 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 left MCA ;  followed by neurologist  ? Peripheral vascular disease ([place])   ? Self-catheterizes urinary bladder    ? Stroke [facility]) 2018  ? Thrombocytopenia ([place]) 12/25/2014  ? Type 2 diabetes mellitus ([place])   ? followed by pcp---- (01-14-2021 pt stated check blood sugar at home twice weekly fasting, did not know average)  ? Vaginal atrophy   ? ?Assessment: ?49 YOF brought in by EMS with acute onset SOB and subsequent cardiac arrest after arrival to ED. ACLS performed ~10 minutes before ROSC. Pharmacy has been consulted to dose for possible ACS.  ? ?PMH significant for cirrhosis, esophageal varices (no GIB), CVA, CAD, DM, HTN, HLD, PVD, non-ruptured cerebral aneurysm neuroendocrine tumor, and EtOH+cocaine abuse ? ?Hgb 12.2, Plts 185 ? ?Goal of Therapy:  ?Heparin level 0.3-0.7 units/ml ?Monitor platelets by anticoagulation protocol: Yes ?  ?Plan:  ? ?Give 4000 unit IV bolus x 1 ?Start Heparin infusion at 1000 units/hr ?Check Heparin level in 6 hours ?Continue to monitor CBC ? ?Deborah Wise ?PharmD Candidate ?08/18/2021,11:03 AM ? ? ?

## 2021-08-31 NOTE — ED Triage Notes (Signed)
Pt via EMS-had acute onset of sob after waking up and eating breakfast this morning. FD reports SpO2 of 30% and being alert only to painful stimuli. Placed on NRB. Family reports she's had a similar episode before where her throat has "closed up." EMS attempted CPAP but patient ripped it off. 2g mag, 125 mg solumedrol given by EMS.  ?

## 2021-08-31 NOTE — Progress Notes (Signed)
Son, 2 grandchildren updated at bedside this afternoon. ? ?Julian Hy, DO 08/17/2021 4:37 PM ?[facility] ? ?

## 2021-08-31 NOTE — Consult Note (Signed)
[name] KitchenANTICOAGULATION CONSULT NOTE - Initial Consult ? ?Pharmacy Consult for Heparin dosing ?Indication: ACS ? ?No Known Allergies ? ?Patient Measurements: ?Height: '5\' 4"'$  (162.6 cm) ?Weight: 77.1 kg (169 lb 15.6 oz) ?IBW/kg (Calculated) : 54.7 ?Heparin Dosing Weight: 70.9kg ? ?Vital Signs: ?Temp: 99.3 ?F (37.4 ?C) (03/27 1800) ?Temp Source: Bladder (03/27 1600) ?BP: 94/57 (03/27 1800) ?Pulse Rate: 78 (03/27 1800) ? ?Labs: ?Recent Labs  ?  08/05/2021 ?0800 08/12/2021 ?0801 08/16/2021 ?2878 08/09/2021 ?1033 08/20/2021 ?1146 08/30/2021 ?1524 08/13/2021 ?1525 08/27/2021 ?1827  ?HGB 12.9 11.3* 12.2  --  11.9*  --  8.2*  --   ?HCT 38.0 39.7 36.0  --  35.0*  --  24.0*  --   ?PLT  --  185  --   --   --   --   --   --   ?LABPROT  --  15.1  --   --   --   --   --   --   ?INR  --  1.2  --   --   --   --   --   --   ?HEPARINUNFRC  --   --   --   --   --   --   --  0.53  ?CREATININE 1.00 1.26*  --  1.25*  --   --   --   --   ?TROPONINIHS  --  95*  --  797*  --  3,883*  --   --   ? ? ?Estimated Creatinine Clearance: 42.7 mL/min (A) (by C-G formula based on SCr of 1.25 mg/dL (H)). ? ? ?Medical History: ?Past Medical History:  ?Diagnosis Date  ? Bilateral carotid artery stenosis   ? vascular-- Dagoberto Ligas PA --- last duplex in epic 10-06-2020 right ICA 60-79% and left ICA 40-59%  ? Bladder outlet obstruction   ? self caths  ? Bladder wall thickening   ? Blood transfusion without reported diagnosis   ? Coronary artery disease   ? cardiologist--- dr Gwenlyn Found---- cath 03-25-2020 showed proxRCA 75%, distal RCA 50%, and midLAD 60%,  managed medically  ? Detrusor overactivity   ? urinary bladder  ? Esophageal varices (H[facility])   ? followed by dr Henrene Pastor---  nonbleeding  ? Fibroid, uterine   ? Full dentures   ? Hepatic cirrhosis due to chronic hepatitis C infection ([place])   ? followed by [place] NP---  s/p treated chronic hep c with cure and complicated by portal hypertension and esophageal varies  ? History of CVA (cerebrovascular accident)  without residual deficits 06/2016  ? neurologist--- Frann Rider---  hospital admission 06-27-2016 right [place] infart with  left MCA aneurysm,  no residual per pt  ? History of hepatitis C   ? post treatement and cured  ? Hyperlipidemia   ? Hypertension   ? IDA (iron deficiency anemia)   ? 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 left MCA ;  followed by neurologist  ? Peripheral vascular disease ([place])   ? Self-catheterizes urinary bladder   ? Stroke [facility]) 2018  ? Thrombocytopenia ([place]) 12/25/2014  ? Type 2 diabetes mellitus ([place])   ? followed by pcp---- (01-14-2021 pt stated check blood sugar at home twice weekly fasting, did not know average)  ? Vaginal atrophy   ? ?Assessment: ?49 YOF brought in by EMS with acute onset SOB and subsequent cardiac arrest after arrival to ED. ACLS performed ~10 minutes before ROSC. PMH significant for cirrhosis, esophageal varices (no GIB), CVA,  CAD, DM, HTN, HLD, PVD, non-ruptured cerebral aneurysm neuroendocrine tumor, and EtOH+cocaine abuse. No anticoagulation prior to admission. Pharmacy consulted for heparin.   ? ? ?Heparin level 0.53 is therapeutic on 1000 units/hr. ? ? ?Goal of Therapy:  ?Heparin level 0.3-0.7 units/ml ?Monitor platelets by anticoagulation protocol: Yes ?  ?Plan:  ?Continue Heparin infusion at 1000 units/hr ?Monitor daily heparin level, CBC ?Monitor for signs/symptoms of bleeding  ? ?Benetta Spar, PharmD, BCPS, BCCP ?Clinical Pharmacist ? ?Please check AMION for all [facility] phone numbers ?After 10:00 PM, call [place] [phone] ? ?

## 2021-08-31 NOTE — Progress Notes (Signed)
Pharmacy Antibiotic Note ? ?Deborah Wise is a 71 y.o. female presented on 08/23/2021 with SOB to ED and subsequent cardiac  arrest in ED.  Pharmacy has been consulted for Unasyn dosing for aspiration PNA.  ? ?Plan: ?Start Unasyn 3g IV q8h ?Monitor renal function, cultures, and clinical progression  ? ?Weight: 77.1 kg (170 lb) ? ?No data recorded. ? ?Recent Labs  ?Lab 08/08/2021 ?0748 09/01/2021 ?0800 08/23/2021 ?0801  ?WBC  --   --  8.0  ?CREATININE  --  1.00 1.26*  ?LATICACIDVEN >9.0*  --   --   ?  ?Estimated Creatinine Clearance: 42.4 mL/min (A) (by C-G formula based on SCr of 1.26 mg/dL (H)).   ? ?No Known Allergies ? ?Antimicrobials this admission: ?Unasyn 3/27>> ? ?Dose adjustments this admission: ? ? ?Microbiology results: ?3/27 bcx:  ? ?Thank you for allowing pharmacy to be a part of this patient?s care. ? ?Cristela Felt, PharmD, BCPS ?Clinical Pharmacist ?08/30/2021 9:42 AM ? ? ?

## 2021-08-31 NOTE — ED Provider Notes (Addendum)
[facility] ? ?Date/Time: 08/06/2021 8:43 AM ?Performed by: Garald Balding, PA-C ?Authorized by: Garald Balding, PA-C  ? ?Consent:  ?  Consent obtained:  Emergent situation ?Universal protocol:  ?  Patient identity confirmed:  Arm band ?Pre-procedure details:  ?  Skin preparation:  Chlorhexidine ?  Preparation: Patient was prepped and draped in sterile fashion   ?Sedation:  ?  Sedation type:  Deep ?Anesthesia:  ?  Anesthesia method:  Local infiltration ?  Local anesthetic:  Lidocaine 1% w/o epi ?Procedure details:  ?  Location:  R radial ?  Allen's test performed: yes   ?  Allen's test abnormal: yes   ?  Needle gauge:  18 G ?  Placement technique:  Ultrasound guided ?  Transducer: waveform confirmed   ?Post-procedure details:  ?  Post-procedure:  Biopatch applied, sterile dressing applied and sutured ?  CMS:  Normal ?  Procedure completion:  Tolerated well, no immediate complications ? ? ? ?  ?Garald Balding, PA-C ?08/10/2021 0623 ? ?  ?Pattricia Boss, MD ?09/01/21 [phone] ? ?

## 2021-08-31 NOTE — Progress Notes (Signed)
Report received from ED RN, Charise Carwin at [phone]. Pt will be taken to CT for CT head and CTA, and then will be brought to 2H25. ?

## 2021-08-31 NOTE — H&P (Signed)
? ?NAME:  MAEOLA MCHANEY, MRN:  [id number], DOB:  June 17, 1950, LOS: 0 ?ADMISSION DATE:  08/27/2021, CONSULTATION DATE:  3/27 ?REFERRING MD:  Alvino Chapel, CHIEF COMPLAINT:  cardiac arrest   ? ?History of Present Illness:  ?71 year old female patient with extensive history as outlined below.  Presented to the emergency room on 3/27 status post cardiac arrest.  Per report he was eating breakfast the morning of 3/27, developed rather acute onset of shortness of breath for which EMS was summoned.  On fire department arrival pulse oximetry in the 30s and only responding to painful stimulus.  Initially attempted CPAP but patient ripped it off.  He was given magnesium and Solu-Medrol by EMS given prior history of anaphylaxis.  Upon arrival to the emergency room was bradycardic and subsequently went into cardiac arrest ACLS algorithm initiated she was intubated, received epinephrine and atropine, initially cardiac rhythm appeared to be third-degree heart block.  Post resuscitation patient was with narrow complex regular tachycardia, with mild ST elevation in inferior leads. PCCM asked to admit. Cardiology at bedside  ? ? 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  ?CAD ?DM w/ diabetic neuropathy  ?Colon polyps ?HTN  ?Carotid artery disease ?PVD ?Neurogenic bladder  ?Neuroendocrine tumor (documented in transplant notes.) ? ? ?Prior tubal ligation  ? ?Significant Hospital Events: ?Including procedures, antibiotic start and stop dates in addition to other pertinent events   ?3/27, EMS called.  Hypoxic on arrival and 30s.  Bradycardic and unresponsive, third-degree heart block and cardiac arrest on arrival to ER.  Time to return of spontaneous circulation estimated at 10 minutes.  Possible inferior wall ST elevation.  Intubated.   Right femoral central line placed, right radial A-line placed.  ? ?Interim History / Subjective:  ?Minimally responsive postarrest, currently on norepinephrine ? ?Objective   ?Blood pressure (Abnormal) 147/68, pulse 84, resp. rate 17, SpO2 99 %. ?   ?Vent Mode: PRVC ?FiO2 (%):  [100 %] 100 % ?Set Rate:  [18 bmp] 18 bmp ?Vt Set:  [430 mL] 430 mL ?PEEP:  [5 cmH20] 5 cmH20 ?Plateau Pressure:  [22 cmH20] 22 cmH20  ?No intake or output data in the 24 hours ending 09/01/2021 0847 ?There were no vitals filed for this visit. ? ?Examination: ?General: 71 year old female, now exhibiting cough to suction following cardiac arrest ?HENT: Normocephalic atraumatic pupils sluggish but reactive ?Lungs: Diffuse rhonchi and wheezing ?Cardiovascular: Regular rate and rhythm ?Abdomen: Soft not tender ?Extremities: Warm dry pulses palpable ?Neuro: Will cough with suction attempt, no movement otherwise ?GU: Due to void ? ?Resolved Hospital Problem list   ? ? ?Assessment & Plan:  ?Principal Problem: ?  Cardiac arrest ([place]) ?Active Problems: ?  Atrial fibrillation with RVR ([place]) ?  CHB (complete heart block) (HCC) ?  Cardiogenic shock ([place]) ?  History of diastolic dysfunction ?  Acute respiratory failure ([place]) ?  Acute metabolic encephalopathy ?  Metabolic acidosis ?  AKI (acute kidney injury) ([place]) ?  Alteration in electrolyte and fluid balance ?  Hepatic cirrhosis due to chronic hepatitis C infection ([place]) ?  Bilateral carotid artery stenosis ?  Esophageal varices (HCC) ?  History of CVA (cerebrovascular accident) without residual deficits ?  IDA (iron deficiency anemia) ?  Type 2 diabetes mellitus ([place]) ?  Hyperlipidemia ? ? ? ? ? ?Cardiac arrest: etiology not  clear. ? Cardiac ? PE given initial hypoxia ?Plan ?Stat CT Chest and head ?Cycle CEs ?Repeating 12 lead ?Cardiology at bedside; LV looks reduced. Deciding on timing of cardiac cath.  ? ?AF w/ RVR. Did have brief CHB. Rhythm has been rapid and irregular since ROSC, has History of  coronary artery disease, hypertension, and peripheral vascular disease. ?Plan ?Tele ?Will consider further amio  ?Will need systemic AC pending CT head imaging ?Aspirin to start following CT head rule out ? ?Cardiogenic shock s/p cardiac arrest, question possible pulmonary emboli ?Plan ?Tele  ?Hold home hydrochlorothiazide, and amlodipine. ?Crystalloid bolus ?Formal echocardiogram ?Titrate norepinephrine for mean arterial pressure greater than 65 ?Ensure euvolemia ? ?Acute hypoxic respiratory failure.  Portable chest x-ray with diffuse bilateral airspace disease which could reflect aspiration but also pulmonary edema ?Plan ?Full ventilator support ?Up titrate PEEP as tolerated ?VAP bundle ?Serial BNP ?Respiratory culture ?Procalcitonin ?Consider diuresis when hemodynamically able ?IV Unasyn ?A.m. chest x-ray ?Follow-up ABG later today and again in the morning ? ?Acute metabolic encephalopathy status post cardiac arrest ?She does have cough, when suctioned.  Downtime 10 minutes however arrest was witnessed ?Plan ?CT head ?Avoiding fever, start TTM ?Supportive care ? ?Acute metabolic acidosis, secondary to severe lactic acidosis likely reflecting postcardiac arrest/CPR ?Plan ?Continue resuscitation efforts ?Hemodynamic support ?Follow-up lactate ?Check beta hydroxybutyric acid level ?Note given cirrhosis lactate may clear slowly ? ?AKI ?Plan ?Supportive care ?Holding diuretics ?Mean arterial pressure goal greater than 65 ?Strict intake output ?Serial chemistries ? ? ?Fluid and electrolyte balance: Hypokalemia ?Plan ?Replace potassium ?Follow-up chemistries ? ?History of diabetes with hyperglycemia ?Plan ?Sliding scale insulin ?Hold metformin ? ?History of cirrhosis, felt secondary to hep C ?Plan ?Supportive care ?Universal precautions ?A.m. LFTs ? ? ? ?Best Practice (right click and "Reselect all SmartList Selections" daily)  ? ?Diet/type: NPO ?DVT prophylaxis: SCD ?GI prophylaxis: PPI ?Lines: Central line ?Foley:   Yes, and it is still needed ?Code Status:  full code ?Last date of multidisciplinary goals of care discussion [pending] ? ?Labs   ?CBC: ?Recent Labs  ?Lab 08/18/2021 ?0800 08/30/2021 ?0801  ?WBC  --  PENDING  ?NEUTROABS  --  PENDING  ?HGB 12.9 11.3*  ?HCT 38.0 39.7  ?MCV  --  87.3  ?PLT  --  185  ? ? ?Basic Metabolic Panel: ?Recent Labs  ?Lab 08/22/2021 ?0800  ?NA 137  ?K 4.5  ?CL 110  ?GLUCOSE 399*  ?BUN 12  ?CREATININE 1.00  ? ?GFR: ?CrCl cannot be calculated (Unknown ideal weight.). ?Recent Labs  ?Lab 08/24/2021 ?0801  ?WBC PENDING  ? ? ?Liver Function Tests: ?No results for input(s): AST, ALT, ALKPHOS, BILITOT, PROT, ALBUMIN in the last 168 hours. ?No results for input(s): LIPASE, AMYLASE in the last 168 hours. ?Recent Labs  ?Lab 08/18/2021 ?0803  ?AMMONIA 101*  ? ? ?ABG ?   ?Component Value Date/Time  ? TCO2 11 (L) 08/16/2021 0800  ?  ? ?Coagulation Profile: ?Recent Labs  ?Lab 08/13/2021 ?0801  ?INR 1.2  ? ? ?Cardiac Enzymes: ?No results for input(s): CKTOTAL, CKMB, CKMBINDEX, TROPONINI in the last 168 hours. ? ?HbA1C: ?Hgb A1c MFr Bld  ?Date/Time Value Ref Range Status  ?06/18/2021 09:48 AM 6.2 4.6 - 6.5 % Final  ?  Comment:  ?  Glycemic Control Guidelines for People with Diabetes:Non Diabetic:  <6%Goal of Therapy: <7%Additional Action Suggested:  >8%   ?08/15/2020 08:12 AM 6.3 4.6 - 6.5 % Final  ?  Comment:  ?  Glycemic Control Guidelines for People with  Diabetes:Non Diabetic:  <6%Goal of Therapy: <7%Additional Action Suggested:  >8%   ? ? ?CBG: ?Recent Labs  ?Lab 09/03/2021 ?0745  ?GLUCAP 243*  ? ? ?Review of Systems:   ?See above ? 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

## 2021-08-31 NOTE — ED Provider Notes (Signed)
?[facility] ?Provider Note ? ? ?CSN: [id number] ?Arrival date & time: 08/14/2021  6759 ? ?  ? ?History ? ?Chief Complaint  ?Patient presents with  ? Shortness of Breath  ? ? ?Deborah Wise is a 71 y.o. female. ? ? ?Shortness of Breath a thinks of thanks ?Patient presented reportedly with shortness of breath.  Reportedly had acute shortness of breath after waking up and eating breakfast.  No known choking.  Reportedly sats of 30% and decreased mental status.  Reported throat has "closed up in the past.  Upon arrival patient went into cardiac arrest.  Was in a bradycardic rhythm but appeared to be a third-degree block after monitors were placed.  Received epinephrine and atropine.  Did have return of pulse.  Intubated by Dr. Jeanell Sparrow.  CBG 230.  Patient cannot provide history.  Reported history of cirrhosis. ?  ? ?Home Medications ?Prior to Admission medications   ?Medication Sig Start Date End Date Taking? Authorizing Provider  ?amLODipine (NORVASC) 10 MG tablet TAKE ONE TABLET BY MOUTH EVERY MORNING FOR BLOOD PRESSURE ?Patient taking differently: Take 10 mg by mouth daily. 07/01/21  Yes Erline Hau, MD  ?ASPIRIN LOW DOSE 81 MG EC tablet TAKE 1 TABLET BY MOUTH EVERY DAY ?Patient taking differently: Take 81 mg by mouth daily. 12/18/19  Yes Isaac Bliss, Rayford Halsted, MD  ?Catheters [facility] SYSTEM STRAIGHT TIP) KIT 1 each by Does not apply route as needed. ?Patient taking differently: 1 each by Does not apply route daily. 06/10/20  Yes [name], MD  ?Cholecalciferol 25 MCG (1000 UT) capsule Take 1,000 Units by mouth daily.    Yes [provider]  ?cyanocobalamin 1000 MCG tablet Take 1,000 mcg by mouth in the morning.   Yes [provider]  ?docusate sodium (COLACE) 100 MG capsule Take 1 capsule (100 mg total) by mouth 2 (two) times daily. ?Patient taking differently: Take 100 mg by mouth 2 (two) times daily as needed. 07/01/21  Yes  [name], MD  ?ferrous sulfate 325 (65 FE) MG EC tablet Take 1 tablet (325 mg total) by mouth in the morning and at bedtime. ?Patient taking differently: Take 325 mg by mouth in the morning and at bedtime. 08/15/20  Yes Isaac Bliss, Rayford Halsted, MD  ?hydrochlorothiazide (HYDRODIURIL) 25 MG tablet TAKE ONE TABLET BY MOUTH EVERY MORNING FOR fluid & FOR BLOOD PRESSURE ?Patient taking differently: Take 25 mg by mouth in the morning. 07/01/21  Yes Isaac Bliss, Rayford Halsted, MD  ?lidocaine (XYLOCAINE) 2 % jelly Apply 1 application topically as needed. ?Patient taking differently: Apply 1 application. topically daily. 09/03/20  Yes [name], MD  ?metFORMIN (GLUCOPHAGE) 500 MG tablet TAKE ONE TABLET BY MOUTH EVERY MORNING FOR diabetes ?Patient taking differently: Take 500 mg by mouth daily with breakfast. 10/07/20  Yes Isaac Bliss, Rayford Halsted, MD  ?Multiple Vitamins-Minerals (HAIR SKIN AND NAILS FORMULA PO) Take 1 tablet by mouth daily.   Yes [provider]  ?REPATHA SURECLICK 163 MG/ML SOAJ INJECT 170m into THE SKIN every 14 DAYS FOR cholesterol ?Patient taking differently: Inject 140 mg into the skin every 14 (fourteen) days. 10/08/20  Yes McCue, JJanett Billow NP  ?tamsulosin (FLOMAX) 0.4 MG CAPS capsule TAKE ONE CAPSULE BY MOUTH DAILY FOR FOR BLADDER ?Patient taking differently: 0.4 mg daily. 04/01/21  Yes SJaquita Folds MD  ?Trospium Chloride 60 MG CP24 Take 1 capsule (60 mg total) by mouth every morning. 07/01/21  Yes SWannetta Sender  Governor Rooks, MD  ?estradiol (ESTRACE) 0.1 MG/GM vaginal cream Place 0.5 g vaginally 2 (two) times a week. Place 0.5g nightly for two weeks then twice a week after ?Patient not taking: Reported on 08/15/2021 07/02/21   [name], MD  ?triamcinolone cream (KENALOG) 0.1 % APPLY TO THE AFFECTED AREA(S) TWICE DAILY FOR SKIN condition ?Patient not taking: Reported on 08/08/2021 08/27/21   Isaac Bliss, Rayford Halsted, MD  ?   ? ?Allergies    ?Patient has no  known allergies.   ? ?Review of Systems   ?Review of Systems  ?Respiratory:  Positive for shortness of breath.   ? ?Physical Exam ?Updated Vital Signs ?BP (!) 94/57   Pulse 78   Temp 99.3 ?F (37.4 ?C)   Resp 14   Ht 5' 4"  (1.626 m)   Wt 77.1 kg   SpO2 99%   BMI 29.18 kg/m?  ?Physical Exam ?Vitals reviewed.  ?HENT:  ?   Head: Atraumatic.  ?Cardiovascular:  ?   Rate and Rhythm: Bradycardia present.  ?Chest:  ?   Chest wall: No tenderness.  ?Abdominal:  ?   Tenderness: There is no abdominal tenderness.  ?Musculoskeletal:  ?   Right lower leg: No edema.  ?   Left lower leg: No edema.  ?Skin: ?   General: Skin is warm.  ?   Capillary Refill: Capillary refill takes less than 2 seconds.  ?Neurological:  ?   Comments: Unresponsive.  Initially in cardiac arrest.  ? ? ?ED Results / Procedures / Treatments   ?Labs ?(all labs ordered are listed, but only abnormal results are displayed) ?Labs Reviewed  ?COMPREHENSIVE METABOLIC PANEL - Abnormal; Notable for the following components:  ?    Result Value  ? CO2 8 (*)   ? Glucose, Bld 396 (*)   ? Creatinine, Ser 1.26 (*)   ? Total Protein 6.3 (*)   ? Albumin 3.0 (*)   ? AST 46 (*)   ? GFR, Estimated 46 (*)   ? Anion gap 20 (*)   ? All other components within normal limits  ?LACTIC ACID, PLASMA - Abnormal; Notable for the following components:  ? Lactic Acid, Venous >9.0 (*)   ? All other components within normal limits  ?LACTIC ACID, PLASMA - Abnormal; Notable for the following components:  ? Lactic Acid, Venous 4.9 (*)   ? All other components within normal limits  ?CBC WITH DIFFERENTIAL/PLATELET - Abnormal; Notable for the following components:  ? Hemoglobin 11.3 (*)   ? MCH 24.8 (*)   ? MCHC 28.5 (*)   ? RDW 18.9 (*)   ? nRBC 3.6 (*)   ? Lymphs Abs 4.8 (*)   ? Abs Immature Granulocytes 0.13 (*)   ? All other components within normal limits  ?URINALYSIS, ROUTINE W REFLEX MICROSCOPIC - Abnormal; Notable for the following components:  ? APPearance HAZY (*)   ? Glucose, UA  >=500 (*)   ? Hgb urine dipstick MODERATE (*)   ? Protein, ur 100 (*)   ? Bacteria, UA FEW (*)   ? All other components within normal limits  ?D-DIMER, QUANTITATIVE - Abnormal; Notable for the following components:  ? D-Dimer, Quant 3.26 (*)   ? All other components within normal limits  ?AMMONIA - Abnormal; Notable for the following components:  ? Ammonia 101 (*)   ? All other components within normal limits  ?BASIC METABOLIC PANEL - Abnormal; Notable for the following components:  ? Potassium 3.2 (*)   ?  CO2 21 (*)   ? Glucose, Bld 316 (*)   ? Creatinine, Ser 1.25 (*)   ? Calcium 8.7 (*)   ? GFR, Estimated 47 (*)   ? All other components within normal limits  ?GLUCOSE, CAPILLARY - Abnormal; Notable for the following components:  ? Glucose-Capillary 273 (*)   ? All other components within normal limits  ?GLUCOSE, CAPILLARY - Abnormal; Notable for the following components:  ? Glucose-Capillary 244 (*)   ? All other components within normal limits  ?CBG MONITORING, ED - Abnormal; Notable for the following components:  ? Glucose-Capillary 243 (*)   ? All other components within normal limits  ?I-STAT ARTERIAL BLOOD GAS, ED - Abnormal; Notable for the following components:  ? pH, Arterial 7.160 (*)   ? Bicarbonate 14.3 (*)   ? TCO2 16 (*)   ? Acid-base deficit 14.0 (*)   ? All other components within normal limits  ?I-STAT CHEM 8, ED - Abnormal; Notable for the following components:  ? Glucose, Bld 399 (*)   ? Calcium, Ion 1.12 (*)   ? TCO2 11 (*)   ? All other components within normal limits  ?POCT I-STAT 7, (LYTES, BLD GAS, ICA,H+H) - Abnormal; Notable for the following components:  ? pH, Arterial 7.349 (*)   ? pO2, Arterial 58 (*)   ? TCO2 21 (*)   ? Acid-base deficit 5.0 (*)   ? Potassium 3.0 (*)   ? HCT 35.0 (*)   ? Hemoglobin 11.9 (*)   ? All other components within normal limits  ?POCT I-STAT 7, (LYTES, BLD GAS, ICA,H+H) - Abnormal; Notable for the following components:  ? pCO2 arterial 21.7 (*)   ? pO2,  Arterial 130 (*)   ? Bicarbonate 14.2 (*)   ? TCO2 15 (*)   ? Acid-base deficit 9.0 (*)   ? Potassium 2.3 (*)   ? Calcium, Ion 0.95 (*)   ? HCT 24.0 (*)   ? Hemoglobin 8.2 (*)   ? All other components within normal limits  ?TR

## 2021-08-31 NOTE — Progress Notes (Incomplete)
STUDENT FOLLOW-UP NOTES - DISREGARD ? ? ? ? ? ? ? ? ? ? ? ? ? ? ? ? ? ? ? ? ? ? ? ? ? ?No Known Allergies  ? ?Past Medical History:  ?Diagnosis Date  ? Bilateral carotid artery stenosis   ? vascular-- Dagoberto Ligas PA --- last duplex in epic 10-06-2020 right ICA 60-79% and left ICA 40-59%  ? Bladder outlet obstruction   ? self caths  ? Bladder wall thickening   ? Blood transfusion without reported diagnosis   ? Coronary artery disease   ? cardiologist--- dr Gwenlyn Found---- cath 03-25-2020 showed proxRCA 75%, distal RCA 50%, and midLAD 60%,  managed medically  ? Detrusor overactivity   ? urinary bladder  ? Esophageal varices (HCC)   ? followed by dr Henrene Pastor---  nonbleeding  ? Fibroid, uterine   ? Full dentures   ? Hepatic cirrhosis due to chronic hepatitis C infection ([place])   ? followed by [place] NP---  s/p treated chronic hep c with cure and complicated by portal hypertension and esophageal varies  ? History of CVA (cerebrovascular accident) without residual deficits 06/2016  ? neurologist--- Frann Rider---  hospital admission 06-27-2016 right [place] infart with  left MCA aneurysm,  no residual per pt  ? History of hepatitis C   ? post treatement and cured  ? Hyperlipidemia   ? Hypertension   ? IDA (iron deficiency anemia)   ? 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 left MCA ;  followed by neurologist  ? Peripheral vascular disease ([place])   ? Self-catheterizes urinary bladder   ? Stroke [facility] 2018  ? Thrombocytopenia ([place]) 12/25/2014  ? Type 2 diabetes mellitus ([place])   ? followed by pcp---- (01-14-2021 pt stated check blood sugar at home twice weekly fasting, did not know average)  ? Vaginal atrophy   ?  ? ?Family History  ?Problem Relation Age of Onset  ? Diabetes type II Mother   ? Hypertension Mother   ? Stroke Mother   ? Cancer Father   ?     patient does not know what type of cancer  ? Diabetes Daughter   ? Breast cancer Neg Hx   ? Colon polyps Neg Hx   ? Crohn's disease Neg Hx   ? Esophageal  cancer Neg Hx   ? Rectal cancer Neg Hx   ? Stomach cancer Neg Hx   ? Colon cancer Neg Hx   ?  ?Social History  ? ?Socioeconomic History  ? Marital status: Married  ?  Spouse name: Not on file  ? Number of children: 3  ? Years of education: Not on file  ? Highest education level: Not on file  ?Occupational History  ? Occupation: disabled  ?Tobacco Use  ? Smoking status: Never  ? Smokeless tobacco: Never  ?Vaping Use  ? Vaping Use: Never used  ?Substance and Sexual Activity  ? Alcohol use: Not Currently  ?  Alcohol/week: 2.0 standard drinks  ?  Types: 2 Cans of beer per week  ?  Comment: 01-14-2021 per pt on the weekend, two beers  ? Drug use: Not Currently  ?  Types: Cocaine  ?  Comment: 01-14-2021 last use cocaine last year 2018  ? Sexual activity: Yes  ?  Partners: Male  ?  Comment: married- 11 yrs   ?Other Topics Concern  ? Not on file  ?Social History Narrative  ? Not on file  ? ?Social Determinants of Health  ? ?  Financial Resource Strain: Not on file  ?Food Insecurity: Not on file  ?Transportation Needs: Not on file  ?Physical Activity: Not on file  ?Stress: Not on file  ?Social Connections: Not on file  ?  ? ?Today's Vitals  ? 08/08/2021 0745 08/30/2021 0746 08/15/2021 0800 08/28/2021 0900  ?BP: (!) 118/58  (!) 147/68   ?Pulse:  84    ?Resp: '16 18 17   '$ ?SpO2:  99%    ?Weight:    77.1 kg (170 lb)  ?PainSc:      ? ?Body mass index is 29.18 kg/m?.  ? ? ?Imaging: ? ? ? ?CC: cardiac arrest, PEA ? ?HPI: 23 YOF brought in by EMS with acute onset SOB and subsequent cardiac arrest after arrival to ED. ACLS performed ~10 minutes before ROSC. ?ACS vs PE etiology. ? ? ?A/P ? ?Cardiac Arrest, PEA ?hsTn 95, D-dimer 3.26,  ?No evidence of PE on CT; ECHO showing septal and inferior akinesis, EF 35-40% ? ?-starting heparin gtt ?-HF GDMT? ? ? ? ?Metabolic Acidosis ?pH 1.61, HCO3 14.3, Anion Gap 20? ? ? ? ? ?Anticoag: Hgb 11.9, Plts 185 ? ?ID: WBC 8.0, Tmax 98.6, Procal 1.04 ? ?Antimicrobials this admission:  ?Unasyn 3/27 >>  *** ? ?Microbiology results: ?3/27 [id number] ?3/27 TA: rare G+ cocci pairs, rods ?3/27 Resp. Panel: pending ?3/27 MRSA PCR: negative ? ?CV: hsTn 797,  ? ?Endocrine:  ? ?GI/Nutrition:  ? ?Neuro:  ? ?Renal: Scr 1.25, K 3.0,  ?-check Mg ?-Replete K 40 x 2 ? ?Hepatic: AST/ALT 46/18 ? ?Pulm: Lactate 4.9 ? ?Heme/Onc:  ? ?PTA Med Issues:  ? ?Additional Notes:   ?

## 2021-08-31 NOTE — Progress Notes (Signed)
Husband and daughter updated in the ED conference room. Confirmed full code. Similar episode of sudden onset SOB a few weeks ago, treated with antibiotics at urgent care. This morning had no had anything to eat or drink prior to this episode-- sudden onset SOB, clutching her chest but did not endorse pain, sweating, several episodes of vomiting. This began after she had woken up normally. ? ?CT PE pending. Repeat EKG pending. ? ?Julian Hy, DO 08/26/2021 9:29 AM ?[facility] ? ?

## 2021-08-31 NOTE — Chronic Care Management (AMB) (Signed)
? ? ?Chronic Care Management ?Pharmacy Assistant  ? ?Name: Deborah Wise  MRN: [id number] DOB: 1950/07/16 ? ?Reason for Encounter: Medication Review Medication Coordination  ?  ?Recent office visits:  ?None ? ?Recent consult visits:  ?None ? ?Hospital visits:  ?Medication Reconciliation was completed by comparing discharge summary, patient?s EMR and Pharmacy list, and upon discussion with patient. ? ?Patient presented to [facility] on 09/02/2021 due to Cardiac Arrest. Patient still in hospital on 09/01/21 ? ?Medications: ?Facility-Administered Encounter Medications as of 08/09/2021  ?Medication  ? amiodarone (NEXTERONE PREMIX) 360-4.14 MG/200ML-% (1.8 mg/mL) IV infusion  ? Ampicillin-Sulbactam (UNASYN) 3 g in sodium chloride 0.9 % 100 mL IVPB  ? chlorhexidine gluconate (MEDLINE KIT) (PERIDEX) 0.12 % solution 15 mL  ? docusate (COLACE) 50 MG/5ML liquid 100 mg  ? docusate (COLACE) 50 MG/5ML liquid 100 mg  ? EPINEPHrine (ADRENALIN) 5 mg in NS 250 mL (0.02 mg/mL) premix infusion  ? fentaNYL (SUBLIMAZE) injection 25 mcg  ? fentaNYL (SUBLIMAZE) injection 25-100 mcg  ? fentaNYL (SUBLIMAZE) injection 25-100 mcg  ? heparin ADULT infusion 100 units/mL (25000 units/221m)  ? heparin bolus via infusion 4,000 Units  ? insulin aspart (novoLOG) injection 2-6 Units  ? ipratropium-albuterol (DUONEB) 0.5-2.5 (3) MG/3ML nebulizer solution 3 mL  ? labetalol (NORMODYNE) injection 10 mg  ? MEDLINE mouth rinse  ? pantoprazole sodium (PROTONIX) 40 mg/20 mL oral suspension 40 mg  ? polyethylene glycol (MIRALAX / GLYCOLAX) packet 17 g  ? polyethylene glycol (MIRALAX / GLYCOLAX) packet 17 g  ? propofol (DIPRIVAN) 1000 MG/100ML infusion  ? sodium bicarbonate 150 mEq in sterile water 1,150 mL infusion  ? sodium bicarbonate injection 100 mEq  ? ?Outpatient Encounter Medications as of 08/29/2021  ?Medication Sig  ? ACCU-CHEK GUIDE test strip USE TO check blood glucose twice daily  ? Accu-Chek Softclix Lancets lancets USE AS  DIRECTED ONCE DAILY  ? Accu-Chek Softclix Lancets lancets USE TO check blood glucose twice daily  ? amLODipine (NORVASC) 10 MG tablet TAKE ONE TABLET BY MOUTH EVERY MORNING FOR BLOOD PRESSURE (Patient taking differently: Take 10 mg by mouth daily.)  ? ASPIRIN LOW DOSE 81 MG EC tablet TAKE 1 TABLET BY MOUTH EVERY DAY (Patient taking differently: Take 81 mg by mouth daily.)  ? Blood Glucose Monitoring Suppl (ACCU-CHEK GUIDE) w/Device KIT Dx E11.9  ? carboxymethylcellulose (REFRESH PLUS) 0.5 % SOLN Place 1 drop into both eyes daily.  ? Catheters (SELF-CATH SYSTEM STRAIGHT TIP) KIT 1 each by Does not apply route as needed.  ? Cholecalciferol 25 MCG (1000 UT) capsule Take 1,000 Units by mouth daily.   ? Continuous Blood Gluc Receiver (FREESTYLE LIBRE 2 READER) DEVI 1 each by Does not apply route daily.  ? Continuous Blood Gluc Sensor (FREESTYLE LIBRE 2 SENSOR) MISC 1 each by Does not apply route daily.  ? cyanocobalamin 1000 MCG tablet Take 1,000 mcg by mouth daily.   ? docusate sodium (COLACE) 100 MG capsule Take 1 capsule (100 mg total) by mouth 2 (two) times daily.  ? estradiol (ESTRACE) 0.1 MG/GM vaginal cream Place 0.5 g vaginally 2 (two) times a week. Place 0.5g nightly for two weeks then twice a week after  ? ferrous sulfate 325 (65 FE) MG EC tablet Take 1 tablet (325 mg total) by mouth in the morning and at bedtime. (Patient taking differently: Take 325 mg by mouth in the morning and at bedtime.)  ? hydrochlorothiazide (HYDRODIURIL) 25 MG tablet TAKE ONE TABLET BY MOUTH EVERY MORNING FOR fluid &  FOR BLOOD PRESSURE  ? lidocaine (XYLOCAINE) 2 % jelly Apply 1 application topically as needed. (Patient taking differently: Apply 1 application topically as needed (pain).)  ? metFORMIN (GLUCOPHAGE) 500 MG tablet TAKE ONE TABLET BY MOUTH EVERY MORNING FOR diabetes (Patient taking differently: Take 500 mg by mouth daily with breakfast.)  ? REPATHA SURECLICK 448 MG/ML SOAJ INJECT 170m into THE SKIN every 14 DAYS FOR  cholesterol (Patient taking differently: Inject 140 mg into the skin every 14 (fourteen) days.)  ? tamsulosin (FLOMAX) 0.4 MG CAPS capsule TAKE ONE CAPSULE BY MOUTH DAILY FOR FOR BLADDER (Patient taking differently: 0.4 mg daily.)  ? triamcinolone cream (KENALOG) 0.1 % APPLY TO THE AFFECTED AREA(S) TWICE DAILY FOR SKIN condition (Patient taking differently: Apply 1 application. topically 2 (two) times daily.)  ? Trospium Chloride 60 MG CP24 Take 1 capsule (60 mg total) by mouth every morning.  ?Reviewed chart for medication changes ahead of medication coordination call. ? ?No OVs, Consults, or hospital visits since last care coordination call/Pharmacist visit.  ? ?No medication changes indicated  ? ? ?BP Readings from Last 3 Encounters:  ?08/15/2021 (!) 147/68  ?07/01/21 (!) 192/78  ?06/18/21 128/70  ?  ?Lab Results  ?Component Value Date  ? HGBA1C 6.2 06/18/2021  ?  ? ?Patient obtains medications through Adherence Packaging  30 Days  ? ?Last adherence delivery included:  ?Repatha Sureclick 1185MG/ML: inject every 14 days ?Triamcinolone cream (KENALOG) 0.1 %: apply to affected areas twice daily ?Amlodipine 10 mg: one tablet at breakfast ?Aspirin 81 mg: one tablet at breakfast ?Vitamin D3 25 MCG 1000-unit: one capsule at breakfast ?Vitamin B-12 Daily 1000 MCG: one tablet at breakfast ?Hydrochlorothiazide 25 mg: one tablet at breakfast ?Metformin 500 mg: one tablet at breakfast ?Tamsulosin HCl 0.4 mg: one capsule at breakfast ?Ferrous sulfate 325 (65 FE) MG EC tablet: one tablet at breakfast and one tablet at bedtime ?Trospium Chloride 60 mg: one tablet at breakfast  ?Accu chek test strips - use to check blood sugar twice daily.  ?chlorhexidine (PERIDEX) 0.12 % solution 15 mLs in the mouth or throat 2 (two) times daily ?estradiol (ESTRACE) 0.1 MG/GM vaginal cream 0.5 mg twice weekly at bedtime ? ?Patient is due for next adherence delivery on: 09/09/21 ?Called patient and reviewed medications and coordinated  delivery. ?Packs for 30 DS ? ? ?This delivery to include: ?Softclix Lancets ?Triamcinolone cream (KENALOG) 0.1 %: apply to affected areas twice daily ?Tamsulosin HCl 0.4 mg: one capsule at breakfast ?Metformin 500 mg: one tablet at breakfast ?Hydrochlorothiazide 25 mg: one tablet at breakfast ?Amlodipine 10 mg: one tablet at breakfast ?Ferrous sulfate 325 (65 FE) MG EC tablet: one tablet at breakfast and one tablet at bedtime ?Vitamin D3 25 MCG 1000-unit: one capsule at breakfast ?Vitamin B-12 Daily 1000 MCG: one tablet at breakfast ?Aspirin 81 mg: one tablet at breakfast ?Trospium Chloride 60 mg: one tablet at breakfast  ?Docusate Sodium 100 mg: one capsule twice daily ?Trospium Chloride 60 mg: one tablet at breakfast  ?Repatha Sureclick 1631MG/ML: inject every 14 days ?Accu chek test strips - use to check blood sugar twice daily.  ? ? ? ?Unable to reach to Confirm delivery date of 09/09/21, patient in hospital advised  pharmacy  ? ?Care Gaps: ?Eye Exam - Overdue ?Zoster Vaccine - Overdue ?Urine Micro - Overdue ?Foot Exam - Overdue ?COVID Booster - Overdue ?Lab Results  ?Component Value Date  ? HGBA1C 6.2 06/18/2021  ? ? ?Star Rating Drugs: ?Metformin 5037m- last filled  on 08/05/2021 30DS at [facility] ? ? ? ?Ned Clines CMA ?Clinical Pharmacist Assistant ?[phone] ? ?

## 2021-08-31 NOTE — Progress Notes (Signed)
eLink Physician-Brief Progress Note ?Patient Name: Deborah Wise ?DOB: 08/31/1950 ?MRN: [id number] ? ? ?Date of Service ? 08/15/2021  ?HPI/Events of Note ? Patient needs PRN Tylenol order, BP 113/52, MAP 67. Ionized calcium 0.95.  ?eICU Interventions ? PRN Tylenol ordered, Calcium replaced.  ? ? ? ?  ? ?Kerry Kass Valli Randol ?08/16/2021, 8:26 PM ?

## 2021-08-31 NOTE — ED Provider Notes (Signed)
70 year old female with cardiac arrest on arrival to ED. ?Patient seen and comanaged with Dr. Alvino Chapel seen. ?Please see his note for complete H&P ?Patient intubated by myself and right femoral triple-lumen catheter placed ?Date/Time: 08/30/2021 8:58 AM ?Performed by: Pattricia Boss, MD ?Pre-anesthesia Checklist: Patient identified, Emergency Drugs available, Suction available, Patient being monitored and Timeout performed ?Oxygen Delivery Method: Non-rebreather mask ?Preoxygenation: Pre-oxygenation with 100% oxygen ?Induction Type: Rapid sequence ?Ventilation: Mask ventilation without difficulty ?Laryngoscope Size: Glidescope and 4 ?Tube size: 8.0 mm ?Number of attempts: 1 ?Airway Equipment and Method: Patient positioned with wedge pillow and Video-laryngoscopy ?Placement Confirmation: ETT inserted through vocal cords under direct vision, Positive ETCO2, CO2 detector and Breath sounds checked- equal and bilateral ?Secured at: 25 cm ?Tube secured with: ETT holder ?Dental Injury: Teeth and Oropharynx as per pre-operative assessment  ?Comments: Patient with good bilateral breath sounds ?Chest x-Jacon Whetzel reviewed and ET tube at top of right carina ?Tube pulled back 2 cm and continues to have good bilateral breath sounds ? ? ? ?.Central Line ? ?Date/Time: 08/17/2021 8:58 AM ?Performed by: Pattricia Boss, MD ?Authorized by: Pattricia Boss, MD  ? ?Consent:  ?  Consent obtained:  Emergent situation ?Universal protocol:  ?  Immediately prior to procedure, a time out was called: yes   ?Pre-procedure details:  ?  Indication(s): central venous access   ?  Hand hygiene: Hand hygiene performed prior to insertion   ?  Sterile barrier technique: All elements of maximal sterile technique followed   ?  Skin preparation:  Chlorhexidine ?  Skin preparation agent: Skin preparation agent completely dried prior to procedure   ?Sedation:  ?  Sedation type:  None ?Anesthesia:  ?  Anesthesia method:  None ?Procedure details:  ?  Location:  R femoral ?   Site selection rationale:  Emergent need for access ?  Patient position:  Supine ?  Procedural supplies:  Triple lumen ?  Catheter size:  7 Fr ?  Landmarks identified: yes   ?  Ultrasound guidance: yes   ?  Sterile ultrasound techniques: Sterile gel and sterile probe covers were used   ?  Number of attempts:  3 ?  Successful placement: yes   ?Post-procedure details:  ?  Post-procedure:  Line sutured and dressing applied ?  Assessment:  Blood return through all ports ?  Procedure completion:  Tolerated ? ?  ?Pattricia Boss, MD ?08/17/2021 [phone] ? ?

## 2021-08-31 NOTE — [place] Notes (Signed)
Patient transported to [facility] from ED without complications. RN at bedside. ?

## 2021-08-31 NOTE — Consult Note (Signed)
?Cardiology Consultation:  ? ?Patient ID: Deborah Wise ?MRN: [id number]; DOB: August 19, 1950 ? ?Admit date: 09/01/2021 ?Date of Consult: 08/26/2021 ? ?PCP:  Isaac Bliss, Rayford Halsted, MD ?  ?[facility] Providers ?Cardiologist:  Candee Furbish, MD      ? ? ?Patient Profile:  ? ?Deborah Wise is a 71 y.o. female with a hx of CAD treated medically after cath 03/2020, hx CVA 2018, DM-2, HLD, Carotid artery disease, cirrhosis, hepatitis C. Prior thrombocytopenia due to splenomegaly, cirrhosis/thromboyctopenia who is being seen 08/21/2021 for the evaluation of heart block and PAF post code at the request of Dr. Alvino Chapel. ? ?History of Present Illness:  ? ?Deborah Wise with above hx and with + cardiac CT had cardiac Cath 2021 with RCA 75% prox.  Distral RCA 50%, mLAD 60% stenosis, EF 55-65%  in 2021.  Pt has not been seen by cards since that time.   ? ?With her carotid disease Right carotid 79% left 59%. Also with PAD with R ABI 0.52 and Left 0.72.   she is followed by Dr. Trula Slade.   Her cirrhosis followed by [facility] and secondary to Hep C. With probable component of alcoholic liver disease.  Her cirrhosis is complicated by portal hypertension and esophageal varices most recent EGD in August 2022 showed no varices. ? ?Underwent coronary CTA 03/11/2020 which showed calcium score 2566 (99th percentile), severe CAD in LAD and RCA.  Cath in 03/2020 showed 75% proximal RCA, 50% distal RCA, 60% mid LAD.  Plan was for medical therapy and if refractory symptoms, consider atherectomy of RCA.  Echocardiogram 03/12/2020 showed normal biventricular function, grade 1 diastolic dysfunction, no significant valvular disease. ? ?Pt now presents with acute SOB after breakfast today.  Sp02 at 30% on EMS arrival and was only alert to painful stimuli.  She was started on CPAP in route.  Rhythm was reportedly sinus.  She had cardiac arrest shortly after arrival to ED, prior to being placed on cardiac monitor.  PEA arrest reportedly, no  shockable rhythms during code.  She received ACLS for about 10 minutes and ROSC was obtained.  Initial rhythm post ROSC appeared complete heart block and 30s, then went into junctional rhythm in 60s, then went into A-fib with RVR to 150s.  She is currently in A-fib.  Briefly required epinephrine postarrest but currently normotensive off pressors.  Currently unresponsive.  Most recent BP 147/68.  Labs notable for creatinine 1.26, albumin 3.0, AST 46, ALT 18, glucose 396, potassium 4.6, sodium 137, troponin 95, hemoglobin 11.3, platelets 185, D-dimer 3.26.  ABG with 7.1 6/40/90.  Chest x-ray with basilar and mid opacities suspicious for localized focal infection versus asymmetric edema, small bilateral effusions.  Junctional rhythm, 1 mm concave ST elevation in inferior leads, T wave inversions in V1-3, right bundle branch block, left anterior fascicular block ? ? ?Past Medical History:  ?Diagnosis Date  ? Bilateral carotid artery stenosis   ? vascular-- Dagoberto Ligas PA --- last duplex in epic 10-06-2020 right ICA 60-79% and left ICA 40-59%  ? Bladder outlet obstruction   ? self caths  ? Bladder wall thickening   ? Blood transfusion without reported diagnosis   ? Coronary artery disease   ? cardiologist--- dr Gwenlyn Found---- cath 03-25-2020 showed proxRCA 75%, distal RCA 50%, and midLAD 60%,  managed medically  ? Detrusor overactivity   ? urinary bladder  ? Esophageal varices (HCC)   ? followed by dr Henrene Pastor---  nonbleeding  ? Fibroid, uterine   ? Full dentures   ?  Hepatic cirrhosis due to chronic hepatitis C infection ([place])   ? followed by [place] NP---  s/p treated chronic hep c with cure and complicated by portal hypertension and esophageal varies  ? History of CVA (cerebrovascular accident) without residual deficits 06/2016  ? neurologist--- Frann Rider---  hospital admission 06-27-2016 right [place] infart with  left MCA aneurysm,  no residual per pt  ? History of hepatitis C   ? post treatement  and cured  ? Hyperlipidemia   ? Hypertension   ? IDA (iron deficiency anemia)   ? 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 left MCA ;  followed by neurologist  ? Peripheral vascular disease ([place])   ? Self-catheterizes urinary bladder   ? Stroke [facility]) 2018  ? Thrombocytopenia ([place]) 12/25/2014  ? Type 2 diabetes mellitus ([place])   ? followed by pcp---- (01-14-2021 pt stated check blood sugar at home twice weekly fasting, did not know average)  ? Vaginal atrophy   ? ? ?Past Surgical History:  ?Procedure Laterality Date  ? [place]  ? WITH BILATERAL TUBAL LIGATION  ? COLONOSCOPY  2017  ? PER PT SCHEDULED FOR NEXT ONE 02-02-2021  ? CYSTOSCOPY N/A 01/19/2021  ? Procedure: CYSTOSCOPY;  Surgeon: [name], MD;  Location: [facility];  Service: Gynecology;  Laterality: N/A;  ? ESOPHAGOGASTRODUODENOSCOPY  11/27/2018  ? LAST ONE BY DR Henrene Pastor  ? IR GENERIC HISTORICAL  06/30/2016  ? IR ANGIO VERTEBRAL SEL SUBCLAVIAN INNOMINATE UNI R MOD SED 06/30/2016 [name], MD [facility]  ? IR GENERIC HISTORICAL  06/30/2016  ? IR ANGIO VERTEBRAL SEL VERTEBRAL UNI L MOD SED 06/30/2016 [name], MD [facility]  ? IR GENERIC HISTORICAL  06/30/2016  ? IR ANGIO INTRA EXTRACRAN SEL COM CAROTID INNOMINATE BILAT MOD SED 06/30/2016 [name], MD [facility]  ? LEFT HEART CATH AND CORONARY ANGIOGRAPHY N/A 03/25/2020  ? Procedure: LEFT HEART CATH AND CORONARY ANGIOGRAPHY;  Surgeon: Jettie Booze, MD;  Location: [facility];  Service: Cardiovascular;  Laterality: N/A;  ? MULTIPLE EXTRACTIONS WITH ALVEOLOPLASTY  10/25/2011  ? Procedure: MULTIPLE EXTRACION WITH ALVEOLOPLASTY;  Surgeon: Gae Bon, DDS;  Location: [facility];  Service: Oral Surgery;  Laterality: Bilateral;  Extract teeth numbers one, two, six, seven, eight, nine, ten, eleven, twelve, fifteen, sixteen, eighteen, nineteen, twenty-three, twenty-four, twenty-five, twenty-seven, thirty-two and alveoplasty.  ? ORIF  ANKLE FRACTURE Right 11/25/2007  ? TOOTH EXTRACTION N/A 10/20/2020  ? Procedure: MULTIPLE DENTAL RESTORATION/EXTRACTIONS, AVELOPLASTY;  Surgeon: Diona Browner, DMD;  Location: [facility];  Service: Oral Surgery;  Laterality: N/A;  ? UPPER GASTROINTESTINAL ENDOSCOPY    ?  ? ?Home Medications:  ?Prior to Admission medications   ?Medication Sig Start Date End Date Taking? Authorizing Provider  ?ACCU-CHEK GUIDE test strip USE TO check blood glucose twice daily 06/18/21   Isaac Bliss, Rayford Halsted, MD  ?Accu-Chek Softclix Lancets lancets USE AS DIRECTED ONCE DAILY 07/28/21   Isaac Bliss, Rayford Halsted, MD  ?Accu-Chek Softclix Lancets lancets USE TO check blood glucose twice daily 08/27/21   Isaac Bliss, Rayford Halsted, MD  ?amLODipine (NORVASC) 10 MG tablet TAKE ONE TABLET BY MOUTH EVERY MORNING FOR BLOOD PRESSURE 07/01/21   Isaac Bliss, Rayford Halsted, MD  ?ASPIRIN LOW DOSE 81 MG EC tablet TAKE 1 TABLET BY MOUTH EVERY DAY ?Patient taking differently: Take 81 mg by mouth daily. 12/18/19   Isaac Bliss, Rayford Halsted, MD  ?Blood Glucose Monitoring Suppl (ACCU-CHEK GUIDE) w/Device  KIT Dx E11.9 07/09/21   Isaac Bliss, Rayford Halsted, MD  ?carboxymethylcellulose (REFRESH PLUS) 0.5 % SOLN Place 1 drop into both eyes daily.    [provider]  ?Catheters (SELF-CATH SYSTEM STRAIGHT TIP) KIT 1 each by Does not apply route as needed. 06/10/20   [name], MD  ?chlorhexidine (PERIDEX) 0.12 % solution Use as directed 15 mLs in the mouth or throat 2 (two) times daily. 11/12/20   [provider]  ?Cholecalciferol 25 MCG (1000 UT) capsule Take 1,000 Units by mouth daily.     [provider]  ?Continuous Blood Gluc Receiver (FREESTYLE LIBRE 2 READER) DEVI 1 each by Does not apply route daily. 07/17/20   Isaac Bliss, Rayford Halsted, MD  ?Continuous Blood Gluc Sensor (FREESTYLE LIBRE 2 SENSOR) MISC 1 each by Does not apply route daily. 07/17/20   Isaac Bliss, Rayford Halsted, MD  ?cyanocobalamin 1000 MCG tablet Take 1,000  mcg by mouth daily.     [provider]  ?docusate sodium (COLACE) 100 MG capsule Take 1 capsule (100 mg total) by mouth 2 (two) times daily. 07/01/21   [name], MD  ?estradiol

## 2021-08-31 NOTE — [place] Notes (Signed)
Date and time results received: 08/26/2021 '@1230'$  ? ?Test: Lactic acid and Troponin ?Critical Value: Lactic acid 4.9 and Troponin 797 ? ?Name of Provider Notified: Dr Carlis Abbott ? ?Orders Received? Or Actions Taken?: None ?

## 2021-09-01 ENCOUNTER — Encounter (HOSPITAL_COMMUNITY): Payer: Self-pay | Admitting: Critical Care Medicine

## 2021-09-01 ENCOUNTER — Inpatient Hospital Stay (HOSPITAL_COMMUNITY): Payer: Medicare Other

## 2021-09-01 DIAGNOSIS — G931 Anoxic brain damage, not elsewhere classified: Secondary | ICD-10-CM | POA: Diagnosis not present

## 2021-09-01 DIAGNOSIS — I469 Cardiac arrest, cause unspecified: Secondary | ICD-10-CM | POA: Diagnosis not present

## 2021-09-01 DIAGNOSIS — I5041 Acute combined systolic (congestive) and diastolic (congestive) heart failure: Secondary | ICD-10-CM

## 2021-09-01 DIAGNOSIS — I219 Acute myocardial infarction, unspecified: Secondary | ICD-10-CM

## 2021-09-01 DIAGNOSIS — Z9911 Dependence on respirator [ventilator] status: Secondary | ICD-10-CM | POA: Diagnosis not present

## 2021-09-01 LAB — BASIC METABOLIC PANEL
Anion gap: 15 (ref 5–15)
Anion gap: 14 (ref 5–15)
Anion gap: 13 (ref 5–15)
Anion gap: 12 (ref 5–15)
BUN: 21 mg/dL (ref 8–23)
BUN: 21 mg/dL (ref 8–23)
BUN: 25 mg/dL — ABNORMAL HIGH (ref 8–23)
BUN: 27 mg/dL — ABNORMAL HIGH (ref 8–23)
CO2: 20 mmol/L — ABNORMAL LOW (ref 22–32)
CO2: 23 mmol/L (ref 22–32)
CO2: 25 mmol/L (ref 22–32)
CO2: 24 mmol/L (ref 22–32)
Calcium: 8.5 mg/dL — ABNORMAL LOW (ref 8.9–10.3)
Calcium: 8.3 mg/dL — ABNORMAL LOW (ref 8.9–10.3)
Calcium: 8.3 mg/dL — ABNORMAL LOW (ref 8.9–10.3)
Calcium: 8.2 mg/dL — ABNORMAL LOW (ref 8.9–10.3)
Chloride: 101 mmol/L (ref 98–111)
Chloride: 99 mmol/L (ref 98–111)
Chloride: 97 mmol/L — ABNORMAL LOW (ref 98–111)
Chloride: 99 mmol/L (ref 98–111)
Creatinine, Ser: 1.53 mg/dL — ABNORMAL HIGH (ref 0.44–1.00)
Creatinine, Ser: 1.51 mg/dL — ABNORMAL HIGH (ref 0.44–1.00)
Creatinine, Ser: 1.44 mg/dL — ABNORMAL HIGH (ref 0.44–1.00)
Creatinine, Ser: 1.47 mg/dL — ABNORMAL HIGH (ref 0.44–1.00)
GFR, Estimated: 37 mL/min — ABNORMAL LOW (ref >60)
GFR, Estimated: 37 mL/min — ABNORMAL LOW (ref >60)
GFR, Estimated: 39 mL/min — ABNORMAL LOW (ref >60)
GFR, Estimated: 38 mL/min — ABNORMAL LOW (ref >60)
Glucose, Bld: 230 mg/dL — ABNORMAL HIGH (ref 70–99)
Glucose, Bld: 226 mg/dL — ABNORMAL HIGH (ref 70–99)
Glucose, Bld: 189 mg/dL — ABNORMAL HIGH (ref 70–99)
Glucose, Bld: 178 mg/dL — ABNORMAL HIGH (ref 70–99)
Potassium: 3.9 mmol/L (ref 3.5–5.1)
Potassium: 3.5 mmol/L (ref 3.5–5.1)
Potassium: 3.6 mmol/L (ref 3.5–5.1)
Potassium: 4.6 mmol/L (ref 3.5–5.1)
Sodium: 136 mmol/L (ref 135–145)
Sodium: 136 mmol/L (ref 135–145)
Sodium: 135 mmol/L (ref 135–145)
Sodium: 135 mmol/L (ref 135–145)

## 2021-09-01 LAB — PHOSPHORUS
Phosphorus: 4.7 mg/dL — ABNORMAL HIGH (ref 2.5–4.6)
Phosphorus: 4.5 mg/dL (ref 2.5–4.6)
Phosphorus: 4.4 mg/dL (ref 2.5–4.6)

## 2021-09-01 LAB — CBC
HCT: 29.2 % — ABNORMAL LOW (ref 36.0–46.0)
Hemoglobin: 9.6 g/dL — ABNORMAL LOW (ref 12.0–15.0)
MCH: 25.1 pg — ABNORMAL LOW (ref 26.0–34.0)
MCHC: 32.9 g/dL (ref 30.0–36.0)
MCV: 76.2 fL — ABNORMAL LOW (ref 80.0–100.0)
Platelets: 97 10*3/uL — ABNORMAL LOW (ref 150–400)
RBC: 3.83 MIL/uL — ABNORMAL LOW (ref 3.87–5.11)
RDW: 18.2 % — ABNORMAL HIGH (ref 11.5–15.5)
WBC: 14.9 10*3/uL — ABNORMAL HIGH (ref 4.0–10.5)
nRBC: 0.1 % (ref 0.0–0.2)

## 2021-09-01 LAB — TRIGLYCERIDES: Triglycerides: 46 mg/dL (ref <150)

## 2021-09-01 LAB — BLOOD GAS, ARTERIAL
Acid-Base Excess: 2.8 mmol/L — ABNORMAL HIGH (ref 0.0–2.0)
Bicarbonate: 26.1 mmol/L (ref 20.0–28.0)
O2 Saturation: 93.7 %
Patient temperature: 37
pCO2 arterial: 35 mmHg (ref 32–48)
pH, Arterial: 7.48 — ABNORMAL HIGH (ref 7.35–7.45)
pO2, Arterial: 64 mmHg — ABNORMAL LOW (ref 83–108)

## 2021-09-01 LAB — GLUCOSE, CAPILLARY
Glucose-Capillary: 219 mg/dL — ABNORMAL HIGH (ref 70–99)
Glucose-Capillary: 238 mg/dL — ABNORMAL HIGH (ref 70–99)
Glucose-Capillary: 162 mg/dL — ABNORMAL HIGH (ref 70–99)
Glucose-Capillary: 161 mg/dL — ABNORMAL HIGH (ref 70–99)
Glucose-Capillary: 189 mg/dL — ABNORMAL HIGH (ref 70–99)
Glucose-Capillary: 128 mg/dL — ABNORMAL HIGH (ref 70–99)

## 2021-09-01 LAB — MAGNESIUM
Magnesium: 1.8 mg/dL (ref 1.7–2.4)
Magnesium: 1.9 mg/dL (ref 1.7–2.4)
Magnesium: 2.5 mg/dL — ABNORMAL HIGH (ref 1.7–2.4)

## 2021-09-01 LAB — BRAIN NATRIURETIC PEPTIDE: B Natriuretic Peptide: 467.7 pg/mL — ABNORMAL HIGH (ref 0.0–100.0)

## 2021-09-01 LAB — HEPARIN LEVEL (UNFRACTIONATED): Heparin Unfractionated: 0.54 IU/mL (ref 0.30–0.70)

## 2021-09-01 LAB — LACTIC ACID, PLASMA
Lactic Acid, Venous: 3.1 mmol/L (ref 0.5–1.9)
Lactic Acid, Venous: 2.9 mmol/L (ref 0.5–1.9)

## 2021-09-01 LAB — HEMOGLOBIN A1C
Hgb A1c MFr Bld: 6.2 % — ABNORMAL HIGH (ref 4.8–5.6)
Mean Plasma Glucose: 131.24 mg/dL

## 2021-09-01 LAB — PROCALCITONIN: Procalcitonin: 9.24 ng/mL

## 2021-09-01 MED ORDER — ASPIRIN 81 MG PO CHEW
81.0000 mg | CHEWABLE_TABLET | Freq: Every day | ORAL | Status: DC
  Administered 2021-09-01 – 2021-09-04 (×4): 81 mg
  Filled 2021-09-01 (×4): qty 1

## 2021-09-01 MED ORDER — PROSOURCE TF PO LIQD
45.0000 mL | Freq: Every day | ORAL | Status: DC
  Administered 2021-09-02 – 2021-09-04 (×3): 45 mL
  Filled 2021-09-01 (×3): qty 45

## 2021-09-01 MED ORDER — LACTATED RINGERS IV BOLUS
500.0000 mL | Freq: Once | INTRAVENOUS | Status: AC
  Administered 2021-09-01: 500 mL via INTRAVENOUS

## 2021-09-01 MED ORDER — VITAL HIGH PROTEIN PO LIQD: 1000.0000 mL | ORAL | Status: DC

## 2021-09-01 MED ORDER — PROPOFOL 1000 MG/100ML IV EMUL
5.0000 ug/kg/min | INTRAVENOUS | Status: DC
  Administered 2021-09-01 (×2): 50 ug/kg/min via INTRAVENOUS
  Administered 2021-09-01: 40 ug/kg/min via INTRAVENOUS
  Administered 2021-09-01: 50 ug/kg/min via INTRAVENOUS
  Administered 2021-09-02 (×3): 80 ug/kg/min via INTRAVENOUS
  Administered 2021-09-02: 70 ug/kg/min via INTRAVENOUS
  Administered 2021-09-02: 40 ug/kg/min via INTRAVENOUS
  Administered 2021-09-02: 80 ug/kg/min via INTRAVENOUS
  Administered 2021-09-02: 50 ug/kg/min via INTRAVENOUS
  Administered 2021-09-03 – 2021-09-04 (×8): 65 ug/kg/min via INTRAVENOUS
  Administered 2021-09-04: 50 ug/kg/min via INTRAVENOUS
  Administered 2021-09-04: 60 ug/kg/min via INTRAVENOUS
  Filled 2021-09-01 (×22): qty 100

## 2021-09-01 MED ORDER — ATORVASTATIN CALCIUM 80 MG PO TABS
80.0000 mg | ORAL_TABLET | Freq: Every day | ORAL | Status: DC
  Administered 2021-09-01 – 2021-09-04 (×4): 80 mg
  Filled 2021-09-01 (×4): qty 1

## 2021-09-01 MED ORDER — DEXMEDETOMIDINE HCL IN NACL 400 MCG/100ML IV SOLN
0.0000 ug/kg/h | INTRAVENOUS | Status: DC
  Filled 2021-09-01: qty 100

## 2021-09-01 MED ORDER — POTASSIUM CHLORIDE 20 MEQ PO PACK
40.0000 meq | PACK | Freq: Once | ORAL | Status: AC
  Administered 2021-09-01: 40 meq
  Filled 2021-09-01: qty 2

## 2021-09-01 MED ORDER — FUROSEMIDE 10 MG/ML IJ SOLN
60.0000 mg | Freq: Once | INTRAMUSCULAR | Status: AC
  Administered 2021-09-01: 60 mg via INTRAVENOUS
  Filled 2021-09-01: qty 6

## 2021-09-01 MED ORDER — FENTANYL CITRATE PF 50 MCG/ML IJ SOSY: 25.0000 ug | PREFILLED_SYRINGE | INTRAMUSCULAR | Status: DC | PRN

## 2021-09-01 MED ORDER — FUROSEMIDE 10 MG/ML IJ SOLN
40.0000 mg | Freq: Once | INTRAMUSCULAR | Status: AC
Start: 2021-09-01 — End: 2021-09-01
  Administered 2021-09-01: 40 mg via INTRAVENOUS
  Filled 2021-09-01: qty 4

## 2021-09-01 MED ORDER — VITAL AF 1.2 CAL PO LIQD
1000.0000 mL | ORAL | Status: DC
  Administered 2021-09-01 – 2021-09-04 (×3): 1000 mL

## 2021-09-01 MED ORDER — PROSOURCE TF PO LIQD
45.0000 mL | Freq: Two times a day (BID) | ORAL | Status: DC
  Administered 2021-09-01: 45 mL
  Filled 2021-09-01: qty 45

## 2021-09-01 MED ORDER — SODIUM CHLORIDE 0.9 % IV SOLN: INTRAVENOUS | Status: DC | PRN

## 2021-09-01 MED ORDER — MAGNESIUM SULFATE 2 GM/50ML IV SOLN
2.0000 g | Freq: Once | INTRAVENOUS | Status: AC
Start: 2021-09-01 — End: 2021-09-01
  Administered 2021-09-01: 2 g via INTRAVENOUS
  Filled 2021-09-01: qty 50

## 2021-09-01 MED ORDER — INSULIN GLARGINE-YFGN 100 UNIT/ML ~~LOC~~ SOLN
10.0000 [IU] | Freq: Every day | SUBCUTANEOUS | Status: DC
  Administered 2021-09-01 – 2021-09-02 (×2): 10 [IU] via SUBCUTANEOUS
  Filled 2021-09-01 (×3): qty 0.1

## 2021-09-01 MED ORDER — FENTANYL CITRATE PF 50 MCG/ML IJ SOSY
25.0000 ug | PREFILLED_SYRINGE | INTRAMUSCULAR | Status: DC | PRN
  Administered 2021-09-01 – 2021-09-02 (×9): 50 ug via INTRAVENOUS
  Filled 2021-09-01: qty 1
  Filled 2021-09-01: qty 2
  Filled 2021-09-01 (×7): qty 1

## 2021-09-01 NOTE — Consult Note (Signed)
[name] KitchenANTICOAGULATION CONSULT NOTE  ? ?Pharmacy Consult for Heparin dosing ?Indication: ACS ? ?No Known Allergies ? ?Patient Measurements: ?Height: '5\' 4"'$  (162.6 cm) ?Weight: 84.5 kg (186 lb 4.6 oz) ?IBW/kg (Calculated) : 54.7 ?Heparin Dosing Weight: 70.9kg ? ?Vital Signs: ?Temp: 98.2 ?F (36.8 ?C) (03/28 0700) ?Temp Source: Bladder (03/28 0700) ?Pulse Rate: 76 (03/28 0749) ? ?Labs: ?Recent Labs  ?  08/20/2021 ?0801 08/20/2021 ?0904 09/01/2021 ?1033 09/01/2021 ?1146 08/08/2021 ?1524 08/23/2021 ?1525 08/24/2021 ?1827 08/26/2021 ?2028 08/11/2021 ?2259 09/01/21 ?0335  ?HGB 11.3*   < >  --    < >  --  8.2*  --  10.4*  --  9.6*  ?HCT 39.7   < >  --    < >  --  24.0*  --  32.0*  --  29.2*  ?PLT 185  --   --   --   --   --   --  111*  --  97*  ?LABPROT 15.1  --   --   --   --   --   --   --   --   --   ?INR 1.2  --   --   --   --   --   --   --   --   --   ?HEPARINUNFRC  --   --   --   --   --   --  0.53  --   --  0.54  ?CREATININE 1.26*  --  1.25*  --   --   --   --   --  1.53* 1.51*  ?TROPONINIHS 95*  --  797*  --  3,883*  --   --   --   --   --   ? < > = values in this interval not displayed.  ? ? ? ?Estimated Creatinine Clearance: 37 mL/min (A) (by C-G formula based on SCr of 1.51 mg/dL (H)). ? ? ?Medical History: ?Past Medical History:  ?Diagnosis Date  ? Bilateral carotid artery stenosis   ? vascular-- Dagoberto Ligas PA --- last duplex in epic 10-06-2020 right ICA 60-79% and left ICA 40-59%  ? Bladder outlet obstruction   ? self caths  ? Bladder wall thickening   ? Blood transfusion without reported diagnosis   ? Cardiac arrest ([place]) 08/26/2021  ? Cardiogenic shock ([place]) 08/29/2021  ? CHB (complete heart block) ([place]) 08/20/2021  ? Coronary artery disease   ? cardiologist--- dr Gwenlyn Found---- cath 03-25-2020 showed proxRCA 75%, distal RCA 50%, and midLAD 60%,  managed medically  ? Detrusor overactivity   ? urinary bladder  ? Esophageal varices (HCC)   ? followed by dr Henrene Pastor---  nonbleeding  ? Fibroid, uterine   ? Full dentures   ? Hepatic cirrhosis  due to chronic hepatitis C infection ([place])   ? followed by [place] NP---  s/p treated chronic hep c with cure and complicated by portal hypertension and esophageal varies  ? History of CVA (cerebrovascular accident) without residual deficits 06/2016  ? neurologist--- Frann Rider---  hospital admission 06-27-2016 right [place] infart with  left MCA aneurysm,  no residual per pt  ? History of hepatitis C   ? post treatement and cured  ? Hyperlipidemia   ? Hypertension   ? IDA (iron deficiency anemia)   ? 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 left MCA ;  followed by neurologist  ? Peripheral vascular disease ([place])   ? Self-catheterizes urinary bladder   ?  Stroke [facility]) 2018  ? Thrombocytopenia ([place]) 12/25/2014  ? Type 2 diabetes mellitus ([place])   ? followed by pcp---- (01-14-2021 pt stated check blood sugar at home twice weekly fasting, did not know average)  ? Vaginal atrophy   ? ?Assessment: ?48 YOF brought in by EMS with acute onset SOB and subsequent cardiac arrest after arrival to ED. ACLS performed ~10 minutes before ROSC. PMH significant for cirrhosis, esophageal varices (no GIB), CVA, CAD, DM, HTN, HLD, PVD, non-ruptured cerebral aneurysm neuroendocrine tumor, and EtOH+cocaine abuse. No anticoagulation prior to admission. Pharmacy consulted for heparin.   ? ? ?Heparin level 0.54 is therapeutic on 1000 units/hr. ? ? ?Goal of Therapy:  ?Heparin level 0.3-0.7 units/ml ?Monitor platelets by anticoagulation protocol: Yes ?  ?Plan:  ?Continue Heparin infusion at 1000 units/hr ?Monitor daily heparin level, CBC ?Monitor for signs/symptoms of bleeding  ? ?Hildred Laser, PharmD ?Clinical Pharmacist ?**Pharmacist phone directory can now be found on [email or web address] ([email or web address]).  Listed under [place]. ? ? ? ?

## 2021-09-01 NOTE — Progress Notes (Addendum)
? ?Progress Note ? ?Patient Name: Deborah Wise ?Date of Encounter: 09/01/2021 ? ?[facility] Cardiologist: Candee Furbish, MD  ? ?Subjective  ? ?Intubated and sedated ? ?Inpatient Medications  ?  ?Scheduled Meds: ? chlorhexidine gluconate (MEDLINE KIT)  15 mL Mouth Rinse BID  ? Chlorhexidine Gluconate Cloth  6 each Topical Q0600  ? docusate  100 mg Per Tube BID  ? insulin aspart  2-6 Units Subcutaneous Q4H  ? ipratropium-albuterol  3 mL Nebulization Q4H  ? mouth rinse  15 mL Mouth Rinse 10 times per day  ? pantoprazole sodium  40 mg Per Tube Daily  ? polyethylene glycol  17 g Per Tube Daily  ? sodium bicarbonate  100 mEq Intravenous Once  ? ?Continuous Infusions: ? amiodarone 30 mg/hr (09/01/21 0500)  ? ampicillin-sulbactam (UNASYN) IV Stopped (09/01/21 3532)  ? heparin 1,000 Units/hr (09/01/21 0743)  ? norepinephrine (LEVOPHED) Adult infusion Stopped (09/01/21 0141)  ? propofol (DIPRIVAN) infusion 45 mcg/kg/min (09/01/21 0543)  ?  sodium bicarbonate (isotonic) infusion in sterile water 125 mL/hr at 09/01/21 0500  ? ?PRN Meds: ?acetaminophen (TYLENOL) oral liquid 160 mg/5 mL, docusate, fentaNYL (SUBLIMAZE) injection, fentaNYL (SUBLIMAZE) injection, fentaNYL (SUBLIMAZE) injection, labetalol, polyethylene glycol  ? ?Vital Signs  ?  ?Vitals:  ? 09/01/21 0630 09/01/21 0645 09/01/21 0700 09/01/21 0749  ?BP:      ?Pulse: 80 81 81 76  ?Resp: (!) 25 (!) 26  (!) 25  ?Temp:   98.2 ?F (36.8 ?C)   ?TempSrc:   Bladder   ?SpO2: 99% 97% 99% 100%  ?Weight:      ?Height:      ? ? ?Intake/Output Summary (Last 24 hours) at 09/01/2021 0838 ?Last data filed at 09/01/2021 0600 ?Gross per 24 hour  ?Intake 3869.83 ml  ?Output 1100 ml  ?Net 2769.83 ml  ? ? ?  09/01/2021  ?  5:00 AM 08/21/2021  ? 10:00 AM 08/05/2021  ?  9:00 AM  ?Last 3 Weights  ?Weight (lbs) 186 lb 4.6 oz 169 lb 15.6 oz 170 lb  ?Weight (kg) 84.5 kg 77.1 kg 77.111 kg  ?   ? ?Telemetry  ?  ?NSR 70-80s - Personally Reviewed ? ?ECG  ?  ?No new ECG, will order for today -  Personally Reviewed ? ?Physical Exam  ? ?GEN: Intubated, sedated ?Neck: No JVD ?Cardiac: RRR, no murmurs ?Respiratory: Mechanical breath sounds ?GI: Soft, nontender ?MS: No edema ?Neuro:  Sedated ?Psych: Unable to assess ? ?Labs  ?  ?High Sensitivity Troponin:   ?Recent Labs  ?Lab 08/27/2021 ?0801 08/05/2021 ?1033 08/24/2021 ?1524  ?TROPONINIHS 95* 797* 3,883*  ?   ?Chemistry ?Recent Labs  ?Lab 08/18/2021 ?0801 08/28/2021 ?0904 08/08/2021 ?1033 08/18/2021 ?1146 08/24/2021 ?1525 08/30/2021 ?2259 09/01/21 ?0335  ?NA 137   < > 139   < > 144 136 136  ?K 4.6   < > 3.2*   < > 2.3* 3.9 3.5  ?CL 109  --  108  --   --  101 99  ?CO2 8*  --  21*  --   --  20* 23  ?GLUCOSE 396*  --  316*  --   --  230* 226*  ?BUN 13  --  13  --   --  21 21  ?CREATININE 1.26*  --  1.25*  --   --  1.53* 1.51*  ?CALCIUM 8.9  --  8.7*  --   --  8.5* 8.3*  ?MG  --   --   --   --   --   --  1.8  ?PROT 6.3*  --   --   --   --   --   --   ?ALBUMIN 3.0*  --   --   --   --   --   --   ?AST 46*  --   --   --   --   --   --   ?ALT 18  --   --   --   --   --   --   ?ALKPHOS 79  --   --   --   --   --   --   ?BILITOT 1.0  --   --   --   --   --   --   ?GFRNONAA 46*  --  47*  --   --  37* 37*  ?ANIONGAP 20*  --  10  --   --  15 14  ? < > = values in this interval not displayed.  ?  ?Lipids  ?Recent Labs  ?Lab 09/01/21 ?0335  ?TRIG 46  ?  ?Hematology ?Recent Labs  ?Lab 09/02/2021 ?0801 08/05/2021 ?0904 08/30/2021 ?1525 08/30/2021 ?2028 09/01/21 ?0335  ?WBC 8.0  --   --  17.0* 14.9*  ?RBC 4.55  --   --  4.15 3.83*  ?HGB 11.3*   < > 8.2* 10.4* 9.6*  ?HCT 39.7   < > 24.0* 32.0* 29.2*  ?MCV 87.3  --   --  77.1* 76.2*  ?MCH 24.8*  --   --  25.1* 25.1*  ?MCHC 28.5*  --   --  32.5 32.9  ?RDW 18.9*  --   --  17.9* 18.2*  ?PLT 185  --   --  111* 97*  ? < > = values in this interval not displayed.  ? ?Thyroid No results for input(s): TSH, FREET4 in the last 168 hours.  ?BNP ?Recent Labs  ?Lab 09/01/21 ?0335  ?BNP 467.7*  ?  ?DDimer  ?Recent Labs  ?Lab 08/24/2021 ?0801  ?DDIMER 3.26*  ?   ? ?Radiology  ?  ?CT HEAD WO CONTRAST (5MM) ? ?Result Date: 08/23/2021 ?CLINICAL DATA:  Altered mental status EXAM: CT HEAD WITHOUT CONTRAST TECHNIQUE: Contiguous axial images were obtained from the base of the skull through the vertex without intravenous contrast. RADIATION DOSE REDUCTION: This exam was performed according to the departmental dose-optimization program which includes automated exposure control, adjustment of the mA and/or kV according to patient size and/or use of iterative reconstruction technique. COMPARISON:  12/10/2018 FINDINGS: Brain: No acute intracranial findings are seen in noncontrast CT brain. Cortical sulci are prominent. Ventricles are not dilated. There are no epidural or subdural fluid collections. Vascular: Unremarkable. Skull: Unremarkable. Sinuses/Orbits: There is mucosal thickening in the ethmoid sinus. Other: None IMPRESSION: No acute intracranial findings are seen in noncontrast CT brain. Chronic sinusitis. Electronically Signed   By: Elmer Picker M.D.   On: 08/30/2021 12:31  ? ?CT Angio Chest Pulmonary Embolism (PE) W or WO Contrast ? ?Result Date: 08/21/2021 ?CLINICAL DATA:  Syncope EXAM: CT ANGIOGRAPHY CHEST WITH CONTRAST TECHNIQUE: Multidetector CT imaging of the chest was performed using the standard protocol during bolus administration of intravenous contrast. Multiplanar CT image reconstructions and MIPs were obtained to evaluate the vascular anatomy. RADIATION DOSE REDUCTION: This exam was performed according to the departmental dose-optimization program which includes automated exposure control, adjustment of the mA and/or kV according to patient size and/or use of iterative reconstruction technique. CONTRAST:  122m OMNIPAQUE IOHEXOL 350 MG/ML  SOLN COMPARISON:  Chest radiograph done earlier today FINDINGS: Cardiovascular: Contrast density in the thoracic aorta is less than optimal to evaluate for dissection. Are no intraluminal filling defects in the central  pulmonary artery branches. Evaluation of small peripheral pulmonary artery branches is limited by extensive infiltrates in both lungs. There is ectasia of main pulmonary artery measuring 3.4 cm suggesting pulmonary arterial hypertension. There is dilation of left ventricular cavity. Mediastinum/Nodes: Endotracheal tube is noted in place. Enteric tube is noted traversing the thoracic esophagus. No significant lymphadenopathy seen in mediastinum. Lungs/Pleura: Extensive patchy ground-glass densities seen in both lungs. There are patchy alveolar infiltrates in the posterior lower lung fields. Small bilateral pleural effusions are seen. There is no pneumothorax. Upper Abdomen: Tip of enteric tube is seen in the antrum of the stomach. Musculoskeletal: Degenerative changes are noted in the thoracic spine. Review of the MIP images confirms the above findings. IMPRESSION: There is no evidence of central pulmonary artery embolism. Evaluation of small peripheral branches is limited by extensive infiltrates in both lungs. There is ectasia of main pulmonary artery suggesting pulmonary arterial hypertension. Extensive patchy ground-glass densities noted in both lungs. Alveolar densities seen in the posterior lower lung fields. Findings suggest atelectasis/pneumonia and possibly pulmonary edema. Small bilateral pleural effusions are seen. Electronically Signed   By: Elmer Picker M.D.   On: 09/01/2021 12:29  ? ?DG Chest Portable 1 View ? ?Result Date: 08/18/2021 ?CLINICAL DATA:  Post intubation, 71 year old female. EXAM: PORTABLE CHEST 1 VIEW COMPARISON:  Comparison is made with February 06, 2019. FINDINGS: EKG leads project over the chest. Pacer defibrillator pad over the LEFT chest. Endotracheal tube approximately 5 mm above the carina directed towards the RIGHT mainstem bronchus. Gastric tube courses through in off the field of view. Heart size remains enlarged. Graded opacity in the LEFT and RIGHT chest with patchy  bilateral interstitial and airspace opacities in the mid and lower chest. On limited assessment there is no acute skeletal finding. IMPRESSION: 1. Endotracheal tube is approximately 5 mm above the carina directed towards the R

## 2021-09-01 NOTE — Progress Notes (Signed)
Inpatient Diabetes Program Recommendations ? ?AACE/ADA: New Consensus Statement on Inpatient Glycemic Control (2015) ? ?Target Ranges:  Prepandial:   less than 140 mg/dL ?     Peak postprandial:   less than 180 mg/dL (1-2 hours) ?     Critically ill patients:  140 - 180 mg/dL  ? ?Lab Results  ?Component Value Date  ? GLUCAP 162 (H) 09/01/2021  ? HGBA1C 6.2 (H) 09/01/2021  ? ? ?Review of Glycemic Control ? Latest Reference Range & Units 08/17/2021 23:14 09/01/21 03:38 09/01/21 04:15 09/01/21 08:10  ?Glucose-Capillary 70 - 99 mg/dL 218 (H) 238 (H) 219 (H) 162 (H)  ?(H): Data is abnormally high ?Diabetes history: Type 2 DM ?Outpatient Diabetes medications: Metformin 500 mg QA ?Current orders for Inpatient glycemic control: Novolog 2-6 units Q4H ? ?Inpatient Diabetes Program Recommendations:   ? ?Consider adding Levemir 6 units BID.  ? ?Thanks, ?Bronson Curb, MSN, RNC-OB ?Diabetes Coordinator ?[phone] (8a-5p) ? ? ?

## 2021-09-01 NOTE — Progress Notes (Signed)
eLink Physician-Brief Progress Note ?Patient Name: Deborah Wise ?DOB: 12-02-1950 ?MRN: [id number] ? ? ?Date of Service ? 09/01/2021  ?HPI/Events of Note ? Patient with acute kidney injury and oliguria, BNP 467, suggesting there is room for volume expansion.  ?eICU Interventions ? LR 500 ml iv fluid bolus ordered x 1.  ? ? ? ?  ? ?Kerry Kass Denario Bagot ?09/01/2021, 5:51 AM ?

## 2021-09-01 NOTE — Progress Notes (Signed)
eLink Physician-Brief Progress Note ?Patient Name: Deborah Wise ?DOB: [birth year] ?MRN: [id number] ? ? ?Date of Service ? 09/01/2021  ?HPI/Events of Note ? BMP reviewed, K+ normalized and CO2 now 20, urine output down to 20 / hour x several hours, patient has AKI with GFR down to 37.  ?eICU Interventions ? Will check a BNP to assess volume status.  ? ? ? ?  ? ?Deborah Wise ?09/01/2021, 2:44 AM ?

## 2021-09-01 NOTE — Progress Notes (Addendum)
? ?NAME:  LESLEYANNE POLITTE, MRN:  [id number], DOB:  [birth year], LOS: 1 ?ADMISSION DATE:  08/18/2021, CONSULTATION DATE:  3/27 ?REFERRING MD:  Alvino Chapel, CHIEF COMPLAINT:  cardiac arrest   ? ?History of Present Illness:  ?72 year old female patient with extensive history as outlined below.  Presented to the emergency room on 3/27 status post cardiac arrest.  Per report he was eating breakfast the morning of 3/27, developed rather acute onset of shortness of breath for which EMS was summoned.  On fire department arrival pulse oximetry in the 30s and only responding to painful stimulus.  Initially attempted CPAP but patient ripped it off.  He was given magnesium and Solu-Medrol by EMS given prior history of anaphylaxis.  Upon arrival to the emergency room was bradycardic and subsequently went into cardiac arrest ACLS algorithm initiated she was intubated, received epinephrine and atropine, initially cardiac rhythm appeared to be third-degree heart block.  Post resuscitation patient was with narrow complex regular tachycardia, with mild ST elevation in inferior leads. PCCM asked to admit. Cardiology at bedside  ? ? 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  ?CAD ?DM w/ diabetic neuropathy  ?Colon polyps ?HTN  ?Carotid artery disease ?PVD ?Neurogenic bladder  ?Neuroendocrine tumor (documented in transplant notes.) ? ? ?Prior tubal ligation  ? ?Significant Hospital Events: ?Including procedures, antibiotic start and stop dates in addition to other pertinent events   ?3/27, EMS called.  Hypoxic on arrival and 30s.  Bradycardic and unresponsive, third-degree heart block and cardiac arrest on arrival to ER.  Time to return of spontaneous circulation estimated at 10 minutes.  Possible inferior wall ST elevation.  Intubated.   Right femoral central line placed, right radial A-line placed. CT head negative for acute findings.  CT angiogram of chest negative for pulmonary emboli findings consistent with pulmonary artery hypertension there was extensive patchy groundglass densities in both lungs.  She was started on IV Unasyn. ?3/28: Weaned off norepinephrine.  Acid-base improved, bicarbonate infusion stopped.  Mental status still quite depressed, only able to elicit cough when suctioning otherwise no response ? ?Interim History / Subjective:  ?Off from pressors ? ?Objective   ?Blood pressure (Abnormal) 94/59, pulse 76, temperature 98.2 ?F (36.8 ?C), temperature source Bladder, resp. rate (Abnormal) 25, height '5\' 4"'$  (1.626 m), weight 84.5 kg, SpO2 100 %. ?   ?Vent Mode: PRVC ?FiO2 (%):  [40 %-100 %] 40 % ?Set Rate:  [22 bmp-25 bmp] 22 bmp ?Vt Set:  [430 mL] 430 mL ?PEEP:  [8 cmH20-10 cmH20] 10 cmH20 ?Plateau Pressure:  [13 cmH20-20 cmH20] 20 cmH20  ? ?Intake/Output Summary (Last 24 hours) at 09/01/2021 0902 ?Last data filed at 09/01/2021 0600 ?Gross per 24 hour  ?Intake 3869.83 ml  ?Output 1100 ml  ?Net 2769.83 ml  ? ?Filed Weights  ? 08/17/2021 0900 08/16/2021 1000 09/01/21 0500  ?Weight: 77.1 kg 77.1 kg 84.5 kg  ? ? ?Examination: ?General 71 year old female patient remains on full ventilatory support ?HEENT normocephalic atraumatic pupils are equal and reactive but sluggish orally intubated no JVD mucous membranes moist ?Pulmonary: Some scattered rhonchi no accessory use currently, weaning PEEP/FiO2 slowly cardiac regular rate and rhythm currently heart rate 80s no murmur rub or gallop audible ?Abdomen soft nontender no organomegaly ?Extremities warm dry pulses palpable ?GU clear yellow ?Neuro comatose, I can elicit cough  with suctioning otherwise no response ?Resolved Hospital Problem list   ?Cardiogenic shock resolved ? ?Assessment & Plan:  ?Active Problems: ?  Atrial fibrillation with RVR ([place]) ?  History of diastolic dysfunction ?  Acute  respiratory failure ([place]) ?  Acute metabolic encephalopathy ?  Metabolic acidosis ?  AKI (acute kidney injury) ([place]) ?  Alteration in electrolyte and fluid balance ?  Hepatic cirrhosis due to chronic hepatitis C infection ([place]) ?  Bilateral carotid artery stenosis ?  Esophageal varices ([facility]) ?  History of CVA (cerebrovascular accident) without residual deficits ?  IDA (iron deficiency anemia) ?  Type 2 diabetes mellitus ([place]) ?  Hyperlipidemia ?  Lactic acidosis ?  Acute systolic heart failure ([place]) ?  MI (myocardial infarction) ([place]) ? ? ?Cardiac arrest: seemingly more likely 2/2 ACS w/ mild ST elevation on 12 lead ?-CT chest neg for PE ?Plan ?IV heparin  ?Tele  ?[place]  ?Add statin ?Slowly add goal-directed medical therapy over the course of the next few days ? ?AF w/ RVR. Did have brief CHB. Rhythm has been rapid and irregular since ROSC, has History of coronary artery disease, hypertension, and peripheral vascular disease. ?Plan ?Telemetry ?Systemic anticoagulation ?Rate control ? ?Acute systolic heart failure  ?Plan ?Telemetry monitoring ?Keep euvolemic ?Slowly add goal-directed medical therapy ? ?Acute hypoxic respiratory failure.  Mix of aspiration and pulmonary edema ?Plan ?Day 2 Unasyn  ?F/u pending cultures ?Vent support  ?VAP bundle ?PAD protocol RASS goal 0 to -1 ? ?Acute metabolic encephalopathy status post cardiac arrest ?She does have cough, when suctioned.  Downtime 10 minutes however arrest was witnessed ?Plan ?Supportive care ? ?Acute metabolic acidosis, secondary to severe lactic acidosis likely reflecting postcardiac arrest/CPR ?->acid base improved. Lactate cleared nicely  ?Plan ?Dc bicarb gtt ?Repeat afternoon chem  ?Avoid hypotension  ? ?AKI ?Slight improvement  ?Plan ?Strict I&O ?Am chem  ?Renal dose meds ? ? ?Fluid and electrolyte balance: Hypokalemia, and hypomagnesemia ?Plan ?Replace both potassium and magnesium ? ?History of diabetes with hyperglycemia ?Plan ?SSI  ?Hold metformin  ? ?History  of cirrhosis, felt secondary to hep C ?Plan ?Supportive care ?Universal precautions ?Am LFTs ? ? ?Best Practice (right click and "Reselect all SmartList Selections" daily)  ? ?Diet/type: tubefeeds ?DVT prophylaxis: systemic heparin ?GI prophylaxis: PPI ?Lines: Central line ?Foley:  Yes, and it is still needed ?Code Status:  full code ?Last date of multidisciplinary goals of care discussion [pending] ?Family updated at bedside.  ?Critical care time: 34 minutes  ?  ?Erick Colace ACNP-BC ?[place] ?Pager # [phone] OR # [phone] if no answer ? ? ? ? ? ?

## 2021-09-01 NOTE — Progress Notes (Signed)
Initial Nutrition Assessment ? ?DOCUMENTATION CODES:  ? ?Not applicable ? ?INTERVENTION:  ? ?Tube Feeding via OG:  ?Vital AF 1.2 at 60 ml/hr ?Pro-Source TF 45 mL daily ?This provides 1768 kcals, 119 g of protein and 1166 mL of free water ? ?TF regimen and propofol at current rate providing 2135 total kcal/day  ? ?NUTRITION DIAGNOSIS:  ? ?Inadequate oral intake related to acute illness as evidenced by NPO status. ? ?GOAL:  ? ?Patient will meet greater than or equal to 90% of their needs ? ?MONITOR:  ? ?Vent status, Labs, Weight trends, TF tolerance ? ?REASON FOR ASSESSMENT:  ? ?Consult, Ventilator ?Enteral/tube feeding initiation and management ? ?ASSESSMENT:  ? ?71 yo female admitted post cardiac arrest likley 2/2 ACS requiring intubation. PMH includes cirrhosis and hepatitis C, h/o esophageal varices, prior EtOH and cocaine abuse, hx CVA, DM, HTN, CAD, neuroendocrine tumor.  ? ?3/27 Admitted, Intubated ?3/28 Off pressors ? ?Pt remains on vent support ?Propofol: 13.9 ml/hr ? ?Pt lives at home with her husband. Pastor at bedside on visit today.  ? ?OG tube in place; per chest xray, tube courses below diaphragm ? ?Current wt 84.5 kg; weight of 77.1 kg on admission; noted previous weights from the last 6 months around 76-77 kg ? ?Pt is edentulous. Unable to obtain further diet and weight history at this time ? ?Labs: CBGs 162-238 (ICU goal 140-180) ?Meds:colace, lasix, ss novolog, miralax, KCl ? ?NUTRITION - FOCUSED PHYSICAL EXAM: ? ?Flowsheet Row Most Recent Value  ?Orbital Region Mild depletion  ?Upper Arm Region No depletion  ?Thoracic and Lumbar Region No depletion  ?Buccal Region Unable to assess  ?Temple Region Mild depletion  ?Clavicle Bone Region No depletion  ?Clavicle and Acromion Bone Region No depletion  ?Scapular Bone Region No depletion  ?Dorsal Hand No depletion  ?Patellar Region No depletion  ?Anterior Thigh Region No depletion  ?Posterior Calf Region No depletion  ?Edema (RD Assessment) Mild  ? ?   ? ? ?Diet Order:   ?Diet Order   ? ?       ?  Diet NPO time specified  Diet effective now       ?  ? ?  ?  ? ?  ? ? ?EDUCATION NEEDS:  ? ?Not appropriate for education at this time ? ?Skin:  Skin Assessment: Reviewed RN Assessment ? ?Last BM:  PTA ? ?Height:  ? ?Ht Readings from Last 1 Encounters:  ?09/01/2021 '5\' 4"'$  (1.626 m)  ? ? ?Weight:  ? ?Wt Readings from Last 1 Encounters:  ?09/01/21 84.5 kg  ? ? ?BMI:  Body mass index is 31.98 kg/m?. ? ?Estimated Nutritional Needs:  ? ?Kcal:  1650-1925 kcals ? ?Protein:  110-130 g ? ?Fluid:  >/= 1.7 L ? ?Kerman Passey MS, RDN, LDN, CNSC ?Registered Dietitian III ?Clinical Nutrition ?RD Pager and On-Call Pager Number Located in [place]  ? ?

## 2021-09-02 ENCOUNTER — Inpatient Hospital Stay (HOSPITAL_COMMUNITY): Payer: Medicare Other

## 2021-09-02 DIAGNOSIS — J9601 Acute respiratory failure with hypoxia: Secondary | ICD-10-CM

## 2021-09-02 DIAGNOSIS — Z66 Do not resuscitate: Secondary | ICD-10-CM

## 2021-09-02 DIAGNOSIS — I469 Cardiac arrest, cause unspecified: Secondary | ICD-10-CM | POA: Diagnosis not present

## 2021-09-02 DIAGNOSIS — J80 Acute respiratory distress syndrome: Secondary | ICD-10-CM

## 2021-09-02 LAB — BASIC METABOLIC PANEL
Anion gap: 11 (ref 5–15)
Anion gap: 11 (ref 5–15)
Anion gap: 13 (ref 5–15)
BUN: 30 mg/dL — ABNORMAL HIGH (ref 8–23)
BUN: 31 mg/dL — ABNORMAL HIGH (ref 8–23)
BUN: 38 mg/dL — ABNORMAL HIGH (ref 8–23)
CO2: 25 mmol/L (ref 22–32)
CO2: 25 mmol/L (ref 22–32)
CO2: 24 mmol/L (ref 22–32)
Calcium: 7.9 mg/dL — ABNORMAL LOW (ref 8.9–10.3)
Calcium: 7.9 mg/dL — ABNORMAL LOW (ref 8.9–10.3)
Calcium: 7.6 mg/dL — ABNORMAL LOW (ref 8.9–10.3)
Chloride: 99 mmol/L (ref 98–111)
Chloride: 98 mmol/L (ref 98–111)
Chloride: 98 mmol/L (ref 98–111)
Creatinine, Ser: 1.58 mg/dL — ABNORMAL HIGH (ref 0.44–1.00)
Creatinine, Ser: 1.77 mg/dL — ABNORMAL HIGH (ref 0.44–1.00)
Creatinine, Ser: 2.13 mg/dL — ABNORMAL HIGH (ref 0.44–1.00)
GFR, Estimated: 35 mL/min — ABNORMAL LOW (ref >60)
GFR, Estimated: 31 mL/min — ABNORMAL LOW (ref >60)
GFR, Estimated: 25 mL/min — ABNORMAL LOW (ref >60)
Glucose, Bld: 167 mg/dL — ABNORMAL HIGH (ref 70–99)
Glucose, Bld: 201 mg/dL — ABNORMAL HIGH (ref 70–99)
Glucose, Bld: 174 mg/dL — ABNORMAL HIGH (ref 70–99)
Potassium: 4 mmol/L (ref 3.5–5.1)
Potassium: 4.1 mmol/L (ref 3.5–5.1)
Potassium: 4.1 mmol/L (ref 3.5–5.1)
Sodium: 135 mmol/L (ref 135–145)
Sodium: 134 mmol/L — ABNORMAL LOW (ref 135–145)
Sodium: 135 mmol/L (ref 135–145)

## 2021-09-02 LAB — POCT I-STAT 7, (LYTES, BLD GAS, ICA,H+H)
Acid-Base Excess: 2 mmol/L (ref 0.0–2.0)
Bicarbonate: 28.5 mmol/L — ABNORMAL HIGH (ref 20.0–28.0)
Calcium, Ion: 1.09 mmol/L — ABNORMAL LOW (ref 1.15–1.40)
HCT: 29 % — ABNORMAL LOW (ref 36.0–46.0)
Hemoglobin: 9.9 g/dL — ABNORMAL LOW (ref 12.0–15.0)
O2 Saturation: 98 %
Patient temperature: 99
Potassium: 4.2 mmol/L (ref 3.5–5.1)
Sodium: 134 mmol/L — ABNORMAL LOW (ref 135–145)
TCO2: 30 mmol/L (ref 22–32)
pCO2 arterial: 56.2 mmHg — ABNORMAL HIGH (ref 32–48)
pH, Arterial: 7.313 — ABNORMAL LOW (ref 7.35–7.45)
pO2, Arterial: 119 mmHg — ABNORMAL HIGH (ref 83–108)

## 2021-09-02 LAB — CBC
HCT: 26.4 % — ABNORMAL LOW (ref 36.0–46.0)
Hemoglobin: 8.6 g/dL — ABNORMAL LOW (ref 12.0–15.0)
MCH: 25 pg — ABNORMAL LOW (ref 26.0–34.0)
MCHC: 32.6 g/dL (ref 30.0–36.0)
MCV: 76.7 fL — ABNORMAL LOW (ref 80.0–100.0)
Platelets: 89 10*3/uL — ABNORMAL LOW (ref 150–400)
RBC: 3.44 MIL/uL — ABNORMAL LOW (ref 3.87–5.11)
RDW: 18.6 % — ABNORMAL HIGH (ref 11.5–15.5)
WBC: 14.2 10*3/uL — ABNORMAL HIGH (ref 4.0–10.5)
nRBC: 0.3 % — ABNORMAL HIGH (ref 0.0–0.2)

## 2021-09-02 LAB — CULTURE, RESPIRATORY W GRAM STAIN: Culture: NORMAL

## 2021-09-02 LAB — MAGNESIUM
Magnesium: 2.4 mg/dL (ref 1.7–2.4)
Magnesium: 2.4 mg/dL (ref 1.7–2.4)

## 2021-09-02 LAB — HEPARIN LEVEL (UNFRACTIONATED)
Heparin Unfractionated: 0.19 IU/mL — ABNORMAL LOW (ref 0.30–0.70)
Heparin Unfractionated: 0.24 IU/mL — ABNORMAL LOW (ref 0.30–0.70)

## 2021-09-02 LAB — GLUCOSE, CAPILLARY
Glucose-Capillary: 145 mg/dL — ABNORMAL HIGH (ref 70–99)
Glucose-Capillary: 163 mg/dL — ABNORMAL HIGH (ref 70–99)
Glucose-Capillary: 176 mg/dL — ABNORMAL HIGH (ref 70–99)
Glucose-Capillary: 182 mg/dL — ABNORMAL HIGH (ref 70–99)
Glucose-Capillary: 192 mg/dL — ABNORMAL HIGH (ref 70–99)

## 2021-09-02 LAB — PHOSPHORUS
Phosphorus: 3.8 mg/dL (ref 2.5–4.6)
Phosphorus: 5.3 mg/dL — ABNORMAL HIGH (ref 2.5–4.6)

## 2021-09-02 LAB — BRAIN NATRIURETIC PEPTIDE: B Natriuretic Peptide: 404 pg/mL — ABNORMAL HIGH (ref 0.0–100.0)

## 2021-09-02 LAB — CALCIUM, IONIZED: Calcium, Ionized, Serum: 4.6 mg/dL (ref 4.5–5.6)

## 2021-09-02 LAB — PATHOLOGIST SMEAR REVIEW

## 2021-09-02 LAB — PROCALCITONIN: Procalcitonin: 7.35 ng/mL

## 2021-09-02 MED ORDER — CARVEDILOL 12.5 MG PO TABS: 12.5000 mg | ORAL_TABLET | Freq: Two times a day (BID) | ORAL | Status: DC

## 2021-09-02 MED ORDER — FENTANYL BOLUS VIA INFUSION
50.0000 ug | INTRAVENOUS | Status: DC | PRN
  Filled 2021-09-02: qty 100

## 2021-09-02 MED ORDER — VECURONIUM BROMIDE 10 MG IV SOLR
INTRAVENOUS | Status: AC
  Administered 2021-09-02: 6.8 mg via INTRAVENOUS
  Filled 2021-09-02: qty 10

## 2021-09-02 MED ORDER — ISOSORBIDE MONONITRATE ER 30 MG PO TB24
15.0000 mg | ORAL_TABLET | Freq: Every day | ORAL | Status: DC
Start: 2021-09-02 — End: 2021-09-02

## 2021-09-02 MED ORDER — DOCUSATE SODIUM 50 MG/5ML PO LIQD
100.0000 mg | Freq: Two times a day (BID) | ORAL | Status: DC
  Administered 2021-09-02: 100 mg
  Filled 2021-09-02: qty 10

## 2021-09-02 MED ORDER — SENNOSIDES 8.8 MG/5ML PO SYRP
5.0000 mL | ORAL_SOLUTION | Freq: Two times a day (BID) | ORAL | Status: DC
  Administered 2021-09-02 – 2021-09-03 (×4): 5 mL
  Filled 2021-09-02 (×4): qty 5

## 2021-09-02 MED ORDER — HYDRALAZINE HCL 25 MG PO TABS
25.0000 mg | ORAL_TABLET | Freq: Three times a day (TID) | ORAL | Status: DC
  Administered 2021-09-02 – 2021-09-03 (×2): 25 mg
  Filled 2021-09-02 (×2): qty 1

## 2021-09-02 MED ORDER — FENTANYL 2500MCG IN NS 250ML (10MCG/ML) PREMIX INFUSION
50.0000 ug/h | INTRAVENOUS | Status: DC
  Administered 2021-09-02 – 2021-09-04 (×2): 50 ug/h via INTRAVENOUS
  Filled 2021-09-02 (×2): qty 250

## 2021-09-02 MED ORDER — POLYETHYLENE GLYCOL 3350 17 G PO PACK
17.0000 g | PACK | Freq: Every day | ORAL | Status: DC
  Administered 2021-09-02 – 2021-09-03 (×2): 17 g
  Filled 2021-09-02: qty 1

## 2021-09-02 MED ORDER — ISOSORBIDE DINITRATE 10 MG PO TABS
5.0000 mg | ORAL_TABLET | Freq: Three times a day (TID) | ORAL | Status: DC
Start: 2021-09-02 — End: 2021-09-04
  Administered 2021-09-02 – 2021-09-04 (×7): 5 mg
  Filled 2021-09-02 (×7): qty 1

## 2021-09-02 MED ORDER — FENTANYL CITRATE PF 50 MCG/ML IJ SOSY
50.0000 ug | PREFILLED_SYRINGE | Freq: Once | INTRAMUSCULAR | Status: AC
  Administered 2021-09-02: 50 ug via INTRAVENOUS

## 2021-09-02 MED ORDER — ARTIFICIAL TEARS OPHTHALMIC OINT
1.0000 "application " | TOPICAL_OINTMENT | Freq: Three times a day (TID) | OPHTHALMIC | Status: DC
  Administered 2021-09-02 – 2021-09-04 (×6): 1 via OPHTHALMIC
  Filled 2021-09-02: qty 3.5

## 2021-09-02 MED ORDER — FUROSEMIDE 10 MG/ML IJ SOLN
80.0000 mg | Freq: Once | INTRAMUSCULAR | Status: AC
  Administered 2021-09-02: 80 mg via INTRAVENOUS
  Filled 2021-09-02: qty 8

## 2021-09-02 MED ORDER — VECURONIUM BROMIDE 10 MG IV SOLR: 0.0800 mg/kg | INTRAVENOUS | Status: DC | PRN

## 2021-09-02 NOTE — Progress Notes (Signed)
RT NOTE: RT transported patient on ventilator from room 2H25 to MRI and back to room 0K59 with no complications. Vitals are stable. RT will continue to monitor.  ?

## 2021-09-02 NOTE — Progress Notes (Addendum)
? ?NAME:  Deborah Wise, MRN:  [id number], DOB:  1951-03-17, LOS: 2 ?ADMISSION DATE:  08/07/2021, CONSULTATION DATE:  3/27 ?REFERRING MD:  Alvino Chapel, CHIEF COMPLAINT:  cardiac arrest   ? ?History of Present Illness:  ?71 year old female patient with extensive history as outlined below.  Presented to the emergency room on 3/27 status post cardiac arrest.  Per report he was eating breakfast the morning of 3/27, developed rather acute onset of shortness of breath for which EMS was summoned.  On fire department arrival pulse oximetry in the 30s and only responding to painful stimulus.  Initially attempted CPAP but patient ripped it off.  He was given magnesium and Solu-Medrol by EMS given prior history of anaphylaxis.  Upon arrival to the emergency room was bradycardic and subsequently went into cardiac arrest ACLS algorithm initiated she was intubated, received epinephrine and atropine, initially cardiac rhythm appeared to be third-degree heart block.  Post resuscitation patient was with narrow complex regular tachycardia, with mild ST elevation in inferior leads. PCCM asked to admit. Cardiology at bedside  ? ? 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  ?CAD ?DM w/ diabetic neuropathy  ?Colon polyps ?HTN  ?Carotid artery disease ?PVD ?Neurogenic bladder  ?Neuroendocrine tumor (documented in transplant notes.) ? ? ?Prior tubal ligation  ? ?Significant Hospital Events: ?Including procedures, antibiotic start and stop dates in addition to other pertinent events   ?3/27, EMS called.  Hypoxic on arrival and 30s.  Bradycardic and unresponsive, third-degree heart block and cardiac arrest on arrival to ER.  Time to return of spontaneous circulation estimated at 10 minutes.  Possible inferior wall ST elevation.  Intubated.   Right femoral central line placed, right radial A-line placed. CT head negative for acute findings.  CT angiogram of chest negative for pulmonary emboli findings consistent with pulmonary artery hypertension there was extensive patchy groundglass densities in both lungs.  She was started on IV Unasyn. ECHO w EF 35-40% left ventricle demonstrates regional wall motion abnormality left atrial size mildly dilated mild mitral valve regurg IVC dilated ?3/28: Weaned off norepinephrine.  Acid-base improved, bicarbonate infusion stopped.  Mental status still quite depressed, only able to elicit cough when suctioning otherwise no response ?3/29: Pulmonary edema on chest x-ray getting diuresis, needing 100% FIO2 and increased PEEP support. + NMB renal function slightly worse.  Mental status not improved. Added IMDUR and hydralazine.  EEG ordered. Made DNR ? ?Interim History / Subjective:  ?No neurological improvement ?Still has significant work of breathing ? ?Objective   ?Blood pressure (Abnormal) 94/59, pulse (Abnormal) 102, temperature 99 ?F (37.2 ?C), temperature source Axillary, resp. rate (Abnormal) 27, height '5\' 4"'$  (1.626 m), weight 84.5 kg, SpO2 95 %. ?   ?Vent Mode: PRVC ?FiO2 (%):  [50 %-100 %] 100 % ?Set Rate:  [22 bmp] 22 bmp ?Vt Set:  [430 mL] 430 mL ?PEEP:  [10 cmH20-12 cmH20] 12 cmH20 ?Plateau Pressure:  [15 cmH20-25 cmH20] 24 cmH20  ? ?Intake/Output Summary (Last 24 hours) at 09/02/2021 1012 ?Last data filed at 09/02/2021 0800 ?Gross per 24 hour  ?Intake 2764.17 ml  ?Output 1825 ml  ?Net 939.17 ml  ? ?Filed Weights  ? 08/18/2021 0900 08/15/2021 1000 09/01/21 0500  ?Weight: 77.1 kg 77.1 kg 84.5 kg  ? ? ?Examination: ?General: 71 year old female patient who remains comatose on  full ventilator support  ?HEENT normocephalic atraumatic mucous membranes moist orally intubated  ?Pulmonary coarse scattered rhonchi with tactile fremitus  ?Pcxr w/ bilateral airspace disease. Looks a little worse and c/w edema ?Cardiac regular  rate and rhythm ?Abdomen soft not tender ?Extremities warm ?Neuro pupils equal reactive, not moving extremities.  I can elicit a cough and that the only neurological response I can get, she has no response to pain, no gag ?GU Foley catheter with clear yellow urine ?Resolved Hospital Problem list   ?Cardiogenic shock resolved ?Metabolic acidosis ?Lactic acidosis  ?Assessment & Plan:  ?Active Problems: ?  Atrial fibrillation with RVR ([place]) ?  History of diastolic dysfunction ?  Acute respiratory failure ([place]) ?  Acute metabolic encephalopathy ?  Metabolic acidosis ?  AKI (acute kidney injury) ([place]) ?  Alteration in electrolyte and fluid balance ?  Hepatic cirrhosis due to chronic hepatitis C infection ([place]) ?  Bilateral carotid artery stenosis ?  Esophageal varices (HCC) ?  History of CVA (cerebrovascular accident) without residual deficits ?  IDA (iron deficiency anemia) ?  Type 2 diabetes mellitus ([place]) ?  Hyperlipidemia ?  Lactic acidosis ?  Acute combined systolic and diastolic heart failure ([place]) ?  MI (myocardial infarction) ([place]) ?  ARDS (adult respiratory distress syndrome) ([place]) ?  DNR (do not resuscitate) ? ? ?Cardiac arrest: seemingly more likely 2/2 ACS  ?-CT chest neg for PE ?Plan ?Slowly adding GSMT ?Ischemia eval dependent on neurological recovery  ? ?AF w/ RVR. Did have brief CHB. Rhythm has been rapid and irregular since ROSC, has History of coronary artery disease, hypertension, and peripheral vascular disease. ?Plan ?Rate control ?amiodarone ?Cont AC  ? ?Acute systolic and diastolic heart failure (new dx) EF 34-40% ?Plan ?Tele ?Cards adding IMDUR and Hydralazine  ?Tele  ? ?Acute hypoxic respiratory failure w/ ARDS and pulmonary edema c/b aspiration PNA ?CXR worse. O2 needs higher ?Plan ?Cont full vent support ?Inc peep to 14 ?IV lasix  ?Day 3 unasyn (awaiting cultures) ?VAP bundle ?PAD protocol (RASS goal -3) ?Repeat abg ?AM CXR ?PRN Vec ?Consider Prone therapy if no evidence of anoxic brain injury   ? ?Acute metabolic encephalopathy status post cardiac arrest ?She does have cough, when suctioned.  Downtime 10 minutes however arrest was witnessed ?Plan ?Cont supportive care.  ?Avoiding fevers ?Try to wean sedation  ?MRI brain ?EEG today  ? ?AKI ?She did have slight improvement, however her creatinine has  worsened I did diuresis her yesterday.  Had nml renal fxn in January  ?Plan ?Renal dose medications ?Strict intake output ?A.m. chem ?Still looks like needs diuresis. Will repeat lasix again today; repeat Cr this afternoon. If cont to rise will need to give a day to equilibrate ? ?Fluid and electrolyte balance: Intermittent ?Plan ?Monitor and replace as needed  ? ?History of diabetes with hyperglycemia ?Plan ?Holding metformin ?Continue sliding scale insulin ?Semglee 10u daily ? ?History of cirrhosis, felt secondary to hep C ?Plan ?Continue universal precautions ?Check a.m. LFTs ? ?Best Practice (right click and "Reselect all SmartList Selections" daily)  ? ?Diet/type: tubefeeds ?DVT prophylaxis: systemic heparin ?GI prophylaxis: PPI ?Lines: Central line ?Foley:  Yes, and it is still needed ?Code Status:  full code ?Last date of multidisciplinary goals of care discussion [pending] ?Family updated at bedside. Will get MRI and then need to discuss goals of care.  ?Critical care time: 32 min  ?  ?Erick Colace ACNP-BC ?[place] ?Pager # [phone] OR # [phone] if no answer ? ? ? ? ? ?

## 2021-09-02 NOTE — Progress Notes (Addendum)
? ?Progress Note ? ?Patient Name: Deborah Wise ?Date of Encounter: 09/02/2021 ? ?[facility] Cardiologist: Candee Furbish, MD  ? ?Subjective  ? ?Intubated and sedated.  Worsening hypoxia, on FiO2 100% PEEP 12 this morning ? ?Inpatient Medications  ?  ?Scheduled Meds: ? aspirin  81 mg Per Tube Daily  ? atorvastatin  80 mg Per Tube Daily  ? carvedilol  12.5 mg Per Tube BID WC  ? chlorhexidine gluconate (MEDLINE KIT)  15 mL Mouth Rinse BID  ? Chlorhexidine Gluconate Cloth  6 each Topical Q0600  ? docusate  100 mg Per Tube BID  ? feeding supplement (PROSource TF)  45 mL Per Tube Daily  ? furosemide  80 mg Intravenous Once  ? insulin aspart  2-6 Units Subcutaneous Q4H  ? insulin glargine-yfgn  10 Units Subcutaneous Daily  ? ipratropium-albuterol  3 mL Nebulization Q4H  ? mouth rinse  15 mL Mouth Rinse 10 times per day  ? pantoprazole sodium  40 mg Per Tube Daily  ? polyethylene glycol  17 g Per Tube Daily  ? ?Continuous Infusions: ? sodium chloride    ? amiodarone 30 mg/hr (09/02/21 0800)  ? ampicillin-sulbactam (UNASYN) IV Stopped (09/02/21 0206)  ? feeding supplement (VITAL AF 1.2 CAL) 50 mL/hr at 09/02/21 0600  ? heparin 1,000 Units/hr (09/02/21 0800)  ? norepinephrine (LEVOPHED) Adult infusion Stopped (09/01/21 0141)  ? propofol (DIPRIVAN) infusion 50 mcg/kg/min (09/02/21 0800)  ? ?PRN Meds: ?sodium chloride, acetaminophen (TYLENOL) oral liquid 160 mg/5 mL, docusate, fentaNYL (SUBLIMAZE) injection, fentaNYL (SUBLIMAZE) injection, labetalol, polyethylene glycol  ? ?Vital Signs  ?  ?Vitals:  ? 09/02/21 0600 09/02/21 0700 09/02/21 0744 09/02/21 0800  ?BP:      ?Pulse: 100 (!) 101 98 (!) 102  ?Resp: (!) 33 (!) 36 (!) 31 (!) 27  ?Temp:      ?TempSrc:      ?SpO2: 91% 91% 93% 95%  ?Weight:      ?Height:      ? ? ?Intake/Output Summary (Last 24 hours) at 09/02/2021 0847 ?Last data filed at 09/02/2021 0800 ?Gross per 24 hour  ?Intake 3039.29 ml  ?Output 1919 ml  ?Net 1120.29 ml  ? ? ? ?  09/01/2021  ?  5:00 AM 08/26/2021  ?  10:00 AM 08/25/2021  ?  9:00 AM  ?Last 3 Weights  ?Weight (lbs) 186 lb 4.6 oz 169 lb 15.6 oz 170 lb  ?Weight (kg) 84.5 kg 77.1 kg 77.111 kg  ?   ? ?Telemetry  ?  ?NSR 90-100s- Personally Reviewed ? ?ECG  ?  ?Normal sinus rhythm, rate 84, nonspecific intraventricular conduction delay, LVH- Personally Reviewed ? ?Physical Exam  ? ?GEN: Intubated, sedated ?Neck: No JVD appreciated ?Cardiac: RRR, no murmurs ?Respiratory: Rhonchi ?GI: Soft, nontender ?MS: No edema ?Neuro:  Sedated ?Psych: Unable to assess ? ?Labs  ?  ?High Sensitivity Troponin:   ?Recent Labs  ?Lab 08/19/2021 ?0801 08/29/2021 ?1033 09/02/2021 ?1524  ?TROPONINIHS 95* 797* 3,883*  ? ?   ?Chemistry ?Recent Labs  ?Lab 08/30/2021 ?0801 08/16/2021 ?0223 09/01/21 ?0955 09/01/21 ?1307 09/01/21 ?1644 09/02/21 ?3612 09/02/21 ?0415  ?NA 137   < >  --    < > 135 135 134*  ?K 4.6   < >  --    < > 4.6 4.0 4.1  ?CL 109   < >  --    < > 99 99 98  ?CO2 8*   < >  --    < > 24 25  25  ?GLUCOSE 396*   < >  --    < > 178* 167* 201*  ?BUN 13   < >  --    < > 27* 30* 31*  ?CREATININE 1.26*   < >  --    < > 1.47* 1.58* 1.77*  ?CALCIUM 8.9   < >  --    < > 8.2* 7.9* 7.9*  ?MG  --    < > 1.9  --  2.5*  --  2.4  ?PROT 6.3*  --   --   --   --   --   --   ?ALBUMIN 3.0*  --   --   --   --   --   --   ?AST 46*  --   --   --   --   --   --   ?ALT 18  --   --   --   --   --   --   ?ALKPHOS 79  --   --   --   --   --   --   ?BILITOT 1.0  --   --   --   --   --   --   ?GFRNONAA 46*   < >  --    < > 38* 35* 31*  ?ANIONGAP 20*   < >  --    < > 12 11 11   ? < > = values in this interval not displayed.  ? ?  ?Lipids  ?Recent Labs  ?Lab 09/01/21 ?0335  ?TRIG 46  ? ?  ?Hematology ?Recent Labs  ?Lab 08/29/2021 ?2028 09/01/21 ?2671 09/02/21 ?0415  ?WBC 17.0* 14.9* 14.2*  ?RBC 4.15 3.83* 3.44*  ?HGB 10.4* 9.6* 8.6*  ?HCT 32.0* 29.2* 26.4*  ?MCV 77.1* 76.2* 76.7*  ?MCH 25.1* 25.1* 25.0*  ?MCHC 32.5 32.9 32.6  ?RDW 17.9* 18.2* 18.6*  ?PLT 111* 97* 89*  ? ? ?Thyroid No results for input(s): TSH, FREET4 in the last  168 hours.  ?BNP ?Recent Labs  ?Lab 09/01/21 ?0335  ?BNP 467.7*  ? ?  ?DDimer  ?Recent Labs  ?Lab 08/11/2021 ?0801  ?DDIMER 3.26*  ? ?  ? ?Radiology  ?  ?CT HEAD WO CONTRAST (5MM) ? ?Result Date: 08/09/2021 ?CLINICAL DATA:  Altered mental status EXAM: CT HEAD WITHOUT CONTRAST TECHNIQUE: Contiguous axial images were obtained from the base of the skull through the vertex without intravenous contrast. RADIATION DOSE REDUCTION: This exam was performed according to the departmental dose-optimization program which includes automated exposure control, adjustment of the mA and/or kV according to patient size and/or use of iterative reconstruction technique. COMPARISON:  12/10/2018 FINDINGS: Brain: No acute intracranial findings are seen in noncontrast CT brain. Cortical sulci are prominent. Ventricles are not dilated. There are no epidural or subdural fluid collections. Vascular: Unremarkable. Skull: Unremarkable. Sinuses/Orbits: There is mucosal thickening in the ethmoid sinus. Other: None IMPRESSION: No acute intracranial findings are seen in noncontrast CT brain. Chronic sinusitis. Electronically Signed   By: Elmer Picker M.D.   On: 08/12/2021 12:31  ? ?CT Angio Chest Pulmonary Embolism (PE) W or WO Contrast ? ?Result Date: 08/06/2021 ?CLINICAL DATA:  Syncope EXAM: CT ANGIOGRAPHY CHEST WITH CONTRAST TECHNIQUE: Multidetector CT imaging of the chest was performed using the standard protocol during bolus administration of intravenous contrast. Multiplanar CT image reconstructions and MIPs were obtained to evaluate the vascular anatomy. RADIATION DOSE REDUCTION: This exam was performed according to the departmental dose-optimization program  which includes automated exposure control, adjustment of the mA and/or kV according to patient size and/or use of iterative reconstruction technique. CONTRAST:  173m OMNIPAQUE IOHEXOL 350 MG/ML SOLN COMPARISON:  Chest radiograph done earlier today FINDINGS: Cardiovascular: Contrast  density in the thoracic aorta is less than optimal to evaluate for dissection. Are no intraluminal filling defects in the central pulmonary artery branches. Evaluation of small peripheral pulmonary artery branches is limited by extensive infiltrates in both lungs. There is ectasia of main pulmonary artery measuring 3.4 cm suggesting pulmonary arterial hypertension. There is dilation of left ventricular cavity. Mediastinum/Nodes: Endotracheal tube is noted in place. Enteric tube is noted traversing the thoracic esophagus. No significant lymphadenopathy seen in mediastinum. Lungs/Pleura: Extensive patchy ground-glass densities seen in both lungs. There are patchy alveolar infiltrates in the posterior lower lung fields. Small bilateral pleural effusions are seen. There is no pneumothorax. Upper Abdomen: Tip of enteric tube is seen in the antrum of the stomach. Musculoskeletal: Degenerative changes are noted in the thoracic spine. Review of the MIP images confirms the above findings. IMPRESSION: There is no evidence of central pulmonary artery embolism. Evaluation of small peripheral branches is limited by extensive infiltrates in both lungs. There is ectasia of main pulmonary artery suggesting pulmonary arterial hypertension. Extensive patchy ground-glass densities noted in both lungs. Alveolar densities seen in the posterior lower lung fields. Findings suggest atelectasis/pneumonia and possibly pulmonary edema. Small bilateral pleural effusions are seen. Electronically Signed   By: PElmer PickerM.D.   On: 08/18/2021 12:29  ? ?DG Chest Port 1 View ? ?Result Date: 09/02/2021 ?CLINICAL DATA:  Pneumonia.  Ventilator dependence. EXAM: PORTABLE CHEST 1 VIEW COMPARISON:  09/01/2021 FINDINGS: 0528 hours. Endotracheal tube tip is 3.9 cm above the base of the carina. The NG tube passes into the stomach although the distal tip position is not included on the film. The cardio pericardial silhouette is enlarged. Bilateral  airspace opacity again noted with parahilar predominance and retrocardiac involvement at the left base. Small bilateral pleural effusions. Telemetry leads overlie the chest. IMPRESSION: 1. Persistent bil

## 2021-09-02 NOTE — Consult Note (Signed)
[name] KitchenANTICOAGULATION CONSULT NOTE  ? ?Pharmacy Consult for Heparin dosing ?Indication: ACS ? ?No Known Allergies ? ?Patient Measurements: ?Height: '5\' 4"'$  (162.6 cm) ?Weight: 84.5 kg (186 lb 4.6 oz) ?IBW/kg (Calculated) : 54.7 ?Heparin Dosing Weight: 70.9kg ? ?Vital Signs: ?Temp: 99.9 ?F (37.7 ?C) (03/29 1900) ?Pulse Rate: 83 (03/29 1900) ? ?Labs: ?Recent Labs  ?  08/18/2021 ?0801 08/07/2021 ?8616 08/24/2021 ?1033 08/11/2021 ?1146 08/09/2021 ?1524 08/13/2021 ?1525 08/16/2021 ?2028 08/23/2021 ?2259 09/01/21 ?8372 09/01/21 ?1307 09/02/21 ?9021 09/02/21 ?1155 09/02/21 ?1014 09/02/21 ?1853  ?HGB 11.3*   < >  --    < >  --    < > 10.4*  --  9.6*  --   --  8.6* 9.9*  --   ?HCT 39.7   < >  --    < >  --    < > 32.0*  --  29.2*  --   --  26.4* 29.0*  --   ?PLT 185  --   --   --   --   --  111*  --  97*  --   --  89*  --   --   ?LABPROT 15.1  --   --   --   --   --   --   --   --   --   --   --   --   --   ?INR 1.2  --   --   --   --   --   --   --   --   --   --   --   --   --   ?HEPARINUNFRC  --   --   --   --   --    < >  --   --  0.54  --   --  0.19*  --  0.24*  ?CREATININE 1.26*  --  1.25*  --   --   --   --    < > 1.51*   < > 1.58* 1.77*  --  2.13*  ?TROPONINIHS 95*  --  797*  --  3,883*  --   --   --   --   --   --   --   --   --   ? < > = values in this interval not displayed.  ? ? ? ?Estimated Creatinine Clearance: 26.2 mL/min (A) (by C-G formula based on SCr of 2.13 mg/dL (H)). ? ? ?Medical History: ?Past Medical History:  ?Diagnosis Date  ? Bilateral carotid artery stenosis   ? vascular-- Dagoberto Ligas PA --- last duplex in epic 10-06-2020 right ICA 60-79% and left ICA 40-59%  ? Bladder outlet obstruction   ? self caths  ? Bladder wall thickening   ? Blood transfusion without reported diagnosis   ? Cardiac arrest ([place]) 08/06/2021  ? Cardiogenic shock ([place]) 09/01/2021  ? CHB (complete heart block) ([place]) 08/06/2021  ? Coronary artery disease   ? cardiologist--- dr Gwenlyn Found---- cath 03-25-2020 showed proxRCA 75%, distal RCA 50%, and midLAD  60%,  managed medically  ? Detrusor overactivity   ? urinary bladder  ? Esophageal varices (HCC)   ? followed by dr Henrene Pastor---  nonbleeding  ? Fibroid, uterine   ? Full dentures   ? Hepatic cirrhosis due to chronic hepatitis C infection ([place])   ? followed by [place] NP---  s/p treated chronic hep c with cure and complicated by portal hypertension and  esophageal varies  ? History of CVA (cerebrovascular accident) without residual deficits 06/2016  ? neurologist--- Frann Rider---  hospital admission 06-27-2016 right [place] infart with  left MCA aneurysm,  no residual per pt  ? History of hepatitis C   ? post treatement and cured  ? Hyperlipidemia   ? Hypertension   ? IDA (iron deficiency anemia)   ? 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 left MCA ;  followed by neurologist  ? Peripheral vascular disease ([place])   ? Self-catheterizes urinary bladder   ? Stroke [facility]) 2018  ? Thrombocytopenia ([place]) 12/25/2014  ? Type 2 diabetes mellitus ([place])   ? followed by pcp---- (01-14-2021 pt stated check blood sugar at home twice weekly fasting, did not know average)  ? Vaginal atrophy   ? ?Assessment: ?72 YOF brought in by EMS with acute onset SOB and subsequent cardiac arrest after arrival to ED. ACLS performed ~10 minutes before ROSC. PMH significant for cirrhosis, esophageal varices (no GIB), CVA, CAD, DM, HTN, HLD, PVD, non-ruptured cerebral aneurysm neuroendocrine tumor, and EtOH+cocaine abuse. No anticoagulation prior to admission. Pharmacy consulted for heparin.   ? ?Heparin level is up to 0.24, on 11500 units/hr.  ? ?Goal of Therapy:  ?Heparin level 0.3-0.7 units/ml ?Monitor platelets by anticoagulation protocol: Yes ?  ?Plan:  ?Increase heparin infusion to 1300 units/hr ?Monitor daily heparin level, CBC ?Monitor for signs/symptoms of bleeding  ? ?Hildred Laser, PharmD ?Clinical Pharmacist ?**Pharmacist phone directory can now be found on [email or web address] ([email or web address]).  Listed under [place]. ? ? ? ? ? ?

## 2021-09-02 NOTE — Progress Notes (Signed)
EEG complete - results pending 

## 2021-09-02 NOTE — Procedures (Signed)
Patient Name: Deborah Wise  ?MRN: [id number]  ?Epilepsy Attending: Lora Havens  ?Referring Physician/Provider: Erick Colace, NP ?Date: 09/02/2021 ?Duration: 22.15 mins ? ?Patient history: 71yo F s/p cardiac arrest. EEG to evaluate for seizure.  ? ?Level of alertness:  comatose ? ?AEDs during EEG study: None ? ?Technical aspects: This EEG study was done with scalp electrodes positioned according to the 10-20 International system of electrode placement. Electrical activity was acquired at a sampling rate of '500Hz'$  and reviewed with a high frequency filter of '70Hz'$  and a low frequency filter of '1Hz'$ . EEG data were recorded continuously and digitally stored.  ? ?Description: EEG showed continuous generalized 3 to 6 Hz theta-delta slowing. Hyperventilation and photic stimulation were not performed.    ? ?Of note, study was technically difficult due to significant ventilator artifact. ? ?ABNORMALITY ?- Continuous slow, generalized ? ?IMPRESSION: ?This technically difficult study is suggestive of severe diffuse encephalopathy, nonspecific etiology. No seizures or definite epileptiform discharges were seen throughout the recording. ? ?Lora Havens  ? ?

## 2021-09-02 NOTE — Consult Note (Signed)
[name] KitchenANTICOAGULATION CONSULT NOTE  ? ?Pharmacy Consult for Heparin dosing ?Indication: ACS ? ?No Known Allergies ? ?Patient Measurements: ?Height: '5\' 4"'$  (162.6 cm) ?Weight: 84.5 kg (186 lb 4.6 oz) ?IBW/kg (Calculated) : 54.7 ?Heparin Dosing Weight: 70.9kg ? ?Vital Signs: ?Temp: 99 ?F (37.2 ?C) (03/29 0426) ?Temp Source: Axillary (03/29 0426) ?Pulse Rate: 102 (03/29 0800) ? ?Labs: ?Recent Labs  ?  08/19/2021 ?0801 08/05/2021 ?2671 08/19/2021 ?1033 08/22/2021 ?1146 08/11/2021 ?1524 08/20/2021 ?1525 08/07/2021 ?1827 09/02/2021 ?2028 08/22/2021 ?2259 09/01/21 ?0335 09/01/21 ?1307 09/01/21 ?1644 09/02/21 ?2458 09/02/21 ?0415  ?HGB 11.3*   < >  --    < >  --    < >  --  10.4*  --  9.6*  --   --   --  8.6*  ?HCT 39.7   < >  --    < >  --    < >  --  32.0*  --  29.2*  --   --   --  26.4*  ?PLT 185  --   --   --   --   --   --  111*  --  97*  --   --   --  89*  ?LABPROT 15.1  --   --   --   --   --   --   --   --   --   --   --   --   --   ?INR 1.2  --   --   --   --   --   --   --   --   --   --   --   --   --   ?HEPARINUNFRC  --   --   --   --   --   --  0.53  --   --  0.54  --   --   --  0.19*  ?CREATININE 1.26*  --  1.25*  --   --   --   --   --    < > 1.51*   < > 1.47* 1.58* 1.77*  ?TROPONINIHS 95*  --  797*  --  3,883*  --   --   --   --   --   --   --   --   --   ? < > = values in this interval not displayed.  ? ? ? ?Estimated Creatinine Clearance: 31.5 mL/min (A) (by C-G formula based on SCr of 1.77 mg/dL (H)). ? ? ?Medical History: ?Past Medical History:  ?Diagnosis Date  ? Bilateral carotid artery stenosis   ? vascular-- Dagoberto Ligas PA --- last duplex in epic 10-06-2020 right ICA 60-79% and left ICA 40-59%  ? Bladder outlet obstruction   ? self caths  ? Bladder wall thickening   ? Blood transfusion without reported diagnosis   ? Cardiac arrest ([place]) 08/20/2021  ? Cardiogenic shock ([place]) 08/12/2021  ? CHB (complete heart block) ([place]) 08/17/2021  ? Coronary artery disease   ? cardiologist--- dr Gwenlyn Found---- cath 03-25-2020 showed proxRCA  75%, distal RCA 50%, and midLAD 60%,  managed medically  ? Detrusor overactivity   ? urinary bladder  ? Esophageal varices (HCC)   ? followed by dr Henrene Pastor---  nonbleeding  ? Fibroid, uterine   ? Full dentures   ? Hepatic cirrhosis due to chronic hepatitis C infection ([place])   ? followed by [place] NP---  s/p treated chronic hep c  with cure and complicated by portal hypertension and esophageal varies  ? History of CVA (cerebrovascular accident) without residual deficits 06/2016  ? neurologist--- Frann Rider---  hospital admission 06-27-2016 right [place] infart with  left MCA aneurysm,  no residual per pt  ? History of hepatitis C   ? post treatement and cured  ? Hyperlipidemia   ? Hypertension   ? IDA (iron deficiency anemia)   ? 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 left MCA ;  followed by neurologist  ? Peripheral vascular disease ([place])   ? Self-catheterizes urinary bladder   ? Stroke [facility]) 2018  ? Thrombocytopenia ([place]) 12/25/2014  ? Type 2 diabetes mellitus ([place])   ? followed by pcp---- (01-14-2021 pt stated check blood sugar at home twice weekly fasting, did not know average)  ? Vaginal atrophy   ? ?Assessment: ?32 YOF brought in by EMS with acute onset SOB and subsequent cardiac arrest after arrival to ED. ACLS performed ~10 minutes before ROSC. PMH significant for cirrhosis, esophageal varices (no GIB), CVA, CAD, DM, HTN, HLD, PVD, non-ruptured cerebral aneurysm neuroendocrine tumor, and EtOH+cocaine abuse. No anticoagulation prior to admission. Pharmacy consulted for heparin.   ? ?Heparin level is subtherapeutic at 0.19, on 1000 units/hr. Hgb down slightly to 8.6, plt 89. No s/sx of bleeding or infusion issues.  ? ?Goal of Therapy:  ?Heparin level 0.3-0.7 units/ml ?Monitor platelets by anticoagulation protocol: Yes ?  ?Plan:  ?Increase heparin infusion at 1150 units/hr ?Order heparin level in 6 hours ?Monitor daily heparin level, CBC ?Monitor for signs/symptoms of bleeding  ? ?Antonietta Jewel, PharmD, BCCCP ?Clinical Pharmacist  ?Phone: [phone] ?09/02/2021 8:47 AM ? ?Please check AMION for all [place] phone numbers ?After 10:00 PM, call [place] [phone] ? ? ? ? ?

## 2021-09-02 NOTE — Progress Notes (Signed)
Goals of care discussion: ? ?Patient is clinically declining, has not shown any improvement neurologically. ?Looks like has progressed to ARDS, having difficulty oxygenating requiring high levels of PEEP and FiO2.  Did respond to recruitment maneuver.  Neck step would be proning, however I believe this would be futile if there is indeed anoxic injury. ? ?Plan ?Full DNR ?Continue current level of care ?MRI brain now, if no evidence of anoxic injury we can proceed with proning, if evidence of anoxic injury will discuss possible withdrawal with family ? ?Erick Colace ACNP-BC ?[place] ?Pager # [phone] OR # [phone] if no answer ? ?

## 2021-09-02 NOTE — Progress Notes (Signed)
Patient having issues maintaining sats 88 or greater.CCM notified and NP is at bedside. See EMR for orders received. Monitoring continuously.  ?

## 2021-09-03 ENCOUNTER — Inpatient Hospital Stay (HOSPITAL_COMMUNITY): Payer: Medicare Other

## 2021-09-03 DIAGNOSIS — I469 Cardiac arrest, cause unspecified: Secondary | ICD-10-CM | POA: Diagnosis not present

## 2021-09-03 DIAGNOSIS — G931 Anoxic brain damage, not elsewhere classified: Secondary | ICD-10-CM | POA: Diagnosis not present

## 2021-09-03 LAB — BRAIN NATRIURETIC PEPTIDE: B Natriuretic Peptide: 281.7 pg/mL — ABNORMAL HIGH (ref 0.0–100.0)

## 2021-09-03 LAB — BASIC METABOLIC PANEL WITH GFR
Anion gap: 15 (ref 5–15)
BUN: 49 mg/dL — ABNORMAL HIGH (ref 8–23)
CO2: 22 mmol/L (ref 22–32)
Calcium: 7.2 mg/dL — ABNORMAL LOW (ref 8.9–10.3)
Chloride: 93 mmol/L — ABNORMAL LOW (ref 98–111)
Creatinine, Ser: 3.12 mg/dL — ABNORMAL HIGH (ref 0.44–1.00)
GFR, Estimated: 16 mL/min — ABNORMAL LOW
Glucose, Bld: 233 mg/dL — ABNORMAL HIGH (ref 70–99)
Potassium: 4.8 mmol/L (ref 3.5–5.1)
Sodium: 130 mmol/L — ABNORMAL LOW (ref 135–145)

## 2021-09-03 LAB — BASIC METABOLIC PANEL
Anion gap: 13 (ref 5–15)
BUN: 43 mg/dL — ABNORMAL HIGH (ref 8–23)
CO2: 23 mmol/L (ref 22–32)
Calcium: 7.4 mg/dL — ABNORMAL LOW (ref 8.9–10.3)
Chloride: 98 mmol/L (ref 98–111)
Creatinine, Ser: 2.61 mg/dL — ABNORMAL HIGH (ref 0.44–1.00)
GFR, Estimated: 19 mL/min — ABNORMAL LOW (ref >60)
Glucose, Bld: 210 mg/dL — ABNORMAL HIGH (ref 70–99)
Potassium: 4.4 mmol/L (ref 3.5–5.1)
Sodium: 134 mmol/L — ABNORMAL LOW (ref 135–145)

## 2021-09-03 LAB — CBC
HCT: 23.6 % — ABNORMAL LOW (ref 36.0–46.0)
Hemoglobin: 7.3 g/dL — ABNORMAL LOW (ref 12.0–15.0)
MCH: 24.3 pg — ABNORMAL LOW (ref 26.0–34.0)
MCHC: 30.9 g/dL (ref 30.0–36.0)
MCV: 78.7 fL — ABNORMAL LOW (ref 80.0–100.0)
Platelets: 85 10*3/uL — ABNORMAL LOW (ref 150–400)
RBC: 3 MIL/uL — ABNORMAL LOW (ref 3.87–5.11)
RDW: 18.3 % — ABNORMAL HIGH (ref 11.5–15.5)
WBC: 13.2 10*3/uL — ABNORMAL HIGH (ref 4.0–10.5)
nRBC: 1.4 % — ABNORMAL HIGH (ref 0.0–0.2)

## 2021-09-03 LAB — GLUCOSE, CAPILLARY
Glucose-Capillary: 188 mg/dL — ABNORMAL HIGH (ref 70–99)
Glucose-Capillary: 189 mg/dL — ABNORMAL HIGH (ref 70–99)
Glucose-Capillary: 196 mg/dL — ABNORMAL HIGH (ref 70–99)
Glucose-Capillary: 197 mg/dL — ABNORMAL HIGH (ref 70–99)
Glucose-Capillary: 222 mg/dL — ABNORMAL HIGH (ref 70–99)

## 2021-09-03 LAB — HEPATIC FUNCTION PANEL
ALT: 37 U/L (ref 0–44)
AST: 92 U/L — ABNORMAL HIGH (ref 15–41)
Albumin: 2.3 g/dL — ABNORMAL LOW (ref 3.5–5.0)
Alkaline Phosphatase: 76 U/L (ref 38–126)
Bilirubin, Direct: 2.2 mg/dL — ABNORMAL HIGH (ref 0.0–0.2)
Indirect Bilirubin: 0.9 mg/dL (ref 0.3–0.9)
Total Bilirubin: 3.1 mg/dL — ABNORMAL HIGH (ref 0.3–1.2)
Total Protein: 5.7 g/dL — ABNORMAL LOW (ref 6.5–8.1)

## 2021-09-03 LAB — HEPARIN LEVEL (UNFRACTIONATED): Heparin Unfractionated: 0.31 [IU]/mL (ref 0.30–0.70)

## 2021-09-03 LAB — MRSA NEXT GEN BY PCR, NASAL: MRSA by PCR Next Gen: NOT DETECTED

## 2021-09-03 MED ORDER — PIPERACILLIN-TAZOBACTAM 3.375 G IVPB
3.3750 g | Freq: Three times a day (TID) | INTRAVENOUS | Status: DC
  Administered 2021-09-04: 3.375 g via INTRAVENOUS
  Filled 2021-09-03: qty 50

## 2021-09-03 MED ORDER — SODIUM CHLORIDE 0.9 % IV SOLN
3.0000 g | Freq: Two times a day (BID) | INTRAVENOUS | Status: DC
  Filled 2021-09-03: qty 8

## 2021-09-03 MED ORDER — PIPERACILLIN-TAZOBACTAM 3.375 G IVPB 30 MIN
3.3750 g | Freq: Once | INTRAVENOUS | Status: AC
  Administered 2021-09-03: 3.375 g via INTRAVENOUS
  Filled 2021-09-03: qty 50

## 2021-09-03 MED ORDER — INSULIN ASPART 100 UNIT/ML IJ SOLN: 2.0000 [IU] | INTRAMUSCULAR | Status: DC

## 2021-09-03 MED ORDER — LINEZOLID 600 MG/300ML IV SOLN
600.0000 mg | Freq: Two times a day (BID) | INTRAVENOUS | Status: DC
  Administered 2021-09-03 – 2021-09-04 (×2): 600 mg via INTRAVENOUS
  Filled 2021-09-03 (×3): qty 300

## 2021-09-03 MED ORDER — INSULIN ASPART 100 UNIT/ML IJ SOLN
2.0000 [IU] | INTRAMUSCULAR | Status: DC
  Administered 2021-09-03 – 2021-09-04 (×3): 2 [IU] via SUBCUTANEOUS

## 2021-09-03 MED ORDER — INSULIN GLARGINE-YFGN 100 UNIT/ML ~~LOC~~ SOLN
12.0000 [IU] | Freq: Every day | SUBCUTANEOUS | Status: DC
  Administered 2021-09-03 – 2021-09-04 (×2): 12 [IU] via SUBCUTANEOUS
  Filled 2021-09-03 (×2): qty 0.12

## 2021-09-03 NOTE — Consult Note (Signed)
[name] KitchenANTICOAGULATION CONSULT NOTE  ? ?Pharmacy Consult for Heparin ?Indication: ACS / Afib RVR  ? ?No Known Allergies ? ?Patient Measurements: ?Height: '5\' 4"'$  (162.6 cm) ?Weight: 86.1 kg (189 lb 13.1 oz) ?IBW/kg (Calculated) : 54.7 ?Heparin Dosing Weight: 70.9kg ? ?Vital Signs: ?Temp: 100.4 ?F (38 ?C) (03/30 1105) ?BP: 126/49 (03/30 1105) ?Pulse Rate: 88 (03/30 1105) ? ?Labs: ?Recent Labs  ?  08/30/2021 ?1524 08/15/2021 ?1525 09/01/21 ?6144 09/01/21 ?1307 09/02/21 ?0415 09/02/21 ?1014 09/02/21 ?1853 09/03/21 ?0415  ?HGB  --    < > 9.6*  --  8.6* 9.9*  --  7.3*  ?HCT  --    < > 29.2*  --  26.4* 29.0*  --  23.6*  ?PLT  --    < > 97*  --  89*  --   --  85*  ?HEPARINUNFRC  --    < > 0.54  --  0.19*  --  0.24* 0.31  ?CREATININE  --    < > 1.51*   < > 1.77*  --  2.13* 2.61*  ?TROPONINIHS [phone]*  --   --   --   --   --   --   --   ? < > = values in this interval not displayed.  ? ? ? ?Estimated Creatinine Clearance: 21.6 mL/min (A) (by C-G formula based on SCr of 2.61 mg/dL (H)). ? ? ?Medical History: ?Past Medical History:  ?Diagnosis Date  ? Bilateral carotid artery stenosis   ? vascular-- Dagoberto Ligas PA --- last duplex in epic 10-06-2020 right ICA 60-79% and left ICA 40-59%  ? Bladder outlet obstruction   ? self caths  ? Bladder wall thickening   ? Blood transfusion without reported diagnosis   ? Cardiac arrest ([place]) 08/22/2021  ? Cardiogenic shock ([place]) 08/06/2021  ? CHB (complete heart block) ([place]) 08/06/2021  ? Coronary artery disease   ? cardiologist--- dr Gwenlyn Found---- cath 03-25-2020 showed proxRCA 75%, distal RCA 50%, and midLAD 60%,  managed medically  ? Detrusor overactivity   ? urinary bladder  ? Esophageal varices (HCC)   ? followed by dr Henrene Pastor---  nonbleeding  ? Fibroid, uterine   ? Full dentures   ? Hepatic cirrhosis due to chronic hepatitis C infection ([place])   ? followed by [place] NP---  s/p treated chronic hep c with cure and complicated by portal hypertension and esophageal varies  ? History of  CVA (cerebrovascular accident) without residual deficits 06/2016  ? neurologist--- Frann Rider---  hospital admission 06-27-2016 right [place] infart with  left MCA aneurysm,  no residual per pt  ? History of hepatitis C   ? post treatement and cured  ? Hyperlipidemia   ? Hypertension   ? IDA (iron deficiency anemia)   ? 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 left MCA ;  followed by neurologist  ? Peripheral vascular disease ([place])   ? Self-catheterizes urinary bladder   ? Stroke [facility]) 2018  ? Thrombocytopenia ([place]) 12/25/2014  ? Type 2 diabetes mellitus ([place])   ? followed by pcp---- (01-14-2021 pt stated check blood sugar at home twice weekly fasting, did not know average)  ? Vaginal atrophy   ? ?Assessment: ?60 YOF brought in by EMS with acute onset SOB and subsequent cardiac arrest after arrival to ED. ACLS performed ~10 minutes before ROSC. PMH significant for cirrhosis, esophageal varices (no GIB), CVA, CAD, DM, HTN, HLD, PVD, non-ruptured cerebral aneurysm neuroendocrine tumor, and EtOH+cocaine abuse. No anticoagulation prior to admission.  Pharmacy consulted for heparin.   ? ?Heparin level is up to 0.31 at goal on heparin drip rate 1300 uts/hr. PLTC low stable 80-100 hgb low 7-9 will follow trend closely.  No bleeding noted   ?Currently in SR on IV amiodarone ? ?Goal of Therapy:  ?Heparin level 0.3-0.5 - low end goal given cirrhosis  ?Monitor platelets by anticoagulation protocol: Yes ?  ?Plan:  ?Continue heparin infusion  1300 units/hr ?Monitor daily heparin level, CBC ?Monitor for signs/symptoms of bleeding  ? ? ? ?Bonnita Nasuti Pharm.D. CPP, BCPS ?Clinical Pharmacist ?[phone] ?09/03/2021 12:09 PM  ? ?**Pharmacist phone directory can now be found on [email or web address] ([email or web address]).  Listed under [place]. ? ? ? ? ? ?

## 2021-09-03 NOTE — Consult Note (Signed)
NEUROLOGY CONSULTATION NOTE  ? ?Date of service: September 03, 2021 ?Patient Name: Deborah Wise ?MRN:  [id number] ?DOB:  04/03/1951 ?Reason for consult: prognostication after cardiac arrest ?Requesting physician: Dr. Noemi Chapel ?_ _ _   _ __   _ __ _ _  __ __   _ __   __ _ ? ?History of Present Illness  ? ?This is a 71 yo woman with hx hep C, esophageal varices, CAD, stroke, diabetes, substance abuse who suffered cardiac arrest 2/2 PEA in ED on 3/27. She has had progressive respiratory failure and high vent requirements precluding sedation wean. Hospitalization complicated by ARDS, aspiration PNA, likely cardiogenic pulmonary edema, NSTEMI, a fib with RVR, AKI, and uncontrolled hyperglycemia. EEG 3/29 showed continuous slowing 3-6 Hz no epileptiform abnl. Personal review of MRI brain showed extensive restricted diffusion most prominently in R occipital cortex, bilat hippocampi, bilat cerebellum; also restricted diffusion in bilat caudate and diffusely in cerebral cortex c/w severe anoxic brain injury. Neurology is consulted for assistance with prognostication.  ?  ?ROS  ? ?UTA 2/2 comatose ? ?Past History  ? ?I have reviewed the following: ? ?Past Medical History:  ?Diagnosis Date  ? Bilateral carotid artery stenosis   ? vascular-- Dagoberto Ligas PA --- last duplex in epic 10-06-2020 right ICA 60-79% and left ICA 40-59%  ? Bladder outlet obstruction   ? self caths  ? Bladder wall thickening   ? Blood transfusion without reported diagnosis   ? Cardiac arrest ([place]) 08/19/2021  ? Cardiogenic shock ([place]) 08/10/2021  ? CHB (complete heart block) ([place]) 08/22/2021  ? Coronary artery disease   ? cardiologist--- dr Gwenlyn Found---- cath 03-25-2020 showed proxRCA 75%, distal RCA 50%, and midLAD 60%,  managed medically  ? Detrusor overactivity   ? urinary bladder  ? Esophageal varices (HCC)   ? followed by dr Henrene Pastor---  nonbleeding  ? Fibroid, uterine   ? Full dentures   ? Hepatic cirrhosis due to chronic hepatitis C infection ([place])    ? followed by [place] NP---  s/p treated chronic hep c with cure and complicated by portal hypertension and esophageal varies  ? History of CVA (cerebrovascular accident) without residual deficits 06/2016  ? neurologist--- Frann Rider---  hospital admission 06-27-2016 right [place] infart with  left MCA aneurysm,  no residual per pt  ? History of hepatitis C   ? post treatement and cured  ? Hyperlipidemia   ? Hypertension   ? IDA (iron deficiency anemia)   ? 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 left MCA ;  followed by neurologist  ? Peripheral vascular disease ([place])   ? Self-catheterizes urinary bladder   ? Stroke [facility]) 2018  ? Thrombocytopenia ([place]) 12/25/2014  ? Type 2 diabetes mellitus ([place])   ? followed by pcp---- (01-14-2021 pt stated check blood sugar at home twice weekly fasting, did not know average)  ? Vaginal atrophy   ? ?Past Surgical History:  ?Procedure Laterality Date  ? [place]  ? WITH BILATERAL TUBAL LIGATION  ? COLONOSCOPY  2017  ? PER PT SCHEDULED FOR NEXT ONE 02-02-2021  ? CYSTOSCOPY N/A 01/19/2021  ? Procedure: CYSTOSCOPY;  Surgeon: [name], MD;  [facility];  Service: Gynecology;  Laterality: N/A;  ? ESOPHAGOGASTRODUODENOSCOPY  11/27/2018  ? LAST ONE BY DR Henrene Pastor  ? IR GENERIC HISTORICAL  06/30/2016  ? IR ANGIO VERTEBRAL SEL SUBCLAVIAN INNOMINATE UNI R MOD SED 06/30/2016 [name], MD [facility]  ?  IR GENERIC HISTORICAL  06/30/2016  ? IR ANGIO VERTEBRAL SEL VERTEBRAL UNI L MOD SED 06/30/2016 [name], MD [facility]  ? IR GENERIC HISTORICAL  06/30/2016  ? IR ANGIO INTRA EXTRACRAN SEL COM CAROTID INNOMINATE BILAT MOD SED 06/30/2016 [name], MD [facility]  ? LEFT HEART CATH AND CORONARY ANGIOGRAPHY N/A 03/25/2020  ? Procedure: LEFT HEART CATH AND CORONARY ANGIOGRAPHY;  Surgeon: Jettie Booze, MD;  Location: [facility];  Service: Cardiovascular;  Laterality: N/A;  ?  MULTIPLE EXTRACTIONS WITH ALVEOLOPLASTY  10/25/2011  ? Procedure: MULTIPLE EXTRACION WITH ALVEOLOPLASTY;  Surgeon: Gae Bon, DDS;  Location: [facility];  Service: Oral Surgery;  Laterality: Bilateral;  Extract teeth numbers one, two, six, seven, eight, nine, ten, eleven, twelve, fifteen, sixteen, eighteen, nineteen, twenty-three, twenty-four, twenty-five, twenty-seven, thirty-two and alveoplasty.  ? ORIF ANKLE FRACTURE Right 11/25/2007  ? TOOTH EXTRACTION N/A 10/20/2020  ? Procedure: MULTIPLE DENTAL RESTORATION/EXTRACTIONS, AVELOPLASTY;  Surgeon: Diona Browner, DMD;  Location: [facility];  Service: Oral Surgery;  Laterality: N/A;  ? UPPER GASTROINTESTINAL ENDOSCOPY    ? ?Family History  ?Problem Relation Age of Onset  ? Diabetes type II Mother   ? Hypertension Mother   ? Stroke Mother   ? Cancer Father   ?     patient does not know what type of cancer  ? Diabetes Daughter   ? Breast cancer Neg Hx   ? Colon polyps Neg Hx   ? Crohn's disease Neg Hx   ? Esophageal cancer Neg Hx   ? Rectal cancer Neg Hx   ? Stomach cancer Neg Hx   ? Colon cancer Neg Hx   ? ?Social History  ? ?Socioeconomic History  ? Marital status: Married  ?  Spouse name: Not on file  ? Number of children: 3  ? Years of education: Not on file  ? Highest education level: Not on file  ?Occupational History  ? Occupation: disabled  ?Tobacco Use  ? Smoking status: Never  ? Smokeless tobacco: Never  ?Vaping Use  ? Vaping Use: Never used  ?Substance and Sexual Activity  ? Alcohol use: Not Currently  ?  Alcohol/week: 2.0 standard drinks  ?  Types: 2 Cans of beer per week  ?  Comment: 01-14-2021 per pt on the weekend, two beers  ? Drug use: Not Currently  ?  Types: Cocaine  ?  Comment: 01-14-2021 last use cocaine last year 2018  ? Sexual activity: Yes  ?  Partners: Male  ?  Comment: married- 11 yrs   ?Other Topics Concern  ? Not on file  ?Social History Narrative  ? Not on file  ? ?Social Determinants of Health  ? ?Financial Resource Strain: Not on file  ?Food  Insecurity: Not on file  ?Transportation Needs: Not on file  ?Physical Activity: Not on file  ?Stress: Not on file  ?Social Connections: Not on file  ? ?No Known Allergies ? ?Medications  ? ?Medications Prior to Admission  ?Medication Sig Dispense Refill Last Dose  ? amLODipine (NORVASC) 10 MG tablet TAKE ONE TABLET BY MOUTH EVERY MORNING FOR BLOOD PRESSURE (Patient taking differently: Take 10 mg by mouth daily.) 90 tablet 1 08/29/2021 at 0800  ? ASPIRIN LOW DOSE 81 MG EC tablet TAKE 1 TABLET BY MOUTH EVERY DAY (Patient taking differently: Take 81 mg by mouth daily.) 90 tablet 1 08/29/2021  ? Catheters (SELF-CATH SYSTEM STRAIGHT TIP) KIT 1 each by Does not apply route as needed. (Patient taking differently:  1 each by Does not apply route daily.) 120 kit 11 08/30/2021  ? Cholecalciferol 25 MCG (1000 UT) capsule Take 1,000 Units by mouth daily.    08/29/2021  ? cyanocobalamin 1000 MCG tablet Take 1,000 mcg by mouth in the morning.   08/29/2021  ? docusate sodium (COLACE) 100 MG capsule Take 1 capsule (100 mg total) by mouth 2 (two) times daily. (Patient taking differently: Take 100 mg by mouth 2 (two) times daily as needed.) 60 capsule 11 08/29/2021  ? ferrous sulfate 325 (65 FE) MG EC tablet Take 1 tablet (325 mg total) by mouth in the morning and at bedtime. (Patient taking differently: Take 325 mg by mouth in the morning and at bedtime.)  3 08/29/2021  ? hydrochlorothiazide (HYDRODIURIL) 25 MG tablet TAKE ONE TABLET BY MOUTH EVERY MORNING FOR fluid & FOR BLOOD PRESSURE (Patient taking differently: Take 25 mg by mouth in the morning.) 90 tablet 1 08/29/2021  ? lidocaine (XYLOCAINE) 2 % jelly Apply 1 application topically as needed. (Patient taking differently: Apply 1 application. topically daily.) 30 mL 5 08/30/2021  ? metFORMIN (GLUCOPHAGE) 500 MG tablet TAKE ONE TABLET BY MOUTH EVERY MORNING FOR diabetes (Patient taking differently: Take 500 mg by mouth daily with breakfast.) 180 tablet 1 08/29/2021  ? Multiple  Vitamins-Minerals (HAIR SKIN AND NAILS FORMULA PO) Take 1 tablet by mouth daily.   08/29/2021  ? REPATHA SURECLICK 244 MG/ML SOAJ INJECT 158m into THE SKIN every 14 DAYS FOR cholesterol (Patient taking differently: Inject

## 2021-09-03 NOTE — Progress Notes (Signed)
Inpatient Diabetes Program Recommendations ? ?AACE/ADA: New Consensus Statement on Inpatient Glycemic Control (2015) ? ?Target Ranges:  Prepandial:   less than 140 mg/dL ?     Peak postprandial:   less than 180 mg/dL (1-2 hours) ?     Critically ill patients:  140 - 180 mg/dL  ? ?Lab Results  ?Component Value Date  ? GLUCAP 196 (H) 09/03/2021  ? HGBA1C 6.2 (H) 09/01/2021  ? ? ?Review of Glycemic Control ? Latest Reference Range & Units 09/02/21 20:29 09/03/21 00:32 09/03/21 04:27 09/03/21 08:12  ?Glucose-Capillary 70 - 99 mg/dL 182 (H) 189 (H) 222 (H) 196 (H)  ? ? ?Current orders for Inpatient glycemic control: Semglee 12 units QD, Novolog 2-6 units Q4H ? ?Inpatient Diabetes Program Recommendations:   ? ?Consider adding Novolog 3 units Q4H (for tube feed coverage to be stopped or held in the event tube feeds are stopped).  ? ?Thanks, ?Bronson Curb, MSN, RNC-OB ?Diabetes Coordinator ?[phone] (8a-5p) ? ? ? ?

## 2021-09-03 NOTE — Progress Notes (Signed)
? ?Progress Note ? ?Patient Name: Deborah Wise ?Date of Encounter: 09/03/2021 ? ?[facility] Cardiologist: Candee Furbish, MD  ? ?Subjective  ? ?Intubated and sedated.  On FiO2 100% PEEP 18 this morning.  Febrile to 100.8.  BP 138/48 ? ?Inpatient Medications  ?  ?Scheduled Meds: ? artificial tears  1 application. Both Eyes Q8H  ? aspirin  81 mg Per Tube Daily  ? atorvastatin  80 mg Per Tube Daily  ? chlorhexidine gluconate (MEDLINE KIT)  15 mL Mouth Rinse BID  ? Chlorhexidine Gluconate Cloth  6 each Topical Q0600  ? feeding supplement (PROSource TF)  45 mL Per Tube Daily  ? hydrALAZINE  25 mg Per Tube Q8H  ? insulin aspart  2-6 Units Subcutaneous Q4H  ? insulin glargine-yfgn  10 Units Subcutaneous Daily  ? ipratropium-albuterol  3 mL Nebulization Q4H  ? isosorbide dinitrate  5 mg Per Tube TID  ? mouth rinse  15 mL Mouth Rinse 10 times per day  ? pantoprazole sodium  40 mg Per Tube Daily  ? polyethylene glycol  17 g Per Tube Daily  ? sennosides  5 mL Per Tube BID  ? ?Continuous Infusions: ? sodium chloride    ? amiodarone 30 mg/hr (09/03/21 0600)  ? ampicillin-sulbactam (UNASYN) IV Stopped (09/03/21 0335)  ? feeding supplement (VITAL AF 1.2 CAL) 1,000 mL (09/02/21 1822)  ? fentaNYL infusion INTRAVENOUS Stopped (09/02/21 1649)  ? heparin 1,300 Units/hr (09/03/21 5621)  ? norepinephrine (LEVOPHED) Adult infusion Stopped (09/01/21 0141)  ? propofol (DIPRIVAN) infusion 65 mcg/kg/min (09/03/21 3086)  ? ?PRN Meds: ?sodium chloride, acetaminophen (TYLENOL) oral liquid 160 mg/5 mL, docusate, fentaNYL, fentaNYL (SUBLIMAZE) injection, fentaNYL (SUBLIMAZE) injection, labetalol, polyethylene glycol, vecuronium  ? ?Vital Signs  ?  ?Vitals:  ? 09/03/21 0239 09/03/21 0500 09/03/21 0600 09/03/21 0745  ?BP:    (!) 138/48  ?Pulse:  91 90 89  ?Resp:  14 (!) 25 (!) 28  ?Temp:  (!) 100.4 ?F (38 ?C) (!) 100.4 ?F (38 ?C) 100 ?F (37.8 ?C)  ?TempSrc:      ?SpO2: 100% 100% 99% 100%  ?Weight:  86.1 kg    ?Height:      ? ? ?Intake/Output  Summary (Last 24 hours) at 09/03/2021 0823 ?Last data filed at 09/03/2021 0800 ?Gross per 24 hour  ?Intake 3676.27 ml  ?Output 795 ml  ?Net 2881.27 ml  ? ? ? ?  09/03/2021  ?  5:00 AM 09/01/2021  ?  5:00 AM 08/18/2021  ? 10:00 AM  ?Last 3 Weights  ?Weight (lbs) 189 lb 13.1 oz 186 lb 4.6 oz 169 lb 15.6 oz  ?Weight (kg) 86.1 kg 84.5 kg 77.1 kg  ?   ? ?Telemetry  ?  ?NSR 80-90s- Personally Reviewed ? ?ECG  ?  ?No new ECG- Personally Reviewed ? ?Physical Exam  ? ?GEN: Intubated, sedated ?Cardiac: RRR, no murmurs ?Respiratory: Rhonchi ?GI: Soft, nontender ?MS: No edema ?Neuro:  Sedated ?Psych: Unable to assess ? ?Labs  ?  ?High Sensitivity Troponin:   ?Recent Labs  ?Lab 09/03/2021 ?0801 08/17/2021 ?1033 09/01/2021 ?1524  ?TROPONINIHS 95* 797* 3,883*  ? ?   ?Chemistry ?Recent Labs  ?Lab 08/27/2021 ?0801 08/26/2021 ?0904 09/01/21 ?1644 09/02/21 ?5784 09/02/21 ?6962 09/02/21 ?1014 09/02/21 ?1853 09/03/21 ?0415  ?NA 137   < > 135   < > 134* 134* 135 134*  ?K 4.6   < > 4.6   < > 4.1 4.2 4.1 4.4  ?CL 109   < > 99   < >  98  --  98 98  ?CO2 8*   < > 24   < > 25  --  24 23  ?GLUCOSE 396*   < > 178*   < > 201*  --  174* 210*  ?BUN 13   < > 27*   < > 31*  --  38* 43*  ?CREATININE 1.26*   < > 1.47*   < > 1.77*  --  2.13* 2.61*  ?CALCIUM 8.9   < > 8.2*   < > 7.9*  --  7.6* 7.4*  ?MG  --    < > 2.5*  --  2.4  --  2.4  --   ?PROT 6.3*  --   --   --   --   --   --  5.7*  ?ALBUMIN 3.0*  --   --   --   --   --   --  2.3*  ?AST 46*  --   --   --   --   --   --  92*  ?ALT 18  --   --   --   --   --   --  37  ?ALKPHOS 79  --   --   --   --   --   --  76  ?BILITOT 1.0  --   --   --   --   --   --  3.1*  ?GFRNONAA 46*   < > 38*   < > 31*  --  25* 19*  ?ANIONGAP 20*   < > 12   < > 11  --  13 13  ? < > = values in this interval not displayed.  ? ?  ?Lipids  ?Recent Labs  ?Lab 09/01/21 ?0335  ?TRIG 46  ? ?  ?Hematology ?Recent Labs  ?Lab 09/01/21 ?6659 09/02/21 ?0415 09/02/21 ?1014 09/03/21 ?0415  ?WBC 14.9* 14.2*  --  13.2*  ?RBC 3.83* 3.44*  --  3.00*  ?HGB  9.6* 8.6* 9.9* 7.3*  ?HCT 29.2* 26.4* 29.0* 23.6*  ?MCV 76.2* 76.7*  --  78.7*  ?MCH 25.1* 25.0*  --  24.3*  ?MCHC 32.9 32.6  --  30.9  ?RDW 18.2* 18.6*  --  18.3*  ?PLT 97* 89*  --  85*  ? ? ?Thyroid No results for input(s): TSH, FREET4 in the last 168 hours.  ?BNP ?Recent Labs  ?Lab 09/01/21 ?9357 09/02/21 ?0177 09/03/21 ?0415  ?BNP 467.7* 404.0* 281.7*  ? ?  ?DDimer  ?Recent Labs  ?Lab 08/21/2021 ?0801  ?DDIMER 3.26*  ? ?  ? ?Radiology  ?  ?MR BRAIN WO CONTRAST ? ?Result Date: 09/02/2021 ?CLINICAL DATA:  Mental status change EXAM: MRI HEAD WITHOUT CONTRAST TECHNIQUE: Multiplanar, multiecho pulse sequences of the brain and surrounding structures were obtained without intravenous contrast. COMPARISON:  06/28/2016. FINDINGS: Brain: Restricted diffusion with ADC correlates in the right-greater-than-left occipital lobe cortex (series 2, images 16-18, 20-24), bilateral hippocampi (series 2, images 19-22), and in the bilateral cerebellar hemispheres (series 2, image 11 and 16). Additional punctate focus of restricted diffusion in the superior right parietal lobe (series 2, image 37). Increased signal on diffusion-weighted imaging in the bilateral caudate (series 2, image 30) and somewhat diffusely within the cortex, posterior more than anterior. Many of these areas are also hyperintense on FLAIR, likely reflecting cytotoxic edema. No acute hemorrhage, mass, mass effect, or midline shift. Lacunar infarct in the left pons. T2 hyperintense signal in the periventricular  white matter, likely the sequela of chronic small vessel ischemic disease. No hydrocephalus or extra-axial collection. Vascular: Normal flow voids. Skull and upper cervical spine: Normal marrow signal. Sinuses/Orbits: Mucosal thickening in the ethmoid air cells. Other: Trace fluid in the mastoid air cells. IMPRESSION: 1. Restricted diffusion, most prominently in the right occipital cortex, bilateral hippocampi, and bilateral cerebellar hemispheres, but also  more subtle restricted diffusion in the bilateral caudate and diffusely within the cerebral cortex, overall concerning for hypoxic ischemic injury. 2. Punctate focus of restricted diffusion in the right parietal lobe subcortical white matter may be embolic or could also be related to anoxic injury. Electronically Signed   By: Merilyn Baba M.D.   On: 09/02/2021 18:10  ? ?DG Chest Port 1 View ? ?Result Date: 09/03/2021 ?CLINICAL DATA:  72 year old female with atrial fibrillation, RVR. Pulmonary edema. EXAM: PORTABLE CHEST 1 VIEW COMPARISON:  09/02/2021 and earlier. FINDINGS: Portable AP semi upright view at 0538 hours. Endotracheal tube tip remains in good position between the clavicles and carina. Enteric tube courses to the abdomen, tip not included. Stable lung volumes and mediastinal contours with up to mild cardiomegaly. Regressed but not resolved confluent perihilar opacity and left lung base veiling opacity. No pneumothorax. No air bronchograms. No areas of worsening ventilation. Stable visualized osseous structures. IMPRESSION: 1. Stable lines and tubes. 2. Improved ventilation with regressed but not resolved perihilar probable pulmonary edema and left pleural effusion. 3. No new cardiopulmonary abnormality. Electronically Signed   By: Genevie Ann M.D.   On: 09/03/2021 05:55  ? ?DG Chest Port 1 View ? ?Result Date: 09/02/2021 ?CLINICAL DATA:  Pneumonia.  Ventilator dependence. EXAM: PORTABLE CHEST 1 VIEW COMPARISON:  09/01/2021 FINDINGS: 0528 hours. Endotracheal tube tip is 3.9 cm above the base of the carina. The NG tube passes into the stomach although the distal tip position is not included on the film. The cardio pericardial silhouette is enlarged. Bilateral airspace opacity again noted with parahilar predominance and retrocardiac involvement at the left base. Small bilateral pleural effusions. Telemetry leads overlie the chest. IMPRESSION: 1. Persistent bilateral parahilar and retrocardiac airspace disease  compatible with edema or multifocal pneumonia. 2. Persistent bilateral pleural effusions. Electronically Signed   By: Misty Stanley M.D.   On: 09/02/2021 05:59  ? ?DG Chest Port 1 View ? ?Result Date: 3/28/2

## 2021-09-03 NOTE — Progress Notes (Signed)
? ?NAME:  Deborah Wise, MRN:  [id number], DOB:  1951/03/11, LOS: 3 ?ADMISSION DATE:  08/06/2021, CONSULTATION DATE:  3/27 ?REFERRING MD:  Alvino Chapel, CHIEF COMPLAINT:  cardiac arrest   ? ?History of Present Illness:  ?71 year old female patient with extensive history as outlined below.  Presented to the emergency room on 3/27 status post cardiac arrest.  Per report he was eating breakfast the morning of 3/27, developed rather acute onset of shortness of breath for which EMS was summoned.  On fire department arrival pulse oximetry in the 30s and only responding to painful stimulus.  Initially attempted CPAP but patient ripped it off.  He was given magnesium and Solu-Medrol by EMS given prior history of anaphylaxis.  Upon arrival to the emergency room was bradycardic and subsequently went into cardiac arrest ACLS algorithm initiated she was intubated, received epinephrine and atropine, initially cardiac rhythm appeared to be third-degree heart block.  Post resuscitation patient was with narrow complex regular tachycardia, with mild ST elevation in inferior leads. PCCM asked to admit. Cardiology at bedside. ? 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 ?Significant Hospital Events: ?Including procedures, antibiotic start and stop dates in addition to other pertinent events   ?3/27 Admit.  Hypoxic on arrival and 30s.  Bradycardic and unresponsive, third-degree heart block and cardiac arrest on arrival to ER.  Time to return of spontaneous circulation estimated at 10 minutes.  Possible inferior wall ST elevation.  Intubated.  Right femoral  central line placed, right radial A-line placed. CT head negative for acute findings.  CT angiogram of chest negative for pulmonary emboli findings consistent with pulmonary artery hypertension there was extensive patchy groundglass densities in both lungs.  She was started on IV Unasyn. ECHO w EF 35-40% left ventricle demonstrates regional wall motion abnormality left atrial size mildly dilated mild mitral valve regurg IVC dilated ?3/28 Weaned off norepinephrine.  Acid-base improved, bicarbonate infusion stopped.  Mental status still quite depressed, only able to elicit cough when suctioning otherwise no response ?3/29 Pulmonary edema on chest x-ray getting diuresis, needing 100% FIO2 and increased PEEP support. + NMB renal function slightly worse.  Mental status not improved. Added IMDUR and hydralazine.  EEG ordered. Made DNR. MRI with restricted diffusion in right occipital cortex, bilateral hippocampi, bilateral cerebellar hemispheres, concerning for hypoxic ischemic injury.  ? ?Interim History / Subjective:  ?Tmax 100.4  ?Blood cultures pending  ?Vent - PEEP 18, 100%  ?Glucose range 189-222 ?I/O 835 UOP, +2.9L  ?Family at bedside  ? ?Objective   ?Blood pressure (!) 116/48, pulse 90, temperature (!) 100.4 ?F (38 ?C), resp. rate (!) 25, height '5\' 4"'$  (1.626 m), weight 86.1 kg, SpO2 99 %. ?   ?Vent Mode: PRVC ?FiO2 (%):  [100 %] 100 % ?Set Rate:  [22 bmp] 22 bmp ?Vt Set:  [430 mL] 430 mL ?PEEP:  [12 cmH20-18 cmH20] 18 cmH20 ?Plateau Pressure:  [21 JHE17-40 cmH20] 26 cmH20  ? ?Intake/Output Summary (Last 24 hours) at 09/03/2021 0725 ?Last data filed at 09/03/2021 0600 ?Gross per 24 hour  ?Intake 3775.39 ml  ?Output 835 ml  ?Net 2940.39 ml  ? ?Filed Weights  ? 08/28/2021  1000 09/01/21 0500 09/03/21 0500  ?Weight: 77.1 kg 84.5 kg 86.1 kg  ? ? ?Examination: ?General: critically ill appearing adult female lying in bed in NAD on vent   ?HEENT: MM pink/moist, ETT, anicteric, pupils 45m slugghish ?Neuro: sedate on propofol &  fentanyl. No cough/gag, no response to pain.  ?CV: s1s2 RRR, no m/r/g ?PULM: non-labored at rest on vent, lungs bilaterally clear with good air entry, diminished left base ?GI: soft, bsx4 active  ?Extremities: warm/dry, 1+ generalized edema  ?Skin: no rashes or lesions ? ?Resolved Hospital Problem list   ?Cardiogenic shock resolved ?Metabolic acidosis ?Lactic acidosis  ? ?Assessment & Plan:  ? ?Cardiac Arrest, seemingly more likely 2/2 ACS  ?CT chest neg for PE. Conduction delay, LVH on EKG.  Troponin 3,883 ?-ischemic evaluation pending neuro recovery  ?-tele monitoring  ?-heparin infusion  ? ?AF w/ RVR.  ?Hx CAD, HTN, PVD ?Had brief CHB. Rhythm has been rapid and irregular since ROSC.  Hx 75% proximal RCA stenosis ?-rate control ?-amiodarone infusion  ?-continue heparin ? ?Acute Systolic and Diastolic Heart Failure (new dx) EF 34-40% ?-tele monitoring  ?-appreciate Cardiology  ?-continue imdur, hydralazine  ? ?Acute Hypoxic Respiratory Failure w/ ARDS and Pulmonary Edema c/b Aspiration PNA ?-PAD protocol for RASS goal -2 ?-PRVC 8cc/kg as rest mode ?-wean PEEP / fiO2 for sats >90% ?-D4 unasyn  ?-VAP protocol  ?-follow intermittent ABG, CXR  ?-goal Peak/Plateau pressures <30 ? 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 Initial CT head negative.  MRI worrisome for anoxic injury.  ?-supportive care  ?-avoid fever, hypo/hyperglyemia, hyper/hypotension  ?-wean sedation as able  ?-MRI brain worrisome for anoxic injury, Neurology consulted, appreciate evaluation  ? ?AKI ?Normal renal function in 06/2021.   ?-Trend BMP / urinary output ?-Replace electrolytes as indicated ?-Avoid nephrotoxic agents, ensure adequate renal perfusion ?-hold further diuresis  ? ?Fluid and electrolyte balance: hyponatremia ?-monitor electrolytes, replace as indicated   ? ?DM with Hyperglycemia ?-hold home metformin  ?-SSI ?-increase semglee to 12 units QD ? ?History  of Cirrhosis, felt secondary to Hep C+ ?-follow intermittent LFT's ?-universal precautions  ? ?Best Practice (right click and "Reselect all SmartList Selections" daily)  ?Diet/type: tubefeeds ?DVT prophylaxis: systemic heparin ?GI prophylaxis: PPI ?Lines: Central line ?Foley:  Yes, and it is still needed ?Code Status:  full code ?Last date of multidisciplinary goals of care discussion: Two daughters, son and pastor updated at bedside 3/30.  Reviewed MRI findings, concern for anoxic injury and potential for recovery.  Discussed neuro evaluation pending.  Support offered.   ?  ? ?Critical Care Time: 36 minutes   ? ?BNoe Gens MSN, APRN, NP-C, AGACNP-BC ?[facility] ?09/03/2021, 7:25 AM ? ? ?Please see [email or web address] for pager details.  ? ?From 7A-7P if no response, please call [phone] ?After hours, please call EWarren [phone]? ? ? ? ? ? ?

## 2021-09-03 NOTE — Plan of Care (Signed)
?  Problem: Clinical Measurements: ?Goal: Cardiovascular complication will be avoided ?Outcome: Progressing ?  ?Problem: Nutrition: ?Goal: Adequate nutrition will be maintained ?Outcome: Progressing ?  ?Problem: Elimination: ?Goal: Will not experience complications related to urinary retention ?Outcome: Progressing ?  ?Problem: Clinical Measurements: ?Goal: Respiratory complications will improve ?Outcome: Not Progressing ?  ?Problem: Activity: ?Goal: Risk for activity intolerance will decrease ?Outcome: Not Progressing ?  ?

## 2021-09-03 NOTE — Progress Notes (Addendum)
Pharmacy Antibiotic Note ? ?KREE Wise is a 71 y.o. female presented on 08/25/2021 with SOB to ED and subsequent cardiac  arrest in ED.  She is on unasyn and plans are to change antibiotics to zosyn and Zyvox  ?-WBC= 13.2, tmax- 100.8 ?-SCr 2.1> 2.6, CrCl ~ 20-25 ? ?Plan: ?Zosyn 3.375g, IV q8h ?Monitor renal function, cultures, and clinical progression  ? ?Height: '5\' 4"'$  (162.6 cm) ?Weight: 86.1 kg (189 lb 13.1 oz) ?IBW/kg (Calculated) : 54.7 ? ?Temp (24hrs), Avg:100.3 ?F (37.9 ?C), Min:99.3 ?F (37.4 ?C), Max:100.8 ?F (38.2 ?C) ? ?Recent Labs  ?Lab 09/03/2021 ?0748 08/08/2021 ?0800 08/16/2021 ?0801 08/11/2021 ?1033 08/20/2021 ?2028 08/08/2021 ?2259 09/01/21 ?0335 09/01/21 ?0955 09/01/21 ?1307 09/01/21 ?1644 09/01/21 ?1959 09/02/21 ?1017 09/02/21 ?5102 09/02/21 ?1853 09/03/21 ?5852  ?WBC  --   --  8.0  --  17.0*  --  14.9*  --   --   --   --   --  14.2*  --  13.2*  ?CREATININE  --    < > 1.26* 1.25*  --    < > 1.51*  --    < > 1.47*  --  1.58* 1.77* 2.13* 2.61*  ?LATICACIDVEN >9.0*  --   --  4.9*  --   --   --  3.1*  --   --  2.9*  --   --   --   --   ? < > = values in this interval not displayed.  ? ?  ?Estimated Creatinine Clearance: 21.6 mL/min (A) (by C-G formula based on SCr of 2.61 mg/dL (H)).   ? ?No Known Allergies ? ?Antimicrobials this admission: ?Unasyn 3/27>> 3/30 ?Zosyn 3/30>> ?Zyvox 3/30>> ? ?Dose adjustments this admission: ? ? ?Microbiology results: ?3/27 Tracheal aspirate cx- normal flora ?3/27 BCx x2- ngtd ?3/27 MRSA PCR: neg  ? ?Thank you for allowing pharmacy to be a part of this patient?s care. ? ?Hildred Laser, PharmD ?Clinical Pharmacist ?**Pharmacist phone directory can now be found on [email or web address] ([email or web address]).  Listed under [place]. ? ? ?

## 2021-09-04 ENCOUNTER — Inpatient Hospital Stay (HOSPITAL_COMMUNITY): Payer: Medicare Other

## 2021-09-04 DIAGNOSIS — I5041 Acute combined systolic (congestive) and diastolic (congestive) heart failure: Secondary | ICD-10-CM | POA: Diagnosis not present

## 2021-09-04 DIAGNOSIS — G9341 Metabolic encephalopathy: Secondary | ICD-10-CM | POA: Diagnosis not present

## 2021-09-04 DIAGNOSIS — I469 Cardiac arrest, cause unspecified: Secondary | ICD-10-CM | POA: Diagnosis not present

## 2021-09-04 DIAGNOSIS — N179 Acute kidney failure, unspecified: Secondary | ICD-10-CM | POA: Diagnosis not present

## 2021-09-04 DIAGNOSIS — I480 Paroxysmal atrial fibrillation: Secondary | ICD-10-CM

## 2021-09-04 LAB — POCT I-STAT 7, (LYTES, BLD GAS, ICA,H+H)
Acid-base deficit: 2 mmol/L (ref 0.0–2.0)
Bicarbonate: 22.9 mmol/L (ref 20.0–28.0)
Calcium, Ion: 0.89 mmol/L — CL (ref 1.15–1.40)
HCT: 22 % — ABNORMAL LOW (ref 36.0–46.0)
Hemoglobin: 7.5 g/dL — ABNORMAL LOW (ref 12.0–15.0)
O2 Saturation: 99 %
Patient temperature: 36.8
Potassium: 4.9 mmol/L (ref 3.5–5.1)
Sodium: 126 mmol/L — ABNORMAL LOW (ref 135–145)
TCO2: 24 mmol/L (ref 22–32)
pCO2 arterial: 38.1 mmHg (ref 32–48)
pH, Arterial: 7.385 (ref 7.35–7.45)
pO2, Arterial: 125 mmHg — ABNORMAL HIGH (ref 83–108)

## 2021-09-04 LAB — CBC
HCT: 21.3 % — ABNORMAL LOW (ref 36.0–46.0)
Hemoglobin: 7 g/dL — ABNORMAL LOW (ref 12.0–15.0)
MCH: 25.5 pg — ABNORMAL LOW (ref 26.0–34.0)
MCHC: 32.9 g/dL (ref 30.0–36.0)
MCV: 77.5 fL — ABNORMAL LOW (ref 80.0–100.0)
Platelets: 95 10*3/uL — ABNORMAL LOW (ref 150–400)
RBC: 2.75 MIL/uL — ABNORMAL LOW (ref 3.87–5.11)
RDW: 18.2 % — ABNORMAL HIGH (ref 11.5–15.5)
WBC: 12 10*3/uL — ABNORMAL HIGH (ref 4.0–10.5)
nRBC: 1.6 % — ABNORMAL HIGH (ref 0.0–0.2)

## 2021-09-04 LAB — BASIC METABOLIC PANEL
Anion gap: 16 — ABNORMAL HIGH (ref 5–15)
BUN: 53 mg/dL — ABNORMAL HIGH (ref 8–23)
CO2: 21 mmol/L — ABNORMAL LOW (ref 22–32)
Calcium: 7 mg/dL — ABNORMAL LOW (ref 8.9–10.3)
Chloride: 91 mmol/L — ABNORMAL LOW (ref 98–111)
Creatinine, Ser: 3.49 mg/dL — ABNORMAL HIGH (ref 0.44–1.00)
GFR, Estimated: 14 mL/min — ABNORMAL LOW (ref >60)
Glucose, Bld: 214 mg/dL — ABNORMAL HIGH (ref 70–99)
Potassium: 5 mmol/L (ref 3.5–5.1)
Sodium: 128 mmol/L — ABNORMAL LOW (ref 135–145)

## 2021-09-04 LAB — HEPARIN LEVEL (UNFRACTIONATED): Heparin Unfractionated: 0.44 IU/mL (ref 0.30–0.70)

## 2021-09-04 LAB — TRIGLYCERIDES: Triglycerides: 76 mg/dL (ref <150)

## 2021-09-04 LAB — BRAIN NATRIURETIC PEPTIDE: B Natriuretic Peptide: 291.2 pg/mL — ABNORMAL HIGH (ref 0.0–100.0)

## 2021-09-04 LAB — GLUCOSE, CAPILLARY
Glucose-Capillary: 199 mg/dL — ABNORMAL HIGH (ref 70–99)
Glucose-Capillary: 156 mg/dL — ABNORMAL HIGH (ref 70–99)
Glucose-Capillary: 220 mg/dL — ABNORMAL HIGH (ref 70–99)
Glucose-Capillary: 196 mg/dL — ABNORMAL HIGH (ref 70–99)
Glucose-Capillary: 174 mg/dL — ABNORMAL HIGH (ref 70–99)

## 2021-09-04 MED ORDER — POLYVINYL ALCOHOL 1.4 % OP SOLN
1.0000 [drp] | Freq: Four times a day (QID) | OPHTHALMIC | Status: DC | PRN
  Filled 2021-09-04: qty 15

## 2021-09-04 MED ORDER — LORAZEPAM 2 MG/ML IJ SOLN
2.0000 mg | INTRAMUSCULAR | Status: DC | PRN
  Administered 2021-09-04 (×2): 2 mg via INTRAVENOUS
  Filled 2021-09-04: qty 2

## 2021-09-04 MED ORDER — GLYCOPYRROLATE 0.2 MG/ML IJ SOLN
0.2000 mg | INTRAMUSCULAR | Status: DC | PRN
Start: 2021-09-04 — End: 2021-09-05

## 2021-09-04 MED ORDER — FENTANYL 2500MCG IN NS 250ML (10MCG/ML) PREMIX INFUSION
0.0000 ug/h | INTRAVENOUS | Status: DC
  Administered 2021-09-04: 100 ug/h via INTRAVENOUS

## 2021-09-04 MED ORDER — ACETAMINOPHEN 325 MG PO TABS: 650.0000 mg | ORAL_TABLET | Freq: Four times a day (QID) | ORAL | Status: DC | PRN

## 2021-09-04 MED ORDER — FENTANYL CITRATE PF 50 MCG/ML IJ SOSY: 50.0000 ug | PREFILLED_SYRINGE | INTRAMUSCULAR | Status: DC | PRN

## 2021-09-04 MED ORDER — MORPHINE 100MG IN NS 100ML (1MG/ML) PREMIX INFUSION: 0.0000 mg/h | INTRAVENOUS | Status: DC

## 2021-09-04 MED ORDER — DEXTROSE 5 % IV SOLN: INTRAVENOUS | Status: DC

## 2021-09-04 MED ORDER — PIPERACILLIN-TAZOBACTAM IN DEX 2-0.25 GM/50ML IV SOLN
2.2500 g | Freq: Four times a day (QID) | INTRAVENOUS | Status: DC
  Filled 2021-09-04 (×2): qty 50

## 2021-09-04 MED ORDER — DEXTROSE 10 % IV SOLN: INTRAVENOUS | Status: DC

## 2021-09-04 MED ORDER — GLYCOPYRROLATE 0.2 MG/ML IJ SOLN: 0.2000 mg | INTRAMUSCULAR | Status: DC | PRN

## 2021-09-04 MED ORDER — ACETAMINOPHEN 650 MG RE SUPP: 650.0000 mg | Freq: Four times a day (QID) | RECTAL | Status: DC | PRN

## 2021-09-04 MED ORDER — DIPHENHYDRAMINE HCL 50 MG/ML IJ SOLN: 25.0000 mg | INTRAMUSCULAR | Status: DC | PRN

## 2021-09-04 MED ORDER — MORPHINE BOLUS VIA INFUSION: 5.0000 mg | INTRAVENOUS | Status: DC | PRN

## 2021-09-04 MED ORDER — GLYCOPYRROLATE 1 MG PO TABS
1.0000 mg | ORAL_TABLET | ORAL | Status: DC | PRN
  Filled 2021-09-04: qty 1

## 2021-09-04 MED ORDER — CALCIUM GLUCONATE-NACL 2-0.675 GM/100ML-% IV SOLN
2.0000 g | Freq: Once | INTRAVENOUS | Status: AC
  Administered 2021-09-04: 2000 mg via INTRAVENOUS
  Filled 2021-09-04: qty 100

## 2021-09-04 MED ORDER — INSULIN ASPART 100 UNIT/ML IJ SOLN
3.0000 [IU] | INTRAMUSCULAR | Status: DC
  Administered 2021-09-04: 3 [IU] via SUBCUTANEOUS

## 2021-09-04 MED ORDER — MORPHINE SULFATE (PF) 2 MG/ML IV SOLN: 2.0000 mg | INTRAVENOUS | Status: DC | PRN

## 2021-09-04 MED ORDER — FENTANYL BOLUS VIA INFUSION
100.0000 ug | INTRAVENOUS | Status: DC | PRN
  Filled 2021-09-04: qty 100

## 2021-09-05 LAB — CULTURE, BLOOD (ROUTINE X 2)
Culture: NO GROWTH
Culture: NO GROWTH
Special Requests: ADEQUATE

## 2021-09-05 NOTE — Death Summary Note (Signed)
?DEATH SUMMARY  ? ?Patient Details  ?Name: Deborah Wise ?MRN: [id number] ?DOB: [birth year] ? ?Admission/Discharge Information  ? ?Admit Date:  09/07/2021  ?Date of Death: Date of Death: 09-11-21  ?Time of Death: Time of Death: 0962  ?Length of Stay: 4  ?Referring Physician: Isaac Bliss, Rayford Halsted, MD  ? ?Reason(s) for Hospitalization  ?Cardiac Arrest ? ?Diagnoses  ?Preliminary cause of death:  ?Cardiac Arrest ? ?Secondary Diagnoses (including complications and co-morbidities):  ?Active Problems: ?  Atrial fibrillation with RVR ([place]) ?  History of diastolic dysfunction ?  Acute respiratory failure ([place]) ?  Acute metabolic encephalopathy ?  Metabolic acidosis ?  AKI (acute kidney injury) ([place]) ?  Alteration in electrolyte and fluid balance ?  Hepatic cirrhosis due to chronic hepatitis C infection ([place]) ?  Bilateral carotid artery stenosis ?  Esophageal varices ([facility]) ?  History of CVA (cerebrovascular accident) without residual deficits ?  IDA (iron deficiency anemia) ?  Type 2 diabetes mellitus ([place]) ?  Hyperlipidemia ?  Lactic acidosis ?  Acute combined systolic and diastolic heart failure ([place]) ?  MI (myocardial infarction) ([place]) ?  ARDS (adult respiratory distress syndrome) ([place]) ?  DNR (do not resuscitate) ?  PAF (paroxysmal atrial fibrillation) ([place]) ? ? ?Brief Hospital Course (including significant findings, care, treatment, and services provided and events leading to death)  ?Deborah Wise is a 71 y.o. year old female with cirrhosis, chronic diastolic heart failure, coronary artery disease, hypertension and CVA who was admitted after suffering out of hospital cardiac arrest with a downtime of 10 minutes.  She was intubated for acute hypoxemic respiratory failure with ARDS.  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  Family discussions were held about patient's prognosis and given her significant cardiac, neurologic and renal dysfunction it was decided to proceed forward with compassionate  extubation and comfort care.  The patient passed away on 09-11-2021 at 1339. ? ? ? ?Pertinent Labs and Studies  ?Significant Diagnostic Studies ?CT HEAD WO CONTRAST (5MM) ? ?Result Date: September 07, 2021 ?CLINICAL DATA:  Altered mental status EXAM: CT HEAD WITHOUT CONTRAST TECHNIQUE: Contiguous axial images were obtained from the base of the skull through the vertex without intravenous contrast. RADIATION DOSE REDUCTION: This exam was performed according to the departmental dose-optimization program which includes automated exposure control, adjustment of the mA and/or kV according to patient size and/or use of iterative reconstruction technique. COMPARISON:  12/10/2018 FINDINGS: Brain: No acute intracranial findings are seen in noncontrast CT brain. Cortical sulci are prominent. Ventricles are not dilated. There are no epidural or subdural fluid collections. Vascular: Unremarkable. Skull: Unremarkable. Sinuses/Orbits: There is mucosal thickening in the ethmoid sinus. Other: None IMPRESSION: No acute intracranial findings are seen in noncontrast CT brain. Chronic sinusitis. Electronically Signed   By: Elmer Picker M.D.   On: 09-07-2021 12:31  ? ?CT Angio Chest Pulmonary Embolism (PE) W or WO Contrast ? ?Result Date: 09-07-21 ?CLINICAL DATA:  Syncope EXAM: CT ANGIOGRAPHY CHEST WITH CONTRAST TECHNIQUE: Multidetector CT imaging of the chest was performed using the standard protocol during bolus administration of intravenous contrast. Multiplanar CT image reconstructions and MIPs were obtained to evaluate the vascular anatomy. RADIATION DOSE REDUCTION: This exam was performed according to the departmental dose-optimization program which includes automated exposure control, adjustment of the mA and/or kV according to patient size and/or use of iterative reconstruction technique. CONTRAST:  133m OMNIPAQUE IOHEXOL 350 MG/ML SOLN COMPARISON:  Chest radiograph done earlier today FINDINGS: Cardiovascular: Contrast density in  the  thoracic aorta is less than optimal to evaluate for dissection. Are no intraluminal filling defects in the central pulmonary artery branches. Evaluation of small peripheral pulmonary artery branches is limited by extensive infiltrates in both lungs. There is ectasia of main pulmonary artery measuring 3.4 cm suggesting pulmonary arterial hypertension. There is dilation of left ventricular cavity. Mediastinum/Nodes: Endotracheal tube is noted in place. Enteric tube is noted traversing the thoracic esophagus. No significant lymphadenopathy seen in mediastinum. Lungs/Pleura: Extensive patchy ground-glass densities seen in both lungs. There are patchy alveolar infiltrates in the posterior lower lung fields. Small bilateral pleural effusions are seen. There is no pneumothorax. Upper Abdomen: Tip of enteric tube is seen in the antrum of the stomach. Musculoskeletal: Degenerative changes are noted in the thoracic spine. Review of the MIP images confirms the above findings. IMPRESSION: There is no evidence of central pulmonary artery embolism. Evaluation of small peripheral branches is limited by extensive infiltrates in both lungs. There is ectasia of main pulmonary artery suggesting pulmonary arterial hypertension. Extensive patchy ground-glass densities noted in both lungs. Alveolar densities seen in the posterior lower lung fields. Findings suggest atelectasis/pneumonia and possibly pulmonary edema. Small bilateral pleural effusions are seen. Electronically Signed   By: Elmer Picker M.D.   On: 08/18/2021 12:29  ? ?MR BRAIN WO CONTRAST ? ?Result Date: 09/02/2021 ?CLINICAL DATA:  Mental status change EXAM: MRI HEAD WITHOUT CONTRAST TECHNIQUE: Multiplanar, multiecho pulse sequences of the brain and surrounding structures were obtained without intravenous contrast. COMPARISON:  06/28/2016. FINDINGS: Brain: Restricted diffusion with ADC correlates in the right-greater-than-left occipital lobe cortex (series 2,  images 16-18, 20-24), bilateral hippocampi (series 2, images 19-22), and in the bilateral cerebellar hemispheres (series 2, image 11 and 16). Additional punctate focus of restricted diffusion in the superior right parietal lobe (series 2, image 37). Increased signal on diffusion-weighted imaging in the bilateral caudate (series 2, image 30) and somewhat diffusely within the cortex, posterior more than anterior. Many of these areas are also hyperintense on FLAIR, likely reflecting cytotoxic edema. No acute hemorrhage, mass, mass effect, or midline shift. Lacunar infarct in the left pons. T2 hyperintense signal in the periventricular white matter, likely the sequela of chronic small vessel ischemic disease. No hydrocephalus or extra-axial collection. Vascular: Normal flow voids. Skull and upper cervical spine: Normal marrow signal. Sinuses/Orbits: Mucosal thickening in the ethmoid air cells. Other: Trace fluid in the mastoid air cells. IMPRESSION: 1. Restricted diffusion, most prominently in the right occipital cortex, bilateral hippocampi, and bilateral cerebellar hemispheres, but also more subtle restricted diffusion in the bilateral caudate and diffusely within the cerebral cortex, overall concerning for hypoxic ischemic injury. 2. Punctate focus of restricted diffusion in the right parietal lobe subcortical white matter may be embolic or could also be related to anoxic injury. Electronically Signed   By: Merilyn Baba M.D.   On: 09/02/2021 18:10  ? ?DG CHEST PORT 1 VIEW ? ?Result Date: 09/20/21 ?CLINICAL DATA:  Acute respiratory failure with hypoxia. EXAM: PORTABLE CHEST 1 VIEW COMPARISON:  Chest radiograph 09/03/2021 FINDINGS: Endotracheal tube terminates 3.5 cm above the carina. Enteric tube courses into the abdomen with tip not imaged. The cardiac silhouette remains mildly enlarged. Bilateral perihilar in asymmetric left basilar airspace opacities have not significantly changed. There is likely a persistent  left pleural effusion. No pneumothorax is identified. IMPRESSION: Unchanged bilateral airspace opacities suggestive of edema with atelectasis or consolidation in the left lower lobe and persistent small lef

## 2021-09-05 NOTE — Progress Notes (Signed)
? ?NAME:  Deborah Wise, MRN:  [id number], DOB:  1950/11/01, LOS: 4 ?ADMISSION DATE:  08/18/2021, CONSULTATION DATE:  3/27 ?REFERRING MD:  Alvino Chapel, CHIEF COMPLAINT:  cardiac arrest   ? ?History of Present Illness:  ?71 year old female patient with extensive history as outlined below.  Presented to the emergency room on 3/27 status post cardiac arrest.  Per report he was eating breakfast the morning of 3/27, developed rather acute onset of shortness of breath for which EMS was summoned.  On fire department arrival pulse oximetry in the 30s and only responding to painful stimulus.  Initially attempted CPAP but patient ripped it off.  He was given magnesium and Solu-Medrol by EMS given prior history of anaphylaxis.  Upon arrival to the emergency room was bradycardic and subsequently went into cardiac arrest ACLS algorithm initiated she was intubated, received epinephrine and atropine, initially cardiac rhythm appeared to be third-degree heart block.  Post resuscitation patient was with narrow complex regular tachycardia, with mild ST elevation in inferior leads. PCCM asked to admit. Cardiology at bedside. ? 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 ?Significant Hospital Events: ?Including procedures, antibiotic start and stop dates in addition to other pertinent events   ?3/27 Admit.  Hypoxic on arrival and 30s.  Bradycardic and unresponsive, third-degree heart block and cardiac arrest on arrival to ER.  Time to return of spontaneous circulation estimated at 10 minutes.  Possible inferior wall ST elevation.  Intubated.  Right femoral  central line placed, right radial A-line placed. CT head negative for acute findings.  CT angiogram of chest negative for pulmonary emboli findings consistent with pulmonary artery hypertension there was extensive patchy groundglass densities in both lungs.  She was started on IV Unasyn. ECHO w EF 35-40% left ventricle demonstrates regional wall motion abnormality left atrial size mildly dilated mild mitral valve regurg IVC dilated ?3/28 Weaned off norepinephrine.  Acid-base improved, bicarbonate infusion stopped.  Mental status still quite depressed, only able to elicit cough when suctioning otherwise no response ?3/29 Pulmonary edema on chest x-ray getting diuresis, needing 100% FIO2 and increased PEEP support. + NMB renal function slightly worse.  Mental status not improved. Added IMDUR and hydralazine.  EEG ordered. Made DNR. MRI with restricted diffusion in right occipital cortex, bilateral hippocampi, bilateral cerebellar hemispheres, concerning for hypoxic ischemic injury.  ? ?Interim History / Subjective:  ?Afebrile  ?Blood cultures pending  ?Vent - 60%, PEEP 12 (down from 18/10% yesterday) ?I/O 320 ml UOP, +3L in last 24 hours  ?No acute events overnight. MRI results & Neuro consulted reviewed.  ? ?Objective   ?Blood pressure 124/63, pulse 81, temperature 98.1 ?F (36.7 ?C), resp. rate (!) 32, height '5\' 4"'$  (1.626 m), weight 86.1 kg, SpO2 100 %. ?   ?Vent Mode: PRVC ?FiO2 (%):  [60 %-80 %] 60 % ?Set Rate:  [22 bmp] 22 bmp ?Vt Set:  [430 mL] 430 mL ?PEEP:  [12 cmH20-18 cmH20] 12 cmH20 ?Plateau Pressure:  [30 LHT34-28 cmH20] 30 cmH20  ? ?Intake/Output Summary (Last 24 hours) at September 22, 2021 0817 ?Last data filed at 2021/09/22 0400 ?Gross per 24 hour  ?Intake 3157.59 ml  ?Output 275 ml  ?  Net 2882.59 ml  ? ?Filed Weights  ? 09/02/2021 1000 09/01/21 0500 09/03/21 0500  ?Weight: 77.1 kg 84.5 kg 86.1 kg  ? ? ?Examination: ?General: critically ill appearing adult female lying in bed in NAD on vent   ?HEENT: MM pink/moist,  ETT, anicteric ?Neuro: on propofol/fentanyl, no response to verbal stimuli, eyelid flickering to sternal rub ?CV: s1s2 RRR, no m/r/g ?PULM: non-labored at rest, lungs bilaterally with rhonchi ?GI: soft, bsx4 active  ?Extremities: warm/dry, trace to 1+ generalized edema  ?Skin: no rashes or lesions ? ?Resolved Hospital Problem list   ?Cardiogenic shock resolved ?Metabolic acidosis ?Lactic acidosis  ? ?Assessment & Plan:  ? ?Cardiac Arrest, seemingly more likely 2/2 ACS  ?CT chest neg for PE. Conduction delay, LVH on EKG.  Troponin [id number] ?-tele monitoring  ?-continue heparin infusion per pharmacy ? ?AF w/ RVR.  ?Hx CAD, HTN, PVD ?Had brief CHB. Rhythm has been rapid and irregular since ROSC.  Hx 75% proximal RCA stenosis ?-rate control  ?-continue amiodarone  ?-heparin as above  ? ?Acute Systolic and Diastolic Heart Failure (new dx) EF 34-40% ?-tele monitoring  ?-continue imdur, hydralzaine ?-appreciate Cardiology ? ?Acute Hypoxic Respiratory Failure w/ ARDS and Pulmonary Edema c/b Aspiration PNA ?-PRVC 8cc/kg  ?-mental status precludes weaning  ?-wean PEEP / fiO2 for sats >90% ?-D5 unasyn  ?-VAP prevention measures  ?-follow CXR intermittently ? 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 Initial CT head negative.  MRI worrisome for anoxic injury.  ?-supportive care ?-MRI reviewed, consistent with severe anoxic injury ?-Appreciate Neurology evaluation  ? ?AKI ?Normal renal function in 06/2021.   ?-would not be an HD candidate due to neuro injury  ?-Trend BMP / urinary output ?-Replace electrolytes as indicated ?-Avoid nephrotoxic agents, ensure adequate renal perfusion ? ?Fluid and electrolyte balance: hyponatremia ?-monitor, replace as indicated ? ?DM with Hyperglycemia ?-SSI ?-semglee 12 units QD ?-add TF coverage  ?-goal glucose 140-180 ? ?History of Cirrhosis, felt secondary to Hep C+ ?-trend LFT's ?-universal precautions   ? ?Thrombocytopenia ?Anemia ?-trend CBC  ?-transfuse for hgb <7% ? ?Best Practice (right click and "Reselect all SmartList Selections" daily)  ?Diet/type: tubefeeds ?DVT prophylaxis: systemic heparin ?GI prophylaxis: PPI ?Lines: Central line ?Foley:  Yes, and it is still needed ?Code Status:  full code ?Last date of multidisciplinary goals of care discussion: Niece updated at bedside 3/31.  Will follow up when children/husband arrive for further discussion.  ?  ?Critical Care Time: 34 minutes   ? ?Noe Gens, MSN, APRN, NP-C, AGACNP-BC ?[facility] ?03-Oct-2021, 8:17 AM ? ? ?Please see [email or web address] for pager details.  ? ?From 7A-7P if no response, please call [phone] ?After hours, please call Warren Lacy [phone] ? ? ? ? ? ? ?

## 2021-09-05 NOTE — Progress Notes (Signed)
Patient was compassionately extubated to room air with CCM NP & RN at the bedside. Family was brought back into the room by CCM NP following extubation.  ?

## 2021-09-05 NOTE — Progress Notes (Signed)
eLink Physician-Brief Progress Note ?Patient Name: Deborah Wise ?DOB: [birth year] ?MRN: [id number] ? ? ?Date of Service ? 2021-09-12  ?HPI/Events of Note ? Ca ++ 0.89  ?eICU Interventions ? Calcium gluconate 2 gm iv x 1 ordered.  ? ? ? ?  ? ?Kerry Kass Lavonn Maxcy ?2021/09/12, 6:34 AM ?

## 2021-09-05 NOTE — Progress Notes (Signed)
? ?Progress Note ? ?Patient Name: Deborah Wise ?Date of Encounter: 10-03-2021 ? ?[facility] Cardiologist: Candee Furbish, MD  ? ?Subjective  ? ?Intubated and sedated.   ? ?Inpatient Medications  ?  ?Scheduled Meds: ? aspirin  81 mg Per Tube Daily  ? atorvastatin  80 mg Per Tube Daily  ? chlorhexidine gluconate (MEDLINE KIT)  15 mL Mouth Rinse BID  ? Chlorhexidine Gluconate Cloth  6 each Topical Q0600  ? feeding supplement (PROSource TF)  45 mL Per Tube Daily  ? hydrALAZINE  25 mg Per Tube Q8H  ? insulin aspart  2-6 Units Subcutaneous Q4H  ? insulin aspart  3 Units Subcutaneous Q4H  ? insulin glargine-yfgn  12 Units Subcutaneous Daily  ? ipratropium-albuterol  3 mL Nebulization Q4H  ? isosorbide dinitrate  5 mg Per Tube TID  ? mouth rinse  15 mL Mouth Rinse 10 times per day  ? pantoprazole sodium  40 mg Per Tube Daily  ? polyethylene glycol  17 g Per Tube Daily  ? sennosides  5 mL Per Tube BID  ? ?Continuous Infusions: ? sodium chloride    ? amiodarone 30 mg/hr (2021/10/03 0820)  ? dextrose Stopped (2021-10-03 5093)  ? feeding supplement (VITAL AF 1.2 CAL) 1,000 mL (2021-10-03 0406)  ? fentaNYL infusion INTRAVENOUS 50 mcg/hr (Oct 03, 2021 0516)  ? heparin 1,300 Units/hr (2021-10-03 0400)  ? linezolid (ZYVOX) IV Stopped (09/03/21 1915)  ? piperacillin-tazobactam (ZOSYN)  IV    ? propofol (DIPRIVAN) infusion 50 mcg/kg/min (2021/10/03 0759)  ? ?PRN Meds: ?sodium chloride, acetaminophen (TYLENOL) oral liquid 160 mg/5 mL, docusate, fentaNYL, fentaNYL (SUBLIMAZE) injection, labetalol, polyethylene glycol  ? ?Vital Signs  ?  ?Vitals:  ? 10/03/2021 0514 2021-10-03 0600 10-03-2021 0700 2021/10/03 0805  ?BP: (!) 112/43   124/63  ?Pulse:  62 81   ?Resp:  17 15 (!) 32  ?Temp:  98.1 ?F (36.7 ?C) 98.1 ?F (36.7 ?C)   ?TempSrc:      ?SpO2:  (!) 75% 97% 100%  ?Weight:      ?Height:      ? ? ?Intake/Output Summary (Last 24 hours) at 10-03-21 0929 ?Last data filed at October 03, 2021 0400 ?Gross per 24 hour  ?Intake 3157.59 ml  ?Output 275 ml  ?Net 2882.59  ml  ? ? ? ?  09/03/2021  ?  5:00 AM 09/01/2021  ?  5:00 AM 08/15/2021  ? 10:00 AM  ?Last 3 Weights  ?Weight (lbs) 189 lb 13.1 oz 186 lb 4.6 oz 169 lb 15.6 oz  ?Weight (kg) 86.1 kg 84.5 kg 77.1 kg  ?   ? ?Telemetry  ?  ?NSR 80- Personally Reviewed ? ?ECG  ?  ?No new ECG- Personally Reviewed ? ?Physical Exam  ? ?GEN: Intubated, sedated ?Cardiac: RRR, no murmurs ?Respiratory: Rhonchi ?GI: Soft, nontender ?MS: No edema ?Neuro:  Sedated ?Psych: Unable to assess ? ?Labs  ?  ?High Sensitivity Troponin:   ?Recent Labs  ?Lab 08/09/2021 ?0801 08/09/2021 ?1033 08/08/2021 ?1524  ?TROPONINIHS 95* 797* 3,883*  ? ?   ?Chemistry ?Recent Labs  ?Lab 08/06/2021 ?0801 08/14/2021 ?0904 09/01/21 ?1644 09/02/21 ?2671 09/02/21 ?2458 09/02/21 ?1014 09/02/21 ?1853 09/03/21 ?0415 09/03/21 ?1832 10-03-21 ?0353 2021-10-03 ?0998  ?NA 137   < > 135   < > 134*   < > 135 134* 130* 128* 126*  ?K 4.6   < > 4.6   < > 4.1   < > 4.1 4.4 4.8 5.0 4.9  ?CL 109   < > 99   < >  98  --  98 98 93* 91*  --   ?CO2 8*   < > 24   < > 25  --  24 23 22  21*  --   ?GLUCOSE 396*   < > 178*   < > 201*  --  174* 210* 233* 214*  --   ?BUN 13   < > 27*   < > 31*  --  38* 43* 49* 53*  --   ?CREATININE 1.26*   < > 1.47*   < > 1.77*  --  2.13* 2.61* 3.12* 3.49*  --   ?CALCIUM 8.9   < > 8.2*   < > 7.9*  --  7.6* 7.4* 7.2* 7.0*  --   ?MG  --    < > 2.5*  --  2.4  --  2.4  --   --   --   --   ?PROT 6.3*  --   --   --   --   --   --  5.7*  --   --   --   ?ALBUMIN 3.0*  --   --   --   --   --   --  2.3*  --   --   --   ?AST 46*  --   --   --   --   --   --  92*  --   --   --   ?ALT 18  --   --   --   --   --   --  37  --   --   --   ?ALKPHOS 79  --   --   --   --   --   --  22  --   --   --   ?BILITOT 1.0  --   --   --   --   --   --  3.1*  --   --   --   ?GFRNONAA 46*   < > 38*   < > 31*  --  25* 19* 16* 14*  --   ?ANIONGAP 20*   < > 12   < > 11  --  13 13 15  16*  --   ? < > = values in this interval not displayed.  ? ?  ?Lipids  ?Recent Labs  ?Lab 2021/09/06 ?0353  ?TRIG 76  ? ?   ?Hematology ?Recent Labs  ?Lab 09/02/21 ?0415 09/02/21 ?1014 09/03/21 ?0415 2021/09/06 ?0353 2021/09/06 ?7628  ?WBC 14.2*  --  13.2* 12.0*  --   ?RBC 3.44*  --  3.00* 2.75*  --   ?HGB 8.6*   < > 7.3* 7.0* 7.5*  ?HCT 26.4*   < > 23.6* 21.3* 22.0*  ?MCV 76.7*  --  78.7* 77.5*  --   ?MCH 25.0*  --  24.3* 25.5*  --   ?MCHC 32.6  --  30.9 32.9  --   ?RDW 18.6*  --  18.3* 18.2*  --   ?PLT 89*  --  85* 95*  --   ? < > = values in this interval not displayed.  ? ? ?Thyroid No results for input(s): TSH, FREET4 in the last 168 hours.  ?BNP ?Recent Labs  ?Lab 09/02/21 ?0415 09/03/21 ?0415 Sep 06, 2021 ?0353  ?BNP 404.0* 281.7* 291.2*  ? ?  ?DDimer  ?Recent Labs  ?Lab 08/25/2021 ?0801  ?DDIMER 3.26*  ? ?  ? ?Radiology  ?  ?MR BRAIN  WO CONTRAST ? ?Result Date: 09/02/2021 ?CLINICAL DATA:  Mental status change EXAM: MRI HEAD WITHOUT CONTRAST TECHNIQUE: Multiplanar, multiecho pulse sequences of the brain and surrounding structures were obtained without intravenous contrast. COMPARISON:  06/28/2016. FINDINGS: Brain: Restricted diffusion with ADC correlates in the right-greater-than-left occipital lobe cortex (series 2, images 16-18, 20-24), bilateral hippocampi (series 2, images 19-22), and in the bilateral cerebellar hemispheres (series 2, image 11 and 16). Additional punctate focus of restricted diffusion in the superior right parietal lobe (series 2, image 37). Increased signal on diffusion-weighted imaging in the bilateral caudate (series 2, image 30) and somewhat diffusely within the cortex, posterior more than anterior. Many of these areas are also hyperintense on FLAIR, likely reflecting cytotoxic edema. No acute hemorrhage, mass, mass effect, or midline shift. Lacunar infarct in the left pons. T2 hyperintense signal in the periventricular white matter, likely the sequela of chronic small vessel ischemic disease. No hydrocephalus or extra-axial collection. Vascular: Normal flow voids. Skull and upper cervical spine: Normal marrow  signal. Sinuses/Orbits: Mucosal thickening in the ethmoid air cells. Other: Trace fluid in the mastoid air cells. IMPRESSION: 1. Restricted diffusion, most prominently in the right occipital cortex, bilateral hippocampi, and bilateral cerebellar hemispheres, but also more subtle restricted diffusion in the bilateral caudate and diffusely within the cerebral cortex, overall concerning for hypoxic ischemic injury. 2. Punctate focus of restricted diffusion in the right parietal lobe subcortical white matter may be embolic or could also be related to anoxic injury. Electronically Signed   By: Merilyn Baba M.D.   On: 09/02/2021 18:10  ? ?DG CHEST PORT 1 VIEW ? ?Result Date: September 05, 2021 ?CLINICAL DATA:  Acute respiratory failure with hypoxia. EXAM: PORTABLE CHEST 1 VIEW COMPARISON:  Chest radiograph 09/03/2021 FINDINGS: Endotracheal tube terminates 3.5 cm above the carina. Enteric tube courses into the abdomen with tip not imaged. The cardiac silhouette remains mildly enlarged. Bilateral perihilar in asymmetric left basilar airspace opacities have not significantly changed. There is likely a persistent left pleural effusion. No pneumothorax is identified. IMPRESSION: Unchanged bilateral airspace opacities suggestive of edema with atelectasis or consolidation in the left lower lobe and persistent small left pleural effusion. Electronically Signed   By: Logan Bores M.D.   On: 2021-09-05 07:59  ? ?DG Chest Port 1 View ? ?Result Date: 09/03/2021 ?CLINICAL DATA:  71 year old female with atrial fibrillation, RVR. Pulmonary edema. EXAM: PORTABLE CHEST 1 VIEW COMPARISON:  09/02/2021 and earlier. FINDINGS: Portable AP semi upright view at 0538 hours. Endotracheal tube tip remains in good position between the clavicles and carina. Enteric tube courses to the abdomen, tip not included. Stable lung volumes and mediastinal contours with up to mild cardiomegaly. Regressed but not resolved confluent perihilar opacity and left lung base  veiling opacity. No pneumothorax. No air bronchograms. No areas of worsening ventilation. Stable visualized osseous structures. IMPRESSION: 1. Stable lines and tubes. 2. Improved ventilation with regressed but not resolved

## 2021-09-05 NOTE — Plan of Care (Signed)
?  Interdisciplinary Goals of Care Family Meeting ? ? ?Date carried out:: 09/08/21 ? ?Location of the meeting: Conference room ? ?Member's involved: Nurse Practitioner and Family Member or next of kin ? ?Durable Power of Attorney or acting medical decision maker: Gregary Signs (husband), daughters x2, niece and pastor.  ? ?Discussion: We discussed goals of care for Deborah Wise . Further discussed patients anoxic brain injury as seen on MRI.  They previously reviewed with Neurology. Family indicates they would not want the patient to suffer & feel we should transition to comfort measures.  Support offered.   ? ?Code status: Full DNR ? ?Disposition: In-patient comfort care ? ? ?Time spent for the meeting: 35 minutes  ? ? ?Noe Gens, MSN, APRN, NP-C, AGACNP-BC ?[facility] ?Sep 08, 2021, 12:42 PM ? ? ?Please see [email or web address] for pager details.  ? ?From 7A-7P if no response, please call [phone] ?After hours, please call ELink [phone] ? ?

## 2021-09-05 NOTE — Consult Note (Addendum)
[name] KitchenANTICOAGULATION CONSULT NOTE  ? ?Pharmacy Consult for Heparin ?Indication: ACS / Afib RVR  ? ?No Known Allergies ? ?Patient Measurements: ?Height: '5\' 4"'$  (162.6 cm) ?Weight: 86.1 kg (189 lb 13.1 oz) ?IBW/kg (Calculated) : 54.7 ?Heparin Dosing Weight: 70.9kg ? ?Vital Signs: ?Temp: 98.1 ?F (36.7 ?C) (03/31 0700) ?BP: 124/63 (03/31 0805) ?Pulse Rate: 81 (03/31 0700) ? ?Labs: ?Recent Labs  ?  09/02/21 ?0415 09/02/21 ?1014 09/02/21 ?1853 09/03/21 ?0415 09/03/21 ?1832 09-30-21 ?0353 2021/09/30 ?1610  ?HGB 8.6*   < >  --  7.3*  --  7.0* 7.5*  ?HCT 26.4*   < >  --  23.6*  --  21.3* 22.0*  ?PLT 89*  --   --  85*  --  95*  --   ?HEPARINUNFRC 0.19*  --  0.24* 0.31  --  0.44  --   ?CREATININE 1.77*  --  2.13* 2.61* 3.12* 3.49*  --   ? < > = values in this interval not displayed.  ? ? ? ?Estimated Creatinine Clearance: 16.2 mL/min (A) (by C-G formula based on SCr of 3.49 mg/dL (H)). ? ? ?Medical History: ?Past Medical History:  ?Diagnosis Date  ? Bilateral carotid artery stenosis   ? vascular-- Dagoberto Ligas PA --- last duplex in epic 10-06-2020 right ICA 60-79% and left ICA 40-59%  ? Bladder outlet obstruction   ? self caths  ? Bladder wall thickening   ? Blood transfusion without reported diagnosis   ? Cardiac arrest ([place]) 09/03/2021  ? Cardiogenic shock ([place]) 08/14/2021  ? CHB (complete heart block) ([place]) 09/01/2021  ? Coronary artery disease   ? cardiologist--- dr Gwenlyn Found---- cath 03-25-2020 showed proxRCA 75%, distal RCA 50%, and midLAD 60%,  managed medically  ? Detrusor overactivity   ? urinary bladder  ? Esophageal varices ([facility])   ? followed by dr Henrene Pastor---  nonbleeding  ? Fibroid, uterine   ? Full dentures   ? Hepatic cirrhosis due to chronic hepatitis C infection ([place])   ? followed by [place] NP---  s/p treated chronic hep c with cure and complicated by portal hypertension and esophageal varies  ? History of CVA (cerebrovascular accident) without residual deficits 06/2016  ? neurologist--- Frann Rider---  hospital admission 06-27-2016 right [place] infart with  left MCA aneurysm,  no residual per pt  ? History of hepatitis C   ? post treatement and cured  ? Hyperlipidemia   ? Hypertension   ? IDA (iron deficiency anemia)   ? 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 left MCA ;  followed by neurologist  ? Peripheral vascular disease ([place])   ? Self-catheterizes urinary bladder   ? Stroke [facility]) 2018  ? Thrombocytopenia ([place]) 12/25/2014  ? Type 2 diabetes mellitus ([place])   ? followed by pcp---- (01-14-2021 pt stated check blood sugar at home twice weekly fasting, did not know average)  ? Vaginal atrophy   ? ?Assessment: ?75 YOF brought in by EMS with acute onset SOB and subsequent cardiac arrest after arrival to ED. ACLS performed ~10 minutes before ROSC. PMH significant for cirrhosis, esophageal varices (no GIB), CVA, CAD, DM, HTN, HLD, PVD, non-ruptured cerebral aneurysm neuroendocrine tumor, and EtOH+cocaine abuse. No anticoagulation prior to admission. Pharmacy consulted for heparin.   ? ?Heparin level is up to 0.44 at goal on heparin drip rate 1300 units/hr but now at the upper end of goal. PLTC low stable 90s hgb low 7.5 but stable, will follow trend closely.  No bleeding noted   ?  Currently in SR on IV amiodarone ? ?Goal of Therapy:  ?Heparin level 0.3-0.5 - low end goal given cirrhosis  ?Monitor platelets by anticoagulation protocol: Yes ?  ?Plan:  ?Reduce heparin infusion slightly to 1250 units/hr to keep at lower end of goal ?Monitor daily heparin level, CBC ?Monitor for signs/symptoms of bleeding  ? ?Erin Hearing PharmD., BCPS ?Clinical Pharmacist ?09/13/2021 9:21 AM ? ?

## 2021-09-05 DEATH — deceased

## 2021-09-16 ENCOUNTER — Other Ambulatory Visit (HOSPITAL_BASED_OUTPATIENT_CLINIC_OR_DEPARTMENT_OTHER): Payer: Commercial Managed Care - HMO

## 2021-10-05 ENCOUNTER — Ambulatory Visit: Payer: Commercial Managed Care - HMO | Admitting: Cardiology

## 2021-10-05 ENCOUNTER — Telehealth: Payer: Commercial Managed Care - HMO

## 2021-10-07 ENCOUNTER — Telehealth: Payer: Commercial Managed Care - HMO

## 2021-11-04 ENCOUNTER — Telehealth: Payer: Self-pay | Admitting: Internal Medicine

## 2021-11-04 NOTE — Telephone Encounter (Signed)
Pt husband Marcello Moores is calling and on the life insurance form he stated md put on the form his wife use tobacco product and husband states she never use tobacco product and he will mail the form back to be correct. [employer] [place] [employer] phone number is [phone]

## 2021-11-05 ENCOUNTER — Encounter: Payer: Self-pay | Admitting: Internal Medicine

## 2021-11-12 NOTE — Telephone Encounter (Signed)
Mr Deborah Wise is aware.  Original mailed to home address per Mr Hopper's request.

## 2021-12-29 ENCOUNTER — Ambulatory Visit: Payer: Commercial Managed Care - HMO | Admitting: Obstetrics and Gynecology
